# Patient Record
Sex: Female | Born: 1975
Health system: Southern US, Community
[De-identification: ages and names within clinical notes are randomized; demographics above are authoritative.]

## PROBLEM LIST (undated history)

## (undated) ENCOUNTER — Encounter

## (undated) ENCOUNTER — Encounter: Attending: Rheumatology | Primary: Rheumatology

## (undated) ENCOUNTER — Ambulatory Visit: Attending: Family Medicine | Primary: Family Medicine

## (undated) ENCOUNTER — Ambulatory Visit
Payer: MEDICARE | Attending: Student in an Organized Health Care Education/Training Program | Primary: Student in an Organized Health Care Education/Training Program

## (undated) ENCOUNTER — Telehealth

## (undated) ENCOUNTER — Institutional Professional Consult (permissible substitution): Attending: Family Medicine | Primary: Family Medicine

## (undated) ENCOUNTER — Encounter
Attending: Student in an Organized Health Care Education/Training Program | Primary: Student in an Organized Health Care Education/Training Program

## (undated) ENCOUNTER — Encounter: Attending: Family Medicine | Primary: Family Medicine

## (undated) ENCOUNTER — Telehealth
Attending: Student in an Organized Health Care Education/Training Program | Primary: Student in an Organized Health Care Education/Training Program

## (undated) ENCOUNTER — Ambulatory Visit

## (undated) ENCOUNTER — Telehealth: Attending: Ambulatory Care | Primary: Ambulatory Care

## (undated) ENCOUNTER — Encounter: Attending: Sports Medicine | Primary: Sports Medicine

## (undated) ENCOUNTER — Encounter: Attending: Family | Primary: Family

## (undated) ENCOUNTER — Encounter: Attending: Internal Medicine | Primary: Internal Medicine

## (undated) ENCOUNTER — Telehealth: Attending: Rheumatology | Primary: Rheumatology

## (undated) ENCOUNTER — Ambulatory Visit: Attending: Rheumatology | Primary: Rheumatology

## (undated) ENCOUNTER — Ambulatory Visit
Payer: Medicaid (Managed Care) | Attending: Student in an Organized Health Care Education/Training Program | Primary: Student in an Organized Health Care Education/Training Program

## (undated) ENCOUNTER — Ambulatory Visit: Attending: Pharmacist | Primary: Pharmacist

## (undated) ENCOUNTER — Ambulatory Visit: Attending: Orthopaedic Surgery | Primary: Orthopaedic Surgery

## (undated) ENCOUNTER — Ambulatory Visit: Attending: Sports Medicine | Primary: Sports Medicine

## (undated) ENCOUNTER — Ambulatory Visit
Attending: Student in an Organized Health Care Education/Training Program | Primary: Student in an Organized Health Care Education/Training Program

## (undated) DIAGNOSIS — M51369 Other intervertebral disc degeneration, lumbar region without mention of lumbar back pain or lower extremity pain: Secondary | ICD-10-CM

## (undated) DIAGNOSIS — F329 Major depressive disorder, single episode, unspecified: Secondary | ICD-10-CM

## (undated) DIAGNOSIS — Z1589 Genetic susceptibility to other disease: Secondary | ICD-10-CM

## (undated) DIAGNOSIS — M0520 Rheumatoid vasculitis with rheumatoid arthritis of unspecified site: Secondary | ICD-10-CM

## (undated) DIAGNOSIS — E119 Type 2 diabetes mellitus without complications: Secondary | ICD-10-CM

## (undated) DIAGNOSIS — M549 Dorsalgia, unspecified: Secondary | ICD-10-CM

## (undated) DIAGNOSIS — F419 Anxiety disorder, unspecified: Secondary | ICD-10-CM

## (undated) DIAGNOSIS — G8929 Other chronic pain: Secondary | ICD-10-CM

## (undated) DIAGNOSIS — G9332 Myalgic encephalomyelitis/chronic fatigue syndrome: Secondary | ICD-10-CM

## (undated) DIAGNOSIS — M5136 Other intervertebral disc degeneration, lumbar region: Secondary | ICD-10-CM

## (undated) DIAGNOSIS — M1990 Unspecified osteoarthritis, unspecified site: Secondary | ICD-10-CM

## (undated) DIAGNOSIS — M797 Fibromyalgia: Secondary | ICD-10-CM

## (undated) DIAGNOSIS — F32A Depression, unspecified: Secondary | ICD-10-CM

## (undated) DIAGNOSIS — E7212 Methylenetetrahydrofolate reductase deficiency: Secondary | ICD-10-CM

## (undated) DIAGNOSIS — F319 Bipolar disorder, unspecified: Secondary | ICD-10-CM

## (undated) DIAGNOSIS — IMO0001 Reserved for inherently not codable concepts without codable children: Secondary | ICD-10-CM

## (undated) DIAGNOSIS — R5382 Chronic fatigue, unspecified: Secondary | ICD-10-CM

## (undated) HISTORY — DX: Type 2 diabetes mellitus without complications: E11.9

## (undated) HISTORY — PX: TONSILLECTOMY: SUR1361

## (undated) HISTORY — DX: Fibromyalgia: M79.7

## (undated) HISTORY — DX: Chronic fatigue, unspecified: R53.82

## (undated) HISTORY — DX: Reserved for inherently not codable concepts without codable children: IMO0001

## (undated) HISTORY — DX: Myalgic encephalomyelitis/chronic fatigue syndrome: G93.32

## (undated) SURGERY — COLONOSCOPY WITH PROPOFOL
Anesthesia: Monitor Anesthesia Care

---
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
## 2005-03-22 HISTORY — PX: OTHER SURGICAL HISTORY: SHX169

## 2005-04-02 ENCOUNTER — Ambulatory Visit (HOSPITAL_BASED_OUTPATIENT_CLINIC_OR_DEPARTMENT_OTHER): Admission: RE | Admit: 2005-04-02 | Discharge: 2005-04-02 | Payer: Self-pay | Admitting: Orthopedic Surgery

## 2006-10-02 ENCOUNTER — Emergency Department (HOSPITAL_COMMUNITY): Admission: EM | Admit: 2006-10-02 | Discharge: 2006-10-02 | Payer: Self-pay | Admitting: Emergency Medicine

## 2006-10-14 ENCOUNTER — Encounter: Admission: RE | Admit: 2006-10-14 | Discharge: 2006-10-14 | Payer: Self-pay | Admitting: Sports Medicine

## 2007-05-04 ENCOUNTER — Encounter: Admission: RE | Admit: 2007-05-04 | Discharge: 2007-05-04 | Payer: Self-pay | Admitting: Orthopedic Surgery

## 2007-06-22 LAB — CONVERTED CEMR LAB

## 2007-12-20 ENCOUNTER — Encounter: Payer: Self-pay | Admitting: Family Medicine

## 2007-12-20 LAB — CONVERTED CEMR LAB
Cholesterol: 123 mg/dL
HDL: 37 mg/dL
LDL Cholesterol: 71 mg/dL
Triglycerides: 73 mg/dL

## 2008-02-08 ENCOUNTER — Encounter: Payer: Self-pay | Admitting: Family Medicine

## 2008-02-10 ENCOUNTER — Ambulatory Visit: Payer: Self-pay | Admitting: Family Medicine

## 2008-02-10 DIAGNOSIS — IMO0001 Reserved for inherently not codable concepts without codable children: Secondary | ICD-10-CM | POA: Insufficient documentation

## 2008-02-10 DIAGNOSIS — M47816 Spondylosis without myelopathy or radiculopathy, lumbar region: Secondary | ICD-10-CM | POA: Insufficient documentation

## 2008-02-10 DIAGNOSIS — M5137 Other intervertebral disc degeneration, lumbosacral region: Secondary | ICD-10-CM

## 2008-02-10 DIAGNOSIS — R7989 Other specified abnormal findings of blood chemistry: Secondary | ICD-10-CM | POA: Insufficient documentation

## 2008-02-10 DIAGNOSIS — M5136 Other intervertebral disc degeneration, lumbar region: Secondary | ICD-10-CM | POA: Insufficient documentation

## 2008-02-10 DIAGNOSIS — F519 Sleep disorder not due to a substance or known physiological condition, unspecified: Secondary | ICD-10-CM | POA: Insufficient documentation

## 2008-02-10 DIAGNOSIS — M48061 Spinal stenosis, lumbar region without neurogenic claudication: Secondary | ICD-10-CM | POA: Insufficient documentation

## 2008-02-10 DIAGNOSIS — E039 Hypothyroidism, unspecified: Secondary | ICD-10-CM | POA: Insufficient documentation

## 2008-02-11 ENCOUNTER — Telehealth: Payer: Self-pay | Admitting: Family Medicine

## 2008-02-17 ENCOUNTER — Encounter: Payer: Self-pay | Admitting: Family Medicine

## 2008-03-24 ENCOUNTER — Ambulatory Visit: Payer: Self-pay | Admitting: Family Medicine

## 2008-03-24 DIAGNOSIS — J019 Acute sinusitis, unspecified: Secondary | ICD-10-CM | POA: Insufficient documentation

## 2008-05-05 ENCOUNTER — Encounter: Payer: Self-pay | Admitting: Family Medicine

## 2008-05-05 ENCOUNTER — Ambulatory Visit: Payer: Self-pay | Admitting: Family Medicine

## 2008-05-05 DIAGNOSIS — R5381 Other malaise: Secondary | ICD-10-CM | POA: Insufficient documentation

## 2008-05-05 DIAGNOSIS — R5383 Other fatigue: Secondary | ICD-10-CM | POA: Insufficient documentation

## 2008-05-09 LAB — CONVERTED CEMR LAB
CMV IgM: <0.9 (ref <0.90)
Cytomegalovirus Ab-IgG: 4.12 — ABNORMAL HIGH (ref <0.91)
EBV NA IgG: 0.13
EBV VCA IgG: 0.1
EBV VCA IgM: 0.31
Iron: 136 ug/dL (ref 42–145)
Saturation Ratios: 39 % (ref 20–55)
TIBC: 352 ug/dL (ref 250–470)
UIBC: 216 ug/dL
Vit D, 1,25-Dihydroxy: 41 (ref 30–89)

## 2008-05-17 ENCOUNTER — Telehealth: Payer: Self-pay | Admitting: Family Medicine

## 2008-05-24 ENCOUNTER — Emergency Department (HOSPITAL_COMMUNITY): Admission: EM | Admit: 2008-05-24 | Discharge: 2008-05-24 | Payer: Self-pay | Admitting: Emergency Medicine

## 2008-06-05 ENCOUNTER — Ambulatory Visit: Payer: Self-pay | Admitting: Family Medicine

## 2008-06-05 ENCOUNTER — Telehealth: Payer: Self-pay | Admitting: Family Medicine

## 2008-06-05 DIAGNOSIS — L259 Unspecified contact dermatitis, unspecified cause: Secondary | ICD-10-CM | POA: Insufficient documentation

## 2008-07-28 ENCOUNTER — Telehealth: Payer: Self-pay | Admitting: Family Medicine

## 2008-07-28 ENCOUNTER — Ambulatory Visit: Payer: Self-pay | Admitting: Family Medicine

## 2008-07-28 ENCOUNTER — Encounter: Admission: RE | Admit: 2008-07-28 | Discharge: 2008-07-28 | Payer: Self-pay | Admitting: Family Medicine

## 2008-07-28 DIAGNOSIS — M25579 Pain in unspecified ankle and joints of unspecified foot: Secondary | ICD-10-CM | POA: Insufficient documentation

## 2008-07-31 LAB — CONVERTED CEMR LAB: Sed Rate: 3 mm/hr (ref 0–22)

## 2008-09-21 ENCOUNTER — Ambulatory Visit: Payer: Self-pay | Admitting: Family Medicine

## 2008-10-13 ENCOUNTER — Ambulatory Visit: Payer: Self-pay | Admitting: Family Medicine

## 2008-10-13 DIAGNOSIS — M549 Dorsalgia, unspecified: Secondary | ICD-10-CM | POA: Insufficient documentation

## 2008-10-19 ENCOUNTER — Telehealth: Payer: Self-pay | Admitting: Family Medicine

## 2008-12-05 ENCOUNTER — Telehealth: Payer: Self-pay | Admitting: Family Medicine

## 2008-12-19 ENCOUNTER — Telehealth: Payer: Self-pay | Admitting: Family Medicine

## 2009-01-17 ENCOUNTER — Ambulatory Visit: Payer: Self-pay | Admitting: Family Medicine

## 2009-01-17 DIAGNOSIS — J069 Acute upper respiratory infection, unspecified: Secondary | ICD-10-CM | POA: Insufficient documentation

## 2009-01-17 LAB — CONVERTED CEMR LAB
Bilirubin Urine: NEGATIVE
Blood in Urine, dipstick: NEGATIVE
Glucose, Urine, Semiquant: NEGATIVE
Ketones, urine, test strip: NEGATIVE
Nitrite: NEGATIVE
Protein, U semiquant: NEGATIVE
Rapid Strep: NEGATIVE
Specific Gravity, Urine: <1.005
Urobilinogen, UA: NEGATIVE
WBC Urine, dipstick: NEGATIVE
pH: 5

## 2009-01-19 ENCOUNTER — Telehealth: Payer: Self-pay | Admitting: Family Medicine

## 2009-01-24 ENCOUNTER — Telehealth: Payer: Self-pay | Admitting: Family Medicine

## 2009-02-08 ENCOUNTER — Encounter: Payer: Self-pay | Admitting: Family Medicine

## 2009-03-08 ENCOUNTER — Ambulatory Visit: Payer: Self-pay | Admitting: Family Medicine

## 2009-03-28 ENCOUNTER — Ambulatory Visit: Payer: Self-pay | Admitting: Family Medicine

## 2009-03-28 DIAGNOSIS — S91309A Unspecified open wound, unspecified foot, initial encounter: Secondary | ICD-10-CM | POA: Insufficient documentation

## 2009-03-28 LAB — CONVERTED CEMR LAB: Rapid Strep: NEGATIVE

## 2009-03-30 ENCOUNTER — Telehealth: Payer: Self-pay | Admitting: Family Medicine

## 2009-03-30 ENCOUNTER — Encounter: Payer: Self-pay | Admitting: Family Medicine

## 2009-04-02 DIAGNOSIS — A689 Relapsing fever, unspecified: Secondary | ICD-10-CM | POA: Insufficient documentation

## 2009-04-02 LAB — CONVERTED CEMR LAB
Basophils Absolute: 0.1 10*3/uL (ref 0.0–0.1)
Basophils Relative: 3 % — ABNORMAL HIGH (ref 0–1)
Eosinophils Absolute: 0.1 10*3/uL (ref 0.0–0.7)
Eosinophils Relative: 1 % (ref 0–5)
HCT: 40.1 % (ref 36.0–46.0)
Hemoglobin: 13.5 g/dL (ref 12.0–15.0)
Lymphocytes Relative: 53 % — ABNORMAL HIGH (ref 12–46)
Lymphs Abs: 2.1 10*3/uL (ref 0.7–4.0)
MCHC: 33.7 g/dL (ref 30.0–36.0)
MCV: 91.8 fL (ref 78.0–100.0)
Monocytes Absolute: 0.7 10*3/uL (ref 0.1–1.0)
Monocytes Relative: 17 % — ABNORMAL HIGH (ref 3–12)
Neutro Abs: 1 10*3/uL — ABNORMAL LOW (ref 1.7–7.7)
Neutrophils Relative %: 26 % — ABNORMAL LOW (ref 43–77)
Platelets: 122 10*3/uL — ABNORMAL LOW (ref 150–400)
RBC: 4.37 M/uL (ref 3.87–5.11)
RDW: 13.5 % (ref 11.5–15.5)
WBC: 3.9 10*3/uL — ABNORMAL LOW (ref 4.0–10.5)

## 2009-04-03 ENCOUNTER — Encounter: Payer: Self-pay | Admitting: Family Medicine

## 2009-04-03 LAB — CONVERTED CEMR LAB
Basophils Absolute: 0.1 10*3/uL (ref 0.0–0.1)
Basophils Relative: 3 % — ABNORMAL HIGH (ref 0–1)
Bilirubin Urine: NEGATIVE
CMV IgM: 37.8 — ABNORMAL HIGH (ref <30.0)
Eosinophils Absolute: 0 10*3/uL (ref 0.0–0.7)
Eosinophils Relative: 1 % (ref 0–5)
HCT: 38.2 % (ref 36.0–46.0)
Hemoglobin, Urine: NEGATIVE
Hemoglobin: 12.5 g/dL (ref 12.0–15.0)
Ketones, ur: NEGATIVE mg/dL
Leukocytes, UA: NEGATIVE
Lymphocytes Relative: 65 % — ABNORMAL HIGH (ref 12–46)
Lymphs Abs: 2.8 10*3/uL (ref 0.7–4.0)
MCHC: 32.7 g/dL (ref 30.0–36.0)
MCV: 92.7 fL (ref 78.0–100.0)
Monocytes Absolute: 0.5 10*3/uL (ref 0.1–1.0)
Monocytes Relative: 12 % (ref 3–12)
Neutro Abs: 0.8 10*3/uL — ABNORMAL LOW (ref 1.7–7.7)
Neutrophils Relative %: 20 % — ABNORMAL LOW (ref 43–77)
Nitrite: NEGATIVE
Platelets: 149 10*3/uL — ABNORMAL LOW (ref 150–400)
Protein, ur: NEGATIVE mg/dL
RBC: 4.12 M/uL (ref 3.87–5.11)
RDW: 14.1 % (ref 11.5–15.5)
Specific Gravity, Urine: 1.017 (ref 1.005–1.030)
Urine Glucose: NEGATIVE mg/dL
Urobilinogen, UA: 1 (ref 0.0–1.0)
WBC: 4.3 10*3/uL (ref 4.0–10.5)
pH: 6.5 (ref 5.0–8.0)

## 2009-04-20 ENCOUNTER — Encounter: Payer: Self-pay | Admitting: Family Medicine

## 2009-05-18 ENCOUNTER — Telehealth: Payer: Self-pay | Admitting: Family Medicine

## 2009-09-10 ENCOUNTER — Telehealth: Payer: Self-pay | Admitting: Family Medicine

## 2009-10-29 ENCOUNTER — Telehealth: Payer: Self-pay | Admitting: Family Medicine

## 2009-10-31 ENCOUNTER — Encounter: Payer: Self-pay | Admitting: Family Medicine

## 2010-01-02 ENCOUNTER — Telehealth: Payer: Self-pay | Admitting: Family Medicine

## 2010-03-18 ENCOUNTER — Telehealth: Payer: Self-pay | Admitting: Family Medicine

## 2010-06-05 ENCOUNTER — Telehealth: Payer: Self-pay | Admitting: Family Medicine

## 2010-09-10 ENCOUNTER — Telehealth: Payer: Self-pay | Admitting: Family Medicine

## 2010-10-10 ENCOUNTER — Telehealth: Payer: Self-pay | Admitting: Family Medicine

## 2010-11-04 ENCOUNTER — Telehealth: Payer: Self-pay | Admitting: Family Medicine

## 2010-11-13 ENCOUNTER — Encounter: Payer: Self-pay | Admitting: Family Medicine

## 2010-12-22 DIAGNOSIS — IMO0001 Reserved for inherently not codable concepts without codable children: Secondary | ICD-10-CM

## 2010-12-22 HISTORY — DX: Reserved for inherently not codable concepts without codable children: IMO0001

## 2011-01-07 ENCOUNTER — Telehealth: Payer: Self-pay | Admitting: Family Medicine

## 2011-01-21 NOTE — Progress Notes (Signed)
  Phone Note Refill Request Message from:  Patient on March 18, 2010 11:31 AM  Refills Requested: Medication #1:  ADDERALL XR 10 MG XR24H-CAP Take 1-2  tablets by mouth once a day qAM   Supply Requested: 1 month  Method Requested: Pick up at Office Initial call taken by: Kathlene November,  March 18, 2010 11:32 AM    Prescriptions: ADDERALL XR 10 MG XR24H-CAP (AMPHETAMINE-DEXTROAMPHETAMINE) Take 1-2  tablets by mouth once a day qAM  #60 x 0   Entered and Authorized by:   Nani Gasser MD   Signed by:   Nani Gasser MD on 03/18/2010   Method used:   Print then Give to Patient   RxID:   [id number]

## 2011-01-21 NOTE — Progress Notes (Signed)
Summary: Adderall refill request and prior auth needed  Phone Note Refill Request Message from:  Patient on November 04, 2010 4:44 PM  Refills Requested: Medication #1:  ADDERALL XR 10 MG XR24H-CAP Take 1-2  tablets by mouth once a day qAM   Dosage confirmed as above?Dosage Confirmed   Brand Name Necessary? No pt states that she needs a med refill for Adderall and will require a prior Authorization.Call pt at [phone] when this is ready for pick up...Marland KitchenMichaelle Copas  November 04, 2010 4:45 PM   Initial call taken by: Michaelle Copas,  November 04, 2010 4:45 PM  Follow-up for Phone Call        called [phone] and case num is [id number] Follow-up by: Avon Gully CMA, Duncan Dull),  November 12, 2010 11:55 AM  Additional Follow-up for Phone Call Additional follow up Details #1::        PA was denied. called and left message for pt Additional Follow-up by: Avon Gully CMA, Duncan Dull),  November 18, 2010 10:03 AM    Additional Follow-up for Phone Call Additional follow up Details #2::    WE can fill and she can pay out of pocket.  Follow-up by: Nani Gasser MD,  November 18, 2010 10:48 AM

## 2011-01-21 NOTE — Progress Notes (Signed)
Summary: Aderall refill  Phone Note Refill Request Message from:  Patient on October 29, 2009 8:26 AM  Refills Requested: Medication #1:  ADDERALL XR 10 MG XR24H-CAP Take 1-2  tablets by mouth once a day qAM   Supply Requested: 1 month pick up in office this afternoon   Method Requested: Pick up at Office Initial call taken by: Kathlene November,  October 29, 2009 8:26 AM    Prescriptions: ADDERALL XR 10 MG XR24H-CAP (AMPHETAMINE-DEXTROAMPHETAMINE) Take 1-2  tablets by mouth once a day qAM  #60 x 0   Entered and Authorized by:   Nani Gasser MD   Signed by:   Nani Gasser MD on 10/29/2009   Method used:   Print then Give to Patient   RxID:   [id number]

## 2011-01-21 NOTE — Progress Notes (Signed)
Summary: Thinks may have MS  Phone Note Call from Patient Call back at Home Phone [phone]   Caller: Patient Call For: Nani Gasser MD Summary of Call: Pt says she forgot to ask you today at visit about could it be possible that she has MS? Says she has tought about it for a long time just never really addressed. KJ LPN Initial call taken by: Kathlene November,  July 28, 2008 4:29 PM  Follow-up for Phone Call        typically MS causes true weakness that come in goes in certain extremities.  At this point unlikely to have MS.   Follow-up by: Nani Gasser MD,  August 15, 2008 12:56 PM  Additional Follow-up for Phone Call Additional follow up Details #1::        LM for pt to return call. KJ LPN 09/04/77 @ 2:95AO-ZH notified of MD instructions. Pt states needs refills on some meds that originally wrote by another MD instructed pt to have pharmacy send [place] the medication refill. KJ LPN Additional Follow-up by: Kathlene November,  August 15, 2008 3:50 PM

## 2011-01-21 NOTE — Progress Notes (Signed)
Summary: Stiil with fever  Phone Note Call from Patient Call back at Home Phone [phone]   Caller: Patient Call For: Nani Gasser MD Summary of Call: Pt states still running fever at night with bodyaches and said you would get lab work on her. Initial call taken by: Kathlene November,  March 30, 2009 8:48 AM  Follow-up for Phone Call        Can go today for labs.  Follow-up by: Nani Gasser MD,  March 30, 2009 8:53 AM

## 2011-01-21 NOTE — Progress Notes (Signed)
Summary: No better  Phone Note Call from Patient Call back at Home Phone [phone]   Caller: Patient Call For: Meiling Hendriks Summary of Call: Pt seen this week and states throat is not better actual feels worse. [facility] [place]. Was told to call if no better Initial call taken by: Kathlene November,  January 19, 2009 1:21 PM    New/Updated Medications: OMNICEF 300 MG CAPS (CEFDINIR) 1 tab by mouth q 12 hrs x 10 days   Prescriptions: OMNICEF 300 MG CAPS (CEFDINIR) 1 tab by mouth q 12 hrs x 10 days  #20 x 0   Entered and Authorized by:   Seymour Bars DO   Signed by:   Seymour Bars DO on 01/19/2009   Method used:   [facility]ically to        [facility]  [address] [phone]* (retail)       [address] [place], [place]  [address]       Ph: [phone] or [phone]       Fax: [phone]   RxID:   [id number]

## 2011-01-21 NOTE — Progress Notes (Signed)
  Phone Note Outgoing Call   Summary of Call: Call pt and let her know, I reviewed her records and If coordinating care means mostly checking labs or monitoring for medication reactions then we can be of assistance.  I still don't have a avery clear picture of exactly what her physician in [place] may  need from [place] to assist in he care but we will try to accomadating as much as we feel safe to do. Initial call taken by: Nani Gasser MD,  February 11, 2008 12:35 PM  Follow-up for Phone Call        LM on VM to call office back. KJ LPN Follow-up by: Kathlene November,  February 11, 2008 12:57 PM  Additional Follow-up for Phone Call Additional follow up Details #1::        Spoke with pt and gave her MD instructions. Pt wants to know what to do about her bloodwork mainly iron and low cholesterol Additional Follow-up by: Kathlene November,  February 11, 2008 1:36 PM    Additional Follow-up for Phone Call Additional follow up Details #2::    We can recheck iron level in 6 weeks. The cholesterol is good.  The only reason it is low is because her HDL is low and this can be improved with diet and exercise.  Follow-up by: Nani Gasser MD,  February 11, 2008 2:53 PM  Additional Follow-up for Phone Call Additional follow up Details #3:: Details for Additional Follow-up Action Taken: LM forpt to call office back. KJ LPN @ 1:61WR 05/25/53 @ 4:16pm--Pt notified of MD instructions. Will see endocrinologists this Thursday then let us know. KJ LPN Additional Follow-up by: Kathlene November,  February 14, 2008 1:22 PM

## 2011-01-21 NOTE — Consult Note (Signed)
Summary: Rheumatolgy  Rheumatolgy   Imported By: Blanch Media 03/15/2008 09:50:06  _____________________________________________________________________  External Attachment:    Type:   Image     Comment:   External Document

## 2011-01-21 NOTE — Assessment & Plan Note (Signed)
Summary: Sinusitis   Vital Signs:  Patient Profile:   35 Years Old Female Height:     69 inches Weight:      151 pounds O2 Sat:      98 % Temp:     98.2 degrees F oral Pulse rate:   82 / minute BP sitting:   118 / 79  (left arm) Cuff size:   regular  Vitals Entered By: Harlene Salts (March 24, 2008 3:29 PM)                 PCP:  Cipriano Bunker  Chief Complaint:  post nasal drip, headache, sorethroat, body ache and coughing, and URI symptoms.  History of Present Illness:  URI Symptoms      This is a 35 year old woman who presents with URI symptoms.  The symptoms began 3 weeks. ago.  Start withcough adn chest tightness 3 weeks ago. Went to see her allergist. Had normal Peak Flows.  The patient reports nasal congestion, clear nasal discharge, and sore throat, but denies earache.  The patient denies fever.  The patient also reports headache.  Has had some sweats.  No Meds except for those prescribed.  No relief with the Singulair.     Current Allergies: No known allergies       Physical Exam  General:     Well-developed,well-nourished,in no acute distress; alert,appropriate and cooperative throughout examination Head:     Normocephalic and atraumatic without obvious abnormalities. No apparent alopecia or balding. Eyes:     No corneal or conjunctival inflammation noted. EOMI. Perrla. Ears:     External ear exam shows no significant lesions or deformities.  Otoscopic examination reveals clear canals, tympanic membranes are intact bilaterally without bulging, retraction, inflammation or discharge. Hearing is grossly normal bilaterally. Nose:     External nasal examination shows no deformity or inflammation. Nasal mucosa are pink and moist without lesions or exudates. Turbinates are mildy swollen.  Mouth:     Oral mucosa and oropharynx without lesions or exudates.  Teeth in good repair. Neck:     No deformities, masses, or tenderness noted. Lungs:     Normal respiratory  effort, chest expands symmetrically. Lungs are clear to auscultation, no crackles or wheezes. Heart:     Normal rate and regular rhythm. S1 and S2 normal without gallop, murmur, click, rub or other extra sounds.    Impression & Recommendations:  Problem # 1:  SINUSITIS- ACUTE-NOS (ICD-461.9)  Her updated medication list for this problem includes:    Sulfamethoxazole-tmp Ds 800-160 Mg Tab (Trimethoprim-sulfamethoxazole) .[name]... Take 1 tablet by mouth morning and night Instructed on treatment. Call if symptoms persist or worsen.   Complete Medication List: 1)  Etodolac 500 Mg Tabs (Etodolac) .... Take one tablwt by mouth twice a day 2)  Skelaxin 800 Mg Tabs (Metaxalone) .... Take one tablet by mouth once a day 3)  Flexeril 10 Mg Tabs (Cyclobenzaprine hcl) .... Take one tablet by mouth once a day prn 4)  First-testosterone Mc 2 % Crea (Testosterone propionate) 5)  Thyrolar-1/2 30 (6.25-25) Mg (mcg) Tabs (Liotrix (t3-t4)) .... Take 1 tablet by mouth once a day 6)  Sulfamethoxazole-tmp Ds 800-160 Mg Tab (Trimethoprim-sulfamethoxazole) .... Take 1 tablet by mouth morning and night     Prescriptions: SULFAMETHOXAZOLE-TMP DS 800-160 MG TAB (TRIMETHOPRIM-SULFAMETHOXAZOLE) Take 1 tablet by mouth morning and night  #20 x 0   Entered and Authorized by:   Nani Gasser MD   Signed by:  Nani Gasser MD on 03/24/2008   Method used:   [facility] to ...       [facility]  [address] [id number]       [address]       [place], [place]  [address]       Ph: [phone] or [phone]       Fax: [phone]   RxID:   [id number]  ]

## 2011-01-21 NOTE — Progress Notes (Signed)
Summary: Refills  Phone Note Refill Request   Refills Requested: Medication #1:  ADDERALL XR 10 MG XR24H-CAP Take 1-2  tablets by mouth once a day qAM  Medication #2:  CYCLOBENZAPRINE HCL 10 MG TABS 1 at bedtime as needed for sleep and 1/2 tab in AM. Initial call taken by: Payton Spark CMA,  September 10, 2010 12:16 PM    Prescriptions: CYCLOBENZAPRINE HCL 10 MG TABS (CYCLOBENZAPRINE HCL) 1 at bedtime as needed for sleep and 1/2 tab in AM.  #45 Tablet x 0   Entered and Authorized by:   Nani Gasser MD   Signed by:   Nani Gasser MD on 09/10/2010   Method used:   Printed then faxed to ...         RxID:   [id number] ADDERALL XR 10 MG XR24H-CAP (AMPHETAMINE-DEXTROAMPHETAMINE) Take 1-2  tablets by mouth once a day qAM  #60 x 0   Entered and Authorized by:   Nani Gasser MD   Signed by:   Nani Gasser MD on 09/10/2010   Method used:   Printed then faxed to ...         RxID:   [id number]
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
## 2011-01-21 NOTE — Progress Notes (Signed)
Summary: refill  Phone Note Refill Request Message from:  Patient on September 10, 2009 3:17 PM  Refills Requested: Medication #1:  ADDERALL XR 10 MG XR24H-CAP Take 1-2  tablets by mouth once a day qAM   Supply Requested: 1 month  Method Requested: Pick up at Office Initial call taken by: Kathlene November,  September 10, 2009 3:17 PM    Prescriptions: ADDERALL XR 10 MG XR24H-CAP (AMPHETAMINE-DEXTROAMPHETAMINE) Take 1-2  tablets by mouth once a day qAM  #60 x 0   Entered and Authorized by:   Nani Gasser MD   Signed by:   Nani Gasser MD on 09/10/2009   Method used:   Print then Give to Patient   RxID:   [id number]
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
## 2011-01-21 NOTE — Assessment & Plan Note (Signed)
Summary: URI, Urinary frequency   Vital Signs:  Patient Profile:   35 Years Old Female Height:     69 inches Weight:      137 pounds O2 Sat:      98 % Temp:     97.7 degrees F oral Pulse rate:   100 / minute BP sitting:   124 / 74  (left arm) Cuff size:   regular  Vitals Entered By: Harlene Salts (January 17, 2009 1:57 PM)                 PCP:  Nani Gasser MD  Chief Complaint:  swollen gland on right side of neck, white spots on tonsils, headache, body ache, and chest congestion and tightnes times 2weeks .  History of Present Illness: swollen gland on right side of neck,white spots on tonsils, headache, body ache, chest congestion and tightnes times 2 weeks. More pronounced sxs last 2 days. No known sick contacts.  No fever.   + urinary frequency. No  back pain. No dysuria or hematuria.     Current Allergies: No known allergies       Physical Exam  General:     Well-developed,well-nourished,in no acute distress; alert,appropriate and cooperative throughout examination Head:     Normocephalic and atraumatic without obvious abnormalities. No apparent alopecia or balding. Eyes:     No corneal or conjunctival inflammation noted. EOMI. Perrla.  Ears:     External ear exam shows no significant lesions or deformities.  Otoscopic examination reveals clear canals, tympanic membranes are intact bilaterally without bulging, retraction, inflammation or discharge. Hearing is grossly normal bilaterally. Nose:     External nasal examination shows no deformity or inflammation. Mouth:     Oral mucosa and oropharynx without lesions or exudates.  Teeth in good repair. Neck:     No deformities, masses, or tenderness noted. Lungs:     Normal respiratory effort, chest expands symmetrically. Lungs are clear to auscultation, no crackles or wheezes. Heart:     Normal rate and regular rhythm. S1 and S2 normal without gallop, murmur, click, rub or other extra sounds. Skin:    no rashes.   Cervical Nodes:     No lymphadenopathy noted Psych:     Cognition and judgment appear intact. Alert and cooperative with normal attention span and concentration. No apparent delusions, illusions, hallucinations    Impression & Recommendations:  Problem # 1:  URI (ICD-465.9)  Her updated medication list for this problem includes:    Feldene 10 Mg Caps (Piroxicam) .[name]... Take 1 tablet by mouth two times a day as needed Instructed on symptomatic treatment. Call if symptoms persist or worsen.  Orders: Rapid Strep (16109)   Problem # 2:  FREQUENCY, URINARY (ICD-788.41) UA neg. Call me if not feeling better into the early part of the week.  Orders: UA Dipstick w/o Micro (automated)  (81003)   Complete Medication List: 1)  First-testosterone Mc 2 % Crea (Testosterone propionate) 2)  Thyrolar-1/2 30 (6.25-25) Mg (mcg) Tabs (Liotrix (t3-t4)) .... Take 1 tablet by mouth once a day 3)  Singulair 10 Mg Tabs (Montelukast sodium) .... Take 1 tablet by mouth once a day 4)  Veramyst 27.5 Mcg/spray Susp (Fluticasone furoate) .... Two sprays/nostrils daily 5)  Feldene 10 Mg Caps (Piroxicam) .... Take 1 tablet by mouth two times a day as needed 6)  Adderall Xr 10 Mg Xr24h-cap (Amphetamine-dextroamphetamine) .... Take 1-2  tablets by mouth once a day qam 7)  Cyclobenzaprine Hcl 10  Mg Tabs (Cyclobenzaprine hcl) .[name].. 1 at bedtime as needed for sleep and 1/2 tab in am. 8)  Darvocet-n 100 100-650 Mg Tabs (Propoxyphene n-apap) .... Take 1 tablet by mouth two times a day as needed 9)  Amitriptyline Hcl 25 Mg Tabs (Amitriptyline hcl) .... Take 1 tablet by mouth once a day at bedtime    Prescriptions: ADDERALL XR 10 MG XR24H-CAP (AMPHETAMINE-DEXTROAMPHETAMINE) Take 1-2  tablets by mouth once a day qAM  #60 x 0   Entered and Authorized by:   Nani Gasser MD   Signed by:   Nani Gasser MD on 01/17/2009   Method used:   Print then Give to Patient   RxID:   [id number]  ADDERALL XR 10 MG XR24H-CAP (AMPHETAMINE-DEXTROAMPHETAMINE) Take 1 tablet by mouth once a day qAM  #30 x 0   Entered and Authorized by:   Nani Gasser MD   Signed by:   Nani Gasser MD on 01/17/2009   Method used:   Print then Give to Patient   RxID:   [id number]   Laboratory Results   Urine Tests  Date/Time Recieved: January 17, 2009 2:18 PM  Date/Time Reported: January 17, 2009 2:19 PM   Routine Urinalysis   Color: yellow Appearance: Clear Glucose: negative   (Normal Range: Negative) Bilirubin: negative   (Normal Range: Negative) Ketone: negative   (Normal Range: Negative) Spec. Gravity: <1.005   (Normal Range: 1.003-1.035) Blood: negative   (Normal Range: Negative) pH: 5.0   (Normal Range: 5.0-8.0) Protein: negative   (Normal Range: Negative) Urobilinogen: negative   (Normal Range: 0-1) Nitrite: negative   (Normal Range: Negative) Leukocyte Esterace: negative   (Normal Range: Negative)    Date/Time Received: January 17, 2009 2:18 PM  Date/Time Reported: January 17, 2009 2:18 PM   Other Tests  Rapid Strep: negative  Kit Test Internal QC: Negative   (Normal Range: Negative)

## 2011-01-21 NOTE — Progress Notes (Signed)
Summary: refill  Phone Note Refill Request Message from:  Patient on January 02, 2010 12:50 PM  Refills Requested: Medication #1:  ADDERALL XR 10 MG XR24H-CAP Take 1-2  tablets by mouth once a day qAM   Supply Requested: 1 month  Method Requested: Pick up at Office Initial call taken by: Kathlene November,  January 02, 2010 12:51 PM    Prescriptions: ADDERALL XR 10 MG XR24H-CAP (AMPHETAMINE-DEXTROAMPHETAMINE) Take 1-2  tablets by mouth once a day qAM  #60 x 0   Entered and Authorized by:   Nani Gasser MD   Signed by:   Nani Gasser MD on 01/02/2010   Method used:   Print then Give to Patient   RxID:   [id number]

## 2011-01-21 NOTE — Progress Notes (Signed)
Summary: Prior auth for Aderrall  Phone Note Call from Patient   Caller: Patient Call For: Nani Gasser MD Summary of Call: Pt called and states needs prior auth on her Aderrall. Number to call was [phone]  Case ID [id number]. [employer] and said this has been approved from 10/10/2010 to 10/31/2010.   Notified pt this was still approved until Nov 10th nad could not do a new authorization until after then. Initial call taken by: Kathlene November LPN,  October 10, 2010 1:31 PM

## 2011-01-21 NOTE — Letter (Signed)
Summary: [facility]  [facility]   Imported By: Lanelle Bal 04/12/2009 13:52:13  _____________________________________________________________________  External Attachment:    Type:   Image     Comment:   External Document

## 2011-01-21 NOTE — Progress Notes (Signed)
Summary: WANT SUBSTITUTION FOR PIROXICAM  Phone Note Call from Patient Call back at Home Phone [phone]   Caller: Patient Call For: Nani Gasser MD Summary of Call: PATIENT CALLING TO INFORM MD THAT SHE IS VERY SENSITIVE TO MEDS AND SINCE PIROXICAM IS ON BACKORDER AND SAID THAT SHE WANTS SOMETHING THAT DOESN'T MAKE HER MOUTH DRY. HAS BEEN ON TORADOL IN THE PAST.  Initial call taken by: Harlene Salts,  January 24, 2009 3:58 PM  Follow-up for Phone Call        Oral Toradol has many serious risks associated with it.  I can change her to generic Mobic. Follow-up by: Seymour Bars DO,  January 25, 2009 11:59 AM  Additional Follow-up for Phone Call Additional follow up Details #1::        Pt states will hold off on the Mobicv for now Additional Follow-up by: Kathlene November,  January 25, 2009 2:17 PM    New/Updated Medications: MELOXICAM 7.5 MG TABS (MELOXICAM) 1-2 tab by mouth daily with food   Prescriptions: MELOXICAM 7.5 MG TABS (MELOXICAM) 1-2 tab by mouth daily with food  #30 x 0   Entered and Authorized by:   Seymour Bars DO   Signed by:   Kathlene November on 01/25/2009   Method used:   Print then Give to Patient   RxID:   [id number]

## 2011-01-21 NOTE — Progress Notes (Signed)
  Prescriptions: ADDERALL XR 10 MG XR24H-CAP (AMPHETAMINE-DEXTROAMPHETAMINE) Take 1-2  tablets by mouth once a day qAM  #60 x 0   Entered by:   Kathlene November   Authorized by:   Neena Rhymes MD   Signed by:   Kathlene November on 06/05/2010   Method used:   Print then Give to Patient   RxID:   [id number]
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
## 2011-01-21 NOTE — Letter (Signed)
Summary: Old Records   Old Records   Imported By: Blanch Media 03/15/2008 13:52:30  _____________________________________________________________________  External Attachment:    Type:   Image     Comment:   External Document

## 2011-01-21 NOTE — Progress Notes (Signed)
Summary: Adderall refill  Phone Note Call from Patient Call back at Home Phone [phone]   Caller: Patient Call For: Nani Gasser MD Summary of Call: Pt calls and needs refill on the Adderall. States last time she got you were out and Dr. Cathey Endow only gave her #30 and states you usually give her #60. Explained to pt that you were here and you would write Rx as you normally would Initial call taken by: Kathlene November,  December 19, 2008 10:36 AM  Follow-up for Phone Call        Last time I filled Rx was for 30 tabs.   Follow-up by: Nani Gasser MD,  December 19, 2008 10:56 AM       Prescriptions: ADDERALL XR 10 MG XR24H-CAP (AMPHETAMINE-DEXTROAMPHETAMINE) Take 1 tablet by mouth once a day qAM  #30 x 0   Entered by:   Nani Gasser MD   Authorized by:   Kathlene November   Signed by:   Nani Gasser MD on 12/19/2008   Method used:   Print then Give to Patient   RxID:   [id number]

## 2011-01-21 NOTE — Medication Information (Signed)
Summary: Approval for Adderall/[facility]  Approval for Adderall/[facility]   Imported By: Lanelle Bal 11/13/2009 09:33:08  _____________________________________________________________________  External Attachment:    Type:   Image     Comment:   External Document

## 2011-01-21 NOTE — Progress Notes (Signed)
Summary: Dry mouth  Phone Note Call from Patient Call back at Home Phone [phone]   Caller: Patient Call For: Nani Gasser MD Summary of Call: pt calls and wants to know if there is any meds that she is taking that could cause her to have a severe dry mouth and hoarseness. Initial call taken by: Kathlene November,  October 19, 2008 1:04 PM  Follow-up for Phone Call        Flexeril and amitryptiline can both do it, maybe th combo.  Follow-up by: Nani Gasser MD,  October 19, 2008 1:08 PM  Additional Follow-up for Phone Call Additional follow up Details #1::        Pt instrructed of MD instructions. KJ LPN Additional Follow-up by: Kathlene November,  October 19, 2008 1:21 PM

## 2011-01-21 NOTE — Progress Notes (Signed)
Summary: refill  Phone Note Refill Request Message from:  Patient on May 18, 2009 9:30 AM  Refills Requested: Medication #1:  ADDERALL XR 10 MG XR24H-CAP Take 1-2  tablets by mouth once a day qAM call when ready   Method Requested: Pick up at Office Initial call taken by: Kathlene November,  May 18, 2009 9:31 AM      Prescriptions: ADDERALL XR 10 MG XR24H-CAP (AMPHETAMINE-DEXTROAMPHETAMINE) Take 1-2  tablets by mouth once a day qAM  #60 x 0   Entered and Authorized by:   Nani Gasser MD   Signed by:   Nani Gasser MD on 05/18/2009   Method used:   Print then Give to Patient   RxID:   [id number] ADDERALL XR 10 MG XR24H-CAP (AMPHETAMINE-DEXTROAMPHETAMINE) Take 1-2  tablets by mouth once a day qAM  #0 x 0   Entered and Authorized by:   Nani Gasser MD   Signed by:   Nani Gasser MD on 05/18/2009   Method used:   Print then Give to Patient   RxID:   [id number]

## 2011-01-21 NOTE — Medication Information (Signed)
Summary: [facility] for Destroamphetamine-Amph  [facility] for Destroamphetamine-Amph   Imported By: Maryln Gottron 11/28/2010 14:09:32  _____________________________________________________________________  External Attachment:    Type:   Image     Comment:   External Document
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
## 2011-01-28 ENCOUNTER — Encounter: Payer: Self-pay | Admitting: Family Medicine

## 2011-01-29 ENCOUNTER — Encounter: Payer: Self-pay | Admitting: Family Medicine

## 2011-01-29 LAB — CONVERTED CEMR LAB: Pap Smear: NORMAL

## 2011-01-31 ENCOUNTER — Encounter: Payer: Self-pay | Admitting: Family Medicine

## 2011-02-06 NOTE — Miscellaneous (Signed)
Summary: pap  Clinical Lists Changes  Observations: Added new observation of PAP SMEAR: normal (01/29/2011 7:54)      Preventive Care Screening  Pap Smear:    Date:  01/29/2011    Results:  normal

## 2011-02-18 NOTE — Letter (Signed)
Summary: [facility]  [facility]   Imported By: Maryln Gottron 02/10/2011 13:52:03  _____________________________________________________________________  External Attachment:    Type:   Image     Comment:   External Document

## 2011-03-11 NOTE — Letter (Signed)
Summary: Evaluation for Disability Determination/[facility]DS  Evaluation for Disability Determination/DDS   Imported By: Maryln Gottron 03/05/2011 09:08:51  _____________________________________________________________________  External Attachment:    Type:   Image     Comment:   External Document

## 2011-05-09 NOTE — Op Note (Signed)
NAME:  Frances [name], CLOE NO.:  [id number]   MEDICAL RECORD NO.:  [id number]          PATIENT TYPE:  AMB   LOCATION:  [facility]SC                          FACILITY:  [facility]   PHYSICIAN:  Loreta Ave, M.D. 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   OPERATIVE PROCEDURE:  Left ankle exam under anesthesia, arthroscopy, with  debridement of meniscoid lesion, partial synovectomy, and debridement of  thickening anterior talofibular ligament.   SURGEON:  Loreta Ave, M.D.   ASSISTANT:  Genene Churn. Barry Dienes, P.A.-C.   ANESTHESIA:  General with popliteal block.   SPECIMENS:  None.   CULTURES:  None.   COMPLICATIONS:  None.   DRESSINGS:  Soft compressive.   TOURNIQUET TIME:  30 minutes.   PROCEDURE:  The patient was brought to the operating room and after adequate  anesthesia had been obtained, the left ankle was examined.  Full motion, no  bony impingement, no instability.  Stirrup for support and distraction  applied.  Tourniquet applied.  Prepped and draped in the usual sterile  fashion.  Exsanguinated with elevation of Esmarch and tourniquet inflated to  300 mmHg at a thigh tourniquet.  Anterolateral and anteromedial portals of  the ankle avoiding neurovascular structures.  The ankle was entered with the  arthroscope, distended, and inspected. Synovitis globally across the front  of the ankle debrided.  Much more lateral than medial.  Articular surfaces  intact throughout.  Thickened meniscoid lesion and synovitis  anterolaterally  entrapping in the interval between the tibia and talus all debrided.  Also,  some synovitis along the syndesmosis debrided.  Thickening of the anterior  talofibular ligament with synovitis and fibrinous material all cleared from  the lateral gutter to prevent pain and impingement there.  Evidence of  previous cortisone injection into that area with some remaining crystals  within the anterior talofibular ligament.  After thorough debridement of all  recesses, the entire ankle examined including all recesses. Syndesmosis  intact.  Stability, full motion intact.  Articular cartilage intact.  Instruments and fluid had been  removed.  Portals were closed with nylon and injected with Marcaine without  epinephrine.  Sterile compressive dressing applied.  Tourniquet deflated and  removed.  Anesthesia reversed.  Brought to the recovery room. Tolerated the  surgery well without complications.      DFM/MEDQ  D:  04/02/2005  T:  04/02/2005  Job:  [id number]

## 2011-08-08 ENCOUNTER — Ambulatory Visit
Admission: RE | Admit: 2011-08-08 | Discharge: 2011-08-08 | Disposition: A | Discharge status: Home / Self Care | Payer: BC Managed Care – PPO | Source: Ambulatory Visit | Attending: Family Medicine | Admitting: Family Medicine

## 2011-08-08 ENCOUNTER — Ambulatory Visit (INDEPENDENT_AMBULATORY_CARE_PROVIDER_SITE_OTHER): Payer: BC Managed Care – PPO | Admitting: Family Medicine

## 2011-08-08 ENCOUNTER — Telehealth: Payer: Self-pay | Admitting: Family Medicine

## 2011-08-08 VITALS — BP 122/82 | HR 103 | Wt 156.0 lb

## 2011-08-08 DIAGNOSIS — M79643 Pain in unspecified hand: Secondary | ICD-10-CM

## 2011-08-08 DIAGNOSIS — M79609 Pain in unspecified limb: Secondary | ICD-10-CM

## 2011-08-08 DIAGNOSIS — Z23 Encounter for immunization: Secondary | ICD-10-CM

## 2011-08-08 MED ORDER — CYCLOBENZAPRINE HCL 10 MG PO TABS: 10.0000 mg | ORAL_TABLET | Freq: Three times a day (TID) | ORAL | Status: AC | PRN

## 2011-08-08 NOTE — Telephone Encounter (Signed)
Call pt: all the joints appear normal in the hand. May be some mild arthritis on tendonsitis but not enough arthriti to show up on xray. Recommen treat with anti-inflammatory which she already takes on a daily basis. Will call when we get the lab work.

## 2011-08-08 NOTE — Patient Instructions (Signed)
We will call you with the lab and x-ray results. 

## 2011-08-08 NOTE — Progress Notes (Signed)
  Subjective:    Patient ID: Frances [name], female    DOB: 07-09-1976, 35 y.o.   MRN: [id number]  HPI RT hand hurting for bout 3 weeks.  PIP on the 2nd and 3rd.  No swelling or redness.  Hurts to straighten her fingers or make a fist.  No family hx of RA, cousin with SLE.  Occ shooting pain in the joints. Takes a daiy NSAID anyway for her back.  Says her other hand joints hurt at times too.  Did have an old fracture on the right hand third digit PIP as well. She takes a daily inflammatory for her back and says she feels this helps her joint pain some. Her hands are better at rest.  Wants refill on her flexeril.   Review of Systems     Objective:   Physical Exam  Musculoskeletal:       Her hands visually appear normal with no swelling or redness over any of the joints. She has some slight tenderness over the PIP joint on the right third finger. No deformity or apparent trauma. Normal flexion and extension of all the fingers. Normal strength of all digits on the right hand.      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 They're we will check a sedimentation rate and rheumatoid factor today they're likely to be normal. We will also get plain film x-rays to see if there any degenerative changes that are significant. For the meantime she can certainly use her anti-inflammatory which he is already using for her back. We will call her with the results.  She was given a flu vaccine today.

## 2011-08-09 LAB — RHEUMATOID FACTOR: Rhuematoid fact SerPl-aCnc: <10 IU/mL (ref <=14)

## 2011-08-09 LAB — SEDIMENTATION RATE: Sed Rate: 4 mm/hr (ref 0–22)

## 2011-08-10 ENCOUNTER — Telehealth: Payer: Self-pay | Admitting: Family Medicine

## 2011-08-10 NOTE — Telephone Encounter (Signed)
Call pt: Sed rate and rheumatoid factor is neg for the joints.  So likely OA.  Recommend gentle hand strengthening and tyelnol or aleve or IBU.

## 2011-08-12 ENCOUNTER — Encounter: Payer: Self-pay | Admitting: Family Medicine

## 2011-08-12 NOTE — Telephone Encounter (Signed)
Pt.notified

## 2011-08-12 NOTE — Telephone Encounter (Signed)
Left message on vm

## 2011-10-15 ENCOUNTER — Encounter: Payer: Self-pay | Admitting: Emergency Medicine

## 2011-10-15 ENCOUNTER — Inpatient Hospital Stay (INDEPENDENT_AMBULATORY_CARE_PROVIDER_SITE_OTHER)
Admission: RE | Admit: 2011-10-15 | Discharge: 2011-10-15 | Disposition: A | Discharge status: Home / Self Care | Payer: BC Managed Care – PPO | Source: Ambulatory Visit | Attending: Emergency Medicine | Admitting: Emergency Medicine

## 2011-10-15 DIAGNOSIS — R0602 Shortness of breath: Secondary | ICD-10-CM

## 2011-10-15 DIAGNOSIS — J069 Acute upper respiratory infection, unspecified: Secondary | ICD-10-CM

## 2011-11-17 ENCOUNTER — Emergency Department
Admission: EM | Admit: 2011-11-17 | Discharge: 2011-11-17 | Disposition: A | Discharge status: Home / Self Care | Payer: BC Managed Care – PPO | Source: Home / Self Care | Attending: Emergency Medicine | Admitting: Emergency Medicine

## 2011-11-17 ENCOUNTER — Encounter: Payer: Self-pay | Admitting: *Deleted

## 2011-11-17 DIAGNOSIS — M79676 Pain in unspecified toe(s): Secondary | ICD-10-CM

## 2011-11-17 DIAGNOSIS — IMO0002 Reserved for concepts with insufficient information to code with codable children: Secondary | ICD-10-CM

## 2011-11-17 DIAGNOSIS — M79609 Pain in unspecified limb: Secondary | ICD-10-CM

## 2011-11-17 HISTORY — DX: Unspecified osteoarthritis, unspecified site: M19.90

## 2011-11-17 HISTORY — DX: Other chronic pain: G89.29

## 2011-11-17 HISTORY — DX: Dorsalgia, unspecified: M54.9

## 2011-11-17 MED ORDER — SULFAMETHOXAZOLE-TMP DS 800-160 MG PO TABS: 1.0000 | ORAL_TABLET | Freq: Two times a day (BID) | ORAL | Status: AC

## 2011-11-17 NOTE — ED Provider Notes (Signed)
History     CSN: [id number] Arrival date & time: 11/17/2011 10:20 AM   First MD Initiated Contact with Patient 11/17/11 1030      Chief Complaint  Patient presents with  . Toe Pain    (Consider location/radiation/quality/duration/timing/severity/associated sxs/prior treatment) HPI This patient presents today with left great toe redness and pain of the last few days. She has had something like this in the past and ingrown toenail but has never been this bad. She does: The current trauma or other injury. It feels red swollen and is painful. There has not been any drainage. She has been keeping it covered with Band-Aid and Neosporin ointment.  Past Medical History  Diagnosis Date  . Fibromyalgia   . IUD   . Arthritis   . Back pain, chronic     Past Surgical History  Procedure Date  . Left knee lateral release 1993  . Scar tisue removal 03/2005    left ankle  . Tonsillectomy     Family History  Problem Relation Age of Onset  . Alcohol abuse Paternal Uncle   . Cancer Maternal Grandmother     colon  . Depression Maternal Aunt   . Depression Maternal Uncle   . Diabetes Mother   . Stroke Mother   . Lupus Mother     History  Substance Use Topics  . Smoking status: Never Smoker   . Smokeless tobacco: Not on file  . Alcohol Use: No    OB History    Grav Para Term Preterm Abortions TAB SAB Ect Mult Living                  Review of Systems  Allergies  Skelaxin  Home Medications   Current Outpatient Rx  Name Route Sig Dispense Refill  . HYDROCODONE-ACETAMINOPHEN 5-325 MG PO TABS Oral Take 1 tablet by mouth every 6 (six) hours as needed.      . CYCLOBENZAPRINE HCL 10 MG PO TABS Oral Take 1 tablet (10 mg total) by mouth every 8 (eight) hours as needed for muscle spasms. 30 tablet 1  . SULFAMETHOXAZOLE-TMP DS 800-160 MG PO TABS Oral Take 1 tablet by mouth 2 (two) times daily. 14 tablet 0  . SULINDAC 200 MG PO TABS Oral Take 200 mg by mouth 2 (two) times daily.         BP 114/75  Pulse 84  Temp(Src) 98.3 F (36.8 C) (Oral)  Resp 18  Ht 5\' 8"  (1.727 m)  Wt 164 lb 4 oz (74.503 kg)  BMI 24.97 kg/m2  SpO2 99%  Physical Exam  Nursing note and vitals reviewed. Constitutional: She is oriented to person, place, and time. She appears well-developed and well-nourished.  HENT:  Head: Normocephalic and atraumatic.  Neck: Neck supple.  Cardiovascular: Regular rhythm and normal heart sounds.   Pulmonary/Chest: Effort normal and breath sounds normal. No respiratory distress.  Neurological: She is alert and oriented to person, place, and time.  Skin: Skin is warm and dry.       L great toe with redness, swelling, tenderness next to the nail.  No active drainage or bleeding.  Psychiatric: She has a normal mood and affect. Her speech is normal.    ED Course  Procedures (including critical care time)  Labs Reviewed - No data to display No results found.   1. Paronychia   2. Toe pain       MDM   She has a paronychial infection of the left great toe.  I have given her prescription for Bactrim DS that she is going to take for the next week. I also advised that she needs to be soaking this in warm water bath multiple times a day to make sure that she is encouraging drainage. I do not feel that it needs an I&D today however the next few days he may require he needle drainage of the area. I told her to follow up with primary care doctor or podiatrist if she is getting worse.   Lily Kocher, MD 11/17/11 1034

## 2011-11-17 NOTE — ED Notes (Signed)
Pt c/o LT great toe infected toe nail x 1 wk. No fever. She has applied antibiotic oint and soaked in [facility].
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
## 2012-09-14 ENCOUNTER — Encounter: Payer: Self-pay | Admitting: Family Medicine

## 2012-09-14 ENCOUNTER — Ambulatory Visit (INDEPENDENT_AMBULATORY_CARE_PROVIDER_SITE_OTHER): Payer: BC Managed Care – PPO | Admitting: Family Medicine

## 2012-09-14 VITALS — BP 121/77 | HR 72 | Wt 166.0 lb

## 2012-09-14 DIAGNOSIS — IMO0001 Reserved for inherently not codable concepts without codable children: Secondary | ICD-10-CM

## 2012-09-14 DIAGNOSIS — J329 Chronic sinusitis, unspecified: Secondary | ICD-10-CM

## 2012-09-14 DIAGNOSIS — Z5181 Encounter for therapeutic drug level monitoring: Secondary | ICD-10-CM

## 2012-09-14 DIAGNOSIS — M797 Fibromyalgia: Secondary | ICD-10-CM

## 2012-09-14 DIAGNOSIS — D239 Other benign neoplasm of skin, unspecified: Secondary | ICD-10-CM

## 2012-09-14 MED ORDER — DULOXETINE HCL 30 MG PO CPEP: 30.0000 mg | ORAL_CAPSULE | Freq: Two times a day (BID) | ORAL | Status: DC

## 2012-09-14 MED ORDER — AMOXICILLIN-POT CLAVULANATE 875-125 MG PO TABS: 1.0000 | ORAL_TABLET | Freq: Two times a day (BID) | ORAL | Status: DC

## 2012-09-14 NOTE — Progress Notes (Signed)
  Subjective:    Patient ID: Frances [name], female    DOB: 1976/05/19, 36 y.o.   MRN: [id number]  HPI 1 week of nasal congestion and chest congestion. + post nasal drip. No fever.  Not on any meds right now. No ear itching or throat. Mild scratchy throat.  HA and bodyaches in the evening. No facial pain.   Bump on her right upper thigh. Has been there for years. She's is very difficult to see. Not otherwise bothersome.  Fibromyalgia - I gave her a sample of medication a year or so ago. She can remove the name of but says it was also for depression. She says she think she would like to try. She never actually tried the samples that I gave her before. She noticed that she's been apathetic and not wanting to get out and do things.   Review of Systems     Objective:   Physical Exam  Constitutional: She is oriented to person, place, and time. She appears well-developed and well-nourished.  HENT:  Head: Normocephalic and atraumatic.  Right Ear: External ear normal.  Left Ear: External ear normal.  Nose: Nose normal.  Mouth/Throat: Oropharynx is clear and moist.       TMs and canals are clear.   Eyes: Conjunctivae normal and EOM are normal. Pupils are equal, round, and reactive to light.  Neck: Neck supple. No thyromegaly present.  Cardiovascular: Normal rate, regular rhythm and normal heart sounds.   Pulmonary/Chest: Effort normal and breath sounds normal. She has no wheezes.  Lymphadenopathy:    She has no cervical adenopathy.  Neurological: She is alert and oriented to person, place, and time.  Skin: Skin is warm and dry.       On her right upper posterior thigh she has a pinkish firm superficial papule consistent with dermatofibroma.  Psychiatric: She has a normal mood and affect.          Assessment & Plan:  Sinusitis /bronchitis - likely viral but she has been sick for a week and feels like she's getting worse so go ahead and put her on Augmentin. Call if not getting  better or suddenly gets worse. Symptomatic care reviewed. Handout given.  Fibromyalgia - I. be happy to give her samples of Cymbalta 30 mg again. Recommend try for least 2 weeks. I did review potential symptoms that she makes parents initially.F/u in 2 months if tolerates it well. Can call if would like a rx sent to the pharmacy.    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 Keep an eye on it and if it changes then please let me know.

## 2012-09-14 NOTE — Patient Instructions (Signed)

## 2012-09-15 LAB — COMPLETE METABOLIC PANEL WITH GFR
ALT: 14 U/L (ref 0–35)
AST: 16 U/L (ref 0–37)
Albumin: 4.4 g/dL (ref 3.5–5.2)
Alkaline Phosphatase: 65 U/L (ref 39–117)
BUN: 16 mg/dL (ref 6–23)
CO2: 29 mEq/L (ref 19–32)
Calcium: 9.3 mg/dL (ref 8.4–10.5)
Chloride: 105 mEq/L (ref 96–112)
Creat: 0.71 mg/dL (ref 0.50–1.10)
GFR, Est African American: >89 mL/min
GFR, Est Non African American: >89 mL/min
Glucose, Bld: 90 mg/dL (ref 70–99)
Potassium: 4.2 mEq/L (ref 3.5–5.3)
Sodium: 140 mEq/L (ref 135–145)
Total Bilirubin: 1.4 mg/dL — ABNORMAL HIGH (ref 0.3–1.2)
Total Protein: 6.7 g/dL (ref 6.0–8.3)

## 2012-09-15 LAB — TSH: TSH: 0.967 u[IU]/mL (ref 0.350–4.500)

## 2012-09-23 ENCOUNTER — Telehealth: Payer: Self-pay | Admitting: *Deleted

## 2012-09-23 MED ORDER — AZITHROMYCIN 250 MG PO TABS: ORAL_TABLET | ORAL | Status: DC

## 2012-09-23 NOTE — Telephone Encounter (Signed)
Pt states she has been on abx for 9 days and it has caused diarrhea and upset stomach. States she still has drainage and chest tightness. Would like to know if she needs anymore abx or not.

## 2012-09-23 NOTE — Telephone Encounter (Signed)
Ok to stop ABX since only has one day left ( 10 days rx).  I can send over another abx to see if clears it up. New rx sent.

## 2012-09-23 NOTE — Telephone Encounter (Signed)
Pt informed

## 2012-10-12 ENCOUNTER — Telehealth: Payer: Self-pay | Admitting: *Deleted

## 2012-10-12 NOTE — Telephone Encounter (Signed)
Can start with once a day for awhile until body gets used to it or we could actually: It does actually come in a little bit lower dose than 30 mg. It comes in a 20 mg. She might tolerate that better. Let me know what she would like to do.

## 2012-10-12 NOTE — Telephone Encounter (Signed)
Pt calls and the Cymbalta you put her on she has tried for 4 weeks and is causing severe diarrhea. Has stopped the med and was [place] try it again but wanted to check with you first

## 2012-10-12 NOTE — Telephone Encounter (Signed)
Pt informed of instructions- pt declines the Cymbalta 20mg  and will let you know if decides otherwise

## 2012-11-15 ENCOUNTER — Ambulatory Visit (INDEPENDENT_AMBULATORY_CARE_PROVIDER_SITE_OTHER): Payer: BC Managed Care – PPO | Admitting: Family Medicine

## 2012-11-15 ENCOUNTER — Encounter: Payer: Self-pay | Admitting: Family Medicine

## 2012-11-15 VITALS — BP 123/78 | HR 84 | Temp 98.2°F | Wt 165.0 lb

## 2012-11-15 DIAGNOSIS — W540XXA Bitten by dog, initial encounter: Secondary | ICD-10-CM

## 2012-11-15 DIAGNOSIS — S81009A Unspecified open wound, unspecified knee, initial encounter: Secondary | ICD-10-CM

## 2012-11-15 DIAGNOSIS — S91009A Unspecified open wound, unspecified ankle, initial encounter: Secondary | ICD-10-CM

## 2012-11-15 MED ORDER — CLINDAMYCIN HCL 300 MG PO CAPS: 300.0000 mg | ORAL_CAPSULE | Freq: Four times a day (QID) | ORAL | Status: DC

## 2012-11-15 MED ORDER — CIPROFLOXACIN HCL 250 MG PO TABS: ORAL_TABLET | ORAL | Status: AC

## 2012-11-15 NOTE — Progress Notes (Signed)
CC: Frances [name] is a 36 y.o. female is here for Animal Bite   Subjective: HPI: Patient was bit by a 71-week-old puppy yesterday evening while the dog was roughhousing with an older dog. She presents due to concerns of how she should manage the wound. She flushed the wound with iodine and water 30-60 minutes after the bite. She's been covering it with sterile gauze and noting small amounts of drainage, described as somewhat dark. She denies pain or swelling nor redness in the affected extremity near or remote to the bite area. She denies fevers, chills, swollen lymph nodes, trouble walking, flushing, nausea, rapid heartbeat. She had a tetanus booster one year ago. The dog is a Soil scientist.   Review Of Systems Outlined In HPI  Past Medical History  Diagnosis Date  . Fibromyalgia   . IUD   . Arthritis   . Back pain, chronic      Family History  Problem Relation Age of Onset  . Alcohol abuse Paternal Uncle   . Cancer Maternal Grandmother     colon  . Depression Maternal Aunt   . Depression Maternal Uncle   . Diabetes Mother   . Stroke Mother   . Lupus Mother      History  Substance Use Topics  . Smoking status: Never Smoker   . Smokeless tobacco: Not on file  . Alcohol Use: No     Objective: Filed Vitals:   11/15/12 1135  BP: 123/78  Pulse: 84  Temp: 98.2 F (36.8 C)    General: Alert and Oriented, No Acute Distress Lungs: Comfortable work of breathing Cardiac: Regular rate and rhythm Extremities: No peripheral edema.  Strong peripheral pulses. On the right lateral shin there are 2 puncture wounds that are scabbed over and appear clean and dry without discharge, no tenderness trace erythema at the bite site but none otherwise   Assessment & Plan: Frances [name] was seen today for animal bite.  Diagnoses and associated orders for this visit:  Dog bite - ciprofloxacin (CIPRO) 250 MG tablet; Take one by mouth twice a day for 10 days. - clindamycin (CLEOCIN) 300  MG capsule; Take 1 capsule (300 mg total) by mouth 4 (four) times daily.  Other Orders - ETODOLAC PO; Take by mouth.    No sign of active infection, discussed with the patient that [facility] guide estimates 5% risk of becoming infected after a dog bite. We'll provide antibiotics it signs and symptoms suggestive of cellulitis or infection occur.  She reports diarrhea with Augmentin, she would like an alternative regimen.Signs and symptoms requring emergent/urgent reevaluation were discussed with the patient. Keep site clean and dry and covered.  Return if symptoms worsen or fail to improve.

## 2015-06-26 ENCOUNTER — Other Ambulatory Visit (HOSPITAL_COMMUNITY): Payer: Self-pay | Admitting: *Deleted

## 2015-06-26 DIAGNOSIS — N6320 Unspecified lump in the left breast, unspecified quadrant: Secondary | ICD-10-CM

## 2015-06-28 ENCOUNTER — Ambulatory Visit: Payer: Self-pay

## 2015-06-28 ENCOUNTER — Ambulatory Visit (HOSPITAL_COMMUNITY)
Admission: RE | Admit: 2015-06-28 | Discharge: 2015-06-28 | Disposition: A | Discharge status: Home / Self Care | Payer: Self-pay | Source: Ambulatory Visit | Attending: Obstetrics and Gynecology | Admitting: Obstetrics and Gynecology

## 2015-06-28 ENCOUNTER — Encounter (HOSPITAL_COMMUNITY): Payer: Self-pay

## 2015-06-28 VITALS — BP 102/68 | Temp 98.4°F | Ht 66.0 in | Wt 157.0 lb

## 2015-06-28 DIAGNOSIS — N6325 Unspecified lump in the left breast, overlapping quadrants: Secondary | ICD-10-CM

## 2015-06-28 DIAGNOSIS — N6320 Unspecified lump in the left breast, unspecified quadrant: Secondary | ICD-10-CM

## 2015-06-28 DIAGNOSIS — Z1239 Encounter for other screening for malignant neoplasm of breast: Secondary | ICD-10-CM

## 2015-06-28 HISTORY — DX: Major depressive disorder, single episode, unspecified: F32.9

## 2015-06-28 HISTORY — DX: Depression, unspecified: F32.A

## 2015-06-28 NOTE — Progress Notes (Signed)
CLINIC:  [facility]) Clinic  REASON FOR VISIT: Well-woman exam and diagnostic mammogram.    HISTORY OF PRESENT ILLNESS:  Frances [name] is a 39 y.o. female who presents to the [facility] today for clinical breast exam. No family history of breast cancer. Today, she reports a left breast lump that has been there for about 5 years.  There is no associated breast pain, nipple discharge, or nipple inversion.  The patient feels that the skin on the left breast "has a little different color than it used to."  She was referred from [facility] for evaluation of this left breast lump today. Her last pap smear was in 10/2013 and was negative.  She reports having cryotherapy in her 59s.   REVIEW OF SYSTEMS:  Left breast lump per HPI. Denies any breast pain, nipple inversion, or nipple discharge bilaterally.   ALLERGIES: Allergies  Allergen Reactions  . Metaxalone   . Skelaxin     CURRENT MEDICATIONS:  Current Outpatient Prescriptions on File Prior to Encounter  Medication Sig Dispense Refill  . ETODOLAC PO Take by mouth.    [name] HYDROcodone-acetaminophen (NORCO) 5-325 MG per tablet Take 1 tablet by mouth every 6 (six) hours as needed.      . clindamycin (CLEOCIN) 300 MG capsule Take 1 capsule (300 mg total) by mouth 4 (four) times daily. (Patient not taking: Reported on 06/28/2015) 40 capsule 0   No current facility-administered medications on file prior to encounter.     PHYSICAL EXAM:  Vitals:  Filed Vitals:   06/28/15 1156  BP: 102/68  Temp: 98.4 F (36.9 C)   General: Well-nourished, well-appearing female in no acute distress.  She is unaccompanied in clinic today.  Frances Infante, LPN was present during physical exam for this patient.  Breasts: Bilateral breasts exposed and observed with patient standing (arms at side, arms on hips, arms on hips flexed forward, and arms over head).  No gross abnormalities including breast skin  puckering or dimpling noted on observation.  Left breast significantly larger than right (normal per patient). Breasts without evidence of skin redness, thickening, or peau d'orange appearance. No nipple retraction or nipple discharge noted bilaterally.   -Left breast: Round nodularity at 3 o'clock position, about 2.5cm from nipple.  The nodularity measures about 0.5 x 0.5cm.  Normal fibrocystic breast changes noted in bilateral breasts. -Right breast: No palpable abnormalities. Axillary lymph nodes: No axillary lymphadenopathy bilaterally.   GU: Exam deferred. Pap smear is up-to-date.  ASSESSMENT & PLAN:   1. Breast cancer screening: Frances [name] has no palpable breast abnormalities on her clinical breast exam today.  She will receive her diagnostic mammogram as scheduled.  She will be contacted by the imaging center for results of the mammogram.  She was given instructions and educational materials regarding breast self-awareness. Frances [name] is aware of this plan and agrees with it.    Frances [name] was encouraged to ask questions and all questions were answered to her satisfaction.    Mike Craze, NP [place]  [phone]

## 2015-07-02 ENCOUNTER — Ambulatory Visit
Admission: RE | Admit: 2015-07-02 | Discharge: 2015-07-02 | Disposition: A | Discharge status: Home / Self Care | Payer: No Typology Code available for payment source | Source: Ambulatory Visit | Attending: Obstetrics and Gynecology | Admitting: Obstetrics and Gynecology

## 2015-07-02 DIAGNOSIS — N6320 Unspecified lump in the left breast, unspecified quadrant: Secondary | ICD-10-CM

## 2015-11-06 ENCOUNTER — Encounter: Payer: Self-pay | Admitting: Internal Medicine

## 2015-11-06 ENCOUNTER — Ambulatory Visit (INDEPENDENT_AMBULATORY_CARE_PROVIDER_SITE_OTHER): Payer: Self-pay | Admitting: Internal Medicine

## 2015-11-06 VITALS — BP 124/80 | HR 76 | Ht 65.5 in | Wt 161.0 lb

## 2015-11-06 DIAGNOSIS — B354 Tinea corporis: Secondary | ICD-10-CM

## 2015-11-06 MED ORDER — TRIAMCINOLONE ACETONIDE 0.1 % EX CREA: 1.0000 "application " | TOPICAL_CREAM | Freq: Two times a day (BID) | CUTANEOUS | Status: DC

## 2015-11-06 NOTE — Patient Instructions (Signed)
Leave you great toenails alone--no digging, no cutting.  You can cut the others. Use Lamisil generic is Terbinafine twice daily to the lesion on the back of your leg along with the Triamcinolone cream I prescribe until the lesion goes away.

## 2015-11-06 NOTE — Progress Notes (Signed)
   Subjective:    Patient ID: Frances [name], female    DOB: Jun 18, 1976, 39 y.o.   MRN: [id number]  HPI  1.  Ringworm:  Pt. States the ringworm lesion on back of left leg never completely went away with treatment.  Had a couple of pinpoints of red when finished treatment.  Cannot say when it started getting bigger.  THinks it was definitely more of a problem 2 months ago.    2.  Toenails  Of great toes are separating from skin.  No discoloration.  No definite thickening or crumbling.      Review of Systems     Objective:   Physical Exam  Posterior left thigh: Dime sized area with erythematous base with flaking overlying.  No elevation of edges.  A bit of central clearing.    Great toenails with central dip of nail edge toward base.  Right great toenail with thickening underneath, but patient has cut away the involved portion of nail. Left great toenail with whitened distal edge and perhaps slight separation.  Difficult to see abnormality as she has cut the nail away as well.  No surrounding soft tissue erythema.      Assessment & Plan:

## 2016-01-08 ENCOUNTER — Ambulatory Visit (INDEPENDENT_AMBULATORY_CARE_PROVIDER_SITE_OTHER): Payer: Self-pay | Admitting: Internal Medicine

## 2016-01-08 VITALS — BP 130/70 | HR 84 | Resp 12 | Ht 65.5 in | Wt 165.0 lb

## 2016-01-08 DIAGNOSIS — L858 Other specified epidermal thickening: Secondary | ICD-10-CM

## 2016-01-08 DIAGNOSIS — B351 Tinea unguium: Secondary | ICD-10-CM

## 2016-01-08 DIAGNOSIS — J019 Acute sinusitis, unspecified: Secondary | ICD-10-CM

## 2016-01-08 DIAGNOSIS — B354 Tinea corporis: Secondary | ICD-10-CM

## 2016-01-08 DIAGNOSIS — Q829 Congenital malformation of skin, unspecified: Secondary | ICD-10-CM

## 2016-01-08 DIAGNOSIS — Z79899 Other long term (current) drug therapy: Secondary | ICD-10-CM

## 2016-01-08 MED ORDER — TERBINAFINE HCL 250 MG PO TABS: 250.0000 mg | ORAL_TABLET | Freq: Every day | ORAL | Status: DC

## 2016-01-08 MED ORDER — AMOXICILLIN 500 MG PO CAPS: ORAL_CAPSULE | ORAL | Status: DC

## 2016-01-08 NOTE — Patient Instructions (Signed)
Eucerin Cream:  Eczema Relief--use twice daily, especially right after bathing.

## 2016-01-08 NOTE — Progress Notes (Signed)
   Subjective:    Patient ID: Frances [name], female    DOB: 07/29/1976, 40 y.o.   MRN: [id number]  HPI  1.  Tinea  [place]:  Lesion now down to just the two bumps that seem to be around hair follicles, but no hair grows out.  Utilized the Terbinafine topical and the triamcinolone cream twice daily for 2 weeks, and then continued once daily for 4 more weeks.   2.  Left great toenail:  Thickened and separating from skin with white edge and yellow discoloration deeper into nail   3.  Congestion and posterior pharyngeal drainage.  No fever.  Pain around eyes and face.  Has had for weeks now.    Review of Systems     Objective:   Physical Exam   HEENT:  Tender over frontal and maxillary areas, TMs pearly gray, nasal mucosa mildly inflammed, throat with some cobbling but no exudate Neck:  Supple, No adenopathy Chest:  CTA CV:  RRR without murmur or rub Abd: S, NT, No HSM or mass, +BS Skin colored and mildly red bumps scattered on upper outer arms bilaterally Dorsal aspect of great toes with some flaking on mildly red base  Left posterior thigh:  2 pinpoint  lesions with dry white tips surrounded by thin rim of erythema Right great toenail with distal aspect having thickening and white crumbling in deeper layers, yellow discoloration as the discoloration goes more proximally.  Left great toenail with some white at distal edge that looks a bit crumbly, but not thickening--may be just dry.       Assessment & Plan:  1.  Keratosis pilaris:  Recommend Triamcinolone cream to these areas followed by Eucerin cream with eczema relief, especially after bathing.  2.  Tinea corporis:  The area in question on back of left thigh does not look like tinea at this point--Pt. Seems very focused on this area.  Likely related to #1 instead  3.  Onychomycosis:  To start Terbinafine 250 mg for 90 day treatment after finishing antibiotic  Baseline CMP today.  Flaking on top of great toes may be due to  Tinea or eczema--may use Triamcinolone cream here as well once starts Terbinafine.  4.  Acute Sinusitis:  Cannot afford Z pack--Amoxicillin

## 2016-01-09 LAB — COMPREHENSIVE METABOLIC PANEL
ALT: 31 IU/L (ref 0–32)
AST: 22 IU/L (ref 0–40)
Albumin/Globulin Ratio: 1.9 (ref 1.1–2.5)
Albumin: 4.4 g/dL (ref 3.5–5.5)
Alkaline Phosphatase: 63 IU/L (ref 39–117)
BUN/Creatinine Ratio: 21 (ref 9–23)
BUN: 15 mg/dL (ref 6–24)
Bilirubin Total: 0.6 mg/dL (ref 0.0–1.2)
CO2: 24 mmol/L (ref 18–29)
Calcium: 9.2 mg/dL (ref 8.7–10.2)
Chloride: 103 mmol/L (ref 96–106)
Creatinine, Ser: 0.72 mg/dL (ref 0.57–1.00)
GFR calc Af Amer: 120 mL/min/{1.73_m2} (ref >59)
GFR calc non Af Amer: 104 mL/min/{1.73_m2} (ref >59)
Globulin, Total: 2.3 g/dL (ref 1.5–4.5)
Glucose: 129 mg/dL — ABNORMAL HIGH (ref 65–99)
Potassium: 4.4 mmol/L (ref 3.5–5.2)
Sodium: 140 mmol/L (ref 134–144)
Total Protein: 6.7 g/dL (ref 6.0–8.5)

## 2016-01-16 NOTE — Progress Notes (Signed)
Quick Note:  Patient states she had a peppermint in her mouth when she had the blood drawn and states she doesn't remember what she ate before she came in for her appointment since that was a week ago.  Patient will start Terbinafine in next two days and states she was suppose to follow up in 6 weeks after starting Terbinafine to check liver function. Appointment was scheduled to be on 02/19/16 @ 9:30 am and it will only be 4 weeks after. So appointment was moved to 03/04/16 @ 9:30 that will make it 6 weeks. Patient aware she has to come in fasting for blood work. All instructions given about Lysol spray. ______

## 2016-01-22 ENCOUNTER — Telehealth: Payer: Self-pay | Admitting: Internal Medicine

## 2016-01-22 DIAGNOSIS — J0100 Acute maxillary sinusitis, unspecified: Secondary | ICD-10-CM

## 2016-01-22 MED ORDER — AZITHROMYCIN 250 MG PO TABS: ORAL_TABLET | ORAL | Status: DC

## 2016-01-22 NOTE — Telephone Encounter (Signed)
Patient called has completed taking amoxicillin(AMOXIL) 500 mg.  Was feeling better, but not congestion has returned.  Patient wants to know if Dr. Amil Amen can call her in another Rx, even a different Rx for congestion.  Patient can be reached at [phone].    Patient would like Rx called into [facility] in [place] on [address] - [phone].

## 2016-01-30 ENCOUNTER — Telehealth: Payer: Self-pay | Admitting: Internal Medicine

## 2016-01-30 NOTE — Telephone Encounter (Signed)
Patient has seen Dr. Lynann Bologna, for ortho. Concerns.  Dr. Has ordered labs for patient.  Patient wants to know if she can get them done here.  Not sure of labs, but patient stated it looked like EFR, RF, and ANA.  Please advise patient.

## 2016-01-30 NOTE — Telephone Encounter (Signed)
Called patient to see if she could clarify what blood test she needs, patient very rude on the phone because she can't read the doctor's penmanship. I asked the patient if she could bring the prescription in for labs.She states she lives thirty minutes away and will not be able to do this. So I advised her to have the Ortho doctor to call our office or fax over a copy of the test needed.

## 2016-01-31 NOTE — Telephone Encounter (Signed)
Called and left message to see if patient was able to get clarification on her labs. Advise once again to have Ortho doctor contact our office by phone or fax

## 2016-01-31 NOTE — Telephone Encounter (Signed)
Spoke with Patient faxed over copy of test ESR, RF and ANA will check with Dr. Amil Amen and see if we can do labs in our office

## 2016-02-12 NOTE — Telephone Encounter (Signed)
Dr. Amil Amen spoke with patient she will go have labs done some place else.

## 2016-02-19 ENCOUNTER — Ambulatory Visit: Payer: Self-pay | Admitting: Internal Medicine

## 2016-03-04 ENCOUNTER — Ambulatory Visit (INDEPENDENT_AMBULATORY_CARE_PROVIDER_SITE_OTHER): Payer: Self-pay | Admitting: Internal Medicine

## 2016-03-04 ENCOUNTER — Encounter: Payer: Self-pay | Admitting: Internal Medicine

## 2016-03-04 VITALS — BP 124/72 | HR 80 | Ht 65.5 in | Wt 161.0 lb

## 2016-03-04 DIAGNOSIS — B351 Tinea unguium: Secondary | ICD-10-CM

## 2016-03-04 DIAGNOSIS — E785 Hyperlipidemia, unspecified: Secondary | ICD-10-CM

## 2016-03-04 DIAGNOSIS — Z79899 Other long term (current) drug therapy: Secondary | ICD-10-CM

## 2016-03-04 DIAGNOSIS — R739 Hyperglycemia, unspecified: Secondary | ICD-10-CM

## 2016-03-04 NOTE — Progress Notes (Signed)
   Subjective:    Patient ID: Para March, female    DOB: 03-22-1976, 40 y.o.   MRN: [id number]  HPI   1.  Onychomycosis:  No problems with Terbinafine.  Is about 6 weeks through her treatment.  Feels her nail is better, but uncertain. The red spots on back of her left thigh are unchanged.  Not pruritic. Have not spread.  She describes it feeling like a small abrasion at times.  Has not applied the Triamcinolone cream to it again.  2.  Elevated Glucose: Is fasting today.  3.  History of low HDL:  Is fasting today    Review of Systems     Objective:   Physical Exam  NAD Small pinpoint red lesion on back of left thigh.  Halo of hypopigmentation about the area.  Nonpalpable.  Right great toenail clear to distal aspect where still with some white discoloration and thickened crumbling.  No yellow discoloration any longer. Left great toenail with dryness at distal edge, but no thickening or crumbling.      Assessment & Plan:  1.  Toenail Onychomycosis:  Continue Terbinafine.  Check CMP  2.  Lesion on back of left thigh.  Likely due to Keratosis pilaris.  Treatment with Triamcinolone cream and hydrating lotion.  Not clear patient will do this.  3.  Hyperglycemia:  A1C and CMP today.  4.  Hx of low HDL:  Check FLP today.    3

## 2016-03-05 LAB — COMPREHENSIVE METABOLIC PANEL
ALT: 21 IU/L (ref 0–32)
AST: 20 IU/L (ref 0–40)
Albumin/Globulin Ratio: 2 (ref 1.2–2.2)
Albumin: 4.4 g/dL (ref 3.5–5.5)
Alkaline Phosphatase: 65 IU/L (ref 39–117)
BUN/Creatinine Ratio: 26 — ABNORMAL HIGH (ref 9–23)
BUN: 18 mg/dL (ref 6–24)
Bilirubin Total: 0.8 mg/dL (ref 0.0–1.2)
CO2: 24 mmol/L (ref 18–29)
Calcium: 9.3 mg/dL (ref 8.7–10.2)
Chloride: 103 mmol/L (ref 96–106)
Creatinine, Ser: 0.69 mg/dL (ref 0.57–1.00)
GFR calc Af Amer: 124 mL/min/{1.73_m2} (ref >59)
GFR calc non Af Amer: 108 mL/min/{1.73_m2} (ref >59)
Globulin, Total: 2.2 g/dL (ref 1.5–4.5)
Glucose: 99 mg/dL (ref 65–99)
Potassium: 4.5 mmol/L (ref 3.5–5.2)
Sodium: 143 mmol/L (ref 134–144)
Total Protein: 6.6 g/dL (ref 6.0–8.5)

## 2016-03-05 LAB — HGB A1C W/O EAG: Hgb A1c MFr Bld: 5.6 % (ref 4.8–5.6)

## 2016-03-05 LAB — LIPID PANEL W/O CHOL/HDL RATIO
Cholesterol, Total: 152 mg/dL (ref 100–199)
HDL: 37 mg/dL — ABNORMAL LOW (ref >39)
LDL Calculated: 99 mg/dL (ref 0–99)
Triglycerides: 81 mg/dL (ref 0–149)
VLDL Cholesterol Cal: 16 mg/dL (ref 5–40)

## 2016-04-17 ENCOUNTER — Ambulatory Visit: Payer: Self-pay | Admitting: Internal Medicine

## 2017-02-13 ENCOUNTER — Other Ambulatory Visit: Payer: Self-pay | Admitting: Gastroenterology

## 2017-02-20 ENCOUNTER — Other Ambulatory Visit: Payer: Self-pay | Admitting: Gastroenterology

## 2017-02-23 ENCOUNTER — Encounter (HOSPITAL_COMMUNITY): Payer: Self-pay | Admitting: *Deleted

## 2017-02-24 NOTE — Anesthesia Preprocedure Evaluation (Signed)
Anesthesia Evaluation  Patient identified by MRN, date of birth, ID band Patient awake    Reviewed: Allergy & Precautions, NPO status , Patient's Chart, lab work & pertinent test results  Airway Mallampati: II  TM Distance: >3 FB Neck ROM: Full    Dental no notable dental hx.    Pulmonary neg pulmonary ROS,    Pulmonary exam normal breath sounds clear to auscultation       Cardiovascular negative cardio ROS Normal cardiovascular exam Rhythm:Regular Rate:Normal     Neuro/Psych negative neurological ROS  negative psych ROS   GI/Hepatic negative GI ROS, Neg liver ROS,   Endo/Other  Hypothyroidism   Renal/GU negative Renal ROS     Musculoskeletal  (+) Arthritis , Fibromyalgia -  Abdominal   Peds  Hematology negative hematology ROS (+)   Anesthesia Other Findings   Reproductive/Obstetrics negative OB ROS                             Anesthesia Physical Anesthesia Plan  ASA: II  Anesthesia Plan: MAC   Post-op Pain Management:    Induction:   Airway Management Planned:   Additional Equipment:   Intra-op Plan:   Post-operative Plan:   Informed Consent: I have reviewed the patients History and Physical, chart, labs and discussed the procedure including the risks, benefits and alternatives for the proposed anesthesia with the patient or authorized representative who has indicated his/her understanding and acceptance.   Dental advisory given  Plan Discussed with: CRNA  Anesthesia Plan Comments:         Anesthesia Quick Evaluation

## 2017-02-25 ENCOUNTER — Ambulatory Visit (HOSPITAL_COMMUNITY): Payer: Self-pay | Admitting: Anesthesiology

## 2017-02-25 ENCOUNTER — Ambulatory Visit (HOSPITAL_COMMUNITY)
Admission: RE | Admit: 2017-02-25 | Discharge: 2017-02-25 | Disposition: A | Discharge status: Home / Self Care | Payer: Self-pay | Source: Ambulatory Visit | Attending: Gastroenterology | Admitting: Gastroenterology

## 2017-02-25 ENCOUNTER — Encounter (HOSPITAL_COMMUNITY)
Admission: RE | Disposition: A | Discharge status: Home / Self Care | Payer: Self-pay | Source: Ambulatory Visit | Attending: Gastroenterology

## 2017-02-25 ENCOUNTER — Encounter (HOSPITAL_COMMUNITY): Payer: Self-pay | Admitting: Certified Registered Nurse Anesthetist

## 2017-02-25 DIAGNOSIS — K921 Melena: Secondary | ICD-10-CM | POA: Insufficient documentation

## 2017-02-25 DIAGNOSIS — E039 Hypothyroidism, unspecified: Secondary | ICD-10-CM | POA: Insufficient documentation

## 2017-02-25 DIAGNOSIS — K648 Other hemorrhoids: Secondary | ICD-10-CM | POA: Insufficient documentation

## 2017-02-25 DIAGNOSIS — M797 Fibromyalgia: Secondary | ICD-10-CM | POA: Insufficient documentation

## 2017-02-25 DIAGNOSIS — M1990 Unspecified osteoarthritis, unspecified site: Secondary | ICD-10-CM | POA: Insufficient documentation

## 2017-02-25 DIAGNOSIS — K635 Polyp of colon: Secondary | ICD-10-CM | POA: Insufficient documentation

## 2017-02-25 HISTORY — PX: COLONOSCOPY WITH PROPOFOL: SHX5780

## 2017-02-25 SURGERY — COLONOSCOPY WITH PROPOFOL
Anesthesia: Monitor Anesthesia Care

## 2017-02-25 MED ORDER — PROPOFOL 10 MG/ML IV BOLUS
INTRAVENOUS | Status: AC
  Filled 2017-02-25: qty 40

## 2017-02-25 MED ORDER — LACTATED RINGERS IV SOLN
INTRAVENOUS | Status: DC | PRN
  Administered 2017-02-25: 08:00:00 via INTRAVENOUS

## 2017-02-25 MED ORDER — SODIUM CHLORIDE 0.9 % IV SOLN: INTRAVENOUS | Status: DC

## 2017-02-25 MED ORDER — PROPOFOL 10 MG/ML IV BOLUS
INTRAVENOUS | Status: DC | PRN
Start: 2017-02-25 — End: 2017-02-25
  Administered 2017-02-25 (×2): 20 mg via INTRAVENOUS
  Administered 2017-02-25: 40 mg via INTRAVENOUS
  Administered 2017-02-25 (×5): 20 mg via INTRAVENOUS
  Administered 2017-02-25: 40 mg via INTRAVENOUS
  Administered 2017-02-25: 20 mg via INTRAVENOUS

## 2017-02-25 SURGICAL SUPPLY — 21 items

## 2017-02-25 NOTE — H&P (Signed)
Patient interval history reviewed.  Patient examined again.  There has been no change from documented H/P dated 02/10/2017 (scanned into chart from our office) except as documented above.  Assessment:  1.  Hematochezia.  Plan:  1.  Colonoscopy. 2.  Risks (bleeding, infection, bowel perforation that could require surgery, sedation-related changes in cardiopulmonary systems), benefits (identification and possible treatment of source of symptoms, exclusion of certain causes of symptoms), and alternatives (watchful waiting, radiographic imaging studies, empiric medical treatment) of colonoscopy were explained to patient/family in detail and patient wishes to proceed.

## 2017-02-25 NOTE — Discharge Instructions (Signed)
YOU HAD AN ENDOSCOPIC PROCEDURE TODAY: Refer to the procedure report and other information in the discharge instructions given to you for any specific questions about what was found during the examination. If this information does not answer your questions, please call [facility] office at [phone] to clarify.  ° °YOU SHOULD EXPECT: Some feelings of bloating in the abdomen. Passage of more gas than usual. Walking can help get rid of the air that was put into your GI tract during the procedure and reduce the bloating. If you had a lower endoscopy (such as a colonoscopy or flexible sigmoidoscopy) you may notice spotting of blood in your stool or on the toilet paper. Some abdominal soreness may be present for a day or two, also. ° °DIET: Your first meal following the procedure should be a light meal and then it is ok to progress to your normal diet. A half-sandwich or bowl of soup is an example of a good first meal. Heavy or fried foods are harder to digest and may make you feel nauseous or bloated. Drink plenty of fluids but you should avoid alcoholic beverages for 24 hours. If you had a esophageal dilation, please see attached instructions for diet.  ° °ACTIVITY: Your care partner should take you home directly after the procedure. You should plan to take it easy, moving slowly for the rest of the day. You can resume normal activity the day after the procedure however YOU SHOULD NOT DRIVE, use power tools, machinery or perform tasks that involve climbing or major physical exertion for 24 hours (because of the sedation medicines used during the test).  ° °SYMPTOMS TO REPORT IMMEDIATELY: °A gastroenterologist can be reached at any hour. Please call [phone]  for any of the following symptoms:  °Following lower endoscopy (colonoscopy, flexible sigmoidoscopy) °Excessive amounts of blood in the stool  °Significant tenderness, worsening of abdominal pains  °Swelling of the abdomen that is new, acute  °Fever of 100° or  higher  °Following upper endoscopy (EGD, EUS, ERCP, esophageal dilation) °Vomiting of blood or coffee ground material  °New, significant abdominal pain  °New, significant chest pain or pain under the shoulder blades  °Painful or persistently difficult swallowing  °New shortness of breath  °Black, tarry-looking or red, bloody stools ° °FOLLOW UP:  °If any biopsies were taken you will be contacted by phone or by letter within the next 1-3 weeks. Call [phone]  if you have not heard about the biopsies in 3 weeks.  °Please also call with any specific questions about appointments or follow up tests. °YOU HAD AN ENDOSCOPIC PROCEDURE TODAY: Refer to the procedure report and other information in the discharge instructions given to you for any specific questions about what was found during the examination. If this information does not answer your questions, please call [facility] office at [phone] to clarify.  ° °YOU SHOULD EXPECT: Some feelings of bloating in the abdomen. Passage of more gas than usual. Walking can help get rid of the air that was put into your GI tract during the procedure and reduce the bloating. If you had a lower endoscopy (such as a colonoscopy or flexible sigmoidoscopy) you may notice spotting of blood in your stool or on the toilet paper. Some abdominal soreness may be present for a day or two, also. ° °DIET: Your first meal following the procedure should be a light meal and then it is ok to progress to your normal diet. A half-sandwich or bowl of soup is an example   of a good first meal. Heavy or fried foods are harder to digest and may make you feel nauseous or bloated. Drink plenty of fluids but you should avoid alcoholic beverages for 24 hours. If you had a esophageal dilation, please see attached instructions for diet.  ° °ACTIVITY: Your care partner should take you home directly after the procedure. You should plan to take it easy, moving slowly for the rest of the day. You can resume  normal activity the day after the procedure however YOU SHOULD NOT DRIVE, use power tools, machinery or perform tasks that involve climbing or major physical exertion for 24 hours (because of the sedation medicines used during the test).  ° °SYMPTOMS TO REPORT IMMEDIATELY: °A gastroenterologist can be reached at any hour. Please call [phone]  for any of the following symptoms:  °Following lower endoscopy (colonoscopy, flexible sigmoidoscopy) °Excessive amounts of blood in the stool  °Significant tenderness, worsening of abdominal pains  °Swelling of the abdomen that is new, acute  °Fever of 100° or higher  °Following upper endoscopy (EGD, EUS, ERCP, esophageal dilation) °Vomiting of blood or coffee ground material  °New, significant abdominal pain  °New, significant chest pain or pain under the shoulder blades  °Painful or persistently difficult swallowing  °New shortness of breath  °Black, tarry-looking or red, bloody stools ° °FOLLOW UP:  °If any biopsies were taken you will be contacted by phone or by letter within the next 1-3 weeks. Call [phone]  if you have not heard about the biopsies in 3 weeks.  °Please also call with any specific questions about appointments or follow up tests. ° °

## 2017-02-25 NOTE — Anesthesia Postprocedure Evaluation (Signed)
Anesthesia Post Note  Patient: Frances [name]  Procedure(s) Performed: Procedure(s) (LRB): COLONOSCOPY WITH PROPOFOL (N/A)  Patient location during evaluation: PACU Anesthesia Type: MAC Level of consciousness: awake and alert Pain management: pain level controlled Vital Signs Assessment: post-procedure vital signs reviewed and stable Respiratory status: spontaneous breathing Cardiovascular status: stable Anesthetic complications: no       Last Vitals:  Vitals:   02/25/17 0940 02/25/17 1000  BP: 115/84   Pulse:  70  Resp:    Temp:      Last Pain:  Vitals:   02/25/17 0934  TempSrc: Oral  PainSc:                  Nolon Nations

## 2017-02-25 NOTE — Op Note (Signed)
[facility] Patient Name: Frances [name] Procedure Date: 02/25/2017 MRN: [id number] Attending MD: Arta Silence , MD Date of Birth: Mar 17, 1976 CSN: [id number] Age: 41 Admit Type: Outpatient Procedure:                Colonoscopy Indications:              This is the patient's first colonoscopy,                            Hematochezia Providers:                Arta Silence, MD, Laverta Baltimore RN, RN, Cherylynn Ridges, Technician, Dion Saucier, CRNA Referring MD:             Marliss Coots, FNP Medicines:                Monitored Anesthesia Care Complications:            No immediate complications. Estimated Blood Loss:     Estimated blood loss: none. Procedure:                Pre-Anesthesia Assessment:                           - Prior to the procedure, a History and Physical                            was performed, and patient medications and                            allergies were reviewed. The patient's tolerance of                            previous anesthesia was also reviewed. The risks                            and benefits of the procedure and the sedation                            options and risks were discussed with the patient.                            All questions were answered, and informed consent                            was obtained. Prior Anticoagulants: The patient has                            taken previous NSAID medication. ASA Grade                            Assessment: II - A patient with mild systemic  disease. After reviewing the risks and benefits,                            the patient was deemed in satisfactory condition to                            undergo the procedure.                           After obtaining informed consent, the colonoscope                            was passed under direct vision. Throughout the                            procedure, the patient's  blood pressure, pulse, and                            oxygen saturations were monitored continuously. The                            EC-[id number] ([id number]) scope was introduced through                            the anus and advanced to the the terminal ileum,                            with identification of the appendiceal orifice and                            IC valve. The terminal ileum, ileocecal valve,                            appendiceal orifice, and rectum were photographed.                            The entire colon was examined. The colonoscopy was                            performed without difficulty. The patient tolerated                            the procedure well. The quality of the bowel                            preparation was good. Findings:      The perianal and digital rectal examinations were normal.      Internal hemorrhoids were found during retroflexion. The hemorrhoids       were mild.      No additional abnormalities were found on retroflexion.      A 6 mm polyp was found in the transverse colon. The polyp was sessile.       The polyp was removed with a hot snare. Resection and retrieval were       complete.  Colon otherwise normal; no other polyps, masses, vascular ectasias, or       inflammatory changes were seen.      The distal 5cm of the terminal ileum appeared normal. Impression:               - Internal hemorrhoids.                           - One 6 mm polyp in the transverse colon, removed                            with a hot snare. Resected and retrieved.                           - The examined portion of the ileum was normal.                           - The examination was otherwise normal. Suspect                            intermittent bleeding is from hemorrhoids. Moderate Sedation:      None Recommendation:           - Patient has a contact number available for                            emergencies. The signs and symptoms of  potential                            delayed complications were discussed with the                            patient. Return to normal activities tomorrow.                            Written discharge instructions were provided to the                            patient.                           - Discharge patient to home (ambulatory).                           - High fiber diet indefinitely.                           - Topical therapies (e.g., Preparation-H),                            avoidance of constipation/straining, liberal water                            intake, for management of hemorrhoids.                           - Continue present medications.                           -  Await pathology results.                           - Repeat colonoscopy in 5-10 years for surveillance                            based on pathology results.                           - Return to GI clinic PRN.                           - Return to referring physician as previously                            scheduled. Procedure Code(s):        --- Professional ---                           [phone], Colonoscopy, flexible; with removal of                            tumor(s), polyp(s), or other lesion(s) by snare                            technique Diagnosis Code(s):        --- Professional ---                           D12.3, Benign neoplasm of transverse colon (hepatic                            flexure or splenic flexure)                           K64.8, Other hemorrhoids                           K92.1, Melena (includes Hematochezia) CPT copyright 2016 American Medical Association. All rights reserved. The codes documented in this report are preliminary and upon coder review may  be revised to meet current compliance requirements. Arta Silence, MD 02/25/2017 9:33:12 AM This report has been signed electronically. Number of Addenda: 0

## 2017-02-25 NOTE — Transfer of Care (Signed)
Immediate Anesthesia Transfer of Care Note  Patient: Frances [name]  Procedure(s) Performed: Procedure(s): COLONOSCOPY WITH PROPOFOL (N/A)  Patient Location: PACU and Endoscopy Unit  Anesthesia Type:MAC  Level of Consciousness: awake, oriented, patient cooperative, lethargic and responds to stimulation  Airway & Oxygen Therapy: Patient Spontanous Breathing and Patient connected to face mask oxygen  Post-op Assessment: Report given to RN, Post -op Vital signs reviewed and stable and Patient moving all extremities  Post vital signs: Reviewed and stable  Last Vitals:  Vitals:   02/25/17 0845  BP: (!) 120/92  Pulse: 87  Resp: 11  Temp: 37.2 C    Last Pain:  Vitals:   02/25/17 0901  TempSrc:   PainSc: 3          Complications: No apparent anesthesia complications

## 2017-02-26 ENCOUNTER — Encounter (HOSPITAL_COMMUNITY): Payer: Self-pay | Admitting: Gastroenterology

## 2017-05-08 ENCOUNTER — Telehealth: Payer: Self-pay | Admitting: Rheumatology

## 2017-05-08 NOTE — Telephone Encounter (Signed)
Patient called to check the status of the referral that was sent to Dr. Estanislado Pandy from Dr. Lynann Bologna. The referral had been declined, per Dr. Estanislado Pandy and the referring office was notified last week. When I informed the patient that her referral had been declined, she became very angered and belligerent about the entire situation. She was very hateful and I tried to explain the referral process for Dr. Estanislado Pandy and the patient would not let me, due to her raising her voice and being so angry. She stated she had seen Dr. Estanislado Pandy in the past (several years ago) and could not understand why the referral was declined. She then said "I don't even like Dr. Estanislado Pandy but she is the only choice I have due to having free charity care through [facility]." I apologized to the patient about the referral being declined and she would barely let me speak, then she hung up the phone.   Ivin Booty Curator) is aware of the situation that took place, I notified her after the phone conversation with the patient.

## 2017-07-03 NOTE — Anesthesia Postprocedure Evaluation (Signed)
Anesthesia Post Note  Patient: Frances [name]  Procedure(s) Performed: Procedure(s) (LRB): COLONOSCOPY WITH PROPOFOL (N/A)     Anesthesia Post Evaluation  Last Vitals:  Vitals:   02/25/17 0940 02/25/17 1000  BP: 115/84   Pulse:  70  Resp:    Temp:      Last Pain:  Vitals:   02/25/17 0934  TempSrc: Oral  PainSc:                  Nolon Nations

## 2017-07-03 NOTE — Addendum Note (Signed)
Addendum  created 07/03/17 0931 by Nolon Nations, MD   Sign clinical note

## 2017-07-29 ENCOUNTER — Other Ambulatory Visit (HOSPITAL_BASED_OUTPATIENT_CLINIC_OR_DEPARTMENT_OTHER): Payer: Self-pay | Admitting: *Deleted

## 2017-07-29 DIAGNOSIS — M542 Cervicalgia: Secondary | ICD-10-CM

## 2017-07-29 DIAGNOSIS — M549 Dorsalgia, unspecified: Secondary | ICD-10-CM

## 2017-08-01 ENCOUNTER — Ambulatory Visit (HOSPITAL_BASED_OUTPATIENT_CLINIC_OR_DEPARTMENT_OTHER)
Admission: RE | Admit: 2017-08-01 | Discharge: 2017-08-01 | Disposition: A | Discharge status: Home / Self Care | Payer: No Typology Code available for payment source | Source: Ambulatory Visit | Attending: *Deleted | Admitting: *Deleted

## 2017-08-01 DIAGNOSIS — M50223 Other cervical disc displacement at C6-C7 level: Secondary | ICD-10-CM | POA: Insufficient documentation

## 2017-08-01 DIAGNOSIS — M549 Dorsalgia, unspecified: Secondary | ICD-10-CM

## 2017-08-01 DIAGNOSIS — M5134 Other intervertebral disc degeneration, thoracic region: Secondary | ICD-10-CM | POA: Insufficient documentation

## 2017-08-01 DIAGNOSIS — M542 Cervicalgia: Secondary | ICD-10-CM

## 2017-08-31 ENCOUNTER — Other Ambulatory Visit (HOSPITAL_BASED_OUTPATIENT_CLINIC_OR_DEPARTMENT_OTHER): Payer: Self-pay | Admitting: Orthopedic Surgery

## 2017-08-31 DIAGNOSIS — M25532 Pain in left wrist: Secondary | ICD-10-CM

## 2017-08-31 DIAGNOSIS — M25551 Pain in right hip: Secondary | ICD-10-CM

## 2017-09-05 ENCOUNTER — Ambulatory Visit (HOSPITAL_BASED_OUTPATIENT_CLINIC_OR_DEPARTMENT_OTHER)
Admission: RE | Admit: 2017-09-05 | Discharge: 2017-09-05 | Disposition: A | Discharge status: Home / Self Care | Payer: No Typology Code available for payment source | Source: Ambulatory Visit | Attending: Orthopedic Surgery | Admitting: Orthopedic Surgery

## 2017-09-05 ENCOUNTER — Ambulatory Visit (HOSPITAL_BASED_OUTPATIENT_CLINIC_OR_DEPARTMENT_OTHER)
Admission: RE | Admit: 2017-09-05 | Discharge: 2017-09-05 | Disposition: A | Discharge status: Home / Self Care | Payer: Self-pay | Source: Ambulatory Visit | Attending: Orthopedic Surgery | Admitting: Orthopedic Surgery

## 2017-09-05 DIAGNOSIS — M25532 Pain in left wrist: Secondary | ICD-10-CM

## 2017-09-05 DIAGNOSIS — M25551 Pain in right hip: Secondary | ICD-10-CM

## 2017-09-05 DIAGNOSIS — M48061 Spinal stenosis, lumbar region without neurogenic claudication: Secondary | ICD-10-CM | POA: Insufficient documentation

## 2017-09-05 DIAGNOSIS — Z975 Presence of (intrauterine) contraceptive device: Secondary | ICD-10-CM | POA: Insufficient documentation

## 2017-09-10 ENCOUNTER — Encounter: Payer: Self-pay | Admitting: *Deleted

## 2017-09-10 ENCOUNTER — Emergency Department (INDEPENDENT_AMBULATORY_CARE_PROVIDER_SITE_OTHER)
Admission: EM | Admit: 2017-09-10 | Discharge: 2017-09-10 | Disposition: A | Discharge status: Home / Self Care | Payer: No Typology Code available for payment source | Source: Home / Self Care | Attending: Family Medicine | Admitting: Family Medicine

## 2017-09-10 ENCOUNTER — Other Ambulatory Visit (HOSPITAL_BASED_OUTPATIENT_CLINIC_OR_DEPARTMENT_OTHER): Payer: Self-pay | Admitting: Orthopedic Surgery

## 2017-09-10 DIAGNOSIS — M79675 Pain in left toe(s): Secondary | ICD-10-CM

## 2017-09-10 DIAGNOSIS — M25552 Pain in left hip: Secondary | ICD-10-CM

## 2017-09-10 DIAGNOSIS — S0990XA Unspecified injury of head, initial encounter: Secondary | ICD-10-CM

## 2017-09-10 HISTORY — DX: Genetic susceptibility to other disease: Z15.89

## 2017-09-10 HISTORY — DX: Other intervertebral disc degeneration, lumbar region: M51.36

## 2017-09-10 HISTORY — DX: Bipolar disorder, unspecified: F31.9

## 2017-09-10 HISTORY — DX: Other intervertebral disc degeneration, lumbar region without mention of lumbar back pain or lower extremity pain: M51.369

## 2017-09-10 HISTORY — DX: Anxiety disorder, unspecified: F41.9

## 2017-09-10 HISTORY — DX: Methylenetetrahydrofolate reductase deficiency: E72.12
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
## 2017-09-10 NOTE — ED Notes (Signed)
Spent > 1 hour working on behavioral health and primary care establishments. Discussed providers at length. She decided on trying [facility] here in [place] in October. She agreed to see Iran Planas, PA for PCP appointment but there is a request in for Dr. Madilyn Fireman who previously saw her in 2013 to take her on a as a patient as this is her preference. Billing is an issue for her as she "has no money and can't pay anyone anything ever". She has the [facility] benefit that runs out mid October and she is leery that she will nto get reinstated.  Upon discharge she is communicable and denies a want to hurt or kill herself. She is agreeable to the follow up appointments.

## 2017-09-10 NOTE — Discharge Instructions (Signed)
Apply ice pack to forehead 2 to 3 times daily to decrease swelling.

## 2017-09-10 NOTE — ED Provider Notes (Signed)
[name]    CSN: [id number] Arrival date & time: 09/10/17  7062     History   Chief Complaint Chief Complaint  Patient presents with  . Head Injury  . Dizziness  . Toe Pain    HPI Frances [name] is a 41 y.o. female.   Patient complains of long history of psychiatric issues including anxiety, depression, and possible bipolar disease that do not appear to have been effectively treated in the past.  She also complains of chronic insomnia.  She reports that she self-mutilates when she becomes angry or anxious, often hitting her head or an extremity on a wall.  She is limited by lack of medical insurance and being unemployed.  She reports that she has suicidal thoughts at times but would never act upon them.  She states that she became angry two days ago and hit her forehead on her truck.  She denies loss of consciousness at that time, but complains of persistent soreness of her mid-forehead and difficulty concentrating.   She has been prescribed numerous anti-depresssant and anti-anxiety medications in the past, none of which have been effective.  She has had numerous medications for insomnia, some of which were effective only initially.  She states that she has a MTHFR gene mutation. She complains of a sore left great toe.  She recalls that earlier this year she noticed fluid underneath the toenail, which eventually became loose and fell off.  She reports that recently she has had mild tenderness at the medial edge of her toenail without swelling, redness, or drainage.   The history is provided by the patient.  Head Injury  Location:  Frontal Time since incident:  2 days Mechanism of injury: self-inflicted   Pain details:    Quality:  Aching   Severity:  Mild   Duration:  2 days   Timing:  Constant   Progression:  Improving Chronicity:  Recurrent Relieved by:  None tried Worsened by:  Pressure Ineffective treatments:  None tried Associated symptoms: headache    Associated symptoms: no blurred vision, no difficulty breathing, no disorientation, no double vision, no focal weakness, no hearing loss, no loss of consciousness, no memory loss, no nausea, no neck pain, no numbness, no seizures, no tinnitus and no vomiting   Dizziness  Associated symptoms: headaches   Associated symptoms: no hearing loss, no nausea, no tinnitus, no vomiting and no weakness   Toe Pain  Associated symptoms include headaches.    Past Medical History:  Diagnosis Date  . Anxiety   . Arthritis   . Back pain, chronic   . Bipolar 1 disorder ([place])   . Chronic fatigue syndrome   . DDD (degenerative disc disease), lumbar   . Depression   . Fibromyalgia   . IUD 2012   Mirena  . MTHFR gene mutation [facility])     Patient Active Problem List   Diagnosis Date Noted  . DYSPNEA 10/15/2011  . FEVER, RECURRENT 04/02/2009  . DERMATITIS, ALLERGIC 06/05/2008  . FATIGUE 05/05/2008  . HYPOTHYROIDISM 02/10/2008  . INSOMNIA-SLEEP DISORDER-UNSPEC 02/10/2008  . 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 FIBROMYALGIA 02/10/2008  . IRON, SERUM, ELEVATED 02/10/2008    Past Surgical History:  Procedure Laterality Date  . COLONOSCOPY WITH PROPOFOL N/A 02/25/2017   Procedure: COLONOSCOPY WITH PROPOFOL;  Surgeon: Arta Silence, MD;  Location: [facility];  Service: Endoscopy;  Laterality: N/A;  . left knee lateral release  1993  . scar tisue removal  03/2005  left ankle  . TONSILLECTOMY  age 51's    OB History    Gravida Para Term Preterm AB Living   0 0 0 0 0 0   SAB TAB Ectopic Multiple Live Births   0 0 0 0         Home Medications    Prior to Admission medications   Medication Sig Start Date End Date Taking? Authorizing Provider  Carboxymethylcellul-Glycerin (LUBRICATING EYE DROPS OP) Apply 1 drop to eye 2 (two) times daily as needed (dry eyes).   Yes [provider]  cetirizine (ZYRTEC) 10 MG tablet Take 10 mg by mouth daily.   Yes [provider]  Glucosamine HCl (GLUCOSAMINE PO) Take 1 tablet by mouth 2 (two) times daily.   Yes [provider]  HYDROcodone-acetaminophen (NORCO) 5-325 MG per tablet Take 1 tablet by mouth every 6 (six) hours as needed for moderate pain.    Yes [provider]  levonorgestrel (MIRENA, 52 MG,) 20 MCG/24HR IUD 1 each by Intrauterine route once.   Yes [provider]  methocarbamol (ROBAXIN) 500 MG tablet Take 750-1,000 mg by mouth 3 (three) times daily as needed for muscle spasms.   Yes [provider]  montelukast (SINGULAIR) 10 MG tablet Take 10 mg by mouth at bedtime.   Yes [provider]  naproxen (NAPROSYN) 500 MG tablet Take 500 mg by mouth 2 (two) times daily with a meal.   Yes [provider]  Olive Leaf Extract 500 MG CAPS Take 500 mg by mouth daily.   Yes [provider]  Oxycodone HCl 10 MG TABS Take 5 mg by mouth 2 (two) times daily as needed (pain).   Yes [provider]  Polyethylene Glycol 3350 (MIRALAX PO) Take by mouth.   Yes [provider]  tiZANidine (ZANAFLEX) 4 MG tablet Take 4 mg by mouth every 6 (six) hours as needed for muscle spasms.   Yes [provider]  traMADol (ULTRAM) 50 MG tablet Take 25-100 mg by mouth 2 (two) times daily as needed for moderate pain.   Yes [provider]  Magnesium Citrate 100 MG TABS Take 100 mg by mouth. 1 to 3 times a day    [provider]  meloxicam (MOBIC) 15 MG tablet Take 15 mg by mouth daily.    [provider]  Misc Natural Products (TURMERIC CURCUMIN) CAPS Take 2 capsules by mouth daily. With ginger    [provider]    Family History Family History  Problem Relation Age of Onset  . Diabetes Mother   . Stroke Mother   . Lupus Mother   . Hypertension Mother   . Congestive Heart Failure Mother   . Mental illness Brother        Not clear what his diagnosis is--may be related to previous drug use.  .  Cancer Maternal Grandmother        colon  . Depression Maternal Uncle   . Alcohol abuse Maternal Uncle   . Diabetes Maternal Grandfather     Social History Social History  Substance Use Topics  . Smoking status: Never Smoker  . Smokeless tobacco: Never Used  . Alcohol use No     Allergies   Metaxalone   Review of Systems Review of Systems  Constitutional: Positive for fatigue. Negative for activity change, appetite change, chills, diaphoresis and fever.  HENT: Negative for hearing loss and tinnitus.   Eyes: Positive for visual disturbance. Negative for blurred vision,  double vision and pain.  Respiratory: Negative.   Cardiovascular: Negative.   Gastrointestinal: Negative for nausea and vomiting.  Genitourinary: Negative.   Musculoskeletal: Negative for neck pain.  Skin: Negative.   Neurological: Positive for dizziness, light-headedness and headaches. Negative for tremors, focal weakness, seizures, loss of consciousness, syncope, facial asymmetry, speech difficulty, weakness and numbness.  Psychiatric/Behavioral: Positive for agitation, decreased concentration, dysphoric mood, self-injury, sleep disturbance and suicidal ideas. Negative for confusion, hallucinations and memory loss. The patient is nervous/anxious. The patient is not hyperactive.      Physical Exam Triage Vital Signs ED Triage Vitals  Enc Vitals Group     BP 09/10/17 0956 127/84     Pulse Rate 09/10/17 0956 86     Resp 09/10/17 0956 14     Temp 09/10/17 0956 97.9 F (36.6 C)     Temp Source 09/10/17 0956 Oral     SpO2 09/10/17 0956 99 %     Weight 09/10/17 0957 163 lb (73.9 kg)     Height --      Head Circumference --      Peak Flow --      Pain Score 09/10/17 0957 2     Pain Loc --      Pain Edu? --      Excl. in [place]? --    No data found.   Updated Vital Signs BP 127/84 (BP Location: Left Arm)   Pulse 86   Temp 97.9 F (36.6 C) (Oral)   Resp 14   Wt 163 lb (73.9 kg)   SpO2 99%   BMI  25.15 kg/m   Visual Acuity Right Eye Distance:   Left Eye Distance:   Bilateral Distance:    Right Eye Near:   Left Eye Near:    Bilateral Near:     Physical Exam Nursing notes and Vital Signs reviewed. Appearance:  Patient appears stated age, and in no acute distress.   Head:  Mild tenderness mid forehead without ecchymosis, hematoma, or bony step-off. Psychiatric:  Patient is alert and oriented with good eye contact.  Thoughts are organized.  No psychomotor retardation.  Memory intact.  Mildly depressed mood and flat affect.  Quite talkative.  Not suicidal  Eyes:  Pupils are equal, round, and reactive to light and accomodation.  Extraocular movement is intact.  Conjunctivae are not inflamed.  Fundi benign. Ears:  Canals normal.  Tympanic membranes normal.  Nose:  Normal turbinates.  No sinus tenderness.   Pharynx:  Normal Neck:  Supple.  No adenopathy or thyromegaly. Lungs:  Clear to auscultation.  Breath sounds are equal.  Moving air well. Heart:  Regular rate and rhythm without murmurs, rubs, or gallops.  Abdomen:  Nontender  Extremities:  No edema.  Skin:  No rash present.   Neurologic:  Cranial nerves 2 through 12 are normal.  Patellar, achilles, and elbow reflexes are normal.  Cerebellar function is intact (finger-to-nose and rapid alternating hand movement).  Gait and station are normal.     UC Treatments / Results  Labs (all labs ordered are listed, but only abnormal results are displayed) Labs Reviewed - No data to display  EKG  EKG Interpretation None       Radiology No results found.  Procedures Procedures (including critical care time)  Medications Ordered in UC Medications - No data to display   Initial Impression / Assessment and Plan / UC Course  I have reviewed the triage vital signs and the nursing notes.  Pertinent  labs & imaging results that were available during my care of the patient were reviewed by me and considered in my medical decision  making (see chart for details).    Recommend follow-up with behavior health provider, psychiatrist, or PCP to address multiple psychiatric concerns.  She states that she has been treated in the past for both anxiety and depression, and has a continuing problem with insomnia.  She states that she has a MTHFR gene mutation and has responded poorly in the past to multiple medications.  She often self-mutilates when she becomes anxious or angry.  She appears mildly depressed today but is not suicidal. Possible mild concussion; normal neurologic exam reassuring.  Discussed head injury precautions. Apply ice pack to forehead 2 to 3 times daily to decrease swelling. Continue until pain and swelling decrease.  No evidence ingrown left great toenail (patient has new toenail that is growing slowly into place after previous nail trauma earlier this year).  Discussed techniques for preventing ingrown nail.    Final Clinical Impressions(s) / UC Diagnoses   Final diagnoses:  Injury of head, initial encounter  Great toe pain, left    New Prescriptions New Prescriptions   No medications on file        Kandra Nicolas, MD 09/10/17 1206

## 2017-09-11 ENCOUNTER — Telehealth: Payer: Self-pay | Admitting: *Deleted

## 2017-09-11 NOTE — Telephone Encounter (Signed)
Patient reports she is feeling more upbeat today and was very down yesterday. She plans to keep her set up appointments and thanked [place] for our help.

## 2017-09-14 ENCOUNTER — Ambulatory Visit (HOSPITAL_BASED_OUTPATIENT_CLINIC_OR_DEPARTMENT_OTHER)
Admission: RE | Admit: 2017-09-14 | Discharge: 2017-09-14 | Disposition: A | Discharge status: Home / Self Care | Payer: No Typology Code available for payment source | Source: Ambulatory Visit | Attending: Orthopedic Surgery | Admitting: Orthopedic Surgery

## 2017-09-14 DIAGNOSIS — M25552 Pain in left hip: Secondary | ICD-10-CM | POA: Insufficient documentation

## 2017-09-16 ENCOUNTER — Ambulatory Visit (INDEPENDENT_AMBULATORY_CARE_PROVIDER_SITE_OTHER): Payer: No Typology Code available for payment source | Admitting: Physician Assistant

## 2017-09-16 ENCOUNTER — Encounter: Payer: Self-pay | Admitting: Physician Assistant

## 2017-09-16 VITALS — BP 136/86 | HR 96 | Ht 67.0 in | Wt 163.0 lb

## 2017-09-16 DIAGNOSIS — M797 Fibromyalgia: Secondary | ICD-10-CM | POA: Insufficient documentation

## 2017-09-16 DIAGNOSIS — R5382 Chronic fatigue, unspecified: Secondary | ICD-10-CM

## 2017-09-16 DIAGNOSIS — Z1589 Genetic susceptibility to other disease: Secondary | ICD-10-CM

## 2017-09-16 DIAGNOSIS — F319 Bipolar disorder, unspecified: Secondary | ICD-10-CM

## 2017-09-16 DIAGNOSIS — E7212 Methylenetetrahydrofolate reductase deficiency: Secondary | ICD-10-CM

## 2017-09-16 DIAGNOSIS — G9332 Myalgic encephalomyelitis/chronic fatigue syndrome: Secondary | ICD-10-CM

## 2017-09-16 NOTE — Progress Notes (Signed)
   Subjective:    Patient ID: Frances [name], female    DOB: 13-Sep-1976, 41 y.o.   MRN: [id number]  HPI  Pt is a 41 yo female with a long pyschiatric hx and medication failures that comes to establish care after her last PCP discharged her from office.   She sees Dr. Lynann Bologna at [place] for low back pain and possible RA. She gives her pain medication. She has tried numerous other medications such as elavil, gabaptentin that just seemed to not help and have side effects. She did see Dr. Kathi Ludwig once but did not like her. She has appt with Dr. Tamala Julian in October. She is interested in low dose naltrexone therapy for pain but cannot get anyone who knows anything about it.   Her bipolar has not been able to be controlled with medications either. Buspar, prozac, seroquel just seemed to make her "want to blow". She has appt with Dr. Primitivo Gauze in October as well.   .. Active Ambulatory Problems    Diagnosis Date Noted  . FEVER, RECURRENT 04/02/2009  . HYPOTHYROIDISM 02/10/2008  . INSOMNIA-SLEEP DISORDER-UNSPEC 02/10/2008  . DERMATITIS, ALLERGIC 06/05/2008  . 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 FIBROMYALGIA 02/10/2008  . FATIGUE 05/05/2008  . IRON, SERUM, ELEVATED 02/10/2008  . DYSPNEA 10/15/2011  . Bipolar I disorder ([place]) 09/16/2017  . Fibromyalgia 09/16/2017  . Chronic fatigue syndrome 09/16/2017  . MTHFR gene mutation ([place]) 09/20/2017   Resolved Ambulatory Problems    Diagnosis Date Noted  . SINUSITIS- ACUTE-NOS 03/24/2008  . URI 01/17/2009  . ANKLE PAIN, BILATERAL 07/28/2008  . BACK PAIN 10/13/2008  . OPEN WOUND FT NO TOE ALONE WITHOUT MENTION COMP 03/28/2009   Past Medical History:  Diagnosis Date  . Anxiety   . Arthritis   . Back pain, chronic   . Bipolar 1 disorder ([place])   . Chronic fatigue syndrome   . DDD (degenerative disc disease), lumbar   . Depression   . Fibromyalgia   . IUD 2012  . MTHFR gene mutation [facility])          Review of  Systems  All other systems reviewed and are negative.      Objective:   Physical Exam  Constitutional: She is oriented to person, place, and time. She appears well-developed and well-nourished.  HENT:  Head: Normocephalic and atraumatic.  Neck: Normal range of motion. Neck supple.  Cardiovascular: Normal rate, regular rhythm and normal heart sounds.   Pulmonary/Chest: Effort normal and breath sounds normal.  Abdominal: Soft. Bowel sounds are normal.  Neurological: She is alert and oriented to person, place, and time.  Psychiatric: She has a normal mood and affect. Her behavior is normal.          Assessment & Plan:  Marland KitchenMarland KitchenSalimah was seen today for establish care.  Diagnoses and all orders for this visit:  Bipolar I disorder ([place])  MTHFR gene mutation ([place])  Fibromyalgia  Chronic fatigue syndrome   I do believe that specialist are needed in this situation.  I will look into genesight testing so that we might find more suitable medications for patient.  I did explain that until bipolar is corrected I do not think she is going to be able to appropriately fight her physical conditions.  I will look into low dose naltrexone.  Follow up in 3 months.

## 2017-09-16 NOTE — Patient Instructions (Signed)
Will look into gene sight testing.

## 2017-09-18 ENCOUNTER — Ambulatory Visit: Payer: No Typology Code available for payment source | Admitting: Physician Assistant

## 2017-09-20 ENCOUNTER — Encounter: Payer: Self-pay | Admitting: Physician Assistant

## 2017-09-20 ENCOUNTER — Telehealth: Payer: Self-pay | Admitting: Physician Assistant

## 2017-09-20 DIAGNOSIS — E7212 Methylenetetrahydrofolate reductase deficiency: Secondary | ICD-10-CM | POA: Insufficient documentation

## 2017-09-20 DIAGNOSIS — Z1589 Genetic susceptibility to other disease: Secondary | ICD-10-CM | POA: Insufficient documentation

## 2017-09-20 NOTE — Telephone Encounter (Signed)
Does genesight testing have rates for uninsured.

## 2017-09-22 ENCOUNTER — Encounter: Payer: Self-pay | Admitting: Psychology

## 2017-09-22 NOTE — Telephone Encounter (Signed)
Awesome will you let patient know.

## 2017-09-22 NOTE — Telephone Encounter (Signed)
Please help me remember to reach out to patient once the rep comes with the kits. Thank you.    Patient advised.

## 2017-09-22 NOTE — Telephone Encounter (Signed)
I called Genesight and they do have a patient assistance program. I gave them [place] information and the rep with reach out to the office to bring the kits and information.   Rep Albertine Daesha - [phone]

## 2017-09-23 ENCOUNTER — Encounter: Payer: Self-pay | Admitting: Neurology

## 2017-09-23 ENCOUNTER — Ambulatory Visit (INDEPENDENT_AMBULATORY_CARE_PROVIDER_SITE_OTHER): Payer: No Typology Code available for payment source | Admitting: Physician Assistant

## 2017-09-23 ENCOUNTER — Encounter: Payer: Self-pay | Admitting: Physician Assistant

## 2017-09-23 VITALS — BP 138/98 | HR 79 | Ht 67.0 in | Wt 165.0 lb

## 2017-09-23 DIAGNOSIS — G4452 New daily persistent headache (NDPH): Secondary | ICD-10-CM

## 2017-09-23 DIAGNOSIS — M26609 Unspecified temporomandibular joint disorder, unspecified side: Secondary | ICD-10-CM

## 2017-09-23 MED ORDER — ORPHENADRINE CITRATE ER 100 MG PO TB12: 100.0000 mg | ORAL_TABLET | Freq: Two times a day (BID) | ORAL | 1 refills | Status: DC

## 2017-09-23 MED ORDER — DEXAMETHASONE SODIUM PHOSPHATE 4 MG/ML IJ SOLN
4.0000 mg | Freq: Once | INTRAMUSCULAR | Status: AC
  Administered 2017-09-23: 4 mg via INTRAMUSCULAR

## 2017-09-23 MED ORDER — KETOROLAC TROMETHAMINE 60 MG/2ML IM SOLN
60.0000 mg | Freq: Once | INTRAMUSCULAR | Status: AC
  Administered 2017-09-23: 60 mg via INTRAMUSCULAR

## 2017-09-23 NOTE — Progress Notes (Signed)
Subjective:    Patient ID: Frances [name], female    DOB: 05/10/1976, 41 y.o.   MRN: [id number]  HPI  Patient is a 41 yo female who presents to the clinic with headache for the last week. She has a hx of TMJ and recently had her teeth cleaned. She describes her whole head as "tight". Her jaw does "lock" at times. She is currently not on any muscle relaxers and she is used to be on many. She has some blurry vision. No auras. Some nausea. She is almost finished with prednisone burst. No fever, chills, neck stiffness.   .. Active Ambulatory Problems    Diagnosis Date Noted  . FEVER, RECURRENT 04/02/2009  . HYPOTHYROIDISM 02/10/2008  . INSOMNIA-SLEEP DISORDER-UNSPEC 02/10/2008  . DERMATITIS, ALLERGIC 06/05/2008  . 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 FIBROMYALGIA 02/10/2008  . FATIGUE 05/05/2008  . IRON, SERUM, ELEVATED 02/10/2008  . DYSPNEA 10/15/2011  . Bipolar I disorder ([place]) 09/16/2017  . Fibromyalgia 09/16/2017  . Chronic fatigue syndrome 09/16/2017  . MTHFR gene mutation ([place]) 09/20/2017  . TMJ (temporomandibular joint syndrome) 09/26/2017  . New daily persistent headache 09/26/2017   Resolved Ambulatory Problems    Diagnosis Date Noted  . SINUSITIS- ACUTE-NOS 03/24/2008  . URI 01/17/2009  . ANKLE PAIN, BILATERAL 07/28/2008  . BACK PAIN 10/13/2008  . OPEN WOUND FT NO TOE ALONE WITHOUT MENTION COMP 03/28/2009   Past Medical History:  Diagnosis Date  . Anxiety   . Arthritis   . Back pain, chronic   . Bipolar 1 disorder ([place])   . Chronic fatigue syndrome   . DDD (degenerative disc disease), lumbar   . Depression   . Fibromyalgia   . IUD 2012  . MTHFR gene mutation [facility])         Review of Systems  All other systems reviewed and are negative.      Objective:   Physical Exam  Constitutional: She is oriented to person, place, and time. She appears well-developed and well-nourished.  HENT:  Head: Normocephalic and atraumatic.  Right  Ear: External ear normal.  Left Ear: External ear normal.  Tenderness over TMJ, bilaterally.  Negative for maxillary/frontal sinus tenderness.   Eyes: Pupils are equal, round, and reactive to light. Conjunctivae and EOM are normal.  Neck: Normal range of motion. Neck supple. No thyromegaly present.  Cardiovascular: Normal rate, regular rhythm and normal heart sounds.   Pulmonary/Chest: Effort normal and breath sounds normal.  Lymphadenopathy:    She has no cervical adenopathy.  Neurological: She is alert and oriented to person, place, and time.  Psychiatric: She has a normal mood and affect. Her behavior is normal.          Assessment & Plan:  Marland KitchenMarland KitchenAlyra was seen today for headache.  Diagnoses and all orders for this visit:  TMJ (temporomandibular joint syndrome) -     orphenadrine (NORFLEX) 100 MG tablet; Take 1 tablet (100 mg total) by mouth 2 (two) times daily. -     ketorolac (TORADOL) injection 60 mg; Inject 2 mLs (60 mg total) into the muscle once. -     dexamethasone (DECADRON) injection 4 mg; Inject 1 mL (4 mg total) into the muscle once.  New daily persistent headache -     ketorolac (TORADOL) injection 60 mg; Inject 2 mLs (60 mg total) into the muscle once. -     dexamethasone (DECADRON) injection 4 mg; Inject 1 mL (4 mg total) into the muscle once.  Continue on naproxen.  Finish prednisone.  HO for TMJ exercises. Consider heat.  Norflex as new muscle relaxer to try.  HP PHysical therapy for TMJ and upper back strain.   Call with any new neurosymptoms for CT.

## 2017-09-23 NOTE — Patient Instructions (Addendum)
Continue on naproxen.  Finish prednisone.  HO for TMJ exercises. Consider heat.  Norflex as new muscle relaxer to try.  HP PHysical therapy for TMJ and upper back strain.   Call with any new neurosymptoms for CT.   Post-Concussion Syndrome Post-concussion syndrome is the symptoms that can occur after a head injury. These symptoms can last from weeks to months. Follow these instructions at home:  Take medicines only as told by your doctor.  Do not take aspirin.  Sleep with your head raised to help with headaches.  Avoid activities that can cause another head injury. ? Do not play contact sports like football, hockey, soccer, or basketball. ? Do not do other risky activities like downhill skiing, martial arts, or horseback riding until your doctor says it is okay.  Keep all follow-up visits as told by your doctor. This is important. Contact a doctor if:  You have a harder time: ? Paying attention. ? Focusing. ? Remembering. ? Learning new information. ? Dealing with stress.  You need more time to complete tasks.  You are easily bothered (irritable).  You have more symptoms. Get help if you have any of these symptoms for more than two weeks after your injury:  Long-lasting (chronic) headaches.  Dizziness.  Trouble balancing.  Feeling sick to your stomach (nauseous).  Trouble with your vision.  Noise or light bothers you more.  Depression.  Mood swings.  Feeling worried (anxious).  Easily bothered.  Memory problems.  Trouble concentrating or paying attention.  Sleep problems.  Feeling tired all of the time.  Get help right away if:  You feel confused.  You feel very sleepy.  You are hard to wake up.  You feel sick to your stomach.  You keep throwing up (vomiting).  You feel like you are moving when you are not (vertigo).  Your eyes move back and forth very quickly.  You start shaking (convulsing) or pass out (faint).  You have very bad  headaches that do not get better with medicine.  You cannot use your arms or legs like normal.  One of the black centers of your eyes (pupils) is bigger than the other.  You have clear or bloody fluid coming from your nose or ears.  Your problems get worse, not better. This information is not intended to replace advice given to you by your health care provider. Make sure you discuss any questions you have with your health care provider. Document Released: 01/15/2005 Document Revised: 05/15/2016 Document Reviewed: 03/15/2014 Elsevier Interactive Patient Education  2018 [employer].

## 2017-09-26 ENCOUNTER — Encounter: Payer: Self-pay | Admitting: Physician Assistant

## 2017-09-26 DIAGNOSIS — M26609 Unspecified temporomandibular joint disorder, unspecified side: Secondary | ICD-10-CM | POA: Insufficient documentation

## 2017-09-26 DIAGNOSIS — G4452 New daily persistent headache (NDPH): Secondary | ICD-10-CM | POA: Insufficient documentation

## 2017-10-05 ENCOUNTER — Ambulatory Visit: Admission: RE | Admit: 2017-10-05 | Discharge: 2017-10-05 | Disposition: A | Discharge status: Home / Self Care

## 2017-10-05 ENCOUNTER — Ambulatory Visit
Admission: RE | Admit: 2017-10-05 | Discharge: 2017-10-05 | Disposition: A | Discharge status: Home / Self Care | Attending: Rheumatology | Admitting: Rheumatology

## 2017-10-05 ENCOUNTER — Other Ambulatory Visit: Payer: Self-pay | Admitting: *Deleted

## 2017-10-05 ENCOUNTER — Ambulatory Visit (HOSPITAL_COMMUNITY): Payer: No Typology Code available for payment source | Admitting: Psychiatry

## 2017-10-05 ENCOUNTER — Other Ambulatory Visit: Payer: Self-pay

## 2017-10-05 ENCOUNTER — Telehealth: Payer: Self-pay | Admitting: Physician Assistant

## 2017-10-05 DIAGNOSIS — R0602 Shortness of breath: Secondary | ICD-10-CM

## 2017-10-05 DIAGNOSIS — M2560 Stiffness of unspecified joint, not elsewhere classified: Principal | ICD-10-CM

## 2017-10-05 DIAGNOSIS — R52 Pain, unspecified: Secondary | ICD-10-CM

## 2017-10-05 DIAGNOSIS — R5383 Other fatigue: Secondary | ICD-10-CM

## 2017-10-05 DIAGNOSIS — M6281 Muscle weakness (generalized): Secondary | ICD-10-CM

## 2017-10-05 DIAGNOSIS — G8929 Other chronic pain: Secondary | ICD-10-CM

## 2017-10-05 DIAGNOSIS — M2540 Effusion, unspecified joint: Principal | ICD-10-CM

## 2017-10-05 MED ORDER — CETIRIZINE HCL 10 MG PO TABS: 10.0000 mg | ORAL_TABLET | Freq: Every day | ORAL | 5 refills | Status: DC

## 2017-10-05 MED ORDER — MOMETASONE FUROATE 50 MCG/ACT NA SUSP
2.0000 | Freq: Every day | NASAL | 3 refills | Status: DC
Start: 2017-10-05 — End: 2018-01-12

## 2017-10-05 MED ORDER — MONTELUKAST SODIUM 10 MG PO TABS: 10.0000 mg | ORAL_TABLET | Freq: Every day | ORAL | 5 refills | Status: DC

## 2017-10-05 NOTE — Telephone Encounter (Signed)
Pt called highly upset. She states she has left message on CMA's voicemail and has not gotten a call back. At her last visit she gave all her meds to [place] and was told they would be taken care of  and they were all going to different pharmacies. She is requesting refill on singulair, generic zyrtex and nasonex  Through [facility] - telephone [phone].  Thank you

## 2017-10-05 NOTE — Telephone Encounter (Signed)
Meds printed & placed in providers basket to be faxed.

## 2017-10-05 NOTE — Telephone Encounter (Signed)
Routing to Provider in office for review. Pt request Rx be faxed to [facility]: [phone]

## 2017-10-07 ENCOUNTER — Ambulatory Visit (HOSPITAL_COMMUNITY): Payer: No Typology Code available for payment source | Admitting: Psychiatry

## 2017-10-08 ENCOUNTER — Encounter (HOSPITAL_COMMUNITY): Payer: Self-pay | Admitting: Psychiatry

## 2017-10-08 ENCOUNTER — Telehealth: Payer: Self-pay | Admitting: Physician Assistant

## 2017-10-08 ENCOUNTER — Ambulatory Visit (INDEPENDENT_AMBULATORY_CARE_PROVIDER_SITE_OTHER): Payer: Self-pay | Admitting: Psychiatry

## 2017-10-08 VITALS — Ht 67.0 in | Wt 163.0 lb

## 2017-10-08 DIAGNOSIS — M791 Myalgia, unspecified site: Secondary | ICD-10-CM

## 2017-10-08 DIAGNOSIS — R4587 Impulsiveness: Secondary | ICD-10-CM

## 2017-10-08 DIAGNOSIS — Z8659 Personal history of other mental and behavioral disorders: Secondary | ICD-10-CM

## 2017-10-08 DIAGNOSIS — F0630 Mood disorder due to known physiological condition, unspecified: Secondary | ICD-10-CM

## 2017-10-08 DIAGNOSIS — F3160 Bipolar disorder, current episode mixed, unspecified: Secondary | ICD-10-CM

## 2017-10-08 DIAGNOSIS — Z811 Family history of alcohol abuse and dependence: Secondary | ICD-10-CM

## 2017-10-08 DIAGNOSIS — R454 Irritability and anger: Secondary | ICD-10-CM

## 2017-10-08 DIAGNOSIS — Z818 Family history of other mental and behavioral disorders: Secondary | ICD-10-CM

## 2017-10-08 DIAGNOSIS — R4586 Emotional lability: Secondary | ICD-10-CM

## 2017-10-08 DIAGNOSIS — Z975 Presence of (intrauterine) contraceptive device: Secondary | ICD-10-CM

## 2017-10-08 MED ORDER — LAMOTRIGINE 25 MG PO TABS: 25.0000 mg | ORAL_TABLET | Freq: Every day | ORAL | 0 refills | Status: DC

## 2017-10-08 MED FILL — lamoTRIgine 25 MG TABS: 25 | 37 days supply | Qty: 60 | Fill #0

## 2017-10-08 NOTE — Progress Notes (Signed)
Psychiatric Initial Adult Assessment   Patient Identification: Frances [name] MRN:  [id number] Date of Evaluation:  10/08/2017 Referral Source: [facility] primary care office Chief Complaint:   Chief Complaint    Establish Care     Visit Diagnosis:    ICD-10-CM   1. Mood disorder in conditions classified elsewhere F06.30   2. Bipolar I disorder, most recent episode mixed ([place]) F31.60   3. History of borderline personality disorder Z86.59     History of Present Illness:  41 years old, single Caucasian female referred by primary care physician for management of mood symptoms  Patient states she has mood swings easily irritable hypersensitivity to medication self-harm behaviors like head nodding punching wall out of anger sensitivity to rejection and she mentioned she has read about borderline personality and it fits the picture She also has mood symptoms including feeling of euphoria or elevated mood distractibility increased energy for a few days in the past she has suffered from depression. Says that she's been on different medication but apparently medication has made her worse she is sensitive to medication she may also get a gene testing She was with family services was discharged she didn't like the psychiatist and was pissed after being discharged . 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 Prozac, effexor , neurontin all made her not better but some worse in one way or other  Aggravating factor: back condition. RA or psoriatic  arthritis. DJD Modifying factor: farm, animals   Associated Signs/Symptoms: Depression Symptoms:  depressed mood, difficulty concentrating, (Hypo) Manic Symptoms:  Distractibility, Impulsivity, Irritable Mood, Labiality of Mood, Anxiety Symptoms:  Excessive Worry, Psychotic Symptoms:  denies PTSD Symptoms: Had a traumatic exposure:  childhood sucked, did not elaborate Hypervigilance:  Yes  Past Psychiatric History: mood symptoms, depression.   Previous Psychotropic Medications: Yes   Substance Abuse History in the last 12 months:  No.  Consequences of Substance Abuse: NA  Past Medical History:  Past Medical History:  Diagnosis Date  . Anxiety   . Arthritis   . Back pain, chronic   . Bipolar 1 disorder ([place])   . Chronic fatigue syndrome   . DDD (degenerative disc disease), lumbar   . Depression   . Fibromyalgia   . IUD 2012   Mirena  . MTHFR gene mutation [facility])     Past Surgical History:  Procedure Laterality Date  . COLONOSCOPY WITH PROPOFOL N/A 02/25/2017   Procedure: COLONOSCOPY WITH PROPOFOL;  Surgeon: Arta Silence, MD;  Location: [facility] ENDOSCOPY;  Service: Endoscopy;  Laterality: N/A;  . left knee lateral release  1993  . scar tisue removal  03/2005   left ankle  . TONSILLECTOMY  age 46's    Family Psychiatric History: uncle. depression  Family History:  Family History  Problem Relation Age of Onset  . Diabetes Mother   . Stroke Mother   . Lupus Mother   . Hypertension Mother   . Congestive Heart Failure Mother   . Mental illness Brother  Not clear what his diagnosis is--may be related to previous drug use.  . Cancer Maternal Grandmother        colon  . Depression Maternal Uncle   . Alcohol abuse Maternal Uncle   . Diabetes Maternal Grandfather     Social History:   Social History   Social History  . Marital status: Single    Spouse name: N/A  . Number of children: 0  . Years of education: N/A   Occupational History  . Pet sitter  at times    Social History Main Topics  . Smoking status: Never Smoker  . Smokeless tobacco: Never Used  . Alcohol use No  . Drug use: No  . Sexual activity: Yes    Birth control/ protection: IUD   Other Topics Concern  . None   Social History Narrative   Originally from [place], outside of [place].    Moved here permanently 2012.   Intermittently worked on a farm.   Lives with many cats--3 plus fosters cats.        Additional Social History: Grew up with her mom and sister and brother said it sucked did not elaborate more than that but overall she did finish high school had a few friends at home said it was not well although there is no physical or sexual abuse that she elaborated. She is working with Scientist, physiological cannot function because of her mood instability or getting irritable easily trying to get disability   Allergies:   Allergies  Allergen Reactions  . Metaxalone Hives    Metabolic Disorder Labs: Lab Results  Component Value Date   HGBA1C 5.6 03/04/2016   No results found for: PROLACTIN Lab Results  Component Value Date   CHOL 152 03/04/2016   TRIG 81 03/04/2016   HDL 37 (L) 03/04/2016   LDLCALC 99 03/04/2016   LDLCALC 71 12/20/2007     Current Medications: Current Outpatient Prescriptions  Medication Sig Dispense Refill  . Carboxymethylcellul-Glycerin (LUBRICATING EYE DROPS OP) Apply 1 drop to eye 2 (two) times daily as needed (dry eyes).    . cetirizine (ZYRTEC) 10 MG tablet Take 1 tablet (10 mg total) by mouth daily. 30 tablet 5  . Glucosamine HCl (GLUCOSAMINE PO) Take 1 tablet by mouth 2 (two) times daily.    [name] HYDROcodone-acetaminophen (NORCO) 5-325 MG per tablet Take 1 tablet by mouth every 6 (six) hours as needed for moderate pain.     [name] lamoTRIgine (LAMICTAL) 25 MG tablet Take 1 tablet (25 mg total) by mouth daily. Take one tablet daily for a week and then start taking 2 tablets. 60 tablet 0  . levonorgestrel (MIRENA, 52 MG,) 20  MCG/24HR IUD 1 each by Intrauterine route once.    [name] MAGNESIUM GLYCINATE PLUS PO Take 240 mg by mouth.    . mometasone (NASONEX) 50 MCG/ACT nasal spray Place 2 sprays into the nose daily. 17 g 3  . montelukast (SINGULAIR) 10 MG tablet Take 1 tablet (10 mg total) by mouth at bedtime. 30 tablet 5  . naproxen (NAPROSYN) 500 MG tablet Take 500 mg by mouth 2 (two) times daily with a meal.    . Omega-3 Fatty Acids (FISH OIL PO) Take by mouth 2 (two) times daily.    . orphenadrine (NORFLEX) 100 MG tablet Take 1 tablet (100 mg total) by mouth 2 (two) times daily. 60 tablet 1  . Oxycodone HCl 10 MG TABS Take 5 mg by mouth 2 (two) times daily as needed (  pain).    . Polyethylene Glycol 3350 (MIRALAX PO) Take by mouth.    . traMADol (ULTRAM) 50 MG tablet Take 25-100 mg by mouth 2 (two) times daily as needed for moderate pain.     No current facility-administered medications for this visit.     Neurologic: Headache: No Seizure: No Paresthesias:No  Musculoskeletal: Strength & Muscle Tone: within normal limits Gait & Station: normal Patient leans: no lean  Psychiatric Specialty Exam: Review of Systems  Cardiovascular: Negative for chest pain.  Musculoskeletal: Positive for myalgias.  Skin: Negative for rash.  Psychiatric/Behavioral: Positive for depression.    Height 5\' 7"  (1.702 m), weight 163 lb (73.9 kg).Body mass index is 25.53 kg/m.  General Appearance: Casual  Eye Contact:  Minimal  Speech:  Normal Rate  Volume:  Normal  Mood:  Irritable  Affect:  Congruent  Thought Process:  Goal Directed and Descriptions of Associations: Intact  Orientation:  Full (Time, Place, and Person)  Thought Content:  Rumination  Suicidal Thoughts:  No  Homicidal Thoughts:  No  Memory:  Immediate;   Fair Recent;   Fair  Judgement:  Poor  Insight:  Shallow  Psychomotor Activity:  Normal  Concentration:  Concentration: Fair and Attention Span: Fair  Recall:  [employer] of Knowledge:Fair  Language:  Fair  Akathisia:  No  Handed:  Right  AIMS (if indicated):    Assets:  Desire for Improvement  ADL's:  Intact  Cognition: WNL  Sleep:  variable    Treatment Plan Summary: Medication management and Plan as follows  1. Mood disorder unspecified: maybe related to physical health, arthritis, back condition 2. Bipolar disorder, mixed: does not have insurance says cannot pay even out of pocket .  Will start lamictal 25mg  increase to 50 in one week. Work in therapy  Primary care to do Gene testing as she is hypersensitive to meds Also has [employer] for more then 5 years. She is considering all factors in last 10 years for any effect on mood Medicines have made her worse at times GAD: refer to therapy will work on stabilizing mood as of now Borderline personality; schedule therapy also is scheduled swith DBT group FU 3-4 for med review. Will keep low dose lamictal for now and rea assess. She understands meds take time to work and have safety plan or support numbers to call for any concern as well can call clinic for early appointment or report to local ED     Merian Capron, MD 10/18/201811:53 AM

## 2017-10-08 NOTE — Telephone Encounter (Signed)
Patient walked in as I was locking up for lunch and was acting mad, she states she just got done seeing behavorial Health downstairs and she does not like the Dr. Ledell Peoples there and it is upset with [place] because she has not heard anything about her Genesight and also would like a new referral to GYN. Patients said she should just kill herself and she seemed mad and walked out. I immediately called downstairs to [place] answered and  said she would let pt's Dr. Gwyndolyn Kaufman. All my physicians are gone to lunch at this time and our office is actually closed

## 2017-10-08 NOTE — Telephone Encounter (Signed)
She was seen earlier and stated she was mad on the doctors upstairs as they are not responding to my calls. She said she will go upstairs She has history of impulsivity and changing doctors as she may have borderline personality.  She said she may not like our therapist either Recommended therapy. Left not homicidal or suicidal. Have started her medication and would take time to build up Has safety plan to talk or connect with friends or call 911 or report to local ED for suicidal or urgent concerns

## 2017-10-08 NOTE — Telephone Encounter (Signed)
Pt upset because she has not got her Genesight testing Kit as talked about in previous note so can someone help pt with this and F/u. Pt is also requesting a GYN referral

## 2017-10-08 NOTE — Patient Instructions (Signed)
Refer to therapy 

## 2017-10-13 ENCOUNTER — Ambulatory Visit (INDEPENDENT_AMBULATORY_CARE_PROVIDER_SITE_OTHER): Payer: Self-pay | Admitting: Licensed Clinical Social Worker

## 2017-10-13 DIAGNOSIS — F0630 Mood disorder due to known physiological condition, unspecified: Secondary | ICD-10-CM

## 2017-10-13 DIAGNOSIS — F3160 Bipolar disorder, current episode mixed, unspecified: Secondary | ICD-10-CM

## 2017-10-13 DIAGNOSIS — Z8659 Personal history of other mental and behavioral disorders: Secondary | ICD-10-CM
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
## 2017-10-15 ENCOUNTER — Telehealth (HOSPITAL_COMMUNITY): Payer: Self-pay | Admitting: *Deleted

## 2017-10-15 NOTE — Telephone Encounter (Signed)
Pt would like to speak provider about medication. Pt states since starting Lamictal she is having " major meltdown" and " major blow up" toward people. Pt states she is becoming more aggressive. Pt is schedule for therapy on 10/30. Please advise.   [name] Sefcik cb # [phone].

## 2017-10-16 NOTE — Telephone Encounter (Signed)
Return telephone to pt. Per Dr. De Nurse, informed pt Lamictal rx was a small dose, pt may stop medication.  Per pt, pt has not tired olanzapine or Abilify. Pt states she will stop Lamictal for the next 3 days and follow up with office on Monday if olanzapine or Abilify will need to be called into pharmacy. Per pt, she is schedule to start DBT therapy on 10/22/17. Nothing further is need at this time.

## 2017-10-16 NOTE — Telephone Encounter (Signed)
History suggestive of sensitivity to medications. lamictal was a small dose. Can still stop Ask if she has been on olanzapine or abilify and we can consider to start a small dose Patient to attend therapy and DBT for her condition as well

## 2017-10-20 ENCOUNTER — Ambulatory Visit (INDEPENDENT_AMBULATORY_CARE_PROVIDER_SITE_OTHER): Payer: Self-pay | Admitting: Licensed Clinical Social Worker

## 2017-10-20 DIAGNOSIS — F3160 Bipolar disorder, current episode mixed, unspecified: Secondary | ICD-10-CM

## 2017-10-20 DIAGNOSIS — Z8659 Personal history of other mental and behavioral disorders: Secondary | ICD-10-CM

## 2017-10-20 DIAGNOSIS — F0630 Mood disorder due to known physiological condition, unspecified: Secondary | ICD-10-CM

## 2017-10-20 NOTE — Telephone Encounter (Signed)
Left VM for Pt advising we do have the genesight test in, so she can come in for a nurse visit to complete.
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
## 2017-10-21 ENCOUNTER — Ambulatory Visit (INDEPENDENT_AMBULATORY_CARE_PROVIDER_SITE_OTHER): Payer: Self-pay | Admitting: Physician Assistant

## 2017-10-21 ENCOUNTER — Encounter: Payer: Self-pay | Admitting: Physician Assistant

## 2017-10-21 VITALS — BP 126/82 | HR 79 | Ht 67.0 in | Wt 168.0 lb

## 2017-10-21 DIAGNOSIS — Z789 Other specified health status: Secondary | ICD-10-CM

## 2017-10-21 DIAGNOSIS — Z30432 Encounter for removal of intrauterine contraceptive device: Secondary | ICD-10-CM

## 2017-10-21 NOTE — Progress Notes (Signed)
   Subjective:    Patient ID: Frances [name], female    DOB: [birth year], 41 y.o.   MRN: [id number]  HPI Pt is a 41 yo female who presents to the clinic for IUD removal and genesight testing.   Patient was seen by Dr. Primitivo Gauze a psychiatrist and recommended that she might try getting the Mirena removed and see if it could be causing some of her mood symptoms.  She feels like this is worth a shot.  She has had 10 years worth of medication reactions or not working. She wants genesight to see if we can determine some medications that might work for her.   .. Active Ambulatory Problems    Diagnosis Date Noted  . FEVER, RECURRENT 04/02/2009  . HYPOTHYROIDISM 02/10/2008  . INSOMNIA-SLEEP DISORDER-UNSPEC 02/10/2008  . DERMATITIS, ALLERGIC 06/05/2008  . 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 FIBROMYALGIA 02/10/2008  . FATIGUE 05/05/2008  . IRON, SERUM, ELEVATED 02/10/2008  . DYSPNEA 10/15/2011  . Bipolar I disorder ([place]) 09/16/2017  . Fibromyalgia 09/16/2017  . Chronic fatigue syndrome 09/16/2017  . MTHFR gene mutation ([place]) 09/20/2017  . TMJ (temporomandibular joint syndrome) 09/26/2017  . New daily persistent headache 09/26/2017   Resolved Ambulatory Problems    Diagnosis Date Noted  . SINUSITIS- ACUTE-NOS 03/24/2008  . URI 01/17/2009  . ANKLE PAIN, BILATERAL 07/28/2008  . BACK PAIN 10/13/2008  . OPEN WOUND FT NO TOE ALONE WITHOUT MENTION COMP 03/28/2009   Past Medical History:  Diagnosis Date  . Anxiety   . Arthritis   . Back pain, chronic   . Bipolar 1 disorder ([place])   . Chronic fatigue syndrome   . DDD (degenerative disc disease), lumbar   . Depression   . Fibromyalgia   . IUD 2012  . MTHFR gene mutation [facility])       Review of Systems See HPI.     Objective:   Physical Exam  Constitutional: She is oriented to person, place, and time. She appears well-developed and well-nourished.  HENT:  Head: Normocephalic and atraumatic.  Cardiovascular:  Normal rate, regular rhythm and normal heart sounds.   Genitourinary:  Genitourinary Comments: Cervical os located and mirena strings found. Via long head forceps grabbed strings and pulled out with no complications other than some discomfort.   Neurological: She is alert and oriented to person, place, and time.  Psychiatric: Her behavior is normal.          Assessment & Plan:  Marland KitchenMarland KitchenDiagnoses and all orders for this visit:  Encounter for IUD removal  Medication intolerance   IUd removed today. Consider hormonal testing once out for a month.   Will get genesight testing done to see if we can find medications that patient does respond too.

## 2017-10-30 ENCOUNTER — Ambulatory Visit (INDEPENDENT_AMBULATORY_CARE_PROVIDER_SITE_OTHER): Payer: Self-pay | Admitting: Licensed Clinical Social Worker

## 2017-10-30 ENCOUNTER — Telehealth (HOSPITAL_COMMUNITY): Payer: Self-pay | Admitting: Licensed Clinical Social Worker

## 2017-10-30 DIAGNOSIS — F0630 Mood disorder due to known physiological condition, unspecified: Secondary | ICD-10-CM

## 2017-10-30 DIAGNOSIS — F3160 Bipolar disorder, current episode mixed, unspecified: Secondary | ICD-10-CM

## 2017-10-30 DIAGNOSIS — Z8659 Personal history of other mental and behavioral disorders: Secondary | ICD-10-CM

## 2017-10-30 NOTE — Progress Notes (Addendum)
THERAPIST PROGRESS NOTE  Session Time: 12:05 PM to 1 PM  Participation Level: Active  Behavioral Response: CasualAlertEuthymic, at end of session patient became irritable and upset related to not being able to accomplish some of the things she had plan for session  Type of Therapy: Individual Therapy  Treatment Goals addressed:  patient learn anger management skills, emotional regulation skills coping skills to help with management of emotions and decrease in symptoms  Interventions: CBT, DBT, Solution Focused, Strength-based and Supportive  Summary: Frances [name] is a 41 y.o. female who presents with IUD out and emotions all over the place, crying over video, crying for no significant reason, and other situations where crying easily. Shares that off of medication and a "different person". She is not explosive and better management of emotions. She is able to get over things quicker. Also shares there have been moments where she's been pushed. Shared some of things she is doing in classes with DBT, anger management and what learning from peer support. Discussed with patient how DBT strategies help deal with overwhelming emotions. Patient shared that she has learned about mindfulness but cannot do imagery, and goes to a safe place she doesn't have a safe place and find she can't complete imagery exercise. Has worked on deep breathing, has been practicing it at times when happy so she will be able to apply it when she needs it but also has applied it to deal with situations such as doing cavity filled and was painful and to manage his frustrations when driving. She relates practicing relaxation of being a hot bath, deep breathing, listening to heart beat, discussed listening to music and also practicing deep breathing to be helpful. Reviewed to videos to further gain insight to mindfulness to help patient better understand why deep breathing is helpful for her in getting distance and better  management for emotions. Patient looking for good application for meditation. Patient describes still having trouble with implementing skills in moment. Discussed she doesn't like the holidays. Also shared the issue that classes get discontinued. Will plan to start Wrap again. 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 Reviewed session and patient said did not do what she had planned that she wanted to share with therapist some things she had wrote, now would have to leave session dealing with it on her own. Therapist discussed relationship of getting to know patient better would be helpful and how to approach sessions and the next time patient would lead off so session would address things she needed to address. Therapist pointed out some of the things covered from session to identify ways it may be helpful. 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 Suicidal/Homicidal: No  Therapist Response: Assessed patient's current functioning per report. Identified that patient has not had explosive episodes and patient continues to tie this to not being on medication. Discussed learning DBT skills and therapist explained that it helps people learn skills who have trouble managing overwhelming emotions and tied this being relevant for patient and helping her to find effective ways to manage her emotions. Introduced to video as to give further insight  to mindfulness and meditation. Discussed how mindfulness is to know what is in your head at any given moment without getting carried away with it. Discussed how this can be useful for patient, discussed how meditation is  exercise for the brain and also provided positive feedback for patient's applying this as a regular practice and also as a way to  help her deal with stressful situations through deep breathing. Discussed how people can develop symptoms of borderline personality related to environment growing up patient identified being a latch key kid and bullying from family and relate that children learn organizing models and terms of relationships and management emotions and can get distorted and disrupted, and identified with patient not having a role model available and emotions not being reflected and acknowledge was disruptive. Challenged patient on negative self talk and continue to work on developing relationship with patient. Provided supportive and strength-based intervention.  Plan: Return again in 1 weeks.2.therapist will continue to work with patient on relationship building including person centered approaches to help therapeutic relationship.3.Therapist continued to work with patient on education to diagnosis in learning emotional regulation and anger management skills.4. Therapist work with patient on learning emotional regulation skills to help her and management of symptoms  Diagnosis: Axis I:  mood disorder in conditions classified elsewhere, bipolar disorder most recent episode mixed,history of borderline personality disorder    Axis II: No diagnosis    Cordella Register, LCSW 10/30/2017

## 2017-10-30 NOTE — Telephone Encounter (Signed)
Patient was here to see [facility].  At check out, patient states that her PCP will not call her back.  I called PCP to discuss when the Genesight results will be in. Per [facility] [facility] does have the results. I was then transferred to the front desk and spoke to [place] and we made an appointment for her to see Luvenia Starch on Tuesday at 10:20am.  Patient states she will keep appointment with Dr. De Nurse on Thursday the 15th.

## 2017-11-03 ENCOUNTER — Telehealth: Payer: Self-pay | Admitting: *Deleted

## 2017-11-03 ENCOUNTER — Ambulatory Visit (INDEPENDENT_AMBULATORY_CARE_PROVIDER_SITE_OTHER): Payer: Self-pay | Admitting: Licensed Clinical Social Worker

## 2017-11-03 ENCOUNTER — Ambulatory Visit (INDEPENDENT_AMBULATORY_CARE_PROVIDER_SITE_OTHER): Payer: Self-pay | Admitting: Physician Assistant

## 2017-11-03 VITALS — BP 127/90 | HR 88 | Ht 67.01 in | Wt 168.0 lb

## 2017-11-03 DIAGNOSIS — Z8659 Personal history of other mental and behavioral disorders: Secondary | ICD-10-CM

## 2017-11-03 DIAGNOSIS — F0630 Mood disorder due to known physiological condition, unspecified: Secondary | ICD-10-CM

## 2017-11-03 DIAGNOSIS — F319 Bipolar disorder, unspecified: Secondary | ICD-10-CM

## 2017-11-03 DIAGNOSIS — F3160 Bipolar disorder, current episode mixed, unspecified: Secondary | ICD-10-CM

## 2017-11-03 NOTE — Progress Notes (Addendum)
THERAPIST PROGRESS NOTE  Session Time: 11:10 AM to 12:01 PM  Participation Level: Active  Behavioral Response: CasualAlertDysphoric, Euphoric, Irritable and Patient describes mood lability during session as different topics were discussed  Type of Therapy: Individual Therapy  Treatment Goals addressed:  patient learn anger management skills, emotional regulation skills coping skills to help with management of emotions and decrease in symptoms, learn coping skills  Interventions: DBT, Solution Focused, Strength-based and Supportive  Summary: Frances [name] is a 41 y.o. female who presents with briefly reviewing results of Gene test and some of the meds that she should be able to take have caused her problems, once she metabolizes quicker she should be able to take at a higher dosage but relates some of the results such as Effexor that could be helpful actually mixes with Vicodin to cause problems. Relates woke up yesterday in a foul, mood, grumpy, irritable, could bite somebody's head off. Describes waking up depressed, therapist explored triggers and patient said that she could not identify any. Patient related IUD out, so hormones probably are part of the problem. Patient to see psychiatrist but thinks it is may be best to go without meds. Talked about a low dose of naltrexone that would help with mood symptoms, pain, but doctors not willing to prescribe. Expressed her feeling of "being tired of everything", and at this point although she only takes opiates needed she feels like just following strategy of taking all opiates prescribed because "doesn't care" at this point. If she "trips out without meds" might as well be on meds. Shares that she tolerates hydrocoodone the best, Oxycondone knocks her out, Tramadol she hates but takes if she has to. Describes her mood switches so fast that she "can't stand herself", "feels crappy all the time", feeling better in the evening. Relates that   orthopedic , Dr. Lynann Bologna, manages pain meds. Shared Friday felt good and left session with this therapist feeling terrible, took everything it had not to punch head in wall. Shared that she has no money and can't work, nothing for her, has rely on other people because no money and has to rely on other people. As explored triggers patient relates that [employer] went to [employer] got charged co-pay twice that upset her. Describes difficulty of managing mood that when like this nothing helps her. When like this all feels like crap, no suicidal thoughts in several weeks, now there are back. Patient also identifies not seeing peer support last week as upsetting.(gets services through [facility]). 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 Doesn't get a break from brain. Therapist provided positive feedback for patient finding a coloring app and does this or plays video games when watching the movie after movie and therapist tied this to a relaxation strategies that helpful for patient. Therapist also pointed out humor as an ego strength  Suicidal/Homicidal: Suicidal thoughts but no plan, at end of session, mood improved and plan to go to mental association class  Therapist Response: Assess patient current functioning per report. Patient able to share writings with therapist and therapist input to patient with how expressive and how help and how well patient communicated her experiences and feelings including how she experiences depression and at the same time expressing how certain relationships have really helped her through things. Therapist worked on Economist with patient that a mood state does not last forever to help her  in getting through emotion. And confronting obstacles therapist reinforced determination as an ego strength that will help patient but also help her realizing  her experiences are unique and use by people in general. Discussed emotional regulation that patient's frustration has been a strategy to help communicate to people but also interferes with relationships. Explored ways patient can develop strategies to help regulate emotion as regulated emotions are more effective ways of coping. Discussed different types of problems people have in managing emotions and identified patient not knowing source of trigger may be an issue to work on to help her in managing emotions. Validated patient on feelings and provided strength based and supportive interventions.  Plan: Return again in 1-2 weeks.2.Marland Kitchentherapist will continue to work with patient on relationship building including person centered approaches to help therapeutic relationship.3.Therapist continued to work with patient on education to diagnosis in learning emotional regulation and anger management skills.4. Therapist work with patient on learning emotional regulation skills to help her and management of symptoms  Diagnosis: Axis I:  mood disorder in conditions classified elsewhere, bipolar disorder most recent episode mixed,history of borderline personality disorder    Axis II: No diagnosis    Cordella Register, LCSW 11/03/2017

## 2017-11-03 NOTE — Telephone Encounter (Signed)
Patient came in today and requests that we request 2 bottles of miralax from the health dept. She gave a number for [place] to call which [facility] pharmacy [phone]. Called and essentially called a prescription for the miralax. I think she can get it free from there.

## 2017-11-04 ENCOUNTER — Encounter: Payer: Self-pay | Admitting: Physician Assistant

## 2017-11-04 NOTE — Progress Notes (Signed)
   Subjective:    Patient ID: Frances [name], female    DOB: Oct 08, 1976, 41 y.o.   MRN: [id number]  HPI Pt is a 41 yo female with bipolar 1 disorder, CFS, Anxiety that has had years of reactions to medication. A few weeks ago we removed mirena IUD and did genesight testing. She is here to go over results.   .. Active Ambulatory Problems    Diagnosis Date Noted  . FEVER, RECURRENT 04/02/2009  . HYPOTHYROIDISM 02/10/2008  . INSOMNIA-SLEEP DISORDER-UNSPEC 02/10/2008  . DERMATITIS, ALLERGIC 06/05/2008  . 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 FIBROMYALGIA 02/10/2008  . FATIGUE 05/05/2008  . IRON, SERUM, ELEVATED 02/10/2008  . DYSPNEA 10/15/2011  . Bipolar I disorder ([place]) 09/16/2017  . Fibromyalgia 09/16/2017  . Chronic fatigue syndrome 09/16/2017  . MTHFR gene mutation ([place]) 09/20/2017  . TMJ (temporomandibular joint syndrome) 09/26/2017  . New daily persistent headache 09/26/2017   Resolved Ambulatory Problems    Diagnosis Date Noted  . SINUSITIS- ACUTE-NOS 03/24/2008  . URI 01/17/2009  . ANKLE PAIN, BILATERAL 07/28/2008  . BACK PAIN 10/13/2008  . OPEN WOUND FT NO TOE ALONE WITHOUT MENTION COMP 03/28/2009   Past Medical History:  Diagnosis Date  . Anxiety   . Arthritis   . Back pain, chronic   . Bipolar 1 disorder ([place])   . Chronic fatigue syndrome   . DDD (degenerative disc disease), lumbar   . Depression   . Fibromyalgia   . IUD 2012  . MTHFR gene mutation [facility])       Review of Systems  All other systems reviewed and are negative.      Objective:   Physical Exam  Constitutional: She is oriented to person, place, and time. She appears well-developed and well-nourished.  HENT:  Head: Normocephalic and atraumatic.  Neurological: She is alert and oriented to person, place, and time.  Psychiatric: She has a normal mood and affect. Her behavior is normal.          Assessment & Plan:  Marland KitchenMarland KitchenAntoinetta was seen today for  follow-up.  Diagnoses and all orders for this visit:  Bipolar I disorder ([place])   genesight testing showed multiple medications that patient should not try due to gene interactions and some that she metabolized ultra fast. This information did not support "what patient had found when she tried some of these medications in the past". I discussed I would like for her to follow up with Dr. Pricilla Handler and see if they could try and work on some psych medcations that she can tolerate. Hopefully this information will help [place] in some way. Testing to be scanned into EMR.

## 2017-11-05 ENCOUNTER — Ambulatory Visit (INDEPENDENT_AMBULATORY_CARE_PROVIDER_SITE_OTHER): Payer: Self-pay | Admitting: Psychiatry

## 2017-11-05 ENCOUNTER — Encounter (HOSPITAL_COMMUNITY): Payer: Self-pay | Admitting: Psychiatry

## 2017-11-05 ENCOUNTER — Other Ambulatory Visit: Payer: Self-pay

## 2017-11-05 VITALS — BP 110/68 | HR 86 | Resp 16 | Ht 67.0 in | Wt 170.0 lb

## 2017-11-05 DIAGNOSIS — Z8659 Personal history of other mental and behavioral disorders: Secondary | ICD-10-CM

## 2017-11-05 DIAGNOSIS — F0630 Mood disorder due to known physiological condition, unspecified: Secondary | ICD-10-CM

## 2017-11-05 DIAGNOSIS — F3160 Bipolar disorder, current episode mixed, unspecified: Secondary | ICD-10-CM

## 2017-11-05 MED ORDER — OXCARBAZEPINE 150 MG PO TABS: 150.0000 mg | ORAL_TABLET | Freq: Two times a day (BID) | ORAL | 0 refills | Status: DC

## 2017-11-05 MED ORDER — THIORIDAZINE HCL 10 MG PO TABS: 10.0000 mg | ORAL_TABLET | Freq: Every day | ORAL | 0 refills | Status: DC

## 2017-11-05 NOTE — Progress Notes (Signed)
[facility] Outpatient Follow up visit   Patient Identification: SHANNA UN MRN:  [id number] Date of Evaluation:  11/05/2017 Referral Source: Luvenia Starch primary care office Chief Complaint:   Chief Complaint    Follow-up; Manic Behavior     Visit Diagnosis:  No diagnosis found.  History of Present Illness:  41 years old, single Caucasian female initially referred by primary care physician for management of mood symptoms  History of mood swings, hypersensitivity to meds. Diagnosed with bipolar, mood disorder and personality disorder Has been on multiple meds before but didn't do well. Now has joined DBT for impulsivity lamictal didn't help. 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 DJD Modifying factor: farm, animals Now doing therpay with [facility]     Past Psychiatric History: mood symptoms, depression.   Previous Psychotropic Medications: Yes   Substance Abuse History in the last 12 months:  No.  Consequences of Substance Abuse: NA  Past Medical History:  Past Medical History:  Diagnosis Date  . Anxiety   . Arthritis   . Back pain, chronic   . Bipolar 1 disorder ([place])   . Chronic fatigue syndrome   . DDD (degenerative disc disease), lumbar   . Depression   . Fibromyalgia   . IUD 2012   Mirena  . MTHFR gene mutation [facility])     Past Surgical History:  Procedure Laterality Date  .  COLONOSCOPY WITH PROPOFOL N/A 02/25/2017   Procedure: COLONOSCOPY WITH PROPOFOL;  Surgeon: Arta Silence, MD;  Location: [facility] ENDOSCOPY;  Service: Endoscopy;  Laterality: N/A;  . left knee lateral release  1993  . scar tisue removal  03/2005   left ankle  . TONSILLECTOMY  age 37's    Family Psychiatric History: uncle. depression  Family History:  Family History  Problem Relation Age of Onset  . Diabetes Mother   . Stroke Mother   . Lupus Mother   . Hypertension Mother   . Congestive Heart Failure Mother   . Mental illness Brother        Not clear what his diagnosis is--may be related to previous drug use.  . Cancer Maternal Grandmother        colon  . Depression Maternal Uncle   . Alcohol abuse Maternal Uncle   . Diabetes Maternal Grandfather     Social History:   Social History   Socioeconomic History  . Marital status: Single    Spouse name: None  . Number of children: 0  . Years of education: None  . Highest education level: None  Social Needs  . Financial resource strain: None  . Food insecurity - worry: None  . Food insecurity - inability: None  . Transportation needs - medical: None  . Transportation needs - non-medical: None  Occupational History  . Occupation: Designer, industrial/product at times  Tobacco Use  . Smoking status: Never Smoker  . Smokeless tobacco: Never Used  Substance and  Sexual Activity  . Alcohol use: No    Alcohol/week: 0.0 oz  . Drug use: No  . Sexual activity: Yes    Birth control/protection: IUD  Other Topics Concern  . None  Social History Narrative   Originally from [place], outside of [place].    Moved here permanently 2012.   Intermittently worked on a farm.   Lives with many cats--3 plus fosters cats.       Allergies:   Allergies  Allergen Reactions  . Metaxalone Hives    Metabolic Disorder Labs: Lab Results  Component Value Date   HGBA1C 5.6 03/04/2016   No results found for: PROLACTIN Lab Results  Component Value Date    CHOL 152 03/04/2016   TRIG 81 03/04/2016   HDL 37 (L) 03/04/2016   LDLCALC 99 03/04/2016   LDLCALC 71 12/20/2007     Current Medications: Current Outpatient Medications  Medication Sig Dispense Refill  . Carboxymethylcellul-Glycerin (LUBRICATING EYE DROPS OP) Apply 1 drop to eye 2 (two) times daily as needed (dry eyes).    . cetirizine (ZYRTEC) 10 MG tablet Take 1 tablet (10 mg total) by mouth daily. 30 tablet 5  . Glucosamine HCl (GLUCOSAMINE PO) Take 1 tablet by mouth 2 (two) times daily.    [name] HYDROcodone-acetaminophen (NORCO) 5-325 MG per tablet Take 1 tablet by mouth every 6 (six) hours as needed for moderate pain.     [name] MAGNESIUM GLYCINATE PLUS PO Take 240 mg by mouth.    . meloxicam (MOBIC) 15 MG tablet Take 15 mg by mouth daily.  0  . mometasone (NASONEX) 50 MCG/ACT nasal spray Place 2 sprays into the nose daily. 17 g 3  . montelukast (SINGULAIR) 10 MG tablet Take 1 tablet (10 mg total) by mouth at bedtime. 30 tablet 5  . Omega-3 Fatty Acids (FISH OIL PO) Take by mouth 2 (two) times daily.    . Oxycodone HCl 10 MG TABS Take 5 mg by mouth 2 (two) times daily as needed (pain).    . Polyethylene Glycol 3350 (MIRALAX PO) Take by mouth.    [name] PROVENTIL HFA 108 (90 Base) MCG/ACT inhaler INHALE 2 PUFFS BY MOUTH FOUR TIMES DAILY AS NEEDED FOR COUGHING, WHEEZING, OR SHORTNESS OF BREATH  0  . traMADol (ULTRAM) 50 MG tablet Take 25-100 mg by mouth 2 (two) times daily as needed for moderate pain.    [name] OXcarbazepine (TRILEPTAL) 150 MG tablet Take 1 tablet (150 mg total) 2 (two) times daily by mouth. 60 tablet 0  . thioridazine (MELLARIL) 10 MG tablet Take 1 tablet (10 mg total) at bedtime by mouth. 30 tablet 0   No current facility-administered medications for this visit.      Psychiatric Specialty Exam: Review of Systems  Cardiovascular: Negative for chest pain.  Musculoskeletal: Positive for myalgias.  Skin: Negative for rash.  Psychiatric/Behavioral: Positive for depression. The  patient has insomnia.     Blood pressure 110/68, pulse 86, resp. rate 16, height 5\' 7"  (1.702 m), weight 170 lb (77.1 kg), SpO2 96 %.Body mass index is 26.63 kg/m.  General Appearance: Casual  Eye Contact:  Minimal  Speech:  Normal Rate  Volume:  Normal  Mood:less irritable today  Affect:  Congruent  Thought Process:  Goal Directed and Descriptions of Associations: Intact  Orientation:  Full (Time, Place, and Person)  Thought Content:  Rumination  Suicidal Thoughts:  No  Homicidal Thoughts:  No  Memory:  Immediate;   Fair Recent;   Fair  Judgement:  Poor  Insight:  Shallow  Psychomotor Activity:  Normal  Concentration:  Concentration: Fair and Attention Span: Fair  Recall:  [employer] of Knowledge:Fair  Language: Fair  Akathisia:  No  Handed:  Right  AIMS (if indicated):    Assets:  Desire for Improvement  ADL's:  Intact  Cognition: WNL  Sleep:  variable    Treatment Plan Summary: Medication management and Plan as follows  1. Mood disorder unspecified: maybe related to physical health, arthritis, back condition. Will start mood stabilizer, reviewd gene testing 2. Bipolar disorder, mixed: ongoing some days are better. Denies suicidal thoughts. Will give mellaril 10mg  or trileptal. 150mg  She will search which is more affordable before dispensing Reviewed side effects and concern including gene testing Lenda Kelp is recently out and will observe if it was effecting medication or moods   Borderline personality;  Has awareness. Continue DBT FU 3-4 weeks or early. Continue therapy Call in for earlier concerns    Merian Capron, MD 11/15/201811:30 AM

## 2017-11-11 ENCOUNTER — Ambulatory Visit (INDEPENDENT_AMBULATORY_CARE_PROVIDER_SITE_OTHER): Payer: Self-pay | Admitting: Licensed Clinical Social Worker

## 2017-11-11 DIAGNOSIS — Z8659 Personal history of other mental and behavioral disorders: Secondary | ICD-10-CM

## 2017-11-11 DIAGNOSIS — F0630 Mood disorder due to known physiological condition, unspecified: Secondary | ICD-10-CM

## 2017-11-11 DIAGNOSIS — F3160 Bipolar disorder, current episode mixed, unspecified: Secondary | ICD-10-CM

## 2017-11-11 NOTE — Progress Notes (Signed)
THERAPIST PROGRESS NOTE  Session Time: 10:05 AM to 11 AM  Participation Level: Active  Behavioral Response: CasualAlertEuthymic  Type of Therapy: Individual Therapy  Treatment Goals addressed:  patient learn anger management skills, emotional regulation skills coping skills to help with management of emotions and decrease in symptoms, learn coping skills  Interventions: Solution Focused, Strength-based, Supportive, Reframing and Other: Emotional regulation skills  Summary: Frances [name] is a 41 y.o. female who presents with review of symptoms from past week including "stuck in cycle", that leads to her feeling suicidal, and that leads to her feeling down in the dumps. She then reflects, "plummets" then crawls inside herself and shuts down. Discussed difficulty of not responding to something when it triggers her even when she knows her triggers. Shared that last week escalating in [employer] class. Therapist explored further with her to find out more about the situation, patient related not following what was going on in class, she said something that was pointed out as rude, that it was not patient's intention and apologized afterward. Related had to take a walk at break. Patient also escalated yesterday in peer support group, had to leave, was crying. Shares that this is something she doesn't do in front of other people, but was able to open up and talk to one of her supports. Explains that when she lets people see her cry that means she is "letting them in". Therapist explored situation to uncover underlying reason for her reaction. Patient relates that in December she will not be as involved with the [facility], it'll be a hard time without them being there, there is also fear of losing peer support and acknowledges her reaction is feeling she might as well quit now. Therapist discussed with patient finding other supports as part of her support system such as [facility] night and says she will go back. Discussed symptoms of borderline personality disorder and how symptoms relate to how patient describes her experiences. Discuss learning strategies that will help with mood regulation that will require new habits. 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 Also difficult because family she usually spends Thanksgiving she won't be able to do this year. Patient also describes mood instability related to IUD, and problems with medications including how they mix with each other has negative impact.  Suicidal/Homicidal: No  Therapist Response: Assess patient current functioning per report. Explored with patient recent triggers and underlying causes for recent escalations in emotion to help build insight and to help with coping. Identified patient not being able to spend time at Thanksgiving with family that she normally does and also her feelings related to spending time with her own family. Discussed how dealing with difficult feelings that can lead to sadness may cause her to find other ways that are less painful to release emotions such as anger escalation. Discuss helpfulness of this therapeutic relationship to help patient work on building trust and developing relationship. Discuss some symptoms of borderline personality disorder that seemed to relate to what patient has reported experiencing including instability in relationships, moods, thinking, behavior and even identity. Identified extreme sensitivity, emotional volatility and inability to self-soothe leads to relationship turmoil and impulsive behavior. Discuss patient learning coping skills that can help her feel better and back in control of her  thoughts, feelings and actions. Discussed that it's a matter of developing new habits and the importance of  choosing to pause, reflect and then act in new ways to help better manage symptoms. Uncovered patient's humor as a ego strength for her and also mental health [facility] and support she has met there have been very helpful for her. Provided supportive and strength-based intervention. Therapist provided medication education and related that meds can help patient obtain some stability that will help her in working on coping skills  Plan: Return again in 1 week.2. Therapist work with patient on development of mood regulation strategies.3. Patient will review handout "borderline personality disorder" to help her begin to learn strategies to help with symptoms  Diagnosis: Axis I:  mood disorder in conditions classified elsewhere, bipolar disorder most recent episode mixed,history of borderline personality disorder    Axis II: No diagnosis    Cordella Register, LCSW 11/11/2017

## 2017-11-16 ENCOUNTER — Ambulatory Visit (INDEPENDENT_AMBULATORY_CARE_PROVIDER_SITE_OTHER): Payer: Self-pay | Admitting: Licensed Clinical Social Worker

## 2017-11-16 DIAGNOSIS — F0630 Mood disorder due to known physiological condition, unspecified: Secondary | ICD-10-CM

## 2017-11-16 DIAGNOSIS — Z8659 Personal history of other mental and behavioral disorders: Secondary | ICD-10-CM

## 2017-11-16 DIAGNOSIS — F3160 Bipolar disorder, current episode mixed, unspecified: Secondary | ICD-10-CM
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
## 2017-11-26 ENCOUNTER — Ambulatory Visit: Admission: RE | Admit: 2017-11-26 | Discharge: 2017-11-26 | Disposition: A | Discharge status: Home / Self Care

## 2017-11-26 ENCOUNTER — Ambulatory Visit
Admission: RE | Admit: 2017-11-26 | Discharge: 2017-11-26 | Disposition: A | Discharge status: Home / Self Care | Attending: Rheumatology | Admitting: Rheumatology

## 2017-11-26 DIAGNOSIS — M0609 Rheumatoid arthritis without rheumatoid factor, multiple sites: Principal | ICD-10-CM

## 2017-11-27 ENCOUNTER — Ambulatory Visit (INDEPENDENT_AMBULATORY_CARE_PROVIDER_SITE_OTHER): Payer: Self-pay | Admitting: Licensed Clinical Social Worker

## 2017-11-27 DIAGNOSIS — F0630 Mood disorder due to known physiological condition, unspecified: Secondary | ICD-10-CM

## 2017-11-27 DIAGNOSIS — Z8659 Personal history of other mental and behavioral disorders: Secondary | ICD-10-CM

## 2017-11-27 DIAGNOSIS — F3160 Bipolar disorder, current episode mixed, unspecified: Secondary | ICD-10-CM

## 2017-11-27 NOTE — Progress Notes (Signed)
THERAPIST PROGRESS NOTE  Session Time: 12:03 PM to 1:58 PM  Participation Level: Active  Behavioral Response: CasualAlertEuthymic periodically as well as some worry and anger related to stressors of relationships  Type of Therapy: Individual Therapy  Treatment Goals addressed:   patient learn anger management skills, emotional regulation skills coping skills to help with management of emotions and decrease in symptoms,   Interventions: CBT, Solution Focused, Strength-based and Other: Effective interpersonal skills, education and coping skills to manage and relationships with people in addiction  Summary: BLESSED GIRDNER is a 41 y.o. female who presents with manic for a period of time and reviewed with her journal to include when she left therapy appointment last Monday until Thursday. Identified pattern that she doesn't sleep and then gets manic he used to another episode of being hyper. Shares that she "plummeted Thursday". Patient able to connect mood shift with stress of dealing with friend who is in active addiction and finally having to set boundaries so that she does not enable her in her addiction. Patient relates that negative manic occurred till Saturday where she had feelings of depression, suicidal thoughts but was determined not to act on them. She reached out to her supports, colored but didn't feel she was affectively coping as mood did not change although she recognizes she was implementing coping skills. Therapist also pointed out the helpfulness in journal writing to help her gain insight to herself and underlying mood changes. Patient also describes another friend who verbalizes suicide ideation and concern that she will be okay although patient realizes all she can do is be a support for her. Therapist divided feedback as to difficulty resented by these relationships and having a significant emotional impact on her. Encouraged her with coping to take care of self as priority.  Stress from relationships also cause her to be exhausted. Described manic state that she still feels crappy, describes head being dragged along with body". Describes just being diagnosed with rheumatoid arthritis so she will be starting a new medication. Also describes taking Benadryl and at the same time on Mellaril and not sure at thinks medications contributed to "restless body syndrome" where her body and arms were jerking, experiencing shakes, shimmering and body wouldn't stop for an hour. Validated patient on her insight that side effects can come from drug-drug interaction or side effects of a medication .    Suicidal/Homicidal: No  Therapist Response: Assess patient current functioning per report. Reviewed patient's pattern of moods to better understand pattern and also to understand triggers for med changes. Reviewed recent trigger a friend who is in addiction, how that impacts patient's mood and reinforced patient's healthy way of coping by setting boundaries at the same time validating the difficulty of the situation for her. Provided positive feedback for patient journaling events so she is able to understand her mood swings and what is triggering her mood and this ultimately is a very helpful way to develop coping skills as well as being a coping skill and it self as patient has an outlet that will help her with her emotions by releasing emotions. Discuss other trigger of friend who has verbalized suicide ideation, help patient to process feelings about this stressor and provided supportive and strength-based interventions in terms of helping her with helpful coping to manage the stressful situations. Did some education about treatment for drug and alcohol, to help patient better understand dynamics and help her in this dysfunctional relationship. Identify possible manic type symptoms as she  describes manic dates that can last for a few days. Patient expressed concerned of not having contact with  support she wants over the holidays and therapist discussed mood of borderline personality disorder discussed of schema therapy of belief that what is happening now is all there is, was or ever will be and use cognitive interventions to challenge this belief.  Plan: Return again in 2 weeks.2.therapist continued to work with patient on emotional regulation skills and skills to manage stressors   Diagnosis: Axis I:  mood disorder in conditions classified elsewhere, bipolar disorder most recent episode mixed,history of borderline personality disorder    Axis II: No diagnosis    Cordella Register, LCSW 11/27/2017

## 2017-12-02 ENCOUNTER — Ambulatory Visit (INDEPENDENT_AMBULATORY_CARE_PROVIDER_SITE_OTHER): Payer: Self-pay | Admitting: Psychiatry

## 2017-12-02 ENCOUNTER — Other Ambulatory Visit: Payer: Self-pay

## 2017-12-02 ENCOUNTER — Encounter (HOSPITAL_COMMUNITY): Payer: Self-pay | Admitting: Psychiatry

## 2017-12-02 VITALS — BP 100/68 | HR 79 | Ht 67.0 in | Wt 173.0 lb

## 2017-12-02 DIAGNOSIS — Z8659 Personal history of other mental and behavioral disorders: Secondary | ICD-10-CM

## 2017-12-02 DIAGNOSIS — M549 Dorsalgia, unspecified: Secondary | ICD-10-CM

## 2017-12-02 DIAGNOSIS — M791 Myalgia, unspecified site: Secondary | ICD-10-CM

## 2017-12-02 DIAGNOSIS — R4587 Impulsiveness: Secondary | ICD-10-CM

## 2017-12-02 DIAGNOSIS — Z975 Presence of (intrauterine) contraceptive device: Secondary | ICD-10-CM

## 2017-12-02 DIAGNOSIS — F314 Bipolar disorder, current episode depressed, severe, without psychotic features: Secondary | ICD-10-CM

## 2017-12-02 DIAGNOSIS — Z811 Family history of alcohol abuse and dependence: Secondary | ICD-10-CM

## 2017-12-02 DIAGNOSIS — F603 Borderline personality disorder: Secondary | ICD-10-CM

## 2017-12-02 DIAGNOSIS — G4700 Insomnia, unspecified: Secondary | ICD-10-CM

## 2017-12-02 DIAGNOSIS — F0630 Mood disorder due to known physiological condition, unspecified: Secondary | ICD-10-CM

## 2017-12-02 DIAGNOSIS — Z818 Family history of other mental and behavioral disorders: Secondary | ICD-10-CM

## 2017-12-02 MED ORDER — THIORIDAZINE HCL 10 MG PO TABS: 10.0000 mg | ORAL_TABLET | Freq: Two times a day (BID) | ORAL | 1 refills | Status: DC

## 2017-12-02 NOTE — Progress Notes (Signed)
[facility] Outpatient Follow up visit   Patient Identification: Frances [name] MRN:  [id number] Date of Evaluation:  12/02/2017 Referral Source: [facility] primary care office Chief Complaint:   Chief Complaint    Follow-up; Other     Visit Diagnosis:    ICD-10-CM   1. Mood disorder in conditions classified elsewhere F06.30   2. History of borderline personality disorder Z86.59     History of Present Illness:  41 years old, single Caucasian female initially referred by primary care physician for management of mood symptoms  History of mood swings, hypersensitivity to meds. Diagnosed with bipolar, mood disorder and personality disorder Has been on multiple meds before but didn't do well. Now has joined DBT for impulsivity Last visit started mellaril or trileptal. She chose mellaril. Small dose 10mg . Didn't react bad . Has helped her sleep and mood somewhat. Attends support groups.  Didn't start trileptal Diagnosed with rA have to chose methotrexate or a newer med   She used CBT oil that made it worse Also take hydrocodone prn for pain and DJD Pain effects mood and sleep  Aggravating factor: bback condition, RA or psoriatic arthritis.DJD Modifying factor: farm, horse, animals Durations: many years Severity of depression 6/10     Past Psychiatric History: mood symptoms, depression.   Previous Psychotropic Medications: Yes   Substance Abuse History in the last 12 months:  No.  Consequences of Substance Abuse: NA  Past Medical History:  Past Medical History:  Diagnosis Date  . Anxiety   . Arthritis   . Back pain, chronic   . Bipolar 1 disorder ([facility])   . Chronic fatigue syndrome   . DDD (degenerative disc disease), lumbar   . Depression   . Fibromyalgia   . IUD 2012   Mirena  . MTHFR gene mutation [facility])     Past Surgical History:  Procedure Laterality Date  . COLONOSCOPY WITH PROPOFOL N/A 02/25/2017   Procedure: COLONOSCOPY WITH PROPOFOL;  Surgeon: Arta Silence, MD;   Location: [facility] ENDOSCOPY;  Service: Endoscopy;  Laterality: N/A;  . left knee lateral release  1993  . scar tisue removal  03/2005   left ankle  . TONSILLECTOMY  age 20's    Family Psychiatric History: uncle. depression  Family History:  Family History  Problem Relation Age of Onset  . Diabetes Mother   . Stroke Mother   . Lupus Mother   . Hypertension Mother   . Congestive Heart Failure Mother   . Mental illness Brother        Not clear what his diagnosis is--may be related to previous drug use.  . Cancer Maternal Grandmother        colon  . Depression Maternal Uncle   . Alcohol abuse Maternal Uncle   . Diabetes Maternal Grandfather     Social History:   Social History   Socioeconomic History  . Marital status: Single    Spouse name: None  . Number of children: 0  . Years of education: None  . Highest education level: None  Social Needs  . Financial resource strain: None  . Food insecurity - worry: None  . Food insecurity - inability: None  . Transportation needs - medical: None  . Transportation needs - non-medical: None  Occupational History  . Occupation: Designer, industrial/product at times  Tobacco Use  . Smoking status: Never Smoker  . Smokeless tobacco: Never Used  Substance and Sexual Activity  . Alcohol use: No    Alcohol/week: 0.0 oz  .  Drug use: No  . Sexual activity: Yes    Birth control/protection: IUD  Other Topics Concern  . None  Social History Narrative   Originally from [place], outside of [place].    Moved here permanently 2012.   Intermittently worked on a farm.   Lives with many cats--3 plus fosters cats.       Allergies:   Allergies  Allergen Reactions  . Metaxalone Hives    Metabolic Disorder Labs: Lab Results  Component Value Date   HGBA1C 5.6 03/04/2016   No results found for: PROLACTIN Lab Results  Component Value Date   CHOL 152 03/04/2016   TRIG 81 03/04/2016   HDL 37 (L) 03/04/2016   LDLCALC 99 03/04/2016   LDLCALC 71  12/20/2007     Current Medications: Current Outpatient Medications  Medication Sig Dispense Refill  . Carboxymethylcellul-Glycerin (LUBRICATING EYE DROPS OP) Apply 1 drop to eye 2 (two) times daily as needed (dry eyes).    . cetirizine (ZYRTEC) 10 MG tablet Take 1 tablet (10 mg total) by mouth daily. 30 tablet 5  . Glucosamine HCl (GLUCOSAMINE PO) Take 1 tablet by mouth 2 (two) times daily.    [name] HYDROcodone-acetaminophen (NORCO) 5-325 MG per tablet Take 1 tablet by mouth every 6 (six) hours as needed for moderate pain.     [name] MAGNESIUM GLYCINATE PLUS PO Take 240 mg by mouth.    . meloxicam (MOBIC) 15 MG tablet Take 15 mg by mouth daily.  0  . mometasone (NASONEX) 50 MCG/ACT nasal spray Place 2 sprays into the nose daily. 17 g 3  . montelukast (SINGULAIR) 10 MG tablet Take 1 tablet (10 mg total) by mouth at bedtime. 30 tablet 5  . Omega-3 Fatty Acids (FISH OIL PO) Take by mouth 2 (two) times daily.    . Oxycodone HCl 10 MG TABS Take 5 mg by mouth 2 (two) times daily as needed (pain).    . Polyethylene Glycol 3350 (MIRALAX PO) Take by mouth.    [name] PROVENTIL HFA 108 (90 Base) MCG/ACT inhaler INHALE 2 PUFFS BY MOUTH FOUR TIMES DAILY AS NEEDED FOR COUGHING, WHEEZING, OR SHORTNESS OF BREATH  0  . thioridazine (MELLARIL) 10 MG tablet Take 1 tablet (10 mg total) by mouth 2 (two) times daily. Can take both at night if sedated 60 tablet 1  . tizanidine (ZANAFLEX) 2 MG capsule Take 2 mg by mouth.    . traMADol (ULTRAM) 50 MG tablet Take 25-100 mg by mouth 2 (two) times daily as needed for moderate pain.    [name] OXcarbazepine (TRILEPTAL) 150 MG tablet Take 1 tablet (150 mg total) 2 (two) times daily by mouth. (Patient not taking: Reported on 12/02/2017) 60 tablet 0   No current facility-administered medications for this visit.      Psychiatric Specialty Exam: Review of Systems  Cardiovascular: Negative for chest pain.  Musculoskeletal: Positive for back pain and myalgias.  Skin: Negative for rash.   Psychiatric/Behavioral: The patient has insomnia.     Blood pressure 100/68, pulse 79, height 5\' 7"  (1.702 m), weight 173 lb (78.5 kg).Body mass index is 27.1 kg/m.  General Appearance: Casual  Eye Contact:  Minimal  Speech:  Normal Rate  Volume:  Normal  Mood: not agitated. Somewhat calmer  Affect:  Congruent cooperative  Thought Process:  Goal Directed and Descriptions of Associations: Intact  Orientation:  Full (Time, Place, and Person)  Thought Content:  Rumination  Suicidal Thoughts:  No  Homicidal Thoughts:  No  Memory:  Immediate;   Fair Recent;   Fair  Judgement:  Poor  Insight:  Shallow  Psychomotor Activity:  Normal  Concentration:  Concentration: Fair and Attention Span: Fair  Recall:  [employer] of Knowledge:Fair  Language: Fair  Akathisia:  No  Handed:  Right  AIMS (if indicated):    Assets:  Desire for Improvement  ADL's:  Intact  Cognition: WNL  Sleep:  variable    Treatment Plan Summary: Medication management and Plan as follows  1. Mood disorder unspecified: maybe related to physical health, arthritis, back condition.fluctuates. Mellaril helped some will increase to bid. Still small dose.  No side effects reported  2. Bipolar disorder, mixed: not worse. Had some hyper days but didn't do anything risky. She wants to continue mellaril. Still a small dose   Borderline personality;  Has awareness. At times get moody , continue CBT FU 4 weeks. She will be on RA med by that time    Merian Capron, MD 12/12/201810:46 AM

## 2017-12-07 MED ORDER — METHOTREXATE SODIUM 2.5 MG TABLET: tablet | 1 refills | 0 days | Status: AC

## 2017-12-07 MED ORDER — METHOTREXATE SODIUM 2.5 MG TABLET
ORAL_TABLET | 1 refills | 0.00000 days | Status: CP
Start: 2017-12-07 — End: 2018-02-08

## 2017-12-08 ENCOUNTER — Ambulatory Visit (INDEPENDENT_AMBULATORY_CARE_PROVIDER_SITE_OTHER): Payer: Self-pay | Admitting: Licensed Clinical Social Worker

## 2017-12-08 DIAGNOSIS — Z8659 Personal history of other mental and behavioral disorders: Secondary | ICD-10-CM

## 2017-12-08 DIAGNOSIS — F0630 Mood disorder due to known physiological condition, unspecified: Secondary | ICD-10-CM

## 2017-12-08 DIAGNOSIS — F3160 Bipolar disorder, current episode mixed, unspecified: Secondary | ICD-10-CM

## 2017-12-08 NOTE — Progress Notes (Signed)
THERAPIST PROGRESS NOTE  Session Time: 1:05 PM to 2 PM  Participation Level: Active  Behavioral Response: CasualAlertDysphoric and Irritable  Type of Therapy: Individual Therapy  Treatment Goals addressed:  develop and demonstrate coping skills to deal with mood swings, develop the ability to control impulsive behavior learn and practice interpersonal relationships skills, terminate self damaging behaviors  Interventions: CBT, Solution Focused, Strength-based, Supportive, Reframing and Other: As schema therapy approach, coping  Summary: Frances [name] is a 41 y.o. female who presents "feeling like crap today". Described yesterday anxiety high, has not been like that in a while and can't use skills when she is like that that she is learning to manage mood. This morning woke up depressed, was crying, now he feels normal and then expects to feel crappy again. Describes difficulty of managing rapid mood shifts. Therapist explored triggers underlying mood. Patient viewing how medications could be affecting mood, not sleeping at night as well because of [place], has been taking naps during the day. Describes feeling exhausted relates this to her physical issues, to winter and trying to get more sun. Reviewed triggers recently discussed in treatment include friend who is wrapped up in her addiction. Therapist reinforced patient effective strategy is setting boundaries and not believing anything she says. Patient shared her feeling doing bad because she is not being a good friend to her by helping her. Patient related feeling that "wants to quit everything, walls of life closing in on her. Therapist asked her to explain further and identified significant issue is not having accepted and deal with changes brought on by medical issues and needing to mourning previous self. She is going to class next year to help her with this through [facility]. Patient related wanting to cut off from  [facility] [employer] and not trusting therapist therapist pointed out pattern of rejecting prematurely and related this to maladaptive patterns developed over time related to a way to be self protective and therapist worked on challenging encouraging patient to change this pattern. Completed treatment plan. Therapist also encouraged patient to develop plan to help her cope over the holidays, patient able to identify group she will go to tonight, therapist encouraged her to stay connected to supports but this could not engage patient in full developmental of plan.    Suicidal/Homicidal: No  Therapist Response: Assess patient current functioning per report. Therapist continues to challenge patient's negative self talk and negative perception of current life experiences. Provided education about schema therapy by first explaining a schemas a deep seated, felt an internalized belief about the self in relation to others. At all people have schema test and focus in treatment is to help patient he'll maladaptive ones because they no longer serve the patient listened terms of healthy interpersonal relating. Identified patient walking away from relationships even ones that are positive for her. Discussed early maladaptive schemas as the memories, emotions, bodily sensations, and cognitions associated with the distractive aspect of the individual's childhood experience organize into patterns that repeat throughout life. Discussed detectors developed during developmental stages to growth in response to childhood stressors in patient's current interactive pattern is related to protecting self but maladaptive. Reviewed treatment goals that helped complaining out patient's dichotomous thinking and focus is for her to tolerate more ambiguity in complexity and situations. Reviewed current stressors and underlying sources and triggers for mood and discussed coping with stressors. Provided supportive and strength-based  interventions.  Plan: Return again in 3 weeks.2. Therapist continued to work with  patient on mood regulation strategies, CBT strategies to challenge distortions and thoughts, and implement schema-based therapy approach  Diagnosis: Axis I:   mood disorder in conditions classified elsewhere, bipolar disorder most recent episode mixed,history of borderline personality disorder    Axis II: No diagnosis    Cordella Register, LCSW 12/08/2017

## 2017-12-09 ENCOUNTER — Encounter: Payer: Self-pay | Admitting: Physician Assistant

## 2017-12-09 ENCOUNTER — Ambulatory Visit (INDEPENDENT_AMBULATORY_CARE_PROVIDER_SITE_OTHER): Payer: Self-pay | Admitting: Physician Assistant

## 2017-12-09 VITALS — BP 121/83 | HR 99 | Ht 67.0 in | Wt 174.0 lb

## 2017-12-09 DIAGNOSIS — M0600 Rheumatoid arthritis without rheumatoid factor, unspecified site: Secondary | ICD-10-CM | POA: Insufficient documentation

## 2017-12-09 DIAGNOSIS — M0609 Rheumatoid arthritis without rheumatoid factor, multiple sites: Secondary | ICD-10-CM

## 2017-12-09 DIAGNOSIS — F319 Bipolar disorder, unspecified: Secondary | ICD-10-CM

## 2017-12-09 MED ORDER — NAPROXEN 500 MG PO TABS: 500.0000 mg | ORAL_TABLET | Freq: Two times a day (BID) | ORAL | 0 refills | Status: DC

## 2017-12-09 NOTE — Progress Notes (Addendum)
Subjective:    Patient ID: Frances [name], female    DOB: [birth year], 41 y.o.   MRN: [id number]  HPI Patient is a 41 y/o female with PMH of new RA dx, chronic low back pain, bipolar 1 disorder and borderline personality disorder  Bipolar 1 disorder - Patient is being followed by Dr. De Nurse. She reports that her mood is plummeting she had a very good and "high" day and then her mood was very low yesterday. She visited friends yesterday which improved her mood significantly. She was recently switched to thioridazine twice daily and is scheduled to follow-up with Behavioral Health in 6 weeks.  RA - Patient reports recently being diagnosed with RA. She was prescribed methotrexate but has not started the therapy yet. Patient reports that she is doing PT which has helped some with her neck and back, however was told the methotrexate will only help her neck and not her back. She takes oxycodone occasionally for pain. Patient has been taking meloxicam and will switch between naproxen and meloxicam. She is asking for a refill in the naproxen. She would also like for me to order Vectra testing for her to better characterize her RA level.   .. Active Ambulatory Problems    Diagnosis Date Noted  . FEVER, RECURRENT 04/02/2009  . HYPOTHYROIDISM 02/10/2008  . INSOMNIA-SLEEP DISORDER-UNSPEC 02/10/2008  . DERMATITIS, ALLERGIC 06/05/2008  . 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 FIBROMYALGIA 02/10/2008  . FATIGUE 05/05/2008  . IRON, SERUM, ELEVATED 02/10/2008  . DYSPNEA 10/15/2011  . Bipolar I disorder ([place]) 09/16/2017  . Fibromyalgia 09/16/2017  . Chronic fatigue syndrome 09/16/2017  . MTHFR gene mutation ([place]) 09/20/2017  . TMJ (temporomandibular joint syndrome) 09/26/2017  . New daily persistent headache 09/26/2017  . Rheumatoid arthritis 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 ([place]) 12/09/2017   Resolved Ambulatory Problems    Diagnosis Date Noted  . SINUSITIS-  ACUTE-NOS 03/24/2008  . URI 01/17/2009  . ANKLE PAIN, BILATERAL 07/28/2008  . BACK PAIN 10/13/2008  . OPEN WOUND FT NO TOE ALONE WITHOUT MENTION COMP 03/28/2009   Past Medical History:  Diagnosis Date  . Anxiety   . Arthritis   . Back pain, chronic   . Bipolar 1 disorder ([place])   . Chronic fatigue syndrome   . DDD (degenerative disc disease), lumbar   . Depression   . Fibromyalgia   . IUD 2012  . MTHFR gene mutation [facility])      Review of Systems See HPI, all other systems reviewed are negative    Objective:   Physical Exam  Constitutional: She is oriented to person, place, and time. She appears well-developed and well-nourished.  HENT:  Head: Normocephalic and atraumatic.  Neurological: She is alert and oriented to person, place, and time.  Psychiatric: She has a normal mood and affect. Her behavior is normal. Thought content normal.  Vitals reviewed.     Assessment & Plan:  .Marland KitchenMarland KitchenDiagnoses and all orders for this visit:  Rheumatoid arthritis 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 (HCC) -     naproxen (NAPROSYN) 500 MG tablet; Take 1 tablet (500 mg total) by mouth 2 (two) times daily with a meal.  Bipolar I disorder (HCC)  RA - Patient is being followed by rheumatology for her RA, she will start methotrexate therapy. - Continue PT for back - Refilled naproxen per patient request. Advised patient to not take meloxicam and naproxen at the same time because they are both NSAIDs. - Will work  on getting Vectra testing set up and approved.  Bipolar 1 disorder - Patient is being followed by Dr. De Nurse in [facility] and was recently started on thioridazine. She will follow-up with him in 6 weeks. - Discussed [place] therapy with patient as alternative non-pharmacologic treatment option for her Bipolar 1. Encouraged patient to research this and discuss it further with Dr. De Nurse.

## 2017-12-18 ENCOUNTER — Other Ambulatory Visit: Payer: Self-pay | Admitting: Internal Medicine

## 2017-12-18 ENCOUNTER — Encounter: Payer: Self-pay | Admitting: Physician Assistant

## 2017-12-18 DIAGNOSIS — R9389 Abnormal findings on diagnostic imaging of other specified body structures: Secondary | ICD-10-CM

## 2017-12-18 DIAGNOSIS — M0600 Rheumatoid arthritis without rheumatoid factor, unspecified site: Secondary | ICD-10-CM

## 2017-12-23 ENCOUNTER — Ambulatory Visit (INDEPENDENT_AMBULATORY_CARE_PROVIDER_SITE_OTHER): Payer: Self-pay

## 2017-12-23 DIAGNOSIS — R918 Other nonspecific abnormal finding of lung field: Secondary | ICD-10-CM

## 2017-12-23 DIAGNOSIS — M0600 Rheumatoid arthritis without rheumatoid factor, unspecified site: Secondary | ICD-10-CM

## 2017-12-23 DIAGNOSIS — R9389 Abnormal findings on diagnostic imaging of other specified body structures: Secondary | ICD-10-CM

## 2017-12-25 NOTE — Telephone Encounter (Signed)
Frances [name], what is the status on test kit for Vectra testing?

## 2017-12-29 ENCOUNTER — Ambulatory Visit (INDEPENDENT_AMBULATORY_CARE_PROVIDER_SITE_OTHER): Payer: Self-pay | Admitting: Licensed Clinical Social Worker

## 2017-12-29 DIAGNOSIS — Z8659 Personal history of other mental and behavioral disorders: Secondary | ICD-10-CM

## 2017-12-29 DIAGNOSIS — F3160 Bipolar disorder, current episode mixed, unspecified: Secondary | ICD-10-CM

## 2017-12-29 DIAGNOSIS — F0630 Mood disorder due to known physiological condition, unspecified: Secondary | ICD-10-CM
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
## 2018-01-04 ENCOUNTER — Ambulatory Visit (INDEPENDENT_AMBULATORY_CARE_PROVIDER_SITE_OTHER): Payer: Self-pay | Admitting: Neurology

## 2018-01-04 ENCOUNTER — Encounter: Payer: Self-pay | Admitting: Neurology

## 2018-01-04 VITALS — BP 104/72 | HR 92 | Ht 67.0 in | Wt 169.8 lb

## 2018-01-04 DIAGNOSIS — G319 Degenerative disease of nervous system, unspecified: Secondary | ICD-10-CM

## 2018-01-04 NOTE — Progress Notes (Signed)
Called and spoke with Nicole Kindred in [place]. She is to call Pt and schedule at [facility]
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
## 2018-01-04 NOTE — Patient Instructions (Signed)
I think the findings on the MRI are incidental and probably not clinically relevant.  However, I would like to get MRI of the brain for further evaluation of the cerebellum to make sure there isn't anything else to address.

## 2018-01-05 NOTE — Telephone Encounter (Signed)
See MY chart msg on vectra kit that kim was working on.

## 2018-01-07 ENCOUNTER — Ambulatory Visit (INDEPENDENT_AMBULATORY_CARE_PROVIDER_SITE_OTHER): Payer: Self-pay | Admitting: Licensed Clinical Social Worker

## 2018-01-07 DIAGNOSIS — Z8659 Personal history of other mental and behavioral disorders: Secondary | ICD-10-CM

## 2018-01-07 DIAGNOSIS — F0630 Mood disorder due to known physiological condition, unspecified: Secondary | ICD-10-CM

## 2018-01-07 DIAGNOSIS — F3160 Bipolar disorder, current episode mixed, unspecified: Secondary | ICD-10-CM

## 2018-01-07 NOTE — Progress Notes (Addendum)
THERAPIST PROGRESS NOTE  Session Time: 11:02 AM to 11:57 AM  Participation Level: Active  Behavioral Response: CasualAlertIrritable and labile  Type of Therapy: Individual Therapy  Treatment Goals addressed:   develop and demonstrate coping skills to deal with mood swings, develop the ability to control impulsive behavior learn and practice interpersonal relationships skills, terminate self damaging behaviors  Interventions: CBT, Solution Focused, Strength-based, Supportive, Reframing and Other: Coping  Summary: Frances [name] is a 42 y.o. female who presents with doesn't think anything is on her side in life, doesn't trust life. Describes since last session symptoms worsening, also describes ongoing mood swings. Relates dreams to include murdering somebody and punching walls and therapist explored with patient and provided input that seemed to indicate a significant amount of anger. Explored underlying sources to impact mood patient describes [facility] calling her dramatic, ongoing trigger of friend who is an addict who brings patient in to the chaos when reaching out for help to patient. Discuss setting boundaries and stepping away is helpful for patient and patient does say she talks to her supports for help. Shares she is tired of feeling terrible, tired of not sleeping. Shares that she will get 4,5, 6 hours of sleep, muscle relaxants will put her to sleep sometimes and sometimes they won't. Patient shares that she doesn't live for herself, not here to be happy, but does care about other people. Shares that she doesn't know why she is here because there is nothing to feel better for. Therapist identified patient's cognitive distortion and also current mood as impacted perspective. Discussed focus on self as important part of patient's well-being. Other triggers for the patient include upcoming services security appointments, starting new medication for  rheumatoid arthritis. Relates tried of getting to the point of "hitting a brick wall with mood and body and then crash. Also has problems with dizziness. During session patient recognizes mood swings, lack of focus. Patient shared she is going to cry probably after session and hates it. Therapist encouraged challenging distorted thoughts and take steps to effective coping to stop mood spiraling downward.    Suicidal/Homicidal: No  Therapist Response: Assessed current functioning per report. Noted patient's ongoing mood swings that were present during session as well as lack of focus, racing thoughts as patient went from one topic to another. Explored underlying triggers related to patient plummeting last week to include worries about friend who has substance abuse, starting new medication for rheumatoid arthritis last week, being sensitive to combat made by somebody at [facility] that she was dramatic, her body getting out as a trigger an upcoming appointments for services security is stressful. Identified patient negative mood as fact into mood and affect for today's session. Patient continues to engage in cognitive distortions therapist worked on raising insight identifying them, challenging them and noting how significantly they impact mood, cognitive distortions to include all or nothing thinking, over generalizing, mental filtering, describing the positive, catastrophize saying, fortune telling. Discussed as well patient's current depression as distorting her perspective and using self talk to challenge this distortion. Discussed underlying anger from when she was here on that needs to continue to be focus of treatment to help her be at a better place so that she doesn't continue to repeat and current interpersonal relationships. Help patient to process feelings related to stressors, validated patient on her feelings, encourage helpful coping so that she does not continue to spiraled  downwards, worked on reframing provided supportive  and strength-based intervention  Plan: Return again in 2 weeks.2. Continue to explore patient's anger issues and help her to identify and challenge maladaptive schemas.2. Therapist work with patient on mood regulation skills, stress management and trauma focused interventions  Diagnosis: Axis I:   mood disorder in conditions classified elsewhere, bipolar disorder most recent episode mixed,history of borderline personality disorder    Axis II: No diagnosis    Cordella Register, LCSW 01/07/2018

## 2018-01-09 ENCOUNTER — Ambulatory Visit (HOSPITAL_BASED_OUTPATIENT_CLINIC_OR_DEPARTMENT_OTHER)
Admission: RE | Admit: 2018-01-09 | Discharge: 2018-01-09 | Disposition: A | Discharge status: Home / Self Care | Payer: Self-pay | Source: Ambulatory Visit | Attending: Neurology | Admitting: Neurology

## 2018-01-09 DIAGNOSIS — G319 Degenerative disease of nervous system, unspecified: Secondary | ICD-10-CM | POA: Insufficient documentation

## 2018-01-11 ENCOUNTER — Telehealth: Payer: Self-pay

## 2018-01-11 NOTE — Telephone Encounter (Signed)
LM advising Pt of MRI results and to call with any questions.

## 2018-01-11 NOTE — Telephone Encounter (Signed)
-----   Message from Pieter Partridge, DO sent at 01/11/2018  9:16 AM EST ----- MRI of brain, which looks at the cerebellum better than the MRI of the cervical spine, shows that the cerebellum looks normal.  There is no atrophy.

## 2018-01-12 ENCOUNTER — Other Ambulatory Visit: Payer: Self-pay | Admitting: Physician Assistant

## 2018-01-12 MED ORDER — MONTELUKAST SODIUM 10 MG PO TABS: 10.0000 mg | ORAL_TABLET | Freq: Every day | ORAL | 5 refills | Status: DC

## 2018-01-12 MED ORDER — MELOXICAM 15 MG PO TABS: 15.0000 mg | ORAL_TABLET | Freq: Every day | ORAL | 2 refills | Status: DC

## 2018-01-12 MED ORDER — MOMETASONE FUROATE 50 MCG/ACT NA SUSP: 2.0000 | Freq: Every day | NASAL | 3 refills | Status: DC

## 2018-01-12 MED ORDER — CETIRIZINE HCL 10 MG PO TABS: 10.0000 mg | ORAL_TABLET | Freq: Every day | ORAL | 5 refills | Status: DC

## 2018-01-12 NOTE — Telephone Encounter (Signed)
Can we please respond to her on vectra testing?

## 2018-01-13 ENCOUNTER — Encounter (HOSPITAL_COMMUNITY): Payer: Self-pay | Admitting: Psychiatry

## 2018-01-13 ENCOUNTER — Other Ambulatory Visit: Payer: Self-pay

## 2018-01-13 ENCOUNTER — Ambulatory Visit (INDEPENDENT_AMBULATORY_CARE_PROVIDER_SITE_OTHER): Payer: Self-pay | Admitting: Psychiatry

## 2018-01-13 ENCOUNTER — Telehealth: Payer: Self-pay

## 2018-01-13 VITALS — BP 126/78 | HR 78 | Ht 67.0 in | Wt 171.0 lb

## 2018-01-13 DIAGNOSIS — F0630 Mood disorder due to known physiological condition, unspecified: Secondary | ICD-10-CM

## 2018-01-13 DIAGNOSIS — Z811 Family history of alcohol abuse and dependence: Secondary | ICD-10-CM

## 2018-01-13 DIAGNOSIS — Z975 Presence of (intrauterine) contraceptive device: Secondary | ICD-10-CM

## 2018-01-13 DIAGNOSIS — Z818 Family history of other mental and behavioral disorders: Secondary | ICD-10-CM

## 2018-01-13 DIAGNOSIS — M0609 Rheumatoid arthritis without rheumatoid factor, multiple sites: Secondary | ICD-10-CM

## 2018-01-13 DIAGNOSIS — Z8659 Personal history of other mental and behavioral disorders: Secondary | ICD-10-CM

## 2018-01-13 DIAGNOSIS — M791 Myalgia, unspecified site: Secondary | ICD-10-CM

## 2018-01-13 DIAGNOSIS — F3160 Bipolar disorder, current episode mixed, unspecified: Secondary | ICD-10-CM

## 2018-01-13 NOTE — Telephone Encounter (Signed)
Frances [name], [place] [address] MON - FRI 8:00AM-5:00PM CLSD LUNCH 12:00PM-100P

## 2018-01-13 NOTE — Telephone Encounter (Signed)
Done

## 2018-01-13 NOTE — Progress Notes (Addendum)
[facility] Outpatient Follow up visit   Patient Identification: Frances [name] MRN:  [id number] Date of Evaluation:  01/13/2018 Referral Source: Luvenia Starch primary care office Chief Complaint:    Visit Diagnosis:    ICD-10-CM   1. Mood disorder in conditions classified elsewhere F06.30   2. History of borderline personality disorder Z86.59   3. Bipolar I disorder, most recent episode mixed ([place]) F31.60     History of Present Illness:  42 years old, single Caucasian female initially referred by primary care physician for management of mood symptoms  History of mood swings, hypersensitivity to meds. Diagnosed with bipolar, mood disorder and personality disorder Has been on multiple meds before.  Started with I am upset, mellaril was working but Armed forces training and education officer think its working. I need more therapy but you guys dont have therapy appointment more then a week  " you people are not helping me" altough we have gone thru mood stabilizers, list and most of them have not worked. We talked about possible borderline personality and have adjusted medication, she have done GEne testing prior as well and reviewed meds. chosed her choice of mood stabilizer mellaril last time .  When I ask should we increase mellaril since it has helped and was doing better last visit ... Her reply is I dont know  Got mad, irritable . She has cursed the front staff while checking out from therapy last visit.   Not satisfied with therapy appointment either. Have been Not satisfied with her providers. Now is showing being not satisfied here but she has been coming back and I asked about it.  She remains not to be open and continued to show agitation. Did not talk about stressor or factors to work on adjusting medications and remained agitated.   I recommend therapy and to adjust medications or to increase mellaril as I my part of treatment. Explained that pain and other medical conditions need to be dealt by other providers or speciality  if it is making her mood worse.... Didn't like that idea and rushed out " mood agitated but didn't feel like to listen or go thru meds changes either"  Didn't make a follow up appointment at front   Denies suicidal thoughts.   Diagnosed with rA and have been started with methotrexate. RA and pain has been effecting her mood as well   She used CBT oil that made it worse Also take hydrocodone prn for pain and DJD Did not explore any recent use of marijuana since her mood was apprehensive . I have talked about marijuana or related mood altering substances to contribute to mood symptoms, impaired judjement and make our meds non effective.  Pa Says took vicodin but feels meds dont work all together for mood and pain  Aggravating factor: back condition.  RA or psoriatic arthritis.DJD Modifying factor: farm,  animals Durations: many years Severity of depression variable.     Past Psychiatric History: mood symptoms, depression.   Previous Psychotropic Medications: Yes   Substance Abuse History in the last 12 months:  No.  Consequences of Substance Abuse: NA  Past Medical History:  Past Medical History:  Diagnosis Date  . Anxiety   . Arthritis   . Back pain, chronic   . Bipolar 1 disorder ([place])   . Chronic fatigue syndrome   . DDD (degenerative disc disease), lumbar   . Depression   . Fibromyalgia   . IUD 2012   Mirena  . MTHFR gene mutation ([place])  Past Surgical History:  Procedure Laterality Date  . COLONOSCOPY WITH PROPOFOL N/A 02/25/2017   Procedure: COLONOSCOPY WITH PROPOFOL;  Surgeon: Arta Silence, MD;  Location: [facility] ENDOSCOPY;  Service: Endoscopy;  Laterality: N/A;  . left knee lateral release  1993  . scar tisue removal  03/2005   left ankle  . TONSILLECTOMY  age 7's    Family Psychiatric History: uncle. depression  Family History:  Family History  Problem Relation Age of Onset  . Diabetes Mother   . Stroke Mother   . Lupus Mother   . Hypertension  Mother   . Congestive Heart Failure Mother   . Mental illness Brother        Not clear what his diagnosis is--may be related to previous drug use.  . Cancer Maternal Grandmother        colon  . Depression Maternal Uncle   . Alcohol abuse Maternal Uncle   . Diabetes Maternal Grandfather     Social History:   Social History   Socioeconomic History  . Marital status: Single    Spouse name: None  . Number of children: 0  . Years of education: None  . Highest education level: Bachelor's degree (e.g., BA, AB, BS)  Social Needs  . Financial resource strain: None  . Food insecurity - worry: None  . Food insecurity - inability: None  . Transportation needs - medical: None  . Transportation needs - non-medical: None  Occupational History  . Occupation: Designer, industrial/product at times  Tobacco Use  . Smoking status: Never Smoker  . Smokeless tobacco: Never Used  Substance and Sexual Activity  . Alcohol use: No    Alcohol/week: 0.0 oz  . Drug use: No  . Sexual activity: Yes    Birth control/protection: IUD  Other Topics Concern  . None  Social History Narrative   Originally from [place], outside of [place].    Moved here permanently 2012.   Intermittently worked on a farm.   Lives with many cats--3 plus fosters cats.    Single, lives alone in a one story home. Rarely drinks caffeine. 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      Allergies:   Allergies  Allergen Reactions  . Metaxalone Hives    Metabolic Disorder Labs: Lab Results  Component Value Date   HGBA1C 5.6 03/04/2016   No results found for: PROLACTIN Lab Results  Component Value Date   CHOL 152 03/04/2016   TRIG 81 03/04/2016   HDL 37 (L) 03/04/2016   LDLCALC 99 03/04/2016   LDLCALC 71 12/20/2007     Current Medications: Current Outpatient Medications  Medication Sig Dispense Refill  . Carboxymethylcellul-Glycerin (LUBRICATING EYE DROPS OP) Apply 1 drop to eye 2 (two) times daily  as needed (dry eyes).    . cetirizine (ZYRTEC) 10 MG tablet Take 1 tablet (10 mg total) by mouth daily. 30 tablet 5  . Glucosamine HCl (GLUCOSAMINE PO) Take 1 tablet by mouth 2 (two) times daily.    [name] HYDROcodone-acetaminophen (NORCO) 5-325 MG per tablet Take 1 tablet by mouth every 6 (six) hours as needed for moderate pain.     [name] MAGNESIUM GLYCINATE PLUS PO Take 240 mg by mouth.    . meloxicam (MOBIC) 15 MG tablet Take 1 tablet (15 mg total) by mouth daily. 90 tablet 2  . methotrexate (RHEUMATREX) 2.5 MG tablet Take 2.5 mg by mouth daily.  0  . mometasone (NASONEX) 50 MCG/ACT nasal spray Place 2 sprays into  the nose daily. 17 g 3  . montelukast (SINGULAIR) 10 MG tablet Take 1 tablet (10 mg total) by mouth at bedtime. 30 tablet 5  . naproxen (NAPROSYN) 500 MG tablet Take 1 tablet (500 mg total) by mouth 2 (two) times daily with a meal. 180 tablet 0  . Omega-3 Fatty Acids (FISH OIL PO) Take by mouth 2 (two) times daily.    . Oxycodone HCl 10 MG TABS Take 5 mg by mouth 2 (two) times daily as needed (pain).    . Polyethylene Glycol 3350 (MIRALAX PO) Take by mouth.    [name] PROVENTIL HFA 108 (90 Base) MCG/ACT inhaler INHALE 2 PUFFS BY MOUTH FOUR TIMES DAILY AS NEEDED FOR COUGHING, WHEEZING, OR SHORTNESS OF BREATH  0  . thioridazine (MELLARIL) 10 MG tablet Take 1 tablet (10 mg total) by mouth 2 (two) times daily. Can take both at night if sedated 60 tablet 1  . tizanidine (ZANAFLEX) 2 MG capsule Take 2 mg by mouth.    . traMADol (ULTRAM) 50 MG tablet Take 25-100 mg by mouth 2 (two) times daily as needed for moderate pain.     No current facility-administered medications for this visit.      Psychiatric Specialty Exam: Review of Systems  Cardiovascular: Negative for chest pain.  Musculoskeletal: Positive for myalgias.  Skin: Negative for rash.    Blood pressure 126/78, pulse 78, height 5\' 7"  (1.702 m), weight 171 lb (77.6 kg).Body mass index is 26.78 kg/m.  General Appearance: Casual  Eye  Contact:  Minimal  Speech:  Normal Rate  Volume:  Got loud  Mood: agiated  Affect:  congruent  Thought Process:  Goal Directed and Descriptions of Associations: Intact  Orientation:  Full (Time, Place, and Person)  Thought Content:  Rumination  Suicidal Thoughts:  No  Homicidal Thoughts:  No  Memory:  Immediate;   Fair Recent;   Fair  Judgement:  poor  Insight:  Shallow  Psychomotor Activity:  Normal  Concentration:  Concentration: Fair and Attention Span: Fair  Recall:  [employer] of Knowledge:Fair  Language: Fair  Akathisia:  No  Handed:  Right  AIMS (if indicated):    Assets:  Desire for Improvement  ADL's:  Intact  Cognition: WNL  Sleep:  variable    Treatment Plan Summary: Medication management and Plan as follows  1. Mood disorder unspecified: maybe related to physical health, arthritis, back condition.fluctuates. Mellaril helped but now she wasn't feeling it is helping as much. When I talked about increasing dose or talk about other meds. She didn't like the idea and rushed out. Says she is not getting enough therapy appointment either.  Didn't make appointment up front.   2. Bipolar disorder, mixed: didn't wait or cooperate enough to go thru meds and rushed out. Has not showed interest in working thru appointment or adjustment.     Borderline personality; more possibility causing mood swings.  Has some  awareness. Easily agitated. Can continue therapy. Has awareness of suicidal help lines and working in therapy to deal with impulsivity  Did not make follow up appointment or shown interest to comply and cooperate to adjust meds.  Can look for another provider Has been rude and cursed staff last check out from the office while scheduling.  Have talked about this patient in last staff meeting  for possible administrative discharge due to above as well .     Merian Capron, MD 1/23/201911:00 AM

## 2018-01-13 NOTE — Telephone Encounter (Signed)
Ok to communicate this to patient. Ok to order for patient.

## 2018-01-13 NOTE — Telephone Encounter (Signed)
Patient advised. Order in basket.

## 2018-01-13 NOTE — Telephone Encounter (Signed)
[name] can have Vectra DA test drawn at a [facility] draw site.    [place], [place] [address] MON - FRI 8:00AM-5:00PM CLSD LUNCH 12:00PM-100P

## 2018-01-14 ENCOUNTER — Ambulatory Visit (HOSPITAL_COMMUNITY): Payer: Self-pay | Admitting: Licensed Clinical Social Worker

## 2018-01-15 ENCOUNTER — Telehealth (HOSPITAL_COMMUNITY): Payer: Self-pay | Admitting: Licensed Clinical Social Worker

## 2018-01-15 NOTE — Telephone Encounter (Signed)
F[place] Pho Called. She is [name]'s DBT counselor. She wanted to report to [facility] that patient has hit her head on the door and put her fist through a few walls. This has happened multiple times the past few days.   No need to call Ally back, but if you need to speak with her you can call [phone]

## 2018-01-18 ENCOUNTER — Encounter: Payer: Self-pay | Admitting: Physician Assistant

## 2018-01-18 ENCOUNTER — Ambulatory Visit (INDEPENDENT_AMBULATORY_CARE_PROVIDER_SITE_OTHER): Payer: Self-pay | Admitting: Physician Assistant

## 2018-01-18 VITALS — BP 101/60 | HR 78 | Temp 98.3°F | Resp 16 | Wt 168.0 lb

## 2018-01-18 DIAGNOSIS — F603 Borderline personality disorder: Secondary | ICD-10-CM

## 2018-01-18 DIAGNOSIS — Z1589 Genetic susceptibility to other disease: Secondary | ICD-10-CM

## 2018-01-18 DIAGNOSIS — F419 Anxiety disorder, unspecified: Secondary | ICD-10-CM

## 2018-01-18 DIAGNOSIS — M0609 Rheumatoid arthritis without rheumatoid factor, multiple sites: Secondary | ICD-10-CM

## 2018-01-18 DIAGNOSIS — F319 Bipolar disorder, unspecified: Secondary | ICD-10-CM

## 2018-01-18 DIAGNOSIS — F519 Sleep disorder not due to a substance or known physiological condition, unspecified: Secondary | ICD-10-CM

## 2018-01-18 DIAGNOSIS — E7212 Methylenetetrahydrofolate reductase deficiency: Secondary | ICD-10-CM

## 2018-01-18 MED ORDER — TRAZODONE HCL 50 MG PO TABS: 50.0000 mg | ORAL_TABLET | Freq: Every day | ORAL | 1 refills | Status: DC

## 2018-01-18 NOTE — Patient Instructions (Addendum)
TMS vs ECT  Dr. Lurline Hare, Dr. Cheryln Manly, for counseling and psychiatry.   Outpatient intensive therapy.   Decrease mellaril to 10mg  at dinnertime. Trazodone 1/2 tablet at bedtime.

## 2018-01-18 NOTE — Progress Notes (Signed)
Subjective:    Patient ID: Frances [name], female    DOB: Apr 10, 1976, 42 y.o.   MRN: [id number]  HPI  Pt is a 42 yo female with bipolar, anxiety, RA, chronic pain and very sensitive to medications.   RA was recently diagnoses and they have started methotrexate and folic acid. She does not like potential side effects and does not know if she will stay on medication.   She also wants to be referred to another psychatrist/psychologist combination. She was unable with her [facility] experience downstairs. She feels like "he did not listen". She was not able to tolerate medications he wanted to try. Last medication was melliril. At first she felt like it helped some but now her mood is "worst than ever". She has "really bad days where she just wants to hurt herself". She reported multiple times beating her head into the wall. Denies any SI/HC today. She is really tired of not sleeping. She would like to try something.    .. Active Ambulatory Problems    Diagnosis Date Noted  . FEVER, RECURRENT 04/02/2009  . HYPOTHYROIDISM 02/10/2008  . 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 DERMATITIS, ALLERGIC 06/05/2008  . 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 FIBROMYALGIA 02/10/2008  . FATIGUE 05/05/2008  . IRON, SERUM, ELEVATED 02/10/2008  . DYSPNEA 10/15/2011  . Bipolar I disorder ([place]) 09/16/2017  . Fibromyalgia 09/16/2017  . Chronic fatigue syndrome 09/16/2017  . MTHFR gene mutation ([place]) 09/20/2017  . TMJ (temporomandibular joint syndrome) 09/26/2017  . New daily persistent headache 09/26/2017  . Rheumatoid arthritis 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 ([place]) 12/09/2017  . Anxiety 01/22/2018  . Borderline personality disorder ([place]) 01/22/2018   Resolved Ambulatory Problems    Diagnosis Date Noted  . SINUSITIS- ACUTE-NOS 03/24/2008  . URI 01/17/2009  . ANKLE PAIN, BILATERAL 07/28/2008  . BACK PAIN 10/13/2008  . OPEN WOUND FT NO TOE ALONE WITHOUT MENTION COMP  03/28/2009   Past Medical History:  Diagnosis Date  . Anxiety   . Arthritis   . Back pain, chronic   . Bipolar 1 disorder ([place])   . Chronic fatigue syndrome   . DDD (degenerative disc disease), lumbar   . Depression   . Fibromyalgia   . IUD 2012  . MTHFR gene mutation [facility])       Review of Systems    see HPI.  Objective:   Physical Exam  Constitutional: She is oriented to person, place, and time. She appears well-developed and well-nourished.  HENT:  Head: Normocephalic and atraumatic.  Cardiovascular: Normal rate, regular rhythm and normal heart sounds.  Pulmonary/Chest: Effort normal and breath sounds normal.  Neurological: She is alert and oriented to person, place, and time.  Psychiatric: She has a normal mood and affect. Her behavior is normal.          Assessment & Plan:  Marland KitchenMarland KitchenStefani was seen today for medication problem.  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 -     Ambulatory referral to Psychiatry -     Ambulatory referral to Psychology  Bipolar I disorder [facility]) -     Ambulatory referral to Psychiatry -     Ambulatory referral to Psychology  MTHFR gene mutation [facility]) -     Ambulatory referral to Psychiatry -  Ambulatory referral to Psychology  Anxiety -     Ambulatory referral to Psychiatry -     Ambulatory referral to Psychology  Borderline personality disorder [facility] -     Ambulatory referral to Psychiatry -     Ambulatory referral to Psychology   Made new referrals to counseling and [place]. I think she would be a great candidate for ECT vs [place]. I believe she would benefit from counseling once a week.  For now stay on mellaril cut back to 10mg  at bedtime.  Add trazodone 1/2 tablet at bedtime.  Mention outpatient intensive therapy but she states "I  can not get up that early"  Follow up with Rheumatology regarding metrotrexate and concerns. VECTRA test ordered via [facility].   .Marland KitchenSpent 30 minutes with patient and greater than 50 percent of visit spent counseling patient regarding treatment plan.

## 2018-01-20 ENCOUNTER — Telehealth: Payer: Self-pay | Admitting: Physician Assistant

## 2018-01-20 NOTE — Telephone Encounter (Signed)
Has this been faxed or do I need to make an non-amb rx?

## 2018-01-20 NOTE — Telephone Encounter (Signed)
Charlene [phone]) from [facility] called stating they drew the blood already but need the correct lab code entered so they can process the Vectra test. Lab code: [phone]. Lab name: [facility].  Fax order to: [phone].

## 2018-01-21 ENCOUNTER — Ambulatory Visit (INDEPENDENT_AMBULATORY_CARE_PROVIDER_SITE_OTHER): Payer: Self-pay | Admitting: Licensed Clinical Social Worker

## 2018-01-21 ENCOUNTER — Telehealth (HOSPITAL_COMMUNITY): Payer: Self-pay | Admitting: Licensed Clinical Social Worker

## 2018-01-21 DIAGNOSIS — Z8659 Personal history of other mental and behavioral disorders: Secondary | ICD-10-CM

## 2018-01-21 DIAGNOSIS — F0630 Mood disorder due to known physiological condition, unspecified: Secondary | ICD-10-CM

## 2018-01-21 DIAGNOSIS — F3160 Bipolar disorder, current episode mixed, unspecified: Secondary | ICD-10-CM

## 2018-01-21 NOTE — Progress Notes (Addendum)
   THERAPIST PROGRESS NOTE  Session Time: 11:03 AM to 11:45 AM  Participation Level: Active  Behavioral Response: CasualAlertDysphoric, Irritable and tearful  Type of Therapy: Individual Therapy  Treatment Goals addressed:   develop and demonstrate coping skills to deal with mood swings, develop the ability to control impulsive behavior learn and practice interpersonal relationships skills, terminate self damaging behaviors  Interventions: Solution Focused and Supportive,   Summary: Frances [name] is a 42 y.o. female who presents with moods up and down, all over the place, can't stand that her mood is all over the place, the more that she talks about it the worse it gets. Relates that the doctor doesn't help, thinks it is problems with meds but he is not willing to listen. Therapist discussed with patient reflecting on ways her attitude and behavior are interfering with getting help, reflect on way she is seeking help isn't helpful and stands in the way of benefiting fully from therapeutic process. Reviewed input from DBT groups that patient was hitting walls and punched a hole in wall, reviewed punching walls after her last psychiatric appointment and recommendation from PCP for intensive outpatient, as well asTMS/ECT. Therapist concurred that this was her recommendation and would refer her to [facility] or [facility] based on both programs are accessible based on limited funding. Patient did not want to be referred to these agencies based on previous history with them and also related that could not do an IOP in the morning because of negative mood as well as taking care of animals and problems with back. Discussed other recommendation by primary care doctor of going to [facility] for psychiatrist and psychologist. Discussed that patient could benefit from psychologist with doctorate could have an approach that would be helpful for patient. Patient expressed concerns with trust with this therapist as well  as questioning whether capable of managing her symptoms. Patient related that she was considering going to the hospital, not currently suicidal and also has concerns about watching her animals.  Suicidal/Homicidal: No  Therapist Response: Assessed patient current functioning per report. Reviewed with patient that she is having symptoms that indicate needing a higher level of care and that therapist was recommending higher level of care. Discussed that patient was not benefiting from outpatient and needed more intensive treatment Shared as well that primary care is also recommending higher level of care. Patient did not accept therapist's recommendations of referring to [facility] or [place]. Discussed PCP's recommendation of referring to [place] and benefits of seeing psychologist for therapy in addition to psychiatry as well as patient's concerns with this therapist. Therapist to make referral to [facility] for psychiatric follow up and therapy.    Plan: 1.Discharge patient and refer to [place] for psychiatric treatment and therapy with psychologist.   Diagnosis: Axis I:  mood disorder in conditions classified elsewhere, bipolar disorder most recent episode mixed, history of borderline personality disorder      Axis II: No diagnosis    Cordella Register, LCSW 01/21/2018

## 2018-01-21 NOTE — Telephone Encounter (Signed)
Therapist referred patient to IOP based on therapist's assessment that patient needed a higher level of care and reviewed presentation with case manager Dellia Nims and also Dr. Lovena Le. Patient was not accepted into program based on symptom presentation, including behaviors interfering with her being able to benefit from IOP program. Their recommendation was to refer to [facility] or [facility] based on patient's resources and also being able to provide help and resources for her at these agencies.

## 2018-01-21 NOTE — Telephone Encounter (Signed)
The last lab was entered as a Other/Misc lab test, they cannot accept this. Our system will not allow [place] to order any other way. Left VM for [facility] to fax [place] a blank order form so we can handwrite the order.

## 2018-01-21 NOTE — Telephone Encounter (Signed)
Ok thank you! I know this has been difficult.

## 2018-01-22 ENCOUNTER — Other Ambulatory Visit: Payer: Self-pay | Admitting: Physician Assistant

## 2018-01-22 ENCOUNTER — Telehealth (HOSPITAL_COMMUNITY): Payer: Self-pay | Admitting: Licensed Clinical Social Worker

## 2018-01-22 ENCOUNTER — Encounter: Payer: Self-pay | Admitting: Physician Assistant

## 2018-01-22 DIAGNOSIS — F603 Borderline personality disorder: Secondary | ICD-10-CM | POA: Insufficient documentation

## 2018-01-22 DIAGNOSIS — Z0000 Encounter for general adult medical examination without abnormal findings: Secondary | ICD-10-CM

## 2018-01-22 DIAGNOSIS — F419 Anxiety disorder, unspecified: Secondary | ICD-10-CM | POA: Insufficient documentation

## 2018-01-22 NOTE — Telephone Encounter (Signed)
Therapist reviewed recommendation with supervisor   who agreed with recommendation to discharge patient and refer to [facility] for treatment with psychiatrist and psychologist(also a recommended plan of PCP)

## 2018-01-22 NOTE — Telephone Encounter (Signed)
Received lab order form. Completed and faxed back.

## 2018-01-24 ENCOUNTER — Encounter (HOSPITAL_COMMUNITY): Payer: Self-pay | Admitting: *Deleted

## 2018-01-24 ENCOUNTER — Other Ambulatory Visit: Payer: Self-pay

## 2018-01-24 ENCOUNTER — Emergency Department (HOSPITAL_COMMUNITY)
Admission: EM | Admit: 2018-01-24 | Discharge: 2018-01-25 | Disposition: A | Discharge status: Other Acute Inpatient Hospital | Payer: Self-pay | Attending: Emergency Medicine | Admitting: Emergency Medicine

## 2018-01-24 DIAGNOSIS — F32A Depression, unspecified: Secondary | ICD-10-CM | POA: Diagnosis present

## 2018-01-24 DIAGNOSIS — Z79899 Other long term (current) drug therapy: Secondary | ICD-10-CM | POA: Insufficient documentation

## 2018-01-24 DIAGNOSIS — F329 Major depressive disorder, single episode, unspecified: Secondary | ICD-10-CM | POA: Insufficient documentation

## 2018-01-24 DIAGNOSIS — R0789 Other chest pain: Secondary | ICD-10-CM | POA: Insufficient documentation

## 2018-01-24 DIAGNOSIS — R45851 Suicidal ideations: Secondary | ICD-10-CM | POA: Insufficient documentation

## 2018-01-24 DIAGNOSIS — F3130 Bipolar disorder, current episode depressed, mild or moderate severity, unspecified: Secondary | ICD-10-CM | POA: Insufficient documentation

## 2018-01-24 LAB — COMPREHENSIVE METABOLIC PANEL
ALT: 20 U/L (ref 14–54)
AST: 25 U/L (ref 15–41)
Albumin: 4.2 g/dL (ref 3.5–5.0)
Alkaline Phosphatase: 53 U/L (ref 38–126)
Anion gap: 7 (ref 5–15)
BUN: 15 mg/dL (ref 6–20)
CO2: 25 mmol/L (ref 22–32)
Calcium: 8.9 mg/dL (ref 8.9–10.3)
Chloride: 109 mmol/L (ref 101–111)
Creatinine, Ser: 0.95 mg/dL (ref 0.44–1.00)
GFR calc Af Amer: >60 mL/min (ref >60)
GFR calc non Af Amer: >60 mL/min (ref >60)
Glucose, Bld: 169 mg/dL — ABNORMAL HIGH (ref 65–99)
Potassium: 3.4 mmol/L — ABNORMAL LOW (ref 3.5–5.1)
Sodium: 141 mmol/L (ref 135–145)
Total Bilirubin: 0.7 mg/dL (ref 0.3–1.2)
Total Protein: 6.6 g/dL (ref 6.5–8.1)

## 2018-01-24 LAB — CBC
HCT: 38.4 % (ref 36.0–46.0)
Hemoglobin: 13.5 g/dL (ref 12.0–15.0)
MCH: 32 pg (ref 26.0–34.0)
MCHC: 35.2 g/dL (ref 30.0–36.0)
MCV: 91 fL (ref 78.0–100.0)
Platelets: 196 10*3/uL (ref 150–400)
RBC: 4.22 MIL/uL (ref 3.87–5.11)
RDW: 13.8 % (ref 11.5–15.5)
WBC: 7.3 10*3/uL (ref 4.0–10.5)

## 2018-01-24 LAB — ETHANOL: Alcohol, Ethyl (B): <10 mg/dL (ref <10)

## 2018-01-24 LAB — RAPID URINE DRUG SCREEN, HOSP PERFORMED
Amphetamines: NOT DETECTED
Barbiturates: NOT DETECTED
Benzodiazepines: NOT DETECTED
Cocaine: NOT DETECTED
Opiates: NOT DETECTED
Tetrahydrocannabinol: NOT DETECTED

## 2018-01-24 LAB — SALICYLATE LEVEL: Salicylate Lvl: <7 mg/dL (ref 2.8–30.0)

## 2018-01-24 LAB — ACETAMINOPHEN LEVEL: Acetaminophen (Tylenol), Serum: <10 ug/mL — ABNORMAL LOW (ref 10–30)

## 2018-01-24 LAB — TROPONIN I: Troponin I: <0.03 ng/mL (ref <0.03)

## 2018-01-24 LAB — HCG, SERUM, QUALITATIVE: Preg, Serum: NEGATIVE

## 2018-01-24 MED ORDER — MONTELUKAST SODIUM 10 MG PO TABS
10.0000 mg | ORAL_TABLET | Freq: Every day | ORAL | Status: DC
  Administered 2018-01-25: 10 mg via ORAL
  Filled 2018-01-24: qty 1

## 2018-01-24 MED ORDER — ACETAMINOPHEN 325 MG PO TABS
650.0000 mg | ORAL_TABLET | ORAL | Status: DC | PRN
  Administered 2018-01-25 (×2): 650 mg via ORAL
  Filled 2018-01-24 (×2): qty 2

## 2018-01-24 MED ORDER — THIORIDAZINE HCL 10 MG PO TABS
10.0000 mg | ORAL_TABLET | Freq: Two times a day (BID) | ORAL | Status: DC
  Administered 2018-01-25 (×2): 10 mg via ORAL
  Filled 2018-01-24 (×2): qty 1

## 2018-01-24 MED ORDER — MELOXICAM 15 MG PO TABS
15.0000 mg | ORAL_TABLET | Freq: Every day | ORAL | Status: DC
  Administered 2018-01-25: 15 mg via ORAL
  Filled 2018-01-24: qty 1

## 2018-01-24 NOTE — BH Assessment (Signed)
[place] Assessment Progress Note  Case discussed with Lindon Romp, NP who recommends inpt treatment. EDP Desma Mcgregor and pt's nurse Sharyn Lull, RN have been advised of the recommendation.  Lind Covert, MSW, LCSW Therapeutic Triage Specialist  [phone]

## 2018-01-24 NOTE — ED Triage Notes (Signed)
Pt has had feeling of harming self and mood swing the last couple of weeks, states she has been hitting walls and banging head against walls. Also c/o chest pain x 3 days. Thinks it is a combination of medication and anxiety. "Sorta have a plan to harm self now." No H/I

## 2018-01-24 NOTE — ED Provider Notes (Signed)
[facility] DEPT Provider Note   CSN: [id number] Arrival date & time: 01/24/18  1702     History   Chief Complaint Chief Complaint  Patient presents with  . Suicidal  . Chest Pain    HPI Frances [name] is a 42 y.o. female history of anxiety, bipolar 1, depression who presents for evaluation of suicidal ideations.  Patient reports that over the last several weeks, she has noticed changes in her mood.  She reports that she will occasionally go through mood fluctuations but reports in the last several weeks, she has noticed that she is seems more down than normal.  Additionally, patient states that she started engaging in self-harm behavior.  Patient states that she started punching walls and hitting her head against walls.  No LOC noted.  Patient reports that there is no preceding incident that triggered these feelings.  Patient states that over the last few days, she started to have suicidal ideations.  Patient states that she started thinking "I do not want to be here anymore."  She states that she "knew she had a lot of medication and thought about taking all those pills to end it."  Patient reports that she had been seeing an outpatient therapist and her last appointment was 3 days ago.  She reports that at the most recent appointment, they informed her that they would be dropping her as a client.  Patient also reports 3-day history of chest pain.  She describes this as a chest tightness sensation.  She states it is not worse with deep inspiration or exertion.  She has no associated nausea/vomiting, diaphoresis or shortness of breath.  Patient states that she noticed the pain would get worse when she would be very stressed.  Patient is not a current smoker.  She denies any alcohol use.  Today fevers, numbness/weakness of her arms or legs, vision changes, abdominal pain, vomiting.  Patient denies any HI.  She denies any auditory/visual hallucinations.  The  history is provided by the patient.    Past Medical History:  Diagnosis Date  . Anxiety   . Arthritis   . Back pain, chronic   . Bipolar 1 disorder ([place])   . Chronic fatigue syndrome   . DDD (degenerative disc disease), lumbar   . Depression   . Fibromyalgia   . IUD 2012   Mirena  . MTHFR gene mutation [facility])     Patient Active Problem List   Diagnosis Date Noted  . Anxiety 01/22/2018  . Borderline personality disorder ([place]) 01/22/2018  . Rheumatoid arthritis 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 ([place]) 12/09/2017  . TMJ (temporomandibular joint syndrome) 09/26/2017  . New daily persistent headache 09/26/2017  . MTHFR gene mutation ([place]) 09/20/2017  . Bipolar I disorder ([place]) 09/16/2017  . Fibromyalgia 09/16/2017  . Chronic fatigue syndrome 09/16/2017  . DYSPNEA 10/15/2011  . FEVER, RECURRENT 04/02/2009  . DERMATITIS, ALLERGIC 06/05/2008  . FATIGUE 05/05/2008  . HYPOTHYROIDISM 02/10/2008  . 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 FIBROMYALGIA 02/10/2008  . IRON, SERUM, ELEVATED 02/10/2008    Past Surgical History:  Procedure Laterality Date  . COLONOSCOPY WITH PROPOFOL N/A 02/25/2017   Procedure: COLONOSCOPY WITH PROPOFOL;  Surgeon: Arta Silence, MD;  Location: [facility] ENDOSCOPY;  Service: Endoscopy;  Laterality: N/A;  . left knee lateral release  1993  . scar tisue removal  03/2005   left ankle  . TONSILLECTOMY  age 72's  OB History    Gravida Para Term Preterm AB Living   0 0 0 0 0 0   SAB TAB Ectopic Multiple Live Births   0 0 0 0         Home Medications    Prior to Admission medications   Medication Sig Start Date End Date Taking? Authorizing Provider  Carboxymethylcellul-Glycerin (LUBRICATING EYE DROPS OP) Apply 1 drop to eye 2 (two) times daily as needed (dry eyes).   Yes [provider]  cetirizine (ZYRTEC) 10 MG tablet Take 1 tablet (10 mg total) by mouth daily. 01/12/18  Yes  Breeback, Jade L, PA-C  Folinic Acid-Vit B6-Vit B12 4-50-2 MG TABS Take 1 tablet by mouth every other day.   Yes [provider]  Glucosamine HCl (GLUCOSAMINE PO) Take 1 tablet by mouth daily.    Yes [provider]  HYDROcodone-acetaminophen (NORCO) 5-325 MG per tablet Take 1 tablet by mouth every 6 (six) hours as needed for moderate pain.    Yes [provider]  l-methylfolate-B6-B12 (METANX) 3-35-2 MG TABS tablet Take 1 tablet by mouth every other day.    Yes [provider]  MAGNESIUM GLYCINATE PLUS PO Take 1 tablet by mouth 2 (two) times daily.    Yes [provider]  meloxicam (MOBIC) 15 MG tablet Take 1 tablet (15 mg total) by mouth daily. 01/12/18  Yes Breeback, Jade L, PA-C  methotrexate (RHEUMATREX) 2.5 MG tablet Take 10 mg by mouth every Thursday.  12/08/17  Yes [provider]  mometasone (NASONEX) 50 MCG/ACT nasal spray Place 2 sprays into the nose daily. Patient taking differently: Place 1 spray into the nose 2 (two) times daily.  01/12/18  Yes Breeback, Jade L, PA-C  montelukast (SINGULAIR) 10 MG tablet Take 1 tablet (10 mg total) by mouth at bedtime. 01/12/18  Yes Breeback, Jade L, PA-C  naproxen (NAPROSYN) 500 MG tablet Take 1 tablet (500 mg total) by mouth 2 (two) times daily with a meal. 12/09/17  Yes Breeback, Jade L, PA-C  Omega-3 Fatty Acids (FISH OIL PO) Take 1 capsule by mouth daily.    Yes [provider]  Oxycodone HCl 10 MG TABS Take 5 mg by mouth 2 (two) times daily as needed (pain).   Yes [provider]  PROVENTIL HFA 108 (90 Base) MCG/ACT inhaler INHALE 2 PUFFS BY MOUTH FOUR TIMES DAILY AS NEEDED FOR COUGHING, WHEEZING, OR SHORTNESS OF BREATH 08/20/17  Yes [provider]  thioridazine (MELLARIL) 10 MG tablet Take 1 tablet (10 mg total) by mouth 2 (two) times daily. Can take both at night if sedated Patient taking differently: Take 10 mg by mouth every evening.  12/02/17  Yes Merian Capron, MD   tizanidine (ZANAFLEX) 2 MG capsule Take 2 mg by mouth 3 (three) times daily as needed for muscle spasms (sleep).    Yes [provider]  traMADol (ULTRAM) 50 MG tablet Take 25-100 mg by mouth 2 (two) times daily as needed for moderate pain.   Yes [provider]  traZODone (DESYREL) 50 MG tablet Take 1 tablet (50 mg total) by mouth at bedtime. 01/18/18   Donella Stade, PA-C    Family History Family History  Problem Relation Age of Onset  . Diabetes Mother   . Stroke Mother   . Lupus Mother   . Hypertension Mother   . Congestive Heart Failure Mother   . Mental illness Brother        Not clear what his diagnosis  is--may be related to previous drug use.  . Cancer Maternal Grandmother        colon  . Depression Maternal Uncle   . Alcohol abuse Maternal Uncle   . Diabetes Maternal Grandfather     Social History Social History   Tobacco Use  . Smoking status: Never Smoker  . Smokeless tobacco: Never Used  Substance Use Topics  . Alcohol use: No    Alcohol/week: 0.0 oz  . Drug use: No     Allergies   Metaxalone   Review of Systems Review of Systems  Constitutional: Negative for chills and fever.  HENT: Negative for congestion.   Eyes: Negative for visual disturbance.  Respiratory: Negative for cough and shortness of breath.   Cardiovascular: Positive for chest pain.  Gastrointestinal: Negative for abdominal pain, diarrhea, nausea and vomiting.  Genitourinary: Negative for dysuria and hematuria.  Musculoskeletal: Negative for back pain and neck pain.  Skin: Negative for rash.  Neurological: Negative for dizziness, weakness, numbness and headaches.  Psychiatric/Behavioral: Positive for self-injury and suicidal ideas. Negative for confusion.     Physical Exam Updated Vital Signs BP 131/85 (BP Location: Right Arm)   Pulse 75   Temp 98.2 F (36.8 C) (Oral)   Resp 18   Ht 5\' 7"  (1.702 m)   Wt 76.2 kg (168 lb)   SpO2 100%   BMI 26.31 kg/m    Physical Exam  Constitutional: She is oriented to person, place, and time. She appears well-developed and well-nourished.  HENT:  Head: Normocephalic and atraumatic.  Mouth/Throat: Oropharynx is clear and moist and mucous membranes are normal.  No tenderness to palpation of skull. No deformities or crepitus noted. No open wounds, abrasions or lacerations.   Eyes: Conjunctivae, EOM and lids are normal. Pupils are equal, round, and reactive to light.  Neck: Full passive range of motion without pain.  Cardiovascular: Normal rate, regular rhythm, normal heart sounds and normal pulses. Exam reveals no gallop and no friction rub.  No murmur heard. Pulmonary/Chest: Effort normal and breath sounds normal.  Abdominal: Soft. Normal appearance. There is no tenderness. There is no rigidity and no guarding.  Musculoskeletal: Normal range of motion.  Neurological: She is alert and oriented to person, place, and time.  Cranial nerves III-XII intact Follows commands, Moves all extremities  5/5 strength to BUE and BLE  Sensation intact throughout all major nerve distributions No pronator drift. No gait abnormalities  No slurred speech. No facial droop.   Skin: Skin is warm and dry. Capillary refill takes less than 2 seconds.  Psychiatric: She has a normal mood and affect. Her speech is normal. She expresses suicidal ideation. She expresses no homicidal ideation. She expresses suicidal plans. She expresses no homicidal plans.  Nursing note and vitals reviewed.    ED Treatments / Results  Labs (all labs ordered are listed, but only abnormal results are displayed) Labs Reviewed  COMPREHENSIVE METABOLIC PANEL - Abnormal; Notable for the following components:      Result Value   Potassium 3.4 (*)    Glucose, Bld 169 (*)    All other components within normal limits  ACETAMINOPHEN LEVEL - Abnormal; Notable for the following components:   Acetaminophen (Tylenol), Serum <10 (*)    All other  components within normal limits  ETHANOL  SALICYLATE LEVEL  CBC  RAPID URINE DRUG SCREEN, HOSP PERFORMED  TROPONIN I  HCG, SERUM, QUALITATIVE    EKG  EKG Interpretation  Date/Time:  Sunday January 24 2018 17:29:25  EST Ventricular Rate:  78 PR Interval:    QRS Duration: 80 QT Interval:  369 QTC Calculation: 421 R Axis:   89 Text Interpretation:  Sinus rhythm No old tracing to compare Confirmed by Daleen Bo [phone]) on 01/24/2018 5:33:58 PM       Radiology No results found.  Procedures Procedures (including critical care time)  Medications Ordered in ED Medications  acetaminophen (TYLENOL) tablet 650 mg (not administered)  meloxicam (MOBIC) tablet 15 mg (not administered)  montelukast (SINGULAIR) tablet 10 mg (not administered)  thioridazine (MELLARIL) tablet 10 mg (not administered)     Initial Impression / Assessment and Plan / ED Course  I have reviewed the triage vital signs and the nursing notes.  Pertinent labs & imaging results that were available during my care of the patient were reviewed by me and considered in my medical decision making (see chart for details).     42 year old female past medical history of bipolar, depression who presents for evaluation of suicidal ideation.  Also reports that she has had 3 days of chest tightness.  Patient reports that she has been engaging in self-harm over the last several weeks that include punching walls and hitting her head against the wall.  Additionally, patient thought about taking all of her pills in order to kill herself.  Patient states that she has been compliant with her medications. Patient is afebrile, non-toxic appearing, sitting comfortably on examination table. Vital signs reviewed and stable.  During my examination, chest pain sounds more related to anxiety and distress.  Do not suspect cardiac etiology of chest pain, given complaints, will plan EKG and troponin.  Patient exhibits no neuro deficits, she  is on any blood thinners.  Do not feel that patient needs CT imaging for evaluation of hitting her head against the walls.  We will plan for medical clearance labs and TTS consultation.  Discussed with behavioral health.  They recommend inpatient admission.  Patient will board in the ED until there is a bed available.  Labs reviewed.  Acetaminophen level within normal.  CMP unremarkable.  Troponin negative.  I suspect that patient's chest pain may be due to panic and stress rather than cardiac etiology.Given that patient has chest pain for the last 3 days, 1 troponin is sufficient.  Urine drug screen is negative. Patient is medically clear.  Updated patient on plan.  She is agreeable to stay.   Final Clinical Impressions(s) / ED Diagnoses   Final diagnoses:  Suicidal ideation    ED Discharge Orders    None       Volanda Napoleon, PA-C 01/24/18 2346    Daleen Bo, MD 01/25/18 [phone]

## 2018-01-24 NOTE — BH Assessment (Addendum)
Assessment Note  Frances [name] is an 42 y.o. female who presents to the ED voluntarily. Pt reports she has been increasingly suicidal and thinking about taking all of her medications at once. Pt states she has constant thoughts of "I just don't want to be alive anymore." Pt states she was recently dropped from her OPT providers including her therapist and her psychiatrist because they told her they could no longer help her. Pt states she has been self-harming by banging her head on the wall and punching herself repeatedly. Pt states prior to last week, she had not engaged in any self-harm since September 2018.   Pt states she has been punching holes in walls, feeling hopeless, constant mood swings, feelings of helplessness, poor mood, lack of desire or motivation, insomnia, and fluctuating appetite. Pt states she has lost consciousness in the past due to slamming her head into walls. Pt is unable to contract for safety at this time and is willing to sign voluntary consent for treatment.   Pt reports a family hx of mental illness including Schizophrenia, Bipolar disorder, and depression.   Case discussed with Lindon Romp, NP who recommends inpt treatment. EDP Desma Mcgregor and pt's nurse Sharyn Lull, RN have been advised of the recommendation.  Diagnosis: Bipolar I Disorder  Past Medical History:  Past Medical History:  Diagnosis Date  . Anxiety   . Arthritis   . Back pain, chronic   . Bipolar 1 disorder ([place])   . Chronic fatigue syndrome   . DDD (degenerative disc disease), lumbar   . Depression   . Fibromyalgia   . IUD 2012   Mirena  . MTHFR gene mutation [facility])     Past Surgical History:  Procedure Laterality Date  . COLONOSCOPY WITH PROPOFOL N/A 02/25/2017   Procedure: COLONOSCOPY WITH PROPOFOL;  Surgeon: Arta Silence, MD;  Location: [facility] END[facility];  Service: Endoscopy;  Laterality: N/A;  . left knee lateral release  1993  . scar tisue removal  03/2005   left ankle  .  TONSILLECTOMY  age 54's    Family History:  Family History  Problem Relation Age of Onset  . Diabetes Mother   . Stroke Mother   . Lupus Mother   . Hypertension Mother   . Congestive Heart Failure Mother   . Mental illness Brother        Not clear what his diagnosis is--may be related to previous drug use.  . Cancer Maternal Grandmother        colon  . Depression Maternal Uncle   . Alcohol abuse Maternal Uncle   . Diabetes Maternal Grandfather     Social History:  reports that  has never smoked. she has never used smokeless tobacco. She reports that she does not drink alcohol or use drugs.  Additional Social History:  Alcohol / Drug Use Pain Medications: See MAR Prescriptions: See MAR Over the Counter: See MAR History of alcohol / drug use?: No history of alcohol / drug abuse  CIWA: CIWA-Ar BP: 131/85 Pulse Rate: 75 COWS:    Allergies:  Allergies  Allergen Reactions  . Metaxalone Hives    Home Medications:  (Not in a hospital admission)  OB/GYN Status:  No LMP recorded. Patient is not currently having periods (Reason: IUD).  General Assessment Data [facility] of [facility]: [facility] TTS Assessment: In system Is this a Tele or Face-to-Face Assessment?: Face-to-Face Is this an Initial Assessment or a Re-assessment for this encounter?: Initial Assessment Marital status: Single Is  patient pregnant?: No Pregnancy Status: No Living Arrangements: Alone Can pt return to current living arrangement?: Yes Admission Status: Voluntary Is patient capable of signing voluntary admission?: Yes Referral Source: Self/Family/Friend Insurance type: none     Crisis Care Plan Living Arrangements: Alone Name of Psychiatrist: none Name of Therapist: none  Education Status Is patient currently in school?: No Highest grade of school patient has completed: Bachelor's  Risk to self with the past 6 months Suicidal Ideation: Yes-Currently Present Has patient been a risk to self  within the past 6 months prior to admission? : Yes Suicidal Intent: Yes-Currently Present Has patient had any suicidal intent within the past 6 months prior to admission? : Yes Is patient at risk for suicide?: Yes Suicidal Plan?: Yes-Currently Present Has patient had any suicidal plan within the past 6 months prior to admission? : Yes Specify Current Suicidal Plan: pt states she thought of OD on her medication Access to Means: Yes Specify Access to Suicidal Means: pt has access to medication  What has been your use of drugs/alcohol within the last 12 months?: denies use Previous Attempts/Gestures: No Triggers for Past Attempts: None known Intentional Self Injurious Behavior: Damaging, Bruising Comment - Self Injurious Behavior: pt intentionally punches herself and bangs her head on the wall  Family Suicide History: No Recent stressful life event(s): Conflict (Comment), Loss (Comment)(pt's therapist and psychiatrist stopped treatment) Persecutory voices/beliefs?: No Depression: Yes Depression Symptoms: Despondent, Insomnia, Tearfulness, Isolating, Fatigue, Guilt, Loss of interest in usual pleasures, Feeling angry/irritable, Feeling worthless/self pity Substance abuse history and/or treatment for substance abuse?: No Suicide prevention information given to non-admitted patients: Not applicable  Risk to Others within the past 6 months Homicidal Ideation: No Does patient have any lifetime risk of violence toward others beyond the six months prior to admission? : No Thoughts of Harm to Others: No Current Homicidal Intent: No Current Homicidal Plan: No Access to Homicidal Means: No History of harm to others?: No Assessment of Violence: None Noted Does patient have access to weapons?: No Criminal Charges Pending?: No Does patient have a court date: No Is patient on probation?: No  Psychosis Hallucinations: None noted Delusions: None noted  Mental Status Report Appearance/Hygiene: In  scrubs, Unremarkable Eye Contact: Good Motor Activity: Freedom of movement Speech: Logical/coherent Level of Consciousness: Alert Mood: Depressed, Anxious, Helpless, Sad, Worthless, low self-esteem Affect: Anxious, Depressed, Sad Anxiety Level: Moderate Thought Processes: Relevant, Coherent Judgement: Impaired Orientation: Person, Place, Time, Situation, Appropriate for developmental age Obsessive Compulsive Thoughts/Behaviors: None  Cognitive Functioning Concentration: Normal Memory: Remote Intact, Recent Intact IQ: Average Insight: Poor Impulse Control: Poor Appetite: Fair Sleep: Decreased Total Hours of Sleep: 4 Vegetative Symptoms: Staying in bed  ADLScreening [facility] Assessment Services) Patient's cognitive ability adequate to safely complete daily activities?: Yes Patient able to express need for assistance with ADLs?: Yes Independently performs ADLs?: Yes (appropriate for developmental age)  Prior Inpatient Therapy Prior Inpatient Therapy: No  Prior Outpatient Therapy Prior Outpatient Therapy: Yes Prior Therapy Dates: 2019 Prior Therapy Facilty/Provider(s): [place] Reason for Treatment: Mood D/O, BIPOLAR Does patient have an ACCT team?: No Does patient have Intensive In-House Services?  : No Does patient have [facility] services? : No Does patient have P4CC services?: No  ADL Screening (condition at time of admission) Patient's cognitive ability adequate to safely complete daily activities?: Yes Is the patient deaf or have difficulty hearing?: No Does the patient have difficulty seeing, even when wearing glasses/contacts?: No Does the patient have difficulty  concentrating, remembering, or making decisions?: No Patient able to express need for assistance with ADLs?: Yes Does the patient have difficulty dressing or bathing?: No Independently performs ADLs?: Yes (appropriate for developmental age) Does the patient have  difficulty walking or climbing stairs?: Yes(pt states she has back pain ) Weakness of Legs: None Weakness of Arms/Hands: None  Home Assistive Devices/Equipment Home Assistive Devices/Equipment: None    Abuse/Neglect Assessment (Assessment to be complete while patient is alone) Abuse/Neglect Assessment Can Be Completed: Yes Physical Abuse: Denies Verbal Abuse: Denies Sexual Abuse: Denies Exploitation of patient/patient's resources: Denies Self-Neglect: Denies     Regulatory affairs officer (For Healthcare) Does Patient Have a Medical Advance Directive?: No Would patient like information on creating a medical advance directive?: No - Patient declined    Additional Information 1:1 In Past 12 Months?: No CIRT Risk: No Elopement Risk: No Does patient have medical clearance?: Yes     Disposition: Case discussed with Lindon Romp, NP who recommends inpt treatment. EDP Desma Mcgregor and pt's nurse Sharyn Lull, RN have been advised of the recommendation.   Disposition Initial Assessment Completed for this Encounter: Yes Disposition of Patient: Inpatient treatment program Type of inpatient treatment program: Adult(per Lindon Romp, NP)  On Site Evaluation by:   Reviewed with Physician:    Lyanne Co 01/24/2018 9:26 PM

## 2018-01-24 NOTE — ED Notes (Signed)
Patient arrived to unit and is cooperative with care. Pt's cell phone as well as belongings placed in locker #27. Unit rules explained to patient. Pt compliant. Pt verbally contracts for safety at this time. No signs of distress noted at this time. Visitor in room at this time and appears to be supportive of patient.

## 2018-01-25 ENCOUNTER — Inpatient Hospital Stay (HOSPITAL_COMMUNITY)
Admission: AD | Admit: 2018-01-25 | Discharge: 2018-02-01 | DRG: 885 | Disposition: A | Discharge status: Home / Self Care | Payer: Federal, State, Local not specified - Other | Source: Intra-hospital | Attending: Psychiatry | Admitting: Psychiatry

## 2018-01-25 ENCOUNTER — Encounter (HOSPITAL_COMMUNITY): Payer: Self-pay | Admitting: *Deleted

## 2018-01-25 ENCOUNTER — Other Ambulatory Visit: Payer: Self-pay

## 2018-01-25 DIAGNOSIS — R51 Headache: Secondary | ICD-10-CM

## 2018-01-25 DIAGNOSIS — M62838 Other muscle spasm: Secondary | ICD-10-CM | POA: Diagnosis present

## 2018-01-25 DIAGNOSIS — G4700 Insomnia, unspecified: Secondary | ICD-10-CM | POA: Diagnosis present

## 2018-01-25 DIAGNOSIS — F419 Anxiety disorder, unspecified: Secondary | ICD-10-CM | POA: Diagnosis present

## 2018-01-25 DIAGNOSIS — R110 Nausea: Secondary | ICD-10-CM | POA: Diagnosis not present

## 2018-01-25 DIAGNOSIS — F329 Major depressive disorder, single episode, unspecified: Secondary | ICD-10-CM

## 2018-01-25 DIAGNOSIS — Z8249 Family history of ischemic heart disease and other diseases of the circulatory system: Secondary | ICD-10-CM | POA: Diagnosis not present

## 2018-01-25 DIAGNOSIS — F603 Borderline personality disorder: Secondary | ICD-10-CM | POA: Diagnosis present

## 2018-01-25 DIAGNOSIS — Z560 Unemployment, unspecified: Secondary | ICD-10-CM | POA: Diagnosis not present

## 2018-01-25 DIAGNOSIS — M2550 Pain in unspecified joint: Secondary | ICD-10-CM

## 2018-01-25 DIAGNOSIS — M797 Fibromyalgia: Secondary | ICD-10-CM | POA: Diagnosis present

## 2018-01-25 DIAGNOSIS — Z811 Family history of alcohol abuse and dependence: Secondary | ICD-10-CM | POA: Diagnosis not present

## 2018-01-25 DIAGNOSIS — Z888 Allergy status to other drugs, medicaments and biological substances status: Secondary | ICD-10-CM | POA: Diagnosis not present

## 2018-01-25 DIAGNOSIS — M0689 Other specified rheumatoid arthritis, multiple sites: Secondary | ICD-10-CM | POA: Diagnosis present

## 2018-01-25 DIAGNOSIS — R45851 Suicidal ideations: Secondary | ICD-10-CM | POA: Diagnosis present

## 2018-01-25 DIAGNOSIS — Z79891 Long term (current) use of opiate analgesic: Secondary | ICD-10-CM

## 2018-01-25 DIAGNOSIS — F314 Bipolar disorder, current episode depressed, severe, without psychotic features: Secondary | ICD-10-CM | POA: Diagnosis present

## 2018-01-25 DIAGNOSIS — Z818 Family history of other mental and behavioral disorders: Secondary | ICD-10-CM

## 2018-01-25 DIAGNOSIS — E039 Hypothyroidism, unspecified: Secondary | ICD-10-CM | POA: Diagnosis present

## 2018-01-25 DIAGNOSIS — R450 Nervousness: Secondary | ICD-10-CM

## 2018-01-25 DIAGNOSIS — F32A Depression, unspecified: Secondary | ICD-10-CM | POA: Diagnosis present

## 2018-01-25 DIAGNOSIS — Z975 Presence of (intrauterine) contraceptive device: Secondary | ICD-10-CM

## 2018-01-25 DIAGNOSIS — R5382 Chronic fatigue, unspecified: Secondary | ICD-10-CM | POA: Diagnosis present

## 2018-01-25 DIAGNOSIS — Z79899 Other long term (current) drug therapy: Secondary | ICD-10-CM

## 2018-01-25 DIAGNOSIS — Z6379 Other stressful life events affecting family and household: Secondary | ICD-10-CM

## 2018-01-25 MED ORDER — MAGNESIUM HYDROXIDE 400 MG/5ML PO SUSP: 30.0000 mL | Freq: Every day | ORAL | Status: DC | PRN

## 2018-01-25 MED ORDER — ZOLPIDEM TARTRATE 5 MG PO TABS
5.0000 mg | ORAL_TABLET | Freq: Every evening | ORAL | Status: DC | PRN
  Administered 2018-01-25: 5 mg via ORAL
  Filled 2018-01-25: qty 1

## 2018-01-25 MED ORDER — ACETAMINOPHEN 325 MG PO TABS: 650.0000 mg | ORAL_TABLET | Freq: Four times a day (QID) | ORAL | Status: DC | PRN

## 2018-01-25 MED ORDER — ACETAMINOPHEN 325 MG PO TABS
650.0000 mg | ORAL_TABLET | ORAL | Status: DC | PRN
  Administered 2018-01-25 – 2018-02-01 (×8): 650 mg via ORAL
  Filled 2018-01-25 (×6): qty 2
  Filled 2018-01-25: qty 4
  Filled 2018-01-25 (×2): qty 2

## 2018-01-25 MED ORDER — ALUM & MAG HYDROXIDE-SIMETH 200-200-20 MG/5ML PO SUSP: 30.0000 mL | ORAL | Status: DC | PRN

## 2018-01-25 MED ORDER — THIORIDAZINE HCL 10 MG PO TABS
10.0000 mg | ORAL_TABLET | Freq: Two times a day (BID) | ORAL | Status: DC
  Administered 2018-01-25: 10 mg via ORAL
  Filled 2018-01-25 (×10): qty 1

## 2018-01-25 MED ORDER — HYDROXYZINE HCL 25 MG PO TABS
25.0000 mg | ORAL_TABLET | Freq: Three times a day (TID) | ORAL | Status: DC | PRN
  Administered 2018-01-26 – 2018-01-31 (×14): 25 mg via ORAL
  Filled 2018-01-25 (×12): qty 1
  Filled 2018-01-25: qty 6

## 2018-01-25 MED ORDER — MONTELUKAST SODIUM 10 MG PO TABS
10.0000 mg | ORAL_TABLET | Freq: Every day | ORAL | Status: DC
  Administered 2018-01-25 – 2018-01-31 (×7): 10 mg via ORAL
  Filled 2018-01-25 (×9): qty 1

## 2018-01-25 MED ORDER — MELOXICAM 7.5 MG PO TABS
15.0000 mg | ORAL_TABLET | Freq: Every day | ORAL | Status: DC
  Administered 2018-01-26 – 2018-02-01 (×7): 15 mg via ORAL
  Filled 2018-01-25: qty 2
  Filled 2018-01-25 (×3): qty 1
  Filled 2018-01-25: qty 2
  Filled 2018-01-25 (×2): qty 1
  Filled 2018-01-25 (×2): qty 2

## 2018-01-25 NOTE — ED Notes (Signed)
Pt transported to [facility] by [employer]. All belongings returned to pt who signed for same.Pt was calm and cooperative, but a little irritable because the lunch trays came late.

## 2018-01-25 NOTE — BH Assessment (Addendum)
[facility] Assessment Progress Note  Per Buford Dresser, DO, this pt requires psychiatric hospitalization at this time.  Leonia Reader, RN, [facility] reports that a bed will be available for this pt at [facility] later today; she will call when the bed is vacated.  She advises this Probation officer to have pt sign consent forms.  Pt has signed Voluntary Admission and Consent for Treatment, as well as Consent to Release Information to her PCP, and to her most recent outpatient behavioral health providers, all at the [facility], and signed forms have been faxed to [facility].  Pt's nurse, Diane, has been notified, and agrees to send original paperwork along with pt via Betsy Pries, and to call report to [phone] when the time comes.  Jalene Mullet, [place] [facility] Coordinator [phone]   Addendum:  Dimple Nanas calls back from [facility].  Pt is assigned to [id number]; [facility] will be ready to receive pt at 14:30.  Diane has been notified.  Jalene Mullet, [place] Coordinator [phone]

## 2018-01-25 NOTE — BHH Group Notes (Signed)
[place] Group Notes:  (Nursing/MHT/Case Management/Adjunct)  Date:  01/25/2018  Time:  1615  Type of Therapy:  Nurse Education - Positive Affirmations and Self Talk  Participation Level:  Minimal  Participation Quality:  Drowsy  Affect:  Blunted and Depressed  Cognitive:  Oriented  Insight:  Limited  Engagement in Group:  Limited  Modes of Intervention:  Education and Support  Summary of Progress/Problems: Patient attended group however stared off. Stated, "I haven't slept. I'm really tired. What time is it?"  Jamie Kato 01/25/2018, 5:36 PM

## 2018-01-25 NOTE — ED Notes (Signed)
Bed: [facility] Expected date:  Expected time:  Means of arrival:  Comments: Hold for room 27

## 2018-01-25 NOTE — Progress Notes (Signed)
Frances [name] is a 42 year old female pt admitted on voluntary basis. On admission, she reports that she has been having problems with her mood and reports she needs help with this. She reports that she recently lost her psychiatrist and therapist and reports that none of the medications she was taken were helping her. She does report that she has PCP who prescribes her medications and she reports that she takes them as prescribed. She denies any substance abuse issues, and reports that she lives alone and will go back to the same situation after discharge. Frances [name] was oriented to the unit and safety maintained.
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
## 2018-01-25 NOTE — Progress Notes (Signed)
Patient ID: Frances [name], female   DOB: 01/26/1976, 42 y.o.   MRN: [id number]   D: Patient came in today for passive SI and depression. Complains of headache that hasn't gone away. Takes mobic daily and has taken some tylenol earlier with no relief. Asked patient if she wanted me to as PA if she could have something else but she thinks that laying down to sleep may help. Concerned about not getting her vitamin supplements that she said she has to take in order to take rheumatoid arthritis medication on thursdays. She said they prevent her from having side effects. Patient conversations have been  Focused on her somatic complaints.  A: Staff will continue to monitor on q 15 minute checks, follow treatment plan, and give medications as ordered. R: Cooperative on the unit.

## 2018-01-25 NOTE — Tx Team (Signed)
Initial Treatment Plan 01/25/2018 4:02 PM Frances [name] [id number]    PATIENT STRESSORS: Loss of psychiatrist and therapist Medication change or noncompliance   PATIENT STRENGTHS: Ability for insight Average or above average intelligence Capable of independent living General fund of knowledge Motivation for treatment/growth   PATIENT IDENTIFIED PROBLEMS: Depression Mood swings Suicidal thoughts "I need help with my mood"                     DISCHARGE CRITERIA:  Ability to meet basic life and health needs Improved stabilization in mood, thinking, and/or behavior Reduction of life-threatening or endangering symptoms to within safe limits Verbal commitment to aftercare and medication compliance  PRELIMINARY DISCHARGE PLAN: Attend aftercare/continuing care group Return to previous living arrangement  PATIENT/FAMILY INVOLVEMENT: This treatment plan has been presented to and reviewed with the patient, Frances [name], and/or family member, .  The patient and family have been given the opportunity to ask questions and make suggestions.  Skylen Danielsen, [place], [place] 01/25/2018, 4:02 PM

## 2018-01-26 DIAGNOSIS — F419 Anxiety disorder, unspecified: Secondary | ICD-10-CM

## 2018-01-26 DIAGNOSIS — Z975 Presence of (intrauterine) contraceptive device: Secondary | ICD-10-CM

## 2018-01-26 DIAGNOSIS — Z818 Family history of other mental and behavioral disorders: Secondary | ICD-10-CM

## 2018-01-26 DIAGNOSIS — R110 Nausea: Secondary | ICD-10-CM

## 2018-01-26 DIAGNOSIS — J302 Other seasonal allergic rhinitis: Secondary | ICD-10-CM

## 2018-01-26 DIAGNOSIS — G4700 Insomnia, unspecified: Secondary | ICD-10-CM

## 2018-01-26 DIAGNOSIS — F314 Bipolar disorder, current episode depressed, severe, without psychotic features: Principal | ICD-10-CM

## 2018-01-26 DIAGNOSIS — M62838 Other muscle spasm: Secondary | ICD-10-CM

## 2018-01-26 DIAGNOSIS — Z811 Family history of alcohol abuse and dependence: Secondary | ICD-10-CM

## 2018-01-26 DIAGNOSIS — F39 Unspecified mood [affective] disorder: Secondary | ICD-10-CM

## 2018-01-26 MED ORDER — TRAMADOL HCL 50 MG PO TABS
50.0000 mg | ORAL_TABLET | Freq: Two times a day (BID) | ORAL | Status: DC | PRN
  Administered 2018-01-26 – 2018-01-29 (×4): 50 mg via ORAL
  Filled 2018-01-26 (×4): qty 1

## 2018-01-26 MED ORDER — TRAZODONE HCL 50 MG PO TABS
25.0000 mg | ORAL_TABLET | Freq: Every evening | ORAL | Status: DC | PRN
  Administered 2018-01-26 – 2018-01-27 (×2): 25 mg via ORAL
  Filled 2018-01-26: qty 5

## 2018-01-26 MED ORDER — ARIPIPRAZOLE 2 MG PO TABS
2.0000 mg | ORAL_TABLET | Freq: Every day | ORAL | Status: DC
  Administered 2018-01-26 – 2018-01-27 (×2): 2 mg via ORAL
  Filled 2018-01-26 (×6): qty 1

## 2018-01-26 NOTE — BHH Suicide Risk Assessment (Signed)
[facility] Admission Suicide Risk Assessment   Nursing information obtained from:   patient and chart  Demographic factors:   42 year old female  Current Mental Status:   see below Loss Factors:   unemployment  Historical Factors:   mood disorder, chronic pain Risk Reduction Factors:   resilience   Total Time spent with patient: 45 minutes Principal Problem:  Bipolar Disorder, Depressed  Diagnosis:   Patient Active Problem List   Diagnosis Date Noted  . Bipolar 1 disorder, depressed, severe ([place]) [F31.4] 01/25/2018  . Depression [F32.9]   . Anxiety [F41.9] 01/22/2018  . Borderline personality disorder ([place]) [F60.3] 01/22/2018  . Rheumatoid arthritis 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 ([place]) [M06.09] 12/09/2017  . 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 New daily persistent headache [G44.52] 09/26/2017  . MTHFR gene mutation ([place]) [E72.12] 09/20/2017  . Bipolar I disorder ([place]) [F31.9] 09/16/2017  . Fibromyalgia [M79.7] 09/16/2017  . Chronic fatigue syndrome [R53.82] 09/16/2017  . DYSPNEA [R06.02] 10/15/2011  . FEVER, RECURRENT [A68.9] 04/02/2009  . DERMATITIS, ALLERGIC [L25.9] 06/05/2008  . FATIGUE [R53.81, R53.83] 05/05/2008  . HYPOTHYROIDISM [E03.9] 02/10/2008  . 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 FIBROMYALGIA [IMO0001] 02/10/2008  . IRON, SERUM, ELEVATED [R79.89] 02/10/2008    Continued Clinical Symptoms:  Alcohol Use Disorder Identification Test Final Score (AUDIT): 0 The "Alcohol Use Disorders Identification Test", Guidelines for Use in Primary Care, Second Edition.  World Pharmacologist [facility]). Score between 0-7:  no or low risk or alcohol related problems. Score between 8-15:  moderate risk of alcohol related problems. Score between 16-19:  high risk of alcohol related problems. Score 20 or above:  warrants further diagnostic evaluation for alcohol dependence  and treatment.   CLINICAL FACTORS:   42 year old female, lives alone, currently unemployed , presented due to worsening mood , suicidal ideations, reports history of Bipolar Disorder .   Psychiatric Specialty Exam: Physical Exam  ROS  Blood pressure 103/70, pulse 86, temperature 97.9 F (36.6 C), temperature source Oral, resp. rate 20, height 5\' 7"  (1.702 m), weight 73.5 kg (162 lb).Body mass index is 25.37 kg/m.  See admit note MSE    COGNITIVE FEATURES THAT CONTRIBUTE TO RISK:  Closed-mindedness and Loss of executive function    SUICIDE RISK:   Moderate:  Frequent suicidal ideation with limited intensity, and duration, some specificity in terms of plans, no associated intent, good self-control, limited dysphoria/symptomatology, some risk factors present, and identifiable protective factors, including available and accessible social support.  PLAN OF CARE: Patient will be admitted to inpatient psychiatric unit for stabilization and safety. Will provide and encourage milieu participation. Provide medication management and maked adjustments as needed.  Will follow daily.    I certify that inpatient services furnished can reasonably be expected to improve the patient's condition.   Jenne Campus, MD 01/26/2018, 2:58 PM

## 2018-01-26 NOTE — [facility] Group Notes (Signed)
LCSW Group Therapy Note 01/26/2018 3:14 PM  Type of Therapy/Topic: Group Therapy: Feelings about Diagnosis  Participation Level: Active   Description of Group:  This group will allow patients to explore their thoughts and feelings about diagnoses they have received. Patients will be guided to explore their level of understanding and acceptance of these diagnoses. Facilitator will encourage patients to process their thoughts and feelings about the reactions of others to their diagnosis and will guide patients in identifying ways to discuss their diagnosis with significant others in their lives. This group will be process-oriented, with patients participating in exploration of their own experiences, giving and receiving support, and processing challenge from other group members.  Therapeutic Goals: 1. Patient will demonstrate understanding of diagnosis as evidenced by identifying two or more symptoms of the disorder 2. Patient will be able to express two feelings regarding the diagnosis 3. Patient will demonstrate their ability to communicate their needs through discussion and/or role play  Summary of Patient Progress:   Patient was engaged throughout the group session. She participated and contributed to the group's discussion regarding feelings about diagnosis.    Therapeutic Modalities:  Cognitive Behavioral Therapy Brief Therapy Feelings Identification    [place] Clinical Social Worker
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
## 2018-01-26 NOTE — [facility] Suicide Risk Assessment (Signed)
[place] INPATIENT:  Family/Significant Other Suicide Prevention Education  Suicide Prevention Education:  Patient Refusal for Family/Significant Other Suicide Prevention Education: The patient Frances [name] has refused to provide written consent for family/significant other to be provided Family/Significant Other Suicide Prevention Education during admission and/or prior to discharge.  Physician notified.  Joanne Chars, LCSW 01/26/2018, 11:45 AM

## 2018-01-26 NOTE — Progress Notes (Signed)
Observation note: Pt observed sitting in the dayroom with sitter present. Pt c/o an ongoing headache with no relief from previous meds. Pt requested tylenol and vistaril for discomfort. Pt provided with meds at her request. Pt remains on 1:1 observation for safety.

## 2018-01-26 NOTE — Progress Notes (Signed)
1:1 Observation note: Pt with sitter for safety. Pt remains irritable and easily agitated. Pt mood is labile. Pt is verbally aggressive and argumentative with staff. Pt verbalized concerns about  meds. Writer provided pt with a journal and encouraged pt to write down meds so that she can address medications with the MD.

## 2018-01-26 NOTE — Progress Notes (Signed)
Observation note: Pt observed lying in bed awake with sitter present at bedside. Pt c/o an ongoing headache. Ice pack offered to pt. Pt remains on 1:1 observation for safety.

## 2018-01-26 NOTE — Progress Notes (Signed)
Nursing Progress Note: 7p-7a D: Pt currently presents with a depressed/anxious affect and behavior. Pt states "I was feeling suicidal earlier. I am fine now. It's just this shit I just can't get away from. I don't want to talk about it. I am not suicidal any longer." Interacting minimally with the milieu. Pt reports off and on sleep during the previous night with current medication regimen. Pt did attend wrap-up group.  A: Pt provided with medications per providers orders. Pt's labs and vitals were monitored throughout the night. Pt supported emotionally and encouraged to express concerns and questions. Pt educated on medications.  R: Pt's safety ensured with 15 minute and environmental checks. Pt currently denies SI, HI, and AVH. Pt verbally contracts to seek staff if SI,HI, or AVH occurs and to consult with staff before acting on any harmful thoughts. Will continue to monitor.

## 2018-01-26 NOTE — H&P (Addendum)
Psychiatric Admission Assessment Adult  Patient Identification: Frances [name] MRN:  [id number] Date of Evaluation:  01/26/2018 Chief Complaint:  " feeling worse " Principal Diagnosis: Bipolar Disorder, Depressed  Diagnosis:   Patient Active Problem List   Diagnosis Date Noted  . Bipolar 1 disorder, depressed, severe ([place]) [F31.4] 01/25/2018  . Depression [F32.9]   . Anxiety [F41.9] 01/22/2018  . Borderline personality disorder ([place]) [F60.3] 01/22/2018  . Rheumatoid arthritis 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 ([place]) [M06.09] 12/09/2017  . 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 New daily persistent headache [G44.52] 09/26/2017  . MTHFR gene mutation ([place]) [E72.12] 09/20/2017  . Bipolar I disorder ([place]) [F31.9] 09/16/2017  . Fibromyalgia [M79.7] 09/16/2017  . Chronic fatigue syndrome [R53.82] 09/16/2017  . DYSPNEA [R06.02] 10/15/2011  . FEVER, RECURRENT [A68.9] 04/02/2009  . DERMATITIS, ALLERGIC [L25.9] 06/05/2008  . FATIGUE [R53.81, R53.83] 05/05/2008  . HYPOTHYROIDISM [E03.9] 02/10/2008  . 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 FIBROMYALGIA [IMO0001] 02/10/2008  . IRON, SERUM, ELEVATED [R79.89] 02/10/2008   History of Present Illness: 42 year old female . Patient presented to ED voluntarily on 2/3 due to worsening depression, short lived mood swings. Reports mood symptoms have been chronic, but seem to be worsening recently. States " I also starting harming myself again" over recent days to weeks- reports self injurious behaviors , such as punching walls and banging head .  Reports recent suicidal ideations with thoughts of overdosing  In addition to depressive symptoms as above, states that she has frequent short lived mood swings, irritability.[name] Describes neuro-vegetative symptoms of depression as below. Admission UDS is negative, admission BAL is <10.  Associated  Signs/Symptoms: Depression Symptoms:  depressed mood, anhedonia, insomnia, suicidal thoughts with specific plan, anxiety, loss of energy/fatigue, decreased appetite, decreased sense of self esteem (Hypo) Manic Symptoms: irritability  Anxiety Symptoms: reports history of occasional anxiety/panic attacks. States she has been more anxiety, " worry" recently. Psychotic Symptoms:  Denies  PTSD Symptoms: Does not endorse  Total Time spent with patient: 45 minutes  Past Psychiatric History: no prior psychiatric admissions, states she has never attempted suicide, denies history of self cutting, history of self injurious behaviors, mostly " when I get mad", mainly described as punching wall or banging head, or scratching self. Reports that in the past she has been diagnosed with Bipolar Disorder and with Borderline Personality Disorder in the past. Denies history of psychosis, does not endorse history of PTSD . Of note, does not currently endorse any clear history of mania. Does describe history of short lived mood swings, sometimes lasting only minutes or hours .  Patient reports history of chronic pain, she is prescribed Hydrocodone, Oxycodone, Ultram, but states she takes it only irregularly when pain is more severe. Of note, admission UDS negative .   Is the patient at risk to self? Yes.    Has the patient been a risk to self in the past 6 months? Yes.    Has the patient been a risk to self within the distant past? Yes.    Is the patient a risk to others? No.  Has the patient been a risk to others in the past 6 months? No.  Has the patient been a risk to others within the distant past? No.   Prior Inpatient Therapy:  denies  Prior Outpatient Therapy:  she had been following up with Dr.Aktar in the past , but not currently   Alcohol Screening:  1. How often do you have a drink containing alcohol?: Never 2. How many drinks containing alcohol do you have on a typical day when you are drinking?:  1 or 2 3. How often do you have six or more drinks on one occasion?: Never AUDIT-C Score: 0 4. How often during the last year have you found that you were not able to stop drinking once you had started?: Never 5. How often during the last year have you failed to do what was normally expected from you becasue of drinking?: Never 6. How often during the last year have you needed a first drink in the morning to get yourself going after a heavy drinking session?: Never 7. How often during the last year have you had a feeling of guilt of remorse after drinking?: Never 8. How often during the last year have you been unable to remember what happened the night before because you had been drinking?: Never 9. Have you or someone else been injured as a result of your drinking?: No 10. Has a relative or friend or a doctor or another health worker been concerned about your drinking or suggested you cut down?: No Alcohol Use Disorder Identification Test Final Score (AUDIT): 0 Intervention/Follow-up: AUDIT Score <7 follow-up not indicated Substance Abuse History in the last 12 months:  Denies alcohol or drug abuse .  Consequences of Substance Abuse: Denies  Previous Psychotropic Medications: Reports she on Mellaril for mood disorder. States she has been on it for several weeks.  She remembers having been on Prozac in the past, but states it made her feel worse, " more depressed, more anxious". Also remember having been on Effexor in the past.  Reports a prior trial with Neurontin caused her to feel worse , " like all over the place ".  Reports she tried Lamictal in the past, but states she feels it made her feel more anxious.  Psychological Evaluations:  No  Past Medical History: reports history of RA and DDD. Reports chronic back pain.  Past Medical History:  Diagnosis Date  . Anxiety   . Arthritis   . Back pain, chronic   . Bipolar 1 disorder ([place])   . Chronic fatigue syndrome   . DDD (degenerative  disc disease), lumbar   . Depression   . Fibromyalgia   . IUD 2012   Mirena  . MTHFR gene mutation [facility])     Past Surgical History:  Procedure Laterality Date  . COLONOSCOPY WITH PROPOFOL N/A 02/25/2017   Procedure: COLONOSCOPY WITH PROPOFOL;  Surgeon: Arta Silence, MD;  Location: [facility];  Service: Endoscopy;  Laterality: N/A;  . left knee lateral release  1993  . scar tisue removal  03/2005   left ankle  . TONSILLECTOMY  age 55's   Family History:  Parents alive, separated, states she has no relationship with father, has two siblings . Family History  Problem Relation Age of Onset  . Diabetes Mother   . Stroke Mother   . Lupus Mother   . Hypertension Mother   . Congestive Heart Failure Mother   . Mental illness Brother        Not clear what his diagnosis is--may be related to previous drug use.  . Cancer Maternal Grandmother        colon  . Depression Maternal Uncle   . Alcohol abuse Maternal Uncle   . Diabetes Maternal Grandfather    Family Psychiatric  History:  States a maternal aunt has bipolar disorder, maternal  uncle was alcoholic. No suicides in family  Tobacco Screening: Have you used any form of tobacco in the last 30 days? (Cigarettes, Smokeless Tobacco, Cigars, and/or Pipes): No Social History:  Single, no children, lives alone, has pet cats and horse, unemployed, states she does not have current source of income, denies legal issues . Social History   Substance and Sexual Activity  Alcohol Use No  . Alcohol/week: 0.0 oz     Social History   Substance and Sexual Activity  Drug Use No    Additional Social History: Marital status: Single Are you sexually active?: No What is your sexual orientation?: heterosexual Has your sexual activity been affected by drugs, alcohol, medication, or emotional stress?: n/a Does patient have children?: No  Allergies:   Allergies  Allergen Reactions  . Metaxalone Hives   Lab Results:  Results for orders placed  or performed during the hospital encounter of 01/24/18 (from the past 48 hour(s))  Rapid urine drug screen (hospital performed)     Status: None   Collection Time: 01/24/18  5:21 PM  Result Value Ref Range   Opiates NONE DETECTED NONE DETECTED   Cocaine NONE DETECTED NONE DETECTED   Benzodiazepines NONE DETECTED NONE DETECTED   Amphetamines NONE DETECTED NONE DETECTED   Tetrahydrocannabinol NONE DETECTED NONE DETECTED   Barbiturates NONE DETECTED NONE DETECTED    Comment: (NOTE) DRUG SCREEN FOR MEDICAL PURPOSES ONLY.  IF CONFIRMATION IS NEEDED FOR ANY PURPOSE, NOTIFY LAB WITHIN 5 DAYS. LOWEST DETECTABLE LIMITS FOR URINE DRUG SCREEN Drug Class                     Cutoff (ng/mL) Amphetamine and metabolites    1000 Barbiturate and metabolites    200 Benzodiazepine                 786 Tricyclics and metabolites     300 Opiates and metabolites        300 Cocaine and metabolites        300 THC                            50 Performed at [facility], [place] [address] [place],  [address]   Comprehensive metabolic panel     Status: Abnormal   Collection Time: 01/24/18  8:04 PM  Result Value Ref Range   Sodium 141 135 - 145 mmol/L   Potassium 3.4 (L) 3.5 - 5.1 mmol/L   Chloride 109 101 - 111 mmol/L   CO2 25 22 - 32 mmol/L   Glucose, Bld 169 (H) 65 - 99 mg/dL   BUN 15 6 - 20 mg/dL   Creatinine, Ser 0.95 0.44 - 1.00 mg/dL   Calcium 8.9 8.9 - 10.3 mg/dL   Total Protein 6.6 6.5 - 8.1 g/dL   Albumin 4.2 3.5 - 5.0 g/dL   AST 25 15 - 41 U/L   ALT 20 14 - 54 U/L   Alkaline Phosphatase 53 38 - 126 U/L   Total Bilirubin 0.7 0.3 - 1.2 mg/dL   GFR calc non Af Amer >60 >60 mL/min   GFR calc Af Amer >60 >60 mL/min    Comment: (NOTE) The eGFR has been calculated using the CKD EPI equation. This calculation has not been validated in all clinical situations. eGFR's persistently <60 mL/min signify possible Chronic Kidney Disease.    Anion gap 7 5 - 15    Comment:  Performed at [facility], [place] [address] [place], [place] [address]  Ethanol     Status: None   Collection Time: 01/24/18  8:04 PM  Result Value Ref Range   Alcohol, Ethyl (B) <10 <10 mg/dL    Comment:        LOWEST DETECTABLE LIMIT FOR SERUM ALCOHOL IS 10 mg/dL FOR MEDICAL PURPOSES ONLY Performed at [facility], [place] [address], [place], [place] [address]   Salicylate level     Status: None   Collection Time: 01/24/18  8:04 PM  Result Value Ref Range   Salicylate Lvl <3.9 2.8 - 30.0 mg/dL    Comment: Performed at [facility], [place] [address], [place], [place] [address]  Acetaminophen level     Status: Abnormal   Collection Time: 01/24/18  8:04 PM  Result Value Ref Range   Acetaminophen (Tylenol), Serum <10 (L) 10 - 30 ug/mL    Comment:        THERAPEUTIC CONCENTRATIONS VARY SIGNIFICANTLY. A RANGE OF 10-30 ug/mL MAY BE AN EFFECTIVE CONCENTRATION FOR MANY PATIENTS. HOWEVER, SOME ARE BEST TREATED AT CONCENTRATIONS OUTSIDE THIS RANGE. ACETAMINOPHEN CONCENTRATIONS >150 ug/mL AT 4 HOURS AFTER INGESTION AND >50 ug/mL AT 12 HOURS AFTER INGESTION ARE OFTEN ASSOCIATED WITH TOXIC REACTIONS. Performed at [facility], [place] [address] [place], [place] [address]   cbc     Status: None   Collection Time: 01/24/18  8:04 PM  Result Value Ref Range   WBC 7.3 4.0 - 10.5 K/uL   RBC 4.22 3.87 - 5.11 MIL/uL   Hemoglobin 13.5 12.0 - 15.0 g/dL   HCT 38.4 36.0 - 46.0 %   MCV 91.0 78.0 - 100.0 fL   MCH 32.0 26.0 - 34.0 pg   MCHC 35.2 30.0 - 36.0 g/dL   RDW 13.8 11.5 - 15.5 %   Platelets 196 150 - 400 K/uL    Comment: Performed at [facility], [place] [address], [place], [place] [address]  Troponin I     Status: None   Collection Time: 01/24/18  8:04 PM  Result Value Ref Range   Troponin I <0.03 <0.03 ng/mL    Comment: Performed at [facility], [place] [address],  [place], [place] [address]  hCG, serum, qualitative     Status: None   Collection Time: 01/24/18 10:06 PM  Result Value Ref Range   Preg, Serum NEGATIVE NEGATIVE    Comment: Performed at [facility], [place] [address] [place],  [address]    Blood Alcohol level:  Lab Results  Component Value Date   ETH <10 38/93/7342    Metabolic Disorder Labs:  Lab Results  Component Value Date   HGBA1C 5.6 03/04/2016   No results found for: PROLACTIN Lab Results  Component Value Date   CHOL 152 03/04/2016   TRIG 81 03/04/2016   HDL 37 (L) 03/04/2016   LDLCALC 99 03/04/2016   LDLCALC 71 12/20/2007    Current Medications: Current Facility-Administered Medications  Medication Dose Route Frequency Provider Last Rate Last Dose  . acetaminophen (TYLENOL) tablet 650 mg  650 mg Oral Q4H PRN Ethelene Hal, NP   650 mg at 01/26/18 8768  . alum & mag hydroxide-simeth (MAALOX/MYLANTA) 200-200-20 MG/5ML suspension 30 mL  30 mL Oral Q4H PRN Ethelene Hal, NP      . hydrOXYzine (ATARAX/VISTARIL) tablet 25 mg  25 mg Oral TID PRN Ethelene Hal, NP  25 mg at 01/26/18 0838  . magnesium hydroxide (MILK OF MAGNESIA) suspension 30 mL  30 mL Oral Daily PRN Ethelene Hal, NP      . meloxicam [facility]) tablet 15 mg  15 mg Oral Daily Ethelene Hal, NP   15 mg at 01/26/18 0835  . montelukast (SINGULAIR) tablet 10 mg  10 mg Oral QHS Ethelene Hal, NP   10 mg at 01/25/18 2143  . thioridazine (MELLARIL) tablet 10 mg  10 mg Oral BID Ethelene Hal, NP   10 mg at 01/25/18 1707  . zolpidem (AMBIEN) tablet 5 mg  5 mg Oral QHS PRN Ethelene Hal, NP   5 mg at 01/25/18 2143   PTA Medications: Medications Prior to Admission  Medication Sig Dispense Refill Last Dose  . Carboxymethylcellul-Glycerin (LUBRICATING EYE DROPS OP) Apply 1 drop to eye 2 (two) times daily as needed (dry eyes).   Past Month at Unknown time  . cetirizine (ZYRTEC) 10 MG  tablet Take 1 tablet (10 mg total) by mouth daily. 30 tablet 5 01/24/2018 at Unknown time  . Folinic Acid-Vit B6-Vit B12 4-50-2 MG TABS Take 1 tablet by mouth every other day.   01/24/2018 at Unknown time  . Glucosamine HCl (GLUCOSAMINE PO) Take 1 tablet by mouth daily.    01/24/2018 at Unknown time  . HYDROcodone-acetaminophen (NORCO) 5-325 MG per tablet Take 1 tablet by mouth every 6 (six) hours as needed for moderate pain.    Past Week at Unknown time  . l-methylfolate-B6-B12 (METANX) 3-35-2 MG TABS tablet Take 1 tablet by mouth every other day.    01/23/2018 at Unknown time  . MAGNESIUM GLYCINATE PLUS PO Take 1 tablet by mouth 2 (two) times daily.    01/24/2018 at Unknown time  . meloxicam (MOBIC) 15 MG tablet Take 1 tablet (15 mg total) by mouth daily. 90 tablet 2 2 weeks  . methotrexate (RHEUMATREX) 2.5 MG tablet Take 10 mg by mouth every Thursday.   0 01/21/2018  . mometasone (NASONEX) 50 MCG/ACT nasal spray Place 2 sprays into the nose daily. (Patient taking differently: Place 1 spray into the nose 2 (two) times daily. ) 17 g 3 01/24/2018 at Unknown time  . montelukast (SINGULAIR) 10 MG tablet Take 1 tablet (10 mg total) by mouth at bedtime. 30 tablet 5 01/23/2018 at Unknown time  . naproxen (NAPROSYN) 500 MG tablet Take 1 tablet (500 mg total) by mouth 2 (two) times daily with a meal. 180 tablet 0 01/24/2018 at 0830  . Omega-3 Fatty Acids (FISH OIL PO) Take 1 capsule by mouth daily.    01/24/2018 at Unknown time  . Oxycodone HCl 10 MG TABS Take 5 mg by mouth 2 (two) times daily as needed (pain).   01/20/2018  . PROVENTIL HFA 108 (90 Base) MCG/ACT inhaler INHALE 2 PUFFS BY MOUTH FOUR TIMES DAILY AS NEEDED FOR COUGHING, WHEEZING, OR SHORTNESS OF BREATH  0 unk  . thioridazine (MELLARIL) 10 MG tablet Take 1 tablet (10 mg total) by mouth 2 (two) times daily. Can take both at night if sedated (Patient taking differently: Take 10 mg by mouth every evening. ) 60 tablet 1 01/23/2018 at Unknown time  . tizanidine (ZANAFLEX)  2 MG capsule Take 2 mg by mouth 3 (three) times daily as needed for muscle spasms (sleep).    01/23/2018 at Unknown time  . traMADol (ULTRAM) 50 MG tablet Take 25-100 mg by mouth 2 (two) times daily as needed for moderate pain.  Past Week at Unknown time  . traZODone (DESYREL) 50 MG tablet Take 1 tablet (50 mg total) by mouth at bedtime. 30 tablet 1     Musculoskeletal: Strength & Muscle Tone: within normal limits Gait & Station: normal Patient leans: N/A  Psychiatric Specialty Exam: Physical Exam  Review of Systems  Constitutional: Negative.   HENT: Negative.   Eyes: Negative.   Respiratory: Negative.   Cardiovascular: Negative.   Gastrointestinal: Negative for diarrhea, nausea and vomiting.  Genitourinary: Negative.   Musculoskeletal:       Reports history of chronic pain, which she states affects her hands, feet, lower back , neck  Skin: Negative.   Neurological: Positive for headaches.  Endo/Heme/Allergies: Negative.   Psychiatric/Behavioral: Positive for depression and suicidal ideas. The patient is nervous/anxious.   All other systems reviewed and are negative.   Blood pressure 103/70, pulse 86, temperature 97.9 F (36.6 C), temperature source Oral, resp. rate 20, height 5' 7"  (1.702 m), weight 73.5 kg (162 lb).Body mass index is 25.37 kg/m.  General Appearance: Fairly Groomed  Eye Contact:  Fair  Speech:  Normal Rate- not pressured   Volume:  Normal  Mood:  reports feeling depressed and anxious  Affect:  constricted, vaguely irritable   Thought Process:  Linear and Descriptions of Associations: Intact  Orientation:  Other:  fully alert and attentive  Thought Content:  no hallucinations, no delusions expressed, not internally preoccupied   Suicidal Thoughts:  Yes.  without intent/plan- currently denies plan or intention. Denies homicidal ideations   Homicidal Thoughts:  No  Memory:  recent and remote grossly intact   Judgement:  Fair  Insight:  Fair  Psychomotor  Activity:  Negative  Concentration:  Concentration: Good and Attention Span: Good  Recall:  Good  Fund of Knowledge:  Good  Language:  Good  Akathisia:  Negative  Handed:  Right  AIMS (if indicated):     Assets:  Communication Skills Desire for Improvement Resilience  ADL's:  Intact  Cognition:  WNL  Sleep:  Number of Hours: 5    Treatment Plan Summary: Daily contact with patient to assess and evaluate symptoms and progress in treatment, Medication management, Plan inpatient treatment  and medications as below  Observation Level/Precautions:  1 to 1 patient is currently on one to one observation as she had been unable to contract for safety   Laboratory:  check TSH  Psychotherapy:  [place], group therapy   Medications:   Patient reports history of being very sensitive to medication side effects and expresses desire to start at lower doses of medications to minimize risk of side effects. Start Abilify 2 mgrs QDAY Start Trazodone 25 mgrs QHS PRN for insomnia  Continue Mobic 15 mgrs QDAY for arthritis, chronic pain  D/C Mellaril  D/C Ambien At this time will not restart opiates ( patient reports irregular use and admission UDS negative)  She does take Ultram at home without side effects or issues . Start Ultram 50 mgrs Q 12 hours PRN for pain as needed .    Consultations:  As needed   Discharge Concerns:  -  Estimated LOS: 5-6 days   Other:     Physician Treatment Plan for Primary Diagnosis: Depression   Long Term Goal(s): Improvement in symptoms so as ready for discharge  Short Term Goals: Ability to identify changes in lifestyle to reduce recurrence of condition will improve and Ability to identify and develop effective coping behaviors will improve  Physician Treatment Plan for Secondary  Diagnosis: Active Problems:   Bipolar 1 disorder, depressed, severe ([place])  Long Term Goal(s): Improvement in symptoms so as ready for discharge  Short Term Goals: Ability to verbalize  feelings will improve, Ability to disclose and discuss suicidal ideas, Ability to demonstrate self-control will improve, Ability to identify and develop effective coping behaviors will improve and Ability to maintain clinical measurements within normal limits will improve  I certify that inpatient services furnished can reasonably be expected to improve the patient's condition.    Jenne Campus, MD 2/5/20192:10 PM

## 2018-01-26 NOTE — Progress Notes (Signed)
Initial 1:1 observation note: Pt placed on 1:1 observation per Dr. Parke Poisson. Pt verbalizes SI with any means of hurting herself because she no longer wants to be alive. Pt unable to contract for safety. Pt also labile on approach. Pt cursed at Probation officer several times this morning on approach. Pt refused to take scheduled antipsychotic med this morning as ordered. Pt stated that she takes it daily in the evening and that it causes side effects. Pt then said she would take the med but then refused because this nurse touched the pill.

## 2018-01-26 NOTE — Progress Notes (Signed)
1:1 Progress Note D: Pt currently in hall on phone. Patient appropriate to situation. Pt in no current distress.  A: Sitter is currently sitting with patient. R: Pt remains safe on a 1:1 per MD orders.

## 2018-01-26 NOTE — Progress Notes (Signed)
Pt attended orientation/ goals group. During this group Pt discussed their treatment agreement, went over unit rules, and planned goals for the day.

## 2018-01-26 NOTE — Progress Notes (Signed)
Adult Psychoeducational Group Note  Date:  01/26/2018 Time:  10:01 PM  Group Topic/Focus:  Wrap-Up Group:   The focus of this group is to help patients review their daily goal of treatment and discuss progress on daily workbooks.  Participation Level:  Active  Participation Quality:  Appropriate  Affect:  Appropriate  Cognitive:  Alert  Insight: Appropriate  Engagement in Group:  Engaged  Modes of Intervention:  Discussion  Additional Comments:  Patient stated her day started off rough. Patient stated she had a lot to work on.   Charmayne Odell L Lelah Rennaker 01/26/2018, 10:01 PM

## 2018-01-27 LAB — TSH: TSH: 1.616 u[IU]/mL (ref 0.350–4.500)

## 2018-01-27 NOTE — Progress Notes (Signed)
1:1 Note Pt ate dinner in the room, the writer talked with pt earlier, pt stated she was not feeling, could not explain why, pt kept on saying "If I get up my head is going to hurt." Pt was able to get to the medication window and took her medication. No unwanted behavior observed or reported, staff remains with the pt, will continue to monitor.

## 2018-01-27 NOTE — Progress Notes (Signed)
1;1 Note Pt is in the dayroom attending group, staff with the pt, will continue to monitor.

## 2018-01-27 NOTE — [facility] Group Notes (Signed)
Pt did not attend Psychoeducational group. 

## 2018-01-27 NOTE — Progress Notes (Signed)
Nursing Progress Note: 7p-7a D: Pt currently presents with a anxious/depressed affect and behavior. Pt states "I'm okay. I just feel down... I'm not suicidal." Interacting minimally with the milieu. Pt reports fair sleep during the previous night with current medication regimen. Pt did attend wrap-up group.  A: Pt provided with medications per providers orders. Pt's labs and vitals were monitored throughout the night. Pt supported emotionally and encouraged to express concerns and questions. Pt educated on medications.  R: Pt's safety ensured with 15 minute and environmental checks. Pt currently denies SI, HI, and AVH. Pt verbally contracts to seek staff if SI,HI, or AVH occurs and to consult with staff before acting on any harmful thoughts. Will continue to monitor.

## 2018-01-27 NOTE — Progress Notes (Signed)
1:1 Note Pt is currently attending group. Pt took her medication this without problems. Pt stated she is feeling better today as compared to yesterday, pt she was having hard time yesterday but looking forward to having a good day today. Pt denies SI and verbally contracted for safety, staff remain with pt, will continue to monitor.

## 2018-01-27 NOTE — Progress Notes (Signed)
CSW spoke to Scientist, research (physical sciences) at [facility] outpt.  Pt is not able to be served at any [facility] outpt location due to being dismissed at [place].  CSW spoke to [place] who reports that pt would have to self pay there.  Pt reporting she has [facility] financial assistance, but Velora Heckler says that does not cover services there.  CSW spoke with pt about this and discussed options.  Pt finishing up DBT group at [facility] but there is only one more week.  Pt states she will not go to [facility] or [facility] and that she is "done with these free places." Winferd Humphrey, MSW, LCSW Clinical Social Worker 01/27/2018 2:28 PM

## 2018-01-27 NOTE — Progress Notes (Signed)
Adult Psychoeducational Group Note  Date:  01/27/2018 Time:  1345  Group Topic/Focus:  Relaxation Group  Relaxation activity to help cope with anxiety and depression.   Participation Level:  Active  Participation Quality:  Appropriate  Affect:  Appropriate  Cognitive:  Appropriate  Insight: Appropriate  Engagement in Group:  Engaged  Modes of Intervention:  Activity  Additional Comments:    Hydeia Mcatee L

## 2018-01-27 NOTE — Progress Notes (Signed)
1:1 Progress Note D: Pt currently standing at nurses's station getting vital signs taken. Patient appropriate to situation. Pt in no current distress.  A: Sitter is currently at nursing station with patient. R: Pt remains safe on a 1:1 per MD orders.

## 2018-01-27 NOTE — [facility] Group Notes (Signed)
[facility] Group Notes:  (Nursing/MHT/Case Management/Adjunct)  Date:  01/27/2018  Time:  9:11 AM  Type of Therapy:  Orientation/ goals group  Participation Level:  Active  Participation Quality:  Appropriate  Affect:  Appropriate  Cognitive:  Alert  Insight:  Good  Engagement in Group:  Engaged  Modes of Intervention:  Discussion and Education  Summary of Progress/Problems:  Pt participated group. During group pt completed her self-inventory. During group, pts wrote down ten positive self-affirmations. Discussion consisted of personal development. Group addressed why it is important to stop doubting ourselves, and how to go about it. Pt shared two of her affirmations during group. 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  Lita Mains 01/27/2018, 9:11 AM

## 2018-01-27 NOTE — Tx Team (Signed)
Interdisciplinary Treatment and Diagnostic Plan Update  01/27/2018 Time of Session: [place] MRN: [id number]  Principal Diagnosis: <principal problem not specified>  Secondary Diagnoses: Active Problems:   Bipolar 1 disorder, depressed, severe (HCC)   Current Medications:  Current Facility-Administered Medications  Medication Dose Route Frequency Provider Last Rate Last Dose  . acetaminophen (TYLENOL) tablet 650 mg  650 mg Oral Q4H PRN Ethelene Hal, NP   650 mg at 01/26/18 1713  . alum & mag hydroxide-simeth (MAALOX/MYLANTA) 200-200-20 MG/5ML suspension 30 mL  30 mL Oral Q4H PRN Ethelene Hal, NP      . ARIPiprazole (ABILIFY) tablet 2 mg  2 mg Oral Daily Cobos, Myer Peer, MD   2 mg at 01/27/18 0801  . hydrOXYzine (ATARAX/VISTARIL) tablet 25 mg  25 mg Oral TID PRN Ethelene Hal, NP   25 mg at 01/27/18 0804  . magnesium hydroxide (MILK OF MAGNESIA) suspension 30 mL  30 mL Oral Daily PRN Ethelene Hal, NP      . meloxicam [facility]) tablet 15 mg  15 mg Oral Daily Ethelene Hal, NP   15 mg at 01/27/18 0801  . montelukast (SINGULAIR) tablet 10 mg  10 mg Oral QHS Ethelene Hal, NP   10 mg at 01/26/18 2131  . traMADol (ULTRAM) tablet 50 mg  50 mg Oral Q12H PRN Cobos, Myer Peer, MD   50 mg at 01/27/18 0804  . traZODone (DESYREL) tablet 25 mg  25 mg Oral QHS PRN Cobos, Myer Peer, MD   25 mg at 01/26/18 2131   PTA Medications: Medications Prior to Admission  Medication Sig Dispense Refill Last Dose  . Carboxymethylcellul-Glycerin (LUBRICATING EYE DROPS OP) Apply 1 drop to eye 2 (two) times daily as needed (dry eyes).   Past Month at Unknown time  . cetirizine (ZYRTEC) 10 MG tablet Take 1 tablet (10 mg total) by mouth daily. 30 tablet 5 01/24/2018 at Unknown time  . Folinic Acid-Vit B6-Vit B12 4-50-2 MG TABS Take 1 tablet by mouth every other day.   01/24/2018 at Unknown time  . Glucosamine HCl (GLUCOSAMINE PO) Take 1 tablet by mouth  daily.    01/24/2018 at Unknown time  . HYDROcodone-acetaminophen (NORCO) 5-325 MG per tablet Take 1 tablet by mouth every 6 (six) hours as needed for moderate pain.    Past Week at Unknown time  . l-methylfolate-B6-B12 (METANX) 3-35-2 MG TABS tablet Take 1 tablet by mouth every other day.    01/23/2018 at Unknown time  . MAGNESIUM GLYCINATE PLUS PO Take 1 tablet by mouth 2 (two) times daily.    01/24/2018 at Unknown time  . meloxicam (MOBIC) 15 MG tablet Take 1 tablet (15 mg total) by mouth daily. 90 tablet 2 2 weeks  . methotrexate (RHEUMATREX) 2.5 MG tablet Take 10 mg by mouth every Thursday.   0 01/21/2018  . mometasone (NASONEX) 50 MCG/ACT nasal spray Place 2 sprays into the nose daily. (Patient taking differently: Place 1 spray into the nose 2 (two) times daily. ) 17 g 3 01/24/2018 at Unknown time  . montelukast (SINGULAIR) 10 MG tablet Take 1 tablet (10 mg total) by mouth at bedtime. 30 tablet 5 01/23/2018 at Unknown time  . naproxen (NAPROSYN) 500 MG tablet Take 1 tablet (500 mg total) by mouth 2 (two) times daily with a meal. 180 tablet 0 01/24/2018 at 0830  . Omega-3 Fatty Acids (FISH OIL PO) Take 1 capsule by mouth daily.    01/24/2018 at Unknown  time  . Oxycodone HCl 10 MG TABS Take 5 mg by mouth 2 (two) times daily as needed (pain).   01/20/2018  . PROVENTIL HFA 108 (90 Base) MCG/ACT inhaler INHALE 2 PUFFS BY MOUTH FOUR TIMES DAILY AS NEEDED FOR COUGHING, WHEEZING, OR SHORTNESS OF BREATH  0 unk  . thioridazine (MELLARIL) 10 MG tablet Take 1 tablet (10 mg total) by mouth 2 (two) times daily. Can take both at night if sedated (Patient taking differently: Take 10 mg by mouth every evening. ) 60 tablet 1 01/23/2018 at Unknown time  . tizanidine (ZANAFLEX) 2 MG capsule Take 2 mg by mouth 3 (three) times daily as needed for muscle spasms (sleep).    01/23/2018 at Unknown time  . traMADol (ULTRAM) 50 MG tablet Take 25-100 mg by mouth 2 (two) times daily as needed for moderate pain.   Past Week at Unknown time  .  traZODone (DESYREL) 50 MG tablet Take 1 tablet (50 mg total) by mouth at bedtime. 30 tablet 1     Patient Stressors: Loss of psychiatrist and therapist Medication change or noncompliance  Patient Strengths: Ability for insight Average or above average intelligence Capable of independent living General fund of knowledge Motivation for treatment/growth  Treatment Modalities: Medication Management, Group therapy, Case management,  1 to 1 session with clinician, Psychoeducation, Recreational therapy.   Physician Treatment Plan for Primary Diagnosis: <principal problem not specified> Long Term Goal(s): Improvement in symptoms so as ready for discharge Improvement in symptoms so as ready for discharge   Short Term Goals: Ability to identify changes in lifestyle to reduce recurrence of condition will improve Ability to identify and develop effective coping behaviors will improve Ability to verbalize feelings will improve Ability to disclose and discuss suicidal ideas Ability to demonstrate self-control will improve Ability to identify and develop effective coping behaviors will improve Ability to maintain clinical measurements within normal limits will improve  Medication Management: Evaluate patient's response, side effects, and tolerance of medication regimen.  Therapeutic Interventions: 1 to 1 sessions, Unit Group sessions and Medication administration.  Evaluation of Outcomes: Progressing  Physician Treatment Plan for Secondary Diagnosis: Active Problems:   Bipolar 1 disorder, depressed, severe ([place])  Long Term Goal(s): Improvement in symptoms so as ready for discharge Improvement in symptoms so as ready for discharge   Short Term Goals: Ability to identify changes in lifestyle to reduce recurrence of condition will improve Ability to identify and develop effective coping behaviors will improve Ability to verbalize feelings will improve Ability to disclose and discuss suicidal  ideas Ability to demonstrate self-control will improve Ability to identify and develop effective coping behaviors will improve Ability to maintain clinical measurements within normal limits will improve     Medication Management: Evaluate patient's response, side effects, and tolerance of medication regimen.  Therapeutic Interventions: 1 to 1 sessions, Unit Group sessions and Medication administration.  Evaluation of Outcomes: Progressing   RN Treatment Plan for Primary Diagnosis: <principal problem not specified> Long Term Goal(s): Knowledge of disease and therapeutic regimen to maintain health will improve  Short Term Goals: Ability to identify and develop effective coping behaviors will improve and Compliance with prescribed medications will improve  Medication Management: RN will administer medications as ordered by provider, will assess and evaluate patient's response and provide education to patient for prescribed medication. RN will report any adverse and/or side effects to prescribing provider.  Therapeutic Interventions: 1 on 1 counseling sessions, Psychoeducation, Medication administration, Evaluate responses to treatment, Monitor vital signs and  CBGs as ordered, Perform/monitor CIWA, COWS, AIMS and Fall Risk screenings as ordered, Perform wound care treatments as ordered.  Evaluation of Outcomes: Progressing   LCSW Treatment Plan for Primary Diagnosis: <principal problem not specified> Long Term Goal(s): Safe transition to appropriate next level of care at discharge, Engage patient in therapeutic group addressing interpersonal concerns.  Short Term Goals: Engage patient in aftercare planning with referrals and resources, Increase social support and Increase skills for wellness and recovery  Therapeutic Interventions: Assess for all discharge needs, 1 to 1 time with Social worker, Explore available resources and support systems, Assess for adequacy in community support network,  Educate family and significant other(s) on suicide prevention, Complete Psychosocial Assessment, Interpersonal group therapy.  Evaluation of Outcomes: Progressing   Progress in Treatment: Attending groups: Yes. Participating in groups: Yes. Taking medication as prescribed: Yes. Toleration medication: Yes. Family/Significant other contact made: No, will contact:  pt refused Patient understands diagnosis: Yes. Discussing patient identified problems/goals with staff: Yes. Medical problems stabilized or resolved: Yes. Denies suicidal/homicidal ideation: Yes. Issues/concerns per patient self-inventory: No. Other: none  New problem(s) identified: No, Describe:  none  New Short Term/Long Term Goal(s):Pt goal: "to get my moods under control, quit hurting myself."  Discharge Plan or Barriers:   Reason for Continuation of Hospitalization: Depression Medication stabilization Suicidal ideation  Estimated Length of Stay: 3-5 days.  Attendees: Patient:Frances [name] 01/27/2018   Physician: Dr Parke Poisson, MD 01/27/2018   Nursing: Darrol Angel, RN 01/27/2018   RN Care Manager: 01/27/2018   Social Worker: Lurline Idol, LCSW 01/27/2018   Recreational Therapist:  01/27/2018   Other:  01/27/2018   Other:  01/27/2018   Other: 01/27/2018        Scribe for Treatment Team: Joanne Chars, LCSW 01/27/2018 9:55 AM

## 2018-01-27 NOTE — [facility] Group Notes (Signed)
[facility] Mental Health Association Group Therapy 01/27/2018 1:15pm  Type of Therapy: Mental Health Association Presentation  Participation Level: Active  Participation Quality: Attentive  Affect: Appropriate  Cognitive: Oriented  Insight: Developing/Improving  Engagement in Therapy: Engaged  Modes of Intervention: Discussion, Education and Socialization  Summary of Progress/Problems: [place] Speaker came to talk about his personal journey with mental health. The pt processed ways by which to relate to the speaker. [place] speaker provided handouts and educational information pertaining to groups and services offered by the [facility]. Pt was engaged in speaker's presentation and was receptive to resources provided.    [employer], LCSW 01/27/2018 11:10 AM

## 2018-01-27 NOTE — Progress Notes (Signed)
[facility] MD Progress Note  01/27/2018 11:24 AM Frances [name]  MRN:  [id number] Subjective:  Patient is a 42 year old Caucasian female who is here for depression and agitated mood swings at home. Patient notes that she is feeling much better today than yesterday. Please see prior patient notes. Patient originally presented to ED voluntarily on 2/3 due to worsening depression, and mood swings. Reports mood symptoms per patient have been chronic, but seem to have been worsening due to medications not working in the way that the patient would like. Patient also notes previously that her mood swings have caused her to " starting harming myself again" over recent days to weeks- reports self injurious behaviors , such as punching walls and banging head. Today patient denies suicidal ideation, thoughts of hurting herself or others and feels safe at the facility BUT will not contract for safety without a sitter or being in the hospital. Reports going off when she is mad but cannot predict when that will be and will not agree to contact staff.  Patient today specifically feels that her trazodone has not helped her to be as rested as she would normally like to be and also feels that this medication made her feel sick initially after taking it. Patient does however note that the vistaril and ultram have helped to calm her thoughts and get her mood under control. Patient notes she was previously best managed on Melaril. Patient notes that her current goal for care is to continue to get her thoughts and moods under control.  Principal Problem:  Bipolar affective disorder, depressed severe without psychosis  Diagnosis:   Patient Active Problem List   Diagnosis Date Noted  . Bipolar 1 disorder, depressed, severe ([place]) [F31.4] 01/25/2018  . Depression [F32.9]   . Anxiety [F41.9] 01/22/2018  . Borderline personality disorder ([place]) [F60.3] 01/22/2018  . Rheumatoid arthritis of multiple sites with negative rheumatoid  factor ([place]) [M06.09] 12/09/2017  . 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 New daily persistent headache [G44.52] 09/26/2017  . MTHFR gene mutation ([place]) [E72.12] 09/20/2017  . Bipolar I disorder ([place]) [F31.9] 09/16/2017  . Fibromyalgia [M79.7] 09/16/2017  . Chronic fatigue syndrome [R53.82] 09/16/2017  . DYSPNEA [R06.02] 10/15/2011  . FEVER, RECURRENT [A68.9] 04/02/2009  . DERMATITIS, ALLERGIC [L25.9] 06/05/2008  . FATIGUE [R53.81, R53.83] 05/05/2008  . HYPOTHYROIDISM [E03.9] 02/10/2008  . 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 FIBROMYALGIA [IMO0001] 02/10/2008  . IRON, SERUM, ELEVATED [R79.89] 02/10/2008   Total Time spent with patient: 30 minutes  Past Psychiatric History:  Patient denies prior psychiatric admissions, states she has never attempted suicide, denies history of self cutting, history of self injurious behaviors, mostly " when I get mad", mainly described as punching wall or banging head, or scratching self. Reports that in the past she has been diagnosed with Bipolar Disorder and with Borderline Personality Disorder in the past. Denies history of psychosis, does not endorse history of PTSD . Of note, does not currently endorse any clear history of mania. Does describe history of short lived mood swings, sometimes lasting only minutes or hours .  Patient reports history of chronic pain, she is prescribed Hydrocodone, Oxycodone, Ultram, but states she takes it only irregularly when pain is more severe. Of note, admission UDS negative .  Past Medical History:  Past Medical History:  Diagnosis Date  . Anxiety   . Arthritis   . Back pain, chronic   .  Bipolar 1 disorder ([place])   . Chronic fatigue syndrome   . DDD (degenerative disc disease), lumbar   . Depression   . Fibromyalgia   . IUD 2012   Mirena  . MTHFR gene mutation [facility])     Past Surgical History:  Procedure  Laterality Date  . COLONOSCOPY WITH PROPOFOL N/A 02/25/2017   Procedure: COLONOSCOPY WITH PROPOFOL;  Surgeon: Arta Silence, MD;  Location: [facility] ENDOSCOPY;  Service: Endoscopy;  Laterality: N/A;  . left knee lateral release  1993  . scar tisue removal  03/2005   left ankle  . TONSILLECTOMY  age 62's   Family History:  Family History  Problem Relation Age of Onset  . Diabetes Mother   . Stroke Mother   . Lupus Mother   . Hypertension Mother   . Congestive Heart Failure Mother   . Mental illness Brother        Not clear what his diagnosis is--may be related to previous drug use.  . Cancer Maternal Grandmother        colon  . Depression Maternal Uncle   . Alcohol abuse Maternal Uncle   . Diabetes Maternal Grandfather    Social History:  Social History   Substance and Sexual Activity  Alcohol Use No  . Alcohol/week: 0.0 oz     Social History   Substance and Sexual Activity  Drug Use No    Social History   Socioeconomic History  . Marital status: Single    Spouse name: None  . Number of children: 0  . Years of education: None  . Highest education level: Bachelor's degree (e.g., BA, AB, BS)  Social Needs  . Financial resource strain: None  . Food insecurity - worry: None  . Food insecurity - inability: None  . Transportation needs - medical: None  . Transportation needs - non-medical: None  Occupational History  . Occupation: Designer, industrial/product at times  Tobacco Use  . Smoking status: Never Smoker  . Smokeless tobacco: Never Used  Substance and Sexual Activity  . Alcohol use: No    Alcohol/week: 0.0 oz  . Drug use: No  . Sexual activity: Yes    Birth control/protection: IUD  Other Topics Concern  . None  Social History Narrative   Originally from [place], outside of [place].    Moved here permanently 2012.   Intermittently worked on a farm.   Lives with many cats--3 plus fosters cats.    Single, lives alone in a one story home. Rarely drinks caffeine. 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    Sleep: Fair  Appetite:  Good  Current Medications: Current Facility-Administered Medications  Medication Dose Route Frequency Provider Last Rate Last Dose  . acetaminophen (TYLENOL) tablet 650 mg  650 mg Oral Q4H PRN Ethelene Hal, NP   650 mg at 01/26/18 1713  . alum & mag hydroxide-simeth (MAALOX/MYLANTA) 200-200-20 MG/5ML suspension 30 mL  30 mL Oral Q4H PRN Ethelene Hal, NP      . ARIPiprazole (ABILIFY) tablet 2 mg  2 mg Oral Daily Cobos, Myer Peer, MD   2 mg at 01/27/18 0801  . hydrOXYzine (ATARAX/VISTARIL) tablet 25 mg  25 mg Oral TID PRN Ethelene Hal, NP   25 mg at 01/27/18 0804  . magnesium hydroxide (MILK OF MAGNESIA) suspension 30 mL  30 mL Oral Daily PRN Ethelene Hal, NP      . meloxicam [facility]) tablet 15 mg  15 mg Oral  Daily Ethelene Hal, NP   15 mg at 01/27/18 0801  . montelukast (SINGULAIR) tablet 10 mg  10 mg Oral QHS Ethelene Hal, NP   10 mg at 01/26/18 2131  . traMADol (ULTRAM) tablet 50 mg  50 mg Oral Q12H PRN Cobos, Myer Peer, MD   50 mg at 01/27/18 0804  . traZODone (DESYREL) tablet 25 mg  25 mg Oral QHS PRN Cobos, Myer Peer, MD   25 mg at 01/26/18 2131    Lab Results:  Results for orders placed or performed during the hospital encounter of 01/25/18 (from the past 48 hour(s))  TSH     Status: None   Collection Time: 01/27/18  7:35 AM  Result Value Ref Range   TSH 1.616 0.350 - 4.500 uIU/mL    Comment: Performed by a 3rd Generation assay with a functional sensitivity of <=0.01 uIU/mL. Performed at [facility] [place] [address], [place], [place] [address]     Blood Alcohol level:  Lab Results  Component Value Date   ETH <10 50/53/9767    Metabolic Disorder Labs: Lab Results  Component Value Date   HGBA1C 5.6 03/04/2016   No results found for: PROLACTIN Lab Results  Component Value Date   CHOL 152 03/04/2016   TRIG 81  03/04/2016   HDL 37 (L) 03/04/2016   LDLCALC 99 03/04/2016   LDLCALC 71 12/20/2007    Physical Findings: AIMS: Facial and Oral Movements Muscles of Facial Expression: None, normal Lips and Perioral Area: None, normal Jaw: None, normal Tongue: None, normal,Extremity Movements Upper (arms, wrists, hands, fingers): None, normal Lower (legs, knees, ankles, toes): None, normal, Trunk Movements Neck, shoulders, hips: None, normal, Overall Severity Severity of abnormal movements (highest score from questions above): None, normal Incapacitation due to abnormal movements: None, normal Patient's awareness of abnormal movements (rate only patient's report): No Awareness, Dental Status Current problems with teeth and/or dentures?: No Does patient usually wear dentures?: No  CIWA:    COWS:     Musculoskeletal: Strength & Muscle Tone: within normal limits Gait & Station: normal Patient leans: N/A  Psychiatric Specialty Exam: Physical Exam  Constitutional: She is oriented to person, place, and time. She appears well-developed and well-nourished.  HENT:  Head: Normocephalic.  Neck: Normal range of motion.  Respiratory: Effort normal.  Musculoskeletal: Normal range of motion.  Neurological: She is alert and oriented to person, place, and time. She has normal strength. No cranial nerve deficit or sensory deficit. Coordination and gait normal.  Skin: Skin is warm and dry.  Psychiatric: Her speech is normal and behavior is normal. Her mood appears anxious. Her affect is labile. Cognition and memory are normal. She expresses impulsivity. She exhibits a depressed mood. She expresses suicidal ideation.    Review of Systems  Psychiatric/Behavioral: Positive for depression and suicidal ideas.  All other systems reviewed and are negative.   Blood pressure 117/68, pulse 98, temperature 98.3 F (36.8 C), temperature source Oral, resp. rate 16, height 5\' 7"  (1.702 m), weight 73.5 kg (162 lb).Body  mass index is 25.37 kg/m.  General Appearance: Fairly Groomed  Eye Contact:  Fair  Speech:  Normal Rate  Volume:  Normal  Mood:  Irritable, depressed, anxious  Affect:  Congruent  Thought Process:  Goal Directed  Orientation:  Full (Time, Place, and Person)  Thought Content:  Logical  Suicidal Thoughts:  No but will not contract for safety without a sitter  Homicidal Thoughts:  No  Memory:  Immediate;  Good Recent;   Good Remote;   Good  Judgement:  Poor  Insight:  Fair  Psychomotor Activity:  Normal  Concentration:  Concentration: Good and Attention Span: Good  Recall:  Good  Fund of Knowledge:  Good  Language:  Good  Akathisia:  No  Handed:  Right  AIMS (if indicated):     Assets:  Communication Skills Housing Physical Health Social Support  ADL's:  Intact  Cognition:  WNL  Sleep:  Number of Hours: 6.5   Problem Addressed: Bipolar affective disorder, depressed, severe without psychosis/  Treatment Plan Summary: 1. Continue individual and group therapy  2. Continue medical medications: Continue Tramadol 50 mg every 12 hours PRN pain Continue Mobic 15 mg daily for muscle spasms Continue Singulair 10 mg daily for allergies  3. Continue psychiatric medications: Continue Abilify 2 mg daily for depression and mood swings Continue Vistaril 25 mg TID PRN anxiety Stop Trazodone per patient request  Waylan Boga, NP 01/27/2018, 11:24 AM

## 2018-01-27 NOTE — Progress Notes (Addendum)
Recreation Therapy Notes  Date: 01/27/18 Time: 0930 Location: [address] Dayroom  Group Topic: Stress Management  Goal Area(s) Addresses:  Patient will verbalize importance of using healthy stress management.  Patient will identify positive emotions associated with healthy stress management.   Intervention: Stress Management  Activity :  Guided Imagery.  LRT introduced the stress management technique of guided imagery.  LRT read a script that guided patients to envision laying out in the sun on a warm day, enjoying the breeze and sunshine.  Patients were to listen and follow along as script was read.  Education:  Stress Management, Discharge Planning.   Education Outcome: Acknowledges edcuation/In group clarification offered/Needs additional education  Clinical Observations/Feedback: Pt did not attend group.    Victorino Sparrow, LRT/CTRS         Ria Comment, Marilyne Haseley A 01/27/2018 11:22 AM

## 2018-01-27 NOTE — Progress Notes (Signed)
1:1 Progress Note D: Pt currently in bed resting. Patient appropriate to situation. Pt in no current distress.  A: Sitter is currently at bedside. R: Pt remains safe on a 1:1 per MD orders.

## 2018-01-27 NOTE — Progress Notes (Signed)
Adult Psychoeducational Group Note  Date:  01/27/2018 Time:  10:43 PM  Group Topic/Focus:  Wrap-Up Group:   The focus of this group is to help patients review their daily goal of treatment and discuss progress on daily workbooks.  Participation Level:  Active  Participation Quality:  Appropriate  Affect:  Appropriate  Cognitive:  Alert  Insight: Appropriate  Engagement in Group:  Engaged  Modes of Intervention:  Discussion  Additional Comments:  Patient stated that she was grateful for the ones putting up with her. Patient's goal for today was to not hurt herself.   Dorean Daniello L Gerrianne Aydelott 01/27/2018, 10:43 PM

## 2018-01-27 NOTE — Progress Notes (Signed)
1:1 Progress Note D: Pt currently asleep in bed. Patient appropriate to situation. Pt in no current distress.  A: Sitter is currently at pts bedside. R: Pt remains safe on a 1:1 per MD orders.

## 2018-01-28 MED ORDER — ARIPIPRAZOLE 5 MG PO TABS
5.0000 mg | ORAL_TABLET | Freq: Every day | ORAL | Status: DC
  Administered 2018-01-28 – 2018-01-29 (×2): 5 mg via ORAL
  Filled 2018-01-28 (×5): qty 1

## 2018-01-28 MED ORDER — ASPIRIN-ACETAMINOPHEN-CAFFEINE 250-250-65 MG PO TABS
1.0000 | ORAL_TABLET | Freq: Once | ORAL | Status: AC
  Administered 2018-01-28: 1 via ORAL
  Filled 2018-01-28 (×2): qty 1

## 2018-01-28 MED ORDER — ZOLPIDEM TARTRATE 5 MG PO TABS
5.0000 mg | ORAL_TABLET | Freq: Every evening | ORAL | Status: DC | PRN
  Administered 2018-01-28 – 2018-01-29 (×2): 5 mg via ORAL
  Filled 2018-01-28 (×2): qty 1

## 2018-01-28 NOTE — Progress Notes (Signed)
[place] Post 1:1 Observation Documentation  For the first (8) hours following discontinuation of 1:1 precautions, a progress note entry by nursing staff should be documented at least every 2 hours, reflecting the patient's behavior, condition, mood, and conversation.  Use the progress notes for additional entries.  Time 1:1 discontinued:  9:15  Patient's Behavior:  Anxious. Cooperative. Attention seeking  Patient's Condition:  Pt verbally contracts for safety.   Patient's Conversation:  Loud. Attention seeking.  Cariana Karge L 01/28/2018, 11:21 AM

## 2018-01-28 NOTE — Progress Notes (Signed)
Adult Psychoeducational Group Note  Date:  01/28/2018 Time: 1600  Group Topic/Focus:  Crisis Planning:   The purpose of this group is to help patients create a crisis plan for use upon discharge or in the future, as needed.  Participation Level:  Active  Participation Quality:  Appropriate  Affect:  Appropriate  Cognitive:  Appropriate  Insight: Appropriate  Engagement in Group:  Engaged  Modes of Intervention:  Discussion and Education  Additional Comments:

## 2018-01-28 NOTE — Progress Notes (Signed)
[place] Post 1:1 Observation Documentation  For the first (8) hours following discontinuation of 1:1 precautions, a progress note entry by nursing staff should be documented at least every 2 hours, reflecting the patient's behavior, condition, mood, and conversation.  Use the progress notes for additional entries.  Time 1:1 discontinued:  0915  Patient's Behavior:  Pt attending group at this time. Pt appears to be calm and cooperative.  Patient's Condition:  Safety maintained.  Patient's Conversation:  Logical/coherent  Jewelia Bocchino L 01/28/2018, 1:29 PM

## 2018-01-28 NOTE — Progress Notes (Signed)
1:1 Progress Note D: Pt currently in the hallway. Patient appropriate to situation. Pt in no current distress.  A: Sitter is currently by patients side. R: Pt remains safe on a 1:1 per MD orders.

## 2018-01-28 NOTE — Progress Notes (Signed)
D/c 1:1 per Dr. Parke Poisson. Writer spoke with pt. Pt verbally contracts for safety and denies any self harm thoughts. Pt reoriented to the unit and made aware of staff members on the unit who she can speak with it she begins to have any self harm thoughts.

## 2018-01-28 NOTE — Progress Notes (Signed)
CSW spoke to pt regarding outpt provider plans.  Pt said she used to receive med mgmt at [facility] and has charity care there.  CSW called and was able to leave message with new pt coordinator, Luz Brazen, [phone]. Winferd Humphrey, MSW, LCSW Clinical Social Worker 01/28/2018 2:53 PM

## 2018-01-28 NOTE — Progress Notes (Signed)
[place] Post 1:1 Observation Documentation  For the first (8) hours following discontinuation of 1:1 precautions, a progress note entry by nursing staff should be documented at least every 2 hours, reflecting the patient's behavior, condition, mood, and conversation.  Use the progress notes for additional entries.  Time 1:1 discontinued:  9:15 am  Patient's Behavior:  Anxious. Sitting in the dayroom coloring. Cooperative. Antianxiety med offered. Pt declined.  Patient's Condition:  15 minute checks performed for safety.  Patient's Conversation:  Soft. Minimal.  Allissa Albright L 01/28/2018, 4:57 PM

## 2018-01-28 NOTE — BHH Group Notes (Signed)
[facility] LCSW Group Therapy Note  Date/Time: 01/28/18, 1315  Type of Therapy/Topic:  Group Therapy:  Balance in Life  Participation Level: moderate   Description of Group:    This group will address the concept of balance and how it feels and looks when one is unbalanced. Patients will be encouraged to process areas in their lives that are out of balance, and identify reasons for remaining unbalanced. Facilitators will guide patients utilizing problem- solving interventions to address and correct the stressor making their life unbalanced. Understanding and applying boundaries will be explored and addressed for obtaining  and maintaining a balanced life. Patients will be encouraged to explore ways to assertively make their unbalanced needs known to significant others in their lives, using other group members and facilitator for support and feedback.  Therapeutic Goals: 1. Patient will identify two or more emotions or situations they have that consume much of in their lives. 2. Patient will identify signs/triggers that life has become out of balance:  3. Patient will identify two ways to set boundaries in order to achieve balance in their lives:  4. Patient will demonstrate ability to communicate their needs through discussion and/or role plays  Summary of Patient Progress: Pt identified work and financial as areas of life that are out of balance.  Pt continues to be negative and responds to all ideas for possible ways to make improvements by saying it is not possible.            Therapeutic Modalities:   Cognitive Behavioral Therapy Solution-Focused Therapy Assertiveness Training  Lurline Idol, 

## 2018-01-28 NOTE — BHH Group Notes (Signed)
Adult Psychoeducational Group Note  Date:  01/28/2018 Time:  2:20 PM  Group Topic/Focus:  Goals Group:   The focus of this group is to help patients establish daily goals to achieve during treatment and discuss how the patient can incorporate goal setting into their daily lives to aide in recovery. Managing Feelings:   The focus of this group is to identify what feelings patients have difficulty handling and develop a plan to handle them in a healthier way upon discharge.  Participation Level:  Active  Participation Quality:  Appropriate and Attentive  Affect:  Appropriate  Cognitive:  Appropriate  Insight: Appropriate  Engagement in Group:  Engaged  Modes of Intervention:  Activity and Discussion  Additional Comments:  Pt attended the psychoeducational group this afternoon and was engaged throughout. Pt's goal today is to think of coping skills for regulating her mood. Pt also participated in the question ball activity.   Sandi Mariscal O 01/28/2018, 2:20 PM

## 2018-01-28 NOTE — Progress Notes (Signed)
1:1 Progress Note D: Pt currently asleep in bed. Patient appropriate to situation. Pt in no current distress.  A: Sitter is currently at bedside. R: Pt remains safe on a 1:1 per MD orders.   

## 2018-01-28 NOTE — Progress Notes (Signed)
[place] Post 1:1 Observation Documentation  For the first (8) hours following discontinuation of 1:1 precautions, a progress note entry by nursing staff should be documented at least every 2 hours, reflecting the patient's behavior, condition, mood, and conversation.  Use the progress notes for additional entries.  Time 1:1 discontinued:  9:15 am  Patient's Behavior:  Pt anxious. Pt cooperative at this time.  Patient's Condition:  15 minute checks performed for safety  Patient's Conversation:  Logical/coherent  Natale Barba L 01/28/2018, 4:56 PM

## 2018-01-28 NOTE — Progress Notes (Addendum)
[facility] MD Progress Note  01/28/2018 7:34 AM Frances [name]  MRN:  [id number] Subjective: patient acknowledges some improvement compared to how she felt prior to admission. She denies suicidal ideations, and presents future oriented. She is tolerating Abilify well thus far, but reports Trazodone has made her feel dizzy. States she received Ambien in ED and prior to admission and that it was better tolerated.  Objective: I have discussed case with treatment team and have met with patient  Patient is presenting with partially improved mood and range of affect today. She presents less anxious and less irritable. At this time denies any suicidal or self injurious ideations. She has been visible on unit, going to groups. Staff reports that she at times presents loud and attention seeking but no overtly disruptive or agitated behaviors . Patient has reported a history of prior psychiatric medication trials being poorly tolerated . Thus far is tolerating Abilify trial well . As above, reports poor tolerance to Trazodone, states Ambien better tolerated and effective in the past . Labs reviewed- TSH WNL   Principal Problem:  Bipolar affective disorder, depressed severe without psychosis  Diagnosis:   Patient Active Problem List   Diagnosis Date Noted  . Bipolar 1 disorder, depressed, severe ([place]) [F31.4] 01/25/2018  . Depression [F32.9]   . Anxiety [F41.9] 01/22/2018  . Borderline personality disorder ([place]) [F60.3] 01/22/2018  . Rheumatoid arthritis 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 ([place]) [M06.09] 12/09/2017  . 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 New daily persistent headache [G44.52] 09/26/2017  . MTHFR gene mutation ([place]) [E72.12] 09/20/2017  . Bipolar I disorder ([place]) [F31.9] 09/16/2017  . Fibromyalgia [M79.7] 09/16/2017  . Chronic fatigue syndrome [R53.82] 09/16/2017  . DYSPNEA [R06.02] 10/15/2011  . FEVER, RECURRENT [A68.9] 04/02/2009  .  DERMATITIS, ALLERGIC [L25.9] 06/05/2008  . FATIGUE [R53.81, R53.83] 05/05/2008  . HYPOTHYROIDISM [E03.9] 02/10/2008  . 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 FIBROMYALGIA [IMO0001] 02/10/2008  . IRON, SERUM, ELEVATED [R79.89] 02/10/2008   Total Time spent with patient: 20 minutes  Past Psychiatric History:    Past Medical History:  Past Medical History:  Diagnosis Date  . Anxiety   . Arthritis   . Back pain, chronic   . Bipolar 1 disorder ([place])   . Chronic fatigue syndrome   . DDD (degenerative disc disease), lumbar   . Depression   . Fibromyalgia   . IUD 2012   Mirena  . MTHFR gene mutation [facility])     Past Surgical History:  Procedure Laterality Date  . COLONOSCOPY WITH PROPOFOL N/A 02/25/2017   Procedure: COLONOSCOPY WITH PROPOFOL;  Surgeon: Arta Silence, MD;  Location: [facility] ENDOSCOPY;  Service: Endoscopy;  Laterality: N/A;  . left knee lateral release  1993  . scar tisue removal  03/2005   left ankle  . TONSILLECTOMY  age 67's   Family History:  Family History  Problem Relation Age of Onset  . Diabetes Mother   . Stroke Mother   . Lupus Mother   . Hypertension Mother   . Congestive Heart Failure Mother   . Mental illness Brother        Not clear what his diagnosis is--may be related to previous drug use.  . Cancer Maternal Grandmother        colon  . Depression Maternal Uncle   . Alcohol abuse Maternal Uncle   . Diabetes Maternal Grandfather    Social History:  Social History  Substance and Sexual Activity  Alcohol Use No  . Alcohol/week: 0.0 oz     Social History   Substance and Sexual Activity  Drug Use No    Social History   Socioeconomic History  . Marital status: Single    Spouse name: None  . Number of children: 0  . Years of education: None  . Highest education level: Bachelor's degree (e.g., BA, AB, BS)  Social Needs  . Financial resource strain: None  . Food  insecurity - worry: None  . Food insecurity - inability: None  . Transportation needs - medical: None  . Transportation needs - non-medical: None  Occupational History  . Occupation: Designer, industrial/product at times  Tobacco Use  . Smoking status: Never Smoker  . Smokeless tobacco: Never Used  Substance and Sexual Activity  . Alcohol use: No    Alcohol/week: 0.0 oz  . Drug use: No  . Sexual activity: Yes    Birth control/protection: IUD  Other Topics Concern  . None  Social History Narrative   Originally from [place], outside of [place].    Moved here permanently 2012.   Intermittently worked on a farm.   Lives with many cats--3 plus fosters cats.    Single, lives alone in a one story home. Rarely drinks caffeine. 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    Sleep: Fair  Appetite:  Good  Current Medications: Current Facility-Administered Medications  Medication Dose Route Frequency Provider Last Rate Last Dose  . acetaminophen (TYLENOL) tablet 650 mg  650 mg Oral Q4H PRN Ethelene Hal, NP   650 mg at 01/27/18 1645  . alum & mag hydroxide-simeth (MAALOX/MYLANTA) 200-200-20 MG/5ML suspension 30 mL  30 mL Oral Q4H PRN Ethelene Hal, NP      . ARIPiprazole (ABILIFY) tablet 5 mg  5 mg Oral Daily Shelden Raborn A, MD      . hydrOXYzine (ATARAX/VISTARIL) tablet 25 mg  25 mg Oral TID PRN Ethelene Hal, NP   25 mg at 01/27/18 2215  . magnesium hydroxide (MILK OF MAGNESIA) suspension 30 mL  30 mL Oral Daily PRN Ethelene Hal, NP      . meloxicam [facility]) tablet 15 mg  15 mg Oral Daily Ethelene Hal, NP   15 mg at 01/27/18 0801  . montelukast (SINGULAIR) tablet 10 mg  10 mg Oral QHS Ethelene Hal, NP   10 mg at 01/27/18 2213  . traMADol (ULTRAM) tablet 50 mg  50 mg Oral Q12H PRN Emmamae Mcnamara, Myer Peer, MD   50 mg at 01/27/18 0804  . zolpidem (AMBIEN) tablet 5 mg  5 mg Oral QHS PRN Olan Kurek, Myer Peer, MD        Lab Results:   Results for orders placed or performed during the hospital encounter of 01/25/18 (from the past 48 hour(s))  TSH     Status: None   Collection Time: 01/27/18  7:35 AM  Result Value Ref Range   TSH 1.616 0.350 - 4.500 uIU/mL    Comment: Performed by a 3rd Generation assay with a functional sensitivity of <=0.01 uIU/mL. Performed at [facility], [place] [address] [place], [place] [address]     Blood Alcohol level:  Lab Results  Component Value Date   ETH <10 19/14/7829    Metabolic Disorder Labs: Lab Results  Component Value Date   HGBA1C 5.6 03/04/2016   No results found for: PROLACTIN Lab Results  Component Value Date  CHOL 152 03/04/2016   TRIG 81 03/04/2016   HDL 37 (L) 03/04/2016   LDLCALC 99 03/04/2016   LDLCALC 71 12/20/2007    Physical Findings: AIMS: Facial and Oral Movements Muscles of Facial Expression: None, normal Lips and Perioral Area: None, normal Jaw: None, normal Tongue: None, normal,Extremity Movements Upper (arms, wrists, hands, fingers): None, normal Lower (legs, knees, ankles, toes): None, normal, Trunk Movements Neck, shoulders, hips: None, normal, Overall Severity Severity of abnormal movements (highest score from questions above): None, normal Incapacitation due to abnormal movements: None, normal Patient's awareness of abnormal movements (rate only patient's report): No Awareness, Dental Status Current problems with teeth and/or dentures?: No Does patient usually wear dentures?: No  CIWA:    COWS:     Musculoskeletal: Strength & Muscle Tone: within normal limits Gait & Station: normal Patient leans: N/A  Psychiatric Specialty Exam: Physical Exam  Constitutional: She is oriented to person, place, and time. She appears well-developed and well-nourished.  HENT:  Head: Normocephalic.  Neck: Normal range of motion.  Respiratory: Effort normal.  Musculoskeletal: Normal range of motion.  Neurological: She is alert and  oriented to person, place, and time. She has normal strength. No cranial nerve deficit or sensory deficit. Coordination and gait normal.  Skin: Skin is warm and dry.  Psychiatric: Her speech is normal and behavior is normal. Her mood appears anxious. Her affect is labile. Cognition and memory are normal. She expresses impulsivity. She exhibits a depressed mood. She expresses suicidal ideation.  reports headache, no chest pain , no shortness of breath, no vomiting   Review of Systems  Psychiatric/Behavioral: Positive for depression and suicidal ideas.  All other systems reviewed and are negative.   Blood pressure 117/68, pulse 98, temperature 98.3 F (36.8 C), temperature source Oral, resp. rate 16, height 5' 7"  (1.702 m), weight 73.5 kg (162 lb).Body mass index is 25.37 kg/m.  General Appearance: improving grooming   Eye Contact:  Good  Speech:  Normal Rate  Volume:  Normal  Mood:  acknowledges partial improvement , reports she feels better   Affect:  less irritable, more reactive   Thought Process:  Linear and Descriptions of Associations: Intact  Orientation:  Other:  fully alert and attentive  Thought Content:  no hallucinations, no delusions, not internally preoccupied   Suicidal Thoughts:  No today denies suicidal or self injurious ideations, contracts for safety and is future oriented   Homicidal Thoughts:  No denies homicidal or violent ideations  Memory:  recent and remote grossly intact   Judgement: fair- improving   Insight:  Fair  Psychomotor Activity:  Normal  Concentration:  Concentration: Good and Attention Span: Good  Recall:  Good  Fund of Knowledge:  Good  Language:  Good  Akathisia:  No  Handed:  Right  AIMS (if indicated):     Assets:  Communication Skills Housing Physical Health Social Support  ADL's:  Intact  Cognition:  WNL  Sleep:  Number of Hours: 5.75   Assessment : patient is presenting with improving mood and range of affect. Presents with a more  reactive, less irritable, less dysphoric affect. Denies any suicidal ideations or any self injurious ideations . She presents future oriented, and is expressing interest in disposition/outpatient treatment options following this admission. Thus far tolerating Abilify well, which has been started at low dose due to history of side effects on other psychiatric medications in the past. States Trazodone poorly tolerated, prefers Ambien.   Treatment Plan Summary: Treatment Plan reviewed  as below today 2/7  Continue individual and group therapy to work on coping skills and symptom reduction As discussed with Nursing Staff, will discontinue one to one observation based on clinical improvement and no current SI or self injurious ideations Increase Abilify to 5 mgrs QDAY for mood disorder  Discontinue Trazodone  Start Ambien 5 mgrs QHS PRN for insomnia as needed  Continue Tramadol 50 mg every 12 hours PRN pain Continue Mobic 15 mg daily for muscle spasms Continue Singulair 10 mg daily for allergies Treatment team working on disposition planning options    Jenne Campus, MD 01/28/2018, 7:34 AM   Patient ID: Frances [name], female   DOB: 22-Dec-1976, 42 y.o.   MRN: [id number]

## 2018-01-29 MED ORDER — VITAMIN B-12 3000 MCG SL SUBL
3000.0000 ug | SUBLINGUAL_TABLET | Freq: Every day | SUBLINGUAL | Status: DC
  Administered 2018-01-29 – 2018-02-01 (×4): 3000 ug via SUBLINGUAL

## 2018-01-29 MED ORDER — FOLIC ACID 800 MCG PO TABS
800.0000 ug | ORAL_TABLET | Freq: Every day | ORAL | Status: DC
  Administered 2018-01-29 – 2018-02-01 (×4): 800 ug via ORAL

## 2018-01-29 MED ORDER — ARIPIPRAZOLE 10 MG PO TABS
10.0000 mg | ORAL_TABLET | Freq: Every day | ORAL | Status: DC
  Administered 2018-01-30 – 2018-02-01 (×3): 10 mg via ORAL
  Filled 2018-01-29 (×4): qty 1

## 2018-01-29 NOTE — BHH Group Notes (Signed)
  [facility] LCSW Group Therapy Note  Date/Time: 01/29/18, 1315  Type of Therapy/Topic:  Group Therapy:  Emotion Regulation  Participation Level:  Active   Mood:pleasant  Description of Group:    The purpose of this group is to assist patients in learning to regulate negative emotions and experience positive emotions. Patients will be guided to discuss ways in which they have been vulnerable to their negative emotions. These vulnerabilities will be juxtaposed with experiences of positive emotions or situations, and patients challenged to use positive emotions to combat negative ones. Special emphasis will be placed on coping with negative emotions in conflict situations, and patients will process healthy conflict resolution skills.  Therapeutic Goals: 1. Patient will identify two positive emotions or experiences to reflect on in order to balance out negative emotions:  2. Patient will label two or more emotions that they find the most difficult to experience:  3. Patient will be able to demonstrate positive conflict resolution skills through discussion or role plays:   Summary of Patient Progress:Pt was very appropriately engaged in group today.  Positive participation where she took responsibility for her difficulties in controlling her anger.  Pt acknowledged that she has not yet found a way to manage her difficult emotions that works for her.       Therapeutic Modalities:   Cognitive Behavioral Therapy Feelings Identification Dialectical Behavioral Therapy  Lurline Idol, LCSW

## 2018-01-29 NOTE — Progress Notes (Signed)
D Patient is seen in treatment team today. She describes her self as having being " given up on " and somebody that has had " a hard time  most of my life". A She  denied SI today and she rated her depression, hopelessness and anxeity " 3/2/5", respectively. completed her daily assessment and on this s he wrote  She denied active SI today. Writer and pt sat and spoke about pt's feelings- that she is aware she needs to learn how to modulate how she acts. At times, she argues about the futility of her problems..." it'll never get better and nothing you do can do will fix me".Marland KitchenMarland KitchenWriter processed with pt about what she wants to work and and she says " my anger..I  need to learn how to handle it". R Pt will cont to process her anger in preperatin for group tomorrow.

## 2018-01-29 NOTE — Progress Notes (Signed)
Nursing Progress Note: 7p-7a D: Pt currently presents with a depressed/sad/anxious affect and behavior. Pt states "I am not happy. I just feel like crying all the time, and I don't even know why." Interacting minimally with the milieu. Pt reports off and on sleep during the previous night with current medication regimen. Pt did attend wrap-up group.  A: Pt provided with medications per providers orders. Pt's labs and vitals were monitored throughout the night. Pt supported emotionally and encouraged to express concerns and questions. Pt educated on medications.  R: Pt's safety ensured with 15 minute and environmental checks. Pt currently denies SI, HI, and AVH. Pt verbally contracts to seek staff if SI,HI, or AVH occurs and to consult with staff before acting on any harmful thoughts. Will continue to monitor.

## 2018-01-29 NOTE — Progress Notes (Signed)
Pt is angry and irritable at med pass.  Pt sts her abilify was to be increased.  "Its Friday, how is the doctor going to discharge me Sunday?  I know they don't come in.  He was supposed to increase my med but he didn't.  How am I supposed to sleep with all the noise you make and a low dose of meds to help me sleep?  I can hear when you close a binder."   Pt given meds and offered support and encouragement. Pt remains safe on unit with q15 min checks.

## 2018-01-29 NOTE — BHH Group Notes (Signed)
[place] Group Notes:  (Nursing/MHT/Case Management/Adjunct)  Date:  01/29/2018  Time:  1:10 PM  Type of Therapy:  Group Therapy  Participation Level:  Did Not Attend   Summary of Progress/Problems:  Nurse educated patients to the uses of essential oils in the supplemental treatment of multiple medical issues.   Cheri Kearns 01/29/2018, 1:10 PM

## 2018-01-29 NOTE — Progress Notes (Addendum)
[facility] MD Progress Note  01/29/2018 2:17 PM Frances [name]  MRN:  [id number] Subjective: patient reports ongoing depression, mood lability, vague irritability .Currently tends to focus on complaints about food , bed, ambient noise on unit. Denies suicidal ideations at this time . Denies medication side effects at this time and thus far has tolerated Abilify trial well . Reports disrupted sleep in spite of Ambien, which she attributes in part to environmental issues , noises at night time .  Objective: I have discussed case with treatment team and have met with patient  I also met with patient along with RN and CSW. Patient reports ongoing symptoms such as mood lability, irritability.  She denies suicidal plans or intentions and at this time she contracts for safety on unit.  She denies current  medication side effects- reports she has history of poor tolerance and adverse effects to different psychiatric medications in the past , but thus far has tolerated Abilify trial well, and is in agreement with further dose titration.  Patient presents with a more reactive affect, and during session affect tended to improve with staff support and encouragement. She expressed expressed concern that it has been difficult for her to establish a good /lasting therapeutic alliance with outpatient psychiatrists/mental health providers. She describes insight regarding irritability and short term mood swings and states " I have been like this all my life " As per CSW patient has been terminated/ fired from [employer] and from [employer]. At this time she expresses interest in following at [facility] outpatient psychiatric services and referral is in process .  No disruptive or agitated behaviors on unit, going to some groups    Principal Problem:  Bipolar affective disorder, depressed severe without psychosis  Diagnosis:   Patient Active Problem List   Diagnosis Date Noted  .  Bipolar 1 disorder, depressed, severe ([place]) [F31.4] 01/25/2018  . Depression [F32.9]   . Anxiety [F41.9] 01/22/2018  . Borderline personality disorder ([place]) [F60.3] 01/22/2018  . Rheumatoid arthritis 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 ([place]) [M06.09] 12/09/2017  . 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 New daily persistent headache [G44.52] 09/26/2017  . MTHFR gene mutation ([place]) [E72.12] 09/20/2017  . Bipolar I disorder ([place]) [F31.9] 09/16/2017  . Fibromyalgia [M79.7] 09/16/2017  . Chronic fatigue syndrome [R53.82] 09/16/2017  . DYSPNEA [R06.02] 10/15/2011  . FEVER, RECURRENT [A68.9] 04/02/2009  . DERMATITIS, ALLERGIC [L25.9] 06/05/2008  . FATIGUE [R53.81, R53.83] 05/05/2008  . HYPOTHYROIDISM [E03.9] 02/10/2008  . 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 FIBROMYALGIA [IMO0001] 02/10/2008  . IRON, SERUM, ELEVATED [R79.89] 02/10/2008   Total Time spent with patient: 20 minutes  Past Psychiatric History:    Past Medical History:  Past Medical History:  Diagnosis Date  . Anxiety   . Arthritis   . Back pain, chronic   . Bipolar 1 disorder ([place])   . Chronic fatigue syndrome   . DDD (degenerative disc disease), lumbar   . Depression   . Fibromyalgia   . IUD 2012   Mirena  . MTHFR gene mutation [facility])     Past Surgical History:  Procedure Laterality Date  . COLONOSCOPY WITH PROPOFOL N/A 02/25/2017   Procedure: COLONOSCOPY WITH PROPOFOL;  Surgeon: Arta Silence, MD;  Location: [facility] ENDOSCOPY;  Service: Endoscopy;  Laterality: N/A;  . left knee lateral release  1993  . scar tisue removal  03/2005   left ankle  .  TONSILLECTOMY  age 63's   Family History:  Family History  Problem Relation Age of Onset  . Diabetes Mother   . Stroke Mother   . Lupus Mother   . Hypertension Mother   . Congestive Heart Failure Mother   . Mental illness Brother        Not clear what his  diagnosis is--may be related to previous drug use.  . Cancer Maternal Grandmother        colon  . Depression Maternal Uncle   . Alcohol abuse Maternal Uncle   . Diabetes Maternal Grandfather    Social History:  Social History   Substance and Sexual Activity  Alcohol Use No  . Alcohol/week: 0.0 oz     Social History   Substance and Sexual Activity  Drug Use No    Social History   Socioeconomic History  . Marital status: Single    Spouse name: None  . Number of children: 0  . Years of education: None  . Highest education level: Bachelor's degree (e.g., BA, AB, BS)  Social Needs  . Financial resource strain: None  . Food insecurity - worry: None  . Food insecurity - inability: None  . Transportation needs - medical: None  . Transportation needs - non-medical: None  Occupational History  . Occupation: Designer, industrial/product at times  Tobacco Use  . Smoking status: Never Smoker  . Smokeless tobacco: Never Used  Substance and Sexual Activity  . Alcohol use: No    Alcohol/week: 0.0 oz  . Drug use: No  . Sexual activity: Yes    Birth control/protection: IUD  Other Topics Concern  . None  Social History Narrative   Originally from [place], outside of [place].    Moved here permanently 2012.   Intermittently worked on a farm.   Lives with many cats--3 plus fosters cats.    Single, lives alone in a one story home. Rarely drinks caffeine. 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    Sleep: Fair  Appetite:  Good  Current Medications: Current Facility-Administered Medications  Medication Dose Route Frequency Provider Last Rate Last Dose  . acetaminophen (TYLENOL) tablet 650 mg  650 mg Oral Q4H PRN Ethelene Hal, NP   650 mg at 01/27/18 1645  . alum & mag hydroxide-simeth (MAALOX/MYLANTA) 200-200-20 MG/5ML suspension 30 mL  30 mL Oral Q4H PRN Ethelene Hal, NP      . ARIPiprazole (ABILIFY) tablet 5 mg  5 mg Oral Daily Emojean Gertz, Myer Peer,  MD   5 mg at 01/29/18 0851  . folic acid (FOLVITE) tablet 800 mcg  800 mcg Oral Daily Leilanni Halvorson A, MD      . hydrOXYzine (ATARAX/VISTARIL) tablet 25 mg  25 mg Oral TID PRN Ethelene Hal, NP   25 mg at 01/29/18 4098  . magnesium hydroxide (MILK OF MAGNESIA) suspension 30 mL  30 mL Oral Daily PRN Ethelene Hal, NP      . meloxicam [facility]) tablet 15 mg  15 mg Oral Daily Ethelene Hal, NP   15 mg at 01/29/18 1191  . montelukast (SINGULAIR) tablet 10 mg  10 mg Oral QHS Ethelene Hal, NP   10 mg at 01/28/18 2202  . traMADol (ULTRAM) tablet 50 mg  50 mg Oral Q12H PRN Thoams Siefert, Myer Peer, MD   50 mg at 01/29/18 1351  . Vitamin B-12 SUBL 3,000 mcg  3,000 mcg Sublingual Daily Taylin Leder, Myer Peer, MD      .  zolpidem (AMBIEN) tablet 5 mg  5 mg Oral QHS PRN Anjali Manzella, Myer Peer, MD   5 mg at 01/28/18 2202    Lab Results:  No results found for this or any previous visit (from the past 48 hour(s)).  Blood Alcohol level:  Lab Results  Component Value Date   ETH <10 87/56/4332    Metabolic Disorder Labs: Lab Results  Component Value Date   HGBA1C 5.6 03/04/2016   No results found for: PROLACTIN Lab Results  Component Value Date   CHOL 152 03/04/2016   TRIG 81 03/04/2016   HDL 37 (L) 03/04/2016   LDLCALC 99 03/04/2016   LDLCALC 71 12/20/2007    Physical Findings: AIMS: Facial and Oral Movements Muscles of Facial Expression: None, normal Lips and Perioral Area: None, normal Jaw: None, normal Tongue: None, normal,Extremity Movements Upper (arms, wrists, hands, fingers): None, normal Lower (legs, knees, ankles, toes): None, normal, Trunk Movements Neck, shoulders, hips: None, normal, Overall Severity Severity of abnormal movements (highest score from questions above): None, normal Incapacitation due to abnormal movements: None, normal Patient's awareness of abnormal movements (rate only patient's report): No Awareness, Dental Status Current problems with teeth  and/or dentures?: No Does patient usually wear dentures?: No  CIWA:    COWS:     Musculoskeletal: Strength & Muscle Tone: within normal limits Gait & Station: normal Patient leans: N/A  Psychiatric Specialty Exam: Physical Exam  Constitutional: She is oriented to person, place, and time. She appears well-developed and well-nourished.  HENT:  Head: Normocephalic.  Neck: Normal range of motion.  Respiratory: Effort normal.  Musculoskeletal: Normal range of motion.  Neurological: She is alert and oriented to person, place, and time. She has normal strength. No cranial nerve deficit or sensory deficit. Coordination and gait normal.  Skin: Skin is warm and dry.  Psychiatric: Her speech is normal and behavior is normal. Her mood appears anxious. Her affect is labile. Cognition and memory are normal. She expresses impulsivity. She exhibits a depressed mood. She expresses suicidal ideation.  no chest pain , no shortness of breath, no vomiting , no fever   Review of Systems  Psychiatric/Behavioral: Positive for depression and suicidal ideas.  All other systems reviewed and are negative.   Blood pressure 121/80, pulse 99, temperature 98.7 F (37.1 C), resp. rate 16, height 5' 7"  (1.702 m), weight 73.5 kg (162 lb).Body mass index is 25.37 kg/m.  General Appearance: Fairly Groomed  Eye Contact:  Good  Speech:  Normal Rate  Volume:  Normal  Mood:  variable, reports she remains depressed, irritable   Affect:  remains labile, irritable , but more reactive and responsive to support   Thought Process:  Linear and Descriptions of Associations: Intact  Orientation:  Other:  fully alert and attentive  Thought Content:  no hallucinations, no delusions, not internally preoccupied   Suicidal Thoughts:  No currently denies suicidal ideations, denies self injurious ideations at this time  Homicidal Thoughts:  No denies homicidal or violent ideations  Memory:  recent and remote grossly intact    Judgement: fair- improving   Insight:  Fair- improving   Psychomotor Activity:  Normal  Concentration:  Concentration: Good and Attention Span: Good  Recall:  Good  Fund of Knowledge:  Good  Language:  Good  Akathisia:  No  Handed:  Right  AIMS (if indicated):     Assets:  Communication Skills Housing Physical Health Social Support  ADL's:  Intact  Cognition:  WNL  Sleep:  Number of  Hours: 6.5   Assessment : patient reports ongoing depression, mood lability, and a subjective sense of irritability. Although irritable and focused on negatives ( such as concerns about food, bed, ambient noise on unit) affect does seem more reactive with staff support and redirection. She denies SI at this time, and is future oriented, expressing interest in being referred to [facility] outpatient psychiatric services. Reports history of poor tolerance or response to prior psychiatric medications, but thus far tolerating Abilify well. Reports poor sleep  Treatment Plan Summary: Treatment Plan reviewed as below today 2/8 Continue individual and group therapy to work on coping skills and symptom reduction Increase Abilify to 10  mgrs QDAY for mood disorder   Continue  Ambien 5  mgrs QHS PRN for insomnia as needed  Continue Vistaril 25 mgrs Q 8 hours PRN for anxiety Continue Tramadol 50 mg every 12 hours PRN pain Continue Mobic 15 mg daily for muscle spasms Continue Singulair 10 mg daily for allergies Treatment team working on disposition planning options - see above    Jenne Campus, MD 01/29/2018, 2:17 PM   Patient ID: Frances [name], female   DOB: 01-15-1976, 42 y.o.   MRN: [id number]

## 2018-01-29 NOTE — Progress Notes (Signed)
Recreation Therapy Notes  Date: 01/29/18 Time: 0930 Location: [address] Dayroom  Group Topic: Stress Management  Goal Area(s) Addresses:  Patient will verbalize importance of using healthy stress management.  Patient will identify positive emotions associated with healthy stress management.   Intervention: Stress Management  Activity :  Progressive Muscle Relaxation.  LRT introduced the stress management technique of progressive muscle relaxation.  Patients were to follow along as LRT read script to tense each muscle group and then relax them separately.   Education:  Stress Management, Discharge Planning.   Education Outcome: Acknowledges edcuation/In group clarification offered/Needs additional education  Clinical Observations/Feedback: Pt did not attend group.     Victorino Sparrow, LRT/CTRS        Victorino Sparrow A 01/29/2018 12:10 PM

## 2018-01-30 MED ORDER — ZOLPIDEM TARTRATE 10 MG PO TABS
10.0000 mg | ORAL_TABLET | Freq: Every evening | ORAL | Status: DC | PRN
  Administered 2018-01-30: 10 mg via ORAL
  Filled 2018-01-30: qty 1

## 2018-01-30 MED ORDER — ALBUTEROL SULFATE HFA 108 (90 BASE) MCG/ACT IN AERS: 2.0000 | INHALATION_SPRAY | RESPIRATORY_TRACT | Status: DC | PRN

## 2018-01-30 NOTE — [facility] Group Notes (Signed)
LCSW Group Therapy Note  01/30/2018 9:30-10:30AM - [address], 10:30-11:30 - [address], 11:30-12:00 - 500 [address]  Type of Therapy and Topic:  Group Therapy: Anger Cues and Responses  Participation Level:  Active   Description of Group:   In this group, patients learned how to recognize the physical, cognitive, emotional, and behavioral responses they have to anger-provoking situations.  They identified a recent time they became angry and how they reacted.  They analyzed how their reaction was possibly beneficial and how it was possibly unhelpful.  The group discussed a variety of healthier coping skills that could help with such a situation in the future.  Deep breathing was practiced briefly.  Therapeutic Goals: 1. Patients will remember their last incident of anger and how they felt emotionally and physically, what their thoughts were at the time, and how they behaved. 2. Patients will identify how their behavior at that time worked for them, as well as how it worked against them. 3. Patients will explore possible new behaviors to use in future anger situations. 4. Patients will learn that anger itself is normal and cannot be eliminated, and that healthier reactions can assist with resolving conflict rather than worsening situations.  Summary of Patient Progress:  The patient shared that their most recent time of anger was yesterday morning when she woke up in a bad mood and was able to acknowledge that was probably more of a mood disorder moment than an actual event-related anger.  She talked extensively about things she typically does, how she feels, what she thinks, and more.  She was insightful about her own tendencies without actually making a commitment to change.  Therapeutic Modalities:   Cognitive Behavioral Therapy  Frances [name]  01/30/2018 8:43 AM

## 2018-01-30 NOTE — [facility] Group Notes (Signed)
[facility] Group Notes:  (Nursing/MHT/Case Management/Adjunct)  Date:  01/30/2018  Time:  4:20 PM  Type of Therapy:  Nurse Education  /  The group focuses on teaching patients how to identify their needs and then hwo to develop the skills needed to get them met.  Participation Level:  Active  Participation Quality:  Attentive  Affect:  Appropriate  Cognitive:  Appropriate  Insight:  Good  Engagement in Group:  Engaged  Modes of Intervention:  Education  Summary of Progress/Problems:  , Frances [name] 01/30/2018, 4:20 PM 

## 2018-01-30 NOTE — Progress Notes (Signed)
[facility] MD Progress Note  01/30/2018 7:57 AM Frances [name]  MRN:  [id number] Subjective: patient reports ongoing subjective sense of frustration and irritability. Reports poor sleep, and states " I only slept about an hour last night". Focuses on different aspects of her current environment . States bed is uncomfortable and that there is too much ambient noise at nighttime. Also states food is low in quality. She reports nausea, but no vomiting , today, which she states may be related to food or to Abilify trial, but states she is willing to continue Abilify trial at this time.   Objective: I have reviewed chart notes  and have met with patient  Patient remains irritable, but to a lesser degree, and today, although still focusing on negative issues as above, presents with a more reactive affect . Denies suicidal ideations at this time. Thus far tolerating Abilify thus far, states she has been feeling vaguely nauseous today, but denies vomiting . She is visible on unit and has been going to groups, presenting attentive and engaged . Reports insomnia as significant /ongoing issue  in spite of Ambien PRN.    Principal Problem:  Bipolar affective disorder, depressed severe without psychosis  Diagnosis:   Patient Active Problem List   Diagnosis Date Noted  . Bipolar 1 disorder, depressed, severe ([place]) [F31.4] 01/25/2018  . Depression [F32.9]   . Anxiety [F41.9] 01/22/2018  . Borderline personality disorder ([place]) [F60.3] 01/22/2018  . Rheumatoid arthritis 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 ([place]) [M06.09] 12/09/2017  . 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 New daily persistent headache [G44.52] 09/26/2017  . MTHFR gene mutation ([place]) [E72.12] 09/20/2017  . Bipolar I disorder ([place]) [F31.9] 09/16/2017  . Fibromyalgia [M79.7] 09/16/2017  . Chronic fatigue syndrome [R53.82] 09/16/2017  . DYSPNEA [R06.02] 10/15/2011  . FEVER, RECURRENT [A68.9] 04/02/2009   . DERMATITIS, ALLERGIC [L25.9] 06/05/2008  . FATIGUE [R53.81, R53.83] 05/05/2008  . HYPOTHYROIDISM [E03.9] 02/10/2008  . 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 FIBROMYALGIA [IMO0001] 02/10/2008  . IRON, SERUM, ELEVATED [R79.89] 02/10/2008   Total Time spent with patient: 20 minutes  Past Psychiatric History:    Past Medical History:  Past Medical History:  Diagnosis Date  . Anxiety   . Arthritis   . Back pain, chronic   . Bipolar 1 disorder ([place])   . Chronic fatigue syndrome   . DDD (degenerative disc disease), lumbar   . Depression   . Fibromyalgia   . IUD 2012   Mirena  . MTHFR gene mutation [facility])     Past Surgical History:  Procedure Laterality Date  . COLONOSCOPY WITH PROPOFOL N/A 02/25/2017   Procedure: COLONOSCOPY WITH PROPOFOL;  Surgeon: Arta Silence, MD;  Location: [facility];  Service: Endoscopy;  Laterality: N/A;  . left knee lateral release  1993  . scar tisue removal  03/2005   left ankle  . TONSILLECTOMY  age 65's   Family History:  Family History  Problem Relation Age of Onset  . Diabetes Mother   . Stroke Mother   . Lupus Mother   . Hypertension Mother   . Congestive Heart Failure Mother   . Mental illness Brother        Not clear what his diagnosis is--may be related to previous drug use.  . Cancer Maternal Grandmother        colon  . Depression Maternal Uncle   . Alcohol abuse Maternal Uncle   .  Diabetes Maternal Grandfather    Social History:  Social History   Substance and Sexual Activity  Alcohol Use No  . Alcohol/week: 0.0 oz     Social History   Substance and Sexual Activity  Drug Use No    Social History   Socioeconomic History  . Marital status: Single    Spouse name: None  . Number of children: 0  . Years of education: None  . Highest education level: Bachelor's degree (e.g., BA, AB, BS)  Social Needs  . Financial resource strain: None  . Food  insecurity - worry: None  . Food insecurity - inability: None  . Transportation needs - medical: None  . Transportation needs - non-medical: None  Occupational History  . Occupation: Designer, industrial/product at times  Tobacco Use  . Smoking status: Never Smoker  . Smokeless tobacco: Never Used  Substance and Sexual Activity  . Alcohol use: No    Alcohol/week: 0.0 oz  . Drug use: No  . Sexual activity: Yes    Birth control/protection: IUD  Other Topics Concern  . None  Social History Narrative   Originally from [place], outside of [place].    Moved here permanently 2012.   Intermittently worked on a farm.   Lives with many cats--3 plus fosters cats.    Single, lives alone in a one story home. Rarely drinks caffeine. 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    Sleep: Fair  Appetite:  Good  Current Medications: Current Facility-Administered Medications  Medication Dose Route Frequency Provider Last Rate Last Dose  . acetaminophen (TYLENOL) tablet 650 mg  650 mg Oral Q4H PRN Ethelene Hal, NP   650 mg at 01/27/18 1645  . alum & mag hydroxide-simeth (MAALOX/MYLANTA) 200-200-20 MG/5ML suspension 30 mL  30 mL Oral Q4H PRN Ethelene Hal, NP      . ARIPiprazole (ABILIFY) tablet 10 mg  10 mg Oral Daily Cobos, Myer Peer, MD      . folic acid (FOLVITE) tablet 800 mcg  800 mcg Oral Daily Cobos, Myer Peer, MD   800 mcg at 01/29/18 1452  . hydrOXYzine (ATARAX/VISTARIL) tablet 25 mg  25 mg Oral TID PRN Ethelene Hal, NP   25 mg at 01/30/18 0019  . magnesium hydroxide (MILK OF MAGNESIA) suspension 30 mL  30 mL Oral Daily PRN Ethelene Hal, NP      . meloxicam [facility]) tablet 15 mg  15 mg Oral Daily Ethelene Hal, NP   15 mg at 01/29/18 5809  . montelukast (SINGULAIR) tablet 10 mg  10 mg Oral QHS Ethelene Hal, NP   10 mg at 01/29/18 2142  . traMADol (ULTRAM) tablet 50 mg  50 mg Oral Q12H PRN Cobos, Myer Peer, MD   50 mg at  01/29/18 1351  . Vitamin B-12 SUBL 3,000 mcg  3,000 mcg Sublingual Daily Cobos, Fernando A, MD   3,000 mcg at 01/29/18 1453  . zolpidem (AMBIEN) tablet 5 mg  5 mg Oral QHS PRN Cobos, Myer Peer, MD   5 mg at 01/29/18 2145    Lab Results:  No results found for this or any previous visit (from the past 48 hour(s)).  Blood Alcohol level:  Lab Results  Component Value Date   ETH <10 98/33/8250    Metabolic Disorder Labs: Lab Results  Component Value Date   HGBA1C 5.6 03/04/2016   No results found for: PROLACTIN Lab Results  Component Value Date   CHOL  152 03/04/2016   TRIG 81 03/04/2016   HDL 37 (L) 03/04/2016   LDLCALC 99 03/04/2016   LDLCALC 71 12/20/2007    Physical Findings: AIMS: Facial and Oral Movements Muscles of Facial Expression: None, normal Lips and Perioral Area: None, normal Jaw: None, normal Tongue: None, normal,Extremity Movements Upper (arms, wrists, hands, fingers): None, normal Lower (legs, knees, ankles, toes): None, normal, Trunk Movements Neck, shoulders, hips: None, normal, Overall Severity Severity of abnormal movements (highest score from questions above): None, normal Incapacitation due to abnormal movements: None, normal Patient's awareness of abnormal movements (rate only patient's report): No Awareness, Dental Status Current problems with teeth and/or dentures?: No Does patient usually wear dentures?: No  CIWA:    COWS:     Musculoskeletal: Strength & Muscle Tone: within normal limits Gait & Station: normal Patient leans: N/A  Psychiatric Specialty Exam: Physical Exam     Review of Systems  Psychiatric/Behavioral: Positive for depression and suicidal ideas.  All other systems reviewed and are negative. denies chest pain, no shortness of breath, (+) nausea, no vomiting, no fever   Blood pressure 121/80, pulse 99, temperature 98.7 F (37.1 C), resp. rate 16, height 5' 7"  (1.702 m), weight 73.5 kg (162 lb).Body mass index is 25.37  kg/m.  General Appearance: improving grooming   Eye Contact:  Good  Speech:  Normal Rate  Volume:  Normal  Mood:  reports ongoing anger, feeling irritable.   Affect:  less significantly irritable today, and affect seems to be more reactive than on admission  Thought Process:  Goal Directed and Descriptions of Associations: Intact  Orientation:  Full (Time, Place, and Person)  Thought Content:  no hallucinations, no delusions, not internally preoccupied   Suicidal Thoughts:  No  Denies suicidal plans or intentions, contracts for safety on unit at present  Homicidal Thoughts:  No denies homicidal or violent ideations  Memory:  recent and remote grossly intact   Judgement: fair- improving   Insight:  Fair- improving   Psychomotor Activity:  Normal- no psychomotor agitation   Concentration:  Concentration: Good and Attention Span: Good  Recall:  Good  Fund of Knowledge:  Good  Language:  Good  Akathisia:  No  Handed:  Right  AIMS (if indicated):     Assets:  Communication Skills Housing Physical Health Social Support  ADL's:  Intact  Cognition:  WNL  Sleep:  Number of Hours: 5   Assessment : patient remains vaguely irritable, focusing on perceived negative aspects of unit/environment, but overall presents with a more reactive affect and less significant dysphoria and irritability. She presents more responsive to support, and seemed to become calmer as session progressed. Today more future oriented, and stated she hopes she could be discharged soon. Has tolerated Abilify titration to 10 mgrs QDAY well thus far, although today reports feeling nauseous. Denies other medication side effects. Her major complaint at this time is insomnia, which she states has not responded to Ambien at 5 mgrs QHS.  Treatment Plan Summary: Treatment Plan reviewed as below today 2/9 Continue individual and group therapy to work on coping skills and symptom reduction Continue  Abilify  10  mgrs QDAY for mood  disorder   Increase   Ambien to 10   mgrs QHS PRN for insomnia as needed  Continue Vistaril 25 mgrs Q 8 hours PRN for anxiety Continue Tramadol 50 mg every 12 hours PRN pain Continue Mobic 15 mg daily for muscle spasms Continue Singulair 10 mg daily for allergies Treatment  team working on disposition planning options - see above    Jenne Campus, MD 01/30/2018, 7:57 AM   Patient ID: Frances [name], female   DOB: 12/18/1976, 42 y.o.   MRN: [id number]

## 2018-01-30 NOTE — BHH Group Notes (Signed)
Orientation / Goals  Date:  01/30/2018  Time:  3:44 PM  Type of Therapy:  Nurse Education  /  The group focuses on teaching patients who the staff is, what the staff responsibilities are and explaining unit programming and schedule. SMART goal-setting is explained and practiced also.  Participation Level:  Active  Participation Quality:  Attentive  Affect:  Appropriate  Cognitive:  Alert  Insight:  Appropriate  Engagement in Group:  Engaged  Modes of Intervention:  Education  Summary of Progress/Problems:  [name], [name] 01/30/2018, 3:44 PM

## 2018-01-30 NOTE — Progress Notes (Signed)
D Patient is observed OOB UAL on the 400 hall tolerated well. She is calm. She makes good eye contact. SHe is relaxed and says " I slept so much better last night". A She completed her daily assessment and on this she wrote  She deneid SI and she rated her depression, hopelessness and anxiety  " 5/4/3", respectively. She says " I don't know what's different today....some days I just wake up agitatedmandirritable and today is not one of them...". R Safety is in place. Writer to cont to offer pos reinforcement an feedback if / when pt demonstrates healthy behavior. R Safety in palce

## 2018-01-31 DIAGNOSIS — Z79899 Other long term (current) drug therapy: Secondary | ICD-10-CM

## 2018-01-31 MED ORDER — DM-GUAIFENESIN ER 30-600 MG PO TB12
1.0000 | ORAL_TABLET | Freq: Two times a day (BID) | ORAL | Status: DC
  Administered 2018-01-31 – 2018-02-01 (×2): 1 via ORAL
  Filled 2018-01-31 (×6): qty 1

## 2018-01-31 MED ORDER — TIZANIDINE HCL 2 MG PO TABS
4.0000 mg | ORAL_TABLET | Freq: Four times a day (QID) | ORAL | Status: DC | PRN
  Administered 2018-01-31 – 2018-02-01 (×2): 4 mg via ORAL
  Filled 2018-01-31 (×3): qty 2

## 2018-01-31 NOTE — BHH Group Notes (Signed)
[place] LCSW Group Therapy Note  Date/Time:  01/31/2018 10:00-11:00AM  Type of Therapy and Topic:  Group Therapy:  Healthy and Unhealthy Supports  Participation Level:  Minimal   Description of Group:  Patients in this group were introduced to the idea of adding a variety of healthy supports to address the various needs in their lives.Patients discussed what additional healthy supports could be helpful in their recovery and wellness after discharge in order to prevent future hospitalizations.   An emphasis was placed on using counselor, doctor, therapy groups, 12-step groups, and problem-specific support groups to expand supports.  They also worked as a group on developing a specific plan for several patients to deal with unhealthy supports through [place], psychoeducation with loved ones, and even termination of relationships.   Therapeutic Goals:   1)  discuss importance of adding supports to stay well once out of the hospital  2)  compare healthy versus unhealthy supports and identify some examples of each  3)  generate ideas and descriptions of healthy supports that can be added  4)  offer mutual support about how to address unhealthy supports  5)  encourage active participation in and adherence to discharge plan    Summary of Patient Progress:  The patient expressed a willingness to add a therapist and psychiatrist who won't give up on her like others have done to help in her recovery journey.  She appeared disgruntled during much of group and left abruptly at one point,did not return.   Therapeutic Modalities:   Motivational Interviewing Brief Solution-Focused Therapy  Selmer Dominion, LCSW

## 2018-01-31 NOTE — Progress Notes (Signed)
[facility] MD Progress Note  01/31/2018 1:21 PM Frances [name]  MRN:  [id number]   Subjective:  Patient reports that she is not doing well today. She reports chest pain, but is reproducible by pushing on her sternum with a fingertip. She also reports that the change in medication did not help either, as she only slept 3 hours. She reports that Tizanidine helps her at home. "My orthopedic doctor prescribes it for my pain and it helps me with sleep."   Objective: Patient's chart and findings reviewed and discussed with treatment team. Patient is seen in the milieu walking the hallways, in the day room, or in her room. She has multiple somatic complaints and it appears most were dealt with by previous providers. Will continue all medications except for stopping the Ambien and will start Tizanidine 4 mg PO Q6H PRN.   Principal Problem: Bipolar 1 disorder, depressed, severe ([place]) Diagnosis:   Patient Active Problem List   Diagnosis Date Noted  . Bipolar 1 disorder, depressed, severe ([place]) [F31.4] 01/25/2018  . Depression [F32.9]   . Anxiety [F41.9] 01/22/2018  . Borderline personality disorder ([place]) [F60.3] 01/22/2018  . Rheumatoid arthritis 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 ([place]) [M06.09] 12/09/2017  . 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 New daily persistent headache [G44.52] 09/26/2017  . MTHFR gene mutation ([place]) [E72.12] 09/20/2017  . Bipolar I disorder ([place]) [F31.9] 09/16/2017  . Fibromyalgia [M79.7] 09/16/2017  . Chronic fatigue syndrome [R53.82] 09/16/2017  . DYSPNEA [R06.02] 10/15/2011  . FEVER, RECURRENT [A68.9] 04/02/2009  . DERMATITIS, ALLERGIC [L25.9] 06/05/2008  . FATIGUE [R53.81, R53.83] 05/05/2008  . HYPOTHYROIDISM [E03.9] 02/10/2008  . 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 FIBROMYALGIA [IMO0001] 02/10/2008  . IRON, SERUM, ELEVATED [R79.89] 02/10/2008   Total  Time spent with patient: 25 minutes  Past Psychiatric History: See H&P  Past Medical History:  Past Medical History:  Diagnosis Date  . Anxiety   . Arthritis   . Back pain, chronic   . Bipolar 1 disorder ([place])   . Chronic fatigue syndrome   . DDD (degenerative disc disease), lumbar   . Depression   . Fibromyalgia   . IUD 2012   Mirena  . MTHFR gene mutation [facility])     Past Surgical History:  Procedure Laterality Date  . COLONOSCOPY WITH PROPOFOL N/A 02/25/2017   Procedure: COLONOSCOPY WITH PROPOFOL;  Surgeon: Arta Silence, MD;  Location: [facility];  Service: Endoscopy;  Laterality: N/A;  . left knee lateral release  1993  . scar tisue removal  03/2005   left ankle  . TONSILLECTOMY  age 20's   Family History:  Family History  Problem Relation Age of Onset  . Diabetes Mother   . Stroke Mother   . Lupus Mother   . Hypertension Mother   . Congestive Heart Failure Mother   . Mental illness Brother        Not clear what his diagnosis is--may be related to previous drug use.  . Cancer Maternal Grandmother        colon  . Depression Maternal Uncle   . Alcohol abuse Maternal Uncle   . Diabetes Maternal Grandfather    Family Psychiatric  History: See H&P Social History:  Social History   Substance and Sexual Activity  Alcohol Use No  . Alcohol/week: 0.0 oz     Social History   Substance and Sexual Activity  Drug Use No  Social History   Socioeconomic History  . Marital status: Single    Spouse name: None  . Number of children: 0  . Years of education: None  . Highest education level: Bachelor's degree (e.g., BA, AB, BS)  Social Needs  . Financial resource strain: None  . Food insecurity - worry: None  . Food insecurity - inability: None  . Transportation needs - medical: None  . Transportation needs - non-medical: None  Occupational History  . Occupation: Designer, industrial/product at times  Tobacco Use  . Smoking status: Never Smoker  . Smokeless tobacco: Never  Used  Substance and Sexual Activity  . Alcohol use: No    Alcohol/week: 0.0 oz  . Drug use: No  . Sexual activity: Yes    Birth control/protection: IUD  Other Topics Concern  . None  Social History Narrative   Originally from [place], outside of [place].    Moved here permanently 2012.   Intermittently worked on a farm.   Lives with many cats--3 plus fosters cats.    Single, lives alone in a one story home. Rarely drinks caffeine. 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   Additional Social History:                         Sleep: Poor  Appetite:  Good  Current Medications: Current Facility-Administered Medications  Medication Dose Route Frequency Provider Last Rate Last Dose  . acetaminophen (TYLENOL) tablet 650 mg  650 mg Oral Q4H PRN Ethelene Hal, NP   650 mg at 01/27/18 1645  . albuterol (PROVENTIL HFA;VENTOLIN HFA) 108 (90 Base) MCG/ACT inhaler 2 puff  2 puff Inhalation Q4H PRN Lindon Romp A, NP      . alum & mag hydroxide-simeth (MAALOX/MYLANTA) 200-200-20 MG/5ML suspension 30 mL  30 mL Oral Q4H PRN Ethelene Hal, NP      . ARIPiprazole (ABILIFY) tablet 10 mg  10 mg Oral Daily Xaine Sansom, Myer Peer, MD   10 mg at 01/31/18 0824  . folic acid (FOLVITE) tablet 800 mcg  800 mcg Oral Daily Leonell Lobdell A, MD   800 mcg at 01/31/18 2355  . hydrOXYzine (ATARAX/VISTARIL) tablet 25 mg  25 mg Oral TID PRN Rolonda Pontarelli, Myer Peer, MD   25 mg at 01/31/18 0859  . magnesium hydroxide (MILK OF MAGNESIA) suspension 30 mL  30 mL Oral Daily PRN Ethelene Hal, NP      . meloxicam [facility]) tablet 15 mg  15 mg Oral Daily Ethelene Hal, NP   15 mg at 01/31/18 7322  . montelukast (SINGULAIR) tablet 10 mg  10 mg Oral QHS Ethelene Hal, NP   10 mg at 01/30/18 2055  . tiZANidine (ZANAFLEX) tablet 4 mg  4 mg Oral Q6H PRN Money, Lowry Ram, FNP      . traMADol (ULTRAM) tablet 50 mg  50 mg Oral Q12H PRN Bruno Leach, Myer Peer, MD   50 mg at  01/29/18 1351  . Vitamin B-12 SUBL 3,000 mcg  3,000 mcg Sublingual Daily Jullie Arps, Myer Peer, MD   3,000 mcg at 01/31/18 0254    Lab Results: No results found for this or any previous visit (from the past 48 hour(s)).  Blood Alcohol level:  Lab Results  Component Value Date   ETH <10 27/05/2375    Metabolic Disorder Labs: Lab Results  Component Value Date   HGBA1C 5.6 03/04/2016   No results found for: PROLACTIN Lab Results  Component Value Date   CHOL 152 03/04/2016   TRIG 81 03/04/2016   HDL 37 (L) 03/04/2016   LDLCALC 99 03/04/2016   LDLCALC 71 12/20/2007    Physical Findings: AIMS: Facial and Oral Movements Muscles of Facial Expression: None, normal Lips and Perioral Area: None, normal Jaw: None, normal Tongue: None, normal,Extremity Movements Upper (arms, wrists, hands, fingers): None, normal Lower (legs, knees, ankles, toes): None, normal, Trunk Movements Neck, shoulders, hips: None, normal, Overall Severity Severity of abnormal movements (highest score from questions above): None, normal Incapacitation due to abnormal movements: None, normal Patient's awareness of abnormal movements (rate only patient's report): No Awareness, Dental Status Current problems with teeth and/or dentures?: No Does patient usually wear dentures?: No  CIWA:    COWS:     Musculoskeletal: Strength & Muscle Tone: within normal limits Gait & Station: normal Patient leans: N/A  Psychiatric Specialty Exam: Physical Exam  Nursing note and vitals reviewed. Constitutional: She is oriented to person, place, and time. She appears well-developed and well-nourished.  Cardiovascular: Normal rate.  Respiratory: Effort normal.  Musculoskeletal: Normal range of motion.  Neurological: She is alert and oriented to person, place, and time.  Skin: Skin is warm.    Review of Systems  Constitutional: Negative.   HENT: Negative.   Eyes: Negative.   Respiratory: Negative.   Cardiovascular:  Negative.   Gastrointestinal: Negative.   Genitourinary: Negative.   Musculoskeletal: Positive for back pain and myalgias.  Skin: Negative.   Neurological: Negative.   Endo/Heme/Allergies: Negative.   Psychiatric/Behavioral: Positive for depression. The patient is nervous/anxious and has insomnia.     Blood pressure 117/73, pulse 93, temperature 97.8 F (36.6 C), temperature source Oral, resp. rate 12, height 5\' 7"  (1.702 m), weight 73.5 kg (162 lb).Body mass index is 25.37 kg/m.  General Appearance: Casual  Eye Contact:  Good  Speech:  Clear and Coherent and Normal Rate  Volume:  Normal  Mood:  Depressed  Affect:  Flat  Thought Process:  Goal Directed and Descriptions of Associations: Intact  Orientation:  Full (Time, Place, and Person)  Thought Content:  WDL  Suicidal Thoughts:  No  Homicidal Thoughts:  No  Memory:  Immediate;   Good Recent;   Good Remote;   Good  Judgement:  Good  Insight:  Good  Psychomotor Activity:  Normal  Concentration:  Concentration: Good and Attention Span: Good  Recall:  Good  Fund of Knowledge:  Good  Language:  Good  Akathisia:  No  Handed:  Right  AIMS (if indicated):     Assets:  Communication Skills Desire for Improvement Financial Resources/Insurance Housing Social Support Transportation  ADL's:  Intact  Cognition:  WNL  Sleep:  Number of Hours: 2.25   Problems Addressed: Bipolar I  Treatment Plan Summary: Daily contact with patient to assess and evaluate symptoms and progress in treatment, Medication management and Plan is to:  -Continue Abilify 10 mg PO Daily for mood stability -Continue Vistaril 25 mg PO TID PRN for anxiety -Continue tramadol 50 mg PO BID for pain -Start Tizanidine 4 mg PO Q6H ORN for muscle spasm or pain -Stop Ambien -Encourage group therapy.  [place], FNP 01/31/2018, 1:21 PM   Agree with NP Progress Note

## 2018-01-31 NOTE — B[facility] Group Notes (Signed)
Adult Psychoeducational Group Note  Date:  01/31/2018 Time:  4:51 AM  Group Topic/Focus:  Wrap-Up Group:   The focus of this group is to help patients review their daily goal of treatment and discuss progress on daily workbooks.  Participation Level:  Active  Participation Quality:  Appropriate and Attentive  Affect:  Appropriate  Cognitive:  Alert and Appropriate  Insight: Appropriate and Good  Engagement in Group:  Engaged  Modes of Intervention:  Discussion and Education  Additional Comments:  Pt attended and participated in wrap up group this evening. Pt had an "alright" day, evening though they were not feeling good. Pt goal was to work on their moods and a positive noted by the pt was that they found meeting in group.   Cristi Loron 01/31/2018, 4:51 AM

## 2018-01-31 NOTE — Progress Notes (Signed)
Adult Psychoeducational Group Note  Date:  01/31/2018 Time:  11:52 AM  Group Topic/Focus:  Orientation:   The focus of this group is to educate the patient on the purpose and policies of crisis stabilization and provide a format to answer questions about their admission.  The group details unit policies and expectations of patients while admitted.  Participation Level:  Active  Participation Quality:  Appropriate and Attentive  Affect:  Appropriate  Cognitive:  Appropriate  Insight: Appropriate  Engagement in Group:  Engaged  Modes of Intervention:  Orientation  Additional Comments:  Pt did attend group and actively participated.  Tonia Brooms D 01/31/2018, 11:52 AM

## 2018-01-31 NOTE — BHH Group Notes (Signed)
Identifying Needs   Date:  01/31/2018  Time:  4:37 PM  Type of Therapy:  Nurse Education   /  The group focuses on teaching patients how to identify their needs and then hwo to develop the skills needed to get their needs met.  Participation Level:  Active  Participation Quality:  Attentive  Affect:  Appropriate  Cognitive:  Appropriate  Insight:  Good  Engagement in Group:  Engaged  Modes of Intervention:  Education  Summary of Progress/Problems:  Frances [name], Frances [name] 01/31/2018, 4:37 PM

## 2018-01-31 NOTE — BHH Group Notes (Addendum)
Relaxation  Date:  01/31/2018  Time:  6:10 PM  Type of Therapy:  Nurse Education  /  The group focuses on teaching patients how to utilize deep breathing to facilitate and maintain a state of progressive relaxation.  Participation Level:  Active  Participation Quality:  Attentive  Affect:  Appropriate  Cognitive:  Appropriate  Insight:  Good  Engagement in Group:  Engaged  Modes of Intervention:  Education  Summary of Progress/Problems:  Frances, [name] 01/31/2018, 6:10 PM

## 2018-01-31 NOTE — Progress Notes (Signed)
Writer spoke with patient 1:1 and requested her medications so that she could hopefully get some rest. She was somewhat agitated and c/o how loud our unit is during the daytime as well as night time. She reported that she feel that her medication is causing her to feel dizzy which she reports mentioning to the doctor today. Writer has observed her in the dayroom tonight but little to no interaction with peers. Support given and safety maintained on unit with 15 min checks.

## 2018-02-01 ENCOUNTER — Ambulatory Visit (HOSPITAL_COMMUNITY): Payer: Self-pay | Admitting: Licensed Clinical Social Worker

## 2018-02-01 DIAGNOSIS — R456 Violent behavior: Secondary | ICD-10-CM

## 2018-02-01 MED ORDER — HYDROXYZINE HCL 25 MG PO TABS: 25.0000 mg | ORAL_TABLET | Freq: Three times a day (TID) | ORAL | 0 refills | Status: DC | PRN

## 2018-02-01 MED ORDER — ARIPIPRAZOLE 10 MG PO TABS: 10.0000 mg | ORAL_TABLET | Freq: Every day | ORAL | 0 refills | Status: DC

## 2018-02-01 MED ORDER — FOLIC ACID 800 MCG PO TABS: 800.0000 ug | ORAL_TABLET | Freq: Every day | ORAL | 0 refills | Status: DC

## 2018-02-01 NOTE — Progress Notes (Signed)
Discharge note:  Patient discharged home per MD order.  Reviewed AVS/transition record with patient and she indicated understanding.  Patient will follow up with [facility] and [facility].  She denies any thoughts of self harm.  She left with prescriptions and samples of her medications.  Patient received all personal belongings from unit and locker.  Patient left ambulatory with a friend.

## 2018-02-01 NOTE — BHH Suicide Risk Assessment (Signed)
[facility] Discharge Suicide Risk Assessment   Principal Problem: Bipolar 1 disorder, depressed, severe ([place]) Discharge Diagnoses:  Patient Active Problem List   Diagnosis Date Noted  . Bipolar 1 disorder, depressed, severe ([place]) [F31.4] 01/25/2018  . Depression [F32.9]   . Anxiety [F41.9] 01/22/2018  . Borderline personality disorder ([place]) [F60.3] 01/22/2018  . Rheumatoid arthritis 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 ([place]) [M06.09] 12/09/2017  . 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 New daily persistent headache [G44.52] 09/26/2017  . MTHFR gene mutation ([place]) [E72.12] 09/20/2017  . Bipolar I disorder ([place]) [F31.9] 09/16/2017  . Fibromyalgia [M79.7] 09/16/2017  . Chronic fatigue syndrome [R53.82] 09/16/2017  . DYSPNEA [R06.02] 10/15/2011  . FEVER, RECURRENT [A68.9] 04/02/2009  . DERMATITIS, ALLERGIC [L25.9] 06/05/2008  . FATIGUE [R53.81, R53.83] 05/05/2008  . HYPOTHYROIDISM [E03.9] 02/10/2008  . 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 FIBROMYALGIA [IMO0001] 02/10/2008  . IRON, SERUM, ELEVATED [R79.89] 02/10/2008    Total Time spent with patient: 30 minutes  Musculoskeletal: Strength & Muscle Tone: within normal limits Gait & Station: normal Patient leans: N/A  Psychiatric Specialty Exam: ROS describes mild headache, nasal congestion, no chest pain, reports chronic right  hip pain , lower back pain  Blood pressure 99/72, pulse (!) 101, temperature 98 F (36.7 C), temperature source Oral, resp. rate 18, height 5\' 7"  (1.702 m), weight 73.5 kg (162 lb).Body mass index is 25.37 kg/m.  General Appearance: improved grooming   Eye Contact::  Good  Speech:  Normal Rate409  Volume:  Normal  Mood:  improving mood, reports she is feeling better than before admission, describes mood as a 6/10  Affect:  Appropriate  Thought Process:  Linear and Descriptions of Associations: Intact   Orientation:  Full (Time, Place, and Person)  Thought Content:  No hallucinations,no delusions , not internally preoccupied   Suicidal Thoughts:  No no suicidal or self injurious ideations, denies homicidal ideations  Homicidal Thoughts:  No  Memory:  recent and remote grossly intact   Judgement:  Other:  improving   Insight:  improving   Psychomotor Activity:  Normal  Concentration:  Good  Recall:  Good  Fund of Knowledge:Good  Language: Good  Akathisia:  Negative  Handed:  Right  AIMS (if indicated):     Assets:  Communication Skills Desire for Improvement Resilience  Sleep:  Number of Hours: 4  Cognition: WNL  ADL's:  Intact   Mental Status Per Nursing Assessment::   On Admission:     Demographic Factors:  42 year old female , single, lives alone, unemployed   Loss Factors: Financial difficulties , states she felt her outpatient providers were not a good match for her  Historical Factors: Reports she has been diagnosed with Bipolar Disorder and with Borderline Personality Disorder .  No history of suicide attempts, has had episodes of head banging in the past   Risk Reduction Factors:   Positive coping skills or problem solving skills and sense of responsibility to her pets/horse   Continued Clinical Symptoms:  At this time patient presents with partial improvement compared to admission. Presents alert, attentive, less irritable, with improved range of affect. Reports her mood as improved , and affect does present more reactive, smiles at times appropriately, no thought disorder, no suicidal or self injurious ideations, no homicidal or violent ideations, future oriented. Reports mild nausea with Abilify , but no other side effects . On unit has been vaguely irritable at  times, but behavior in good control, visible in day room.   Cognitive Features That Contribute To Risk:  No gross cognitive deficits noted upon discharge. Is alert , attentive, and oriented x 3    Suicide Risk:  Mild:  Suicidal ideation of limited frequency, intensity, duration, and specificity.  There are no identifiable plans, no associated intent, mild dysphoria and related symptoms, good self-control (both objective and subjective assessment), few other risk factors, and identifiable protective factors, including available and accessible social support.  Follow-up Information    [facility]. Go on 02/22/2018.   Specialty:  Behavioral Health Why:  Please attend your therapy appointment with Rodney Cruise on Monday, 02/22/18, at 3:00pm. Contact information: [place] [phone]       Psychiatry, [place] Department Of Follow up.   Why:  Left VM to determine what documents are required for a referral. Contact information: [place]  [address] [phone]        [facility] Follow up on 02/02/2018.   Specialty:  Behavioral Health Why:  Hospital follow-up for medication management on Tuesday, 02/02/18 at 8:45AM. Please bring: photo ID, social security card, proof of income if you have it, and hospital discharge paperwork. Thank you.  Contact information: [place] [address] [phone]           Plan Of Care/Follow-up recommendations:  Activity:  as tolerated  Diet:  regular  Tests:  NA Other:  see below  Patient is expressing readiness for discharge and there are no current grounds for involuntary commitment  Plans to return home Follow up as above .  Jenne Campus, MD 02/01/2018, 8:21 AM

## 2018-02-01 NOTE — Plan of Care (Signed)
Completed/Met Activity: Interest or engagement in activities will improve 02/01/2018 0947 - Completed/Met by Joice Lofts, RN Sleeping patterns will improve 02/01/2018 0947 - Completed/Met by Joice Lofts, RN Education: Knowledge of [place] Education information/materials will improve 02/01/2018 [phone] - Completed/Met by Joice Lofts, RN Emotional status will improve 02/01/2018 0947 - Completed/Met by Joice Lofts, RN Mental status will improve 02/01/2018 0947 - Completed/Met by Joice Lofts, RN Verbalization of understanding the information provided will improve 02/01/2018 0947 - Completed/Met by Joice Lofts, RN Coping: Ability to verbalize frustrations and anger appropriately will improve 02/01/2018 0947 - Completed/Met by Joice Lofts, RN Ability to demonstrate self-control will improve 02/01/2018 0947 - Completed/Met by Joice Lofts, [place] Behavior/Discharge Planning: Identification of resources available to assist in meeting health care needs will improve 02/01/2018 0947 - Completed/Met by Joice Lofts, RN Compliance with treatment plan for underlying cause of condition will improve 02/01/2018 0947 - Completed/Met by Joice Lofts, RN Physical Regulation: Ability to maintain clinical measurements within normal limits will improve 02/01/2018 0947 - Completed/Met by Joice Lofts, RN Safety: Periods of time without injury will increase 02/01/2018 0947 - Completed/Met by Joice Lofts, RN Activity: Interest or engagement in leisure activities will improve 02/01/2018 0947 - Completed/Met by Joice Lofts, RN Imbalance in normal sleep/wake cycle will improve 02/01/2018 0947 - Completed/Met by Joice Lofts, RN Education: Utilization of techniques to improve thought processes will improve 02/01/2018 0947 - Completed/Met by Joice Lofts, RN Knowledge of the prescribed therapeutic  regimen will improve 02/01/2018 0947 - Completed/Met by Joice Lofts, RN Coping: Ability to cope will improve 02/01/2018 0947 - Completed/Met by Joice Lofts, RN Ability to verbalize feelings will improve 02/01/2018 0947 - Completed/Met by Joice Lofts, [place] Behavior/Discharge Planning: Ability to make decisions will improve 02/01/2018 0947 - Completed/Met by Joice Lofts, RN Compliance with therapeutic regimen will improve 02/01/2018 0947 - Completed/Met by Joice Lofts, RN Role Relationship: Ability to demonstrate positive changes in social behaviors and relationships will improve 02/01/2018 0947 - Completed/Met by Joice Lofts, RN Safety: Ability to disclose and discuss suicidal ideas will improve 02/01/2018 0947 - Completed/Met by Joice Lofts, RN Ability to identify and utilize support systems that promote safety will improve 02/01/2018 0947 - Completed/Met by Joice Lofts, RN Self-Concept: Ability to verbalize positive feelings about self will improve 02/01/2018 0947 - Completed/Met by Joice Lofts, RN Level of anxiety will decrease 02/01/2018 0947 - Completed/Met by Joice Lofts, RN Education: Ability to make informed decisions regarding treatment will improve 02/01/2018 0947 - Completed/Met by Joice Lofts, RN Coping: Ability to cope will improve 02/01/2018 0947 - Completed/Met by Joice Lofts, [place] Behavior/Discharge Planning: Identification of resources available to assist in meeting health care needs will improve 02/01/2018 0947 - Completed/Met by Joice Lofts, RN Medication: Compliance with prescribed medication regimen will improve 02/01/2018 0947 - Completed/Met by Joice Lofts, RN Self-Concept: Ability to disclose and discuss suicidal ideas will improve 02/01/2018 0947 - Completed/Met by Joice Lofts, RN Ability to verbalize positive feelings about self will  improve 02/01/2018 0947 - Completed/Met by Joice Lofts, RN Activity: Will identify at least one activity in which they can participate 02/01/2018 0947 - Completed/Met by Joice Lofts, RN Coping: Ability to identify and develop effective coping behavior will improve 02/01/2018 0947 - Completed/Met by Joice Lofts, RN Ability to interact with others  will improve 02/01/2018 0947 - Completed/Met by Joice Lofts, RN Participation in decision-making will improve 02/01/2018 [phone] - Completed/Met by Joice Lofts, RN Ability to use eye contact when communicating with others will improve 02/01/2018 0947 - Completed/Met by Joice Lofts, [place] Behavior/Discharge Planning: Identification of resources available to assist in meeting health care needs will improve 02/01/2018 0947 - Completed/Met by Joice Lofts, RN Self-Concept: Ability to verbalize positive feelings about self will improve 02/01/2018 0947 - Completed/Met by Joice Lofts, RN Spiritual Needs Ability to function at adequate level 02/01/2018 0947 - Completed/Met by Joice Lofts, RN

## 2018-02-01 NOTE — Progress Notes (Signed)
Writer spoke with patient 1:1 who inquired about what changes were made to her medications to help aid with sleep tonight. Writer informed her of the medication ordered and she reported that she typically takes 8 mg. Writer informed her of the times available and to see if the 4 mg will work. She reported that she will probably not discharge on tomorrow since her medication has been changed. She was encouraged to shower and prepare for bed before taking her medication to help her relax. Support given and safety maintained on unit with 15 min checks.

## 2018-02-01 NOTE — Discharge Summary (Addendum)
Physician Discharge Summary Note  Patient:  Frances [name] is an 42 y.o., female MRN:  [id number] DOB:  1976-04-20 Patient phone:  [phone] (home)  Patient address:   [place] [address],  Total Time spent with patient: 30 minutes  Date of Admission:  01/25/2018 Date of Discharge: 02/01/2018  Reason for Admission: Per assessment note-  42 year old female . Patient presented to ED voluntarily on 2/3 due to worsening depression, short lived mood swings. Reports mood symptoms have been chronic, but seem to be worsening recently. States " I also starting harming myself again" over recent days to weeks- reports self injurious behaviors , such as punching walls and banging head .  Reports recent suicidal ideations with thoughts of overdosing  In addition to depressive symptoms as above, states that she has frequent short lived mood swings, irritability.[name] Describes neuro-vegetative symptoms of depression as below. Admission UDS is negative, admission BAL is <10.   Principal Problem: Bipolar 1 disorder, depressed, severe ([place]) Discharge Diagnoses: Patient Active Problem List   Diagnosis Date Noted  . Bipolar 1 disorder, depressed, severe ([place]) [F31.4] 01/25/2018  . Depression [F32.9]   . Anxiety [F41.9] 01/22/2018  . Borderline personality disorder ([place]) [F60.3] 01/22/2018  . Rheumatoid arthritis 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 ([place]) [M06.09] 12/09/2017  . 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 New daily persistent headache [G44.52] 09/26/2017  . MTHFR gene mutation ([place]) [E72.12] 09/20/2017  . Bipolar I disorder ([place]) [F31.9] 09/16/2017  . Fibromyalgia [M79.7] 09/16/2017  . Chronic fatigue syndrome [R53.82] 09/16/2017  . DYSPNEA [R06.02] 10/15/2011  . FEVER, RECURRENT [A68.9] 04/02/2009  . DERMATITIS, ALLERGIC [L25.9] 06/05/2008  . FATIGUE [R53.81, R53.83] 05/05/2008  . HYPOTHYROIDISM [E03.9] 02/10/2008  . 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 FIBROMYALGIA [IMO0001] 02/10/2008  . IRON, SERUM, ELEVATED [R79.89] 02/10/2008    Past Psychiatric History:   Past Medical History:  Past Medical History:  Diagnosis Date  . Anxiety   . Arthritis   . Back pain, chronic   . Bipolar 1 disorder ([place])   . Chronic fatigue syndrome   . DDD (degenerative disc disease), lumbar   . Depression   . Fibromyalgia   . IUD 2012   Mirena  . MTHFR gene mutation [facility])     Past Surgical History:  Procedure Laterality Date  . COLONOSCOPY WITH PROPOFOL N/A 02/25/2017   Procedure: COLONOSCOPY WITH PROPOFOL;  Surgeon: Arta Silence, MD;  Location: [facility] ENDOSCOPY;  Service: Endoscopy;  Laterality: N/A;  . left knee lateral release  1993  . scar tisue removal  03/2005   left ankle  . TONSILLECTOMY  age 61's   Family History:  Family History  Problem Relation Age of Onset  . Diabetes Mother   . Stroke Mother   . Lupus Mother   . Hypertension Mother   . Congestive Heart Failure Mother   . Mental illness Brother        Not clear what his diagnosis is--may be related to previous drug use.  . Cancer Maternal Grandmother        colon  . Depression Maternal Uncle   . Alcohol abuse Maternal Uncle   . Diabetes Maternal Grandfather    Family Psychiatric  History:  Social History:  Social History   Substance and Sexual Activity  Alcohol Use No  . Alcohol/week: 0.0 oz     Social History   Substance and Sexual Activity  Drug Use No    Social History   Socioeconomic History  . Marital status: Single    Spouse name: None  . Number of children: 0  . Years of education: None  . Highest education level: Bachelor's degree (e.g., BA, AB, BS)  Social Needs  . Financial resource strain: None  . Food insecurity - worry: None  . Food insecurity - inability: None  . Transportation needs - medical: None  . Transportation needs - non-medical: None   Occupational History  . Occupation: Designer, industrial/product at times  Tobacco Use  . Smoking status: Never Smoker  . Smokeless tobacco: Never Used  Substance and Sexual Activity  . Alcohol use: No    Alcohol/week: 0.0 oz  . Drug use: No  . Sexual activity: Yes    Birth control/protection: IUD  Other Topics Concern  . None  Social History Narrative   Originally from [place], outside of [place].    Moved here permanently 2012.   Intermittently worked on a farm.   Lives with many cats--3 plus fosters cats.    Single, lives alone in a one story home. Rarely drinks caffeine. 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    Hospital Course:  Frances [name] was admitted for Bipolar 1 disorder, depressed, severe ([place])  and crisis management.  Pt was treated discharged with the medications listed below under Medication List.  Medical problems were identified and treated as needed.  Home medications were restarted as appropriate.  Improvement was monitored by observation and Frances [name] 's daily report of symptom reduction.  Emotional and mental status was monitored by daily self-inventory reports completed by Frances [name] and clinical staff.         Frances [name] was evaluated by the treatment team for stability and plans for continued recovery upon discharge. Frances [name] 's motivation was an integral factor for scheduling further treatment. Employment, transportation, bed availability, health status, family support, and any pending legal issues were also considered during hospital stay. Pt was offered further treatment options upon discharge including but not limited to Residential, Intensive Outpatient, and Outpatient treatment.  Frances [name] will follow up with the services as listed below under Follow Up Information.     Upon completion of this admission the patient was both mentally and medically stable for discharge denying  suicidal/homicidal ideation, auditory/visual/tactile hallucinations, delusional thoughts and paranoia.    Frances [name] responded well to treatment with  Abilify 10 g and vistaril 25mg  without adverse effects.  Pt demonstrated improvement without reported or observed adverse effects to the point of stability appropriate for outpatient management. Pertinent labs include: CMP for which outpatient follow-up is necessary for lab recheck as mentioned below. Reviewed CBC, CMP, BAL, and UDS; all unremarkable aside from noted exceptions.   Physical Findings: AIMS: Facial and Oral Movements Muscles of Facial Expression: None, normal Lips and Perioral Area: None, normal Jaw: None, normal Tongue: None, normal,Extremity Movements Upper (arms, wrists, hands, fingers): None, normal Lower (legs, knees, ankles, toes): None, normal, Trunk Movements Neck, shoulders, hips: None, normal, Overall Severity Severity of abnormal movements (highest score from questions above): None, normal Incapacitation due to abnormal movements: None, normal Patient's awareness of abnormal movements (rate only patient's report): No Awareness, Dental Status Current problems with teeth and/or dentures?: No Does patient usually wear dentures?: No  CIWA:    COWS:     Musculoskeletal: Strength & Muscle Tone: within normal limits Gait & Station:  normal Patient leans: N/A  Psychiatric Specialty Exam:See SRA by MD Physical Exam  Vitals reviewed. Constitutional: She appears well-developed.  Cardiovascular: Normal rate.  Neurological: She is alert.  Psychiatric: She has a normal mood and affect. Her behavior is normal.    Review of Systems  Psychiatric/Behavioral: Negative for depression and suicidal ideas. The patient is not nervous/anxious.   All other systems reviewed and are negative.   Blood pressure 99/72, pulse (!) 101, temperature 98 F (36.7 C), temperature source Oral, resp. rate 18, height 5\' 7"  (1.702 m),  weight 73.5 kg (162 lb).Body mass index is 25.37 kg/m.    Have you used any form of tobacco in the last 30 days? (Cigarettes, Smokeless Tobacco, Cigars, and/or Pipes): No  Has this patient used any form of tobacco in the last 30 days? (Cigarettes, Smokeless Tobacco, Cigars, and/or Pipes)  No  Blood Alcohol level:  Lab Results  Component Value Date   ETH <10 39/76/7341    Metabolic Disorder Labs:  Lab Results  Component Value Date   HGBA1C 5.6 03/04/2016   No results found for: PROLACTIN Lab Results  Component Value Date   CHOL 152 03/04/2016   TRIG 81 03/04/2016   HDL 37 (L) 03/04/2016   LDLCALC 99 03/04/2016   [place] 71 12/20/2007    See Psychiatric Specialty Exam and Suicide Risk Assessment completed by Attending Physician prior to discharge.  Discharge destination:  Home  Is patient on multiple antipsychotic therapies at discharge:  No   Has Patient had three or more failed trials of antipsychotic monotherapy by history:  No  Recommended Plan for Multiple Antipsychotic Therapies: NA  Discharge Instructions    Diet - low sodium heart healthy   Complete by:  As directed    Discharge instructions   Complete by:  As directed    Take all medications as prescribed. Keep all follow-up appointments as scheduled.  Do not consume alcohol or use illegal drugs while on prescription medications. Report any adverse effects from your medications to your primary care provider promptly.  In the event of recurrent symptoms or worsening symptoms, call 911, a crisis hotline, or go to the nearest emergency department for evaluation.   Increase activity slowly   Complete by:  As directed      Allergies as of 02/01/2018      Reactions   Metaxalone Hives      Medication List    STOP taking these medications   cetirizine 10 MG tablet Commonly known as:  ZYRTEC   FISH OIL PO   Folinic Acid-Vit B6-Vit B12 4-50-2 MG Tabs   GLUCOSAMINE PO   HYDROcodone-acetaminophen 5-325  MG tablet Commonly known as:  NORCO/VICODIN   l-methylfolate-B6-B12 3-35-2 MG Tabs tablet Commonly known as:  METANX   LUBRICATING EYE DROPS OP   MAGNESIUM GLYCINATE PLUS PO   methotrexate 2.5 MG tablet Commonly known as:  RHEUMATREX   mometasone 50 MCG/ACT nasal spray Commonly known as:  NASONEX   naproxen 500 MG tablet Commonly known as:  NAPROSYN   Oxycodone HCl 10 MG Tabs   thioridazine 10 MG tablet Commonly known as:  MELLARIL   tizanidine 2 MG capsule Commonly known as:  ZANAFLEX   traMADol 50 MG tablet Commonly known as:  ULTRAM   traZODone 50 MG tablet Commonly known as:  DESYREL     TAKE these medications     Indication  ARIPiprazole 10 MG tablet Commonly known as:  ABILIFY Take 1 tablet (10 mg total) by  mouth daily. Start taking on:  02/02/2018  Indication:  Major Depressive Disorder   folic acid 283 MCG tablet Commonly known as:  FOLVITE Take 1 tablet (800 mcg total) by mouth daily. Start taking on:  02/02/2018  Indication:  Anemia From Inadequate Folic Acid   hydrOXYzine 25 MG tablet Commonly known as:  ATARAX/VISTARIL Take 1 tablet (25 mg total) by mouth 3 (three) times daily as needed for anxiety.  Indication:  Feeling Anxious   meloxicam 15 MG tablet Commonly known as:  MOBIC Take 1 tablet (15 mg total) by mouth daily.  Indication:  pain   montelukast 10 MG tablet Commonly known as:  SINGULAIR Take 1 tablet (10 mg total) by mouth at bedtime.  Indication:  Hayfever   PROVENTIL HFA 108 (90 Base) MCG/ACT inhaler Generic drug:  albuterol INHALE 2 PUFFS BY MOUTH FOUR TIMES DAILY AS NEEDED FOR COUGHING, WHEEZING, OR SHORTNESS OF BREATH  Indication:  Asthma      Follow-up Information    [facility]. Go on 02/22/2018.   Specialty:  Behavioral Health Why:  Please attend your therapy appointment with Rodney Cruise on Monday, 02/22/18, at 3:00pm. Contact information: [place] [phone]       [facility] Follow up on 02/02/2018.   Specialty:  Behavioral Health Why:  Hospital follow-up for medication management on Tuesday, 02/02/18 at 8:45AM. Please bring: photo ID, social security card, proof of income if you have it, and hospital discharge paperwork. Thank you.  Contact information: [place] [address] [phone]           Follow-up recommendations:  Activity:  as tolerated Diet:  heart healthy  Comments:  Take all medications as prescribed. Keep all follow-up appointments as scheduled.  Do not consume alcohol or use illegal drugs while on prescription medications. Report any adverse effects from your medications to your primary care provider promptly.  In the event of recurrent symptoms or worsening symptoms, call 911, a crisis hotline, or go to the nearest emergency department for evaluation.   Signed: Derrill Center, NP 02/01/2018, 2:31 PM   Patient seen, Suicide Assessment Completed.  Disposition Plan Reviewed

## 2018-02-01 NOTE — [facility] Suicide Risk Assessment (Signed)
[facility] INPATIENT:  Family/Significant Other Suicide Prevention Education  Suicide Prevention Education:  Patient Refusal for Family/Significant Other Suicide Prevention Education: The patient Frances [name] has refused to provide written consent for family/significant other to be provided Family/Significant Other Suicide Prevention Education during admission and/or prior to discharge.  Physician notified.  SPE completed with patient, as patient refused to consent to family contact. SPI pamphlet provided to pt and pt was encouraged to share information with support network, ask questions, and talk about any concerns relating to SPE. Patient denies access to guns/firearms and verbalized understanding of information provided. Mobile Crisis information also provided to patient.   [name] 02/01/2018, 9:39 AM

## 2018-02-01 NOTE — Progress Notes (Addendum)
  [facility] Adult Case Management Discharge Plan :  Will you be returning to the same living situation after discharge:  Yes,  alone At discharge, do you have transportation home?: Yes,  friend will pick her up Do you have the ability to pay for your medications: No.  Release of information consent forms completed and in the chart;  Patient's signature needed at discharge.  Patient to Follow up at: Follow-up Information    [facility]. Go on 02/22/2018.   Specialty:  Behavioral Health Why:  Please attend your therapy appointment with Rodney Cruise on Monday, 02/22/18, at 3:00pm. Contact information: [place] [phone]       [facility] Follow up on 02/02/2018.   Specialty:  Behavioral Health Why:  Hospital follow-up for medication management on Tuesday, 02/02/18 at 8:45AM. Please bring: photo ID, social security card, proof of income if you have it, and hospital discharge paperwork. Thank you.  Contact information: [place] [address] [phone]           Next level of care provider has access to [place] and Suicide Prevention discussed: Yes,  with the patient   Have you used any form of tobacco in the last 30 days? (Cigarettes, Smokeless Tobacco, Cigars, and/or Pipes): No  Has patient been referred to the Quitline?: N/A patient is not a smoker  Patient has been referred for addiction treatment: [place] [place] 02/01/2018, 10:48 AM

## 2018-02-01 NOTE — Progress Notes (Signed)
Recreation Therapy Notes  Date: 02/01/18 Time: 0930 Location: 300 Hall Dayroom  Group Topic: Stress Management  Goal Area(s) Addresses:  Patient will verbalize importance of using healthy stress management.  Patient will identify positive emotions associated with healthy stress management.   Intervention: Stress Management  Activity :  Meditation.  LRT introduced the stress management technique of meditation.  LRT played a meditation that allowed patients to focus on finding forgiveness for people who may have wronged them.  Education:  Stress Management, Discharge Planning.   Education Outcome: Acknowledges edcuation/In group clarification offered/Needs additional education  Clinical Observations/Feedback: Pt did not attend group.    Victorino Sparrow, LRT/CTRS         Ria Comment, Francis Doenges A 02/01/2018 10:55 AM

## 2018-02-02 LAB — VECTRA(R) DA DISEASE ACTIVITY
CRP Result: 0.77 mg/L
EGF Result: 220 pg/mL
IL-6 Result: 1.8 pg/mL
Leptin Result: 7.4 ng/mL
MMP-1 Result: 7.9 ng/mL
MMP-3 Result: 15 ng/mL
Resistin Result: 9.3 ng/mL
SAA Result: 0.14 ug/mL
TNF-RI Result: 0.74 ng/mL
Unadjusted Score: 13
VCAM-1 Result: 0.45 ug/mL
VEGF-A Result: 82 pg/mL
Vectra(R) DA Level: LOW
Vectra(R) DA Score: 23
YKL-40 Result: 43 ng/mL

## 2018-02-02 NOTE — Progress Notes (Signed)
Call pt: [name] we finally got results back from your VECTRA.   Amber, please fax these to rheumatologist.   Test shows low disease activity. On a scale from 1 to 100 you are a 23. Moderate activity level starts at 30.

## 2018-02-04 ENCOUNTER — Encounter: Payer: Self-pay | Admitting: Family Medicine

## 2018-02-04 ENCOUNTER — Ambulatory Visit (INDEPENDENT_AMBULATORY_CARE_PROVIDER_SITE_OTHER): Payer: Self-pay | Admitting: Family Medicine

## 2018-02-04 VITALS — BP 114/76 | HR 103 | Temp 98.1°F | Ht 67.0 in | Wt 167.0 lb

## 2018-02-04 DIAGNOSIS — Z23 Encounter for immunization: Secondary | ICD-10-CM

## 2018-02-04 DIAGNOSIS — J0100 Acute maxillary sinusitis, unspecified: Secondary | ICD-10-CM

## 2018-02-04 MED ORDER — AMOXICILLIN-POT CLAVULANATE 875-125 MG PO TABS: 1.0000 | ORAL_TABLET | Freq: Two times a day (BID) | ORAL | 0 refills | Status: DC

## 2018-02-04 NOTE — Progress Notes (Signed)
   Subjective:    Patient ID: Frances [name], female    DOB: 26-Sep-1976, 42 y.o.   MRN: [id number]  HPI 42 year old female comes in today complaining of sinus congestion and drainage and cough for about 5 days.  She reports some low-grade fevers and she is been using Tylenol.  Drainage has been yellow and green in color.  She is had a lot of drainage in her throat.  She was [facility].  She says even before the sinus congestion and cough started she was feeling a tightness in her chest for several days.  No shortness of breath or wheezing.  Using some Mucinex D.  No worsening or alleviating factors.   Review of Systems     Objective:   Physical Exam  Constitutional: She is oriented to person, place, and time. She appears well-developed and well-nourished.  HENT:  Head: Normocephalic and atraumatic.  Right Ear: External ear normal.  Left Ear: External ear normal.  Nose: Nose normal.  Mouth/Throat: Oropharynx is clear and moist.  TMs and canals are clear. OP with mild erythema  Eyes: Conjunctivae and EOM are normal. Pupils are equal, round, and reactive to light.  Neck: Neck supple. No thyromegaly present.  Cardiovascular: Normal rate, regular rhythm and normal heart sounds.  Pulmonary/Chest: Effort normal and breath sounds normal. She has no wheezes.  Lymphadenopathy:    She has no cervical adenopathy.  Neurological: She is alert and oriented to person, place, and time.  Skin: Skin is warm and dry.  Psychiatric: She has a normal mood and affect.        Assessment & Plan:  Acute sinusitis -we will go ahead and treat with Augmentin.  If not significantly better by early next week then please give [place] a call back.  She can hold her methotrexate for today.  She has a follow-up with her rheumatologist on Monday anyway.  Recommend a trial of nasal saline spray and okay to continue Tylenol and Mucinex as needed.

## 2018-02-04 NOTE — Addendum Note (Signed)
Addended by: Teddy Spike on: 02/04/2018 10:55 AM   Modules accepted: Orders

## 2018-02-04 NOTE — Patient Instructions (Addendum)
Sinus Rinse What is a sinus rinse? A sinus rinse is a home treatment. It rinses your sinuses with a mixture of salt and water (saline solution). Sinuses are air-filled spaces in your skull behind the bones of your face and forehead. They open into your nasal cavity. To do a sinus rinse, you will need:  Saline solution.  Neti pot or spray bottle. This releases the saline solution into your nose and through your sinuses. You can buy neti pots and spray bottles at: ? Your local pharmacy. ? A health food store. ? Online.  When should I do a sinus rinse? A sinus rinse can help to clear your nasal cavity. It can clear:  Mucus.  Dirt.  Dust.  Pollen.  You may do a sinus rinse when you have:  A cold.  A virus.  Allergies.  A sinus infection.  A stuffy nose.  If you are considering a sinus rinse:  Ask your child's doctor before doing a sinus rinse on your child.  Do not do a sinus rinse if you have had: ? Ear or nasal surgery. ? An ear infection. ? Blocked ears.  How do I do a sinus rinse?  Wash your hands.  Disinfect your device using the directions that came with the device.  Dry your device.  Use the solution that comes with your device or one that is sold separately in stores. Follow the mixing directions on the package.  Fill your device with the amount of saline solution as stated in the device instructions.  Stand over a sink and tilt your head sideways over the sink.  Place the spout of the device in your upper nostril (the one closer to the ceiling).  Gently pour or squeeze the saline solution into the nasal cavity. The liquid should drain to the lower nostril if you are not too congested.  Gently blow your nose. Blowing too hard may cause ear pain.  Repeat in the other nostril.  Clean and rinse your device with clean water.  Air-dry your device. Are there risks of a sinus rinse? Sinus rinse is normally very safe and helpful. However, there are a  few risks, which include:  A burning feeling in the sinuses. This may happen if you do not make the saline solution as instructed. Make sure to follow all directions when making the saline solution.  Infection from unclean water. This is rare, but possible.  Nasal irritation.  This information is not intended to replace advice given to you by your health care provider. Make sure you discuss any questions you have with your health care provider. Document Released: 07/05/2014 Document Revised: 11/04/2016 Document Reviewed: 04/25/2014 Elsevier Interactive Patient Education  2017 Elsevier Inc.  

## 2018-02-08 ENCOUNTER — Ambulatory Visit: Admit: 2018-02-08 | Discharge: 2018-02-09 | Attending: Rheumatology | Primary: Rheumatology

## 2018-02-08 DIAGNOSIS — M1990 Unspecified osteoarthritis, unspecified site: Principal | ICD-10-CM

## 2018-02-08 MED ORDER — LEFLUNOMIDE 10 MG TABLET: 10 mg | tablet | Freq: Every day | 2 refills | 0 days | Status: AC

## 2018-02-08 MED ORDER — LEFLUNOMIDE 10 MG TABLET
ORAL_TABLET | Freq: Every day | ORAL | 2 refills | 0.00000 days | Status: CP
Start: 2018-02-08 — End: 2018-04-12

## 2018-02-09 ENCOUNTER — Ambulatory Visit (HOSPITAL_COMMUNITY): Payer: Self-pay | Admitting: Licensed Clinical Social Worker

## 2018-02-11 ENCOUNTER — Encounter: Payer: Self-pay | Admitting: Physician Assistant

## 2018-02-12 NOTE — Telephone Encounter (Signed)
They seem to? It would be something you had to do.

## 2018-02-12 NOTE — Telephone Encounter (Signed)
Do her directions make sense?

## 2018-02-22 ENCOUNTER — Ambulatory Visit: Payer: No Typology Code available for payment source | Admitting: Psychology

## 2018-02-22 NOTE — Telephone Encounter (Signed)
Amber what is the status on this. I gave you the information on it once pt had decided to do it or not. I would assume you just call the company that I gave you and have the kit sent here. KG LPN

## 2018-02-25 MED FILL — LEFLUNOMIDE/10MG/TABS: LEFLUNOMIDE/10MG/TABS | 90 days supply | Qty: 90 | Fill #0

## 2018-03-02 NOTE — Telephone Encounter (Signed)
Error

## 2018-04-07 ENCOUNTER — Ambulatory Visit: Admit: 2018-04-07 | Discharge: 2018-04-08 | Attending: Rheumatology | Primary: Rheumatology

## 2018-04-07 DIAGNOSIS — M0600 Rheumatoid arthritis without rheumatoid factor, unspecified site: Principal | ICD-10-CM

## 2018-04-08 ENCOUNTER — Other Ambulatory Visit (HOSPITAL_COMMUNITY)
Admission: RE | Admit: 2018-04-08 | Discharge: 2018-04-08 | Disposition: A | Discharge status: Home / Self Care | Payer: Self-pay | Source: Ambulatory Visit | Attending: Physician Assistant | Admitting: Physician Assistant

## 2018-04-08 ENCOUNTER — Encounter: Payer: Self-pay | Admitting: Physician Assistant

## 2018-04-08 ENCOUNTER — Ambulatory Visit (INDEPENDENT_AMBULATORY_CARE_PROVIDER_SITE_OTHER): Payer: Self-pay | Admitting: Physician Assistant

## 2018-04-08 VITALS — BP 113/75 | HR 83 | Ht 67.0 in | Wt 168.0 lb

## 2018-04-08 DIAGNOSIS — F314 Bipolar disorder, current episode depressed, severe, without psychotic features: Secondary | ICD-10-CM

## 2018-04-08 DIAGNOSIS — Z13220 Encounter for screening for lipoid disorders: Secondary | ICD-10-CM

## 2018-04-08 DIAGNOSIS — R7301 Impaired fasting glucose: Secondary | ICD-10-CM

## 2018-04-08 DIAGNOSIS — Z124 Encounter for screening for malignant neoplasm of cervix: Secondary | ICD-10-CM | POA: Insufficient documentation

## 2018-04-08 DIAGNOSIS — Z1231 Encounter for screening mammogram for malignant neoplasm of breast: Secondary | ICD-10-CM

## 2018-04-08 DIAGNOSIS — F603 Borderline personality disorder: Secondary | ICD-10-CM

## 2018-04-08 DIAGNOSIS — Z131 Encounter for screening for diabetes mellitus: Secondary | ICD-10-CM

## 2018-04-08 NOTE — Progress Notes (Signed)
Subjective:    Patient ID: Frances [name], female    DOB: 1976-12-10, 42 y.o.   MRN: [id number]  HPI  Pt is a 42 yo female who presents to the clinic for her annual gynecological exam and pap. She has had one abnormal pap in her 20's and had cryotherapy. No other abnormal pap. She is not currently sexually active. She denies any abnormal discharge or vaginal bleeding. She had her mirena IUD taking out a few months ago and periods have come back.   She is on some new medications for her mood. She feels like they are working fairly well. She is being managed by [employer]   .. Active Ambulatory Problems    Diagnosis Date Noted  . FEVER, RECURRENT 04/02/2009  . HYPOTHYROIDISM 02/10/2008  . 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 DERMATITIS, ALLERGIC 06/05/2008  . 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 FIBROMYALGIA 02/10/2008  . FATIGUE 05/05/2008  . IRON, SERUM, ELEVATED 02/10/2008  . DYSPNEA 10/15/2011  . Bipolar I disorder ([place]) 09/16/2017  . Fibromyalgia 09/16/2017  . Chronic fatigue syndrome 09/16/2017  . MTHFR gene mutation ([place]) 09/20/2017  . TMJ (temporomandibular joint syndrome) 09/26/2017  . New daily persistent headache 09/26/2017  . Rheumatoid arthritis 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 ([place]) 12/09/2017  . Anxiety 01/22/2018  . Borderline personality disorder ([place]) 01/22/2018  . Depression   . Bipolar 1 disorder, depressed, severe ([place]) 01/25/2018  . Elevated fasting glucose 04/08/2018   Resolved Ambulatory Problems    Diagnosis Date Noted  . SINUSITIS- ACUTE-NOS 03/24/2008  . URI 01/17/2009  . ANKLE PAIN, BILATERAL 07/28/2008  . BACK PAIN 10/13/2008  . OPEN WOUND FT NO TOE ALONE WITHOUT MENTION COMP 03/28/2009   Past Medical History:  Diagnosis Date  . Anxiety   . Arthritis   . Back pain, chronic   . Bipolar 1 disorder ([place])   . Chronic fatigue syndrome   . DDD (degenerative disc disease), lumbar   .  Depression   . Fibromyalgia   . IUD 2012  . MTHFR gene mutation [facility])        Review of Systems  All other systems reviewed and are negative.      Objective:   Physical Exam  Constitutional: She is oriented to person, place, and time. She appears well-developed and well-nourished.  HENT:  Head: Normocephalic and atraumatic.  Eyes: Conjunctivae are normal.  Neck: Normal range of motion. Neck supple.  Cardiovascular: Normal rate, regular rhythm and normal heart sounds.  Pulmonary/Chest: Effort normal and breath sounds normal.  Abdominal: Soft. Bowel sounds are normal. She exhibits no distension and no mass. There is no tenderness. There is no rebound and no guarding.  Genitourinary: Vagina normal. No vaginal discharge found.  Genitourinary Comments: No adnexal tenderness.  No polyps or masses on cervical os.   Neurological: She is alert and oriented to person, place, and time.  Skin: Skin is dry.  Psychiatric: She has a normal mood and affect. Her behavior is normal.          Assessment & Plan:  Marland KitchenMarland KitchenDiagnoses and all orders for this visit:  Routine Papanicolaou smear -     Cytology - PAP  Screening for lipid disorders -     Lipid Panel w/reflex Direct LDL  Screening for diabetes mellitus -     COMPLETE METABOLIC PANEL WITH GFR  Elevated fasting glucose -     Hemoglobin A1c  Visit for screening mammogram -  MM 3D SCREEN BREAST BILATERAL  Bipolar 1 disorder, depressed, severe (HCC)  Borderline personality disorder ([place])   .[name] Depression screen [facility] 2/9 04/08/2018 11/03/2017 09/16/2017  Decreased Interest 3 2 3   Down, Depressed, Hopeless 2 1 3   PHQ - 2 Score 5 3 6   Altered sleeping 2 2 3   Tired, decreased energy 3 3 2   Change in appetite 0 1 2  Feeling bad or failure about yourself  1 2 3   Trouble concentrating 1 1 2   Moving slowly or fidgety/restless 0 0 1  Suicidal thoughts 1 1 3   PHQ-9 Score 13 13 22   Difficult doing work/chores Very difficult  Somewhat difficult -   .. GAD 7 : Generalized Anxiety Score 04/08/2018 11/03/2017  Nervous, Anxious, on Edge 1 1  Control/stop worrying 1 1  Worry too much - different things 2 1  Trouble relaxing 1 2  Restless 1 1  Easily annoyed or irritable 1 2  Afraid - awful might happen 0 0  Total GAD 7 Score 7 8  Anxiety Difficulty Somewhat difficult -    .[name] Discussed 150 minutes of exercise a week.  Encouraged vitamin D 1000 units and Calcium 1300mg  or 4 servings of dairy a day.  Pap done today. Declined STD testings. Ordered mammogram.  Fasting labs ordered.   MOod managed by [facility]. Seems to be improving and current treatment helping.

## 2018-04-08 NOTE — Patient Instructions (Signed)

## 2018-04-14 LAB — CYTOLOGY - PAP
Diagnosis: NEGATIVE
HPV: NOT DETECTED

## 2018-04-14 MED ORDER — HYDROXYCHLOROQUINE 200 MG TABLET
ORAL_TABLET | Freq: Two times a day (BID) | ORAL | 0 refills | 0.00000 days | Status: CP
Start: 2018-04-14 — End: 2018-04-14

## 2018-04-14 MED ORDER — HYDROXYCHLOROQUINE 200 MG TABLET: 200 mg | tablet | Freq: Two times a day (BID) | 2 refills | 0 days | Status: AC

## 2018-04-14 NOTE — Progress Notes (Signed)
Pap smear was normal and HPV testing was negative. Very low risk for cervical cancer, but I would recommend repeat screening yearly due to Methotrexate use.

## 2018-04-15 LAB — COMPLETE METABOLIC PANEL WITH GFR
AG Ratio: 1.8 (calc) (ref 1.0–2.5)
ALT: 19 U/L (ref 6–29)
AST: 15 U/L (ref 10–30)
Albumin: 4.2 g/dL (ref 3.6–5.1)
Alkaline phosphatase (APISO): 60 U/L (ref 33–115)
BUN: 13 mg/dL (ref 7–25)
CO2: 28 mmol/L (ref 20–32)
Calcium: 9.3 mg/dL (ref 8.6–10.2)
Chloride: 106 mmol/L (ref 98–110)
Creat: 0.76 mg/dL (ref 0.50–1.10)
GFR, Est African American: 111 mL/min/{1.73_m2} (ref >=60)
GFR, Est Non African American: 95 mL/min/{1.73_m2} (ref >=60)
Globulin: 2.3 g/dL (calc) (ref 1.9–3.7)
Glucose, Bld: 105 mg/dL — ABNORMAL HIGH (ref 65–99)
Potassium: 4.2 mmol/L (ref 3.5–5.3)
Sodium: 141 mmol/L (ref 135–146)
Total Bilirubin: 0.8 mg/dL (ref 0.2–1.2)
Total Protein: 6.5 g/dL (ref 6.1–8.1)

## 2018-04-15 LAB — LIPID PANEL W/REFLEX DIRECT LDL
Cholesterol: 130 mg/dL (ref <200)
HDL: 28 mg/dL — ABNORMAL LOW (ref >50)
LDL Cholesterol (Calc): 73 mg/dL (calc)
Non-HDL Cholesterol (Calc): 102 mg/dL (calc) (ref <130)
Total CHOL/HDL Ratio: 4.6 (calc) (ref <5.0)
Triglycerides: 192 mg/dL — ABNORMAL HIGH (ref <150)

## 2018-04-15 LAB — HEMOGLOBIN A1C
Hgb A1c MFr Bld: 5.2 % of total Hgb (ref <5.7)
Mean Plasma Glucose: 103 (calc)
eAG (mmol/L): 5.7 (calc)

## 2018-04-21 MED FILL — HYDROXYCHLOROQUINE SULFAT/200MG/TABS: HYDROXYCHLOROQUINE SULFAT/200MG/TABS | 90 days supply | Qty: 180 | Fill #0

## 2018-04-26 ENCOUNTER — Encounter: Payer: Self-pay | Admitting: Physician Assistant

## 2018-04-28 ENCOUNTER — Ambulatory Visit: Payer: Self-pay

## 2018-05-05 ENCOUNTER — Ambulatory Visit (INDEPENDENT_AMBULATORY_CARE_PROVIDER_SITE_OTHER): Payer: Self-pay

## 2018-05-05 DIAGNOSIS — Z1231 Encounter for screening mammogram for malignant neoplasm of breast: Secondary | ICD-10-CM

## 2018-05-05 NOTE — Progress Notes (Signed)
Normal mammogram

## 2018-06-07 ENCOUNTER — Other Ambulatory Visit (HOSPITAL_COMMUNITY): Payer: Self-pay | Admitting: Specialist

## 2018-06-07 ENCOUNTER — Other Ambulatory Visit (HOSPITAL_COMMUNITY): Payer: Self-pay | Admitting: Orthopedic Surgery

## 2018-06-07 DIAGNOSIS — M25551 Pain in right hip: Secondary | ICD-10-CM

## 2018-06-09 NOTE — Telephone Encounter (Signed)
This was completed in Feb 2019. Results are in chart.

## 2018-06-15 ENCOUNTER — Ambulatory Visit (HOSPITAL_COMMUNITY)
Admission: RE | Admit: 2018-06-15 | Discharge: 2018-06-15 | Disposition: A | Discharge status: Home / Self Care | Payer: Self-pay | Source: Ambulatory Visit | Attending: Orthopedic Surgery | Admitting: Orthopedic Surgery

## 2018-06-15 DIAGNOSIS — M25551 Pain in right hip: Secondary | ICD-10-CM | POA: Insufficient documentation

## 2018-06-15 DIAGNOSIS — S73191A Other sprain of right hip, initial encounter: Secondary | ICD-10-CM | POA: Insufficient documentation

## 2018-06-15 MED ORDER — LIDOCAINE HCL 1 % IJ SOLN
INTRAMUSCULAR | Status: AC
  Administered 2018-06-15: 3 mL
  Filled 2018-06-15: qty 20

## 2018-06-15 MED ORDER — IOPAMIDOL (ISOVUE-300) INJECTION 61%
INTRAVENOUS | Status: AC
  Administered 2018-06-15: 13:00:00
  Filled 2018-06-15: qty 50

## 2018-06-15 MED ORDER — GADOBENATE DIMEGLUMINE 529 MG/ML IV SOLN
5.0000 mL | Freq: Once | INTRAVENOUS | Status: AC | PRN
  Administered 2018-06-15: 5 mL via INTRAVENOUS

## 2018-07-02 ENCOUNTER — Ambulatory Visit: Admit: 2018-07-02 | Discharge: 2018-07-03 | Attending: Family Medicine | Primary: Family Medicine

## 2018-07-02 DIAGNOSIS — M1611 Unilateral primary osteoarthritis, right hip: Principal | ICD-10-CM

## 2018-07-19 ENCOUNTER — Other Ambulatory Visit: Payer: Self-pay | Admitting: Physician Assistant

## 2018-07-19 MED ORDER — HYDROXYCHLOROQUINE 200 MG TABLET
ORAL_TABLET | Freq: Two times a day (BID) | ORAL | 2 refills | 0.00000 days | Status: CP
Start: 2018-07-19 — End: 2018-07-19
  Filled 2018-08-24: qty 120, 60d supply, fill #0

## 2018-07-19 MED ORDER — HYDROXYCHLOROQUINE 200 MG TABLET: 200 mg | tablet | 2 refills | 0 days

## 2018-07-20 MED FILL — HYDROXYCHLOROQUINE SULFAT/200MG/TABS: HYDROXYCHLOROQUINE SULFAT/200MG/TABS | 30 days supply | Qty: 60 | Fill #0

## 2018-07-23 ENCOUNTER — Ambulatory Visit: Admit: 2018-07-23 | Discharge: 2018-07-24 | Attending: Family Medicine | Primary: Family Medicine

## 2018-07-23 DIAGNOSIS — M1611 Unilateral primary osteoarthritis, right hip: Principal | ICD-10-CM

## 2018-08-09 ENCOUNTER — Ambulatory Visit: Admit: 2018-08-09 | Discharge: 2018-08-09

## 2018-08-09 DIAGNOSIS — M1990 Unspecified osteoarthritis, unspecified site: Secondary | ICD-10-CM

## 2018-08-10 ENCOUNTER — Ambulatory Visit: Admit: 2018-08-10 | Discharge: 2018-08-11

## 2018-08-10 DIAGNOSIS — M7611 Psoas tendinitis, right hip: Principal | ICD-10-CM

## 2018-08-10 DIAGNOSIS — M25551 Pain in right hip: Secondary | ICD-10-CM

## 2018-08-10 DIAGNOSIS — G8929 Other chronic pain: Secondary | ICD-10-CM

## 2018-08-11 ENCOUNTER — Ambulatory Visit: Admit: 2018-08-11 | Discharge: 2018-08-12 | Attending: Family Medicine | Primary: Family Medicine

## 2018-08-11 DIAGNOSIS — M7611 Psoas tendinitis, right hip: Principal | ICD-10-CM

## 2018-08-11 MED ORDER — PREDNISONE 10 MG TABLET
ORAL_TABLET | Freq: Every day | ORAL | 0 refills | 0.00000 days | Status: CP
Start: 2018-08-11 — End: 2018-08-26
  Filled 2018-08-12: qty 14, 14d supply, fill #0

## 2018-08-12 ENCOUNTER — Ambulatory Visit: Admit: 2018-08-12 | Discharge: 2018-08-13

## 2018-08-12 MED FILL — PREDNISONE 10 MG TABLET: 14 days supply | Qty: 14 | Fill #0 | Status: AC

## 2018-08-16 ENCOUNTER — Other Ambulatory Visit: Payer: Self-pay | Admitting: Family Medicine

## 2018-08-16 ENCOUNTER — Encounter: Payer: Self-pay | Admitting: Rehabilitative and Restorative Service Providers"

## 2018-08-16 ENCOUNTER — Ambulatory Visit (INDEPENDENT_AMBULATORY_CARE_PROVIDER_SITE_OTHER): Payer: Self-pay | Admitting: Rehabilitative and Restorative Service Providers"

## 2018-08-16 DIAGNOSIS — M25551 Pain in right hip: Secondary | ICD-10-CM

## 2018-08-16 DIAGNOSIS — M6281 Muscle weakness (generalized): Secondary | ICD-10-CM

## 2018-08-16 DIAGNOSIS — R29898 Other symptoms and signs involving the musculoskeletal system: Secondary | ICD-10-CM

## 2018-08-16 NOTE — Therapy (Signed)
[place] [address] [place], [place], [address] Phone: [phone]   Fax:  [phone]  Physical Therapy Evaluation  Patient Details  Name: Frances [name] MRN: [id number] Date of Birth: 10/11/1976 Referring Provider: Dr Henry Russel    Encounter Date: 08/16/2018  PT End of Session - 08/16/18 1250    Visit Number  1    Number of Visits  12    Date for PT Re-Evaluation  09/27/18    PT Start Time  1152    PT Stop Time  1253    PT Time Calculation (min)  61 min       Past Medical History:  Diagnosis Date  . Anxiety   . Arthritis   . Back pain, chronic   . Bipolar 1 disorder ([place])   . Chronic fatigue syndrome   . DDD (degenerative disc disease), lumbar   . Depression   . Fibromyalgia   . IUD 2012   Mirena  . MTHFR gene mutation [facility])     Past Surgical History:  Procedure Laterality Date  . COLONOSCOPY WITH PROPOFOL N/A 02/25/2017   Procedure: COLONOSCOPY WITH PROPOFOL;  Surgeon: Arta Silence, MD;  Location: [facility];  Service: Endoscopy;  Laterality: N/A;  . left knee lateral release  1993  . scar tisue removal  03/2005   left ankle  . TONSILLECTOMY  age 15's    There were no vitals filed for this visit.   Subjective Assessment - 08/16/18 1155    Subjective  [name] reports that she has had Rt hip pain on and off for years but symptoms have increased in the past year especially in the past 3-4 months. She does not know of any recent injury.     Pertinent History  riding injury ~ 20 yrs ago when jumping her pony - jarred Rt hip with some intermittent symptoms since that time; DDD and HNP lumbar and cervical spine; fibromypagia; RA; anxiety; depression      Diagnostic tests  xrays (-) labral tear and spurs Rt hip    Patient Stated Goals  try to get the hip feeling better     Currently in Pain?  Yes    Pain Score  2     Pain Location  Hip    Pain Orientation  Right    Pain Descriptors /  Indicators  Aching    Pain Type  Chronic pain    Pain Radiating Towards  into the posterior/lateral thigh on an intermittent basis     Pain Onset  More than a month ago    Pain Frequency  Constant    Aggravating Factors   flexing hip; standing; walking; stairs     Pain Relieving Factors  injection; meds         [facility] PT Assessment - 08/16/18 0001      Assessment   Medical Diagnosis  Rt hip pain     Referring Provider  Dr Henry Russel     Onset Date/Surgical Date  03/22/18   worse in the past 3-4 months present for > 1 year    Hand Dominance  Right    Next MD Visit  to schedule     Prior Therapy  none       Precautions   Precautions  None      Balance Screen   Has the patient fallen in the past 6 months  No    Has the patient had a decrease in activity  level because of a fear of falling?   No    Is the patient reluctant to leave their home because of a fear of falling?   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     Leisure  cares for her horse; some household chores; walking 5 min ~ 4 times/wk       Observation/Other Assessments   Focus on Therapeutic Outcomes (FOTO)   43% limitation       Sensation   Additional Comments  numbness - dorsum of Rt foot on an intermittent basis resolves on its own 1-2 episodes/wk       Posture/Postural Control   Posture Comments  head forward; shoulders rounded; flexed forward at hips; wt shifted to the Lt in standing; sits with LE's crossed       AROM   Lumbar Flexion  100%    Lumbar Extension  40% tightness    Lumbar - Right Side Bend  75% tight    Lumbar - Left Side Bend  70% tight    Lumbar - Right Rotation  30% tight     Lumbar - Left Rotation  30% tight       Strength   Right Hip Flexion  4+/5    Right Hip Extension  4/5    Right Hip  ABduction  4+/5    Right Hip ADduction  4/5    Left Hip Flexion  --   5-/5   Left Hip Extension  5/5    Left Hip ABduction  5/5    Left Hip ADduction  5/5      Flexibility   Hamstrings  WFL's bilat     Quadriceps  tight Rt 100 deg; Lt 110 deg testing in prone     Piriformis  tigth Rt       Palpation   Spinal mobility  hypomobile lumbar spine with CPA mobs     Palpation comment  ignificant muscular tightness Rt psoas and hip flexor musculature; piriformis; glut med/min - minimal tightness Lt compared to Rt       Special Tests   Other special tests  (-) SLR; [place] tight on Rt no radicular symptoms                 Objective measurements completed on examination: See above findings.      [facility] Adult PT Treatment/Exercise - 08/16/18 0001      Self-Care   Self-Care  --   initiated back care education      Lumbar Exercises: Stretches   Hip Flexor Stretch  Right;Left;2 reps;30 seconds   seated    Quad Stretch  Right;Left;2 reps;30 seconds   prone with strap    Piriformis Stretch  Right;2 reps;30 seconds   supine Rt foot resting onLt thigh-counterpressure Rt flexors     Lumbar Exercises: Supine   Other Supine Lumbar Exercises  3 part core 10 sec x 10 reps       Moist Heat Therapy   Number Minutes Moist Heat  15 Minutes    Moist Heat Location  Lumbar Spine;Hip   Rt anterior hip      Electrical Stimulation   Electrical Stimulation Location  Rt piriformis/hip abductors; Rt anterior hip  Electrical Stimulation Action  IFC    Electrical Stimulation Parameters  to tolerance    Electrical Stimulation Goals  Pain;Tone             PT Education - 08/16/18 1235    Education Details  HEP back care handout     Person(s) Educated  Patient    Methods  Explanation;Demonstration;Tactile cues;Verbal cues;Handout    Comprehension  Verbalized understanding;Returned demonstration;Verbal cues required;Tactile cues required          PT Long Term Goals - 08/16/18  1339      PT LONG TERM GOAL #1   Title  Decrease pain in Rt hip 50-75% allowing patient to perfrom normal functional activities with greater ease 09/27/18    Time  6    Period  Weeks    Status  New      PT LONG TERM GOAL #2   Title  Increase functional strength Rt LE to 5-/5 to 5/5 throughout 09/27/18    Time  6    Period  Weeks      PT LONG TERM GOAL #3   Title  Patient reports tolerating 10-15 min of walking on level surfaces 2-3 times/week 09/27/18    Time  6    Period  Weeks    Status  New      PT LONG TERM GOAL #4   Title  Independent in HEP 09/27/18    Time  6    Period  Weeks      PT LONG TERM GOAL #5   Title  Iprove FOTO to </= 35% limitation 09/27/18    Time  6    Period  Weeks    Status  New             Plan - 08/16/18 1251    Clinical Impression Statement  Patient presents with chronic Rt hip pain with increase in symptoms in the past 3-4 weeks. She has pain with Rt hip flexion; muscular tightness through anterior hip/hip flexors/posterior hip/piriformis;hip abductors; weakness Rt LE; pain with functional activities. Patient will benefit form PT to address problems identified.    History and Personal Factors relevant to plan of care:  intermittent pain over the past 20 years; increased in the past year    Clinical Presentation  Evolving    Clinical Presentation due to:  chronic nature of pain     Clinical Decision Making  Moderate    Rehab Potential  Good    PT Frequency  2x / week    PT Duration  6 weeks    PT Treatment/Interventions  Patient/family education;ADLs/Self Care Home Management;Cryotherapy;Electrical Stimulation;Iontophoresis 4mg /ml Dexamethasone;Moist Heat;Ultrasound;Dry needling;Manual techniques;Neuromuscular re-education;Therapeutic activities;Therapeutic exercise       Patient will benefit from skilled therapeutic intervention in order to improve the following deficits and impairments:  Postural dysfunction, Improper body mechanics, Pain,  Increased fascial restricitons, Increased muscle spasms, Hypomobility, Decreased strength, Decreased mobility, Decreased range of motion, Decreased activity tolerance  Visit Diagnosis: Pain in right hip - Plan: PT plan of care cert/re-cert  Other symptoms and signs involving the musculoskeletal system - Plan: PT plan of care cert/re-cert  Muscle weakness (generalized) - Plan: PT plan of care cert/re-cert     Problem List Patient Active Problem List   Diagnosis Date Noted  . Elevated fasting glucose 04/08/2018  . Bipolar 1 disorder, depressed, severe ([place]) 01/25/2018  . Depression   . Anxiety 01/22/2018  . Borderline personality disorder ([place]) 01/22/2018  . Rheumatoid arthritis of  multiple sites 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 ([place]) 12/09/2017  . TMJ (temporomandibular joint syndrome) 09/26/2017  . New daily persistent headache 09/26/2017  . MTHFR gene mutation ([place]) 09/20/2017  . Bipolar I disorder ([place]) 09/16/2017  . Fibromyalgia 09/16/2017  . Chronic fatigue syndrome 09/16/2017  . DYSPNEA 10/15/2011  . FEVER, RECURRENT 04/02/2009  . DERMATITIS, ALLERGIC 06/05/2008  . FATIGUE 05/05/2008  . HYPOTHYROIDISM 02/10/2008  . 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 FIBROMYALGIA 02/10/2008  . IRON, SERUM, ELEVATED 02/10/2008    Plato [place]spaugh Nilda Simmer PT, MPH  08/16/2018, 1:45 PM  [facility] [place] [address] [place], [place], [address] Phone: [phone]   Fax:  [phone]  Name: NAVREET BOLDA MRN: [id number] Date of Birth: [birth year]

## 2018-08-16 NOTE — Patient Instructions (Signed)
Abdominal Bracing With Pelvic Floor (Hook-Lying)    With neutral spine, tighten pelvic floor and abdominals sucking belly button to back bone; tighten muscles in the low back at waist. Hold 10 sec  Repeat _10__ times. Do _several __ times a day. Progress to do this exercise in sitting; standing; walking and with functional activities .   Piriformis Stretch   Lying on back, pull right knee toward opposite shoulder. Hold 30 seconds. Repeat 3 times. Do 2-3 sessions per day.   Quads / HF, Prone KNEE: Quadriceps - Prone    Place strap around ankle. Bring ankle toward buttocks. Press hip into surface. Hold 30 seconds. Repeat 3 times per session. Do 2-3 sessions per day.     Sleeping on Back  Place pillow under knees. A pillow with cervical support and a roll around waist are also helpful. Copyright  VHI. All rights reserved.  Sleeping on Side Place pillow between knees. Use cervical support under neck and a roll around waist as needed. Copyright  VHI. All rights reserved.   Sleeping on Stomach   If this is the only desirable sleeping position, place pillow under lower legs, and under stomach or chest as needed.  Posture - Sitting   Sit upright, head facing forward. Try using a roll to support lower back. Keep shoulders relaxed, and avoid rounded back. Keep hips level with knees. Avoid crossing legs for long periods. Stand to Sit / Sit to Stand   To sit: Bend knees to lower self onto front edge of chair, then scoot back on seat. To stand: Reverse sequence by placing one foot forward, and scoot to front of seat. Use rocking motion to stand up.   Work Height and Reach  Ideal work height is no more than 2 to 4 inches below elbow level when standing, and at elbow level when sitting. Reaching should be limited to arm's length, with elbows slightly bent.  Bending  Bend at hips and knees, not back. Keep feet shoulder-width apart.    Posture - Standing   Good posture is  important. Avoid slouching and forward head thrust. Maintain curve in low back and align ears over shoul- ders, hips over ankles.  Alternating Positions   Alternate tasks and change positions frequently to reduce fatigue and muscle tension. Take rest breaks. Computer Work   Position work to Programmer, multimedia. Use proper work and seat height. Keep shoulders back and down, wrists straight, and elbows at right angles. Use chair that provides full back support. Add footrest and lumbar roll as needed.  Getting Into / Out of Car  Lower self onto seat, scoot back, then bring in one leg at a time. Reverse sequence to get out.  Dressing  Lie on back to pull socks or slacks over feet, or sit and bend leg while keeping back straight.    Housework - Sink  Place one foot on ledge of cabinet under sink when standing at sink for prolonged periods.   Pushing / Pulling  Pushing is preferable to pulling. Keep back in proper alignment, and use leg muscles to do the work.  Deep Squat   Squat and lift with both arms held against upper trunk. Tighten stomach muscles without holding breath. Use smooth movements to avoid jerking.  Avoid Twisting   Avoid twisting or bending back. Pivot around using foot movements, and bend at knees if needed when reaching for articles.  Carrying Luggage   Distribute weight evenly on both sides. Use a cart  whenever possible. Do not twist trunk. Move body as a unit.   Lifting Principles .Maintain proper posture and head alignment. .Slide object as close as possible before lifting. .Move obstacles out of the way. .Test before lifting; ask for help if too heavy. .Tighten stomach muscles without holding breath. .Use smooth movements; do not jerk. .Use legs to do the work, and pivot with feet. .Distribute the work load symmetrically and close to the center of trunk. .Push instead of pull whenever possible.   Ask For Help   Ask for help and delegate to others  when possible. Coordinate your movements when lifting together, and maintain the low back curve.  Log Roll   Lying on back, bend left knee and place left arm across chest. Roll all in one movement to the right. Reverse to roll to the left. Always move as one unit. Housework - Sweeping  Use long-handled equipment to avoid stooping.   Housework - Wiping  Position yourself as close as possible to reach work surface. Avoid straining your back.  Laundry - Unloading Wash   To unload small items at bottom of washer, lift leg opposite to arm being used to reach.  [place] close to area to be raked. Use arm movements to do the work. Keep back straight and avoid twisting.     Cart  When reaching into cart with one arm, lift opposite leg to keep back straight.   Getting Into / Out of Bed  Lower self to lie down on one side by raising legs and lowering head at the same time. Use arms to assist moving without twisting. Bend both knees to roll onto back if desired. To sit up, start from lying on side, and use same move-ments in reverse. Housework - Vacuuming  Hold the vacuum with arm held at side. Step back and forth to move it, keeping head up. Avoid twisting.   Laundry - IT consultant so that bending and twisting can be avoided.   Laundry - Unloading Dryer  Squat down to reach into clothes dryer or use a reacher.  Gardening - Weeding / Probation officer or Kneel. Knee pads may be helpful.

## 2018-08-19 ENCOUNTER — Ambulatory Visit (INDEPENDENT_AMBULATORY_CARE_PROVIDER_SITE_OTHER): Payer: Self-pay | Admitting: Physical Therapy

## 2018-08-19 DIAGNOSIS — M6281 Muscle weakness (generalized): Secondary | ICD-10-CM

## 2018-08-19 DIAGNOSIS — R29898 Other symptoms and signs involving the musculoskeletal system: Secondary | ICD-10-CM

## 2018-08-19 DIAGNOSIS — M25551 Pain in right hip: Secondary | ICD-10-CM

## 2018-08-19 NOTE — Therapy (Addendum)
[place], [place], [address] Phone: [phone]   Fax:  [phone]  Physical Therapy Treatment  Patient Details  Name: Frances [name] MRN: [id number] Date of Birth: 1976/01/02 Referring Provider: Dr. Henry Russel   Encounter Date: 08/19/2018  PT End of Session - 08/19/18 1723    Visit Number  2    Number of Visits  12    Date for PT Re-Evaluation  09/27/18    PT Start Time  1615    PT Stop Time  9741    PT Time Calculation (min)  60 min    Activity Tolerance  Patient tolerated treatment well    Behavior During Therapy  [facility] for tasks assessed/performed       Past Medical History:  Diagnosis Date  . Anxiety   . Arthritis   . Back pain, chronic   . Bipolar 1 disorder ([place])   . Chronic fatigue syndrome   . DDD (degenerative disc disease), lumbar   . Depression   . Fibromyalgia   . IUD 2012   Mirena  . MTHFR gene mutation [facility])     Past Surgical History:  Procedure Laterality Date  . COLONOSCOPY WITH PROPOFOL N/A 02/25/2017   Procedure: COLONOSCOPY WITH PROPOFOL;  Surgeon: Arta Silence, MD;  [facility];  Service: Endoscopy;  Laterality: N/A;  . left knee lateral release  1993  . scar tisue removal  03/2005   left ankle  . TONSILLECTOMY  age 18's    There were no vitals filed for this visit.  Subjective Assessment - 08/19/18 1622    Subjective  Pt reports that her back was very sore from doing the exercises.  Otherwise no new changes.     Pain Score  3     Pain Orientation  Right;Anterior    Pain Descriptors / Indicators  Aching    Aggravating Factors   stairs, flexing hip    Pain Relieving Factors  injection, meds         [facility] PT Assessment - 08/19/18 0001      Assessment   Medical Diagnosis  Rt hip pain     Referring Provider  Dr. Henry Russel    Onset Date/Surgical Date  03/22/18   worse in the past 3-4 months present for > 1 year    Hand Dominance   Right    Next MD Visit  to schedule       Flexibility   Quadriceps  Rt 122 deg; Lt 122 deg       Palpation   SI assessment   Lt sacral torsion in prone; Rt ASIS lower than Lt.        [place] Adult PT Treatment/Exercise - 08/19/18 0001      Self-Care   Self-Care  Other Self-Care Comments    Other Self-Care Comments   Pt educated on self massage with ball to hip flexor and piriformis. Pt returned demo with VC.  Educated on anatomy of pelvis for better understanding of rationale of self massage and exercise.      Lumbar Exercises: Stretches   Passive Hamstring Stretch  Right;Left;1 rep;30 seconds    Hip Flexor Stretch  Right;Left;2 reps;30 seconds   seated    Quad Stretch  Right;Left;2 reps;30 seconds   prone with strap    Piriformis Stretch  Right;2 reps;30 seconds   supine Rt foot resting onLt thigh-counterpressure Rt flexors     Lumbar Exercises: Aerobic   Tread  Mill  1.8 mph x 5 min       Lumbar Exercises: Supine   Bridge  5 reps    Other Supine Lumbar Exercises  3 part core 10 sec x 10 reps ; butterfly stretch x 20 sec x 3 reps, with support under Rt leg.   Hip IR/ER stretch in hooklying x 10 reps      Moist Heat Therapy   Number Minutes Moist Heat  15 Minutes    Moist Heat Location  Lumbar Spine;Hip   Rt anterior hip      Manual Therapy   Manual Therapy  Muscle Energy Technique    Muscle Energy Technique  MET to correct ant Rt rotated ilium x 2 sets, MET to correct Lt sacral torsion              Electrical Stimulation   Electrical Stimulation Location  Rt glute/  Rt ant hip     Electrical Stimulation Action  premod to each area    Electrical Stimulation Parameters  to tolerance    Electrical Stimulation Goals  Pain             PT Long Term Goals - 08/16/18 1339      PT LONG TERM GOAL #1   Title  Decrease pain in Rt hip 50-75% allowing patient to perfrom normal functional activities with greater ease 09/27/18    Time  6    Period  Weeks    Status   New      PT LONG TERM GOAL #2   Title  Increase functional strength Rt LE to 5-/5 to 5/5 throughout 09/27/18    Time  6    Period  Weeks      PT LONG TERM GOAL #3   Title  Patient reports tolerating 10-15 min of walking on level surfaces 2-3 times/week 09/27/18    Time  6    Period  Weeks    Status  New      PT LONG TERM GOAL #4   Title  Independent in HEP 09/27/18    Time  6    Period  Weeks      PT LONG TERM GOAL #5   Title  Iprove FOTO to </= 35% limitation 09/27/18    Time  6    Period  Weeks    Status  New            Plan - 08/19/18 1707    Clinical Impression Statement  Pt has some pelvis asymmetries.  Minimal change noted with attempts of MET corrections.  Pt continues with tight hip flexors/ adductors/ Rt piriformis.  Pt tolerated treatment without increase in pain.      Rehab Potential  Good    PT Frequency  2x / week    PT Treatment/Interventions  Patient/family education;ADLs/Self Care Home Management;Cryotherapy;Electrical Stimulation;Iontophoresis 32m/ml Dexamethasone;Moist Heat;Ultrasound;Dry needling;Manual techniques;Neuromuscular re-education;Therapeutic activities;Therapeutic exercise    PT Next Visit Plan  assess pelvis alignment; manual therapy to adductors/ Rt piriformis and psoas.      Consulted and Agree with Plan of Care  Patient       Patient will benefit from skilled therapeutic intervention in order to improve the following deficits and impairments:  Postural dysfunction, Improper body mechanics, Pain, Increased fascial restricitons, Increased muscle spasms, Hypomobility, Decreased strength, Decreased mobility, Decreased range of motion, Decreased activity tolerance  Visit Diagnosis: Pain in right hip  Other symptoms and signs involving the musculoskeletal system  Muscle weakness (generalized)  Problem List Patient Active Problem List   Diagnosis Date Noted  . Elevated fasting glucose 04/08/2018  . Bipolar 1 disorder, depressed,  severe ([place]) 01/25/2018  . Depression   . Anxiety 01/22/2018  . Borderline personality disorder ([place]) 01/22/2018  . 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 TMJ (temporomandibular joint syndrome) 09/26/2017  . New daily persistent headache 09/26/2017  . MTHFR gene mutation ([place]) 09/20/2017  . Bipolar I disorder ([place]) 09/16/2017  . Fibromyalgia 09/16/2017  . Chronic fatigue syndrome 09/16/2017  . DYSPNEA 10/15/2011  . FEVER, RECURRENT 04/02/2009  . DERMATITIS, ALLERGIC 06/05/2008  . FATIGUE 05/05/2008  . HYPOTHYROIDISM 02/10/2008  . 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 FIBROMYALGIA 02/10/2008  . IRON, SERUM, ELEVATED 02/10/2008   Kerin Perna, PTA 08/19/18 6:09 PM [place], [place], [address] Phone: [phone]   Fax:  [phone]  Name: KOLEEN CELIA MRN: [id number] Date of Birth: [birth year]

## 2018-08-23 ENCOUNTER — Other Ambulatory Visit: Payer: Self-pay

## 2018-08-23 ENCOUNTER — Emergency Department (INDEPENDENT_AMBULATORY_CARE_PROVIDER_SITE_OTHER)
Admission: EM | Admit: 2018-08-23 | Discharge: 2018-08-23 | Disposition: A | Discharge status: Home / Self Care | Payer: Self-pay | Source: Home / Self Care | Attending: Family Medicine | Admitting: Family Medicine

## 2018-08-23 ENCOUNTER — Encounter: Payer: Self-pay | Admitting: Emergency Medicine

## 2018-08-23 DIAGNOSIS — H18821 Corneal disorder due to contact lens, right eye: Secondary | ICD-10-CM

## 2018-08-23 MED ORDER — GENTAMICIN SULFATE 0.3 % OP SOLN: 1.0000 [drp] | OPHTHALMIC | 0 refills | Status: DC

## 2018-08-23 NOTE — ED Provider Notes (Signed)
[name]    CSN: [id number] Arrival date & time: 08/23/18  1001     History   Chief Complaint Chief Complaint  Patient presents with  . Eye Problem    HPI Frances [name] is a 42 y.o. female.   HPI  Frances [name] is a 42 y.o. female presenting to UC with c/o Right eye pain that woke her from her sleep last night after she forgot to remove her contact lenses.  Pain is aching and sharp. Her eye has been watering some today and is red.  Denies recent URI symptoms. No known injury to her eye. She put some saline drops in her eye but no relief.   Past Medical History:  Diagnosis Date  . Anxiety   . Arthritis   . Back pain, chronic   . Bipolar 1 disorder (Frances [name])   . Chronic fatigue syndrome   . DDD (degenerative disc disease), lumbar   . Depression   . Fibromyalgia   . IUD 2012   Mirena  . MTHFR gene mutation Frances [name])     Patient Active Problem List   Diagnosis Date Noted  . Elevated fasting glucose 04/08/2018  . Bipolar 1 disorder, depressed, severe (Frances [name]) 01/25/2018  . Depression   . Anxiety 01/22/2018  . Borderline personality disorder (Frances [name]) 01/22/2018  . Rheumatoid arthritis 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 (Frances [name]) 12/09/2017  . TMJ (temporomandibular joint syndrome) 09/26/2017  . Frances daily persistent headache 09/26/2017  . MTHFR gene mutation (Frances [name]) 09/20/2017  . Bipolar I disorder (Frances [name]) 09/16/2017  . Fibromyalgia 09/16/2017  . Chronic fatigue syndrome 09/16/2017  . DYSPNEA 10/15/2011  . FEVER, RECURRENT 04/02/2009  . DERMATITIS, ALLERGIC 06/05/2008  . FATIGUE 05/05/2008  . HYPOTHYROIDISM 02/10/2008  . 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 FIBROMYALGIA 02/10/2008  . IRON, SERUM, ELEVATED 02/10/2008    Past Surgical History:  Procedure Laterality Date  . COLONOSCOPY WITH PROPOFOL N/A 02/25/2017   Procedure: COLONOSCOPY WITH PROPOFOL;  Surgeon: Arta Silence, MD;   [facility];  Service: Endoscopy;  Laterality: N/A;  . left knee lateral release  1993  . scar tisue removal  03/2005   left ankle  . TONSILLECTOMY  age 51's    OB History    Gravida  0   Para  0   Term  0   Preterm  0   AB  0   Living  0     SAB  0   TAB  0   Ectopic  0   Multiple  0   Live Births               Home Medications    Prior to Admission medications   Medication Sig Start Date End Date Taking? Authorizing Provider  AMBULATORY NON FORMULARY MEDICATION Take 1 tablet by mouth daily. Medication Name: Joint Supplement    [provider]  ARIPiprazole (ABILIFY) 10 MG tablet Take 1 tablet (10 mg total) by mouth daily. 02/02/18   Derrill Center, NP  benztropine (COGENTIN) 0.5 MG tablet Take 0.5 mg by mouth 2 (two) times daily.    [provider]  Cetirizine HCl (ZYRTEC PO) Take by mouth.    [provider]  Chelated Magnesium 100 MG TABS Take 2 tablets by mouth daily.    [provider]  Cyanocobalamin (B-12 PO) Take 800 mcg by mouth daily.    [provider]  folic acid (FOLVITE)  800 MCG tablet Take 1 tablet (800 mcg total) by mouth daily. Patient not taking: Reported on 08/16/2018 02/02/18   Derrill Center, NP  gentamicin (GARAMYCIN) 0.3 % ophthalmic solution Place 1 drop into the right eye every 4 (four) hours for 7 days. 08/23/18 08/30/18  Noe Gens, PA-C  hydrOXYzine (ATARAX/VISTARIL) 25 MG tablet Take 1 tablet (25 mg total) by mouth 3 (three) times daily as needed for anxiety. 02/01/18   Derrill Center, NP  leflunomide (ARAVA) 10 MG tablet Take by mouth. 02/08/18 02/08/19  [provider]  meloxicam (MOBIC) 15 MG tablet Take 1 tablet (15 mg total) by mouth daily. 01/12/18   Breeback, Royetta Car, PA-C  methotrexate (RHEUMATREX) 2.5 MG tablet Take 10 mg by mouth once a week. Caution:Chemotherapy. Protect from light.    [provider]  Mometasone Furoate (NASONEX NA) Place into the nose.     [provider]  montelukast (SINGULAIR) 10 MG tablet Take 1 tablet (10 mg total) by mouth at bedtime. 01/12/18   Breeback, Jade L, PA-C  NASONEX 50 MCG/ACT nasal spray USE 2 SPRAYS IN EACH NOSTRIL EVERY DAY 08/16/18   Breeback, Jade L, PA-C  Omega-3 Fatty Acids (FISH OIL PO) Take by mouth.    [provider]  predniSONE (DELTASONE) 10 MG tablet Take 10 mg by mouth daily with breakfast.    [provider]  PROVENTIL HFA 108 (90 Base) MCG/ACT inhaler INHALE 2 PUFFS BY MOUTH FOUR TIMES DAILY AS NEEDED FOR COUGHING, WHEEZING, OR SHORTNESS OF BREATH 08/20/17   [provider]  sertraline (ZOLOFT) 25 MG tablet Take 25 mg by mouth daily.    [provider]  traZODone (DESYREL) 50 MG tablet Take by mouth.    [provider]  vitamin C (ASCORBIC ACID) 500 MG tablet Take 500 mg by mouth daily.    [provider]    Family History Family History  Problem Relation Age of Onset  . Diabetes Mother   . Stroke Mother   . Lupus Mother   . Hypertension Mother   . Congestive Heart Failure Mother   . Mental illness Brother        Not clear what his diagnosis is--may be related to previous drug use.  . Cancer Maternal Grandmother        colon  . Depression Maternal Uncle   . Alcohol abuse Maternal Uncle   . Diabetes Maternal Grandfather     Social History Social History   Tobacco Use  . Smoking status: Never Smoker  . Smokeless tobacco: Never Used  Substance Use Topics  . Alcohol use: No    Alcohol/week: 0.0 standard drinks  . Drug use: No     Allergies   Metaxalone   Review of Systems Review of Systems  HENT: Negative for congestion and rhinorrhea.   Eyes: Positive for pain and redness. Negative for photophobia and visual disturbance.     Physical Exam Triage Vital Signs ED Triage Vitals  Enc Vitals Group     BP 08/23/18 1122 106/69     Pulse Rate 08/23/18 1122 79     Resp --      Temp 08/23/18 1122 98.4 F (36.9 C)      Temp Source 08/23/18 1122 Oral     SpO2 08/23/18 1122 98 %     Weight 08/23/18 1124 177 lb (80.3 kg)     Height 08/23/18 1124 5\' 7"  (1.702 m)     Head Circumference --  Peak Flow --      Pain Score 08/23/18 1123 4     Pain Loc --      Pain Edu? --      Excl. in [place]? --    No data found.  Updated Vital Signs BP 106/69 (BP Location: Right Arm)   Pulse 79   Temp 98.4 F (36.9 C) (Oral)   Ht 5\' 7"  (1.702 m)   Wt 177 lb (80.3 kg)   SpO2 98%   BMI 27.72 kg/m   Visual Acuity Right Eye Distance: 20/70 Left Eye Distance: 20/30 Bilateral Distance: 20/30(with correction)  Right Eye Near:   Left Eye Near:    Bilateral Near:     Physical Exam  Constitutional: She is oriented to person, place, and time. She appears well-developed and well-nourished. No distress.  HENT:  Head: Normocephalic and atraumatic.  Eyes: Pupils are equal, round, and reactive to light. EOM and lids are normal. Lids are everted and swept, no foreign bodies found. Right conjunctiva is injected.    Right eye: multiple small corneal abrasions. No foreign bodies noted. No discharge.   Neck: Normal range of motion.  Cardiovascular: Normal rate.  Pulmonary/Chest: Effort normal.  Musculoskeletal: Normal range of motion.  Neurological: She is alert and oriented to person, place, and time.  Skin: Skin is warm and dry. She is not diaphoretic.  Psychiatric: She has a normal mood and affect. Her behavior is normal.  Nursing note and vitals reviewed.    UC Treatments / Results  Labs (all labs ordered are listed, but only abnormal results are displayed) Labs Reviewed - No data to display  EKG None  Radiology No results found.  Procedures Procedures (including critical care time)  Medications Ordered in UC Medications - No data to display  Initial Impression / Assessment and Plan / UC Course  I have reviewed the triage vital signs and the nursing notes.  Pertinent labs & imaging results that  were available during my care of the patient were reviewed by me and considered in my medical decision making (see chart for details).    Hx and exam c/w corneal abrasion Home instructions provided.  Final Clinical Impressions(s) / UC Diagnoses   Final diagnoses:  Corneal abrasion due to contact lens, right     Discharge Instructions      Please avoid use of contacts until symptoms resolve. If not improving in 2-3 days or if symptoms worsening, please follow up with eye specialist from resource guide provided.    ED Prescriptions    Medication Sig Dispense Auth. Provider   gentamicin (GARAMYCIN) 0.3 % ophthalmic solution Place 1 drop into the right eye every 4 (four) hours for 7 days. 5 mL Noe Gens, PA-C     Controlled Substance Prescriptions [place] Controlled Substance Registry consulted? Not Applicable   Tyrell Antonio 08/23/18 1711

## 2018-08-23 NOTE — Discharge Instructions (Signed)
°  Please avoid use of contacts until symptoms resolve. If not improving in 2-3 days or if symptoms worsening, please follow up with eye specialist from resource guide provided.

## 2018-08-23 NOTE — ED Triage Notes (Signed)
Right eye redness and pain

## 2018-08-24 ENCOUNTER — Ambulatory Visit (INDEPENDENT_AMBULATORY_CARE_PROVIDER_SITE_OTHER): Payer: Self-pay | Admitting: Physical Therapy

## 2018-08-24 DIAGNOSIS — M6281 Muscle weakness (generalized): Secondary | ICD-10-CM

## 2018-08-24 DIAGNOSIS — R29898 Other symptoms and signs involving the musculoskeletal system: Secondary | ICD-10-CM

## 2018-08-24 DIAGNOSIS — M25551 Pain in right hip: Secondary | ICD-10-CM

## 2018-08-24 MED FILL — HYDROXYCHLOROQUINE 200 MG TABLET: 60 days supply | Qty: 120 | Fill #0 | Status: AC

## 2018-08-24 NOTE — Therapy (Signed)
[place], [place], [address] Phone: [phone]   Fax:  [phone]  Physical Therapy Treatment  Patient Details  Name: Frances [name] MRN: [id number] Date of Birth: 1976-10-01 Referring Provider: Dr. Henry Russel   Encounter Date: 08/24/2018  PT End of Session - 08/24/18 1109    Visit Number  3    Number of Visits  12    Date for PT Re-Evaluation  09/27/18    PT Start Time  1103    PT Stop Time  1200    PT Time Calculation (min)  57 min    Activity Tolerance  Patient tolerated treatment well;No increased pain    Behavior During Therapy  WFL for tasks assessed/performed       Past Medical History:  Diagnosis Date  . Anxiety   . Arthritis   . Back pain, chronic   . Bipolar 1 disorder ([place])   . Chronic fatigue syndrome   . DDD (degenerative disc disease), lumbar   . Depression   . Fibromyalgia   . IUD 2012   Mirena  . MTHFR gene mutation [facility])     Past Surgical History:  Procedure Laterality Date  . COLONOSCOPY WITH PROPOFOL N/A 02/25/2017   Procedure: COLONOSCOPY WITH PROPOFOL;  Surgeon: Arta Silence, MD;  [facility];  Service: Endoscopy;  Laterality: N/A;  . left knee lateral release  1993  . scar tisue removal  03/2005   left ankle  . TONSILLECTOMY  age 14's    There were no vitals filed for this visit.  Subjective Assessment - 08/24/18 1106    Subjective  "When I put my socks on I can actually get my leg up with less pain".   Pt reports she gets another injection in hip next week.  She has a corneal abrasion in her Rt eye, not sure how it happened.  She has 3 more days of Prednisone left of her 2 wk pack.     Currently in Pain?  Yes    Pain Score  1     Pain Location  Hip    Pain Orientation  Right    Pain Descriptors / Indicators  Dull    Aggravating Factors   flexing hip     Pain Relieving Factors  medication          [facility] PT Assessment - 08/24/18  0001      Assessment   Medical Diagnosis  Rt hip pain     Referring Provider  Dr. Henry Russel    Onset Date/Surgical Date  03/22/18   worse in the past 3-4 months present for > 1 year    Hand Dominance  Right    Next MD Visit  to schedule       Palpation   SI assessment   slight Lt sacral torsion in prone; Rt ASIS slightly lower than Lt.         [place] Adult PT Treatment/Exercise - 08/24/18 0001      Lumbar Exercises: Stretches   Passive Hamstring Stretch  Right;Left;30 seconds;2 reps   seated   Hip Flexor Stretch  Right;Left;2 reps;30 seconds   seated, arm overhead    Piriformis Stretch  Right;2 reps;30 seconds   seated   Other Lumbar Stretch Exercise  Rt kneeling hip flexor stretch with arm over head x 2; butterfly stretch bilat x 20 sec x 3 reps      Lumbar Exercises: Aerobic   Tread  Mill  1.5-1.6 mph, 5 min    PTA present to discuss progress     Lumbar Exercises: Supine   Bridge  10 reps;5 seconds   with ball squeeze   Other Supine Lumbar Exercises  3 part core 5 sec x 5 reps       Lumbar Exercises: Prone   Opposite Arm/Leg Raise  Right arm/Left leg;Left arm/Right leg;5 reps      Moist Heat Therapy   Number Minutes Moist Heat  15 Minutes    Moist Heat Location  Lumbar Spine;Hip   Rt anterior hip      Electrical Stimulation   Electrical Stimulation Location  Rt glute/  Rt ant hip     Electrical Stimulation Action  premod to each area    Electrical Stimulation Parameters  to tolerance    Electrical Stimulation Goals  Pain      Manual Therapy   Manual Therapy  Soft tissue mobilization    Soft tissue mobilization  STM to Rt TFL     Muscle Energy Technique  MET to correct ant Rt rotated ilium x 1 sets, MET to correct Lt sacral torsion              PT Education - 08/24/18 1210    Education Details  HEP     Person(s) Educated  Patient    Methods  Explanation;Handout;Demonstration;Verbal cues    Comprehension  Verbalized understanding;Returned  demonstration          PT Long Term Goals - 08/24/18 1216      PT LONG TERM GOAL #1   Title  Decrease pain in Rt hip 50-75% allowing patient to perfrom normal functional activities with greater ease 09/27/18    Time  6    Status  On-going      PT LONG TERM GOAL #2   Title  Increase functional strength Rt LE to 5-/5 to 5/5 throughout 09/27/18    Time  6    Period  Weeks    Status  On-going      PT LONG TERM GOAL #3   Title  Patient reports tolerating 10-15 min of walking on level surfaces 2-3 times/week 09/27/18    Time  6    Period  Weeks    Status  On-going      PT LONG TERM GOAL #4   Title  Independent in HEP 09/27/18    Time  6    Period  Weeks    Status  On-going      PT LONG TERM GOAL #5   Title  Iprove FOTO to </= 35% limitation 09/27/18    Time  6    Period  Weeks    Status  On-going            Plan - 08/24/18 1300    Clinical Impression Statement  Pt's pelvis alignment more symmetrical than last visit.  She responded well to MET correction today.  She was point tender in Rt TFL and glute insertion.  Pt tolerated all exercises well, with less reported pain.  Pt progressing towards goals.     Rehab Potential  Good    PT Frequency  2x / week    PT Duration  6 weeks    PT Treatment/Interventions  Patient/family education;ADLs/Self Care Home Management;Cryotherapy;Electrical Stimulation;Iontophoresis 48m/ml Dexamethasone;Moist Heat;Ultrasound;Dry needling;Manual techniques;Neuromuscular re-education;Therapeutic activities;Therapeutic exercise    PT Next Visit Plan  assess pelvis alignment; manual therapy to adductors/ Rt piriformis and psoas.  Consulted and Agree with Plan of Care  Patient       Patient will benefit from skilled therapeutic intervention in order to improve the following deficits and impairments:  Postural dysfunction, Improper body mechanics, Pain, Increased fascial restricitons, Increased muscle spasms, Hypomobility, Decreased strength,  Decreased mobility, Decreased range of motion, Decreased activity tolerance  Visit Diagnosis: Pain in right hip  Other symptoms and signs involving the musculoskeletal system  Muscle weakness (generalized)     Problem List Patient Active Problem List   Diagnosis Date Noted  . Elevated fasting glucose 04/08/2018  . Bipolar 1 disorder, depressed, severe ([employer]) 01/25/2018  . Depression   . Anxiety 01/22/2018  . Borderline personality disorder ([place]) 01/22/2018  . Rheumatoid arthritis 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 ([place]) 12/09/2017  . TMJ (temporomandibular joint syndrome) 09/26/2017  . New daily persistent headache 09/26/2017  . MTHFR gene mutation ([place]) 09/20/2017  . Bipolar I disorder ([place]) 09/16/2017  . Fibromyalgia 09/16/2017  . Chronic fatigue syndrome 09/16/2017  . DYSPNEA 10/15/2011  . FEVER, RECURRENT 04/02/2009  . DERMATITIS, ALLERGIC 06/05/2008  . FATIGUE 05/05/2008  . HYPOTHYROIDISM 02/10/2008  . 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 FIBROMYALGIA 02/10/2008  . IRON, SERUM, ELEVATED 02/10/2008   Kerin Perna, PTA 08/24/18 1:09 PM  [place], [place], [address] Phone: [phone]   Fax:  [phone]  Name: Frances [name] MRN: [id number] Date of Birth: 07-02-1976

## 2018-08-24 NOTE — Patient Instructions (Signed)
Access Code: [email or web address]  URL: [email or web address]  Date: 08/24/2018  Prepared by: Kerin Perna   Exercises  Seated Hamstring Stretch - 3 reps - 30 hold - 2x daily - 7x weekly  Seated Piriformis Stretch - 3 reps - 30 hold - 2x daily - 7x weekly  Supine Butterfly Groin Stretch - 10 reps - 3 sets - 1x daily - 7x weekly  Seated Hip Flexor Stretch - 3 reps - 30 hold - 2x daily - 7x weekly  Supine Bridge - 10 reps - 1-2 sets - 5 hold - 1x daily - 7x weekly

## 2018-08-25 MED ORDER — ADALIMUMAB 40 MG/0.8 ML SUBCUTANEOUS SYRINGE KIT
SUBCUTANEOUS | 4 refills | 0 days | Status: CP
Start: 2018-08-25 — End: 2018-10-06

## 2018-08-26 ENCOUNTER — Ambulatory Visit (INDEPENDENT_AMBULATORY_CARE_PROVIDER_SITE_OTHER): Payer: Self-pay | Admitting: Rehabilitative and Restorative Service Providers"

## 2018-08-26 ENCOUNTER — Telehealth: Payer: Self-pay

## 2018-08-26 ENCOUNTER — Encounter: Payer: Self-pay | Admitting: Rehabilitative and Restorative Service Providers"

## 2018-08-26 DIAGNOSIS — M6281 Muscle weakness (generalized): Secondary | ICD-10-CM

## 2018-08-26 DIAGNOSIS — M25551 Pain in right hip: Secondary | ICD-10-CM

## 2018-08-26 DIAGNOSIS — R29898 Other symptoms and signs involving the musculoskeletal system: Secondary | ICD-10-CM

## 2018-08-26 NOTE — Therapy (Signed)
[place], [place], [address] Phone: [phone]   Fax:  [phone]  Physical Therapy Treatment  Patient Details  Name: Frances [name] MRN: [id number] Date of Birth: [birth year] Referring Provider: Dr. Henry Russel   Encounter Date: 08/26/2018  PT End of Session - 08/26/18 1621    Visit Number  4    Number of Visits  12    Date for PT Re-Evaluation  09/27/18    PT Start Time  6270    PT Stop Time  3500    PT Time Calculation (min)  59 min    Activity Tolerance  Patient tolerated treatment well       Past Medical History:  Diagnosis Date  . Anxiety   . Arthritis   . Back pain, chronic   . Bipolar 1 disorder ([place])   . Chronic fatigue syndrome   . DDD (degenerative disc disease), lumbar   . Depression   . Fibromyalgia   . IUD 2012   Mirena  . MTHFR gene mutation [facility])     Past Surgical History:  Procedure Laterality Date  . COLONOSCOPY WITH PROPOFOL N/A 02/25/2017   Procedure: COLONOSCOPY WITH PROPOFOL;  Surgeon: Arta Silence, MD;  Location: [facility];  Service: Endoscopy;  Laterality: N/A;  . left knee lateral release  1993  . scar tisue removal  03/2005   left ankle  . TONSILLECTOMY  age 55's    There were no vitals filed for this visit.  Subjective Assessment - 08/26/18 1622    Subjective  patty reports that she is having some pain(just feels bruised) on the outside of the Rt hip and thigh. She has had this before - on and off     Currently in Pain?  Yes    Pain Score  2     Pain Location  Hip    Pain Orientation  Right    Pain Descriptors / Indicators  Dull    Pain Type  Chronic pain                       [facility] Adult PT Treatment/Exercise - 08/26/18 0001      Exercises   Exercises  Lumbar      Lumbar Exercises: Stretches   Passive Hamstring Stretch  Right;Left;30 seconds;2 reps   seated   Quad Stretch  Right;Left;2 reps;30 seconds   prone with  strap    Piriformis Stretch  Right;2 reps;30 seconds   supine figure 4      Lumbar Exercises: Aerobic   UBE (Upper Arm Bike)  L2 x 2 min alt forward/back       Lumbar Exercises: Standing   Wall Slides  10 reps;3 seconds   core engaged      Lumbar Exercises: Supine   Clam  10 reps;2 seconds   holding one LE still moving opposite - alternating LE's    Dead Bug  10 reps   core engaged - alternating LE's    Bridge  10 reps;5 seconds   with ball squeeze   Other Supine Lumbar Exercises  3 part core 10 sec x 5 reps       Lumbar Exercises: Prone   Opposite Arm/Leg Raise  Right arm/Left leg;Left arm/Right leg;10 reps    Other Prone Lumbar Exercises  glut set w/ core engaged 10 sec x 10       Moist Heat Therapy   Number Minutes Moist Heat  15 Minutes    Moist Heat Location  Lumbar Spine;Hip   Rt anterior hip      Electrical Stimulation   Electrical Stimulation Location  Rt posterior hip/TFL     Electrical Stimulation Action  IFC    Electrical Stimulation Parameters  to tolerance    Electrical Stimulation Goals  Pain;Tone      Manual Therapy   Manual therapy comments  pt prone     Joint Mobilization  PA glides Rt greater trochanter    Soft tissue mobilization  deep tissue work through the Rt posterior lateral hip/piriformis/gluts into proximal TFL - tender posterior greater trochanter     Passive ROM  IR/ER Rt LE pt prone with knee in flexion              PT Education - 08/26/18 1649    Education Details  HEP     Person(s) Educated  Patient    Methods  Explanation;Demonstration;Tactile cues;Verbal cues;Handout    Comprehension  Verbalized understanding;Returned demonstration;Verbal cues required;Tactile cues required          PT Long Term Goals - 08/24/18 1216      PT LONG TERM GOAL #1   Title  Decrease pain in Rt hip 50-75% allowing patient to perfrom normal functional activities with greater ease 09/27/18    Time  6    Status  On-going      PT LONG TERM GOAL  #2   Title  Increase functional strength Rt LE to 5-/5 to 5/5 throughout 09/27/18    Time  6    Period  Weeks    Status  On-going      PT LONG TERM GOAL #3   Title  Patient reports tolerating 10-15 min of walking on level surfaces 2-3 times/week 09/27/18    Time  6    Period  Weeks    Status  On-going      PT LONG TERM GOAL #4   Title  Independent in HEP 09/27/18    Time  6    Period  Weeks    Status  On-going      PT LONG TERM GOAL #5   Title  Iprove FOTO to </= 35% limitation 09/27/18    Time  6    Period  Weeks    Status  On-going            Plan - 08/26/18 1626    Clinical Impression Statement  Patient returns with continued pain down into the lateral Rt thigh - which she has experienced before. Patient reports that she is working on the exercises 'some" at home. She continues to have palpable Rt posterior/lateral hip through the TFL and gluts/piriformis. - especially tight through the posterior hip musculature at greater trochanter.     Rehab Potential  Good    PT Frequency  2x / week    PT Duration  6 weeks    PT Treatment/Interventions  Patient/family education;ADLs/Self Care Home Management;Cryotherapy;Electrical Stimulation;Iontophoresis 4mg /ml Dexamethasone;Moist Heat;Ultrasound;Dry needling;Manual techniques;Neuromuscular re-education;Therapeutic activities;Therapeutic exercise    PT Next Visit Plan  assess pelvis alignment; manual therapy to adductors/ Rt piriformis and psoas.      Consulted and Agree with Plan of Care  Patient       Patient will benefit from skilled therapeutic intervention in order to improve the following deficits and impairments:  Postural dysfunction, Improper body mechanics, Pain, Increased fascial restricitons, Increased muscle spasms, Hypomobility, Decreased strength, Decreased mobility, Decreased range of motion, Decreased activity  tolerance  Visit Diagnosis: Pain in right hip  Other symptoms and signs involving the musculoskeletal  system  Muscle weakness (generalized)     Problem List Patient Active Problem List   Diagnosis Date Noted  . Elevated fasting glucose 04/08/2018  . Bipolar 1 disorder, depressed, severe ([place]) 01/25/2018  . Depression   . Anxiety 01/22/2018  . Borderline personality disorder ([place]) 01/22/2018  . Rheumatoid arthritis 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 ([place]) 12/09/2017  . TMJ (temporomandibular joint syndrome) 09/26/2017  . New daily persistent headache 09/26/2017  . MTHFR gene mutation ([place]) 09/20/2017  . Bipolar I disorder ([place]) 09/16/2017  . Fibromyalgia 09/16/2017  . Chronic fatigue syndrome 09/16/2017  . DYSPNEA 10/15/2011  . FEVER, RECURRENT 04/02/2009  . DERMATITIS, ALLERGIC 06/05/2008  . FATIGUE 05/05/2008  . HYPOTHYROIDISM 02/10/2008  . 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 FIBROMYALGIA 02/10/2008  . IRON, SERUM, ELEVATED 02/10/2008    Persephone Schriever Nilda Simmer PT, MPH  08/26/2018, 5:25 PM  [facility] [place], [place], [address] Phone: [phone]   Fax:  [phone]  Name: MEADOW ABRAMO MRN: [id number] Date of Birth: 1976/08/19

## 2018-08-26 NOTE — Patient Instructions (Addendum)
Back Wall Slide    With feet __10-12 inches __ inches from wall, lean as much of back against the wall as possible. Gently squat down __several _ inches, keeping back against wall. Hold __10__ seconds while counting out loud. Repeat _10___ times. Do __1__ sessions per day.   Strengthening: Hip Abductor - Resisted    With band looped around both legs above knees, hold one leg still and slowly move opposite leg out to the side Pause. Return to upright positions. Repeat with opposite leg.  Repeat _10___ times per set. Do __1-2__ sets per session. Do __1__ sessions per day.   Kinesiology tape What is kinesiology tape?  There are many brands of kinesiology tape.  KTape, [employer], [employer], Dynamic tape, to name a few. It is an elasticized tape designed to support the body's natural healing process. This tape provides stability and support to muscles and joints without restricting motion. It can also help decrease swelling in the area of application. How does it work? The tape microscopically lifts and decompresses the skin to allow for drainage of lymph (swelling) to flow away from area, reducing inflammation.  The tape has the ability to help re-educate the neuromuscular system by targeting specific receptors in the skin.  The presence of the tape increases the body's awareness of posture and body mechanics.  Do not use with: . Open wounds . Skin lesions . Adhesive allergies Safe removal of the tape: In some rare cases, mild/moderate skin irritation can occur.  This can include redness, itchiness, or hives. If this occurs, immediately remove tape and consult your primary care physician if symptoms are severe or do not resolve within 2 days.  To remove tape safely, hold nearby skin with one hand and gentle roll tape down with other hand.  You can apply oil or conditioner to tape while in shower prior to removal to loosen adhesive.  DO NOT swiftly rip tape off like a band-aid, as this could  cause skin tears and additional skin irritation.

## 2018-08-26 NOTE — Telephone Encounter (Signed)
Frances [name] called and said she was seen in Urgent Care on 08/23/2018 due to her scratching her eye. She said that the PA was going to give her a recommendation for an eye specialist, but she never did. Patient wants to know if you could possibly recommend an eye specialist? Please advise.

## 2018-08-27 ENCOUNTER — Encounter: Payer: Self-pay | Admitting: Rehabilitative and Restorative Service Providers"

## 2018-08-27 NOTE — Telephone Encounter (Signed)
Ok [facility] is associated [facility]. Most corneal abrasion heal very well on there own. Are you still having problems?

## 2018-08-30 ENCOUNTER — Ambulatory Visit: Admit: 2018-08-30 | Discharge: 2018-08-31 | Attending: Family Medicine | Primary: Family Medicine

## 2018-08-30 ENCOUNTER — Ambulatory Visit (INDEPENDENT_AMBULATORY_CARE_PROVIDER_SITE_OTHER): Payer: Self-pay | Admitting: Physician Assistant

## 2018-08-30 ENCOUNTER — Encounter: Payer: Self-pay | Admitting: Physician Assistant

## 2018-08-30 VITALS — BP 106/74 | HR 77 | Temp 98.1°F | Wt 176.0 lb

## 2018-08-30 DIAGNOSIS — S0501XD Injury of conjunctiva and corneal abrasion without foreign body, right eye, subsequent encounter: Secondary | ICD-10-CM

## 2018-08-30 DIAGNOSIS — S0501XA Injury of conjunctiva and corneal abrasion without foreign body, right eye, initial encounter: Secondary | ICD-10-CM | POA: Insufficient documentation

## 2018-08-30 DIAGNOSIS — H547 Unspecified visual loss: Secondary | ICD-10-CM | POA: Insufficient documentation

## 2018-08-30 DIAGNOSIS — G5701 Lesion of sciatic nerve, right lower limb: Principal | ICD-10-CM

## 2018-08-30 MED ORDER — ERYTHROMYCIN 5 MG/GM OP OINT: 1.0000 "application " | TOPICAL_OINTMENT | Freq: Every day | OPHTHALMIC | 0 refills | Status: DC

## 2018-08-30 MED ORDER — CYCLOSPORINE 0.05 % OP EMUL: 1.0000 [drp] | Freq: Two times a day (BID) | OPHTHALMIC | 0 refills | Status: DC

## 2018-08-30 NOTE — Patient Instructions (Addendum)
Continue to wear your eyeglasses Use an eye lubricant during the day (preservative free options are available over-the-counter) Use Erythromycin ointment at bedtime

## 2018-08-30 NOTE — Progress Notes (Signed)
HPI:                                                                Frances [name] is a 42 y.o. female who presents to [facility] today for blurred vision  Diagnosed with corneal abrasion of right eye on 08/23/18 in urgent care. Continues to endorse noticeable blurred vision bilaterally, worse in her right eye. Denies eye redness, drainage, pain, foreign body sensation, photophobia, floaters/visual disturbance. Wears contact lenses, but has been wearing glasses for the last week. Does not have an eye doctor; sees Optometrist at [facility].   Past Medical History:  Diagnosis Date  . Anxiety   . Arthritis   . Back pain, chronic   . Bipolar 1 disorder ([place])   . Chronic fatigue syndrome   . DDD (degenerative disc disease), lumbar   . Depression   . Fibromyalgia   . IUD 2012   Mirena  . MTHFR gene mutation [facility])    Past Surgical History:  Procedure Laterality Date  . COLONOSCOPY WITH PROPOFOL N/A 02/25/2017   Procedure: COLONOSCOPY WITH PROPOFOL;  Surgeon: Arta Silence, MD;  [facility];  Service: Endoscopy;  Laterality: N/A;  . left knee lateral release  1993  . scar tisue removal  03/2005   left ankle  . TONSILLECTOMY  age 62's   Social History   Tobacco Use  . Smoking status: Never Smoker  . Smokeless tobacco: Never Used  Substance Use Topics  . Alcohol use: No    Alcohol/week: 0.0 standard drinks   family history includes Alcohol abuse in her maternal uncle; Cancer in her maternal grandmother; Congestive Heart Failure in her mother; Depression in her maternal uncle; Diabetes in her maternal grandfather and mother; Hypertension in her mother; Lupus in her mother; Mental illness in her brother; Stroke in her mother.    ROS: negative except as noted in the HPI  Medications: Current Outpatient Medications  Medication Sig Dispense Refill  . AMBULATORY NON FORMULARY MEDICATION Take 1 tablet by mouth daily.  Medication Name: Joint Supplement    . ARIPiprazole (ABILIFY) 10 MG tablet Take 1 tablet (10 mg total) by mouth daily. 30 tablet 0  . benztropine (COGENTIN) 0.5 MG tablet Take 0.5 mg by mouth 2 (two) times daily.    . Cetirizine HCl (ZYRTEC PO) Take by mouth.    . Chelated Magnesium 100 MG TABS Take 2 tablets by mouth daily.    . Cyanocobalamin (B-12 PO) Take 800 mcg by mouth daily.    . folic acid (FOLVITE) 409 MCG tablet Take 1 tablet (800 mcg total) by mouth daily. (Patient not taking: Reported on 08/16/2018) 30 tablet 0  . gentamicin (GARAMYCIN) 0.3 % ophthalmic solution Place 1 drop into the right eye every 4 (four) hours for 7 days. 5 mL 0  . hydrOXYzine (ATARAX/VISTARIL) 25 MG tablet Take 1 tablet (25 mg total) by mouth 3 (three) times daily as needed for anxiety. 30 tablet 0  . leflunomide (ARAVA) 10 MG tablet Take by mouth.    . meloxicam (MOBIC) 15 MG tablet Take 1 tablet (15 mg total) by mouth daily. 90 tablet 2  . methotrexate (RHEUMATREX) 2.5 MG tablet Take 10 mg by mouth once a week. Caution:Chemotherapy.  Protect from light.    . Mometasone Furoate (NASONEX NA) Place into the nose.    . montelukast (SINGULAIR) 10 MG tablet Take 1 tablet (10 mg total) by mouth at bedtime. 30 tablet 5  . NASONEX 50 MCG/ACT nasal spray USE 2 SPRAYS IN EACH NOSTRIL EVERY DAY 17 g 0  . Omega-3 Fatty Acids (FISH OIL PO) Take by mouth.    . predniSONE (DELTASONE) 10 MG tablet Take 10 mg by mouth daily with breakfast.    . PROVENTIL HFA 108 (90 Base) MCG/ACT inhaler INHALE 2 PUFFS BY MOUTH FOUR TIMES DAILY AS NEEDED FOR COUGHING, WHEEZING, OR SHORTNESS OF BREATH  0  . sertraline (ZOLOFT) 25 MG tablet Take 25 mg by mouth daily.    . traZODone (DESYREL) 50 MG tablet Take by mouth.    . vitamin C (ASCORBIC ACID) 500 MG tablet Take 500 mg by mouth daily.     No current facility-administered medications for this visit.    Allergies  Allergen Reactions  . Metaxalone Hives       Objective:  BP 106/74    Pulse 77   Temp 98.1 F (36.7 C) (Oral)   Wt 176 lb (79.8 kg)   BMI 27.57 kg/m  Gen:  alert, not ill-appearing, no distress, appropriate for age HEENT: head normocephalic without obvious abnormality, eyelids normal bilaterally, conjunctiva and cornea clear, visual acuity, OD 20/70, OS 20/40, OU 20/30 with correction, PERRL, EOMs intact   No results found for this or any previous visit (from the past 72 hour(s)). No results found.    Assessment and Plan: 42 y.o. female with   .Tasha was seen today for blurred vision.  Diagnoses and all orders for this visit:  Decreased visual acuity -     Ambulatory referral to Ophthalmology  Abrasion of right cornea, subsequent encounter -     Ambulatory referral to Ophthalmology -     erythromycin ophthalmic ointment; Place 1 application into the right eye at bedtime. -     cycloSPORINE (RESTASIS) 0.05 % ophthalmic emulsion; Place 1 drop into the right eye 2 (two) times daily.   Decreased visual acuity of the right eye, 20/70 with correction, no scleral injection or abnormality on limited eye exam, no red flag symptoms Urgent referral placed to Ophthalmology  Erythromycin QHS Lubricant eye drops bid Continue to avoid contact lenses and wear glasses   Patient education and anticipatory guidance given Patient agrees with treatment plan Follow-up as needed if symptoms worsen or fail to improve  Darlyne Russian PA-C

## 2018-08-31 ENCOUNTER — Ambulatory Visit (INDEPENDENT_AMBULATORY_CARE_PROVIDER_SITE_OTHER): Payer: Self-pay | Admitting: Physical Therapy

## 2018-08-31 DIAGNOSIS — R29898 Other symptoms and signs involving the musculoskeletal system: Secondary | ICD-10-CM

## 2018-08-31 DIAGNOSIS — M6281 Muscle weakness (generalized): Secondary | ICD-10-CM

## 2018-08-31 DIAGNOSIS — M25551 Pain in right hip: Secondary | ICD-10-CM

## 2018-08-31 NOTE — Therapy (Signed)
[place], [place], [address] Phone: [phone]   Fax:  [phone]  Physical Therapy Treatment  Patient Details  Name: Frances [name] MRN: [id number] Date of Birth: Sep 12, 1976 Referring Provider: Dr. Henry Russel   Encounter Date: 08/31/2018  PT End of Session - 08/31/18 1104    Visit Number  5    Number of Visits  12    Date for PT Re-Evaluation  09/27/18    PT Start Time  1020    PT Stop Time  1118    PT Time Calculation (min)  58 min    Activity Tolerance  Patient tolerated treatment well;No increased pain    Behavior During Therapy  WFL for tasks assessed/performed       Past Medical History:  Diagnosis Date  . Anxiety   . Arthritis   . Back pain, chronic   . Bipolar 1 disorder ([place])   . Chronic fatigue syndrome   . DDD (degenerative disc disease), lumbar   . Depression   . Fibromyalgia   . IUD 2012   Mirena  . MTHFR gene mutation [facility])     Past Surgical History:  Procedure Laterality Date  . COLONOSCOPY WITH PROPOFOL N/A 02/25/2017   Procedure: COLONOSCOPY WITH PROPOFOL;  Surgeon: Arta Silence, MD;  [facility];  Service: Endoscopy;  Laterality: N/A;  . left knee lateral release  1993  . scar tisue removal  03/2005   left ankle  . TONSILLECTOMY  age 42's    There were no vitals filed for this visit.  Subjective Assessment - 08/31/18 1021    Subjective  Pt reports she had an injection in her (posterior) hip yesterday.  "It has helped, for now".    her hip, "did pretty good this weekend, while I was walking with the horses".  She reports her legs were really sore from the new exercises (specifically wall squats) performed in last session.     Currently in Pain?  Yes    Pain Score  1     Pain Location  Hip    Pain Orientation  Right    Pain Descriptors / Indicators  Dull    Aggravating Factors   prolonged walking.  flexing hip    Pain Relieving Factors   medication, injection, rest.          [facility] PT Assessment - 08/31/18 0001      Assessment   Medical Diagnosis  Rt hip pain     Referring Provider  Dr. Henry Russel    Onset Date/Surgical Date  03/22/18   worse in the past 3-4 months present for > 1 year    Hand Dominance  Right       [facility] Adult PT Treatment/Exercise - 08/31/18 0001      Self-Care   Self-Care  Other Self-Care Comments    Other Self-Care Comments   Pt re-educated on self massage with hands and with ball to anterior hip musculature,  and adductors. Pt returned demo with VC.       Lumbar Exercises: Aerobic   Tread Mill  1.5-1.6 mph, 5 min    PTA present to discuss progress     Moist Heat Therapy   Number Minutes Moist Heat  20 Minutes    Moist Heat Location  Lumbar Spine;Hip   Rt anterior hip      Electrical Stimulation   Electrical Stimulation Location  Rt posterior hip/TFL     Electrical  Stimulation Action  IFC    Electrical Stimulation Parameters  to tolerance    Electrical Stimulation Goals  Tone;Pain      Manual Therapy   Manual Therapy  Myofascial release;Joint mobilization;Soft tissue mobilization    Joint Mobilization  PA glides Rt greater trochanter    Soft tissue mobilization  deep tissue work through the Rt posterior lateral hip/piriformis/gluts into proximal TFL; cross fiber friction to Rt adductors (very point tender mid belly of group)    Myofascial Release  MFR to Rt iliopsoas and Rt adductors    Passive ROM  IR/ER Rt LE pt prone with knee in flexion                   PT Long Term Goals - 08/31/18 1023      PT LONG TERM GOAL #1   Title  Decrease pain in Rt hip 50-75% allowing patient to perfrom normal functional activities with greater ease 09/27/18    Time  6    Period  Weeks    Status  On-going   50% reduction of pain.      PT LONG TERM GOAL #2   Title  Increase functional strength Rt LE to 5-/5 to 5/5 throughout 09/27/18    Time  6    Period  Weeks    Status  On-going       PT LONG TERM GOAL #3   Title  Patient reports tolerating 10-15 min of walking on level surfaces 2-3 times/week 09/27/18    Time  6    Period  Weeks      PT LONG TERM GOAL #4   Title  Independent in HEP 09/27/18    Time  6    Period  Weeks    Status  On-going      PT LONG TERM GOAL #5   Title  Iprove FOTO to </= 35% limitation 09/27/18    Time  6    Period  Weeks    Status  On-going            Plan - 08/31/18 1025    Clinical Impression Statement  Pt reporting 50% reduction of hip pain since initiating therapy.  She is able to walk 30-45 min with pain up to 3/10. She continue to have palpable tightness/ tenderness in Rt adductor group, Rt iliopsoas and TFL; somewhat improved with manual therapy.  Pt may benefit from DN in future session.  Pt making gradual progress towards goals.     Rehab Potential  Good    PT Frequency  2x / week    PT Duration  6 weeks    PT Treatment/Interventions  Patient/family education;ADLs/Self Care Home Management;Cryotherapy;Electrical Stimulation;Iontophoresis 4mg /ml Dexamethasone;Moist Heat;Ultrasound;Dry needling;Manual techniques;Neuromuscular re-education;Therapeutic activities;Therapeutic exercise    PT Next Visit Plan  manual therapy to adductors/ Rt piriformis and psoas.  progress stretching/ strengthening program as tolerated.     Consulted and Agree with Plan of Care  Patient       Patient will benefit from skilled therapeutic intervention in order to improve the following deficits and impairments:  Postural dysfunction, Improper body mechanics, Pain, Increased fascial restricitons, Increased muscle spasms, Hypomobility, Decreased strength, Decreased mobility, Decreased range of motion, Decreased activity tolerance  Visit Diagnosis: Pain in right hip  Other symptoms and signs involving the musculoskeletal system  Muscle weakness (generalized)     Problem List Patient Active Problem List   Diagnosis Date Noted  . Decreased  visual acuity 08/30/2018  .  Right corneal abrasion 08/30/2018  . Elevated fasting glucose 04/08/2018  . Bipolar 1 disorder, depressed, severe ([place]) 01/25/2018  . Depression   . Anxiety 01/22/2018  . Borderline personality disorder ([place]) 01/22/2018  . Rheumatoid arthritis 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 ([place]) 12/09/2017  . TMJ (temporomandibular joint syndrome) 09/26/2017  . New daily persistent headache 09/26/2017  . MTHFR gene mutation ([place]) 09/20/2017  . Bipolar I disorder ([place]) 09/16/2017  . Fibromyalgia 09/16/2017  . Chronic fatigue syndrome 09/16/2017  . DYSPNEA 10/15/2011  . FEVER, RECURRENT 04/02/2009  . DERMATITIS, ALLERGIC 06/05/2008  . FATIGUE 05/05/2008  . HYPOTHYROIDISM 02/10/2008  . 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 FIBROMYALGIA 02/10/2008  . IRON, SERUM, ELEVATED 02/10/2008   Kerin Perna, PTA 08/31/18 12:18 PM  [place], [place], [address] Phone: [phone]   Fax:  [phone]  Name: ALOHILANI LEVENHAGEN MRN: [id number] Date of Birth: 07-Nov-1976

## 2018-09-02 ENCOUNTER — Ambulatory Visit (INDEPENDENT_AMBULATORY_CARE_PROVIDER_SITE_OTHER): Payer: Self-pay | Admitting: Physical Therapy

## 2018-09-02 ENCOUNTER — Encounter: Payer: Self-pay | Admitting: Physical Therapy

## 2018-09-02 DIAGNOSIS — R29898 Other symptoms and signs involving the musculoskeletal system: Secondary | ICD-10-CM

## 2018-09-02 DIAGNOSIS — M25551 Pain in right hip: Secondary | ICD-10-CM

## 2018-09-02 DIAGNOSIS — M6281 Muscle weakness (generalized): Secondary | ICD-10-CM

## 2018-09-02 NOTE — Therapy (Signed)
[place], [place], [address] Phone: [phone]   Fax:  [phone]  Physical Therapy Treatment  Patient Details  Name: Frances [name] MRN: [id number] Date of Birth: 1976/04/15 Referring Provider: Dr. Henry Russel   Encounter Date: 09/02/2018  PT End of Session - 09/02/18 1629    Visit Number  6    Number of Visits  12    Date for PT Re-Evaluation  09/27/18    PT Start Time  1620    PT Stop Time  1718    PT Time Calculation (min)  58 min       Past Medical History:  Diagnosis Date  . Anxiety   . Arthritis   . Back pain, chronic   . Bipolar 1 disorder ([place])   . Chronic fatigue syndrome   . DDD (degenerative disc disease), lumbar   . Depression   . Fibromyalgia   . IUD 2012   Mirena  . MTHFR gene mutation [facility])     Past Surgical History:  Procedure Laterality Date  . COLONOSCOPY WITH PROPOFOL N/A 02/25/2017   Procedure: COLONOSCOPY WITH PROPOFOL;  Surgeon: Arta Silence, MD;  Location: [facility];  Service: Endoscopy;  Laterality: N/A;  . left knee lateral release  1993  . scar tisue removal  03/2005   left ankle  . TONSILLECTOMY  age 23's    There were no vitals filed for this visit.  Subjective Assessment - 09/02/18 1623    Subjective  Pt reports no new changes since last visit.     Patient Stated Goals  try to get the hip feeling better     Currently in Pain?  Yes    Pain Score  2     Pain Location  Hip    Pain Orientation  Right;Anterior;Lateral    Pain Descriptors / Indicators  Simonne Martinet PT Assessment - 09/02/18 0001      Assessment   Medical Diagnosis  Rt hip pain     Referring Provider  Dr. Henry Russel    Onset Date/Surgical Date  03/22/18   worse in the past 3-4 months present for > 1 year    Hand Dominance  Right        [facility] Adult PT Treatment/Exercise - 09/02/18 0001      Exercises   Exercises  Knee/Hip      Lumbar Exercises:  Stretches   Hip Flexor Stretch  Right;Left;2 reps;30 seconds   seated   Quad Stretch  Right;Left;2 reps;30 seconds   prone with strap    Piriformis Stretch  Right;2 reps;30 seconds   seated x 1, supine x 1     Lumbar Exercises: Aerobic   Tread Mill  1.6-1.9 mph, 5 min    PTA present to discuss progress     Lumbar Exercises: Standing   Wall Slides  5 reps;5 seconds      Lumbar Exercises: Prone   Opposite Arm/Leg Raise  Right arm/Left leg;Left arm/Right leg;10 reps      Knee/Hip Exercises: Standing   Functional Squat  10 reps   while straddling nautilus seat (simulate horse riding)   Other Standing Knee Exercises  split squat with limited depth x 5 reps each side, BUE support (on static treadmill)      Moist Heat Therapy   Number Minutes Moist Heat  15 Minutes    Moist Heat Location  Lumbar Spine;Hip   Rt  anterior hip      Electrical Stimulation   Electrical Stimulation Location  Rt anterior hip/adductor    Electrical Stimulation Action  IFC    Electrical Stimulation Parameters  to tolerance      Manual Therapy   Joint Mobilization  --    Myofascial Release  MFR to Rt iliopsoas and Rt adductors                  PT Long Term Goals - 08/31/18 1023      PT LONG TERM GOAL #1   Title  Decrease pain in Rt hip 50-75% allowing patient to perfrom normal functional activities with greater ease 09/27/18    Time  6    Period  Weeks    Status  On-going   50% reduction of pain.      PT LONG TERM GOAL #2   Title  Increase functional strength Rt LE to 5-/5 to 5/5 throughout 09/27/18    Time  6    Period  Weeks    Status  On-going      PT LONG TERM GOAL #3   Title  Patient reports tolerating 10-15 min of walking on level surfaces 2-3 times/week 09/27/18    Time  6    Period  Weeks      PT LONG TERM GOAL #4   Title  Independent in HEP 09/27/18    Time  6    Period  Weeks    Status  On-going      PT LONG TERM GOAL #5   Title  Iprove FOTO to </= 35% limitation  09/27/18    Time  6    Period  Weeks    Status  On-going            Plan - 09/02/18 1820    Clinical Impression Statement  Pt making gradual progress towards goals, reporting improved functional mobility and less hip tightness.  She had palpable banding/tightness in her Rt adductors (midbelly) and tenderness/tightness in Rt iliopsoas.  Pt tolerated exercises better today than in past sessions, reporting less fatigue and pain afterwards.      Rehab Potential  Good    PT Frequency  2x / week    PT Duration  6 weeks    PT Next Visit Plan  manual therapy/DN to adductors/ Rt piriformis and psoas.  progress stretching/ strengthening program as tolerated.     Consulted and Agree with Plan of Care  Patient       Patient will benefit from skilled therapeutic intervention in order to improve the following deficits and impairments:  Postural dysfunction, Improper body mechanics, Pain, Increased fascial restricitons, Increased muscle spasms, Hypomobility, Decreased strength, Decreased mobility, Decreased range of motion, Decreased activity tolerance  Visit Diagnosis: Pain in right hip  Other symptoms and signs involving the musculoskeletal system  Muscle weakness (generalized)     Problem List Patient Active Problem List   Diagnosis Date Noted  . Decreased visual acuity 08/30/2018  . Right corneal abrasion 08/30/2018  . Elevated fasting glucose 04/08/2018  . Bipolar 1 disorder, depressed, severe ([place]) 01/25/2018  . Depression   . Anxiety 01/22/2018  . Borderline personality disorder ([place]) 01/22/2018  . Rheumatoid arthritis 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 ([place]) 12/09/2017  . TMJ (temporomandibular joint syndrome) 09/26/2017  . New daily persistent headache 09/26/2017  . MTHFR gene mutation ([place]) 09/20/2017  . Bipolar I disorder ([place]) 09/16/2017  . Fibromyalgia 09/16/2017  . Chronic fatigue  syndrome 09/16/2017  . DYSPNEA 10/15/2011  . FEVER, RECURRENT 04/02/2009   . DERMATITIS, ALLERGIC 06/05/2008  . FATIGUE 05/05/2008  . HYPOTHYROIDISM 02/10/2008  . 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 FIBROMYALGIA 02/10/2008  . IRON, SERUM, ELEVATED 02/10/2008   Kerin Perna, PTA 09/02/18 6:23 PM  [place] [facility] [place], [place], [address] Phone: [phone]   Fax:  [phone]  Name: Frances [name] MRN: [id number] Date of Birth: 11-Sep-1976

## 2018-09-06 ENCOUNTER — Ambulatory Visit (INDEPENDENT_AMBULATORY_CARE_PROVIDER_SITE_OTHER): Payer: Self-pay | Admitting: Rehabilitative and Restorative Service Providers"

## 2018-09-06 ENCOUNTER — Encounter: Payer: Self-pay | Admitting: Rehabilitative and Restorative Service Providers"

## 2018-09-06 DIAGNOSIS — M25551 Pain in right hip: Secondary | ICD-10-CM

## 2018-09-06 DIAGNOSIS — R29898 Other symptoms and signs involving the musculoskeletal system: Secondary | ICD-10-CM

## 2018-09-06 DIAGNOSIS — M6281 Muscle weakness (generalized): Secondary | ICD-10-CM

## 2018-09-06 NOTE — Therapy (Signed)
[place], [place], [address] Phone: [phone]   Fax:  [phone]  Physical Therapy Treatment  Patient Details  Name: Frances [name] MRN: [id number] Date of Birth: Jul 24, 1976 Referring Provider: Dr. Henry Russel   Encounter Date: 09/06/2018  PT End of Session - 09/06/18 1629    Visit Number  7    Number of Visits  12    Date for PT Re-Evaluation  09/27/18    PT Start Time  0258    PT Stop Time  1705    PT Time Calculation (min)  56 min    Activity Tolerance  Patient tolerated treatment well       Past Medical History:  Diagnosis Date  . Anxiety   . Arthritis   . Back pain, chronic   . Bipolar 1 disorder ([place])   . Chronic fatigue syndrome   . DDD (degenerative disc disease), lumbar   . Depression   . Fibromyalgia   . IUD 2012   Mirena  . MTHFR gene mutation [facility])     Past Surgical History:  Procedure Laterality Date  . COLONOSCOPY WITH PROPOFOL N/A 02/25/2017   Procedure: COLONOSCOPY WITH PROPOFOL;  Surgeon: Arta Silence, MD;  [facility];  Service: Endoscopy;  Laterality: N/A;  . left knee lateral release  1993  . scar tisue removal  03/2005   left ankle  . TONSILLECTOMY  age 37's    There were no vitals filed for this visit.  Subjective Assessment - 09/06/18 1630    Subjective  May be feeling some better. Sometimes has a "catch" in the Rt hip which MD says is not related to labral tear.     Currently in Pain?  Yes    Pain Score  3     Pain Location  Hip    Pain Orientation  Right;Posterior;Lateral    Pain Descriptors / Indicators  Dull;Aching    Pain Type  Chronic pain    Pain Radiating Towards  varies in area of pain from posterior hip to lateral thigh(no longer constant); sometimes in the anterior hip     Pain Onset  More than a month ago    Pain Frequency  Intermittent                       [facility] Adult PT Treatment/Exercise - 09/06/18  0001      Lumbar Exercises: Stretches   Hip Flexor Stretch  Right;Left;2 reps;30 seconds   seated   Quad Stretch  Right;Left;2 reps;30 seconds   prone with strap    Piriformis Stretch  Right;2 reps;30 seconds   seated x 1, supine x 1     Moist Heat Therapy   Number Minutes Moist Heat  15 Minutes    Moist Heat Location  Lumbar Spine;Hip   Rt anterior hip      Electrical Stimulation   Electrical Stimulation Location  posterior hip     Electrical Stimulation Action  IFC    Electrical Stimulation Parameters  to tolerancew    Electrical Stimulation Goals  Tone;Pain      Manual Therapy   Manual therapy comments  pt prone     Joint Mobilization  PA glides Rt greater trochanter    Soft tissue mobilization  deep tissue work through the Rt posterior lateral hip/piriformis/gluts into proximal TFL    Passive ROM  IR/ER Rt LE pt prone with knee in flexion  Trigger Point Dry Needling - 09/06/18 1652    Consent Given?  Yes    Education Handout Provided  Yes    Muscles Treated Lower Body  --   Rt with estim   Gluteus Maximus Response  Palpable increased muscle length    Gluteus Minimus Response  Palpable increased muscle length;Twitch response elicited                PT Long Term Goals - 08/31/18 1023      PT LONG TERM GOAL #1   Title  Decrease pain in Rt hip 50-75% allowing patient to perfrom normal functional activities with greater ease 09/27/18    Time  6    Period  Weeks    Status  On-going   50% reduction of pain.      PT LONG TERM GOAL #2   Title  Increase functional strength Rt LE to 5-/5 to 5/5 throughout 09/27/18    Time  6    Period  Weeks    Status  On-going      PT LONG TERM GOAL #3   Title  Patient reports tolerating 10-15 min of walking on level surfaces 2-3 times/week 09/27/18    Time  6    Period  Weeks      PT LONG TERM GOAL #4   Title  Independent in HEP 09/27/18    Time  6    Period  Weeks    Status  On-going      PT LONG TERM GOAL #5    Title  Iprove FOTO to </= 35% limitation 09/27/18    Time  6    Period  Weeks    Status  On-going            Plan - 09/06/18 1653    Clinical Impression Statement  Patient reports that she no longer has that constant aching. She tolerated DN well with good improvement in tissue extensibility and decrease in palpable tightness through the posterior Rt hip. Patient was encouraged to continue with consistent HEP and avoid sitting with LE's crossed. [name] continues to progress toward stated goals of therapy.     Rehab Potential  Good    PT Frequency  2x / week    PT Duration  6 weeks    PT Next Visit Plan  manual therapyassess response to/DN posterior Rt hip(glut min/max) possible trial to hip adductors/continue with deep tissue work Rt piriformis and psoas.  progress stretching/ strengthening program as tolerated.     Consulted and Agree with Plan of Care  Patient       Patient will benefit from skilled therapeutic intervention in order to improve the following deficits and impairments:  Postural dysfunction, Improper body mechanics, Pain, Increased fascial restricitons, Increased muscle spasms, Hypomobility, Decreased strength, Decreased mobility, Decreased range of motion, Decreased activity tolerance  Visit Diagnosis: Pain in right hip  Other symptoms and signs involving the musculoskeletal system  Muscle weakness (generalized)     Problem List Patient Active Problem List   Diagnosis Date Noted  . Decreased visual acuity 08/30/2018  . Right corneal abrasion 08/30/2018  . Elevated fasting glucose 04/08/2018  . Bipolar 1 disorder, depressed, severe ([place]) 01/25/2018  . Depression   . Anxiety 01/22/2018  . Borderline personality disorder ([place]) 01/22/2018  . Rheumatoid arthritis 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 ([place]) 12/09/2017  . TMJ (temporomandibular joint syndrome) 09/26/2017  . New daily persistent headache 09/26/2017  . MTHFR gene mutation ([place])  09/20/2017  . Bipolar I disorder ([place]) 09/16/2017  . Fibromyalgia 09/16/2017  . Chronic fatigue syndrome 09/16/2017  . DYSPNEA 10/15/2011  . FEVER, RECURRENT 04/02/2009  . DERMATITIS, ALLERGIC 06/05/2008  . FATIGUE 05/05/2008  . HYPOTHYROIDISM 02/10/2008  . 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 FIBROMYALGIA 02/10/2008  . IRON, SERUM, ELEVATED 02/10/2008    Matvey Llanas Nilda Simmer PT, MPH  09/06/2018, 4:57 PM  [facility] [place], [place], [address] Phone: [phone]   Fax:  [phone]  Name: Frances [name] MRN: [id number] Date of Birth: 24-Oct-1976

## 2018-09-06 NOTE — Patient Instructions (Signed)

## 2018-09-07 ENCOUNTER — Ambulatory Visit: Admit: 2018-09-07 | Discharge: 2018-09-08 | Attending: Ophthalmology | Primary: Ophthalmology

## 2018-09-07 DIAGNOSIS — Z79899 Other long term (current) drug therapy: Secondary | ICD-10-CM

## 2018-09-07 DIAGNOSIS — H538 Other visual disturbances: Principal | ICD-10-CM

## 2018-09-10 ENCOUNTER — Ambulatory Visit (INDEPENDENT_AMBULATORY_CARE_PROVIDER_SITE_OTHER): Payer: Self-pay | Admitting: Physical Therapy

## 2018-09-10 DIAGNOSIS — R29898 Other symptoms and signs involving the musculoskeletal system: Secondary | ICD-10-CM

## 2018-09-10 DIAGNOSIS — M25551 Pain in right hip: Secondary | ICD-10-CM

## 2018-09-10 DIAGNOSIS — M6281 Muscle weakness (generalized): Secondary | ICD-10-CM

## 2018-09-10 NOTE — Therapy (Signed)
[facility] [address] [place], [place], [address] Phone: [phone]   Fax:  [phone]  Physical Therapy Treatment  Patient Details  Name: Frances [name] MRN: [id number] Date of Birth: 02/10/1976 Referring Provider: Dr. Ganesh Kamath   Encounter Date: 09/10/2018  PT End of Session - 09/10/18 1535    Visit Number  8    Number of Visits  12    Date for PT Re-Evaluation  09/27/18    PT Start Time  1532    PT Stop Time  1625    PT Time Calculation (min)  53 min    Activity Tolerance  Patient tolerated treatment well;No increased pain    Behavior During Therapy  WFL for tasks assessed/performed       Past Medical History:  Diagnosis Date  . Anxiety   . Arthritis   . Back pain, chronic   . Bipolar 1 disorder ([facility])   . Chronic fatigue syndrome   . DDD (degenerative disc disease), lumbar   . Depression   . Fibromyalgia   . IUD 2012   Mirena  . MTHFR gene mutation ([facility])     Past Surgical History:  Procedure Laterality Date  . COLONOSCOPY WITH PROPOFOL N/A 02/25/2017   Procedure: COLONOSCOPY WITH PROPOFOL;  Surgeon: William Outlaw, MD;  [facility];  Service: Endoscopy;  Laterality: N/A;  . left knee lateral release  1993  . scar tisue removal  03/2005   left ankle  . TONSILLECTOMY  age 28's    There were no vitals filed for this visit.  Subjective Assessment - 09/10/18 1536    Subjective  Pt reports she continues to perform HEP 1x/day.  She rode horse the other day, without difficulty.   Pt reports her Rt hip is a little less tight since DN. She wasn't sore afterwards.     Patient Stated Goals  try to get the hip feeling better     Currently in Pain?  No/denies    Pain Score  0-No pain         [facility] PT Assessment - 09/10/18 0001      Assessment   Medical Diagnosis  Rt hip pain     Referring Provider  Dr. Ganesh Kamath    Onset Date/Surgical Date  03/22/18   worse in the past 3-4  months present for > 1 year    Hand Dominance  Right    Next MD Visit  to schedule       Strength   Right Hip Flexion  --   5-/5   Right Hip Extension  4+/5    Right Hip ABduction  --   5-/5, with pain in adductors   Right Hip ADduction  4+/5        [facility] Adult PT Treatment/Exercise - 09/10/18 0001      Lumbar Exercises: Stretches   Hip Flexor Stretch  Right;Left;2 reps;30 seconds   seated   Quad Stretch  Right;Left;2 reps;30 seconds   prone with strap    Piriformis Stretch  Right;30 seconds;Left;3 reps   supine   Other Lumbar Stretch Exercise  supine butterfly stretch 20 sec x 3 reps       Lumbar Exercises: Aerobic   Tread Mill  1.8 mph, 5 min    PTA present to discuss progress     Moist Heat Therapy   Number Minutes Moist Heat  12 Minutes    Moist Heat Location  Lumbar Spine;Hip     Rt anterior hip      Electrical Stimulation   Electrical Stimulation Location  Rt piriformis/ Rt adductor    Electrical Stimulation Action  premod to each area    Electrical Stimulation Parameters  to tolerance    Electrical Stimulation Goals  Tone;Pain      Manual Therapy   Manual therapy comments  pt supine     Soft tissue mobilization  STM to Rt iliopsoas, cross fiber friction to Rt adductors.     Myofascial Release  manual work through the Rt adductors and medial hamstrings        Trigger Point Dry Needling - 09/10/18 1637    Consent Given?  Yes    Muscles Treated Lower Body  --   Rt with estim to adductors    Adductor Response  Palpable increased muscle length;Twitch response elicited    Hamstring Response  Palpable increased muscle length              Trigger point dry needling performed by supervising PT, Celyn Holt.   PT Long Term Goals - 09/10/18 1538      PT LONG TERM GOAL #1   Title  Decrease pain in Rt hip 50-75% allowing patient to perfrom normal functional activities with greater ease 09/27/18    Time  6    Period  Weeks    Status  On-going   50%  decrease, unless she moves the wrong way.      PT LONG TERM GOAL #2   Title  Increase functional strength Rt LE to 5-/5 to 5/5 throughout 09/27/18    Time  6    Period  Weeks    Status  Partially Met      PT LONG TERM GOAL #3   Title  Patient reports tolerating 10-15 min of walking on level surfaces 2-3 times/week 09/27/18    Time  6    Period  Weeks    Status  Partially Met      PT LONG TERM GOAL #4   Title  Independent in HEP 09/27/18    Time  6    Period  Weeks    Status  On-going      PT LONG TERM GOAL #5   Title  Iprove FOTO to </= 35% limitation 09/27/18    Time  6    Period  Weeks    Status  On-going            Plan - 09/10/18 1539    Clinical Impression Statement  Pt arrives painfree upon arrival.  She is unable to flex hip >90 deg (to get socks on) without pain.  Her Rt hip strength has improved.  She is able to complete supine butterfly stretch with greater ease, and increased ROM.   Less palpable tightness in Rt adductors. Good response to DN at last visit and improved tissue extensibility through Rt adductors and medial hamstrings.  Pt making good gains towards remaining goals.     Rehab Potential  Good    PT Frequency  2x / week    PT Duration  6 weeks    PT Treatment/Interventions  Patient/family education;ADLs/Self Care Home Management;Cryotherapy;Electrical Stimulation;Iontophoresis 57m/ml Dexamethasone;Moist Heat;Ultrasound;Dry needling;Manual techniques;Neuromuscular re-education;Therapeutic activities;Therapeutic exercise    PT Next Visit Plan  assess response to DN.  continue progressive stretching/strengthening program as tolerated.     PT Home Exercise Plan  add medial hamstring stretch to HEP    Consulted and Agree with Plan of Care  Patient       Patient will benefit from skilled therapeutic intervention in order to improve the following deficits and impairments:  Postural dysfunction, Improper body mechanics, Pain, Increased fascial restricitons,  Increased muscle spasms, Hypomobility, Decreased strength, Decreased mobility, Decreased range of motion, Decreased activity tolerance  Visit Diagnosis: Pain in right hip  Other symptoms and signs involving the musculoskeletal system  Muscle weakness (generalized)     Problem List Patient Active Problem List   Diagnosis Date Noted  . Decreased visual acuity 08/30/2018  . Right corneal abrasion 08/30/2018  . Elevated fasting glucose 04/08/2018  . Bipolar 1 disorder, depressed, severe ([place]) 01/25/2018  . Depression   . Anxiety 01/22/2018  . Borderline personality disorder ([place]) 01/22/2018  . Rheumatoid arthritis 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 ([place]) 12/09/2017  . TMJ (temporomandibular joint syndrome) 09/26/2017  . New daily persistent headache 09/26/2017  . MTHFR gene mutation ([place]) 09/20/2017  . Bipolar I disorder ([place]) 09/16/2017  . Fibromyalgia 09/16/2017  . Chronic fatigue syndrome 09/16/2017  . DYSPNEA 10/15/2011  . FEVER, RECURRENT 04/02/2009  . DERMATITIS, ALLERGIC 06/05/2008  . FATIGUE 05/05/2008  . HYPOTHYROIDISM 02/10/2008  . 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 FIBROMYALGIA 02/10/2008  . IRON, SERUM, ELEVATED 02/10/2008   Kerin Perna, PTA 09/10/18 4:41 PM'  Celyn P. Helene Kelp PT, MPH 09/10/18 4:42 PM   [facility] [place], [place], [address] Phone: [phone]   Fax:  [phone]  Name: JACQULINE TERRILL MRN: [id number] Date of Birth: [birth year]

## 2018-09-15 ENCOUNTER — Encounter: Payer: Self-pay | Admitting: Physical Therapy

## 2018-09-15 ENCOUNTER — Ambulatory Visit (INDEPENDENT_AMBULATORY_CARE_PROVIDER_SITE_OTHER): Payer: Self-pay | Admitting: Physical Therapy

## 2018-09-15 DIAGNOSIS — R29898 Other symptoms and signs involving the musculoskeletal system: Secondary | ICD-10-CM

## 2018-09-15 DIAGNOSIS — M25551 Pain in right hip: Secondary | ICD-10-CM

## 2018-09-15 DIAGNOSIS — M6281 Muscle weakness (generalized): Secondary | ICD-10-CM

## 2018-09-15 NOTE — Therapy (Addendum)
[place], [place], [address] Phone: [phone]   Fax:  [phone]  Physical Therapy Treatment  Patient Details  Name: Frances [name] MRN: [id number] Date of Birth: 1976-09-11 Referring Provider: Dr. Henry Russel   Encounter Date: 09/15/2018  PT End of Session - 09/15/18 0934    Visit Number  9    Number of Visits  12    Date for PT Re-Evaluation  09/27/18    PT Start Time  0846    PT Stop Time  0939    PT Time Calculation (min)  53 min    Behavior During Therapy  [facility] for tasks assessed/performed       Past Medical History:  Diagnosis Date  . Anxiety   . Arthritis   . Back pain, chronic   . Bipolar 1 disorder ([place])   . Chronic fatigue syndrome   . DDD (degenerative disc disease), lumbar   . Depression   . Fibromyalgia   . IUD 2012   Mirena  . MTHFR gene mutation [facility])     Past Surgical History:  Procedure Laterality Date  . COLONOSCOPY WITH PROPOFOL N/A 02/25/2017   Procedure: COLONOSCOPY WITH PROPOFOL;  Surgeon: Arta Silence, MD;  [facility]: [facility];  Service: Endoscopy;  Laterality: N/A;  . left knee lateral release  1993  . scar tisue removal  03/2005   left ankle  . TONSILLECTOMY  age 69's    There were no vitals filed for this visit.  Subjective Assessment - 09/15/18 0851    Subjective  "My fibro is bothering me today".  Pt reports she rode her horse on Saturday, which doesn't cause her pain during rides.  Yesterday, her Rt hip began to bother her again and it is still sore today. She is unsure what has aggrivated it.     Currently in Pain?  Yes    Pain Score  4     Pain Location  Generalized    Aggravating Factors   ?    Pain Relieving Factors  rest.          [facility] PT Assessment - 09/15/18 0001      Assessment   Medical Diagnosis  Rt hip pain     Referring Provider  Dr. Henry Russel    Onset Date/Surgical Date  03/22/18   worse in the past 3-4 months  present for > 1 year    Hand Dominance  Right    Next MD Visit  to schedule        [facility] Adult PT Treatment/Exercise - 09/15/18 0001      Exercises   Exercises  Lumbar      Lumbar Exercises: Stretches   Passive Hamstring Stretch  Right;4 reps;Left;2 reps;30 seconds; 3 rep on Rt for lateral hamstring    Piriformis Stretch  Right;30 seconds;Left;3 reps   supine   Other Lumbar Stretch Exercise  supine butterfly stretch 20 sec x 3 reps.  Calf stretch x 30 sec x 2 reps each side       Lumbar Exercises: Aerobic   Tread Mill  1.8 mph, 5 min    PTA present to discuss progress     Lumbar Exercises: Supine   Bridge  10 reps;5 seconds      Moist Heat Therapy   Number Minutes Moist Heat  15 Minutes    Moist Heat Location  Lumbar Spine;Hip   Rt post/anterior hip      Electrical Stimulation  Electrical Stimulation Location  Rt adductor/ Rt hamstring, piriformis    Electrical Stimulation Action  premod to each area    Electrical Stimulation Parameters  to tolerance    Electrical Stimulation Goals  Tone;Pain      Manual Therapy   Soft tissue mobilization  TPR to Rt piriformis/ obturator externus, Rt lateral prox hamstring with active/ passive ROM.  Cross fiber friction to Rt hamstring.     Myofascial Release  MFR to Rt ant / post prox thigh                  PT Long Term Goals - 09/10/18 1538      PT LONG TERM GOAL #1   Title  Decrease pain in Rt hip 50-75% allowing patient to perfrom normal functional activities with greater ease 09/27/18    Time  6    Period  Weeks    Status  On-going   50% decrease, unless she moves the wrong way.      PT LONG TERM GOAL #2   Title  Increase functional strength Rt LE to 5-/5 to 5/5 throughout 09/27/18    Time  6    Period  Weeks    Status  Partially Met      PT LONG TERM GOAL #3   Title  Patient reports tolerating 10-15 min of walking on level surfaces 2-3 times/week 09/27/18    Time  6    Period  Weeks    Status  Partially Met       PT LONG TERM GOAL #4   Title  Independent in HEP 09/27/18    Time  6    Period  Weeks    Status  On-going      PT LONG TERM GOAL #5   Title  Iprove FOTO to </= 35% limitation 09/27/18    Time  6    Period  Weeks    Status  On-going            Plan - 09/15/18 1125    Clinical Impression Statement  Pt continues to have fascial tightness in Rt lateral quad/hamstring as well as hip rotators.  Good release of tissue with manual therapy and stretches.  Pt increasing activity level outside of therapy; likely cause of increased hip tightness / pain today.      Rehab Potential  Good    PT Frequency  2x / week    PT Duration  6 weeks    PT Treatment/Interventions  Patient/family education;ADLs/Self Care Home Management;Cryotherapy;Electrical Stimulation;Iontophoresis 64m/ml Dexamethasone;Moist Heat;Ultrasound;Dry needling;Manual techniques;Neuromuscular re-education;Therapeutic activities;Therapeutic exercise    PT Next Visit Plan  continue progressive stretching/strengthening program as tolerated.     Consulted and Agree with Plan of Care  Patient       Patient will benefit from skilled therapeutic intervention in order to improve the following deficits and impairments:  Postural dysfunction, Improper body mechanics, Pain, Increased fascial restricitons, Increased muscle spasms, Hypomobility, Decreased strength, Decreased mobility, Decreased range of motion, Decreased activity tolerance  Visit Diagnosis: Pain in right hip  Other symptoms and signs involving the musculoskeletal system  Muscle weakness (generalized)     Problem List Patient Active Problem List   Diagnosis Date Noted  . Decreased visual acuity 08/30/2018  . Right corneal abrasion 08/30/2018  . Elevated fasting glucose 04/08/2018  . Bipolar 1 disorder, depressed, severe ([place] 01/25/2018  . Depression   . Anxiety 01/22/2018  . Borderline personality disorder ([place] 01/22/2018  . Rheumatoid arthritis  of  multiple sites 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 ([place]) 12/09/2017  . TMJ (temporomandibular joint syndrome) 09/26/2017  . New daily persistent headache 09/26/2017  . MTHFR gene mutation ([place]) 09/20/2017  . Bipolar I disorder ([place]) 09/16/2017  . Fibromyalgia 09/16/2017  . Chronic fatigue syndrome 09/16/2017  . DYSPNEA 10/15/2011  . FEVER, RECURRENT 04/02/2009  . DERMATITIS, ALLERGIC 06/05/2008  . FATIGUE 05/05/2008  . HYPOTHYROIDISM 02/10/2008  . 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 FIBROMYALGIA 02/10/2008  . IRON, SERUM, ELEVATED 02/10/2008   Kerin Perna, PTA 09/15/18 11:31 AM  [place], [place], [address] Phone: [phone]   Fax:  [phone]  Name: Frances [name] MRN: [id number] Date of Birth: [birth year]

## 2018-09-17 ENCOUNTER — Encounter: Payer: Self-pay | Admitting: Physical Therapy

## 2018-09-17 ENCOUNTER — Ambulatory Visit (INDEPENDENT_AMBULATORY_CARE_PROVIDER_SITE_OTHER): Payer: Self-pay | Admitting: Physical Therapy

## 2018-09-17 DIAGNOSIS — R29898 Other symptoms and signs involving the musculoskeletal system: Secondary | ICD-10-CM

## 2018-09-17 DIAGNOSIS — M25551 Pain in right hip: Secondary | ICD-10-CM

## 2018-09-17 DIAGNOSIS — M6281 Muscle weakness (generalized): Secondary | ICD-10-CM

## 2018-09-17 NOTE — Therapy (Signed)
[place], [place], [address] Phone: [phone]   Fax:  [phone]  Physical Therapy Treatment  Patient Details  Name: Frances [name] MRN: [id number] Date of Birth: 1976-10-15 Referring Provider (PT): Dr. Henry Russel   Encounter Date: 09/17/2018  PT End of Session - 09/17/18 1304    Visit Number  10    Number of Visits  12    Date for PT Re-Evaluation  09/27/18    PT Start Time  0933    PT Stop Time  1030    PT Time Calculation (min)  57 min    Activity Tolerance  Patient tolerated treatment well    Behavior During Therapy  [name] for tasks assessed/performed       Past Medical History:  Diagnosis Date  . Anxiety   . Arthritis   . Back pain, chronic   . Bipolar 1 disorder ([place])   . Chronic fatigue syndrome   . DDD (degenerative disc disease), lumbar   . Depression   . Fibromyalgia   . IUD 2012   Mirena  . MTHFR gene mutation [facility])     Past Surgical History:  Procedure Laterality Date  . COLONOSCOPY WITH PROPOFOL N/A 02/25/2017   Procedure: COLONOSCOPY WITH PROPOFOL;  Surgeon: Arta Silence, MD;  [facility];  Service: Endoscopy;  Laterality: N/A;  . left knee lateral release  1993  . scar tisue removal  03/2005   left ankle  . TONSILLECTOMY  age 48's    There were no vitals filed for this visit.  Subjective Assessment - 09/17/18 1302    Subjective  Pt reports she rode her horse again yesterday.  She had no pain during, but today she has more soreness.  She returns to her MD on Monday.      Currently in Pain?  Yes    Pain Score  3     Pain Location  Hip    Pain Orientation  Right;Anterior;Posterior    Aggravating Factors   pulling on socks    Pain Relieving Factors  rest, TENS, heat         [facility] PT Assessment - 09/17/18 0001      Strength   Right Hip ABduction  --   5-/5, with pain in adductors   Right Hip ADduction  4+/5      Palpation   SI  assessment    Rt ASIS slightly lower than Lt.         [place] Adult PT Treatment/Exercise - 09/17/18 0001      Lumbar Exercises: Stretches   Hip Flexor Stretch  Right;Left;30 seconds;3 reps   seated   Piriformis Stretch  Right;30 seconds;Left;3 reps   supine   Other Lumbar Stretch Exercise  supine butterfly stretch 20 sec x 3 reps       Lumbar Exercises: Aerobic   Tread Mill  1.8 mph, 5 min    PTA present to discuss progress     Lumbar Exercises: Supine   Bridge  10 reps;5 seconds      Moist Heat Therapy   Number Minutes Moist Heat  15 Minutes    Moist Heat Location  Lumbar Spine;Hip   Rt post/anterior hip      Electrical Stimulation   Electrical Stimulation Location  Rt adductor/ Rt hamstring, piriformis    Electrical Stimulation Action  premod to each area    Electrical Stimulation Parameters  to tolerance     Electrical Stimulation  Goals  Pain;Tone      Manual Therapy   Soft tissue mobilization  TPR to Rt adductors with active/ passive ROM, Rt lateral prox hamstring.  Cross fiber friction to Rt hamstring.     Muscle Energy Technique  MET to correct ant Rt rotated ilium x 1 sets.    Kinesiotex  --   I strip of reg rock tape to Rt lateral HS/glute insertion                 PT Long Term Goals - 09/10/18 1538      PT LONG TERM GOAL #1   Title  Decrease pain in Rt hip 50-75% allowing patient to perfrom normal functional activities with greater ease 09/27/18    Time  6    Period  Weeks    Status  On-going   50% decrease, unless she moves the wrong way.      PT LONG TERM GOAL #2   Title  Increase functional strength Rt LE to 5-/5 to 5/5 throughout 09/27/18    Time  6    Period  Weeks    Status  Partially Met      PT LONG TERM GOAL #3   Title  Patient reports tolerating 10-15 min of walking on level surfaces 2-3 times/week 09/27/18    Time  6    Period  Weeks    Status  Partially Met      PT LONG TERM GOAL #4   Title  Independent in HEP 09/27/18    Time   6    Period  Weeks    Status  On-going      PT LONG TERM GOAL #5   Title  Iprove FOTO to </= 35% limitation 09/27/18    Time  6    Period  Weeks    Status  On-going            Plan - 09/17/18 1305    Clinical Impression Statement  Pt's pelvis alignment slightly off today; improved with MET correction.  Tightness and tenderness in Rt adductors at pubic bone and Rt glute insertion/lateral hamstring.  Improvement in pain and tightness at end of session.  No new goals met since latest flare up of pain.  Encouraged pt to avoid aggrivating activities (ie: horse riding) to calm hip down.     Rehab Potential  Good    PT Frequency  2x / week    PT Duration  6 weeks    PT Treatment/Interventions  Patient/family education;ADLs/Self Care Home Management;Cryotherapy;Electrical Stimulation;Iontophoresis '4mg'$ /ml Dexamethasone;Moist Heat;Ultrasound;Dry needling;Manual techniques;Neuromuscular re-education;Therapeutic activities;Therapeutic exercise    PT Next Visit Plan  continue progressive stretching/strengthening program as tolerated. await further advisement from MD.     Consulted and Agree with Plan of Care  Patient       Patient will benefit from skilled therapeutic intervention in order to improve the following deficits and impairments:  Postural dysfunction, Improper body mechanics, Pain, Increased fascial restricitons, Increased muscle spasms, Hypomobility, Decreased strength, Decreased mobility, Decreased range of motion, Decreased activity tolerance  Visit Diagnosis: Pain in right hip  Other symptoms and signs involving the musculoskeletal system  Muscle weakness (generalized)     Problem List Patient Active Problem List   Diagnosis Date Noted  . Decreased visual acuity 08/30/2018  . Right corneal abrasion 08/30/2018  . Elevated fasting glucose 04/08/2018  . Bipolar 1 disorder, depressed, severe ([place]) 01/25/2018  . Depression   . Anxiety 01/22/2018  . Borderline personality  disorder ([place]) 01/22/2018  . Rheumatoid arthritis 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 ([place]) 12/09/2017  . TMJ (temporomandibular joint syndrome) 09/26/2017  . New daily persistent headache 09/26/2017  . MTHFR gene mutation ([place]) 09/20/2017  . Bipolar I disorder ([place]) 09/16/2017  . Fibromyalgia 09/16/2017  . Chronic fatigue syndrome 09/16/2017  . DYSPNEA 10/15/2011  . FEVER, RECURRENT 04/02/2009  . DERMATITIS, ALLERGIC 06/05/2008  . FATIGUE 05/05/2008  . HYPOTHYROIDISM 02/10/2008  . 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 FIBROMYALGIA 02/10/2008  . IRON, SERUM, ELEVATED 02/10/2008   Kerin Perna, PTA 09/17/18 1:11 PM  [place], [place], [address] Phone: [phone]   Fax:  [phone]  Name: Frances [name] MRN: [id number] Date of Birth: 17-Jun-1976

## 2018-09-20 ENCOUNTER — Ambulatory Visit: Admit: 2018-09-20 | Discharge: 2018-09-21 | Attending: Family Medicine | Primary: Family Medicine

## 2018-09-20 DIAGNOSIS — M1611 Unilateral primary osteoarthritis, right hip: Principal | ICD-10-CM

## 2018-09-22 ENCOUNTER — Ambulatory Visit (INDEPENDENT_AMBULATORY_CARE_PROVIDER_SITE_OTHER): Payer: Self-pay | Admitting: Physical Therapy

## 2018-09-22 DIAGNOSIS — M6281 Muscle weakness (generalized): Secondary | ICD-10-CM

## 2018-09-22 DIAGNOSIS — M25551 Pain in right hip: Secondary | ICD-10-CM

## 2018-09-22 DIAGNOSIS — R29898 Other symptoms and signs involving the musculoskeletal system: Secondary | ICD-10-CM

## 2018-09-22 NOTE — Therapy (Signed)
[place], [place], [address] Phone: [phone]   Fax:  [phone]  Physical Therapy Treatment  Patient Details  Name: Frances [name] MRN: [id number] Date of Birth: 28-Sep-1976 Referring Provider (PT): Dr. Henry Russel   Encounter Date: 09/22/2018  PT End of Session - 09/22/18 0943    Visit Number  11    Number of Visits  12    Date for PT Re-Evaluation  09/27/18    PT Start Time  0931    PT Stop Time  1032    PT Time Calculation (min)  61 min       Past Medical History:  Diagnosis Date  . Anxiety   . Arthritis   . Back pain, chronic   . Bipolar 1 disorder ([place])   . Chronic fatigue syndrome   . DDD (degenerative disc disease), lumbar   . Depression   . Fibromyalgia   . IUD 2012   Mirena  . MTHFR gene mutation [facility])     Past Surgical History:  Procedure Laterality Date  . COLONOSCOPY WITH PROPOFOL N/A 02/25/2017   Procedure: COLONOSCOPY WITH PROPOFOL;  Surgeon: Arta Silence, MD;  [facility];  Service: Endoscopy;  Laterality: N/A;  . left knee lateral release  1993  . scar tisue removal  03/2005   left ankle  . TONSILLECTOMY  age 75's    There were no vitals filed for this visit.  Subjective Assessment - 09/22/18 0936    Subjective  Pt reports she visited with MD; she states that he said she is stronger than last visit, to keep rehabing hip, and ice if needed. She did a lot of driving over last 5 days; front of hip has been more aggrivated.  She has been icing 2x/day for pain relief.     Currently in Pain?  Yes    Pain Score  2     Pain Location  Hip    Pain Orientation  Right;Anterior;Posterior    Pain Descriptors / Indicators  Dull;Aching    Aggravating Factors   driving; pulling on socks    Pain Relieving Factors  rest, TENS, heat.          [facility] PT Assessment - 09/22/18 0001      Assessment   Medical Diagnosis  Rt hip pain     Referring Provider (PT)   Dr. Henry Russel    Onset Date/Surgical Date  03/22/18   worse in the past 3-4 months present for > 1 year    Hand Dominance  Right    Next MD Visit  to schedule       Palpation   SI assessment    Rt ASIS lower than Lt. Rt ilium higher than Lt in standing.        [place] Adult PT Treatment/Exercise - 09/22/18 0001      Self-Care   Other Self-Care Comments   pt educated on how to palpate ASIS and top of ilium to self-check alignment; pt verbalized understanding and returned dmo. Pt educate on self MET for ant rotated ilium on Rt; pt returned demo with cues.       Exercises   Exercises  Knee/Hip;Lumbar      Lumbar Exercises: Stretches   Passive Hamstring Stretch  Right;3 reps;30 seconds    Hip Flexor Stretch  Right;3 reps;30 seconds    Piriformis Stretch  Right;30 seconds;Left;2 reps   supine   Other Lumbar Stretch Exercise  supine  butterfly stretch 20 sec x 3 reps     Other Lumbar Stretch Exercise  Rt adductor stretch x 20  sec x 2 reps; ITB stretch x 30 sec x 2 reps       Lumbar Exercises: Aerobic   Tread Mill  1.8 mph, 6 min    PTA present to discuss progress     Lumbar Exercises: Supine   Bridge  10 reps;5 seconds      Lumbar Exercises: Prone   Opposite Arm/Leg Raise  Right arm/Left leg;Left arm/Right leg;10 reps   with axial ext     Moist Heat Therapy   Number Minutes Moist Heat  15 Minutes    Moist Heat Location  Lumbar Spine;Hip   Rt post/anterior hip      Electrical Stimulation   Electrical Stimulation Location  Rt adductor/ Rt hamstring, piriformis    Electrical Stimulation Action  premod to each area    Electrical Stimulation Parameters  to tolerance     Electrical Stimulation Goals  Pain;Tone      Manual Therapy   Soft tissue mobilization  TPR to Rt piriformis/ obturator externus, Rt lateral prox hamstring with active/ passive ROM.  Cross fiber friction to Rt hamstring.     Muscle Energy Technique  MET to correct ant Rt rotated ilium x 2 sets, MET to correct  elevated Rt ilium x 2 sets    Kinesiotex  --   I strip of reg rock tape to Rt lateral HS/glute insertion                 PT Long Term Goals - 09/10/18 1538      PT LONG TERM GOAL #1   Title  Decrease pain in Rt hip 50-75% allowing patient to perfrom normal functional activities with greater ease 09/27/18    Time  6    Period  Weeks    Status  On-going   50% decrease, unless she moves the wrong way.      PT LONG TERM GOAL #2   Title  Increase functional strength Rt LE to 5-/5 to 5/5 throughout 09/27/18    Time  6    Period  Weeks    Status  Partially Met      PT LONG TERM GOAL #3   Title  Patient reports tolerating 10-15 min of walking on level surfaces 2-3 times/week 09/27/18    Time  6    Period  Weeks    Status  Partially Met      PT LONG TERM GOAL #4   Title  Independent in HEP 09/27/18    Time  6    Period  Weeks    Status  On-going      PT LONG TERM GOAL #5   Title  Iprove FOTO to </= 35% limitation 09/27/18    Time  6    Period  Weeks    Status  On-going            Plan - 09/22/18 1007    Clinical Impression Statement  Pelvic asymmetries noted today; some improvement with MET corrections.  Pt tolerated all exercises well and reports she is now compliant with HEP on daily basis.  Encouraged pt to limit horseback riding and driving, which has agrivated her symptoms.  Pt has partially met her goals.     Rehab Potential  Good    PT Frequency  2x / week    PT Duration  6 weeks  PT Treatment/Interventions  Patient/family education;ADLs/Self Care Home Management;Cryotherapy;Electrical Stimulation;Iontophoresis 70m/ml Dexamethasone;Moist Heat;Ultrasound;Dry needling;Manual techniques;Neuromuscular re-education;Therapeutic activities;Therapeutic exercise    PT Next Visit Plan  assess goals and readiness to d/c.  FOTO.     Consulted and Agree with Plan of Care  Patient       Patient will benefit from skilled therapeutic intervention in order to improve the  following deficits and impairments:  Postural dysfunction, Improper body mechanics, Pain, Increased fascial restricitons, Increased muscle spasms, Hypomobility, Decreased strength, Decreased mobility, Decreased range of motion, Decreased activity tolerance  Visit Diagnosis: Pain in right hip  Other symptoms and signs involving the musculoskeletal system  Muscle weakness (generalized)     Problem List Patient Active Problem List   Diagnosis Date Noted  . Decreased visual acuity 08/30/2018  . Right corneal abrasion 08/30/2018  . Elevated fasting glucose 04/08/2018  . Bipolar 1 disorder, depressed, severe ([place] 01/25/2018  . Depression   . Anxiety 01/22/2018  . Borderline personality disorder ([place] 01/22/2018  . Rheumatoid arthritis 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 ([place] 12/09/2017  . TMJ (temporomandibular joint syndrome) 09/26/2017  . New daily persistent headache 09/26/2017  . MTHFR gene mutation ([place] 09/20/2017  . Bipolar I disorder ([place] 09/16/2017  . Fibromyalgia 09/16/2017  . Chronic fatigue syndrome 09/16/2017  . DYSPNEA 10/15/2011  . FEVER, RECURRENT 04/02/2009  . DERMATITIS, ALLERGIC 06/05/2008  . FATIGUE 05/05/2008  . HYPOTHYROIDISM 02/10/2008  . 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 FIBROMYALGIA 02/10/2008  . IRON, SERUM, ELEVATED 02/10/2008    CShelbie Hutching10/01/2018, 12:46 PM  [facility] [address]Phone: [phone]  Fax:  [phone] Name: Frances MARTERMRN: [id number] of Birth: [birth year]

## 2018-09-24 ENCOUNTER — Encounter: Payer: Self-pay | Admitting: Physical Therapy

## 2018-09-30 ENCOUNTER — Ambulatory Visit (INDEPENDENT_AMBULATORY_CARE_PROVIDER_SITE_OTHER): Payer: Self-pay | Admitting: Rehabilitative and Restorative Service Providers"

## 2018-09-30 ENCOUNTER — Encounter: Payer: Self-pay | Admitting: Rehabilitative and Restorative Service Providers"

## 2018-09-30 DIAGNOSIS — M6281 Muscle weakness (generalized): Secondary | ICD-10-CM

## 2018-09-30 DIAGNOSIS — R29898 Other symptoms and signs involving the musculoskeletal system: Secondary | ICD-10-CM

## 2018-09-30 DIAGNOSIS — M25551 Pain in right hip: Secondary | ICD-10-CM

## 2018-09-30 NOTE — Therapy (Addendum)
[place], [place], [address] Phone: [phone]   Fax:  [phone]  Physical Therapy Treatment  Patient Details  Name: Frances [name] MRN: [id number] Date of Birth: 11/13/1976 Referring Provider (PT): Dr. Henry Russel   Encounter Date: 09/30/2018  PT End of Session - 09/30/18 1406    Visit Number  12    Number of Visits  12    Date for PT Re-Evaluation  09/27/18    PT Start Time  1402    PT Stop Time  1500    PT Time Calculation (min)  58 min    Activity Tolerance  Patient tolerated treatment well       Past Medical History:  Diagnosis Date  . Anxiety   . Arthritis   . Back pain, chronic   . Bipolar 1 disorder ([place])   . Chronic fatigue syndrome   . DDD (degenerative disc disease), lumbar   . Depression   . Fibromyalgia   . IUD 2012   Mirena  . MTHFR gene mutation [facility])     Past Surgical History:  Procedure Laterality Date  . COLONOSCOPY WITH PROPOFOL N/A 02/25/2017   Procedure: COLONOSCOPY WITH PROPOFOL;  Surgeon: Arta Silence, MD;  Location: [facility];  Service: Endoscopy;  Laterality: N/A;  . left knee lateral release  1993  . scar tisue removal  03/2005   left ankle  . TONSILLECTOMY  age 8's    There were no vitals filed for this visit.  Subjective Assessment - 09/30/18 1407    Subjective  Still having some pain which is frustrating. Can ride horses per MD and does not ride that often. She is consistent with her HEP. She does a good bit of driving which is off and on - seems to irritate symptoms when she does drive but has not coalmed down since she has not been driving.     Currently in Pain?  Yes    Pain Score  3     Pain Location  Hip    Pain Orientation  Right;Anterior;Posterior    Pain Descriptors / Indicators  Dull;Aching    Aggravating Factors   driving; bending hip; sometimes walking; pulling on socks    Pain Relieving Factors  rest; TENS; heat           [facility] PT Assessment - 09/30/18 0001      Assessment   Medical Diagnosis  Rt hip pain     Referring Provider (PT)  Dr. Henry Russel    Onset Date/Surgical Date  03/22/18   worse in the past 3-4 months present for > 1 year    Hand Dominance  Right    Next MD Visit  PRN       Observation/Other Assessments   Focus on Therapeutic Outcomes (FOTO)   34% limitation       Strength   Right Hip ABduction  --   5-/5   Right Hip ADduction  --   5-/5     Palpation   Palpation comment  muscular tightness Rt psoas and hip flexor musculature; piriformis; glut med/min - minimal tightness Lt compared to Rt                    [facility] Adult PT Treatment/Exercise - 09/30/18 0001      Lumbar Exercises: Stretches   Passive Hamstring Stretch  Right;3 reps;30 seconds    Hip Flexor Stretch  Right;3 reps;30 seconds  Piriformis Stretch  Right;30 seconds;Left;2 reps   supine   Other Lumbar Stretch Exercise  supine butterfly stretch 20 sec x 3 reps     Other Lumbar Stretch Exercise  Rt adductor stretch x 20  sec x 2 reps; ITB stretch x 30 sec x 2 reps       Lumbar Exercises: Aerobic   Tread Mill  1.8 mph, 5 min    PTA present to discuss progress     Moist Heat Therapy   Number Minutes Moist Heat  15 Minutes    Moist Heat Location  Lumbar Spine;Hip   Rt post/anterior hip      Electrical Stimulation   Electrical Stimulation Location  Rt adductor/ Rt hamstring, piriformis    Electrical Stimulation Action  premod to each area    Electrical Stimulation Parameters  to tolerance     Electrical Stimulation Goals  Pain;Tone      Manual Therapy   Soft tissue mobilization  TPR to Rt piriformis/ obturator externus, Rt lateral prox hamstring with active/ passive ROM.  Cross fiber friction to Rt hamstring.     Myofascial Release  posterior Rt hip     Kinesiotex  --   I strip of reg rock tape to Rt lateral HS/glute insertion      Trigger Point Dry Needling - 09/30/18 1437    Consent  Given?  Yes    Muscles Treated Lower Body  --   Rt posterior hip with estim    Gluteus Maximus Response  Palpable increased muscle length    Gluteus Minimus Response  Palpable increased muscle length    Piriformis Response  Palpable increased muscle length           PT Education - 09/30/18 1442    Education Details  DN    Person(s) Educated  Patient    Methods  Explanation    Comprehension  Verbalized understanding          PT Long Term Goals - 09/30/18 1434      PT LONG TERM GOAL #1   Title  Decrease pain in Rt hip 50-75% allowing patient to perfrom normal functional activities with greater ease 09/27/18    Time  6    Period  Weeks    Status  Partially Met      PT LONG TERM GOAL #2   Title  Increase functional strength Rt LE to 5-/5 to 5/5 throughout 09/27/18    Time  6    Period  Weeks    Status  Partially Met      PT LONG TERM GOAL #3   Title  Patient reports tolerating 10-15 min of walking on level surfaces 2-3 times/week 09/27/18    Time  6    Period  Weeks    Status  Partially Met      PT LONG TERM GOAL #4   Title  Independent in HEP 09/27/18    Time  6    Period  Weeks    Status  Achieved      PT LONG TERM GOAL #5   Title  Iprove FOTO to </= 35% limitation 09/27/18    Time  6    Period  Weeks    Status  Achieved            Plan - 09/30/18 1413    Clinical Impression Statement  Overall improvement. Patient continues to have some pain of variable intensity. She has increased mobility/ROm and strength Rt hip/bilat  LE's. Paitent has some continued palpable tightness but this has improved. She hsa incresaed her activity level and is now riding her horse again wth no pain. Goals of therapy have been partially accomplished.     Rehab Potential  Good    PT Frequency  2x / week    PT Duration  6 weeks    PT Treatment/Interventions  Patient/family education;ADLs/Self Care Home Management;Cryotherapy;Electrical Stimulation;Iontophoresis 8m/ml  Dexamethasone;Moist Heat;Ultrasound;Dry needling;Manual techniques;Neuromuscular re-education;Therapeutic activities;Therapeutic exercise    PT Next Visit Plan  place on hold x 2 weeks - patient will continue with independent HEP     PT Home Exercise Plan  -    Consulted and Agree with Plan of Care  Patient       Patient will benefit from skilled therapeutic intervention in order to improve the following deficits and impairments:  Postural dysfunction, Improper body mechanics, Pain, Increased fascial restricitons, Increased muscle spasms, Hypomobility, Decreased strength, Decreased mobility, Decreased range of motion, Decreased activity tolerance  Visit Diagnosis: Pain in right hip  Other symptoms and signs involving the musculoskeletal system  Muscle weakness (generalized)     Problem List Patient Active Problem List   Diagnosis Date Noted  . Decreased visual acuity 08/30/2018  . Right corneal abrasion 08/30/2018  . Elevated fasting glucose 04/08/2018  . Bipolar 1 disorder, depressed, severe ([place] 01/25/2018  . Depression   . Anxiety 01/22/2018  . Borderline personality disorder ([place] 01/22/2018  . Rheumatoid arthritis 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 ([place] 12/09/2017  . TMJ (temporomandibular joint syndrome) 09/26/2017  . New daily persistent headache 09/26/2017  . MTHFR gene mutation ([place] 09/20/2017  . Bipolar I disorder ([place] 09/16/2017  . Fibromyalgia 09/16/2017  . Chronic fatigue syndrome 09/16/2017  . DYSPNEA 10/15/2011  . FEVER, RECURRENT 04/02/2009  . DERMATITIS, ALLERGIC 06/05/2008  . FATIGUE 05/05/2008  . HYPOTHYROIDISM 02/10/2008  . 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 FIBROMYALGIA 02/10/2008  . IRON, SERUM, ELEVATED 02/10/2008    Numa Schroeter PNilda SimmerPT, MPH  09/30/2018, 2:49 PM  [facility] [address]  [address]Phone: [phone]  Fax:  [phone] Name: PANAGHA LOSEKEMRN: [id number] of Birth: [birth year] PHYSICAL THERAPY DISCHARGE SUMMARY  Visits from Start of Care: 12  Current functional level related to goals / functional outcomes: See progress note 09/30/18 for discharge status    Remaining deficits: Continued intermittent pain    Education / Equipment: HEP  Plan: Patient agrees to discharge.  Patient goals were partially met. Patient is being discharged due to being pleased with the current functional level.  ?????    France Lusty P. HHelene KelpPT, MPH 10/21/18 4:41 PM

## 2018-09-30 NOTE — Patient Instructions (Signed)

## 2018-10-06 MED ORDER — ADALIMUMAB PEN CITRATE FREE 40 MG/0.4 ML
SUBCUTANEOUS | 3 refills | 0 days | Status: CP
Start: 2018-10-06 — End: 2019-01-24

## 2018-10-13 ENCOUNTER — Telehealth: Payer: Self-pay | Admitting: Physician Assistant

## 2018-10-13 ENCOUNTER — Ambulatory Visit (INDEPENDENT_AMBULATORY_CARE_PROVIDER_SITE_OTHER): Payer: Self-pay | Admitting: Physician Assistant

## 2018-10-13 DIAGNOSIS — Z23 Encounter for immunization: Secondary | ICD-10-CM

## 2018-10-13 NOTE — Telephone Encounter (Signed)
Advised pt ok per provider to receive the pneumococcal 23 vaccine. Pt stated she would call and schedule nurse visit when she is ready to get vaccination.

## 2018-10-13 NOTE — Telephone Encounter (Signed)
Ok to get pneumococcal 23.

## 2018-10-13 NOTE — Telephone Encounter (Signed)
Pt came in today for flu vaccine. She stated the endocrinologist recommended she receive pneumococcal vaccine 23. She will be starting humara this week. Advised we would forward message to her provider to put in order and if approved we can call to schedule nurse visit. Pt did not wish to recieve today. She received the prevnar 13 04/07/2018

## 2018-10-20 MED ORDER — HYDROXYCHLOROQUINE 200 MG TABLET
ORAL_TABLET | ORAL | 2 refills | 0 days | Status: CP
Start: 2018-10-20 — End: 2019-01-24
  Filled 2018-10-21: qty 60, 30d supply, fill #0

## 2018-10-21 MED FILL — HYDROXYCHLOROQUINE 200 MG TABLET: 30 days supply | Qty: 60 | Fill #0 | Status: AC

## 2018-10-25 ENCOUNTER — Ambulatory Visit: Admit: 2018-10-25 | Discharge: 2018-10-26 | Attending: Family Medicine | Primary: Family Medicine

## 2018-10-25 DIAGNOSIS — M1611 Unilateral primary osteoarthritis, right hip: Principal | ICD-10-CM

## 2018-10-29 ENCOUNTER — Other Ambulatory Visit: Payer: Self-pay | Admitting: Physician Assistant

## 2018-11-01 ENCOUNTER — Other Ambulatory Visit: Payer: Self-pay | Admitting: Physician Assistant

## 2018-11-10 ENCOUNTER — Other Ambulatory Visit: Payer: Self-pay | Admitting: Physician Assistant

## 2018-11-23 MED FILL — HYDROXYCHLOROQUINE 200 MG TABLET: 30 days supply | Qty: 60 | Fill #1 | Status: AC

## 2018-11-23 MED FILL — HYDROXYCHLOROQUINE 200 MG TABLET: ORAL | 30 days supply | Qty: 60 | Fill #1

## 2018-12-09 ENCOUNTER — Ambulatory Visit (INDEPENDENT_AMBULATORY_CARE_PROVIDER_SITE_OTHER): Payer: Self-pay | Admitting: Family Medicine

## 2018-12-09 VITALS — Temp 98.0°F | Ht 67.0 in | Wt 176.0 lb

## 2018-12-09 DIAGNOSIS — Z23 Encounter for immunization: Secondary | ICD-10-CM

## 2018-12-09 NOTE — Progress Notes (Signed)
Patient is here today for a Pneumovax 23 vaccination. Patient temperature is 98.0 (oral). Patient tolerated injection well without complications in the left deltoid. No further questions or concerns at this time. Frances [name], CMA.

## 2018-12-27 MED FILL — HYDROXYCHLOROQUINE 200 MG TABLET: ORAL | 30 days supply | Qty: 60 | Fill #2

## 2018-12-27 MED FILL — HYDROXYCHLOROQUINE 200 MG TABLET: 30 days supply | Qty: 60 | Fill #2 | Status: AC

## 2019-01-04 ENCOUNTER — Other Ambulatory Visit: Payer: Self-pay | Admitting: Physician Assistant

## 2019-01-12 ENCOUNTER — Ambulatory Visit: Admit: 2019-01-12 | Discharge: 2019-01-13 | Attending: Family Medicine | Primary: Family Medicine

## 2019-01-12 DIAGNOSIS — M7611 Psoas tendinitis, right hip: Principal | ICD-10-CM

## 2019-01-12 DIAGNOSIS — M1611 Unilateral primary osteoarthritis, right hip: Secondary | ICD-10-CM

## 2019-01-24 ENCOUNTER — Ambulatory Visit: Admit: 2019-01-24 | Discharge: 2019-01-25

## 2019-01-24 DIAGNOSIS — M1990 Unspecified osteoarthritis, unspecified site: Principal | ICD-10-CM

## 2019-01-24 MED ORDER — HYDROXYCHLOROQUINE 200 MG TABLET
ORAL_TABLET | ORAL | 4 refills | 0 days | Status: CP
Start: 2019-01-24 — End: 2019-06-27
  Filled 2019-01-25: qty 60, 30d supply, fill #0

## 2019-01-24 MED ORDER — HUMIRA PEN CITRATE FREE 40 MG/0.4 ML
SUBCUTANEOUS | 4 refills | 0 days | Status: CP
Start: 2019-01-24 — End: ?

## 2019-01-25 MED FILL — HYDROXYCHLOROQUINE 200 MG TABLET: 30 days supply | Qty: 60 | Fill #0 | Status: AC

## 2019-02-11 ENCOUNTER — Ambulatory Visit: Admit: 2019-02-11 | Discharge: 2019-02-12 | Attending: Family Medicine | Primary: Family Medicine

## 2019-02-11 DIAGNOSIS — M7611 Psoas tendinitis, right hip: Principal | ICD-10-CM

## 2019-02-17 ENCOUNTER — Ambulatory Visit: Admit: 2019-02-17 | Discharge: 2019-02-18

## 2019-02-17 DIAGNOSIS — Z79899 Other long term (current) drug therapy: Principal | ICD-10-CM

## 2019-02-24 MED FILL — HYDROXYCHLOROQUINE 200 MG TABLET: ORAL | 90 days supply | Qty: 180 | Fill #1

## 2019-02-24 MED FILL — HYDROXYCHLOROQUINE 200 MG TABLET: 90 days supply | Qty: 180 | Fill #1 | Status: AC

## 2019-04-11 ENCOUNTER — Other Ambulatory Visit: Payer: Self-pay

## 2019-04-11 MED ORDER — CETIRIZINE HCL 10 MG PO TABS: 10.0000 mg | ORAL_TABLET | Freq: Every day | ORAL | 2 refills | Status: DC

## 2019-04-11 NOTE — Telephone Encounter (Signed)
Requesting RF on Cetirizine.   Last OV with PCP 04/08/18  #[phone] sent with note that pt needs appt

## 2019-05-09 ENCOUNTER — Other Ambulatory Visit: Payer: Self-pay | Admitting: Physician Assistant

## 2019-05-10 NOTE — Telephone Encounter (Signed)
Appt made for tomorrow, will address refills at that time.

## 2019-05-11 ENCOUNTER — Ambulatory Visit (INDEPENDENT_AMBULATORY_CARE_PROVIDER_SITE_OTHER): Payer: Self-pay | Admitting: Physician Assistant

## 2019-05-11 ENCOUNTER — Encounter: Payer: Self-pay | Admitting: Physician Assistant

## 2019-05-11 VITALS — BP 110/79 | HR 100 | Temp 98.4°F | Ht 67.0 in | Wt 174.0 lb

## 2019-05-11 DIAGNOSIS — Z0000 Encounter for general adult medical examination without abnormal findings: Secondary | ICD-10-CM

## 2019-05-11 DIAGNOSIS — Z1231 Encounter for screening mammogram for malignant neoplasm of breast: Secondary | ICD-10-CM

## 2019-05-11 DIAGNOSIS — M0609 Rheumatoid arthritis without rheumatoid factor, multiple sites: Secondary | ICD-10-CM

## 2019-05-11 DIAGNOSIS — F603 Borderline personality disorder: Secondary | ICD-10-CM

## 2019-05-11 DIAGNOSIS — E559 Vitamin D deficiency, unspecified: Secondary | ICD-10-CM

## 2019-05-11 DIAGNOSIS — R5382 Chronic fatigue, unspecified: Secondary | ICD-10-CM

## 2019-05-11 DIAGNOSIS — G9332 Myalgic encephalomyelitis/chronic fatigue syndrome: Secondary | ICD-10-CM

## 2019-05-11 DIAGNOSIS — Z13220 Encounter for screening for lipoid disorders: Secondary | ICD-10-CM

## 2019-05-11 DIAGNOSIS — E039 Hypothyroidism, unspecified: Secondary | ICD-10-CM

## 2019-05-11 DIAGNOSIS — F314 Bipolar disorder, current episode depressed, severe, without psychotic features: Secondary | ICD-10-CM

## 2019-05-11 DIAGNOSIS — Z131 Encounter for screening for diabetes mellitus: Secondary | ICD-10-CM

## 2019-05-11 DIAGNOSIS — R5383 Other fatigue: Secondary | ICD-10-CM

## 2019-05-11 MED ORDER — MELOXICAM 15 MG PO TABS: ORAL_TABLET | ORAL | 2 refills | Status: DC

## 2019-05-11 MED ORDER — MONTELUKAST SODIUM 10 MG PO TABS: 10.0000 mg | ORAL_TABLET | Freq: Every day | ORAL | 4 refills | Status: DC

## 2019-05-11 NOTE — Progress Notes (Signed)
Subjective:     Frances [name] is a 43 y.o. female and is here for a comprehensive physical exam. The patient reports no problems.  Pt is doing really good. She likes her psychiatrist and counselor. Other than being really tired all the time and aches and pains from RA. She feels good "in her head".   Social History   Socioeconomic History  . Marital status: Single    Spouse name: Not on file  . Number of children: 0  . Years of education: Not on file  . Highest education level: Bachelor's degree (e.g., BA, AB, BS)  Occupational History  . Occupation: Designer, industrial/product at times  Social Needs  . Financial resource strain: Not on file  . Food insecurity:    Worry: Not on file    Inability: Not on file  . Transportation needs:    Medical: Not on file    Non-medical: Not on file  Tobacco Use  . Smoking status: Never Smoker  . Smokeless tobacco: Never Used  Substance and Sexual Activity  . Alcohol use: No    Alcohol/week: 0.0 standard drinks  . Drug use: No  . Sexual activity: Yes    Birth control/protection: I.U.D.  Lifestyle  . Physical activity:    Days per week: Not on file    Minutes per session: Not on file  . Stress: Not on file  Relationships  . Social connections:    Talks on phone: Not on file    Gets together: Not on file    Attends religious service: Not on file    Active member of club or organization: Not on file    Attends meetings of clubs or organizations: Not on file    Relationship status: Not on file  . Intimate partner violence:    Fear of current or ex partner: Not on file    Emotionally abused: Not on file    Physically abused: Not on file    Forced sexual activity: Not on file  Other Topics Concern  . Not on file  Social History Narrative   Originally from [place], outside of [place].    Moved here permanently 2012.   Intermittently worked on a farm.   Lives with many cats--3 plus fosters cats.    Single, lives alone in a one story home.  Rarely drinks caffeine. 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   Health Maintenance  Topic Date Due  . HIV Screening  05/10/2020 (Originally [birth year]1989)  . INFLUENZA VACCINE  07/23/2019  . MAMMOGRAM  05/05/2020  . PAP SMEAR-Modifier  04/08/2021  . TETANUS/TDAP  02/05/2028    The following portions of the patient's history were reviewed and updated as appropriate: allergies, current medications, past family history, past medical history, past social history, past surgical history and problem list.  Review of Systems Pertinent items noted in HPI and remainder of comprehensive ROS otherwise negative.   Objective:    BP 110/79   Pulse 100   Temp 98.4 F (36.9 C) (Oral)   Ht 5\' 7"  (1.702 m)   Wt 174 lb (78.9 kg)   SpO2 96%   BMI 27.25 kg/m  General appearance: alert, cooperative and appears stated age Head: Normocephalic, without obvious abnormality, atraumatic Eyes: conjunctivae/corneas clear. PERRL, EOM's intact. Fundi benign. Ears: normal TM's and external ear canals both ears Nose: Nares normal. Septum midline. Mucosa normal. No drainage or sinus tenderness. Throat: lips, mucosa, and tongue normal; teeth and gums normal Neck:  no adenopathy, no carotid bruit, no JVD, supple, symmetrical, trachea midline and thyroid not enlarged, symmetric, no tenderness/mass/nodules Back: symmetric, no curvature. ROM normal. No CVA tenderness. Lungs: clear to auscultation bilaterally Heart: regular rate and rhythm, S1, S2 normal, no murmur, click, rub or gallop Abdomen: soft, non-tender; bowel sounds normal; no masses,  no organomegaly Extremities: extremities normal, atraumatic, no cyanosis or edema Pulses: 2+ and symmetric Skin: Skin color, texture, turgor normal. No rashes or lesions Lymph nodes: Cervical, supraclavicular, and axillary nodes normal. Neurologic: Alert and oriented X 3, normal strength and tone. Normal symmetric reflexes. Normal coordination and  gait    Assessment:    Healthy female exam.      Plan:    Marland KitchenMarland KitchenZayda was seen today for annual exam.  Diagnoses and all orders for this visit:  Routine physical examination -     Lipid Panel w/reflex Direct LDL -     COMPLETE METABOLIC PANEL WITH GFR -     Hemoglobin A1c -     VITAMIN D 25 Hydroxy (Vit-D Deficiency, Fractures) -     B12 -     TSH -     CBC with Differential/Platelet -     Ferritin  Screening for lipid disorders -     Lipid Panel w/reflex Direct LDL  Screening for diabetes mellitus -     COMPLETE METABOLIC PANEL WITH GFR -     Hemoglobin A1c  Visit for screening mammogram -     MM 3D SCREEN BREAST BILATERAL  No energy -     VITAMIN D 25 Hydroxy (Vit-D Deficiency, Fractures) -     B12 -     TSH -     CBC with Differential/Platelet -     Ferritin  Vitamin D insufficiency -     VITAMIN D 25 Hydroxy (Vit-D Deficiency, Fractures)  Chronic fatigue syndrome  Acquired hypothyroidism  Rheumatoid arthritis 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 (HCC) -     meloxicam (MOBIC) 15 MG tablet; TAKE 1 Tablet BY MOUTH ONCE DAILY **REPLACES NAPROXEN**  Bipolar 1 disorder, depressed, severe (HCC)  Borderline personality disorder ([place])  Other orders -     Discontinue: montelukast (SINGULAIR) 10 MG tablet; Take 1 tablet (10 mg total) by mouth at bedtime. -     montelukast (SINGULAIR) 10 MG tablet; Take 1 tablet (10 mg total) by mouth at bedtime.   .. Depression screen [facility] 2/9 05/11/2019 04/08/2018 11/03/2017 09/16/2017  Decreased Interest 3 3 2 3   Down, Depressed, Hopeless 2 2 1 3   PHQ - 2 Score 5 5 3 6   Altered sleeping 1 2 2 3   Tired, decreased energy 3 3 3 2   Change in appetite 0 0 1 2  Feeling bad or failure about yourself  1 1 2 3   Trouble concentrating 1 1 1 2   Moving slowly or fidgety/restless 0 0 0 1  Suicidal thoughts 0 1 1 3   PHQ-9 Score 11 13 13 22   Difficult doing work/chores Very difficult Very difficult Somewhat difficult -    .[name] Discussed 150 minutes of exercise a week.  Encouraged vitamin D 1000 units and Calcium 1300mg  or 4 servings of dairy a day.  Pt declines STD testings.  Pap up to date.  Mammogram ordered.  Fasting labs ordered.   Ordered some labs to recheck metabolic reasons for fatigue. Pt has been dx with chronic fatigue syndrome.      See After Visit Summary for Counseling Recommendations

## 2019-05-11 NOTE — Patient Instructions (Signed)

## 2019-05-26 ENCOUNTER — Other Ambulatory Visit: Payer: Self-pay

## 2019-05-26 ENCOUNTER — Ambulatory Visit (INDEPENDENT_AMBULATORY_CARE_PROVIDER_SITE_OTHER): Payer: Self-pay

## 2019-05-26 DIAGNOSIS — Z1231 Encounter for screening mammogram for malignant neoplasm of breast: Secondary | ICD-10-CM

## 2019-05-26 MED FILL — HYDROXYCHLOROQUINE 200 MG TABLET: ORAL | 30 days supply | Qty: 60 | Fill #2

## 2019-05-26 MED FILL — HYDROXYCHLOROQUINE 200 MG TABLET: 30 days supply | Qty: 60 | Fill #2 | Status: AC

## 2019-05-27 NOTE — Progress Notes (Signed)
Call pt: and let you know normal mammogram. Follow up in 1year.

## 2019-06-01 IMAGING — MR MR HEAD W/O CM
10 of 11 series · 42 of 48 positions shown · non-contrast
Comparison: Cervical spine MRI 08/01/2017.

CLINICAL DATA: 41-year-old female with worsening balance disorder
for 6 months. Multiple prior head trauma most recently August 2017. possible mild vermian atrophy seen on cervical spine MRI
08/01/2017.

EXAM:
MRI HEAD WITHOUT CONTRAST
TECHNIQUE: Multiplanar, multiecho pulse sequences of the brain and surrounding
structures were obtained without intravenous contrast.

[Series 2: T1 · sagittal · 5.0mm · 0.45mm/px · 2 of 23 slices shown]
[im 1/23]
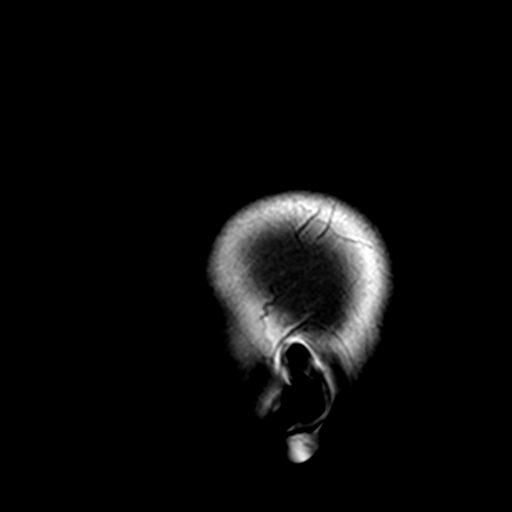
[im 23/23]
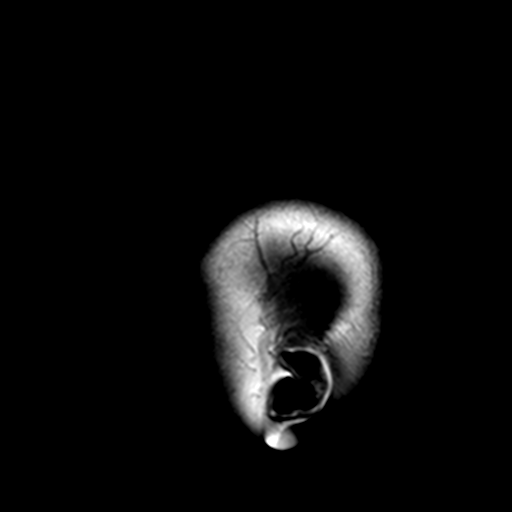

[Series 3: DWI · axial · 3.0mm · 2.19mm/px · z∈[-57,+99]mm · 9 of 96 slices shown (1 of 4)]
[im 1/96]
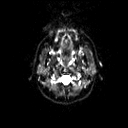
[im 12/96]
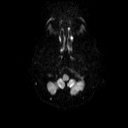
[im 24/96]
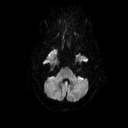
[im 36/96]
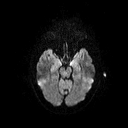
[im 48/96]
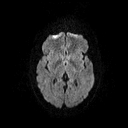
[im 60/96]
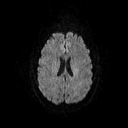
[im 72/96]
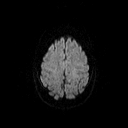
[im 84/96]
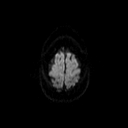
[im 96/96]
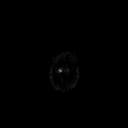

[Series 4: DWI · axial · 3.0mm · 2.19mm/px · z∈[-57,+99]mm · 5 of 48 slices shown (2 of 4)]
[im 1/48]
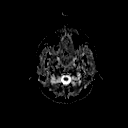
[im 12/48]
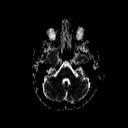
[im 24/48]
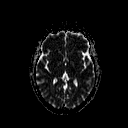
[im 36/48]
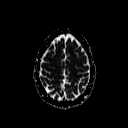
[im 48/48]
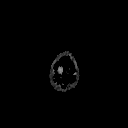

[Series 5: DWI · coronal · 3.0mm · 1.46mm/px · 10 of 98 slices shown (3 of 4)]
[im 1/98]
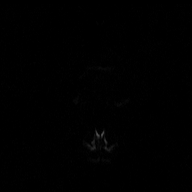
[im 11/98]
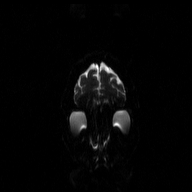
[im 22/98]
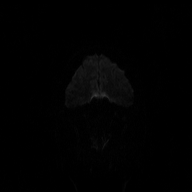
[im 33/98]
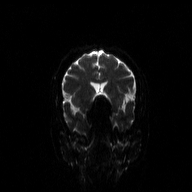
[im 44/98]
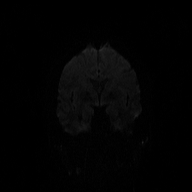
[im 54/98]
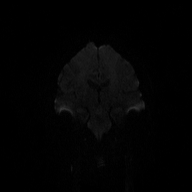
[im 65/98]
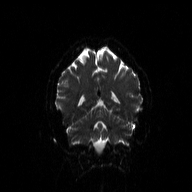
[im 76/98]
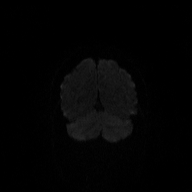
[im 87/98]
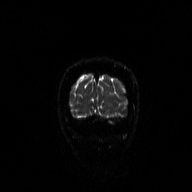
[im 98/98]
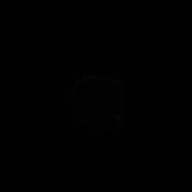

[Series 6: DWI · coronal · 3.0mm · 1.46mm/px · 5 of 49 slices shown (4 of 4)]
[im 1/49]
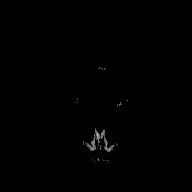
[im 13/49]
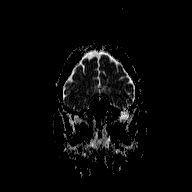
[im 25/49]
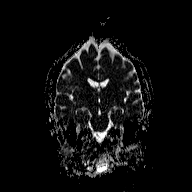
[im 37/49]
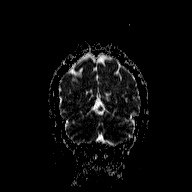
[im 49/49]
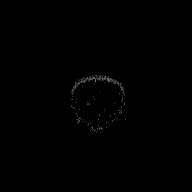

[Series 7: FLAIR · axial · 3.0mm · 0.45mm/px · z∈[-56,+100]mm · 3 of 27 slices shown]
[im 1/27]
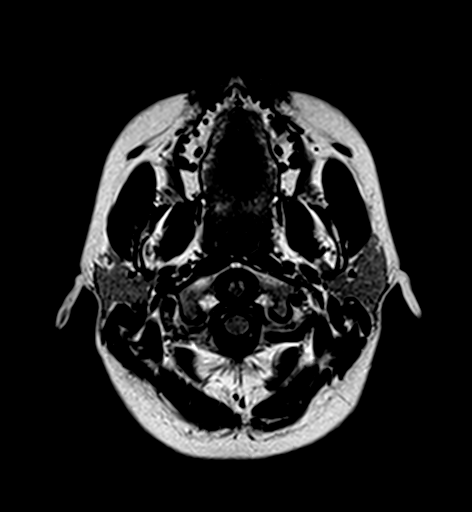
[im 14/27]
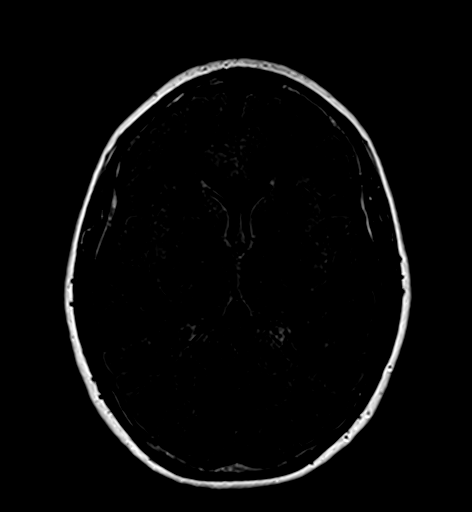
[im 27/27]
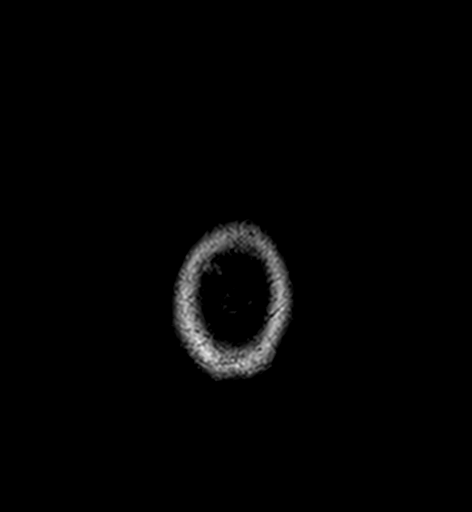

[Series 8: T2 · axial · 5.0mm · 0.45mm/px · z∈[-58,+103]mm · 2 of 24 slices shown (1 of 2)]
[im 1/24]
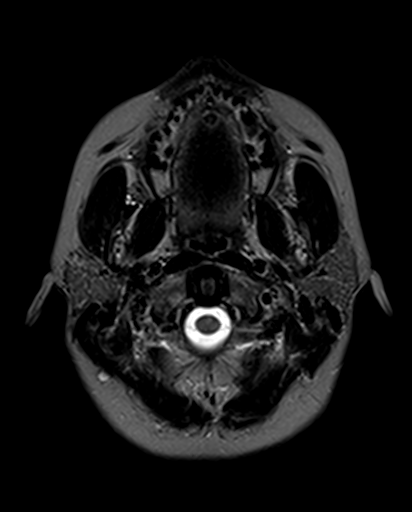
[im 24/24]
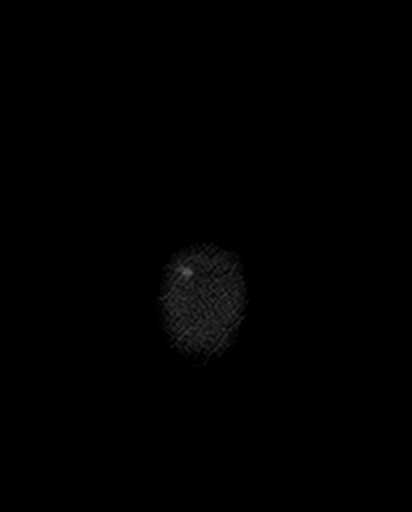

[Series 9: T2 · axial · 5.0mm · 0.45mm/px · z∈[-58,+103]mm · 2 of 24 slices shown (2 of 2)]
[im 1/24]
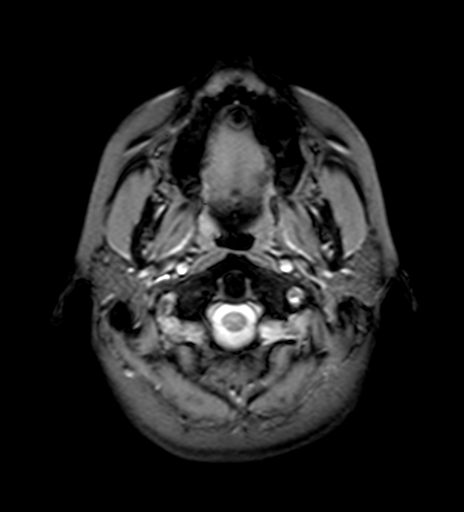
[im 24/24]
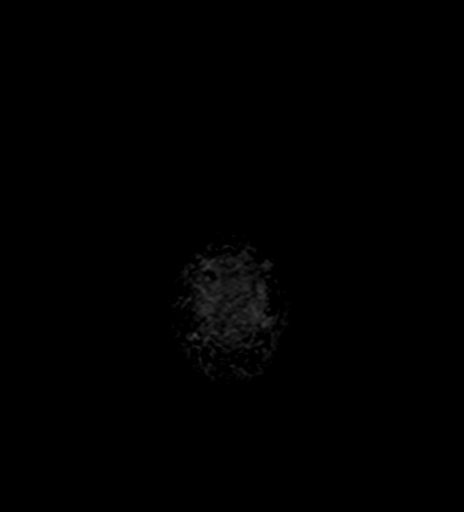

[Series 11: T2 post-contrast · coronal · 5.0mm · 0.45mm/px · 3 of 28 slices shown]
[im 1/28]
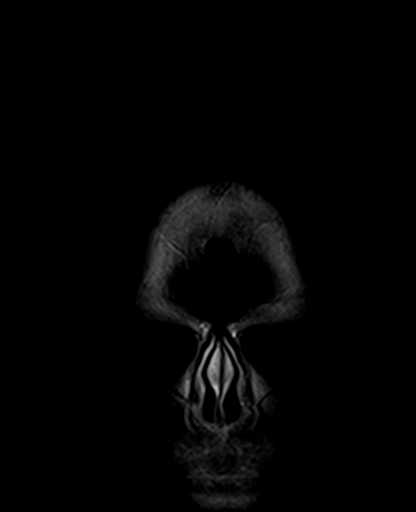
[im 14/28]
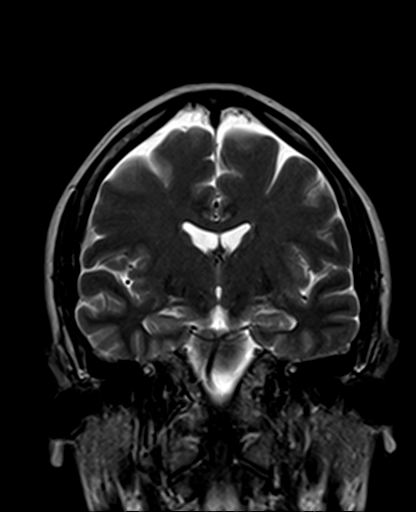
[im 28/28]
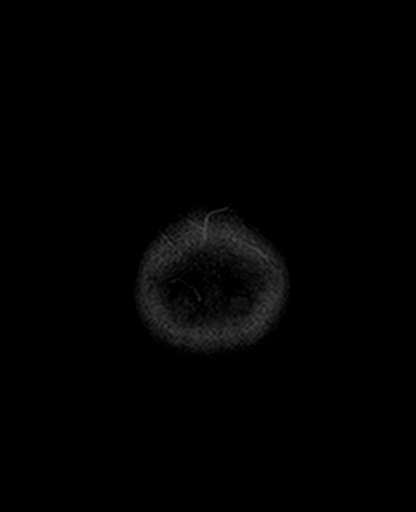

[Series 100: hx · axial · 10.0mm · 0.55mm/px · 1 of 9 slices shown]
[im 1/9]
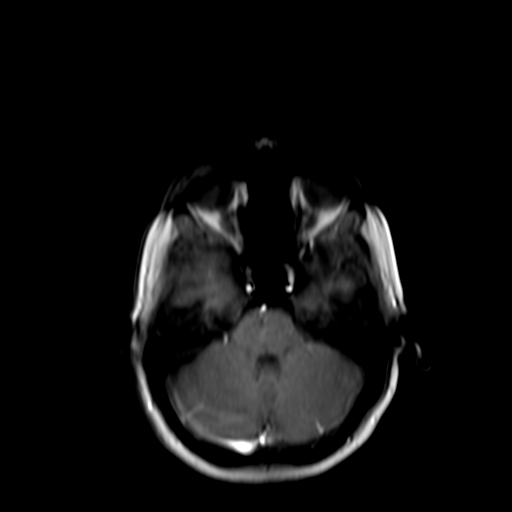

[42 of 48 positions shown; findings below may reference images not displayed]

FINDINGS: Brain: Overall brain volume is normal. The cerebellar vermis volume
appears (maintained series 8, image 6), and bilateral cerebellar
hemisphere volume appears normal. Normal signal in the brainstem and
cerebellum.

Normal for age gray and white matter signal elsewhere throughout the
brain.

No encephalomalacia or chronic cerebral blood products.

No restricted diffusion to suggest acute infarction. No midline
shift, mass effect, evidence of mass lesion, ventriculomegaly,
extra-axial collection or acute intracranial hemorrhage.
Cervicomedullary junction and pituitary are within normal limits.

Vascular: Major intracranial vascular flow voids are preserved. The
left vertebral artery is dominant as seen on the comparison cervical
spine.

Skull and upper cervical spine: Negative visualized cervical spine.
Normal bone marrow signal.

Sinuses/Orbits: Normal orbits soft tissues. Paranasal sinuses are
clear.

Other: Visible internal auditory structures appear normal. Mastoid
air cells are clear. Scalp and face soft tissues appear negative.
IMPRESSION: Cerebellar volume appears normal. Normal noncontrast MRI appearance
of the brain.

## 2019-06-08 ENCOUNTER — Institutional Professional Consult (permissible substitution): Admit: 2019-06-08 | Discharge: 2019-06-09

## 2019-06-08 MED ORDER — DULOXETINE 30 MG CAPSULE,DELAYED RELEASE
ORAL_CAPSULE | Freq: Every day | ORAL | 2 refills | 30.00000 days | Status: CP
Start: 2019-06-08 — End: 2019-07-15
  Filled 2019-06-10: qty 30, 30d supply, fill #0

## 2019-06-10 MED FILL — DULOXETINE 30 MG CAPSULE,DELAYED RELEASE: 30 days supply | Qty: 30 | Fill #0 | Status: AC

## 2019-06-27 MED ORDER — HYDROXYCHLOROQUINE 200 MG TABLET
ORAL_TABLET | ORAL | 4 refills | 0 days | Status: CP
Start: 2019-06-27 — End: 2020-06-26
  Filled 2019-06-29: qty 60, 30d supply, fill #0

## 2019-06-29 MED FILL — HYDROXYCHLOROQUINE 200 MG TABLET: 30 days supply | Qty: 60 | Fill #0 | Status: AC

## 2019-07-06 MED FILL — DULOXETINE 30 MG CAPSULE,DELAYED RELEASE: 30 days supply | Qty: 30 | Fill #1 | Status: AC

## 2019-07-06 MED FILL — DULOXETINE 30 MG CAPSULE,DELAYED RELEASE: ORAL | 30 days supply | Qty: 30 | Fill #1

## 2019-07-12 ENCOUNTER — Other Ambulatory Visit: Payer: Self-pay | Admitting: Physician Assistant

## 2019-07-15 ENCOUNTER — Ambulatory Visit: Admit: 2019-07-15 | Discharge: 2019-07-16

## 2019-07-15 DIAGNOSIS — Z13220 Encounter for screening for lipoid disorders: Secondary | ICD-10-CM

## 2019-07-15 DIAGNOSIS — M797 Fibromyalgia: Secondary | ICD-10-CM

## 2019-07-15 DIAGNOSIS — M1990 Unspecified osteoarthritis, unspecified site: Principal | ICD-10-CM

## 2019-07-15 DIAGNOSIS — R5383 Other fatigue: Secondary | ICD-10-CM

## 2019-07-15 MED ORDER — DULOXETINE 30 MG CAPSULE,DELAYED RELEASE
ORAL_CAPSULE | Freq: Every day | ORAL | 5 refills | 30.00000 days | Status: CP
Start: 2019-07-15 — End: 2020-07-14
  Filled 2019-07-25: qty 60, 30d supply, fill #0

## 2019-07-20 ENCOUNTER — Ambulatory Visit: Admit: 2019-07-20 | Discharge: 2019-07-20

## 2019-07-20 ENCOUNTER — Ambulatory Visit: Admit: 2019-07-20 | Discharge: 2019-07-20 | Attending: Family Medicine | Primary: Family Medicine

## 2019-07-20 DIAGNOSIS — R5383 Other fatigue: Secondary | ICD-10-CM

## 2019-07-20 DIAGNOSIS — M1611 Unilateral primary osteoarthritis, right hip: Principal | ICD-10-CM

## 2019-07-20 DIAGNOSIS — M1990 Unspecified osteoarthritis, unspecified site: Secondary | ICD-10-CM

## 2019-07-20 DIAGNOSIS — Z13220 Encounter for screening for lipoid disorders: Principal | ICD-10-CM

## 2019-07-22 ENCOUNTER — Encounter: Payer: Self-pay | Admitting: Physician Assistant

## 2019-07-22 NOTE — Telephone Encounter (Signed)
I do not see labs in our system or care everywhere can we can rheum office and let patient know we are waiting on them to send [place] labs.

## 2019-07-22 NOTE — Telephone Encounter (Signed)
Labs printed from Care Everywhere.

## 2019-07-25 MED FILL — HYDROXYCHLOROQUINE 200 MG TABLET: 30 days supply | Qty: 60 | Fill #1 | Status: AC

## 2019-07-25 MED FILL — HYDROXYCHLOROQUINE 200 MG TABLET: ORAL | 30 days supply | Qty: 60 | Fill #1

## 2019-07-25 MED FILL — DULOXETINE 30 MG CAPSULE,DELAYED RELEASE: 30 days supply | Qty: 60 | Fill #0 | Status: AC

## 2019-08-12 ENCOUNTER — Ambulatory Visit: Admit: 2019-08-12 | Discharge: 2019-08-13

## 2019-08-12 ENCOUNTER — Ambulatory Visit: Admit: 2019-08-12 | Discharge: 2019-08-13 | Attending: Family Medicine | Primary: Family Medicine

## 2019-08-12 DIAGNOSIS — Z131 Encounter for screening for diabetes mellitus: Principal | ICD-10-CM

## 2019-08-12 DIAGNOSIS — M1611 Unilateral primary osteoarthritis, right hip: Principal | ICD-10-CM

## 2019-08-25 MED FILL — HYDROXYCHLOROQUINE 200 MG TABLET: ORAL | 30 days supply | Qty: 60 | Fill #2

## 2019-08-25 MED FILL — DULOXETINE 30 MG CAPSULE,DELAYED RELEASE: 30 days supply | Qty: 60 | Fill #1 | Status: AC

## 2019-08-25 MED FILL — DULOXETINE 30 MG CAPSULE,DELAYED RELEASE: ORAL | 30 days supply | Qty: 60 | Fill #1

## 2019-08-26 DIAGNOSIS — M0600 Rheumatoid arthritis without rheumatoid factor, unspecified site: Secondary | ICD-10-CM

## 2019-09-06 ENCOUNTER — Encounter: Payer: Self-pay | Admitting: Physician Assistant

## 2019-09-07 ENCOUNTER — Encounter: Payer: Self-pay | Admitting: Physician Assistant

## 2019-09-09 ENCOUNTER — Encounter: Payer: Self-pay | Admitting: Physician Assistant

## 2019-09-09 ENCOUNTER — Other Ambulatory Visit: Payer: Self-pay

## 2019-09-09 ENCOUNTER — Ambulatory Visit (INDEPENDENT_AMBULATORY_CARE_PROVIDER_SITE_OTHER): Payer: Self-pay | Admitting: Physician Assistant

## 2019-09-09 VITALS — BP 116/77 | HR 97 | Ht 67.0 in | Wt 173.0 lb

## 2019-09-09 DIAGNOSIS — Z79899 Other long term (current) drug therapy: Secondary | ICD-10-CM

## 2019-09-09 DIAGNOSIS — Z23 Encounter for immunization: Secondary | ICD-10-CM

## 2019-09-09 DIAGNOSIS — B351 Tinea unguium: Secondary | ICD-10-CM

## 2019-09-09 DIAGNOSIS — R55 Syncope and collapse: Secondary | ICD-10-CM | POA: Insufficient documentation

## 2019-09-09 MED ORDER — TERBINAFINE HCL 250 MG PO TABS: 250.0000 mg | ORAL_TABLET | Freq: Every day | ORAL | 0 refills | Status: DC

## 2019-09-09 NOTE — Patient Instructions (Signed)
Fingernail or Toenail Removal, Adult, Care After This sheet gives you information about how to care for yourself after your procedure. Your health care provider may also give you more specific instructions. If you have problems or questions, contact your health care provider. What can I expect after the procedure? After the procedure, it is common to have:  Pain.  Redness.  Swelling.  Soreness. Follow these instructions at home:  If you have a splint: ? Do not put pressure on any part of the splint until it is fully hardened. This may take several hours. ? Wear the splint as told by your health care provider. Remove it only as told by your health care provider. ? Loosen the splint if your fingers or toes tingle, become numb, or turn cold and blue. ? Keep the splint clean. ? If the splint is not waterproof:  Do not let it get wet.  Cover it with a watertight covering when you take a bath or a shower. Wound care   Follow instructions from your health care provider about how to take care of your wound. Make sure you: ? Wash your hands with soap and water before you change your bandage (dressing). If soap and water are not available, use hand sanitizer. ? Change your dressing as told by your health care provider. ? Keep your dressing dry until your health care provider says it can be removed. ? Leave stitches (sutures), skin glue, or adhesive strips in place. These skin closures may need to stay in place for 2 weeks or longer. If adhesive strip edges start to loosen and curl up, you may trim the loose edges. Do not remove adhesive strips completely unless your health care provider tells you to do that.  Check your wound every day for signs of infection. Check for: ? More redness, swelling, or pain. ? More fluid or blood. ? Warmth. ? Pus or a bad smell. Managing pain, stiffness, and swelling  Move your fingers or toes often to avoid stiffness and to lessen swelling.  Raise  (elevate) the injured area above the level of your heart while you are sitting or lying down. You may need to keep your finger or toe raised or supported on a pillow for 24 hours or as told by your health care provider.  Soak your hand or foot in warm, soapy water for 10-20 minutes, 3 times a day or as told by your health care provider. Medicine  Take over-the-counter and prescription medicines only as told by your health care provider.  If you were prescribed an antibiotic medicine, use it as told by your health care provider. Do not stop using the antibiotic even if your condition improves. General instructions  If you were given a shoe to wear, wear it as told by your health care provider.  Keep all follow-up visits as told by your health care provider. This is important. Contact a health care provider if:  You have more redness, swelling, or pain around your wound.  You have more fluid or blood coming from your wound.  Your wound feels warm to the touch.  You have pus or a bad smell coming from your wound.  You have a fever.  Your finger or toe looks blue or black. This information is not intended to replace advice given to you by your health care provider. Make sure you discuss any questions you have with your health care provider. Document Released: 12/29/2014 Document Revised: 11/20/2017 Document Reviewed: 06/16/2016 Elsevier Patient   Education  2020 [employer]  

## 2019-09-09 NOTE — Progress Notes (Addendum)
Subjective:    Patient ID: Frances [name], female    DOB: [birth year], 43 y.o.   MRN: [id number]  HPI Pt is a 42 yo female who presents to the clinic with left toenail that gets brittle and falls off every year. No pain or itching. Does not remember any trauma. She has other toenails that are yellow and becoming thicker.   .. Active Ambulatory Problems    Diagnosis Date Noted  . Hypothyroidism 02/10/2008  . 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 DERMATITIS, ALLERGIC 06/05/2008  . 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 FIBROMYALGIA 02/10/2008  . FATIGUE 05/05/2008  . IRON, SERUM, ELEVATED 02/10/2008  . DYSPNEA 10/15/2011  . Bipolar I disorder ([place]) 09/16/2017  . Fibromyalgia 09/16/2017  . Chronic fatigue syndrome 09/16/2017  . MTHFR gene mutation ([place]) 09/20/2017  . TMJ (temporomandibular joint syndrome) 09/26/2017  . New daily persistent headache 09/26/2017  . Rheumatoid arthritis 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 ([place]) 12/09/2017  . Anxiety 01/22/2018  . Borderline personality disorder ([place]) 01/22/2018  . Depression   . Bipolar 1 disorder, depressed, severe ([place]) 01/25/2018  . Elevated fasting glucose 04/08/2018  . Decreased visual acuity 08/30/2018  . Right corneal abrasion 08/30/2018   Resolved Ambulatory Problems    Diagnosis Date Noted  . FEVER, RECURRENT 04/02/2009  . SINUSITIS- ACUTE-NOS 03/24/2008  . URI 01/17/2009  . ANKLE PAIN, BILATERAL 07/28/2008  . BACK PAIN 10/13/2008  . OPEN WOUND FT NO TOE ALONE WITHOUT MENTION COMP 03/28/2009   Past Medical History:  Diagnosis Date  . Arthritis   . Back pain, chronic   . Bipolar 1 disorder ([place])   . DDD (degenerative disc disease), lumbar   . IUD 2012      Review of Systems See HPI.     Objective:   Physical Exam Vitals signs reviewed.  Constitutional:      Appearance: Normal appearance.  HENT:     Head: Normocephalic.  Cardiovascular:     Rate and Rhythm: Normal  rate and regular rhythm.     Pulses: Normal pulses.  Pulmonary:     Effort: Pulmonary effort is normal.  Skin:    Comments: Left great toenail broken off and brittle with scaly skin over nail bed. No erythema or tenderness to palpation.   Neurological:     General: No focal deficit present.     Mental Status: She is alert and oriented to person, place, and time.  Psychiatric:        Mood and Affect: Mood normal.           Assessment & Plan:  Marland KitchenMarland KitchenAalyiah was seen today for nail problem.  Diagnoses and all orders for this visit:  Toenail fungus -     terbinafine (LAMISIL) 250 MG tablet; Take 1 tablet (250 mg total) by mouth daily.  Flu vaccine need -     Flu Vaccine QUAD 36+ mos IM  Medication management -     COMPLETE METABOLIC PANEL WITH GFR   Toenail Avulsion Procedure Note  Pre-operative Diagnosis: Left broken off toenail with fungus  Post-operative Diagnosis: Left broken off toenail with fungus  Indications: irritation/broken nail bed  Anesthesia: Lidocaine 1% without epinephrine without added sodium bicarbonate  Procedure Details  History of allergy to iodine: no  The risks (including bleeding and infection) and benefits of the  procedure and Verbal informed consent obtained.  After digital block anesthesia was obtained, a tourniquet was applied for hemostasis during the procedure.  After  prepping with Betadine, the offending edge of the nail was freed from the nailbed and perionychium, and then split with scissors and removed with  forceps.  All visible granulation tissue is debrided. Antibiotic and bulky dressing was applied.   Findings: fungual broken off toenail  Complications: vasovagal episode.  Plan: 1. Soak the foot twice daily. Change dressing twice daily until healed over. 2. Warning signs of infection were reviewed.   3. Recommended that the patient use OTC acetaminophen as needed for pain.  4. Return in 2 weeks.  Started lamisil for  fungus for next 3 months. After 1 month recheck liver enzymes. cmp printed.   Pt given sprite/crackers/zofran and laid down for 20 minutes. She recovered nicely and felt fine at discharge.

## 2019-09-23 MED FILL — DULOXETINE 30 MG CAPSULE,DELAYED RELEASE: ORAL | 30 days supply | Qty: 60 | Fill #2

## 2019-09-23 MED FILL — HYDROXYCHLOROQUINE 200 MG TABLET: 30 days supply | Qty: 60 | Fill #3 | Status: AC

## 2019-09-23 MED FILL — HYDROXYCHLOROQUINE 200 MG TABLET: ORAL | 30 days supply | Qty: 60 | Fill #3

## 2019-09-23 MED FILL — DULOXETINE 30 MG CAPSULE,DELAYED RELEASE: 30 days supply | Qty: 60 | Fill #2 | Status: AC

## 2019-10-13 ENCOUNTER — Ambulatory Visit: Admit: 2019-10-13 | Discharge: 2019-10-13

## 2019-10-13 DIAGNOSIS — G8929 Other chronic pain: Principal | ICD-10-CM

## 2019-10-13 DIAGNOSIS — M25551 Pain in right hip: Principal | ICD-10-CM

## 2019-10-14 MED ORDER — DICLOFENAC SODIUM 50 MG TABLET,DELAYED RELEASE: 50 mg | tablet | Freq: Two times a day (BID) | 11 refills | 30 days | Status: AC

## 2019-10-14 MED FILL — DICLOFENAC SODIUM 50 MG TABLET,DELAYED RELEASE: ORAL | 30 days supply | Qty: 60 | Fill #0

## 2019-10-14 MED FILL — DICLOFENAC SODIUM 50 MG TABLET,DELAYED RELEASE: 30 days supply | Qty: 60 | Fill #0 | Status: AC

## 2019-10-20 ENCOUNTER — Telehealth: Admit: 2019-10-20 | Discharge: 2019-10-21 | Attending: Sports Medicine | Primary: Sports Medicine

## 2019-10-20 DIAGNOSIS — M1611 Unilateral primary osteoarthritis, right hip: Principal | ICD-10-CM

## 2019-10-25 MED FILL — DULOXETINE 30 MG CAPSULE,DELAYED RELEASE: 30 days supply | Qty: 60 | Fill #3 | Status: AC

## 2019-10-25 MED FILL — DULOXETINE 30 MG CAPSULE,DELAYED RELEASE: ORAL | 30 days supply | Qty: 60 | Fill #3

## 2019-10-25 MED FILL — HYDROXYCHLOROQUINE 200 MG TABLET: ORAL | 30 days supply | Qty: 60 | Fill #4

## 2019-10-25 MED FILL — HYDROXYCHLOROQUINE 200 MG TABLET: 30 days supply | Qty: 60 | Fill #4 | Status: AC

## 2019-11-02 ENCOUNTER — Encounter: Payer: Self-pay | Admitting: Physician Assistant

## 2019-11-10 MED FILL — DICLOFENAC SODIUM 50 MG TABLET,DELAYED RELEASE: 30 days supply | Qty: 60 | Fill #1 | Status: AC

## 2019-11-10 MED FILL — DICLOFENAC SODIUM 50 MG TABLET,DELAYED RELEASE: ORAL | 30 days supply | Qty: 60 | Fill #1

## 2019-11-22 DIAGNOSIS — M1990 Unspecified osteoarthritis, unspecified site: Principal | ICD-10-CM

## 2019-11-22 MED ORDER — HYDROXYCHLOROQUINE 200 MG TABLET
ORAL_TABLET | ORAL | 4 refills | 0 days | Status: CP
Start: 2019-11-22 — End: 2020-11-21
  Filled 2019-11-24: qty 60, 30d supply, fill #0

## 2019-11-24 ENCOUNTER — Encounter: Payer: Self-pay | Admitting: Physician Assistant

## 2019-11-24 MED FILL — DULOXETINE 30 MG CAPSULE,DELAYED RELEASE: 30 days supply | Qty: 60 | Fill #4 | Status: AC

## 2019-11-24 MED FILL — HYDROXYCHLOROQUINE 200 MG TABLET: 30 days supply | Qty: 60 | Fill #0 | Status: AC

## 2019-11-24 MED FILL — DULOXETINE 30 MG CAPSULE,DELAYED RELEASE: ORAL | 30 days supply | Qty: 60 | Fill #4

## 2019-11-28 ENCOUNTER — Encounter: Payer: Self-pay | Admitting: Physician Assistant

## 2019-11-28 ENCOUNTER — Ambulatory Visit (INDEPENDENT_AMBULATORY_CARE_PROVIDER_SITE_OTHER): Payer: Self-pay | Admitting: Physician Assistant

## 2019-11-28 ENCOUNTER — Other Ambulatory Visit: Payer: Self-pay

## 2019-11-28 VITALS — BP 111/87 | HR 98 | Ht 67.0 in | Wt 180.0 lb

## 2019-11-28 DIAGNOSIS — Z79899 Other long term (current) drug therapy: Secondary | ICD-10-CM

## 2019-11-28 DIAGNOSIS — F4320 Adjustment disorder, unspecified: Secondary | ICD-10-CM | POA: Insufficient documentation

## 2019-11-28 DIAGNOSIS — F4321 Adjustment disorder with depressed mood: Secondary | ICD-10-CM

## 2019-11-28 DIAGNOSIS — B351 Tinea unguium: Secondary | ICD-10-CM

## 2019-11-28 MED ORDER — TERBINAFINE HCL 250 MG PO TABS: 250.0000 mg | ORAL_TABLET | Freq: Every day | ORAL | 0 refills | Status: AC

## 2019-11-28 NOTE — Patient Instructions (Addendum)
Preventing Toenail Fungus from Recurring   Sanitize your shoes with Mycomist spray or a similar shoe sanitizer spray.  Follow the instructions on the bottle and dry them outside in the sun or with a hairdryer.  We also recommend repeating the sanitization once weekly in shoes you wear most often.   Throw away any shoes you have worn a significant amount without socks-fungus thrives in a warm moist environment and you want to avoid re-infection after your laser procedure   Bleach your socks with regular or color safe bleach   Change your socks regularly to keep your feet clean and dry (especially if you have sweaty feet)-if sweaty feet are a problem, let your doctor know-there is a great lotion that helps with this problem.   Clean your toenail clippers with alcohol before you use them if you do your own toenails and make sure to replace [employer] boards and orange sticks regularly   If you get regular pedicures, bring your own instruments or go to a spa that sterilizes their instruments in an autoclave. 

## 2019-11-28 NOTE — Progress Notes (Signed)
Subjective:    Patient ID: Frances [name], female    DOB: [birth year], 43 y.o.   MRN: [id number]  HPI  Pt is a 43 year old female with bipolar 1, fibromyalgia, toenail fungus who presents to the clinic to follow-up 3 months after starting Lamisil.  She does feel like her bilateral great toes are doing somewhat better.  This is been a slow process.  She is concerned about her right great toe.  She has tolerated Lamisil fine.  She does mention her mother passing away yesterday from Covid and complications.  She denies any suicidal or homicidal thoughts.  Overall she feels down but feels like she is tolerating it well.  She agrees to reach out to her counselor this week.  She has symptoms already scheduled for next week.  .. Active Ambulatory Problems    Diagnosis Date Noted  . Hypothyroidism 02/10/2008  . 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 DERMATITIS, ALLERGIC 06/05/2008  . 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 FIBROMYALGIA 02/10/2008  . FATIGUE 05/05/2008  . IRON, SERUM, ELEVATED 02/10/2008  . DYSPNEA 10/15/2011  . Bipolar I disorder ([place]) 09/16/2017  . Fibromyalgia 09/16/2017  . Chronic fatigue syndrome 09/16/2017  . MTHFR gene mutation ([place]) 09/20/2017  . TMJ (temporomandibular joint syndrome) 09/26/2017  . New daily persistent headache 09/26/2017  . Rheumatoid arthritis 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 ([place]) 12/09/2017  . Anxiety 01/22/2018  . Borderline personality disorder ([place]) 01/22/2018  . Depression   . Bipolar 1 disorder, depressed, severe ([place]) 01/25/2018  . Elevated fasting glucose 04/08/2018  . Decreased visual acuity 08/30/2018  . Right corneal abrasion 08/30/2018  . Toenail fungus 09/09/2019  . Vasovagal response 09/09/2019  . Grief reaction 11/28/2019   Resolved Ambulatory Problems    Diagnosis Date Noted  . FEVER, RECURRENT 04/02/2009  . SINUSITIS- ACUTE-NOS 03/24/2008  . URI 01/17/2009  . ANKLE PAIN, BILATERAL  07/28/2008  . BACK PAIN 10/13/2008  . OPEN WOUND FT NO TOE ALONE WITHOUT MENTION COMP 03/28/2009   Past Medical History:  Diagnosis Date  . Arthritis   . Back pain, chronic   . Bipolar 1 disorder ([place])   . DDD (degenerative disc disease), lumbar   . IUD 2012      Review of Systems  All other systems reviewed and are negative.      Objective:   Physical Exam Vitals signs reviewed.  Constitutional:      Appearance: Normal appearance.  Cardiovascular:     Rate and Rhythm: Normal rate.  Pulmonary:     Effort: Pulmonary effort is normal.  Skin:    Comments: Bilateral great toenail appears normal at the cuticle base. End of toe is thicker,frail, yellow appearance of right. Left end appears dark from cauterization.   Neurological:     General: No focal deficit present.     Mental Status: She is alert and oriented to person, place, and time.  Psychiatric:        Mood and Affect: Mood normal.           Assessment & Plan:  Marland KitchenMarland KitchenBrentlee was seen today for nail problem.  Diagnoses and all orders for this visit:  Toenail fungus -     terbinafine (LAMISIL) 250 MG tablet; Take 1 tablet (250 mg total) by mouth daily.  Medication management -     COMPLETE METABOLIC PANEL WITH GFR  Grief reaction   Overall think her toes are making good progress.  It does seem to be growing  out well at the base.  The ends still have fungal elements.  Discussed this is normal variant.  As they grow out should be able to clip that off.  Continue Lamisil for another 3 months.  She had her kidney and liver checked while she was on Lamisil.  She had it done at outside lab.  Function was normal.  Discussed death of her mother.  She seems to be handling it pretty well.  Encouraged her to call her counselor and see if they can move up appointment to this week.  Call office if mood worsening.

## 2019-11-30 ENCOUNTER — Ambulatory Visit: Admit: 2019-11-30 | Discharge: 2019-12-01 | Attending: Sports Medicine | Primary: Sports Medicine

## 2019-12-12 MED FILL — DICLOFENAC SODIUM 50 MG TABLET,DELAYED RELEASE: 30 days supply | Qty: 60 | Fill #2 | Status: AC

## 2019-12-12 MED FILL — DICLOFENAC SODIUM 50 MG TABLET,DELAYED RELEASE: ORAL | 30 days supply | Qty: 60 | Fill #2

## 2019-12-19 ENCOUNTER — Ambulatory Visit: Admit: 2019-12-19 | Discharge: 2019-12-19

## 2019-12-19 DIAGNOSIS — M0600 Rheumatoid arthritis without rheumatoid factor, unspecified site: Principal | ICD-10-CM

## 2019-12-19 MED ORDER — XELJANZ 5 MG TABLET
ORAL_TABLET | Freq: Two times a day (BID) | ORAL | 4 refills | 30 days | Status: CP
Start: 2019-12-19 — End: ?

## 2019-12-20 DIAGNOSIS — M0600 Rheumatoid arthritis without rheumatoid factor, unspecified site: Principal | ICD-10-CM

## 2019-12-20 DIAGNOSIS — M1990 Unspecified osteoarthritis, unspecified site: Principal | ICD-10-CM

## 2019-12-20 MED ORDER — PREDNISONE 5 MG TABLET
ORAL_TABLET | 0 refills | 0 days | Status: CP
Start: 2019-12-20 — End: ?
  Filled 2019-12-26: qty 53, 28d supply, fill #0

## 2019-12-21 DIAGNOSIS — R945 Abnormal results of liver function studies: Principal | ICD-10-CM

## 2019-12-26 MED FILL — DULOXETINE 30 MG CAPSULE,DELAYED RELEASE: ORAL | 30 days supply | Qty: 60 | Fill #5

## 2019-12-26 MED FILL — HYDROXYCHLOROQUINE 200 MG TABLET: 30 days supply | Qty: 60 | Fill #1 | Status: AC

## 2019-12-26 MED FILL — PREDNISONE 5 MG TABLET: 28 days supply | Qty: 53 | Fill #0 | Status: AC

## 2019-12-26 MED FILL — HYDROXYCHLOROQUINE 200 MG TABLET: ORAL | 30 days supply | Qty: 60 | Fill #1

## 2019-12-26 MED FILL — DULOXETINE 30 MG CAPSULE,DELAYED RELEASE: 30 days supply | Qty: 60 | Fill #5 | Status: AC

## 2019-12-29 ENCOUNTER — Telehealth: Admit: 2019-12-29 | Discharge: 2019-12-30 | Attending: Sports Medicine | Primary: Sports Medicine

## 2020-01-04 ENCOUNTER — Ambulatory Visit: Admit: 2020-01-04 | Discharge: 2020-01-05 | Attending: Sports Medicine | Primary: Sports Medicine

## 2020-01-10 ENCOUNTER — Other Ambulatory Visit: Payer: Self-pay | Admitting: Physician Assistant

## 2020-01-11 ENCOUNTER — Ambulatory Visit: Admit: 2020-01-11 | Discharge: 2020-01-12 | Attending: Sports Medicine | Primary: Sports Medicine

## 2020-01-11 ENCOUNTER — Ambulatory Visit (INDEPENDENT_AMBULATORY_CARE_PROVIDER_SITE_OTHER): Payer: Self-pay

## 2020-01-11 ENCOUNTER — Ambulatory Visit (INDEPENDENT_AMBULATORY_CARE_PROVIDER_SITE_OTHER): Payer: Self-pay | Admitting: Physician Assistant

## 2020-01-11 ENCOUNTER — Encounter: Payer: Self-pay | Admitting: Physician Assistant

## 2020-01-11 ENCOUNTER — Other Ambulatory Visit: Payer: Self-pay

## 2020-01-11 VITALS — BP 120/86 | HR 118 | Ht 67.0 in | Wt 181.0 lb

## 2020-01-11 DIAGNOSIS — S73191A Other sprain of right hip, initial encounter: Secondary | ICD-10-CM | POA: Insufficient documentation

## 2020-01-11 DIAGNOSIS — M5137 Other intervertebral disc degeneration, lumbosacral region: Secondary | ICD-10-CM

## 2020-01-11 DIAGNOSIS — M25551 Pain in right hip: Secondary | ICD-10-CM

## 2020-01-11 DIAGNOSIS — G8929 Other chronic pain: Secondary | ICD-10-CM

## 2020-01-11 DIAGNOSIS — M24159 Other articular cartilage disorders, unspecified hip: Secondary | ICD-10-CM | POA: Insufficient documentation

## 2020-01-11 DIAGNOSIS — M545 Low back pain, unspecified: Secondary | ICD-10-CM | POA: Insufficient documentation

## 2020-01-11 DIAGNOSIS — M0609 Rheumatoid arthritis without rheumatoid factor, multiple sites: Secondary | ICD-10-CM

## 2020-01-11 MED ORDER — CYCLOBENZAPRINE HCL 5 MG PO TABS: 5.0000 mg | ORAL_TABLET | Freq: Three times a day (TID) | ORAL | 1 refills | Status: DC | PRN

## 2020-01-11 MED ORDER — DICLOFENAC SODIUM 1 % EX GEL: 4.0000 g | Freq: Four times a day (QID) | CUTANEOUS | 0 refills | Status: DC

## 2020-01-11 MED ORDER — KETOROLAC TROMETHAMINE 60 MG/2ML IM SOLN
60.0000 mg | Freq: Once | INTRAMUSCULAR | Status: AC
  Administered 2020-01-11: 60 mg via INTRAMUSCULAR

## 2020-01-11 NOTE — Progress Notes (Signed)
Multilevel degenerative disc disease but no acute findings.

## 2020-01-11 NOTE — Patient Instructions (Signed)

## 2020-01-11 NOTE — Progress Notes (Signed)
Subjective:    Patient ID: Frances [name], female    DOB: 01-18-1976, 44 y.o.   MRN: [id number]  HPI  Patient is a 44 year old female with RA, chronic right hip pain with osteoarthritis of right hip, lumbar degenerative disc disease, fibromyalgia who presents to the clinic with worsening right low back pain.  Patient denies any new injury or trauma.  Patient denies any radiation of pain or numbness and tingling into legs.  Patient is not having any leg weakness, saddle anesthesia, bowel or bladder dysfunction.  She previously saw an orthopedist who recently gave her prednisone.  She is almost through with the taper.  Her orthopedist is retiring and she will need somebody else to manage this.  She admits to using hydrocodone as needed.  She did receive 40 back in October and has 18 left.  She does not use this daily.  She was recently taken off her diclofenac due to some lab abnormalities.  She will be following up with her sports medicine provider for this.  She has had her right low back pain for years she seems to be getting a little worse.  Seems to be worse with walking, sitting, standing.  Seems to be a little better when laying down most of the time.  She rates 5 out of 10 today.  In the past she has tried physical therapy and dry needling.  She admits she is not doing her exercises.  She is supposed to be getting some viscous injections in her right hip for her OA.  .. Active Ambulatory Problems    Diagnosis Date Noted  . Hypothyroidism 02/10/2008  . 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 DERMATITIS, ALLERGIC 06/05/2008  . 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 FIBROMYALGIA 02/10/2008  . FATIGUE 05/05/2008  . IRON, SERUM, ELEVATED 02/10/2008  . DYSPNEA 10/15/2011  . Bipolar I disorder ([place]) 09/16/2017  . Fibromyalgia 09/16/2017  . Chronic fatigue syndrome 09/16/2017  . MTHFR gene mutation ([place]) 09/20/2017  . TMJ (temporomandibular joint syndrome) 09/26/2017  . New  daily persistent headache 09/26/2017  . Rheumatoid arthritis 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 ([place]) 12/09/2017  . Anxiety 01/22/2018  . Borderline personality disorder ([place]) 01/22/2018  . Depression   . Bipolar 1 disorder, depressed, severe ([place]) 01/25/2018  . Elevated fasting glucose 04/08/2018  . Decreased visual acuity 08/30/2018  . Right corneal abrasion 08/30/2018  . Toenail fungus 09/09/2019  . Vasovagal response 09/09/2019  . Grief reaction 11/28/2019  . Chronic right hip pain 01/11/2020  . Chronic right-sided low back pain without sciatica 01/11/2020   Resolved Ambulatory Problems    Diagnosis Date Noted  . FEVER, RECURRENT 04/02/2009  . SINUSITIS- ACUTE-NOS 03/24/2008  . URI 01/17/2009  . ANKLE PAIN, BILATERAL 07/28/2008  . BACK PAIN 10/13/2008  . OPEN WOUND FT NO TOE ALONE WITHOUT MENTION COMP 03/28/2009   Past Medical History:  Diagnosis Date  . Arthritis   . Back pain, chronic   . Bipolar 1 disorder ([place])   . DDD (degenerative disc disease), lumbar   . IUD 2012       Review of Systems See HPI.     Objective:   Physical Exam        Assessment & Plan:  Frances [name] KitchenMarland KitchenLacoria was seen today for back pain.  Diagnoses and all orders for this visit:  Chronic right-sided low back pain without sciatica -     cyclobenzaprine (FLEXERIL) 5 MG tablet; Take 1 tablet (5 mg total) by  mouth 3 (three) times daily as needed for muscle spasms. -     diclofenac Sodium (VOLTAREN) 1 % GEL; Apply 4 g topically 4 (four) times daily. To affected joint. -     DG Lumbar Spine Complete -     ketorolac (TORADOL) injection 60 mg  Rheumatoid arthritis 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 ([facility]) -     diclofenac Sodium (VOLTAREN) 1 % GEL; Apply 4 g topically 4 (four) times daily. To affected joint.  DEGENERATIVE DISC DISEASE, LUMBAR SPINE -     diclofenac Sodium (VOLTAREN) 1 % GEL; Apply 4 g topically 4 (four) times daily. To affected joint. -     DG Lumbar  Spine Complete -     ketorolac (TORADOL) injection 60 mg  Chronic right hip pain -     diclofenac Sodium (VOLTAREN) 1 % GEL; Apply 4 g topically 4 (four) times daily. To affected joint. -     ketorolac (TORADOL) injection 60 mg   Symptoms did not seem consistent with any disc pathology.  I think this is more muscular and perhaps caused by some of her right hip pain.  Discussed certain exercises and stretches that could help this area.  Strongly encouraged massage therapy.  She is not using her TENS unit and I think this could be very beneficial.  Certainly use heat and icy hot patches as needed.  Toradol 60 mg given IM today.  She still has 18 tablets left of Norco.  I strongly encouraged her to use this sparingly.  Will call if needs refill.  5 mg of Flexeril given to use as muscle relaxer as needed.  We could consider formal physical therapy in the future.  I did go ahead and order another lumbar spine x-ray.  This has not been done in quite a while.  I did give her the name of Dr. Dianah Field here in our office that could help to manage some of her sports medicine needs.  Since she cannot take diclofenac at this time I did give her diclofenac gel to use up to 4 times a day.

## 2020-01-12 ENCOUNTER — Encounter: Payer: Self-pay | Admitting: Physician Assistant

## 2020-01-18 ENCOUNTER — Ambulatory Visit: Admit: 2020-01-18 | Discharge: 2020-01-19

## 2020-01-18 ENCOUNTER — Ambulatory Visit: Admit: 2020-01-18 | Discharge: 2020-01-19 | Attending: Sports Medicine | Primary: Sports Medicine

## 2020-01-19 DIAGNOSIS — M0600 Rheumatoid arthritis without rheumatoid factor, unspecified site: Principal | ICD-10-CM

## 2020-01-24 DIAGNOSIS — M797 Fibromyalgia: Principal | ICD-10-CM

## 2020-01-24 MED ORDER — DULOXETINE 30 MG CAPSULE,DELAYED RELEASE
ORAL_CAPSULE | Freq: Every day | ORAL | 5 refills | 30 days | Status: CP
Start: 2020-01-24 — End: 2021-01-23
  Filled 2020-01-26: qty 60, 30d supply, fill #0

## 2020-01-26 MED FILL — HYDROXYCHLOROQUINE 200 MG TABLET: ORAL | 30 days supply | Qty: 60 | Fill #2

## 2020-01-26 MED FILL — DULOXETINE 30 MG CAPSULE,DELAYED RELEASE: 30 days supply | Qty: 60 | Fill #0 | Status: AC

## 2020-01-26 MED FILL — HYDROXYCHLOROQUINE 200 MG TABLET: 30 days supply | Qty: 60 | Fill #2 | Status: AC

## 2020-01-31 ENCOUNTER — Encounter: Payer: Self-pay | Admitting: Sports Medicine

## 2020-01-31 ENCOUNTER — Other Ambulatory Visit: Payer: Self-pay

## 2020-01-31 ENCOUNTER — Ambulatory Visit (INDEPENDENT_AMBULATORY_CARE_PROVIDER_SITE_OTHER): Payer: Self-pay | Admitting: Sports Medicine

## 2020-01-31 DIAGNOSIS — M51369 Other intervertebral disc degeneration, lumbar region without mention of lumbar back pain or lower extremity pain: Secondary | ICD-10-CM

## 2020-01-31 DIAGNOSIS — M5136 Other intervertebral disc degeneration, lumbar region: Secondary | ICD-10-CM

## 2020-01-31 MED ORDER — DICLOFENAC SODIUM 75 MG PO TBEC: 75.0000 mg | DELAYED_RELEASE_TABLET | Freq: Two times a day (BID) | ORAL | 3 refills | Status: DC

## 2020-01-31 NOTE — Assessment & Plan Note (Signed)
This pleasant 44 year old female has longstanding low back pain, chronic axial, DDD on x-rays. She just came off of a taper of prednisone, switching meloxicam to diclofenac. Adding formal physical therapy, return to see me in 4 to 6 weeks, MRI for interventional planning if no better. We did discuss anatomy and developmental anthropology of the lumbar spine. She is trying to get Social Security disability.

## 2020-01-31 NOTE — Progress Notes (Signed)
    Procedures performed today:    None.  Independent interpretation of tests performed by another provider:   I personally reviewed Frances [name]'s lumbar spine x-rays which show mild multilevel lumbar DDD  Impression and Recommendations:    DDD (degenerative disc disease), lumbar This pleasant 44 year old female has longstanding low back pain, chronic axial, DDD on x-rays. She just came off of a taper of prednisone, switching meloxicam to diclofenac. Adding formal physical therapy, return to see me in 4 to 6 weeks, MRI for interventional planning if no better. We did discuss anatomy and developmental anthropology of the lumbar spine. She is trying to get Social Security disability.    ___________________________________________ Gwen Her. Dianah Field, M.D., ABFM., CAQSM. Primary Care and [place] Instructor of [place] [facility]

## 2020-02-10 ENCOUNTER — Other Ambulatory Visit: Payer: Self-pay

## 2020-02-10 ENCOUNTER — Ambulatory Visit (INDEPENDENT_AMBULATORY_CARE_PROVIDER_SITE_OTHER): Payer: Self-pay | Admitting: Rehabilitative and Restorative Service Providers"

## 2020-02-10 ENCOUNTER — Encounter: Payer: Self-pay | Admitting: Rehabilitative and Restorative Service Providers"

## 2020-02-10 DIAGNOSIS — M6281 Muscle weakness (generalized): Secondary | ICD-10-CM

## 2020-02-10 DIAGNOSIS — G8929 Other chronic pain: Secondary | ICD-10-CM

## 2020-02-10 DIAGNOSIS — M545 Low back pain, unspecified: Secondary | ICD-10-CM

## 2020-02-10 DIAGNOSIS — R29898 Other symptoms and signs involving the musculoskeletal system: Secondary | ICD-10-CM

## 2020-02-10 NOTE — Patient Instructions (Addendum)
[email or web address] Access Code: [email or web address]  URL: [email or web address]  Date: 02/10/2020  Prepared by: Gillermo Murdoch   Exercises  Prone Press Up - 10 reps - 1 sets - 2 sec hold - 2x daily - 7x weekly  Supine Piriformis Stretch Pulling Heel to Hip - 3 reps - 1 sets - 30 sec hold - 2x daily - 7x weekly  Supine Transversus Abdominis Bracing - Hands on Stomach - 10 reps - 1 sets - 10 sec hold - 2x daily - 7x weekly  Sit to Stand - 10 reps - 1 sets - 5 sec hold - 2x daily - 7x weekly with core engaged
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
## 2020-02-13 ENCOUNTER — Ambulatory Visit (INDEPENDENT_AMBULATORY_CARE_PROVIDER_SITE_OTHER): Payer: Self-pay | Admitting: Physical Therapy

## 2020-02-13 ENCOUNTER — Other Ambulatory Visit: Payer: Self-pay

## 2020-02-13 DIAGNOSIS — R29898 Other symptoms and signs involving the musculoskeletal system: Secondary | ICD-10-CM

## 2020-02-13 DIAGNOSIS — M6281 Muscle weakness (generalized): Secondary | ICD-10-CM

## 2020-02-13 DIAGNOSIS — G8929 Other chronic pain: Secondary | ICD-10-CM

## 2020-02-13 DIAGNOSIS — M545 Low back pain, unspecified: Secondary | ICD-10-CM

## 2020-02-13 NOTE — Therapy (Signed)
[place], [place], [address] Phone: [phone]   Fax:  [phone]  Physical Therapy Treatment  Patient Details  Name: Frances [name] MRN: [id number] Date of Birth: [birth year] Referring Provider (PT): Dr Dianah Field   Encounter Date: 02/13/2020  PT End of Session - 02/13/20 1149    Visit Number  2    Number of Visits  12    Date for PT Re-Evaluation  03/23/20    PT Start Time  [id number]    PT Stop Time  1229    PT Time Calculation (min)  40 min    Activity Tolerance  Patient tolerated treatment well;No increased pain    Behavior During Therapy  WFL for tasks assessed/performed       Past Medical History:  Diagnosis Date  . Anxiety   . Arthritis   . Back pain, chronic   . Bipolar 1 disorder ([place])   . Chronic fatigue syndrome   . DDD (degenerative disc disease), lumbar   . Depression   . Fibromyalgia   . IUD 2012   Mirena  . MTHFR gene mutation [facility])     Past Surgical History:  Procedure Laterality Date  . COLONOSCOPY WITH PROPOFOL N/A 02/25/2017   Procedure: COLONOSCOPY WITH PROPOFOL;  Surgeon: Arta Silence, MD;  [facility];  Service: Endoscopy;  Laterality: N/A;  . left knee lateral release  1993  . scar tisue removal  03/2005   left ankle  . TONSILLECTOMY  age 14's    There were no vitals filed for this visit.  Subjective Assessment - 02/13/20 1149    Subjective  Pt reports her Rt shoulder blade area became very painful after eval was 8/10.  Her low back isn't "too bad" today.  She states her legs and abdominal were also sore from the exercises.    Patient Stated Goals  would like to get rid of back    Currently in Pain?  Yes    Pain Score  2     Pain Location  Back    Pain Orientation  Left;Right;Lower;Upper    Pain Descriptors / Indicators  Aching;Dull    Aggravating Factors   prolonged sitting and standing ; feeding the animals    Pain Relieving Factors   vicodan, TENS         [facility] PT Assessment - 02/13/20 0001      Assessment   Medical Diagnosis  Lumbar DDD    Referring Provider (PT)  Dr Dianah Field    Onset Date/Surgical Date  01/06/20    Hand Dominance  Right    Next MD Visit  03/13/2020     Prior Therapy  yes here for LBP        [facility] Adult PT Treatment/Exercise - 02/13/20 0001      Self-Care   Self-Care  Other Self-Care Comments    Other Self-Care Comments   pt re-educated on self massage with ball to periscapular musculature and low back musculature. pt returned demo with cues.       Lumbar Exercises: Stretches   Passive Hamstring Stretch  Right;Left;1 rep;30 seconds    Prone on Elbows Stretch  10 seconds;4 reps    Quad Stretch  Right;Left;1 rep;30 seconds    Piriformis Stretch  Right;Left;2 reps;30 seconds      Lumbar Exercises: Aerobic   Nustep  L4: arms/legs x 6 min      Lumbar Exercises: Seated   Sit to Stand  5  reps   core engaged      Lumbar Exercises: Supine   Ab Set  5 reps;5 seconds    Bridge  10 reps;2 seconds   with ab set     Lumbar Exercises: Sidelying   Hip Abduction  Right;Left;5 reps   2 sets     Lumbar Exercises: Prone   Opposite Arm/Leg Raise  Right arm/Left leg;Left arm/Right leg;5 reps   2 sets   Other Prone Lumbar Exercises  POE: arm reaches x 5 reps each side (alternating)             PT Education - 02/13/20 1237    Education Details  HEP updated.    Person(s) Educated  Patient    Methods  Explanation;Handout;Verbal cues;Demonstration    Comprehension  Verbalized understanding;Returned demonstration          PT Long Term Goals - 02/10/20 1738      PT LONG TERM GOAL #1   Title  Decrease pain in back and hip 25-50% allowing patient to perfrom normal functional activities with greater ease    Time  6    Period  Weeks    Status  New    Target Date  03/23/20      PT LONG TERM GOAL #2   Title  Increase functional strength Rt LE to 4+/5 to 5-/5 throughout    Time  6     Period  Weeks    Status  New    Target Date  03/23/20      PT LONG TERM GOAL #3   Title  Patient reports tolerating 10-15 min of walking on level surfaces 2-3 times/week to improve fitness level and ADL tolerance    Time  6    Period  Weeks    Status  New    Target Date  03/23/20      PT LONG TERM GOAL #4   Title  Independent in HEP    Time  6    Period  Weeks    Status  New    Target Date  03/23/20      PT LONG TERM GOAL #5   Title  Iprove FOTO to </= 35% limitation    Time  6    Period  Weeks    Status  New            Plan - 02/13/20 1203    Clinical Impression Statement  Pt unable to tolerate modified pigeon pose with RLE; kept original supine piriformis stretch with good tolerance. Pt able to complete TA contraction with minimal cues.  She tolerated all other exercises well, performing 5 reps, 2 sets of exercises to avoid increased soreness. Encouraged pt to perform self care of TENS, MHP and walking outside of therapy sessions to assist with pain control and continued progress.  Goals are ongoing.    Personal Factors and Comorbidities  Comorbidity 1;Fitness    Comorbidities  pain; sedentary lifestyle    Examination-Activity Limitations  Locomotion Level;Carry;Sleep;Sit;Stand;Lift    Stability/Clinical Decision Making  Stable/Uncomplicated    Rehab Potential  Good    PT Frequency  2x / week    PT Duration  6 weeks    PT Treatment/Interventions  ADLs/Self Care Home Management;Cryotherapy;Electrical Stimulation;Iontophoresis 4mg /ml Dexamethasone;Moist Heat;Traction;Ultrasound;Functional mobility training;Therapeutic activities;Therapeutic exercise;Neuromuscular re-education;Patient/family education;Manual techniques;Dry needling;Taping    PT Next Visit Plan  progress with core stabilization and overall conditioning; continue education re-management of chronic pain and realistic expectatioins of therapy; manual work and  modalities as indicated    PT Home Exercise Plan   [id number]    Consulted and Agree with Plan of Care  Patient       Patient will benefit from skilled therapeutic intervention in order to improve the following deficits and impairments:  Abnormal gait, Decreased range of motion, Decreased activity tolerance, Pain, Decreased mobility, Decreased strength, Postural dysfunction  Visit Diagnosis: Chronic bilateral low back pain without sciatica  Other symptoms and signs involving the musculoskeletal system  Muscle weakness (generalized)     Problem List Patient Active Problem List   Diagnosis Date Noted  . Chronic right hip pain 01/11/2020  . Chronic right-sided low back pain without sciatica 01/11/2020  . Grief reaction 11/28/2019  . Toenail fungus 09/09/2019  . Vasovagal response 09/09/2019  . Decreased visual acuity 08/30/2018  . Right corneal abrasion 08/30/2018  . Elevated fasting glucose 04/08/2018  . Bipolar 1 disorder, depressed, severe ([place]) 01/25/2018  . Depression   . Anxiety 01/22/2018  . Borderline personality disorder ([place]) 01/22/2018  . Rheumatoid arthritis 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 ([place]) 12/09/2017  . TMJ (temporomandibular joint syndrome) 09/26/2017  . New daily persistent headache 09/26/2017  . MTHFR gene mutation ([place]) 09/20/2017  . Bipolar I disorder ([place]) 09/16/2017  . Fibromyalgia 09/16/2017  . Chronic fatigue syndrome 09/16/2017  . DYSPNEA 10/15/2011  . DERMATITIS, ALLERGIC 06/05/2008  . FATIGUE 05/05/2008  . Hypothyroidism 02/10/2008  . 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 DDD (degenerative disc disease), lumbar 02/10/2008  . FIBROMYALGIA 02/10/2008  . IRON, SERUM, ELEVATED 02/10/2008   Kerin Perna, PTA 02/13/20 12:50 PM  [place], [place], [address] Phone: [phone]   Fax:  [phone]  Name: Frances [name] MRN: [id number] Date of Birth: 1976/10/12

## 2020-02-13 NOTE — Patient Instructions (Signed)
Access Code: [email or web address]  URL: [email or web address]  Date: 02/13/2020  Prepared by: Kerin Perna   Exercises  Sit to Stand - 5-10 reps - 2 sets - 3-5 hold - 1x daily - 7x weekly  Supine Transversus Abdominis Bracing - Hands on Stomach - 10 reps - 1 sets - 10 sec hold - 2x daily - 7x weekly  Supine Bridge - 10 reps - 1 sets - 3-5 hold - 1x daily - 7x weekly  Sidelying Hip Abduction - 5 reps - 2 sets - 1x daily - 7x weekly  Prone Press Up on Elbows - 5 reps - 1 sets - 10 hold - 2x daily - 7x weekly  Supine Piriformis Stretch - 2 reps - 1 sets - 20-30 hold - 2x daily - 7x weekly

## 2020-02-15 ENCOUNTER — Institutional Professional Consult (permissible substitution): Admit: 2020-02-15 | Discharge: 2020-02-16

## 2020-02-15 ENCOUNTER — Other Ambulatory Visit: Payer: Self-pay

## 2020-02-15 ENCOUNTER — Ambulatory Visit (INDEPENDENT_AMBULATORY_CARE_PROVIDER_SITE_OTHER): Payer: Self-pay | Admitting: Physical Therapy

## 2020-02-15 DIAGNOSIS — M545 Low back pain, unspecified: Secondary | ICD-10-CM

## 2020-02-15 DIAGNOSIS — M6281 Muscle weakness (generalized): Secondary | ICD-10-CM

## 2020-02-15 DIAGNOSIS — G8929 Other chronic pain: Secondary | ICD-10-CM

## 2020-02-15 DIAGNOSIS — R29898 Other symptoms and signs involving the musculoskeletal system: Secondary | ICD-10-CM

## 2020-02-15 NOTE — Therapy (Signed)
[place], [place], [address] Phone: [phone]   Fax:  [phone]  Physical Therapy Treatment  Patient Details  Name: Frances [name] MRN: [id number] Date of Birth: 1976-04-23 Referring Provider (PT): Dr Dianah Field   Encounter Date: 02/15/2020  PT End of Session - 02/15/20 1246    Visit Number  3    Number of Visits  12    Date for PT Re-Evaluation  03/23/20    PT Start Time  [id number]    PT Stop Time  1226    PT Time Calculation (min)  38 min    Activity Tolerance  Patient tolerated treatment well;No increased pain    Behavior During Therapy  WFL for tasks assessed/performed       Past Medical History:  Diagnosis Date  . Anxiety   . Arthritis   . Back pain, chronic   . Bipolar 1 disorder ([place])   . Chronic fatigue syndrome   . DDD (degenerative disc disease), lumbar   . Depression   . Fibromyalgia   . IUD 2012   Mirena  . MTHFR gene mutation [facility])     Past Surgical History:  Procedure Laterality Date  . COLONOSCOPY WITH PROPOFOL N/A 02/25/2017   Procedure: COLONOSCOPY WITH PROPOFOL;  Surgeon: Arta Silence, MD;  Location: [facility];  Service: Endoscopy;  Laterality: N/A;  . left knee lateral release  1993  . scar tisue removal  03/2005   left ankle  . TONSILLECTOMY  age 44's    There were no vitals filed for this visit.  Subjective Assessment - 02/15/20 1154    Subjective  Pt reports she did well after last session; not nearly as sore as last visit.  Her shoulder blade area is not bothering her today.    Pertinent History  arthritis; depression; allergies; unrelated surgeries; hip pain Rt > Lt; fibromyalgia    Patient Stated Goals  would like to get rid of back    Currently in Pain?  Yes    Pain Score  2     Pain Location  Back    Pain Orientation  Right;Left;Lower    Pain Descriptors / Indicators  Aching    Aggravating Factors   picking up after horses    Pain  Relieving Factors  medicine, TENS         [facility] PT Assessment - 02/15/20 0001      Assessment   Medical Diagnosis  Lumbar DDD    Referring Provider (PT)  Dr Dianah Field    Onset Date/Surgical Date  01/06/20    Hand Dominance  Right    Next MD Visit  03/13/2020     Prior Therapy  yes here for LBP        [facility] Adult PT Treatment/Exercise - 02/15/20 0001      Therapeutic Activites    Therapeutic Activities  Other Therapeutic Activities    Other Therapeutic Activities  pt educated on body mechanics for utilizing pitch fork (bend at knees and hips, utilize arms to swing load); pt returned demo x 4 reps with cues.       Lumbar Exercises: Stretches   Passive Hamstring Stretch  Right;Left;1 rep;30 seconds    Lower Trunk Rotation  5 reps;10 seconds   arms in Y.   Piriformis Stretch  Right;Left;2 reps;30 seconds    Other Lumbar Stretch Exercise  childs pose x 10 sec, x 3 reps; repeated with lateral flexion.  Lumbar Exercises: Aerobic   Tread Mill  1.8-2.2 mph x 5 min       Lumbar Exercises: Supine   Bridge  10 reps;2 seconds   with ab set   Bridge with March  5 reps;5 seconds    Other Supine Lumbar Exercises  hooklying, core engaged, bilat arm flexion overhead to/from waist holding ball x 5, 2# ball x 3, 4# ball x 3      Lumbar Exercises: Sidelying   Hip Abduction  Right;Left;5 reps   2 sets   Other Sidelying Lumbar Exercises  thoracic rotation -open book x 5 each side.       Lumbar Exercises: Prone   Opposite Arm/Leg Raise  Right arm/Left leg;Left arm/Right leg;5 reps   pain with lifting RLE   Other Prone Lumbar Exercises  POE: arm reaches x 5 reps each side (alternating)    Other Prone Lumbar Exercises  hip ext with knee flex x 5 reps each side.       Lumbar Exercises: Quadruped   Madcat/Old Horse  5 reps   5 sec hold   Other Quadruped Lumbar Exercises  primal push up x 5 sec hold x 5 reps                  PT Long Term Goals - 02/10/20 1738      PT  LONG TERM GOAL #1   Title  Decrease pain in back and hip 25-50% allowing patient to perfrom normal functional activities with greater ease    Time  6    Period  Weeks    Status  New    Target Date  03/23/20      PT LONG TERM GOAL #2   Title  Increase functional strength Rt LE to 4+/5 to 5-/5 throughout    Time  6    Period  Weeks    Status  New    Target Date  03/23/20      PT LONG TERM GOAL #3   Title  Patient reports tolerating 10-15 min of walking on level surfaces 2-3 times/week to improve fitness level and ADL tolerance    Time  6    Period  Weeks    Status  New    Target Date  03/23/20      PT LONG TERM GOAL #4   Title  Independent in HEP    Time  6    Period  Weeks    Status  New    Target Date  03/23/20      PT LONG TERM GOAL #5   Title  Iprove FOTO to </= 35% limitation    Time  6    Period  Weeks    Status  New            Plan - 02/15/20 1247    Clinical Impression Statement  Pt reported some increase in Rt LBP with prone opp arm/leg exercises with Rt hip ext. Pt able to perform Rt hip ext with knee bent without pain.  Increased difficulty of exercises without increase in pain.  Pt required multiple cues for improved form when simulating using a pitch fork to shovel horse poop.  Progressing towards goals.    Personal Factors and Comorbidities  Comorbidity 1;Fitness    Comorbidities  pain; sedentary lifestyle    Examination-Activity Limitations  Locomotion Level;Carry;Sleep;Sit;Stand;Lift    Stability/Clinical Decision Making  Stable/Uncomplicated    Rehab Potential  Good    PT Frequency  2x / week    PT Duration  6 weeks    PT Treatment/Interventions  ADLs/Self Care Home Management;Cryotherapy;Electrical Stimulation;Iontophoresis 4mg /ml Dexamethasone;Moist Heat;Traction;Ultrasound;Functional mobility training;Therapeutic activities;Therapeutic exercise;Neuromuscular re-education;Patient/family education;Manual techniques;Dry needling;Taping    PT Next  Visit Plan  continue overall conditioning and spinal stabilization; review body mechanics.    PT Home Exercise Plan  [id number]    Consulted and Agree with Plan of Care  Patient       Patient will benefit from skilled therapeutic intervention in order to improve the following deficits and impairments:  Abnormal gait, Decreased range of motion, Decreased activity tolerance, Pain, Decreased mobility, Decreased strength, Postural dysfunction  Visit Diagnosis: Chronic bilateral low back pain without sciatica  Other symptoms and signs involving the musculoskeletal system  Muscle weakness (generalized)     Problem List Patient Active Problem List   Diagnosis Date Noted  . Chronic right hip pain 01/11/2020  . Chronic right-sided low back pain without sciatica 01/11/2020  . Grief reaction 11/28/2019  . Toenail fungus 09/09/2019  . Vasovagal response 09/09/2019  . Decreased visual acuity 08/30/2018  . Right corneal abrasion 08/30/2018  . Elevated fasting glucose 04/08/2018  . Bipolar 1 disorder, depressed, severe ([place]) 01/25/2018  . Depression   . Anxiety 01/22/2018  . Borderline personality disorder ([place]) 01/22/2018  . Rheumatoid arthritis 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 ([place]) 12/09/2017  . TMJ (temporomandibular joint syndrome) 09/26/2017  . New daily persistent headache 09/26/2017  . MTHFR gene mutation ([place]) 09/20/2017  . Bipolar I disorder ([place]) 09/16/2017  . Fibromyalgia 09/16/2017  . Chronic fatigue syndrome 09/16/2017  . DYSPNEA 10/15/2011  . DERMATITIS, ALLERGIC 06/05/2008  . FATIGUE 05/05/2008  . Hypothyroidism 02/10/2008  . 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 DDD (degenerative disc disease), lumbar 02/10/2008  . FIBROMYALGIA 02/10/2008  . IRON, SERUM, ELEVATED 02/10/2008   Kerin Perna, PTA 02/15/20 1:12 PM  [facility] [place], [place], [address] Phone:  [phone]   Fax:  [phone]  Name: Frances [name] MRN: [id number] Date of Birth: 19-Sep-1976

## 2020-02-20 ENCOUNTER — Other Ambulatory Visit: Payer: Self-pay

## 2020-02-20 ENCOUNTER — Encounter: Payer: Self-pay | Admitting: Physical Therapy

## 2020-02-20 ENCOUNTER — Ambulatory Visit (INDEPENDENT_AMBULATORY_CARE_PROVIDER_SITE_OTHER): Payer: Self-pay | Admitting: Physical Therapy

## 2020-02-20 DIAGNOSIS — M545 Low back pain, unspecified: Secondary | ICD-10-CM

## 2020-02-20 DIAGNOSIS — G8929 Other chronic pain: Secondary | ICD-10-CM

## 2020-02-20 DIAGNOSIS — M6281 Muscle weakness (generalized): Secondary | ICD-10-CM

## 2020-02-20 DIAGNOSIS — R29898 Other symptoms and signs involving the musculoskeletal system: Secondary | ICD-10-CM

## 2020-02-20 NOTE — Therapy (Signed)
[place], [place], [address] Phone: [phone]   Fax:  [phone]  Physical Therapy Treatment  Patient Details  Name: Frances [name] MRN: [id number] Date of Birth: [birth year] Referring Provider (PT): Dr Dianah Field   Encounter Date: 02/20/2020  PT End of Session - 02/20/20 1143    Visit Number  4    Number of Visits  12    Date for PT Re-Evaluation  03/23/20    PT Start Time  1143    PT Stop Time  1226    PT Time Calculation (min)  43 min       Past Medical History:  Diagnosis Date  . Anxiety   . Arthritis   . Back pain, chronic   . Bipolar 1 disorder ([place])   . Chronic fatigue syndrome   . DDD (degenerative disc disease), lumbar   . Depression   . Fibromyalgia   . IUD 2012   Mirena  . MTHFR gene mutation [facility])     Past Surgical History:  Procedure Laterality Date  . COLONOSCOPY WITH PROPOFOL N/A 02/25/2017   Procedure: COLONOSCOPY WITH PROPOFOL;  Surgeon: Arta Silence, MD;  [facility];  Service: Endoscopy;  Laterality: N/A;  . left knee lateral release  1993  . scar tisue removal  03/2005   left ankle  . TONSILLECTOMY  age 73's    There were no vitals filed for this visit.  Subjective Assessment - 02/20/20 1144    Subjective  Pt reports she has been very sore since last session.  She went to bed early and took pain medicine to deal with pain.  She continued to do exercises of HEP, despite soreness.    Pertinent History  arthritis; depression; allergies; unrelated surgeries; hip pain Rt > Lt; fibromyalgia    Patient Stated Goals  would like to get rid of back    Currently in Pain?  Yes    Pain Score  4     Pain Location  Generalized   shoulders, thighs, low back   Pain Descriptors / Indicators  Sore    Aggravating Factors   some exercises,  picking up after horses    Pain Relieving Factors  medicine, hot shower, TENS         [facility] PT Assessment -  02/20/20 0001      Assessment   Medical Diagnosis  Lumbar DDD    Referring Provider (PT)  Dr Dianah Field    Onset Date/Surgical Date  01/06/20    Hand Dominance  Right    Next MD Visit  03/13/2020     Prior Therapy  yes here for LBP       [facility] Adult PT Treatment/Exercise - 02/20/20 0001      Lumbar Exercises: Stretches   Passive Hamstring Stretch  Right;Left;2 reps;20 seconds   supine with strap   Quad Stretch  Right;Left;30 seconds;2 reps    Piriformis Stretch  Right;Left;2 reps;30 seconds    Other Lumbar Stretch Exercise  childs pose x 10 sec, x 3 reps; repeated with lateral flexion.       Lumbar Exercises: Aerobic   Tread Mill  1.5-2.0 mph x 5 min       Lumbar Exercises: Machines for Strengthening   Other Lumbar Machine Exercise  Lat pull down with 2 plates x 5 reps      Lumbar Exercises: Standing   Other Standing Lumbar Exercises  3 step ups onto 10" step with BUE  on back of truck, to similate stepping into Lt stirrup on saddle.       Lumbar Exercises: Seated   Sit to Stand  5 reps   core engaged    Other Seated Lumbar Exercises  5 reps of sit to stand with side step in between reps       Lumbar Exercises: Supine   Bridge  5 reps;5 seconds    Bridge with March  5 reps;4 seconds    Other Supine Lumbar Exercises  hooklying, core engaged, bilat arm flexion overhead to/from waist holding 3#       Lumbar Exercises: Sidelying   Hip Abduction  Right;Left;5 reps   rainbow arcs with LE.    Other Sidelying Lumbar Exercises  thoracic rotation -open book x 5 each side. repeated with red band x 2                  PT Long Term Goals - 02/10/20 1738      PT LONG TERM GOAL #1   Title  Decrease pain in back and hip 25-50% allowing patient to perfrom normal functional activities with greater ease    Time  6    Period  Weeks    Status  New    Target Date  03/23/20      PT LONG TERM GOAL #2   Title  Increase functional strength Rt LE to 4+/5 to 5-/5 throughout     Time  6    Period  Weeks    Status  New    Target Date  03/23/20      PT LONG TERM GOAL #3   Title  Patient reports tolerating 10-15 min of walking on level surfaces 2-3 times/week to improve fitness level and ADL tolerance    Time  6    Period  Weeks    Status  New    Target Date  03/23/20      PT LONG TERM GOAL #4   Title  Independent in HEP    Time  6    Period  Weeks    Status  New    Target Date  03/23/20      PT LONG TERM GOAL #5   Title  Iprove FOTO to </= 35% limitation    Time  6    Period  Weeks    Status  New            Plan - 02/20/20 1242    Clinical Impression Statement  Pt reporting soreness since strengthening exercises were progressed last session.  Pt able to complete sets of 5 exercises during session, without complaint of increased pain, however pt reported slight increase in LBP and thigh soreness at end of session. Modalities deferred since pt has MHP/ TENS at home.  Encouraged pt to begin walking program of 15 min per day to assist with increased exercise tolerance.  Plan to progress towards LTGs.    Personal Factors and Comorbidities  Comorbidity 1;Fitness    Comorbidities  pain; sedentary lifestyle    Examination-Activity Limitations  Locomotion Level;Carry;Sleep;Sit;Stand;Lift    Stability/Clinical Decision Making  Stable/Uncomplicated    Rehab Potential  Good    PT Frequency  2x / week    PT Duration  6 weeks    PT Treatment/Interventions  ADLs/Self Care Home Management;Cryotherapy;Electrical Stimulation;Iontophoresis 4mg /ml Dexamethasone;Moist Heat;Traction;Ultrasound;Functional mobility training;Therapeutic activities;Therapeutic exercise;Neuromuscular re-education;Patient/family education;Manual techniques;Dry needling;Taping    PT Next Visit Plan  continue overall conditioning and spinal stabilization; review  body mechanics.    PT Home Exercise Plan  [id number]    Consulted and Agree with Plan of Care  Patient       Patient will benefit  from skilled therapeutic intervention in order to improve the following deficits and impairments:  Abnormal gait, Decreased range of motion, Decreased activity tolerance, Pain, Decreased mobility, Decreased strength, Postural dysfunction  Visit Diagnosis: Chronic bilateral low back pain without sciatica  Other symptoms and signs involving the musculoskeletal system  Muscle weakness (generalized)     Problem List Patient Active Problem List   Diagnosis Date Noted  . Chronic right hip pain 01/11/2020  . Chronic right-sided low back pain without sciatica 01/11/2020  . Grief reaction 11/28/2019  . Toenail fungus 09/09/2019  . Vasovagal response 09/09/2019  . Decreased visual acuity 08/30/2018  . Right corneal abrasion 08/30/2018  . Elevated fasting glucose 04/08/2018  . Bipolar 1 disorder, depressed, severe ([place]) 01/25/2018  . Depression   . Anxiety 01/22/2018  . Borderline personality disorder ([place]) 01/22/2018  . Rheumatoid arthritis 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 ([place]) 12/09/2017  . TMJ (temporomandibular joint syndrome) 09/26/2017  . New daily persistent headache 09/26/2017  . MTHFR gene mutation ([place]) 09/20/2017  . Bipolar I disorder ([place]) 09/16/2017  . Fibromyalgia 09/16/2017  . Chronic fatigue syndrome 09/16/2017  . DYSPNEA 10/15/2011  . DERMATITIS, ALLERGIC 06/05/2008  . FATIGUE 05/05/2008  . Hypothyroidism 02/10/2008  . 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 DDD (degenerative disc disease), lumbar 02/10/2008  . FIBROMYALGIA 02/10/2008  . IRON, SERUM, ELEVATED 02/10/2008   Kerin Perna, PTA 02/20/20 3:56 PM  [place], [place], [address] Phone: [phone]   Fax:  [phone]  Name: Frances [name] MRN: [id number] Date of Birth: [birth year]

## 2020-02-21 ENCOUNTER — Ambulatory Visit: Admit: 2020-02-21 | Discharge: 2020-02-22

## 2020-02-22 MED FILL — DULOXETINE 30 MG CAPSULE,DELAYED RELEASE: 30 days supply | Qty: 60 | Fill #1 | Status: AC

## 2020-02-22 MED FILL — HYDROXYCHLOROQUINE 200 MG TABLET: 30 days supply | Qty: 60 | Fill #3 | Status: AC

## 2020-02-22 MED FILL — HYDROXYCHLOROQUINE 200 MG TABLET: ORAL | 30 days supply | Qty: 60 | Fill #3

## 2020-02-22 MED FILL — DULOXETINE 30 MG CAPSULE,DELAYED RELEASE: ORAL | 30 days supply | Qty: 60 | Fill #1

## 2020-02-23 ENCOUNTER — Encounter: Payer: Self-pay | Admitting: Physical Therapy

## 2020-02-23 ENCOUNTER — Ambulatory Visit (INDEPENDENT_AMBULATORY_CARE_PROVIDER_SITE_OTHER): Payer: Self-pay | Admitting: Physical Therapy

## 2020-02-23 ENCOUNTER — Other Ambulatory Visit: Payer: Self-pay

## 2020-02-23 DIAGNOSIS — M25551 Pain in right hip: Secondary | ICD-10-CM

## 2020-02-23 DIAGNOSIS — M545 Low back pain, unspecified: Secondary | ICD-10-CM

## 2020-02-23 DIAGNOSIS — G8929 Other chronic pain: Secondary | ICD-10-CM

## 2020-02-23 DIAGNOSIS — R29898 Other symptoms and signs involving the musculoskeletal system: Secondary | ICD-10-CM

## 2020-02-23 DIAGNOSIS — M6281 Muscle weakness (generalized): Secondary | ICD-10-CM

## 2020-02-23 NOTE — Therapy (Signed)
[place], [place], [address] Phone: [phone]   Fax:  [phone]  Physical Therapy Treatment  Patient Details  Name: Frances [name] MRN: [id number] Date of Birth: Feb 18, 1981 Referring Provider (PT): Dr Dianah Field   Encounter Date: 02/23/2020  PT End of Session - 02/23/20 1621    Visit Number  5    Number of Visits  12    Date for PT Re-Evaluation  03/23/20    PT Start Time  1534    PT Stop Time  1610    PT Time Calculation (min)  36 min    Activity Tolerance  Patient tolerated treatment well       Past Medical History:  Diagnosis Date  . Anxiety   . Arthritis   . Back pain, chronic   . Bipolar 1 disorder ([place])   . Chronic fatigue syndrome   . DDD (degenerative disc disease), lumbar   . Depression   . Fibromyalgia   . IUD 2012   Mirena  . MTHFR gene mutation [facility])     Past Surgical History:  Procedure Laterality Date  . COLONOSCOPY WITH PROPOFOL N/A 02/25/2017   Procedure: COLONOSCOPY WITH PROPOFOL;  Surgeon: Arta Silence, MD;  [facility];  Service: Endoscopy;  Laterality: N/A;  . left knee lateral release  1993  . scar tisue removal  03/2005   left ankle  . TONSILLECTOMY  age 19's    There were no vitals filed for this visit.  Subjective Assessment - 02/23/20 1538    Subjective  Pt reports she walked for 10 min yesterday.  Otherwise no new changes.    Pertinent History  arthritis; depression; allergies; unrelated surgeries; hip pain Rt > Lt; fibromyalgia    Patient Stated Goals  would like to get rid of back    Currently in Pain?  No/denies    Pain Score  0-No pain         [facility] PT Assessment - 02/23/20 0001      Assessment   Medical Diagnosis  Lumbar DDD    Referring Provider (PT)  Dr Dianah Field    Onset Date/Surgical Date  01/06/20    Hand Dominance  Right    Next MD Visit  03/13/2020     Prior Therapy  yes here for LBP       Strength   Right Hip Flexion  5/5    Right Hip Extension  4/5    Right Hip ABduction  4+/5    Left Hip Flexion  5/5    Left Hip Extension  3+/5    Left Hip ABduction  4+/5       [facility] Adult PT Treatment/Exercise - 02/23/20 0001      Lumbar Exercises: Stretches   Passive Hamstring Stretch  Right;Left;2 reps;20 seconds   supine with strap   Lower Trunk Rotation  5 reps;10 seconds   arms in Y.   Quad Stretch  Right;Left;30 seconds;2 reps   prone with strap   Quad Stretch Limitations  some discomfort in low back     Piriformis Stretch  Right;Left;2 reps;30 seconds    Other Lumbar Stretch Exercise  childs pose x 10 sec, x 3 reps; repeated with lateral flexion.       Lumbar Exercises: Aerobic   Tread Mill  1.8- 2.0 mph x 5 min       Lumbar Exercises: Standing   Other Standing Lumbar Exercises  opp arm/leg lifts ( lifting hands  off of cabinet- overhead, and performing hip ext) x 7 reps each side.      Lumbar Exercises: Supine   Bridge  5 reps;5 seconds        Straight Leg Raise  5 reps   each leg with core engaged.      Lumbar Exercises: Sidelying   Hip Abduction  Right;Left;5 reps      Lumbar Exercises: Prone   Straight Leg Raise  5 reps   core engaged.   Other Prone Lumbar Exercises  POE: arm reaches x 5 reps each side (alternating)      Lumbar Exercises: Quadruped   Madcat/Old Horse  5 reps   and wag the tail (side to side)     Manual Therapy   Manual Therapy  Soft tissue mobilization    Manual therapy comments  i strips of sensitive skin rock tape applied to bilat lumbar paraspinals to increase proprioception and postural awareness.     Soft tissue mobilization  IASTM to bilat lumbar paraspinals to decrease fascial restrictions                   PT Long Term Goals - 02/23/20 1626      PT LONG TERM GOAL #1   Title  Decrease pain in back and hip 25-50% allowing patient to perfrom normal functional activities with greater ease    Time  6    Period  Weeks    Status   On-going      PT LONG TERM GOAL #2   Title  Increase functional strength Rt LE to 4+/5 to 5-/5 throughout    Time  6    Period  Weeks    Status  Partially Met      PT LONG TERM GOAL #3   Title  Patient reports tolerating 10-15 min of walking on level surfaces 2-3 times/week to improve fitness level and ADL tolerance    Time  6    Period  Weeks    Status  On-going      PT LONG TERM GOAL #4   Title  Independent in HEP    Time  6    Period  Weeks    Status  On-going      PT LONG TERM GOAL #5   Title  Iprove FOTO to </= 35% limitation    Time  6    Period  Weeks    Status  On-going            Plan - 02/23/20 1541    Clinical Impression Statement  Pt reported legs feeling slightly fatigued with walking on treadmill.  Despite this, she demonstrated improved hip strength; has partially met LTG #2.  She tolerated all exercises well, without report of pain, only fatigue.    Personal Factors and Comorbidities  Comorbidity 1;Fitness    Comorbidities  pain; sedentary lifestyle    Examination-Activity Limitations  Locomotion Level;Carry;Sleep;Sit;Stand;Lift    Stability/Clinical Decision Making  Stable/Uncomplicated    Rehab Potential  Good    PT Frequency  2x / week    PT Duration  6 weeks    PT Treatment/Interventions  ADLs/Self Care Home Management;Cryotherapy;Electrical Stimulation;Iontophoresis 38m/ml Dexamethasone;Moist Heat;Traction;Ultrasound;Functional mobility training;Therapeutic activities;Therapeutic exercise;Neuromuscular re-education;Patient/family education;Manual techniques;Dry needling;Taping    PT Next Visit Plan  continue overall conditioning and spinal stabilization; progress HEP, including step ups.    PT Home Exercise Plan  [id number]   Consulted and Agree with Plan of Care  Patient  Patient will benefit from skilled therapeutic intervention in order to improve the following deficits and impairments:  Abnormal gait, Decreased range of motion, Decreased  activity tolerance, Pain, Decreased mobility, Decreased strength, Postural dysfunction  Visit Diagnosis: Chronic bilateral low back pain without sciatica  Other symptoms and signs involving the musculoskeletal system  Muscle weakness (generalized)  Pain in right hip     Problem List Patient Active Problem List   Diagnosis Date Noted  . Chronic right hip pain 01/11/2020  . Chronic right-sided low back pain without sciatica 01/11/2020  . Grief reaction 11/28/2019  . Toenail fungus 09/09/2019  . Vasovagal response 09/09/2019  . Decreased visual acuity 08/30/2018  . Right corneal abrasion 08/30/2018  . Elevated fasting glucose 04/08/2018  . Bipolar 1 disorder, depressed, severe ([place]) 01/25/2018  . Depression   . Anxiety 01/22/2018  . Borderline personality disorder ([place]) 01/22/2018  . Rheumatoid arthritis 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 ([place]) 12/09/2017  . TMJ (temporomandibular joint syndrome) 09/26/2017  . New daily persistent headache 09/26/2017  . MTHFR gene mutation ([place]) 09/20/2017  . Bipolar I disorder ([place]) 09/16/2017  . Fibromyalgia 09/16/2017  . Chronic fatigue syndrome 09/16/2017  . DYSPNEA 10/15/2011  . DERMATITIS, ALLERGIC 06/05/2008  . FATIGUE 05/05/2008  . Hypothyroidism 02/10/2008  . 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 DDD (degenerative disc disease), lumbar 02/10/2008  . FIBROMYALGIA 02/10/2008  . IRON, SERUM, ELEVATED 02/10/2008   Kerin Perna, PTA 02/23/20 4:26 PM  [place], [place], [address] Phone: [phone]   Fax:  [phone]  Name: Frances [name] MRN: [id number] Date of Birth: 1976/01/09

## 2020-02-27 ENCOUNTER — Encounter: Payer: Self-pay | Admitting: Rehabilitative and Restorative Service Providers"

## 2020-02-27 ENCOUNTER — Other Ambulatory Visit: Payer: Self-pay

## 2020-02-27 ENCOUNTER — Ambulatory Visit (INDEPENDENT_AMBULATORY_CARE_PROVIDER_SITE_OTHER): Payer: Self-pay | Admitting: Rehabilitative and Restorative Service Providers"

## 2020-02-27 DIAGNOSIS — M545 Low back pain, unspecified: Secondary | ICD-10-CM

## 2020-02-27 DIAGNOSIS — R29898 Other symptoms and signs involving the musculoskeletal system: Secondary | ICD-10-CM

## 2020-02-27 DIAGNOSIS — G8929 Other chronic pain: Secondary | ICD-10-CM

## 2020-02-27 DIAGNOSIS — M6281 Muscle weakness (generalized): Secondary | ICD-10-CM

## 2020-02-27 NOTE — Therapy (Addendum)
[place], [place], [address] Phone: [phone]   Fax:  [phone]  Physical Therapy Treatment  Patient Details  Name: Frances [name] MRN: [id number] Date of Birth: March 15, 1976 Referring Provider (PT): Dr Dianah Field   Encounter Date: 02/27/2020  PT End of Session - 02/27/20 1304    Visit Number  6    Number of Visits  12    Date for PT Re-Evaluation  03/23/20    PT Start Time  0933    PT Stop Time  1015    PT Time Calculation (min)  42 min    Activity Tolerance  Patient tolerated treatment well       Past Medical History:  Diagnosis Date  . Anxiety   . Arthritis   . Back pain, chronic   . Bipolar 1 disorder ([place])   . Chronic fatigue syndrome   . DDD (degenerative disc disease), lumbar   . Depression   . Fibromyalgia   . IUD 2012   Mirena  . MTHFR gene mutation [facility])     Past Surgical History:  Procedure Laterality Date  . COLONOSCOPY WITH PROPOFOL N/A 02/25/2017   Procedure: COLONOSCOPY WITH PROPOFOL;  Surgeon: Arta Silence, MD;  Location: [facility];  Service: Endoscopy;  Laterality: N/A;  . left knee lateral release  1993  . scar tisue removal  03/2005   left ankle  . TONSILLECTOMY  age 44's    There were no vitals filed for this visit.  Subjective Assessment - 02/27/20 0933    Subjective  The patient reports pain worse today rated a 4/10 (noted she is lenient) in low back since vacuuming on Friday.  The patient is doing HEP provided to this point.  She reports getting frustrated with how her back feels.    Pertinent History  arthritis; depression; allergies; unrelated surgeries; hip pain Rt > Lt; fibromyalgia    Diagnostic tests  MRI; Xray - DDD; HNP lumbar spine    Patient Stated Goals  would like to get rid of back    Currently in Pain?  Yes    Pain Score  4     Pain Location  Back    Pain Orientation  Lower    Pain Descriptors / Indicators  Sore    Pain Type   Chronic pain    Pain Onset  More than a month ago    Pain Frequency  Intermittent    Aggravating Factors   after vacuuming    Pain Relieving Factors  medicine                       [facility] Adult PT Treatment/Exercise - 02/27/20 0938      Exercises   Exercises  Lumbar      Lumbar Exercises: Stretches   Active Hamstring Stretch  Right;Left;1 rep;30 seconds    Active Hamstring Stretch Limitations  seated with leg on mat table with some discomfort mid thoracic spine    Lower Trunk Rotation  3 reps    Press Ups  5 reps    Press Ups Limitations  mobilization with movement    ITB Stretch  Right;Left;1 rep;30 seconds    ITB Stretch Limitations  supine using weight of one leg-- feels in low back    Piriformis Stretch  Right;Left;1 rep;30 seconds    Other Lumbar Stretch Exercise  pool noodle perpendicular to spine at mid to low thoracic region x 60 seconds supine  for extensor stretching      Lumbar Exercises: Aerobic   Tread Mill  2.2 mph x 5 minutes      Lumbar Exercises: Standing   Wall Slides  10 reps    Wall Slides Limitations  no back pain    Other Standing Lumbar Exercises  step ups 10 reps R and L leading to 12" step ; side stepping with UEs abducted x 20 feet x 2 reps    Other Standing Lumbar Exercises  Standing holding lunge with core engaged performing rotation towards anterior LE x 10 reps with core engagement.      Lumbar Exercises: Seated   Other Seated Lumbar Exercises  Physioball sitting with UE alternating movements for core activation, then marching x 10 reps R and L alteranting      Lumbar Exercises: Supine   Other Supine Lumbar Exercises  --      Lumbar Exercises: Prone   Straight Leg Raise  5 reps    Straight Leg Raises Limitations  cues to engage core    Other Prone Lumbar Exercises  Prone on elbows with mobiization into extension with movement      Lumbar Exercises: Quadruped   Madcat/Old Horse  5 reps    Madcat/Old Horse Limitations  tactile  cues for extension and for full motion                  PT Long Term Goals - 02/23/20 1626      PT LONG TERM GOAL #1   Title  Decrease pain in back and hip 25-50% allowing patient to perfrom normal functional activities with greater ease    Time  6    Period  Weeks    Status  On-going      PT LONG TERM GOAL #2   Title  Increase functional strength Rt LE to 4+/5 to 5-/5 throughout    Time  6    Period  Weeks    Status  Partially Met      PT LONG TERM GOAL #3   Title  Patient reports tolerating 10-15 min of walking on level surfaces 2-3 times/week to improve fitness level and ADL tolerance    Time  6    Period  Weeks    Status  On-going      PT LONG TERM GOAL #4   Title  Independent in HEP    Time  6    Period  Weeks    Status  On-going      PT LONG TERM GOAL #5   Title  Iprove FOTO to </= 35% limitation    Time  6    Period  Weeks    Status  On-going            Plan - 02/27/20 1305    Clinical Impression Statement  The patient had increased pain today noting she thinks vacuuming and "living life" flared up low back.  She is doing HEP regularly.    Personal Factors and Comorbidities  Comorbidity 1;Fitness    Comorbidities  pain; sedentary lifestyle    Examination-Activity Limitations  Locomotion Level;Carry;Sleep;Sit;Stand;Lift    Stability/Clinical Decision Making  Stable/Uncomplicated    Rehab Potential  Good    PT Frequency  2x / week    PT Duration  6 weeks    PT Treatment/Interventions  ADLs/Self Care Home Management;Cryotherapy;Electrical Stimulation;Iontophoresis 35m/ml Dexamethasone;Moist Heat;Traction;Ultrasound;Functional mobility training;Therapeutic activities;Therapeutic exercise;Neuromuscular re-education;Patient/family education;Manual techniques;Dry needling;Taping    PT Next Visit Plan  continue overall conditioning and spinal stabilization; progress HEP, including step ups.    PT Home Exercise Plan  [email or web address]    Consulted and Agree with  Plan of Care  Patient       Patient will benefit from skilled therapeutic intervention in order to improve the following deficits and impairments:  Abnormal gait, Decreased range of motion, Decreased activity tolerance, Pain, Decreased mobility, Decreased strength, Postural dysfunction  Visit Diagnosis: Chronic bilateral low back pain without sciatica  Other symptoms and signs involving the musculoskeletal system  Muscle weakness (generalized)     Problem List Patient Active Problem List   Diagnosis Date Noted  . Chronic right hip pain 01/11/2020  . Chronic right-sided low back pain without sciatica 01/11/2020  . Grief reaction 11/28/2019  . Toenail fungus 09/09/2019  . Vasovagal response 09/09/2019  . Decreased visual acuity 08/30/2018  . Right corneal abrasion 08/30/2018  . Elevated fasting glucose 04/08/2018  . Bipolar 1 disorder, depressed, severe ([place]) 01/25/2018  . Depression   . Anxiety 01/22/2018  . Borderline personality disorder ([place]) 01/22/2018  . Rheumatoid arthritis 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 ([place]) 12/09/2017  . TMJ (temporomandibular joint syndrome) 09/26/2017  . New daily persistent headache 09/26/2017  . MTHFR gene mutation ([place]) 09/20/2017  . Bipolar I disorder ([place]) 09/16/2017  . Fibromyalgia 09/16/2017  . Chronic fatigue syndrome 09/16/2017  . DYSPNEA 10/15/2011  . DERMATITIS, ALLERGIC 06/05/2008  . FATIGUE 05/05/2008  . Hypothyroidism 02/10/2008  . 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 DDD (degenerative disc disease), lumbar 02/10/2008  . FIBROMYALGIA 02/10/2008  . IRON, SERUM, ELEVATED 02/10/2008    Raileigh Sabater, PT 02/27/2020, 1:21 PM  [facility] [place], [place], [address] Phone: [phone]   Fax:  [phone]  Name: RAFAELITA FOISTER MRN: [id number] Date of Birth: May 27, 1976

## 2020-02-29 ENCOUNTER — Telehealth: Admit: 2020-02-29 | Discharge: 2020-03-01 | Attending: Sports Medicine | Primary: Sports Medicine

## 2020-02-29 DIAGNOSIS — M1611 Unilateral primary osteoarthritis, right hip: Principal | ICD-10-CM

## 2020-03-01 ENCOUNTER — Other Ambulatory Visit: Payer: Self-pay

## 2020-03-01 ENCOUNTER — Ambulatory Visit (INDEPENDENT_AMBULATORY_CARE_PROVIDER_SITE_OTHER): Payer: Self-pay | Admitting: Physical Therapy

## 2020-03-01 DIAGNOSIS — M6281 Muscle weakness (generalized): Secondary | ICD-10-CM

## 2020-03-01 DIAGNOSIS — G8929 Other chronic pain: Secondary | ICD-10-CM

## 2020-03-01 DIAGNOSIS — M545 Low back pain, unspecified: Secondary | ICD-10-CM

## 2020-03-01 DIAGNOSIS — R29898 Other symptoms and signs involving the musculoskeletal system: Secondary | ICD-10-CM

## 2020-03-01 NOTE — Therapy (Signed)
[place], [place], [address] Phone: [phone]   Fax:  [phone]  Physical Therapy Treatment  Patient Details  Name: Frances [name] MRN: [id number] Date of Birth: [birth year] Referring Provider (PT): Dr Dianah Field   Encounter Date: 03/01/2020  PT End of Session - 03/01/20 1232    Visit Number  7    Number of Visits  12    Date for PT Re-Evaluation  03/23/20    PT Start Time  7062    PT Stop Time  1228    PT Time Calculation (min)  45 min    Activity Tolerance  Patient tolerated treatment well;No increased pain    Behavior During Therapy  WFL for tasks assessed/performed       Past Medical History:  Diagnosis Date  . Anxiety   . Arthritis   . Back pain, chronic   . Bipolar 1 disorder ([place])   . Chronic fatigue syndrome   . DDD (degenerative disc disease), lumbar   . Depression   . Fibromyalgia   . IUD 2012   Mirena  . MTHFR gene mutation [facility])     Past Surgical History:  Procedure Laterality Date  . COLONOSCOPY WITH PROPOFOL N/A 02/25/2017   Procedure: COLONOSCOPY WITH PROPOFOL;  Surgeon: Arta Silence, MD;  [facility];  Service: Endoscopy;  Laterality: N/A;  . left knee lateral release  1993  . scar tisue removal  03/2005   left ankle  . TONSILLECTOMY  age 40's    There were no vitals filed for this visit.  Subjective Assessment - 03/01/20 1150    Subjective  Pt reports she can now do primal push up for 3-5 secs.  She has been walking her horse about 12 min through the pasture (grass).   She reports 25% improvement since starting therapy.    Pertinent History  arthritis; depression; allergies; unrelated surgeries; hip pain Rt > Lt; fibromyalgia    Diagnostic tests  MRI; Xray - DDD; HNP lumbar spine    Patient Stated Goals  would like to get rid of back    Currently in Pain?  No/denies    Pain Score  0-No pain    Pain Onset  More than a month ago          [facility] PT Assessment - 03/01/20 0001      Assessment   Medical Diagnosis  Lumbar DDD    Referring Provider (PT)  Dr Dianah Field    Onset Date/Surgical Date  01/06/20    Hand Dominance  Right    Next MD Visit  03/13/2020     Prior Therapy  yes here for LBP        [facility] Adult PT Treatment/Exercise - 03/01/20 0001      Lumbar Exercises: Stretches   Lower Trunk Rotation  3 reps    Prone on Elbows Stretch  3 reps   5 sec hold    Quad Stretch  Right;Left;2 reps;20 seconds   standing   Other Lumbar Stretch Exercise  childs pose x 4 reps (in between prone on elbow stretches) for increased mobility x 10 sec holds.  Open book x 5 reps each side.       Lumbar Exercises: Aerobic   Recumbent Bike  L1: 5 min       Lumbar Exercises: Standing   Other Standing Lumbar Exercises  forward step ups to 10.5 " step without UE support x 10 reps;  Lateral step  ups Rt/Lt on 10.5" step x 10 each leg    Other Standing Lumbar Exercises  opp arm/leg lifts (hip ext and raising overhead arm off of cabinet) x 10 reps each side.       Lumbar Exercises: Quadruped   Madcat/Old Horse  5 reps    Other Quadruped Lumbar Exercises  primal push up x 5 sec x 3 reps       Manual Therapy   Manual therapy comments  i strips of Reg skin rock tape applied to bilat lumbar paraspinals to increase proprioception and postural awareness.     Soft tissue mobilization  IASTM to bilat lumbar paraspinals to decrease fascial restrictions              PT Education - 03/01/20 1243    Education Details  progressed HEP    Person(s) Educated  Patient    Methods  Explanation;Handout    Comprehension  Returned demonstration;Verbalized understanding          PT Long Term Goals - 02/23/20 1626      PT LONG TERM GOAL #1   Title  Decrease pain in back and hip 25-50% allowing patient to perfrom normal functional activities with greater ease    Time  6    Period  Weeks    Status  On-going      PT LONG TERM GOAL #2    Title  Increase functional strength Rt LE to 4+/5 to 5-/5 throughout    Time  6    Period  Weeks    Status  Partially Met      PT LONG TERM GOAL #3   Title  Patient reports tolerating 10-15 min of walking on level surfaces 2-3 times/week to improve fitness level and ADL tolerance    Time  6    Period  Weeks    Status  On-going      PT LONG TERM GOAL #4   Title  Independent in HEP    Time  6    Period  Weeks    Status  On-going      PT LONG TERM GOAL #5   Title  Iprove FOTO to </= 35% limitation    Time  6    Period  Weeks    Status  On-going            Plan - 03/01/20 1200    Clinical Impression Statement  Pt progressing well towards LTG#1 with 25% improvement in back pain.  She reported improved tolerance for step ups after completing quad stretches.  Pt reported no increase in back pain with any of the exercises.    Rehab Potential  Good    PT Frequency  2x / week    PT Duration  6 weeks    PT Treatment/Interventions  ADLs/Self Care Home Management;Cryotherapy;Electrical Stimulation;Iontophoresis 7m/ml Dexamethasone;Moist Heat;Traction;Ultrasound;Functional mobility training;Therapeutic activities;Therapeutic exercise;Neuromuscular re-education;Patient/family education;Manual techniques;Dry needling;Taping    PT Next Visit Plan  continue overall conditioning and spinal stabilization; progress HEP as tolerated.    PT Home Exercise Plan  [email or web address]       Patient will benefit from skilled therapeutic intervention in order to improve the following deficits and impairments:  Abnormal gait, Decreased range of motion, Decreased activity tolerance, Pain, Decreased mobility, Decreased strength, Postural dysfunction  Visit Diagnosis: Chronic bilateral low back pain without sciatica  Other symptoms and signs involving the musculoskeletal system  Muscle weakness (generalized)     Problem List Patient Active Problem List  Diagnosis Date Noted  . Chronic right hip pain  01/11/2020  . Chronic right-sided low back pain without sciatica 01/11/2020  . Grief reaction 11/28/2019  . Toenail fungus 09/09/2019  . Vasovagal response 09/09/2019  . Decreased visual acuity 08/30/2018  . Right corneal abrasion 08/30/2018  . Elevated fasting glucose 04/08/2018  . Bipolar 1 disorder, depressed, severe ([place]) 01/25/2018  . Depression   . Anxiety 01/22/2018  . Borderline personality disorder ([place]) 01/22/2018  . Rheumatoid arthritis 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 ([place]) 12/09/2017  . TMJ (temporomandibular joint syndrome) 09/26/2017  . New daily persistent headache 09/26/2017  . MTHFR gene mutation ([place]) 09/20/2017  . Bipolar I disorder ([employer]) 09/16/2017  . Fibromyalgia 09/16/2017  . Chronic fatigue syndrome 09/16/2017  . DYSPNEA 10/15/2011  . DERMATITIS, ALLERGIC 06/05/2008  . FATIGUE 05/05/2008  . Hypothyroidism 02/10/2008  . 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 DDD (degenerative disc disease), lumbar 02/10/2008  . FIBROMYALGIA 02/10/2008  . IRON, SERUM, ELEVATED 02/10/2008   Kerin Perna, PTA 03/01/20 12:44 PM  [place], [place], [address] Phone: [phone]   Fax:  [phone]  Name: Frances [name] MRN: [id number] Date of Birth: 14-Jul-1976

## 2020-03-01 NOTE — Patient Instructions (Signed)
Access Code: [email or web address]  URL: [email or web address]  Date: 03/01/2020  Prepared by: Kerin Perna   Exercises  Quadriceps Stretch with Chair - 2 reps - 1 sets - 20 hold - 1x daily - 7x weekly  Step Up - 10 reps - 1 sets - 1x daily - 7x weekly  Lateral Step Ups - 10 reps - 1 sets - 1x daily - 7x weekly  Sit to Stand - 5-10 reps - 2 sets - 3-5 hold - 1x daily - 7x weekly  Standing Hip Extension at Wall - 10 reps - 1 sets - 1x daily - 7x weekly  Supine Bridge - 10 reps - 1 sets - 3-5 hold - 1x daily - 7x weekly  Primal Push Up - 5 reps - 1 sets - 5 hold - 1x daily - 3x weekly  Sidelying Hip Abduction - 5-10 reps - 2 sets - 1x daily - 7x weekly  Prone Press Up on Elbows - 5 reps - 1 sets - 5-10 hold - 1x daily - 7x weekly  Supine Piriformis Stretch - 2 reps - 1 sets - 20-30 hold - 2x daily - 7x weekly  Hooklying Hamstring Stretch with Strap - 3 reps - 1 sets - 30 seconds hold - 2x daily - 7x weekly

## 2020-03-05 ENCOUNTER — Other Ambulatory Visit: Payer: Self-pay

## 2020-03-05 ENCOUNTER — Ambulatory Visit (INDEPENDENT_AMBULATORY_CARE_PROVIDER_SITE_OTHER): Payer: Self-pay | Admitting: Physical Therapy

## 2020-03-05 ENCOUNTER — Encounter: Payer: Self-pay | Admitting: Physical Therapy

## 2020-03-05 DIAGNOSIS — M6281 Muscle weakness (generalized): Secondary | ICD-10-CM

## 2020-03-05 DIAGNOSIS — M545 Low back pain, unspecified: Secondary | ICD-10-CM

## 2020-03-05 DIAGNOSIS — G8929 Other chronic pain: Secondary | ICD-10-CM

## 2020-03-05 DIAGNOSIS — R29898 Other symptoms and signs involving the musculoskeletal system: Secondary | ICD-10-CM

## 2020-03-05 NOTE — Therapy (Signed)
[place], [place], [address] Phone: [phone]   Fax:  [phone]  Physical Therapy Treatment  Patient Details  Name: Frances [name] MRN: [id number] Date of Birth: [birth year] Referring Provider (PT): Dr Dianah Field   Encounter Date: 03/05/2020  PT End of Session - 03/05/20 1117    Visit Number  8    Number of Visits  12    Date for PT Re-Evaluation  03/23/20    PT Start Time  1018    PT Stop Time  1100    PT Time Calculation (min)  42 min    Activity Tolerance  Patient tolerated treatment well    Behavior During Therapy  [facility] for tasks assessed/performed       Past Medical History:  Diagnosis Date  . Anxiety   . Arthritis   . Back pain, chronic   . Bipolar 1 disorder ([place])   . Chronic fatigue syndrome   . DDD (degenerative disc disease), lumbar   . Depression   . Fibromyalgia   . IUD 2012   Mirena  . MTHFR gene mutation [facility])     Past Surgical History:  Procedure Laterality Date  . COLONOSCOPY WITH PROPOFOL N/A 02/25/2017   Procedure: COLONOSCOPY WITH PROPOFOL;  Surgeon: Arta Silence, MD;  [facility];  Service: Endoscopy;  Laterality: N/A;  . left knee lateral release  1993  . scar tisue removal  03/2005   left ankle  . TONSILLECTOMY  age 44's    There were no vitals filed for this visit.  Subjective Assessment - 03/05/20 1022    Subjective  Pt reports she was able to get onto her horse from the ground this weekend, "it didn't look pretty, but I was able to do it".  She thinks the tape may have helped her low back, but unsure.    Pertinent History  arthritis; depression; allergies; unrelated surgeries; hip pain Rt > Lt; fibromyalgia    Patient Stated Goals  would like to get rid of back    Currently in Pain?  No/denies    Pain Score  0-No pain    Aggravating Factors   driving in car, vacuuming    Pain Relieving Factors  ?         [facility] PT Assessment -  03/05/20 0001      Assessment   Medical Diagnosis  Lumbar DDD    Referring Provider (PT)  Dr Dianah Field    Onset Date/Surgical Date  01/06/20    Hand Dominance  Right    Next MD Visit  03/13/2020     Prior Therapy  yes here for LBP       Strength   Right Hip Extension  4+/5    Left Hip Extension  4-/5       [facility] Adult PT Treatment/Exercise - 03/05/20 0001      Lumbar Exercises: Stretches   Passive Hamstring Stretch  Right;Left;2 reps;20 seconds    Lower Trunk Rotation  3 reps    Hip Flexor Stretch  Right;Left;2 reps;20 seconds   seated     Lumbar Exercises: Aerobic   Tread Mill  2.0-2.2 mph x 6 min     Recumbent Bike  L1: 2 min (began c/o increased LBP-switched to treadmill)      Lumbar Exercises: Machines for Strengthening   Other Lumbar Machine Exercise  Lat pull down with 2 plates x 10 reps      Lumbar Exercises: Standing  Wall Slides  5 reps;4 seconds    Other Standing Lumbar Exercises  forward step ups onto 12" step without UE support     Other Standing Lumbar Exercises  Standing holding lunge with core engaged performing rotation towards anterior LE x 10 reps with 5# with core engagement.      Lumbar Exercises: Supine   Bridge  5 reps;2 seconds   2 sets, legs on green ball, arms at side.    Other Supine Lumbar Exercises  ball pass from hands to/from feet (with green pball) - with overhead reach and ball touching down at feet x 10       Lumbar Exercises: Sidelying   Other Sidelying Lumbar Exercises  thoracic rotation -open book x 5 each side.       Lumbar Exercises: Prone   Straight Leg Raise  10 reps      Lumbar Exercises: Quadruped   Straight Leg Raise  5 reps   over green ball   Opposite Arm/Leg Raise  Right arm/Left leg;Left arm/Right leg;5 reps   over green ball    Other Quadruped Lumbar Exercises  primal push up x 10 sec x 2 reps     Other Quadruped Lumbar Exercises  childs pose x 2 reps of 15 sec.  Thread the needle x 3 reps each side (limited range  each direction)      Manual Therapy   Manual therapy comments  i strips of Reg skin rock tape applied to bilat lumbar paraspinals to increase proprioception and postural awareness.     Soft tissue mobilization  IASTM to bilat lumbar paraspinals to decrease fascial restrictions        PT Long Term Goals - 03/05/20 1024      PT LONG TERM GOAL #1   Title  Decrease pain in back and hip 25-50% allowing patient to perfrom normal functional activities with greater ease    Baseline  25% improvment in LBP, no improvement in hip pain.    Time  6    Period  Weeks    Status  Partially Met      PT LONG TERM GOAL #2   Title  Increase functional strength Rt LE to 4+/5 to 5-/5 throughout    Time  6    Period  Weeks    Status  Partially Met      PT LONG TERM GOAL #3   Title  Patient reports tolerating 10-15 min of walking on level surfaces 2-3 times/week to improve fitness level and ADL tolerance    Time  6    Period  Weeks    Status  On-going      PT LONG TERM GOAL #4   Title  Independent in HEP    Time  6    Period  Weeks    Status  On-going      PT LONG TERM GOAL #5   Title  Iprove FOTO to </= 35% limitation    Time  6    Period  Weeks    Status  On-going            Plan - 03/05/20 1112    Clinical Impression Statement  Pt demonstrates some stiffness and limited ROM in thoracic spine with rotational stretches. She reported some pain after completing small sustained lunge with trunk rotation (holding 5# at core);  reduced with rest and stretches. Pt able to tolerate increased reps of exercises without difficulty. Overall, pt demonstrating improved hip strength each  visit.  Also pt reporting enough strength to mount horse without report of hip/LBP.  Progressing well towards goals.  FOTO.    Comorbidities  pain; sedentary lifestyle    Rehab Potential  Good    PT Frequency  2x / week    PT Duration  6 weeks    PT Treatment/Interventions  ADLs/Self Care Home  Management;Cryotherapy;Electrical Stimulation;Iontophoresis 49m/ml Dexamethasone;Moist Heat;Traction;Ultrasound;Functional mobility training;Therapeutic activities;Therapeutic exercise;Neuromuscular re-education;Patient/family education;Manual techniques;Dry needling;Taping    PT Next Visit Plan  assess goals and readiness to d/c, hold, or continue therapy. Update HEP    PT Home Exercise Plan  [id number]    Consulted and Agree with Plan of Care  Patient       Patient will benefit from skilled therapeutic intervention in order to improve the following deficits and impairments:  Abnormal gait, Decreased range of motion, Decreased activity tolerance, Pain, Decreased mobility, Decreased strength, Postural dysfunction  Visit Diagnosis: Chronic bilateral low back pain without sciatica  Other symptoms and signs involving the musculoskeletal system  Muscle weakness (generalized)     Problem List Patient Active Problem List   Diagnosis Date Noted  . Chronic right hip pain 01/11/2020  . Chronic right-sided low back pain without sciatica 01/11/2020  . Grief reaction 11/28/2019  . Toenail fungus 09/09/2019  . Vasovagal response 09/09/2019  . Decreased visual acuity 08/30/2018  . Right corneal abrasion 08/30/2018  . Elevated fasting glucose 04/08/2018  . Bipolar 1 disorder, depressed, severe ([place] 01/25/2018  . Depression   . Anxiety 01/22/2018  . Borderline personality disorder ([place] 01/22/2018  . Rheumatoid arthritis 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 ([facility] 12/09/2017  . TMJ (temporomandibular joint syndrome) 09/26/2017  . New daily persistent headache 09/26/2017  . MTHFR gene mutation ([facility] 09/20/2017  . Bipolar I disorder ([facility] 09/16/2017  . Fibromyalgia 09/16/2017  . Chronic fatigue syndrome 09/16/2017  . DYSPNEA 10/15/2011  . DERMATITIS, ALLERGIC 06/05/2008  . FATIGUE 05/05/2008  . Hypothyroidism 02/10/2008  . 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 DDD  (degenerative disc disease), lumbar 02/10/2008  . FIBROMYALGIA 02/10/2008  . IRON, SERUM, ELEVATED 02/10/2008   JKerin Perna PTA 03/05/20 11:21 AM  [email or web address] [address]Phone: [phone]  Fax:  [phone] Name: PLINDZY RUPERTMRN: [id number] of Birth: [birth year]

## 2020-03-08 ENCOUNTER — Other Ambulatory Visit: Payer: Self-pay

## 2020-03-08 ENCOUNTER — Ambulatory Visit (INDEPENDENT_AMBULATORY_CARE_PROVIDER_SITE_OTHER): Payer: Self-pay | Admitting: Physical Therapy

## 2020-03-08 ENCOUNTER — Encounter: Payer: Self-pay | Admitting: Physical Therapy

## 2020-03-08 DIAGNOSIS — M545 Low back pain, unspecified: Secondary | ICD-10-CM

## 2020-03-08 DIAGNOSIS — M6281 Muscle weakness (generalized): Secondary | ICD-10-CM

## 2020-03-08 DIAGNOSIS — R29898 Other symptoms and signs involving the musculoskeletal system: Secondary | ICD-10-CM

## 2020-03-08 DIAGNOSIS — G8929 Other chronic pain: Secondary | ICD-10-CM

## 2020-03-08 NOTE — Therapy (Addendum)
[address] [place], [place], [address] Phone: [phone]   Fax:  [phone]  Physical Therapy Treatment  Patient Details  Name: Frances [name] MRN: [id number] Date of Birth: 10/15/1976 Referring Provider (PT): Dr Dianah Field   Encounter Date: 03/08/2020  PT End of Session - 03/08/20 0949    Visit Number  9    Number of Visits  12    Date for PT Re-Evaluation  03/23/20    PT Start Time  0928    PT Stop Time  1008    PT Time Calculation (min)  40 min    Activity Tolerance  Patient tolerated treatment well    Behavior During Therapy  [facility] for tasks assessed/performed       Past Medical History:  Diagnosis Date  . Anxiety   . Arthritis   . Back pain, chronic   . Bipolar 1 disorder ([place])   . Chronic fatigue syndrome   . DDD (degenerative disc disease), lumbar   . Depression   . Fibromyalgia   . IUD 2012   Mirena  . MTHFR gene mutation [facility])     Past Surgical History:  Procedure Laterality Date  . COLONOSCOPY WITH PROPOFOL N/A 02/25/2017   Procedure: COLONOSCOPY WITH PROPOFOL;  Surgeon: Arta Silence, MD;  Location: [facility] END[facility]COPY;  Service: Endoscopy;  Laterality: N/A;  . left knee lateral release  1993  . scar tisue removal  03/2005   left ankle  . TONSILLECTOMY  age 61's    There were no vitals filed for this visit.  Subjective Assessment - 03/08/20 0929    Subjective  Pt reports she is stiff and sore today. She has a spot in her Lt low back that is irritated, if she moves a certain way she has pain 5/10.  She walked yesterday for 15 minutes without difficulty.    Diagnostic tests  MRI; Xray - DDD; HNP lumbar spine    Patient Stated Goals  would like to get rid of back    Currently in Pain?  No/denies   no pain at rest.        [facility] PT Assessment - 03/08/20 0001      Assessment   Medical Diagnosis  Lumbar DDD    Referring Provider (PT)  Dr Dianah Field    Onset Date/Surgical  Date  01/06/20    Hand Dominance  Right    Next MD Visit  03/13/2020     Prior Therapy  yes here for LBP       Observation/Other Assessments   Focus on Therapeutic Outcomes (FOTO)   48% limitation        [facility] Adult PT Treatment/Exercise - 03/08/20 0001      Lumbar Exercises: Stretches   Quad Stretch  Right;Left;20 seconds;3 reps   standing   Piriformis Stretch  Right;Left;2 reps;30 seconds      Lumbar Exercises: Aerobic   Tread Mill  2.0-2.2 mph x 6 min       Lumbar Exercises: Machines for Strengthening   Other Lumbar Machine Exercise  Lat pull down with 2 plates x 10 reps x 2 sets      Lumbar Exercises: Standing   Other Standing Lumbar Exercises  forward step ups onto 12" step without UE support x 6 reps each leg; lateral step ups on 12" stair x 5 reps each side.       Lumbar Exercises: Seated   Sit to Stand  10 reps  Lumbar Exercises: Sidelying   Hip Abduction  Right;Left;10 reps   2nd set, with 3 pulses x 5 reps    Other Sidelying Lumbar Exercises  thoracic rotation -open book x 8 each side with red band.       Lumbar Exercises: Prone   Straight Leg Raise  10 reps      Lumbar Exercises: Quadruped   Other Quadruped Lumbar Exercises  primal push up x 10 sec x  3 reps     Other Quadruped Lumbar Exercises  childs pose x 2 reps of 15 sec, repeated with lateral trunk flexion each side.       Manual Therapy   Manual therapy comments  i strips of Reg skin rock tape applied to Lt lumbar paraspinals to increase proprioception and postural awareness.                   PT Long Term Goals - 03/08/20 0946      PT LONG TERM GOAL #1   Title  Decrease pain in back and hip 25-50% allowing patient to perfrom normal functional activities with greater ease    Baseline  25% improvment in LBP, no improvement in hip pain.    Time  6    Period  Weeks    Status  Partially Met      PT LONG TERM GOAL #2   Title  Increase functional strength Rt LE to 4+/5 to 5-/5  throughout    Time  6    Period  Weeks    Status  Partially Met      PT LONG TERM GOAL #3   Title  Patient reports tolerating 10-15 min of walking on level surfaces 2-3 times/week to improve fitness level and ADL tolerance    Time  6    Period  Weeks    Status  Partially Met      PT LONG TERM GOAL #4   Title  Independent in HEP    Time  6    Period  Weeks    Status  Achieved      PT LONG TERM GOAL #5   Title  Improve FOTO to </= 35% limitation    Time  6    Period  Weeks    Status  On-going            Plan - 03/08/20 0935    Clinical Impression Statement  Pt's overall back pain has reduced some since initiation of therapy; she is pleased with her increase in strength.  Her exercise tolerance has improved; able to tolerate 10-15 reps of exercises prior to fatigue and no complaint of increased pain. (Completed 5 reps prior to fatigue at initiation of therapy). Pt has partially met her goals and is agreeable to hold therapy until MD appt.    Comorbidities  pain; sedentary lifestyle    Rehab Potential  Good    PT Frequency  2x / week    PT Duration  6 weeks    PT Treatment/Interventions  ADLs/Self Care Home Management;Cryotherapy;Electrical Stimulation;Iontophoresis 48m/ml Dexamethasone;Moist Heat;Traction;Ultrasound;Functional mobility training;Therapeutic activities;Therapeutic exercise;Neuromuscular re-education;Patient/family education;Manual techniques;Dry needling;Taping    PT Next Visit Plan  will hold until upcoming MD appt.    PT Home Exercise Plan  [id number]    Consulted and Agree with Plan of Care  Patient       Patient will benefit from skilled therapeutic intervention in order to improve the following deficits and impairments:  Abnormal gait, Decreased range of  motion, Decreased activity tolerance, Pain, Decreased mobility, Decreased strength, Postural dysfunction  Visit Diagnosis: Chronic bilateral low back pain without sciatica  Other symptoms and signs  involving the musculoskeletal system  Muscle weakness (generalized)     Problem List Patient Active Problem List   Diagnosis Date Noted  . Chronic right hip pain 01/11/2020  . Chronic right-sided low back pain without sciatica 01/11/2020  . Grief reaction 11/28/2019  . Toenail fungus 09/09/2019  . Vasovagal response 09/09/2019  . Decreased visual acuity 08/30/2018  . Right corneal abrasion 08/30/2018  . Elevated fasting glucose 04/08/2018  . Bipolar 1 disorder, depressed, severe ([place]) 01/25/2018  . Depression   . Anxiety 01/22/2018  . Borderline personality disorder ([place]) 01/22/2018  . Rheumatoid arthritis 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 ([place]) 12/09/2017  . TMJ (temporomandibular joint syndrome) 09/26/2017  . New daily persistent headache 09/26/2017  . MTHFR gene mutation ([place]) 09/20/2017  . Bipolar I disorder ([place]) 09/16/2017  . Fibromyalgia 09/16/2017  . Chronic fatigue syndrome 09/16/2017  . DYSPNEA 10/15/2011  . DERMATITIS, ALLERGIC 06/05/2008  . FATIGUE 05/05/2008  . Hypothyroidism 02/10/2008  . 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 DDD (degenerative disc disease), lumbar 02/10/2008  . FIBROMYALGIA 02/10/2008  . IRON, SERUM, ELEVATED 02/10/2008   Kerin Perna, PTA 03/08/20 10:17 AM  [facility] [place] [address] [place], [place], [address] Phone: [phone]   Fax:  [phone]  Name: MARYKATHERINE SHERWOOD MRN: [id number] Date of Birth: [birth year]  PHYSICAL THERAPY DISCHARGE SUMMARY  Visits from Start of Care: 9  Current functional level related to goals / functional outcomes: See progress note for discharge status    Remaining deficits: Needs to continue with consistent exercise    Education / Equipment: HEP  Plan: Patient agrees to discharge.  Patient goals were met. Patient is being discharged due to meeting the stated rehab goals.  ?????    Celyn P. Helene Kelp PT,  MPH 04/20/20 8:18 AM

## 2020-03-13 ENCOUNTER — Ambulatory Visit (INDEPENDENT_AMBULATORY_CARE_PROVIDER_SITE_OTHER): Payer: Self-pay | Admitting: Sports Medicine

## 2020-03-13 ENCOUNTER — Ambulatory Visit (INDEPENDENT_AMBULATORY_CARE_PROVIDER_SITE_OTHER): Payer: Self-pay | Admitting: Physician Assistant

## 2020-03-13 ENCOUNTER — Other Ambulatory Visit: Payer: Self-pay

## 2020-03-13 ENCOUNTER — Ambulatory Visit (INDEPENDENT_AMBULATORY_CARE_PROVIDER_SITE_OTHER): Payer: Self-pay

## 2020-03-13 ENCOUNTER — Encounter: Payer: Self-pay | Admitting: Sports Medicine

## 2020-03-13 VITALS — BP 129/88 | HR 104 | Ht 67.0 in | Wt 181.0 lb

## 2020-03-13 DIAGNOSIS — M51369 Other intervertebral disc degeneration, lumbar region without mention of lumbar back pain or lower extremity pain: Secondary | ICD-10-CM

## 2020-03-13 DIAGNOSIS — M5030 Other cervical disc degeneration, unspecified cervical region: Secondary | ICD-10-CM

## 2020-03-13 DIAGNOSIS — M5136 Other intervertebral disc degeneration, lumbar region: Secondary | ICD-10-CM

## 2020-03-13 DIAGNOSIS — L600 Ingrowing nail: Secondary | ICD-10-CM | POA: Insufficient documentation

## 2020-03-13 NOTE — Assessment & Plan Note (Signed)
Frances [name] has known C6-C7 DDD, she is having worsening of pain, this time on the right side. She has already been through therapy, oral medications in the past, proceeding with repeat x-ray and updated cervical spine MRI for interventional planning.

## 2020-03-13 NOTE — Patient Instructions (Signed)
Ingrown Toenail An ingrown toenail occurs when the corner or sides of a toenail grow into the surrounding skin. This causes discomfort and pain. The big toe is most commonly affected, but any of the toes can be affected. If an ingrown toenail is not treated, it can become infected. What are the causes? This condition may be caused by:  Wearing shoes that are too small or tight.  An injury, such as stubbing your toe or having your toe stepped on.  Improper cutting or care of your toenails.  Having nail or foot abnormalities that were present from birth (congenital abnormalities), such as having a nail that is too big for your toe. What increases the risk? The following factors may make you more likely to develop ingrown toenails:  Age. Nails tend to get thicker with age, so ingrown nails are more common among older people.  Cutting your toenails incorrectly, such as cutting them very short or cutting them unevenly. An ingrown toenail is more likely to get infected if you have:  Diabetes.  Blood flow (circulation) problems. What are the signs or symptoms? Symptoms of an ingrown toenail may include:  Pain, soreness, or tenderness.  Redness.  Swelling.  Hardening of the skin that surrounds the toenail. Signs that an ingrown toenail may be infected include:  Fluid or pus.  Symptoms that get worse instead of better. How is this diagnosed? An ingrown toenail may be diagnosed based on your medical history, your symptoms, and a physical exam. If you have fluid or blood coming from your toenail, a sample may be collected to test for the specific type of bacteria that is causing the infection. How is this treated? Treatment depends on how severe your ingrown toenail is. You may be able to care for your toenail at home.  If you have an infection, you may be prescribed antibiotic medicines.  If you have fluid or pus draining from your toenail, your health care provider may drain  it.  If you have trouble walking, you may be given crutches to use.  If you have a severe or infected ingrown toenail, you may need a procedure to remove part or all of the nail. Follow these instructions at home: Foot care   Do not pick at your toenail or try to remove it yourself.  Soak your foot in warm, soapy water. Do this for 20 minutes, 3 times a day, or as often as told by your health care provider. This helps to keep your toe clean and keep your skin soft.  Wear shoes that fit well and are not too tight. Your health care provider may recommend that you wear open-toed shoes while you heal.  Trim your toenails regularly and carefully. Cut your toenails straight across to prevent injury to the skin at the corners of the toenail. Do not cut your nails in a curved shape.  Keep your feet clean and dry to help prevent infection. Medicines  Take over-the-counter and prescription medicines only as told by your health care provider.  If you were prescribed an antibiotic, take it as told by your health care provider. Do not stop taking the antibiotic even if you start to feel better. Activity  Return to your normal activities as told by your health care provider. Ask your health care provider what activities are safe for you.  Avoid activities that cause pain. General instructions  If your health care provider told you to use crutches to help you move around, use them   as instructed.  Keep all follow-up visits as told by your health care provider. This is important. Contact a health care provider if:  You have more redness, swelling, pain, or other symptoms that do not improve with treatment.  You have fluid, blood, or pus coming from your toenail. Get help right away if:  You have a red streak on your skin that starts at your foot and spreads up your leg.  You have a fever. Summary  An ingrown toenail occurs when the corner or sides of a toenail grow into the surrounding  skin. This causes discomfort and pain. The big toe is most commonly affected, but any of the toes can be affected.  If an ingrown toenail is not treated, it can become infected.  Fluid or pus draining from your toenail is a sign of infection. Your health care provider may need to drain it. You may be given antibiotics to treat the infection.  Trimming your toenails regularly and properly can help you prevent an ingrown toenail. This information is not intended to replace advice given to you by your health care provider. Make sure you discuss any questions you have with your health care provider. Document Revised: 04/01/2019 Document Reviewed: 08/26/2017 Elsevier Patient Education  2020 Elsevier Inc.  

## 2020-03-13 NOTE — Progress Notes (Signed)
   Subjective:    Patient ID: Frances [name], female    DOB: October 06, 1976, 44 y.o.   MRN: [id number]  HPI Pt is a 44 yo female who presents to the clinic to recheck left great toenail. She has been struggling with toenail fungus for the last 8 months. She had to have full toenail removed due to being so brittle/diseased. She just finished lamisil and seemed to help a lot with new growth. She now has some pain in her left great toe medial side where she is concerned about ingrown toenail forming. Pain is minimal and intermittent at this time. No redness or swelling. Not done anything to make better. No trauma.   .. Active Ambulatory Problems    Diagnosis Date Noted  . Hypothyroidism 02/10/2008  . 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 DERMATITIS, ALLERGIC 06/05/2008  . DDD (degenerative disc disease), lumbar 02/10/2008  . FIBROMYALGIA 02/10/2008  . FATIGUE 05/05/2008  . IRON, SERUM, ELEVATED 02/10/2008  . DYSPNEA 10/15/2011  . Bipolar I disorder ([place]) 09/16/2017  . Fibromyalgia 09/16/2017  . Chronic fatigue syndrome 09/16/2017  . MTHFR gene mutation ([place]) 09/20/2017  . TMJ (temporomandibular joint syndrome) 09/26/2017  . New daily persistent headache 09/26/2017  . Rheumatoid arthritis 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 ([place]) 12/09/2017  . Anxiety 01/22/2018  . Borderline personality disorder ([place]) 01/22/2018  . Depression   . Bipolar 1 disorder, depressed, severe ([place]) 01/25/2018  . Elevated fasting glucose 04/08/2018  . Decreased visual acuity 08/30/2018  . Right corneal abrasion 08/30/2018  . Toenail fungus 09/09/2019  . Vasovagal response 09/09/2019  . Grief reaction 11/28/2019  . Chronic right hip pain 01/11/2020  . DDD (degenerative disc disease), cervical 03/13/2020  . Ingrowing left great toenail 03/13/2020   Resolved Ambulatory Problems    Diagnosis Date Noted  . FEVER, RECURRENT 04/02/2009  . SINUSITIS- ACUTE-NOS 03/24/2008  . URI 01/17/2009  .  ANKLE PAIN, BILATERAL 07/28/2008  . BACK PAIN 10/13/2008  . OPEN WOUND FT NO TOE ALONE WITHOUT MENTION COMP 03/28/2009  . Chronic right-sided low back pain without sciatica 01/11/2020   Past Medical History:  Diagnosis Date  . Arthritis   . Back pain, chronic   . Bipolar 1 disorder ([place])   . IUD 2012      Review of Systems See HPI.     Objective:   Physical Exam Musculoskeletal:     Comments: Left great medial nail edge with no redness, warmth, swelling. It is tender to palpate. It looks like edge of nail could be ingrowing.  There is dark burned appearance to nail where was burned after full toenail removal.            Assessment & Plan:  Marland KitchenMarland KitchenNajiyah was seen today for nail problem.  Diagnoses and all orders for this visit:  Ingrowing left great toenail   Pt has had full toenail removal about 6 months ago. Good healthy nail growing from base. You can see the burned nail edge from phenol used. No signs of infection or overt ingrown toenail of the medial edge where patient is having pain. I suggested epson water soaks and showed her how to lift the nail after every soak. I think just pushing it to grow might help a lot. If not improving call and will send to podiatry.

## 2020-03-13 NOTE — Progress Notes (Signed)
    Procedures performed today:    None.  Independent interpretation of notes and tests performed by another provider:   I personally reviewed her cervical spine MRI from 2018 which shows extremely mild cervical DDD at C6-C7, no central or neuroforaminal stenosis.  Impression and Recommendations:    DDD (degenerative disc disease), lumbar [name] returns, she has had over 6 weeks of formal physical therapy, oral medications including NSAIDs. She has continued to have axial low back pain, we are going to proceed with lumbar spine MRI for interventional planning. She will return to see me to go over MRI results.  DDD (degenerative disc disease), cervical [name] has known C6-C7 DDD, she is having worsening of pain, this time on the right side. She has already been through therapy, oral medications in the past, proceeding with repeat x-ray and updated cervical spine MRI for interventional planning.    ___________________________________________ Gwen Her. Dianah Field, M.D., ABFM., CAQSM. Primary Care and [place] Instructor of [employer] [facility] [employer]

## 2020-03-13 NOTE — Assessment & Plan Note (Signed)
[name] returns, she has had over 6 weeks of formal physical therapy, oral medications including NSAIDs. She has continued to have axial low back pain, we are going to proceed with lumbar spine MRI for interventional planning. She will return to see me to go over MRI results.

## 2020-03-14 ENCOUNTER — Encounter: Payer: Self-pay | Admitting: Physician Assistant

## 2020-03-14 ENCOUNTER — Encounter: Payer: Self-pay | Admitting: Physical Medicine and Rehabilitation

## 2020-03-14 DIAGNOSIS — L600 Ingrowing nail: Secondary | ICD-10-CM

## 2020-03-14 MED ORDER — DOXYCYCLINE HYCLATE 100 MG PO TABS: 100.0000 mg | ORAL_TABLET | Freq: Two times a day (BID) | ORAL | 0 refills | Status: DC

## 2020-03-24 ENCOUNTER — Ambulatory Visit (HOSPITAL_BASED_OUTPATIENT_CLINIC_OR_DEPARTMENT_OTHER)
Admission: RE | Admit: 2020-03-24 | Discharge: 2020-03-24 | Disposition: A | Discharge status: Home / Self Care | Payer: Self-pay | Source: Ambulatory Visit | Attending: Sports Medicine | Admitting: Sports Medicine

## 2020-03-24 ENCOUNTER — Other Ambulatory Visit: Payer: Self-pay

## 2020-03-24 DIAGNOSIS — M5136 Other intervertebral disc degeneration, lumbar region: Secondary | ICD-10-CM | POA: Insufficient documentation

## 2020-03-24 DIAGNOSIS — M5030 Other cervical disc degeneration, unspecified cervical region: Secondary | ICD-10-CM | POA: Insufficient documentation

## 2020-03-26 MED FILL — HYDROXYCHLOROQUINE 200 MG TABLET: ORAL | 30 days supply | Qty: 60 | Fill #4

## 2020-03-26 MED FILL — DULOXETINE 30 MG CAPSULE,DELAYED RELEASE: ORAL | 30 days supply | Qty: 60 | Fill #2

## 2020-03-26 MED FILL — HYDROXYCHLOROQUINE 200 MG TABLET: 30 days supply | Qty: 60 | Fill #4 | Status: AC

## 2020-03-26 MED FILL — DULOXETINE 30 MG CAPSULE,DELAYED RELEASE: 30 days supply | Qty: 60 | Fill #2 | Status: AC

## 2020-03-27 ENCOUNTER — Encounter: Payer: Self-pay | Admitting: Physical Medicine and Rehabilitation

## 2020-03-27 ENCOUNTER — Other Ambulatory Visit: Payer: Self-pay

## 2020-03-27 ENCOUNTER — Encounter: Payer: Self-pay | Attending: Physical Medicine and Rehabilitation | Admitting: Physical Medicine and Rehabilitation

## 2020-03-27 VITALS — BP 125/86 | HR 98 | Temp 97.7°F | Ht 67.0 in | Wt 183.0 lb

## 2020-03-27 DIAGNOSIS — G8929 Other chronic pain: Secondary | ICD-10-CM | POA: Insufficient documentation

## 2020-03-27 DIAGNOSIS — S73191A Other sprain of right hip, initial encounter: Secondary | ICD-10-CM | POA: Insufficient documentation

## 2020-03-27 DIAGNOSIS — M25551 Pain in right hip: Secondary | ICD-10-CM | POA: Insufficient documentation

## 2020-03-27 DIAGNOSIS — M1611 Unilateral primary osteoarthritis, right hip: Secondary | ICD-10-CM | POA: Insufficient documentation

## 2020-03-27 NOTE — Progress Notes (Signed)
Subjective:    Patient ID: Frances [name], female    DOB: Jun 23, 1976, 44 y.o.   MRN: [id number]  HPI  Frances [name] is a 44 year old woman who presents to establish care for right sided hip osteoarthritis and labral tear.   She has 4/10 pain (currently 2/10) radiating into her right groin. It is worse with hip flexion, constant, though not significantly bothersome to her. She has seen multiple orthopedic specialists and has tried Tylenol, Mobic, Gabapentin, Lyrica, steroid injection, and hyaluronic acid injection. Of these she only had relief from the Mobic, which she took for a year. She switched off of it as she was growing tolerant to it. She never developed gastritis on it. She was not able to tolerate Lyrica or Gabapentin as they made her "crazy." She does feel she may be able to tolerate them better now as her mood has stabilized on Abilify. She does still have depression, anxiety, and suicidal thoughts.   She has been given the diagnoses of right hip OA, right hip labral tear, and right hip iliopsoas tendonitis. She has done PT, stretching and exercise based, without relief. She was told that she may need surgery in 5 years.    She is able to walk 10-15 minutes and usually does not feel pain while walking. Her walking is more limited by fatigue secondary to her fibromyalgia.   She asks whether there are any other injections that may be helpful for her hip.   Pain Inventory Average Pain 4 Pain Right Now 2 My pain is intermittent, sharp and dull  In the last 24 hours, has pain interfered with the following? General activity 2 Relation with others 0 Enjoyment of life 4 What TIME of day is your pain at its worst? daytime Sleep (in general) Good  Pain is worse with: walking, standing and some activites Pain improves with: rest Relief from Meds: 2  Mobility walk without assistance ability to climb steps?  yes do you drive?  yes  Function not employed: date last employed  . Do you have any goals in this area?  no  Neuro/Psych weakness dizziness depression anxiety suicidal thoughts - no active plans   Prior Studies New  Physicians involved in your care New   Family History  Problem Relation Age of Onset  . Diabetes Mother   . Stroke Mother   . Lupus Mother   . Hypertension Mother   . Congestive Heart Failure Mother   . Mental illness Brother        Not clear what his diagnosis is--may be related to previous drug use.  . Cancer Maternal Grandmother        colon  . Depression Maternal Uncle   . Alcohol abuse Maternal Uncle   . Diabetes Maternal Grandfather    Social History   Socioeconomic History  . Marital status: Single    Spouse name: Not on file  . Number of children: 0  . Years of education: Not on file  . Highest education level: Bachelor's degree (e.g., BA, AB, BS)  Occupational History  . Occupation: Designer, industrial/product at times  Tobacco Use  . Smoking status: Never Smoker  . Smokeless tobacco: Never Used  Substance and Sexual Activity  . Alcohol use: No    Alcohol/week: 0.0 standard drinks  . Drug use: No  . Sexual activity: Yes    Birth control/protection: I.U.D.  Other Topics Concern  . Not on file  Social History Narrative   Originally from [place],  outside of [place].    Moved here permanently 2012.   Intermittently worked on a farm.   Lives with many cats--3 plus fosters cats.    Single, lives alone in a one story home. Rarely drinks caffeine. 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   Social Determinants of Health   Financial Resource Strain:   . Difficulty of Paying Living Expenses:   Food Insecurity:   . Worried About Charity fundraiser in the Last Year:   . Arboriculturist in the Last Year:   Transportation Needs:   . Film/video editor (Medical):   [name] Lack of Transportation (Non-Medical):   Physical Activity:   . Days of Exercise per Week:   . Minutes of Exercise per  Session:   Stress:   . Feeling of Stress :   Social Connections:   . Frequency of Communication with Friends and Family:   . Frequency of Social Gatherings with Friends and Family:   . Attends Religious Services:   . Active Member of Clubs or Organizations:   . Attends Archivist Meetings:   [name] Marital Status:    Past Surgical History:  Procedure Laterality Date  . COLONOSCOPY WITH PROPOFOL N/A 02/25/2017   Procedure: COLONOSCOPY WITH PROPOFOL;  Surgeon: Arta Silence, MD;  Location: [facility];  Service: Endoscopy;  Laterality: N/A;  . left knee lateral release  1993  . scar tisue removal  03/2005   left ankle  . TONSILLECTOMY  age 88's   Past Medical History:  Diagnosis Date  . Anxiety   . Arthritis   . Back pain, chronic   . Bipolar 1 disorder ([place])   . Chronic fatigue syndrome   . DDD (degenerative disc disease), lumbar   . Depression   . Fibromyalgia   . IUD 2012   Mirena  . MTHFR gene mutation ([facility])    BP 125/86   Pulse 98   Temp 97.7 F (36.5 C)   Ht 5\' 7"  (1.702 m)   Wt 183 lb (83 kg)   SpO2 96%   BMI 28.66 kg/m   Opioid Risk Score:   Fall Risk Score:  `1  Depression screen PHQ 2/9  Depression screen [facility] 2/9 03/27/2020 11/28/2019 09/09/2019 05/11/2019 04/08/2018 11/03/2017 09/16/2017  Decreased Interest 2 2 2 3 3 2 3   Down, Depressed, Hopeless 1 2 1 2 2 1 3   PHQ - 2 Score 3 4 3 5 5 3 6   Altered sleeping 1 2 2 1 2 2 3   Tired, decreased energy 3 2 2 3 3 3 2   Change in appetite 2 1 1  0 0 1 2  Feeling bad or failure about yourself  1 1 1 1 1 2 3   Trouble concentrating 2 2 1 1 1 1 2   Moving slowly or fidgety/restless 0 0 0 0 0 0 1  Suicidal thoughts 1 0 0 0 1 1 3   PHQ-9 Score 13 12 10 11 13 13 22   Difficult doing work/chores Very difficult Somewhat difficult Very difficult Very difficult Very difficult Somewhat difficult -    Review of Systems  Constitutional: Positive for diaphoresis and unexpected weight change.  HENT: Negative.   Eyes:  Negative.   Respiratory: Negative.   Gastrointestinal: Positive for constipation.  Endocrine: Negative.   Genitourinary: Negative.   Musculoskeletal: Positive for arthralgias and back pain.  Skin: Negative.   Allergic/Immunologic: Negative.   Neurological: Positive for dizziness and weakness.  Psychiatric/Behavioral: Positive  for dysphoric mood and suicidal ideas. The patient is nervous/anxious.        Objective:   Physical Exam Gen: no distress, normal appearing HEENT: oral mucosa pink and moist, NCAT Cardio: Reg rate Chest: normal effort, normal rate of breathing Abd: soft, non-distended Ext: no edema Skin: intact Neuro: AOx3.  Musculoskeletal: Right groin pain reproduced with hip flexion.  Normal gait.  Psych: pleasant, flat affect, depressed mood, +suicidal thoughts, no active plan.      Assessment & Plan:  Frances [name] is a 44 year old female presenting to establish care for right sided hip pain.  Imaging reviewed and discussed with patient: shows evidence of subchondral sclerosis and degenerative labral tear, both of which may be contributing to her pain. The pain does not significantly impact her quality of life at this time. She is able to continue with the activities she would like to do. Her fibromyalgia appears to be more of the limiting factor, limiting her walking to 15 minutes. Advised that I would not recommend further injections for her hip given that she had no relief with the steroid injection and that other options are not FDA approved and would be expensive for her with little chance of benefit. She is interested in trying Lyrica again for her fibromyalgia and will discuss this with her [facility] orthopedist as she may be able to have the medication covered through her program there. Discussed the importance of exercise for her fibromyalgia and hip pain. Recommended walking and yoga based on her activity preferences.   All questions answered. Return to clinic PRN.

## 2020-03-28 ENCOUNTER — Ambulatory Visit (INDEPENDENT_AMBULATORY_CARE_PROVIDER_SITE_OTHER): Payer: Self-pay | Admitting: Podiatrist

## 2020-03-28 VITALS — Temp 97.2°F

## 2020-03-28 DIAGNOSIS — L600 Ingrowing nail: Secondary | ICD-10-CM

## 2020-03-28 NOTE — Progress Notes (Signed)
  No chief complaint on file.    HPI: Patient is 44 y.o. female who presents today for a painful left hallux nail- medial side.  She had the toenail removed about 6 months ago by her primary care physician for nail fungus- she was on lamisil and completed the course without incident.  As her nail is growing back in the medial side has become painful and grown into the skin.   Review of Systems  DATA OBTAINED: from patient  GENERAL: Feels well no fevers, no fatigue, no changes in appetite SKIN: No itching, no rashes, no open wounds EYES: No eye pain,no redness, no discharge EARS: No earache,no ringing of ears, NOSE: No congestion, no drainage, no bleeding  MOUTH/THROAT: No mouth pain, No sore throat, No difficulty chewing or swallowing  RESPIRATORY: No cough, no wheezing, no SOB CARDIAC: No chest pain,no heart palpitations, GI: No abdominal pain, No Nausea, no vomiting, no diarrhea, no heartburn or no reflux  GU: No dysuria, no increased frequency or urgency MUSCULOSKELETAL: No unrelieved bone/joint pain,  NEUROLOGIC: Awake, alert, appropriate to situation, No change in mental status. PSYCHIATRIC: No overt anxiety or sadness.No behavior issue.      Physical Exam  GENERAL APPEARANCE: Alert, conversant. Appropriately groomed. No acute distress.   VASCULAR: Pedal pulses palpable DP and PT bilateral.  Capillary refill time is immediate to all digits,  Proximal to distal cooling it warm to warm.  Digital hair growth is present bilateral   NEUROLOGIC: sensation is intact epicritically and protectively to 5.07 monofilament at 5/5 sites bilateral.  Light touch is intact bilateral, vibratory sensation intact bilateral, achilles tendon reflex is intact bilateral.   MUSCULOSKELETAL: acceptable muscle strength, tone and stability bilateral.  Intrinsic muscluature intact bilateral.  Range of motion at ankle and first MPJ is normal bilateral.   DERMATOLOGIC: skin is warm, supple, and dry.  No  open lesions noted.  No interdigital maceration noted bilateral.   Medial hallux nail pain. Redness and swelling present.  No pus or drainage noted.     Assessment   Ingrown hallux nail medial side  Plan  Treatment options and alternatives were discussed. Recommended a permanent removal of the medial nail border of the right hallux nail. Patient agreed. Skin was prepped with alcohol and a local injection of lidocaine and Marcaine plain was infiltrated to anesthetize the toe. The toe was then prepped with Betadine exsanguinated. The offending nail border is removed and phenol applied.  It was cleansed well with alcohol. Antibiotic ointment and a dressing was then applied and the patient was given instructions for aftercare. Patient will be seen for a nail check in 1 week. And will call if any questions or concerns arise.

## 2020-03-28 NOTE — Patient Instructions (Signed)
Soak Instructions    THE DAY AFTER THE PROCEDURE  Place 1/4 cup of epsom salts in a quart of warm tap water.  Submerge your foot or feet with outer bandage intact for the initial soak; this will allow the bandage to become moist and wet for easy lift off.  Once you remove your bandage, continue to soak in the solution for 20 minutes.  This soak should be done twice a day.  Next, remove your foot or feet from solution, blot dry the affected area and cover.  You may use a band aid large enough to cover the area or use gauze and tape.  Apply other medications to the area as directed by the doctor such as polysporin neosporin.  IF YOUR SKIN BECOMES IRRITATED WHILE USING THESE INSTRUCTIONS, IT IS OKAY TO SWITCH TO  WHITE VINEGAR AND WATER. Or you may use antibacterial soap and water to keep the toe clean    

## 2020-04-01 ENCOUNTER — Encounter: Payer: Self-pay | Admitting: Podiatrist

## 2020-04-02 ENCOUNTER — Ambulatory Visit (INDEPENDENT_AMBULATORY_CARE_PROVIDER_SITE_OTHER): Payer: Self-pay | Admitting: Sports Medicine

## 2020-04-02 ENCOUNTER — Other Ambulatory Visit: Payer: Self-pay

## 2020-04-02 DIAGNOSIS — M5136 Other intervertebral disc degeneration, lumbar region: Secondary | ICD-10-CM

## 2020-04-02 DIAGNOSIS — M51369 Other intervertebral disc degeneration, lumbar region without mention of lumbar back pain or lower extremity pain: Secondary | ICD-10-CM

## 2020-04-02 DIAGNOSIS — M5030 Other cervical disc degeneration, unspecified cervical region: Secondary | ICD-10-CM

## 2020-04-02 DIAGNOSIS — S73191S Other sprain of right hip, sequela: Secondary | ICD-10-CM

## 2020-04-02 NOTE — Assessment & Plan Note (Signed)
This pleasant 44 year old female has chronic neck pain with a couple of very small disc protrusions in her cervical spine. Because these are so small recommendation would be to try to maximize her medical management. She did get some suicidality with gabapentin, I would recommend that we try Lyrica instead, my dosing recommendation would be 50 mg twice daily to 3 times daily for 1 week followed by 75 mg twice daily to 3 times daily.

## 2020-04-02 NOTE — Assessment & Plan Note (Signed)
Frances [name] also has lumbar DDD, mild disc protrusions at multiple levels. As above, I would recommend maximization of her medical therapy before considering epidurals. I would recommend that we try Lyrica instead, my dosing recommendation would be 50 mg twice daily to 3 times daily for 1 week followed by 75 mg twice daily to 3 times daily.

## 2020-04-02 NOTE — Assessment & Plan Note (Signed)
Frances [name] has a right labial degeneration with tear. She has had steroid injections, viscosupplementation without improvement. She is not yet ready to consider hip arthroplasty. The neck step would be PRP injection times three 1 week apart into the hip joint and around the anterior labral tear itself, I would attempt to pepper the tear during the procedure. She will think about this, she would need to be off of NSAIDs for 1 week prior to PRP if we decide to do it, I would also recommend using leukocyte rich rather than leukocyte poor PRP.

## 2020-04-02 NOTE — Progress Notes (Signed)
    Procedures performed today:    None.  Independent interpretation of notes and tests performed by another provider:   Personally reviewed her lumbar and cervical spine MRIs with her today, she has multilevel mild disc protrusions in the lumbar spine, no overt central canal stenosis, mild foraminal stenosis.  Cervical spine MRI shows C5-C7 disc protrusions with no central or foraminal stenosis.  Brief History, Exam, Impression, and Recommendations:    Labral tear of right hip joint Frances [name] has a right labial degeneration with tear. She has had steroid injections, viscosupplementation without improvement. She is not yet ready to consider hip arthroplasty. The neck step would be PRP injection times three 1 week apart into the hip joint and around the anterior labral tear itself, I would attempt to pepper the tear during the procedure. She will think about this, she would need to be off of NSAIDs for 1 week prior to PRP if we decide to do it, I would also recommend using leukocyte rich rather than leukocyte poor PRP.  DDD (degenerative disc disease), cervical This pleasant 44 year old female has chronic neck pain with a couple of very small disc protrusions in her cervical spine. Because these are so small recommendation would be to try to maximize her medical management. She did get some suicidality with gabapentin, I would recommend that we try Lyrica instead, my dosing recommendation would be 50 mg twice daily to 3 times daily for 1 week followed by 75 mg twice daily to 3 times daily.  DDD (degenerative disc disease), lumbar Frances [name] also has lumbar DDD, mild disc protrusions at multiple levels. As above, I would recommend maximization of her medical therapy before considering epidurals. I would recommend that we try Lyrica instead, my dosing recommendation would be 50 mg twice daily to 3 times daily for 1 week followed by 75 mg twice daily to 3 times  daily.    ___________________________________________ Gwen Her. Dianah Field, M.D., ABFM., CAQSM. Primary Care and [place] Instructor of [place] [employer] [facility] of Medicine

## 2020-04-02 NOTE — Patient Instructions (Signed)
 Platelet-rich plasma is used in musculoskeletal medicine to focus your own body's ability to heal. It has several well-done published randomized control trials (RCT) which demonstrate both its effectiveness and safety in many musculoskeletal conditions, including osteoarthritis, tendinopathies, and damaged vertebral discs. PRP has been in clinical use since the 1990's. Many people know that platelets form a clot if there is a cut in the skin. It turns out that platelets do not only form a clot, they also start the body's own repair process. When platelets activate to form a clot, they also release alpha granules which have hundreds of chemical messengers in them that initiate and organize repair to the damaged tissue. Precisely placing PRP into the site of injury will initiate the healing process by activating on the damaged cartilage or tendon. This is an inflammatory process, and inflammation is the vital first phase of Healing.  What to expect and how to prepare for PRP  . 2 weeks prior to the procedure: depending on the procedure, you may need to arrange for a driver to bring you home. IF you are having a lower extremity procedure, we can provide crutches as needed.  . 7 days prior to the procedure: Stop taking anti-inflammatory drugs like ibuprofen, Naprosyn, Celebrex, or Meloxicam. Let your doctor know if you have been taking prednisone or other corticosteroids in the last month.  . The day before the procedure: thoroughly shower and clean your skin.   . The day of the procedure: Wear loose-fitting clothing like sweatpants or shorts. If you are having an upper body procedure wear a top that can button or zip up.  PRP will initiate healing and a productive inflammation, and PRP therapy will make the body part treated sore for 4 days to two weeks. Anti-inflammatory drugs (i.e. ibuprofen, Naprosyn, Celebrex) and corticosteroids such as prednisone can blunt or stop this process, so  it is important to not take any anti-inflammatory drugs for 7 days before getting PRP therapy, or for at least three weeks after PRP therapy. Corticosteroid injections can blunt inflammation for 30 days, so let us know if you have had one recently. Depending on the body part injected, you may be in a sling or on crutches for several days. Just like wringing out a wet dishcloth, if you load or tense a tendon or ligament that has just been injected with PRP, some of the PRP injected will squish out. By keeping the body part treated relaxed by using a sling (for the shoulder or arm) or crutches (for hips and legs) for a few days, the PRP can bind in place and do its job.   You may need a driver to bring you home.  Tobacco?nicotine is a potent toxin and its use constricts small blood vessels which are needed for tissue repair.  Tobacco/nicotine use will limit the effectiveness of any treatment and stopping tobacco use is one of the single  greatest actions you can take to improve your health. Avoid toxins like alcohol, which inhibits and depresses the cells needed for tissue repair.  What happens during the PRP procedure?  Platelet rich plasma is made by taking some of your blood and performing a two-stage centrifuge process on it to concentrate the PRP. First, your blood is drawn into a syringe with a small amount of anti-coagulant in it (this is to keep the blood from clotting during this process). The amount of blood drawn is usually about 30-60 milliliters, depending on how much PRP is needed for   the treatment.  (There are 355 milliliters in a 12-ounce soda can for comparison).  Then the blood is transferred in a sterile fashion into a centrifuge tube. It is then centrifuged for the first cycle where the red blood cells are isolated and discarded. In the second centrifuge cycle, the platelet-rich fraction of the remaining plasma is concentrated and placed in a syringe. The skin at the  injection site is numbed with a small amount of topical cooling spray. Dr Celena Lanius will then precisely inject the PRP into the injury site using ultrasound guidance.  What to do after your procedure  I will give you specific medicine to control any discomfort you may have after the procedure. Avoid NSAIDs like ibuprofen. Acetaminophen can be used for mild pain.  Depending on the part of the body treated, usually you will be placed in a sling or on crutches for 1 to 3 days. Do your best not to tense or load the treated area during this time. After 3 days, unless otherwise instructed, the treated body part should be used and slowly moved through its full range of motion. It will be sore, but you will not be doing damage by moving it, in fact it needs to move to heal. If you were on crutches for a period of time, walking is ok once you are off the crutches. For now, avoid activities that specifically hurt you before being treated. Exercise is vital to good health and finding a way to cross train around your injury is important not only for your physical health, but for your mental health as well. Ask me about cross training options for your injury. Some brief (10 minutes or less) period of heat or ice therapy will not hurt the therapy, but it is not required. Usually, depending on the initial injury, physical therapy is started from two weeks to four weeks after injection. Improvements in pain and function should be expected from 8 weeks to 12 weeks after injection and some injuries may require more than one treatment.    ___________________________________________ Makaelah Cranfield J. Norm Wray, M.D., ABFM., CAQSM. Primary Care and Sports Medicine [facility]  Adjunct Professor of Family Medicine  [employer]   

## 2020-04-06 ENCOUNTER — Telehealth: Payer: Self-pay | Admitting: *Deleted

## 2020-04-06 NOTE — Telephone Encounter (Signed)
Pt called and asked how long she was to perform the soaking after the ingrown toenail procedure. I told her it was generally 2-3 weeks and close to the end of the 3rd week perform the last soaking of the day an allow it to air dry, if the area got a dry hard scab, without redness, swelling or drainage could stop the soaks. Pt states that is what she has been doing and the toe looks fine.

## 2020-04-13 ENCOUNTER — Ambulatory Visit: Admit: 2020-04-13 | Discharge: 2020-04-14

## 2020-04-13 DIAGNOSIS — Z8739 Personal history of other diseases of the musculoskeletal system and connective tissue: Principal | ICD-10-CM

## 2020-04-13 MED ORDER — COLCHICINE 0.6 MG TABLET
ORAL_TABLET | Freq: Two times a day (BID) | ORAL | 1 refills | 30 days | Status: CP
Start: 2020-04-13 — End: 2021-04-13
  Filled 2020-04-17: qty 60, 30d supply, fill #0

## 2020-04-13 MED ORDER — PREGABALIN 25 MG CAPSULE
ORAL_CAPSULE | ORAL | 0 refills | 97 days | Status: CP
Start: 2020-04-13 — End: 2020-07-19
  Filled 2020-04-17: qty 166, 30d supply, fill #0

## 2020-04-17 MED FILL — PREGABALIN 25 MG CAPSULE: 30 days supply | Qty: 166 | Fill #0 | Status: AC

## 2020-04-17 MED FILL — COLCHICINE 0.6 MG TABLET: 30 days supply | Qty: 60 | Fill #0 | Status: AC

## 2020-04-23 ENCOUNTER — Telehealth: Payer: Self-pay | Admitting: *Deleted

## 2020-04-23 NOTE — Telephone Encounter (Signed)
I spoke with pt and asked if she was still doing the soaks and she states she was and using the neosporin. I told pt to continue the soaks once daily and leave off the neosporin and allow to air dry and only wear a dry bandaid if in a shoe, and call Thursday with status or sooner if problems. Pt states understanding.

## 2020-04-23 NOTE — Telephone Encounter (Signed)
Pt states she had an partial ingrown toenail removal about 4 weeks ago and it doesn't hurt but it is red around the entire nail

## 2020-04-24 MED FILL — DULOXETINE 30 MG CAPSULE,DELAYED RELEASE: ORAL | 30 days supply | Qty: 60 | Fill #3

## 2020-04-24 MED FILL — DULOXETINE 30 MG CAPSULE,DELAYED RELEASE: 30 days supply | Qty: 60 | Fill #3 | Status: AC

## 2020-04-26 ENCOUNTER — Other Ambulatory Visit: Payer: Self-pay | Admitting: Podiatry

## 2020-04-26 ENCOUNTER — Telehealth: Payer: Self-pay | Admitting: *Deleted

## 2020-04-26 MED ORDER — CEPHALEXIN 500 MG PO CAPS: 500.0000 mg | ORAL_CAPSULE | Freq: Three times a day (TID) | ORAL | 0 refills | Status: DC

## 2020-04-26 NOTE — Telephone Encounter (Signed)
I told pt that I would get another doctor to send in an antibiotic, continue the epsom salt soaks.

## 2020-04-26 NOTE — Telephone Encounter (Signed)
Pt states the toe is still around the toenail, and tender to movement, but is being tested for COVID19.

## 2020-04-26 NOTE — Telephone Encounter (Signed)
I sent keflex to the pharmacy. Once her test for COVID comes back and is cleared to come to the office then she needs to be seen.

## 2020-04-27 NOTE — Telephone Encounter (Signed)
I informed pt of Dr. Leigh Aurora orders.

## 2020-04-30 ENCOUNTER — Telehealth: Payer: Self-pay | Admitting: *Deleted

## 2020-04-30 NOTE — Telephone Encounter (Signed)
I spoke with pt and she states it is still very red and it transferred to scheduler.

## 2020-04-30 NOTE — Telephone Encounter (Signed)
Pt called and states she is negative for COVID, if we need to see her.

## 2020-05-01 ENCOUNTER — Ambulatory Visit (INDEPENDENT_AMBULATORY_CARE_PROVIDER_SITE_OTHER): Payer: Self-pay | Admitting: Sports Medicine

## 2020-05-01 ENCOUNTER — Other Ambulatory Visit: Payer: Self-pay

## 2020-05-01 ENCOUNTER — Encounter: Payer: Self-pay | Admitting: Sports Medicine

## 2020-05-01 VITALS — Temp 97.7°F

## 2020-05-01 DIAGNOSIS — M79675 Pain in left toe(s): Secondary | ICD-10-CM

## 2020-05-01 DIAGNOSIS — Z9889 Other specified postprocedural states: Secondary | ICD-10-CM

## 2020-05-01 NOTE — Progress Notes (Signed)
Subjective: Frances [name] is a 44 y.o. female patient returns to office today for follow up evaluation after having Left Hallux medial permanent nail avulsion performed on (03/28/20) by Dr. Eda Paschal. Patient has been soaking using epsom salt and applying topical antibiotic covered with bandaid daily, reports that it is red, painful off and on and noticed oozing this morning. Patient deniesfever/chills/nausea/vomitting/any other related constitutional symptoms at this time.  Patient Active Problem List   Diagnosis Date Noted  . DDD (degenerative disc disease), cervical 03/13/2020  . Ingrowing left great toenail 03/13/2020  . Labral tear of right hip joint 01/11/2020  . Grief reaction 11/28/2019  . Toenail fungus 09/09/2019  . Vasovagal response 09/09/2019  . Decreased visual acuity 08/30/2018  . Right corneal abrasion 08/30/2018  . Elevated fasting glucose 04/08/2018  . Bipolar 1 disorder, depressed, severe ([place]) 01/25/2018  . Depression   . Anxiety 01/22/2018  . Borderline personality disorder ([place]) 01/22/2018  . Rheumatoid arthritis 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 ([place]) 12/09/2017  . TMJ (temporomandibular joint syndrome) 09/26/2017  . New daily persistent headache 09/26/2017  . MTHFR gene mutation ([place]) 09/20/2017  . Bipolar I disorder ([place]) 09/16/2017  . Fibromyalgia 09/16/2017  . Chronic fatigue syndrome 09/16/2017  . DYSPNEA 10/15/2011  . DERMATITIS, ALLERGIC 06/05/2008  . FATIGUE 05/05/2008  . Hypothyroidism 02/10/2008  . 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 DDD (degenerative disc disease), lumbar 02/10/2008  . FIBROMYALGIA 02/10/2008  . IRON, SERUM, ELEVATED 02/10/2008    Current Outpatient Medications on File Prior to Visit  Medication Sig Dispense Refill  . colchicine 0.6 MG tablet Take by mouth.    . pregabalin (LYRICA) 25 MG capsule Take by mouth.    . ARIPiprazole (ABILIFY) 10 MG tablet Take 1 tablet (10 mg total) by mouth daily. 30 tablet 0   . benztropine (COGENTIN) 0.5 MG tablet Take 0.5 mg by mouth 2 (two) times daily.    [name] buPROPion (WELLBUTRIN XL) 300 MG 24 hr tablet Take 300 mg by mouth daily.    . cephALEXin (KEFLEX) 500 MG capsule Take 1 capsule (500 mg total) by mouth 3 (three) times daily. 21 capsule 0  . cetirizine (ZYRTEC) 10 MG tablet TAKE 1 Tablet BY MOUTH ONCE DAILY 90 tablet 1  . cholecalciferol (VITAMIN D3) 25 MCG (1000 UT) tablet Take 1,000 Units by mouth daily.    . diclofenac (VOLTAREN) 75 MG EC tablet Take 1 tablet (75 mg total) by mouth 2 (two) times daily. 60 tablet 3  . DULoxetine (CYMBALTA) 30 MG capsule Take 60 mg by mouth daily.    [name] HYDROcodone-acetaminophen (NORCO/VICODIN) 5-325 MG tablet Take 1 tablet by mouth as needed for moderate pain (back pain).    . hydroxychloroquine (PLAQUENIL) 200 MG tablet Take by mouth daily.    . montelukast (SINGULAIR) 10 MG tablet Take 1 tablet (10 mg total) by mouth at bedtime. 90 tablet 4  . polyethylene glycol (MIRALAX / GLYCOLAX) 17 g packet Take 17 g by mouth daily.    . traZODone (DESYREL) 50 MG tablet Take by mouth.     No current facility-administered medications on file prior to visit.    Allergies  Allergen Reactions  . Metaxalone Hives    Objective:  General: Well developed, nourished, in no acute distress, alert and oriented x3   Dermatology: Skin is warm, dry and supple bilateral. Left hallux medial nail bed appears to be clean, dry, with mild granular tissue and surrounding eschar/scab. Faint focal erythema. (-) Edema. () serosanguous  drainage present. The remaining nails appear unremarkable at this time. There are no other lesions or other signs of infection present.  Neurovascular status: Intact. No lower extremity swelling; No pain with calf compression bilateral.  Musculoskeletal: Decreased tenderness to palpation of the Left medial hallux nail fold. Muscular strength within normal limits bilateral.   Assesement and Plan: Problem List Items  Addressed This Visit    None    Visit Diagnoses    S/P nail surgery    -  Primary   Toe pain, left          -Examined patient  -Cleansed left hallux medial nail fold and gently scrubbed with peroxide and q-tip/curetted away eschar at site and applied betadine covered with bandaid.  -Discussed plan of care with patient. -Patient to now begin soaking in a weak solution of Betadine and warm water or apply directly to nail fold. May leave open to air at night. -Educated patient on long term care after nail surgery. -Patient was instructed to monitor the toe for reoccurrence and signs of infection; Patient advised to return to office or go to ER if toe becomes more red, hot or swollen. -Continue with Keflex until completed  -Patient is to return as needed or sooner if problems arise.  Landis Martins, DPM

## 2020-05-02 MED ORDER — PREGABALIN 75 MG CAPSULE
ORAL_CAPSULE | Freq: Two times a day (BID) | ORAL | 1 refills | 30.00000 days | Status: CP
Start: 2020-05-02 — End: ?
  Filled 2020-05-14: qty 60, 30d supply, fill #0

## 2020-05-14 MED FILL — COLCHICINE 0.6 MG TABLET: ORAL | 30 days supply | Qty: 60 | Fill #1

## 2020-05-14 MED FILL — COLCHICINE 0.6 MG TABLET: 30 days supply | Qty: 60 | Fill #1 | Status: AC

## 2020-05-14 MED FILL — PREGABALIN 75 MG CAPSULE: 30 days supply | Qty: 60 | Fill #0 | Status: AC

## 2020-05-17 ENCOUNTER — Encounter: Payer: Self-pay | Admitting: Physician Assistant

## 2020-05-17 DIAGNOSIS — M0609 Rheumatoid arthritis without rheumatoid factor, multiple sites: Secondary | ICD-10-CM

## 2020-05-17 DIAGNOSIS — M5137 Other intervertebral disc degeneration, lumbosacral region: Secondary | ICD-10-CM

## 2020-05-17 DIAGNOSIS — G8929 Other chronic pain: Secondary | ICD-10-CM

## 2020-05-17 DIAGNOSIS — M25551 Pain in right hip: Secondary | ICD-10-CM

## 2020-05-17 DIAGNOSIS — M545 Low back pain, unspecified: Secondary | ICD-10-CM

## 2020-05-18 MED ORDER — HYDROCODONE-ACETAMINOPHEN 5-325 MG PO TABS: 1.0000 | ORAL_TABLET | Freq: Four times a day (QID) | ORAL | 0 refills | Status: DC | PRN

## 2020-05-18 NOTE — Addendum Note (Signed)
Addended by: Donella Stade on: 05/18/2020 03:34 PM   Modules accepted: Orders

## 2020-05-18 NOTE — Telephone Encounter (Signed)
October 11/2019 last fill.  Marland KitchenMarland KitchenPDMP reviewed during this encounter. Sent 20 needs appt to refill.

## 2020-05-22 ENCOUNTER — Ambulatory Visit (HOSPITAL_COMMUNITY): Payer: Self-pay | Admitting: Licensed Clinical Social Worker

## 2020-05-23 ENCOUNTER — Institutional Professional Consult (permissible substitution): Admit: 2020-05-23 | Discharge: 2020-05-24

## 2020-05-24 ENCOUNTER — Ambulatory Visit (INDEPENDENT_AMBULATORY_CARE_PROVIDER_SITE_OTHER): Payer: No Payment, Other | Admitting: Licensed Clinical Social Worker

## 2020-05-24 ENCOUNTER — Other Ambulatory Visit: Payer: Self-pay

## 2020-05-24 ENCOUNTER — Ambulatory Visit (HOSPITAL_COMMUNITY): Payer: No Payment, Other | Admitting: Licensed Clinical Social Worker

## 2020-05-24 ENCOUNTER — Telehealth (HOSPITAL_COMMUNITY): Payer: Self-pay | Admitting: Psychiatry

## 2020-05-24 ENCOUNTER — Telehealth (INDEPENDENT_AMBULATORY_CARE_PROVIDER_SITE_OTHER): Payer: No Payment, Other | Admitting: Psychiatry

## 2020-05-24 ENCOUNTER — Telehealth (HOSPITAL_COMMUNITY): Payer: No Payment, Other | Admitting: Psychiatry

## 2020-05-24 DIAGNOSIS — F411 Generalized anxiety disorder: Secondary | ICD-10-CM | POA: Diagnosis not present

## 2020-05-24 DIAGNOSIS — F331 Major depressive disorder, recurrent, moderate: Secondary | ICD-10-CM | POA: Diagnosis not present

## 2020-05-24 DIAGNOSIS — F319 Bipolar disorder, unspecified: Secondary | ICD-10-CM | POA: Diagnosis not present

## 2020-05-24 DIAGNOSIS — F3341 Major depressive disorder, recurrent, in partial remission: Secondary | ICD-10-CM | POA: Diagnosis not present

## 2020-05-24 DIAGNOSIS — F603 Borderline personality disorder: Secondary | ICD-10-CM

## 2020-05-24 MED ORDER — ARIPIPRAZOLE 10 MG PO TABS: 10.0000 mg | ORAL_TABLET | Freq: Every day | ORAL | 0 refills | Status: DC

## 2020-05-24 MED ORDER — TRAZODONE HCL 50 MG PO TABS: 50.0000 mg | ORAL_TABLET | Freq: Every day | ORAL | 1 refills | Status: DC

## 2020-05-24 MED ORDER — BUPROPION HCL ER (XL) 300 MG PO TB24: 300.0000 mg | ORAL_TABLET | Freq: Every day | ORAL | 1 refills | Status: DC

## 2020-05-24 MED ORDER — BENZTROPINE MESYLATE 0.5 MG PO TABS: 0.5000 mg | ORAL_TABLET | Freq: Two times a day (BID) | ORAL | 1 refills | Status: DC

## 2020-05-24 MED FILL — CYMBALTA 30 MG CAPSULE,DELAYED RELEASE: 30 days supply | Qty: 60 | Fill #4 | Status: AC

## 2020-05-24 MED FILL — CYMBALTA 30 MG CAPSULE,DELAYED RELEASE: ORAL | 30 days supply | Qty: 60 | Fill #4

## 2020-05-24 NOTE — Progress Notes (Signed)
Comprehensive Clinical Assessment (CCA) Note  05/24/2020 Frances [name] [id number]   Virtual Visit via Video Note  I connected with Para March on 05/24/20 at  2:00 PM EDT by a video enabled telemedicine application and verified that I am speaking with the correct person using two identifiers.  Location: Patient: [facility]  Provider: [facility]    I discussed the limitations of evaluation and management by telemedicine and the availability of in person appointments. The patient expressed understanding and agreed to proceed.      I discussed the assessment and treatment plan with the patient. The patient was provided an opportunity to ask questions and all were answered. The patient agreed with the plan and demonstrated an understanding of the instructions.   The patient was advised to call back or seek an in-person evaluation if the symptoms worsen or if the condition fails to improve as anticipated.  I provided 60 minutes of non-face-to-face time during this encounter.   [name], LCSW    Visit Diagnosis:      ICD-10-CM   1. Major depressive disorder, recurrent episode, moderate (HCC)  F33.1   2. Bipolar I disorder ([place])  F31.9   3. Borderline personality disorder ([place])  F60.3   4. Generalized anxiety disorder  F41.1       CCA Screening, Triage and Referral (STR)  Patient Reported Information How did you hear about [place]? Other (Comment)  Referral name: Transfer from [facility]  Referral phone number: No data recorded  Whom do you see for routine medical problems? Primary Care  Practice/Facility Name: [facility]  What Do You Feel Would Help You the Most Today? Medication;Therapy   Have You Recently Been in Any Inpatient Treatment (Hospital/Detox/Crisis Center/28-Day Program)? No (Last IP was 2019)   Have You Ever Received Services From [facility] Before? Yes (PCP)  Who Do You See at [facility]? PCP   Have You  Recently Had Any Thoughts About Hurting Yourself? Yes (no plan at this time last thoughts were 1 week ago.)  Are You Planning to Commit Suicide/Harm Yourself At This time? No   Have you Recently Had Thoughts About [place]? No   Have You Used Any Alcohol or Drugs in the Past 24 Hours? No   Do You Currently Have a Therapist/Psychiatrist? Yes  Name of Therapist/Psychiatrist: No data recorded  Have You Been Recently Discharged From Any Office Practice or Programs? No     CCA Screening Triage Referral Assessment Type of Contact: Tele-Assessment  Is this Initial or Reassessment? Reassessment  Patient Reported Information Reviewed? Yes   Is CPS involved or ever been involved? Never  Is APS involved or ever been involved? Never   Patient Determined To Be At Risk for Harm To Self or Others Based on Review of Patient Reported Information or Presenting Complaint? Yes, for Self-Harm (Last thoughts one week ago no plan at the time)   Location of Assessment: No data recorded  Does Patient Present under Involuntary Commitment? No   [place] of Residence: [place]   Patient Currently Receiving the Following Services: Medication Management;Individual Therapy    CCA Biopsychosocial  Intake/Chief Complaint:  CCA Intake With Chief Complaint Chief Complaint/Presenting Problem: Bipolar 1, depression, anxiety, and boarderline personality disorder Patient's Currently Reported Symptoms/Problems: tired, thoughts of SI, feeling of depresive episode 2-3 weekly Individual's Strengths: honesty, reliable,  and caring/helping other people Individual's Abilities: foster care for animals Type of Services Patient Feels Are Needed: therapy,  medication mangment  Mental Health Symptoms Depression:  Depression: Change in energy/activity, Fatigue, Hopelessness, Difficulty Concentrating, Weight gain/loss, Duration of symptoms greater than two weeks  Mania:  Mania: None(Medicaiton working  for pt at time of the assessment no changes or manic/hypomanic episodes)  Anxiety:   Anxiety: None  Psychosis:  Psychosis: None  Trauma:  Trauma: Detachment from others(father left her when she was young)  Obsessions:     Compulsions:     Inattention:     Hyperactivity/Impulsivity:     Oppositional/Defiant Behaviors:     Emotional Irregularity:     Other Mood/Personality Symptoms:      Mental Status Exam Appearance and self-care  Stature:  Stature: Average  Weight:  Weight: Overweight  Clothing:  Clothing: Casual  Grooming:  Grooming: Normal  Cosmetic use:  Cosmetic Use: Age appropriate  Posture/gait:  Posture/Gait: Normal  Motor activity:     Sensorium  Attention:     Concentration:     Orientation:     Recall/memory:     Affect and Mood  Affect:  Affect: Anxious  Mood:     Relating  Eye contact:     Facial expression:     Attitude toward examiner:     Thought and Language  Speech flow:    Thought content:     Preoccupation:     Hallucinations:     Organization:     Transport planner of Knowledge:     Intelligence:     Abstraction:     Judgement:     Reality Testing:     Insight:     Decision Making:     Social Functioning  Social Maturity:     Social Judgement:     Stress  Stressors:     Coping Ability:     Skill Deficits:     Supports:          DSM5 Diagnoses: Patient Active Problem List   Diagnosis Date Noted  . Recurrent major depressive disorder, in partial remission ([place]) 05/24/2020  . Generalized anxiety disorder 05/24/2020  . DDD (degenerative disc disease), cervical 03/13/2020  . Ingrowing left great toenail 03/13/2020  . Labral tear of right hip joint 01/11/2020  . Grief reaction 11/28/2019  . Toenail fungus 09/09/2019  . Vasovagal response 09/09/2019  . Decreased visual acuity 08/30/2018  . Right corneal abrasion 08/30/2018  . Elevated fasting glucose 04/08/2018  . Bipolar 1 disorder, depressed, severe ([place]) 01/25/2018  .  Depression   . Anxiety 01/22/2018  . Borderline personality disorder ([place]) 01/22/2018  . Rheumatoid arthritis 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 ([place]) 12/09/2017  . TMJ (temporomandibular joint syndrome) 09/26/2017  . New daily persistent headache 09/26/2017  . MTHFR gene mutation ([place]) 09/20/2017  . Bipolar I disorder ([place]) 09/16/2017  . Fibromyalgia 09/16/2017  . Chronic fatigue syndrome 09/16/2017  . DYSPNEA 10/15/2011  . DERMATITIS, ALLERGIC 06/05/2008  . FATIGUE 05/05/2008  . Hypothyroidism 02/10/2008  . 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 DDD (degenerative disc disease), lumbar 02/10/2008  . FIBROMYALGIA 02/10/2008  . IRON, SERUM, ELEVATED 02/10/2008    Client is a 44 year old female. Client is referred by [facility]  for a Major depression, bipolar 1, borderline personality disorder, and anxiety .   Client states mental health symptoms as evidenced by: decreased mood, thoughts of SI w/o plan, loss of interest or pleasure in most or all normal activities,  Client denies suicidal and homicidal ideations at this time  Client denies hallucinations  and delusions at this time.  Client was screened for the following SDOH: Transporting/travel, financials, family support, food, physical activity, stress,  Housing and drinking   Assessment Information that integrates subjective and objective details with a therapist's professional interpretation: SW met with pt for 60 min virtual encounter. Pt was present, alert/oriented x 5. Dressed appropriately and answered all questions in a fair manor. Pt present with major depressive disorder as evidence by above symptoms. Goals of therapy will be to increase Psychoeducation of depression as well as increase coping skills. This will be to improve daily life and elevate mood for over 50% of the week.     Clinician assisted client with scheduling the following appointments: 2 week f/u. Clinician details of appointment.    Client was  in agreement with treatment recommendations.  Patient Centered Plan: Patient is on the following Treatment Plan(s):  Depression   Referrals to Alternative Service(s): Referred to Alternative Service(s):   Place:   Date:   Time:    Referred to Alternative Service(s):   Place:   Date:   Time:    Referred to Alternative Service(s):   Place:   Date:   Time:    Referred to Alternative Service(s):   Place:   Date:   Time:     [name]

## 2020-05-24 NOTE — Progress Notes (Signed)
Virtual Visit via Video Note  I connected with Frances [name] on 05/24/20 at  9:00 AM EDT by a video enabled telemedicine application and verified that I am speaking with the correct person using two identifiers.  Location: Patient:[facility] Provider: [facility]    I discussed the limitations of evaluation and management by telemedicine and the availability of in person appointments. The patient expressed understanding and agreed to proceed.   Follow Up Instructions: Pt did meet with SW to discuss therapy options with pt. Pt still wants to speak with [facility] at this time to confirm that her case will be closed. Pt was provided options to come back if she would like.     I discussed the assessment and treatment plan with the patient. The patient was provided an opportunity to ask questions and all were answered. The patient agreed with the plan and demonstrated an understanding of the instructions.   The patient was advised to call back or seek an in-person evaluation if the symptoms worsen or if the condition fails to improve as anticipated.  I provided 5 minutes of non-face-to-face time during this encounter.   [name], LCSW

## 2020-05-24 NOTE — Progress Notes (Addendum)
Psychiatric Initial Adult Assessment  Virtual Visit via Video Note  I connected with Frances [name] on 05/29/20 at 10:00 AM EDT by a video enabled telemedicine application and verified that I am speaking with the correct person using two identifiers.  Location: Patient: Home Provider: Clinic   I discussed the limitations of evaluation and management by telemedicine and the availability of in person appointments. The patient expressed understanding and agreed to proceed.  I provided 45 minutes of non-face-to-face time during this encounter.     Patient Identification: Frances [name] MRN:  [id number] Date of Evaluation:  05/29/2020 Referral Source: Beverly Sessions  Chief Complaint:  "I think Im stable on my medication"  Visit Diagnosis:    ICD-10-CM   1. Bipolar I disorder (HCC)  F31.9 benztropine (COGENTIN) 0.5 MG tablet    ARIPiprazole (ABILIFY) 15 MG tablet  2. Recurrent major depressive disorder, in partial remission (HCC)  F33.41 traZODone (DESYREL) 50 MG tablet    ARIPiprazole (ABILIFY) 15 MG tablet  3. Generalized anxiety disorder  F41.1 buPROPion (WELLBUTRIN XL) 300 MG 24 hr tablet    History of Present Illness: 44 year old female seen today for initial psychiatric evaluation.  Patient was referred to outpatient psychiatry by [facility] for medication management.  Patient is currently prescribed Abilify 15 mg, Cogentin 0.5 mg, Wellbutrin 300 mg, trazodone 50, and Cymbalta 30 mg (Cymbalta from her rheumatologist).  The patient reports that she is doing well on current medication regiment.  She denies symptoms of anxiety and endorses depression 3/10 today noting that her depression comes and goes.  She denies distractibility, irritability, grandiosity, flight of ideas, impulsiveness, sleep disturbances, or talkativeness.  She reports that she is always tired but she relates her tiredness to having fibromyalgia as well as rheumatoid arthritis.  Patient is followed by a rheumatologist.   Today she denies SI/HI/AVH.  She is agreeable to taking medications as prescribed.  Patient requests prescription refills.  Provider sent prescriptions to requested pharmacies.  No other concerns noted at this time.  Associated Signs/Symptoms: Depression Symptoms:  depressed mood, fatigue, (Hypo) Manic Symptoms:  Irritable Mood, Anxiety Symptoms:  Denies Psychotic Symptoms:  Denies PTSD Symptoms: NA  Past Psychiatric History: Bipolar, anxiety, and depression  Previous Psychotropic Medications: Yes   Substance Abuse History in the last 12 months:  Yes.    Consequences of Substance Abuse: NA  Past Medical History:  Past Medical History:  Diagnosis Date  . Anxiety   . Arthritis   . Back pain, chronic   . Bipolar 1 disorder ([place])   . Chronic fatigue syndrome   . DDD (degenerative disc disease), lumbar   . Depression   . Fibromyalgia   . IUD 2012   Mirena  . MTHFR gene mutation [facility])     Past Surgical History:  Procedure Laterality Date  . COLONOSCOPY WITH PROPOFOL N/A 02/25/2017   Procedure: COLONOSCOPY WITH PROPOFOL;  Surgeon: Arta Silence, MD;  Location: [facility];  Service: Endoscopy;  Laterality: N/A;  . left knee lateral release  1993  . scar tisue removal  03/2005   left ankle  . TONSILLECTOMY  age 22's    Family Psychiatric History: Maternal aunts Bipolar disorder and uncle alcoholic Family History:  Family History  Problem Relation Age of Onset  . Diabetes Mother   . Stroke Mother   . Lupus Mother   . Hypertension Mother   . Congestive Heart Failure Mother   . Mental illness Brother        Not clear  what his diagnosis is--may be related to previous drug use.  . Cancer Maternal Grandmother        colon  . Depression Maternal Uncle   . Alcohol abuse Maternal Uncle   . Diabetes Maternal Grandfather     Social History:   Social History   Socioeconomic History  . Marital status: Single    Spouse name: Not on file  . Number of children: 0  .  Years of education: Not on file  . Highest education level: Bachelor's degree (e.g., BA, AB, BS)  Occupational History  . Occupation: Designer, industrial/product at times  Tobacco Use  . Smoking status: Never Smoker  . Smokeless tobacco: Never Used  Substance and Sexual Activity  . Alcohol use: No    Alcohol/week: 0.0 standard drinks  . Drug use: No  . Sexual activity: Yes    Birth control/protection: I.U.D.  Other Topics Concern  . Not on file  Social History Narrative   Originally from [place], outside of [place].    Moved here permanently 2012.   Intermittently worked on a farm.   Lives with many cats--3 plus fosters cats.    Single, lives alone in a one story home. Rarely drinks caffeine. 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   Social Determinants of Health   Financial Resource Strain:   . Difficulty of Paying Living Expenses:   Food Insecurity:   . Worried About Charity fundraiser in the Last Year:   . Arboriculturist in the Last Year:   Transportation Needs:   . Film/video editor (Medical):   [name] Lack of Transportation (Non-Medical):   Physical Activity:   . Days of Exercise per Week:   . Minutes of Exercise per Session:   Stress:   . Feeling of Stress :   Social Connections:   . Frequency of Communication with Friends and Family:   . Frequency of Social Gatherings with Friends and Family:   . Attends Religious Services:   . Active Member of Clubs or Organizations:   . Attends Archivist Meetings:   [name] Marital Status:     Additional Social History:   Allergies:   Allergies  Allergen Reactions  . Metaxalone Hives    Metabolic Disorder Labs: Lab Results  Component Value Date   HGBA1C 5.2 04/14/2018   MPG 103 04/14/2018   No results found for: PROLACTIN Lab Results  Component Value Date   CHOL 130 04/14/2018   TRIG 192 (H) 04/14/2018   HDL 28 (L) 04/14/2018   CHOLHDL 4.6 04/14/2018   LDLCALC 73 04/14/2018   LDLCALC 99  03/04/2016   Lab Results  Component Value Date   TSH 1.616 01/27/2018    Therapeutic Level Labs: No results found for: LITHIUM No results found for: CBMZ No results found for: VALPROATE  Current Medications: Current Outpatient Medications  Medication Sig Dispense Refill  . ARIPiprazole (ABILIFY) 15 MG tablet Take 1 tablet (15 mg total) by mouth daily. 30 tablet 1  . benztropine (COGENTIN) 0.5 MG tablet Take 1 tablet (0.5 mg total) by mouth 2 (two) times daily. 30 tablet 1  . buPROPion (WELLBUTRIN XL) 300 MG 24 hr tablet Take 1 tablet (300 mg total) by mouth daily. 30 tablet 1  . cephALEXin (KEFLEX) 500 MG capsule Take 1 capsule (500 mg total) by mouth 3 (three) times daily. 21 capsule 0  . cetirizine (ZYRTEC) 10 MG tablet TAKE 1 Tablet BY MOUTH ONCE DAILY  90 tablet 1  . cholecalciferol (VITAMIN D3) 25 MCG (1000 UT) tablet Take 1,000 Units by mouth daily.    . colchicine 0.6 MG tablet Take by mouth.    . diclofenac (VOLTAREN) 75 MG EC tablet TAKE 1 TABLET BY MOUTH TWICE A DAY 60 tablet 3  . DULoxetine (CYMBALTA) 30 MG capsule Take 60 mg by mouth daily.    [name] HYDROcodone-acetaminophen (NORCO/VICODIN) 5-325 MG tablet Take 1 tablet by mouth every 6 (six) hours as needed for moderate pain (back pain). 20 tablet 0  . hydroxychloroquine (PLAQUENIL) 200 MG tablet Take by mouth daily.    . montelukast (SINGULAIR) 10 MG tablet Take 1 tablet (10 mg total) by mouth at bedtime. 90 tablet 4  . polyethylene glycol (MIRALAX / GLYCOLAX) 17 g packet Take 17 g by mouth daily.    . pregabalin (LYRICA) 25 MG capsule Take by mouth.    . traZODone (DESYREL) 50 MG tablet Take by mouth.    . traZODone (DESYREL) 50 MG tablet Take 1 tablet (50 mg total) by mouth at bedtime. 30 tablet 1   No current facility-administered medications for this visit.    Musculoskeletal: Strength & Muscle Tone: Unable to assess because of telehealth visit.  Gait & Station: Unable to assess because of telehealth visit.  Patient  leans: Right  Psychiatric Specialty Exam: Review of Systems  There were no vitals taken for this visit.There is no height or weight on file to calculate BMI.  General Appearance: Well Groomed  Eye Contact:  Good  Speech:  Clear and Coherent and Normal Rate  Volume:  Normal  Mood:  Euthymic  Affect:  Congruent  Thought Process:  Coherent and Linear  Orientation:  Full (Time, Place, and Person)  Thought Content:  WDL and Logical  Suicidal Thoughts:  No  Homicidal Thoughts:  No  Memory:  Immediate;   Good Recent;   Good Remote;   Good  Judgement:  Good  Insight:  Good  Psychomotor Activity:  Normal  Concentration:  Concentration: Good and Attention Span: Good  Recall:  Good  Fund of Knowledge:Good  Language: Good  Akathisia:  No  Handed:  Right  AIMS (if indicated):  Not done  Assets:  Communication Skills Desire for Improvement Financial Resources/Insurance Housing  ADL's:  Intact  Cognition: WNL  Sleep:  Good   Screenings: AIMS     Admission (Discharged) from 01/25/2018 in [facility] [facility]  AIMS Total Score  0    AUDIT     Admission (Discharged) from 01/25/2018 in [place] [facility]  Alcohol Use Disorder Identification Test Final Score (AUDIT)  0    GAD-7     Office Visit from 11/28/2019 in [facility] Office Visit from 09/09/2019 in [facility] [place] Visit from 05/11/2019 in [place] Office Visit from 04/08/2018 in [place] Visit from 11/03/2017 in [place]  Total GAD-7 Score  4  2  2  7  8     PHQ2-9     Counselor from 05/24/2020 in [facility] Office Visit from 03/27/2020 in [facility] Office Visit from 11/28/2019 in [place]  Office Visit from 09/09/2019 in [place] Visit from 05/11/2019 in [place]  [facility]  PHQ-2 Total Score  2  3  4  3  5   PHQ-9 Total Score  11  13  12  10  11       Assessment and Plan: Patient reports that she is doing well on current medication regimen.  At this time she does not request medication adjustment.  Patient is agreeable to continue medications as prescribed and requests refills.  Provider sent refills to requested pharmacies.  1. Bipolar I disorder (HCC)  - benztropine (COGENTIN) 0.5 MG tablet; Take 1 tablet (0.5 mg total) by mouth 2 (two) times daily.  Dispense: 30 tablet; Refill: 1 - ARIPiprazole (ABILIFY) 15 MG tablet; Take 1 tablet (15 mg total) by mouth daily.  Dispense: 30 tablet; Refill: 1  2. Recurrent major depressive disorder, in partial remission (HCC)  - traZODone (DESYREL) 50 MG tablet; Take 1 tablet (50 mg total) by mouth at bedtime.  Dispense: 30 tablet; Refill: 1 - ARIPiprazole (ABILIFY) 15 MG tablet; Take 1 tablet (15 mg total) by mouth daily.  Dispense: 30 tablet; Refill: 1  3. Generalized anxiety disorder  - buPROPion (WELLBUTRIN XL) 300 MG 24 hr tablet; Take 1 tablet (300 mg total) by mouth daily.  Dispense: 30 tablet; Refill: 1   Salley Slaughter, NP 6/8/20219:56 AM

## 2020-05-29 ENCOUNTER — Other Ambulatory Visit: Payer: Self-pay | Admitting: Sports Medicine

## 2020-05-29 DIAGNOSIS — M5136 Other intervertebral disc degeneration, lumbar region: Secondary | ICD-10-CM

## 2020-05-29 MED ORDER — ARIPIPRAZOLE 15 MG PO TABS: 15.0000 mg | ORAL_TABLET | Freq: Every day | ORAL | 1 refills | Status: DC

## 2020-05-29 NOTE — Addendum Note (Signed)
Addended by: Salley Slaughter on: 05/29/2020 09:56 AM   Modules accepted: Orders

## 2020-06-01 ENCOUNTER — Ambulatory Visit (INDEPENDENT_AMBULATORY_CARE_PROVIDER_SITE_OTHER): Payer: Self-pay | Admitting: Sports Medicine

## 2020-06-01 ENCOUNTER — Other Ambulatory Visit: Payer: Self-pay

## 2020-06-01 DIAGNOSIS — M51369 Other intervertebral disc degeneration, lumbar region without mention of lumbar back pain or lower extremity pain: Secondary | ICD-10-CM

## 2020-06-01 DIAGNOSIS — M5136 Other intervertebral disc degeneration, lumbar region: Secondary | ICD-10-CM

## 2020-06-01 DIAGNOSIS — M5030 Other cervical disc degeneration, unspecified cervical region: Secondary | ICD-10-CM

## 2020-06-01 DIAGNOSIS — M797 Fibromyalgia: Principal | ICD-10-CM

## 2020-06-01 MED ORDER — PREGABALIN 150 MG CAPSULE
ORAL_CAPSULE | Freq: Two times a day (BID) | ORAL | 3 refills | 30.00000 days | Status: CP
Start: 2020-06-01 — End: ?
  Filled 2020-06-06: qty 60, 30d supply, fill #0

## 2020-06-01 NOTE — Assessment & Plan Note (Signed)
Chronic neck pain, very small disc protrusions in her cervical spine, nothing that appears surgical. She did get some suicidality with gabapentin so we tried Lyrica instead, at this point she is on 75 mg twice daily and has not noted any improvement yet, she does not notice any sedation. Increasing to 150 mg twice daily. I will forward my note to her rheumatologist who is able to get her Lyrica for free.

## 2020-06-01 NOTE — Progress Notes (Signed)
    Procedures performed today:    None.  Independent interpretation of notes and tests performed by another provider:   None.  Brief History, Exam, Impression, and Recommendations:    DDD (degenerative disc disease), cervical Chronic neck pain, very small disc protrusions in her cervical spine, nothing that appears surgical. She did get some suicidality with gabapentin so we tried Lyrica instead, at this point she is on 75 mg twice daily and has not noted any improvement yet, she does not notice any sedation. Increasing to 150 mg twice daily. I will forward my note to her rheumatologist who is able to get her Lyrica for free.  DDD (degenerative disc disease), lumbar Frances [name] does have chronic low back pain with multiple disc protrusions. She did get some suicidality with gabapentin so we tried Lyrica instead, at this point she is on 75 mg twice daily and has not noted any improvement yet, she does not notice any sedation. Increasing to 150 mg twice daily. I will forward my note to her rheumatologist who is able to get her Lyrica for free. Ultimately we should maximize Lyrica before considering epidurals.    ___________________________________________ Gwen Her. Dianah Field, M.D., ABFM., CAQSM. Primary Care and [place] Instructor of [place] of [facility] of Medicine

## 2020-06-01 NOTE — Assessment & Plan Note (Signed)
Frances [name] does have chronic low back pain with multiple disc protrusions. She did get some suicidality with gabapentin so we tried Lyrica instead, at this point she is on 75 mg twice daily and has not noted any improvement yet, she does not notice any sedation. Increasing to 150 mg twice daily. I will forward my note to her rheumatologist who is able to get her Lyrica for free. Ultimately we should maximize Lyrica before considering epidurals.

## 2020-06-06 MED FILL — PREGABALIN 150 MG CAPSULE: 30 days supply | Qty: 60 | Fill #0 | Status: AC

## 2020-06-07 ENCOUNTER — Other Ambulatory Visit: Payer: Self-pay

## 2020-06-07 ENCOUNTER — Ambulatory Visit (INDEPENDENT_AMBULATORY_CARE_PROVIDER_SITE_OTHER): Payer: No Payment, Other | Admitting: Licensed Clinical Social Worker

## 2020-06-07 DIAGNOSIS — F319 Bipolar disorder, unspecified: Secondary | ICD-10-CM | POA: Diagnosis not present

## 2020-06-07 DIAGNOSIS — F320 Major depressive disorder, single episode, mild: Secondary | ICD-10-CM

## 2020-06-07 NOTE — Progress Notes (Signed)
   THERAPIST PROGRESS NOTE  Session Time: 1  Participation Level: Active  Behavioral Response: GuardedAlertAnxious  Type of Therapy: Individual Therapy  Treatment Goals addressed: Diagnosis: depression   Interventions: CBT  Summary: Frances [name] is a 44 y.o. female who presents with bipolar .   Suicidal/Homicidal: Nowithout intent/plan    Plan: Return again in 2 weeks.  Diagnosis: Axis I: Bipolar, Depressed    Axis II: No diagnosis    Subjective: Pt and started out session explaining depression symptoms. Pt explained daily routine to LCSW. Pt has good support outside of therapy remain going to support groups via zoom T, TH, and Fri. Pt explained that she is close with one member currently. Use to be close to 2 other members but states that "The one girl must have paid for my address". Pt states that she went into [facility] and after she got out her relationship was not the same, although they remain passing friends. Pt reports she feels stable on current medications. There are a couple of medications she is not even sure if they are doing anything.  Wellbutrin, and Cymbalta is the one she questions the most. Pt was advised to f/u with provider to discuss questions. LCSW provided pt with homework to pick out favorite mandala coloring to show LCSW. Pt showed them and explained the reasoning she choose them.   Objective:  Pt alert and oriented x 5. Flat anxious affect. Engaged with assessment but was guarded.     Assessment: Pt exhibits increased signs of depression fatigue, indecisiveness, isolation, lack of energy, 2-3 days weekly too much, and poor recall memory. Currently pt is being treated for bipolar disorder and meet criteria for bipolar depression.   Plan: Plan to talk at park 2 x weekly and document her walk, then walk horse 1 x weekly, f/u in two weeks for therapy and has f/u for med mgnt in Aug. Color 1 mandala for next session and explain colors  chosen.   [name], LCSW 06/07/2020

## 2020-06-12 ENCOUNTER — Other Ambulatory Visit: Payer: Self-pay | Admitting: Podiatrist

## 2020-06-12 ENCOUNTER — Telehealth: Payer: Self-pay | Admitting: Podiatrist

## 2020-06-12 ENCOUNTER — Other Ambulatory Visit: Payer: Self-pay | Admitting: Physician Assistant

## 2020-06-12 MED ORDER — SULFAMETHOXAZOLE-TRIMETHOPRIM 800-160 MG PO TABS: 1.0000 | ORAL_TABLET | Freq: Two times a day (BID) | ORAL | 0 refills | Status: DC

## 2020-06-12 MED ORDER — MUPIROCIN 2 % EX OINT
1.0000 | TOPICAL_OINTMENT | Freq: Two times a day (BID) | CUTANEOUS | 0 refills | Status: DC
Start: 2020-06-12 — End: 2020-08-06

## 2020-06-12 NOTE — Telephone Encounter (Signed)
I spoke with pt and her toe from the 03/2020 procedure is swollen, red, purple and painful. I told pt to do epsom salt soaks until seen in office, leave of the ointment and I would inform the doctor to see if she wanted to order an antibitotic prior to her being seen. Pt states she can't afford $200.00 for a follow up.

## 2020-06-12 NOTE — Telephone Encounter (Signed)
Pt called and state dthat her toe does not seem to be healing its red and perple on the sides from having nail removed on 03/25/20 wants to know what to do message was left @ 1:03 pm please advise

## 2020-06-14 ENCOUNTER — Telehealth: Payer: Self-pay | Admitting: *Deleted

## 2020-06-14 NOTE — Telephone Encounter (Signed)
-----   Message from Bronson Ing, [place] sent at 06/12/2020 10:01 PM EDT ----- Regarding: antibiotics Hi Raahi Korber-  I haven't figured out how to reply directly to your messages so I'm re- replying here in case I did it wrong...  Please let the patient know there is no charge for a follow up to check her toe-  I would like to see her if the toe does not look like it is healing.  Please ask for her to make an appointment and OK to let schedulers know there won't be a charge since its a follow up .  Also, I called in 2 rx- 1 for oral antibiotic to take twice daily (its not the same one she was on before)  And another is an ointment to put on her toe.  She should continue to soak in epsom salts.  Thanks!

## 2020-06-14 NOTE — Telephone Encounter (Signed)
I informed pt of Dr. Shaune Pollack orders and she stated she has an appt.

## 2020-06-18 ENCOUNTER — Ambulatory Visit (INDEPENDENT_AMBULATORY_CARE_PROVIDER_SITE_OTHER): Payer: No Typology Code available for payment source | Admitting: Podiatrist

## 2020-06-18 ENCOUNTER — Other Ambulatory Visit: Payer: Self-pay

## 2020-06-18 VITALS — Temp 96.3°F

## 2020-06-18 DIAGNOSIS — Z9889 Other specified postprocedural states: Secondary | ICD-10-CM

## 2020-06-18 NOTE — Progress Notes (Signed)
Chief Complaint  Patient presents with  . Follow-up    Nail check - L hallux. Pt stated, "I don't think it's better. Sometimes I have pain, sometimes I don't. I see clear drainage sometimes".    Patient presents today for follow up of phenol matrixectomy- medial side of the left hallux.. she relates it continues to be red and painful and some clear drainage has been coming out of the nail at times.  She has been taking the antibiotic as instructed.  Objective:  Neurovascular status intact and unchanged from previous visit.  Left hallux nail has a small spicule along the medial border that appears to be catching on the nail fold and causing irritation.  No active drainage expressed.    Assessment/Plan:  Nail spicule left hallux medial border  Plan:  Anesthetized the medial side of the toe and prepped the toe with betadine. The spicule was excised with a nipper.  Antibiotic ointment and a dressing applied.  Epsom salt soaks recommended for the next 1-2 days.  Should this fail to resolve the symptoms she is to call.  This should however remedy the problem.

## 2020-06-19 ENCOUNTER — Encounter: Payer: Self-pay | Admitting: Podiatrist

## 2020-06-19 ENCOUNTER — Encounter (INDEPENDENT_AMBULATORY_CARE_PROVIDER_SITE_OTHER): Payer: Self-pay

## 2020-06-19 DIAGNOSIS — M51369 Other intervertebral disc degeneration, lumbar region without mention of lumbar back pain or lower extremity pain: Secondary | ICD-10-CM

## 2020-06-19 DIAGNOSIS — M5030 Other cervical disc degeneration, unspecified cervical region: Secondary | ICD-10-CM

## 2020-06-19 DIAGNOSIS — M5136 Other intervertebral disc degeneration, lumbar region: Secondary | ICD-10-CM

## 2020-06-19 NOTE — Telephone Encounter (Signed)
I spent 5 total minutes of online digital evaluation and management services. 

## 2020-06-21 ENCOUNTER — Ambulatory Visit (INDEPENDENT_AMBULATORY_CARE_PROVIDER_SITE_OTHER): Payer: No Payment, Other | Admitting: Licensed Clinical Social Worker

## 2020-06-21 ENCOUNTER — Other Ambulatory Visit: Payer: Self-pay

## 2020-06-21 DIAGNOSIS — F3341 Major depressive disorder, recurrent, in partial remission: Secondary | ICD-10-CM

## 2020-06-21 DIAGNOSIS — F319 Bipolar disorder, unspecified: Secondary | ICD-10-CM

## 2020-06-21 DIAGNOSIS — F411 Generalized anxiety disorder: Secondary | ICD-10-CM

## 2020-06-21 MED FILL — CYMBALTA 30 MG CAPSULE,DELAYED RELEASE: 30 days supply | Qty: 60 | Fill #5 | Status: AC

## 2020-06-21 MED FILL — CYMBALTA 30 MG CAPSULE,DELAYED RELEASE: ORAL | 30 days supply | Qty: 60 | Fill #5

## 2020-06-21 NOTE — Progress Notes (Signed)
   THERAPIST PROGRESS NOTE  Virtual Visit via Video Note  I connected with Frances [name] on 06/21/20 at  3:00 PM EDT by a video enabled telemedicine application and verified that I am speaking with the correct person using two identifiers.  Location: Patient: Frances [name] Provider: [facility]    I discussed the limitations of evaluation and management by telemedicine and the availability of in person appointments. The patient expressed understanding and agreed to proceed.  Pt reports that she has had decreased energy the past two weeks. Pt states that in previous session she was able to walk to the park 1 to 2 times weekly but recently has not had the desire to such a thing. Frances [name] states " I usually just wake up feed the indoor animals, then the outdoor animal, come inside and spend the rest of my day on the phone". Recently pt has had a stressor with her horse getting sick and only being able to walk on three legs. Although the horse started to feel better 24 hours after pt had notice the original decline this has still caused significant stress in pt life. Pt states "I really need to get out more but just really do not have the motivation".    Frances [name] states that her most regular social interaction is through Ed who is pt previous boyfriend and currently lives on pt property. Pt reports "It has been hard to get my feet under me without much of an income" she does report that a recent SSI application has been completed and is currently out for review.    Assessment: Frances [name] endorses symptoms of anxiety, depression, lack of social interest and decreased energy. Pt continues to have stressors that include social and financials. Pt continues to meet criteria for bipolar disorder, increase GAD, and depression.     Plan: Incorporate journaling so pt has an outlet outside of daily exercise, decrease walking goal to 1 times weekly, continue to take medication as prescribed until provider can  review new onset of symptoms.   I discussed the assessment and treatment plan with the patient. The patient was provided an opportunity to ask questions and all were answered. The patient agreed with the plan and demonstrated an understanding of the instructions.   The patient was advised to call back or seek an in-person evaluation if the symptoms worsen or if the condition fails to improve as anticipated.   I provided 60 minutes of non-face-to-face time during this encounter.   [name], LCSW   Participation Level: Active  Behavioral Response: CasualAlertDysphoric  Type of Therapy: Individual Therapy  Treatment Goals addressed: Diagnosis: bipolar 1  Interventions: CBT and Solution Focused  Summary: Frances [name] is a 44 y.o. female who presents with bipolar disorder.   Suicidal/Homicidal: NAwithout intent/plan   Plan: Return again in 3 weeks.  Diagnosis: Axis I: Bipolar, Depressed       [name], LCSW 06/21/2020

## 2020-06-29 ENCOUNTER — Encounter: Payer: Self-pay | Admitting: Sports Medicine

## 2020-06-29 ENCOUNTER — Ambulatory Visit (INDEPENDENT_AMBULATORY_CARE_PROVIDER_SITE_OTHER): Payer: Self-pay | Admitting: Sports Medicine

## 2020-06-29 ENCOUNTER — Other Ambulatory Visit: Payer: Self-pay

## 2020-06-29 DIAGNOSIS — M5136 Other intervertebral disc degeneration, lumbar region: Secondary | ICD-10-CM

## 2020-06-29 DIAGNOSIS — N62 Hypertrophy of breast: Secondary | ICD-10-CM

## 2020-06-29 DIAGNOSIS — M5030 Other cervical disc degeneration, unspecified cervical region: Secondary | ICD-10-CM

## 2020-06-29 DIAGNOSIS — M51369 Other intervertebral disc degeneration, lumbar region without mention of lumbar back pain or lower extremity pain: Secondary | ICD-10-CM

## 2020-06-29 NOTE — Assessment & Plan Note (Signed)
Large pendulous breasts with intertrigo, shoulder grooving, chronic neck and back pain. I would like her to consider breast reduction surgery as well, she is very interested.[name]

## 2020-06-29 NOTE — Assessment & Plan Note (Signed)
Frances [name] returns, she has chronic discogenic low back pain with multiple small disc protrusions but nothing obviously surgical. She has been Lyrica now, 150 mg twice a day, increasing to 300 mg twice daily. She gets her Lyrica for free from the rheumatologist so I will forward my note. She also has large pendulous breasts, shoulder grooving from the bra strap, intertrigo, and I think a breast reduction would be profoundly beneficial, I am going to get a second opinion from one of our plastic surgeons.

## 2020-06-29 NOTE — Assessment & Plan Note (Signed)
Frances [name] returns, she has chronic neck pain with very small disc protrusions in her cervical spine, nothing that appears surgical, she has been Lyrica now, 150 mg twice a day, increasing to 300 mg twice daily. She gets her Lyrica for free from the rheumatologist so I will forward my note. She also has large pendulous breasts, shoulder grooving from the bra strap, intertrigo, and I think a breast reduction would be profoundly beneficial, I am going to get a second opinion from one of our plastic surgeons.

## 2020-06-29 NOTE — Progress Notes (Signed)
    Procedures performed today:    None.  Independent interpretation of notes and tests performed by another provider:   None.  Brief History, Exam, Impression, and Recommendations:    DDD (degenerative disc disease), cervical Frances [name] returns, she has chronic neck pain with very small disc protrusions in her cervical spine, nothing that appears surgical, she has been Lyrica now, 150 mg twice a day, increasing to 300 mg twice daily. She gets her Lyrica for free from the rheumatologist so I will forward my note. She also has large pendulous breasts, shoulder grooving from the bra strap, intertrigo, and I think a breast reduction would be profoundly beneficial, I am going to get a second opinion from one of our plastic surgeons.  DDD (degenerative disc disease), lumbar Frances [name] returns, she has chronic discogenic low back pain with multiple small disc protrusions but nothing obviously surgical. She has been Lyrica now, 150 mg twice a day, increasing to 300 mg twice daily. She gets her Lyrica for free from the rheumatologist so I will forward my note. She also has large pendulous breasts, shoulder grooving from the bra strap, intertrigo, and I think a breast reduction would be profoundly beneficial, I am going to get a second opinion from one of our plastic surgeons.  Macromastia Large pendulous breasts with intertrigo, shoulder grooving, chronic neck and back pain. I would like her to consider breast reduction surgery as well, she is very interested..    ___________________________________________ Gwen Her. Dianah Field, M.D., ABFM., CAQSM. Primary Care and [place] Instructor of [place] of [facility] of Medicine

## 2020-07-02 ENCOUNTER — Telehealth: Payer: Self-pay | Admitting: *Deleted

## 2020-07-02 NOTE — Telephone Encounter (Signed)
Pt states she continues to have oozing and discomfort from the toe and it does not appear to be healing.

## 2020-07-03 DIAGNOSIS — M797 Fibromyalgia: Principal | ICD-10-CM

## 2020-07-03 MED ORDER — DOXYCYCLINE HYCLATE 100 MG PO CAPS: 100.0000 mg | ORAL_CAPSULE | Freq: Two times a day (BID) | ORAL | 0 refills | Status: DC

## 2020-07-03 MED FILL — PREGABALIN 150 MG CAPSULE: ORAL | 30 days supply | Qty: 60 | Fill #1

## 2020-07-03 MED FILL — PREGABALIN 150 MG CAPSULE: 30 days supply | Qty: 60 | Fill #1 | Status: AC

## 2020-07-03 NOTE — Telephone Encounter (Signed)
I spoke with pt and informed of Dr. Shaune Pollack instructions and request to see her. Pt states the drainage is bloody. I told pt that I would send the doxycycline to the [facility], and I transferred pt to scheduler.

## 2020-07-03 NOTE — Telephone Encounter (Signed)
Yes- I would like to see her  If the fluid is clear- have her soak in epsom salts again if she stopped.  If it is yellow or pus like-  I would like to send her an Rx for Doxycycline 100 mg- 1 tab po bid x 10 days.  (20tabs)  And also see her this Monday or next.    Thanks!!

## 2020-07-09 ENCOUNTER — Ambulatory Visit (INDEPENDENT_AMBULATORY_CARE_PROVIDER_SITE_OTHER): Payer: No Typology Code available for payment source | Admitting: Podiatrist

## 2020-07-09 ENCOUNTER — Encounter: Payer: Self-pay | Admitting: Podiatrist

## 2020-07-09 ENCOUNTER — Other Ambulatory Visit: Payer: Self-pay

## 2020-07-09 DIAGNOSIS — Z9889 Other specified postprocedural states: Secondary | ICD-10-CM

## 2020-07-09 NOTE — Progress Notes (Signed)
   HPI: Patient is 44 y.o. female who presents today for follow up of right ingrown toenail originally removed 03/28/2020.  She relates the pain is better and there is no longer any drainage.  She relates some redness in the medial corner still.    Allergies  Allergen Reactions  . Metaxalone Hives    Review of systems is reviewed and negative.   Physical Exam  Patient is awake, alert, and oriented x 3.  In no acute distress.    Vascular status is intact with palpable pedal pulses DP and PT bilateral and capillary refill time less than 3 seconds bilateral.  No edema or erythema noted.  Neurological exam reveals epicritic and protective sensation grossly intact bilateral.  Dermatological exam reveals skin is supple and dry to bilateral feet.  No open lesions present.   Musculoskeletal exam: Musculature intact with dorsiflexion, plantarflexion, inversion, eversion. Ankle and First MPJ joint range of motion normal.   Right hallux has a minimal area of redness on the proximal medial nail border.  No drainage is expressed. No malodor, no pus or pirulence.  Mild pain with palpation along the medial proximal nail border is elicited.    Assessment:   ICD-10-CM   1. S/P nail surgery  Z98.890      Plan:  recommended soaking in epsom salts and continued use of the cream during the day- leave open to air at night.  It looks to be resolving and I wonder if she may have had a phenol sensitivity that has caused this nail to heal slower.  She does appear to be going in the right direction of healing.  We will follow up in 3 weeks and see how she is doing at that time.

## 2020-07-12 ENCOUNTER — Other Ambulatory Visit: Payer: Self-pay

## 2020-07-12 ENCOUNTER — Ambulatory Visit (INDEPENDENT_AMBULATORY_CARE_PROVIDER_SITE_OTHER): Payer: No Payment, Other | Admitting: Licensed Clinical Social Worker

## 2020-07-12 DIAGNOSIS — F319 Bipolar disorder, unspecified: Secondary | ICD-10-CM | POA: Diagnosis not present

## 2020-07-12 NOTE — Progress Notes (Signed)
° °  THERAPIST PROGRESS NOTE  Virtual Visit via Video Note  I connected with Para March on 07/12/20 at 10:00 AM EDT by a video enabled telemedicine application and verified that I am speaking with the correct person using two identifiers.  Location: Patient: [facility] [name] Provider: [facility]    I discussed the limitations of evaluation and management by telemedicine and the availability of in person appointments. The patient expressed understanding and agreed to proceed.  Subjective: Pt reports recurrent stress with social interaction and personal. [name] has been isolating herself. However, in recent weeks a friend has come back into her life from her past she has reconnected with over Facebook. She states that she is going to see her friend "Ander Purpura" today as pt is going to take her to a procedure.   Pt also endorses stress with her horse she indicated that the horse is limping, and "[name]" is unable to ride her because of it. She will be taking the horse to the veterinary clinic next Tuesday to get checked out. [name] continues to stay busy with activities such as coloring and maintaining her animals.     Objective: Chiquetta Langner was alert and oriented x 5, with flat affect and dressed appropriately/casually and was fairly groomed. She engaged well throughout assessment.      Assessment: Pt endorses symptoms for depression as isolation, sadness, lack of energy. She reports that she has been taking all her medication as directed except for benztropine which she should be taking 2 times daily and currently she is only taking it one time daily. She still meets criteria for bipolar disorder with current episode of depression.     I discussed the assessment and treatment plan with the patient. The patient was provided an opportunity to ask questions and all were answered. The patient agreed with the plan and demonstrated an understanding of the instructions.   The patient was  advised to call back or seek an in-person evaluation if the symptoms worsen or if the condition fails to improve as anticipated.  I provided 50 minutes of non-face-to-face time during this encounter.   [name], LCSW   Participation Level: Active  Behavioral Response: Casual and Fairly GroomedAlertDysphoric  Type of Therapy: Individual Therapy  Treatment Goals addressed: Diagnosis: bipolar 1 disorder   Interventions: CBT and Supportive  Summary: [name] is a 44 y.o. female who presents with bipolar disorder.   Suicidal/Homicidal: NAwithout intent/plan  Plan: Return again in 3 weeks.  Diagnosis: Axis I: Bipolar, Depressed     [name], LCSW 07/12/2020

## 2020-07-16 DIAGNOSIS — M797 Fibromyalgia: Principal | ICD-10-CM

## 2020-07-16 MED ORDER — DULOXETINE 30 MG CAPSULE,DELAYED RELEASE
ORAL_CAPSULE | Freq: Every day | ORAL | 2 refills | 30.00000 days | Status: CP
Start: 2020-07-16 — End: 2020-10-14
  Filled 2020-07-18: qty 60, 30d supply, fill #0

## 2020-07-18 ENCOUNTER — Other Ambulatory Visit: Payer: Self-pay | Admitting: *Deleted

## 2020-07-18 MED ORDER — PREGABALIN 300 MG PO CAPS: 300.0000 mg | ORAL_CAPSULE | Freq: Two times a day (BID) | ORAL | 3 refills | Status: DC

## 2020-07-18 MED FILL — CYMBALTA 30 MG CAPSULE,DELAYED RELEASE: 30 days supply | Qty: 60 | Fill #0 | Status: AC

## 2020-07-24 ENCOUNTER — Telehealth (INDEPENDENT_AMBULATORY_CARE_PROVIDER_SITE_OTHER): Payer: No Payment, Other | Admitting: Psychiatry

## 2020-07-24 ENCOUNTER — Encounter (HOSPITAL_COMMUNITY): Payer: Self-pay | Admitting: Psychiatry

## 2020-07-24 ENCOUNTER — Other Ambulatory Visit: Payer: Self-pay

## 2020-07-24 DIAGNOSIS — F319 Bipolar disorder, unspecified: Secondary | ICD-10-CM

## 2020-07-24 DIAGNOSIS — F3341 Major depressive disorder, recurrent, in partial remission: Secondary | ICD-10-CM

## 2020-07-24 DIAGNOSIS — F411 Generalized anxiety disorder: Secondary | ICD-10-CM | POA: Diagnosis not present

## 2020-07-24 MED ORDER — ARIPIPRAZOLE 15 MG PO TABS: 15.0000 mg | ORAL_TABLET | Freq: Every day | ORAL | 2 refills | Status: DC

## 2020-07-24 MED ORDER — BUPROPION HCL ER (XL) 300 MG PO TB24: 300.0000 mg | ORAL_TABLET | Freq: Every day | ORAL | 2 refills | Status: DC

## 2020-07-24 MED ORDER — BENZTROPINE MESYLATE 0.5 MG PO TABS: 0.5000 mg | ORAL_TABLET | Freq: Every day | ORAL | 2 refills | Status: DC

## 2020-07-24 MED ORDER — TRAZODONE HCL 50 MG PO TABS: 50.0000 mg | ORAL_TABLET | Freq: Every day | ORAL | 2 refills | Status: DC

## 2020-07-24 NOTE — Progress Notes (Signed)
BH MD/PA/NP OP Progress Note Virtual Visit via Video Note  I connected with Para March on 07/24/20 at 11:00 AM EDT by a video enabled telemedicine application and verified that I am speaking with the correct person using two identifiers.  Location: Patient: Home Provider: Clinic   I discussed the limitations of evaluation and management by telemedicine and the availability of in person appointments. The patient expressed understanding and agreed to proceed.  I provided 30 minutes of non-face-to-face time during this encounter.     07/24/2020 11:55 AM Para March  MRN:  [id number]  Chief Complaint: "I'm a little anxious about some personal things"  HPI: 44 year old female seen today for follow up psychiatric evaluation and medication management.  P Patient is currently prescribed Abilify 15 mg HS, Cogentin 0.5 mg daily, Wellbutrin 300 mg daily, trazodone 50HS, and Cymbalta 30 mg (Cymbalta from her rheumatologist).    Today she reports that she is doing well on current medication regiment however notes that she is anxious about a Air traffic controller. Patient informed Probation officer that she was helping a friend our financially. She notes that her friend was supposed to pay her back today however she is unable to because she is in the hospital. She notes this event has caused her some anxiety. At times she notes that she is depressed however request that her medications regimen not be adjusted.   Today she denies distractibility, irritability, grandiosity, flight of ideas, impulsiveness, sleep disturbances, or talkativeness.  She however notes that she is always tired but she relates her tiredness to having fibromyalgia as well as rheumatoid arthritis.  Patient is followed by a rheumatologist. Provider informed patient that she could take half a tablet of trazodone to assist with increased tiredness and sedation. She endorsed understanding however noted that she takes trazodone infrequently.  Today she denies SI/HI/AVH.  She is agreeable to taking medications as prescribed.   No other concerns noted at this time.  Visit Diagnosis:    ICD-10-CM   1. Bipolar I disorder (HCC)  F31.9 ARIPiprazole (ABILIFY) 15 MG tablet    benztropine (COGENTIN) 0.5 MG tablet  2. Recurrent major depressive disorder, in partial remission (HCC)  F33.41 ARIPiprazole (ABILIFY) 15 MG tablet    traZODone (DESYREL) 50 MG tablet  3. Generalized anxiety disorder  F41.1 buPROPion (WELLBUTRIN XL) 300 MG 24 hr tablet    Past Psychiatric History: Bipolar, anxiety, and depression Past Medical History:  Past Medical History:  Diagnosis Date  . Anxiety   . Arthritis   . Back pain, chronic   . Bipolar 1 disorder ([place])   . Chronic fatigue syndrome   . DDD (degenerative disc disease), lumbar   . Depression   . Fibromyalgia   . IUD 2012   Mirena  . MTHFR gene mutation [facility])     Past Surgical History:  Procedure Laterality Date  . COLONOSCOPY WITH PROPOFOL N/A 02/25/2017   Procedure: COLONOSCOPY WITH PROPOFOL;  Surgeon: Arta Silence, MD;  Location: [facility];  Service: Endoscopy;  Laterality: N/A;  . left knee lateral release  1993  . scar tisue removal  03/2005   left ankle  . TONSILLECTOMY  age 33's    Family Psychiatric History: Maternal aunts Bipolar disorder and uncle alcoholic  Family History:  Family History  Problem Relation Age of Onset  . Diabetes Mother   . Stroke Mother   . Lupus Mother   . Hypertension Mother   . Congestive Heart Failure Mother   . Mental illness  Brother        Not clear what his diagnosis is--may be related to previous drug use.  . Cancer Maternal Grandmother        colon  . Depression Maternal Uncle   . Alcohol abuse Maternal Uncle   . Diabetes Maternal Grandfather     Social History:  Social History   Socioeconomic History  . Marital status: Single    Spouse name: Not on file  . Number of children: 0  . Years of education: Not on file  . Highest  education level: Bachelor's degree (e.g., BA, AB, BS)  Occupational History  . Occupation: Designer, industrial/product at times  Tobacco Use  . Smoking status: Never Smoker  . Smokeless tobacco: Never Used  Substance and Sexual Activity  . Alcohol use: No    Alcohol/week: 0.0 standard drinks  . Drug use: No  . Sexual activity: Yes    Birth control/protection: I.U.D.  Other Topics Concern  . Not on file  Social History Narrative   Originally from [place], outside of [place].    Moved here permanently 2012.   Intermittently worked on a farm.   Lives with many cats--3 plus fosters cats.    Single, lives alone in a one story home. Rarely drinks caffeine. 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   Social Determinants of Health   Financial Resource Strain:   . Difficulty of Paying Living Expenses:   Food Insecurity:   . Worried About Charity fundraiser in the Last Year:   . Arboriculturist in the Last Year:   Transportation Needs:   . Film/video editor (Medical):   [name] Lack of Transportation (Non-Medical):   Physical Activity:   . Days of Exercise per Week:   . Minutes of Exercise per Session:   Stress:   . Feeling of Stress :   Social Connections:   . Frequency of Communication with Friends and Family:   . Frequency of Social Gatherings with Friends and Family:   . Attends Religious Services:   . Active Member of Clubs or Organizations:   . Attends Archivist Meetings:   [name] Marital Status:     Allergies:  Allergies  Allergen Reactions  . Metaxalone Hives    Metabolic Disorder Labs: Lab Results  Component Value Date   HGBA1C 5.2 04/14/2018   MPG 103 04/14/2018   No results found for: PROLACTIN Lab Results  Component Value Date   CHOL 130 04/14/2018   TRIG 192 (H) 04/14/2018   HDL 28 (L) 04/14/2018   CHOLHDL 4.6 04/14/2018   LDLCALC 73 04/14/2018   LDLCALC 99 03/04/2016   Lab Results  Component Value Date   TSH 1.616 01/27/2018    TSH 0.967 09/14/2012    Therapeutic Level Labs: No results found for: LITHIUM No results found for: VALPROATE No components found for:  CBMZ  Current Medications: Current Outpatient Medications  Medication Sig Dispense Refill  . ARIPiprazole (ABILIFY) 15 MG tablet Take 1 tablet (15 mg total) by mouth daily. 30 tablet 2  . benztropine (COGENTIN) 0.5 MG tablet Take 1 tablet (0.5 mg total) by mouth daily. 30 tablet 2  . buPROPion (WELLBUTRIN XL) 300 MG 24 hr tablet Take 1 tablet (300 mg total) by mouth daily. 30 tablet 2  . cetirizine (ZYRTEC) 10 MG tablet TAKE 1 Tablet BY MOUTH ONCE DAILY 90 tablet 1  . cholecalciferol (VITAMIN D3) 25 MCG (1000 UT) tablet Take 1,000 Units  by mouth daily.    . diclofenac (VOLTAREN) 75 MG EC tablet TAKE 1 TABLET BY MOUTH TWICE A DAY 60 tablet 3  . doxycycline (VIBRAMYCIN) 100 MG capsule Take 1 capsule (100 mg total) by mouth 2 (two) times daily. 20 capsule 0  . HYDROcodone-acetaminophen (NORCO/VICODIN) 5-325 MG tablet Take 1 tablet by mouth every 6 (six) hours as needed for moderate pain (back pain). 20 tablet 0  . montelukast (SINGULAIR) 10 MG tablet TAKE 1 Tablet BY MOUTH EVERY NIGHT AT BEDTIME 90 tablet 1  . mupirocin ointment (BACTROBAN) 2 % Apply 1 application topically 2 (two) times daily. 30 g 0  . polyethylene glycol (MIRALAX / GLYCOLAX) 17 g packet Take 17 g by mouth daily.    . pregabalin (LYRICA) 300 MG capsule Take 1 capsule (300 mg total) by mouth 2 (two) times daily. 60 capsule 3  . traZODone (DESYREL) 50 MG tablet Take 1 tablet (50 mg total) by mouth at bedtime. 30 tablet 2   No current facility-administered medications for this visit.     Musculoskeletal: Strength & Muscle Tone: Unable to assess due to telehealth visit [place]: Unable to assess due to telehealth visit Patient leans: N/A  Psychiatric Specialty Exam: Review of Systems  There were no vitals taken for this visit.There is no height or weight on file to calculate BMI.   General Appearance: Well Groomed  Eye Contact:  Good  Speech:  Clear and Coherent and Normal Rate  Volume:  Normal  Mood:  Depressed  Affect:  Congruent  Thought Process:  Coherent, Goal Directed and Linear  Orientation:  Full (Time, Place, and Person)  Thought Content: WDL and Logical   Suicidal Thoughts:  No  Homicidal Thoughts:  No  Memory:  Immediate;   Good Recent;   Good Remote;   Good  Judgement:  Good  Insight:  Good  Psychomotor Activity:  Unable to assess due to telehealth visit  Concentration:  Concentration: Good and Attention Span: Good  Recall:  Good  Fund of Knowledge: Good  Language: Good  Akathisia:  No  Handed:  Right  AIMS (if indicated): Not done  Assets:  Communication Skills Desire for Improvement Financial Resources/Insurance Housing Social Support  ADL's:  Intact  Cognition: WNL  Sleep:  Good   Screenings: AIMS     Admission (Discharged) from 01/25/2018 in [facility]  AIMS Total Score 0    AUDIT     Admission (Discharged) from 01/25/2018 in [facility]  Alcohol Use Disorder Identification Test Final Score (AUDIT) 0    GAD-7     Office Visit from 11/28/2019 in [facility] Office Visit from 09/09/2019 in [facility] Visit from 05/11/2019 in [facility] Office Visit from 04/08/2018 in [facility] Visit from 11/03/2017 in [facility]  Total GAD-7 Score 4 2 2 7 8     PHQ2-9     Counselor from 05/24/2020 in [facility] Office Visit from 03/27/2020 in [facility] Office Visit from 11/28/2019 in [facility] Office Visit from 09/09/2019 in [facility] Office Visit from  05/11/2019 in [facility]  PHQ-2 Total Score 2 3 4 3 5   PHQ-9 Total Score  11 13 12 10 11        Assessment and Plan: Patient reports that she is doing well on current medication regimen. She enforces increased anxiety surronding a personal issue and occasional depression.  At this time she does not request medication adjustment.  Patient is agreeable to continue medications as prescribed and requests refills.  Provider sent refills to requested pharmacies.  1. Bipolar I disorder (HCC)  Continue- ARIPiprazole (ABILIFY) 15 MG tablet; Take 1 tablet (15 mg total) by mouth daily.  Dispense: 30 tablet; Refill: 2 Continue- benztropine (COGENTIN) 0.5 MG tablet; Take 1 tablet (0.5 mg total) by mouth daily.  Dispense: 30 tablet; Refill: 2  2. Recurrent major depressive disorder, in partial remission (HCC)  Continue- ARIPiprazole (ABILIFY) 15 MG tablet; Take 1 tablet (15 mg total) by mouth daily.  Dispense: 30 tablet; Refill: 2 Continue- traZODone (DESYREL) 50 MG tablet; Take 1 tablet (50 mg total) by mouth at bedtime.  Dispense: 30 tablet; Refill: 2  3. Generalized anxiety disorder  Continue- buPROPion (WELLBUTRIN XL) 300 MG 24 hr tablet; Take 1 tablet (300 mg total) by mouth daily.  Dispense: 30 tablet; Refill: 2  Follow up in 3 moths  Salley Slaughter, NP 07/24/2020, 11:55 AM

## 2020-07-27 ENCOUNTER — Ambulatory Visit: Payer: Self-pay | Admitting: Sports Medicine

## 2020-07-30 ENCOUNTER — Ambulatory Visit (INDEPENDENT_AMBULATORY_CARE_PROVIDER_SITE_OTHER): Payer: Self-pay | Admitting: Sports Medicine

## 2020-07-30 DIAGNOSIS — M5136 Other intervertebral disc degeneration, lumbar region: Secondary | ICD-10-CM

## 2020-07-30 DIAGNOSIS — M51369 Other intervertebral disc degeneration, lumbar region without mention of lumbar back pain or lower extremity pain: Secondary | ICD-10-CM

## 2020-07-30 NOTE — Assessment & Plan Note (Signed)
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

## 2020-07-30 NOTE — Assessment & Plan Note (Signed)
Frances [name] has persistent discomfort, we increased her Lyrica to 300 mg twice daily, unfortunately she has not had any improvement in her symptoms, she is scheduled to see her plastic surgeon for discussion of breast reduction in September. Because of her persistent axial, discogenic back pain we are going proceed with a right L5-S1 interlaminar epidural. Return to see me 1 month afterwards.

## 2020-07-30 NOTE — Progress Notes (Signed)
    Procedures performed today:    None.  Independent interpretation of notes and tests performed by another provider:   Lumbar spine MRI personally reviewed, multilevel disc protrusions worse at the L5-S1 level.  Brief History, Exam, Impression, and Recommendations:    DDD (degenerative disc disease), lumbar Frances [name] has persistent discomfort, we increased her Lyrica to 300 mg twice daily, unfortunately she has not had any improvement in her symptoms, she is scheduled to see her plastic surgeon for discussion of breast reduction in September. Because of her persistent axial, discogenic back pain we are going proceed with a right L5-S1 interlaminar epidural. Return to see me 1 month afterwards.  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    ___________________________________________ Gwen Her. Dianah Field, M.D., ABFM., CAQSM. Primary Care and [place] Instructor of [employer] [place] [facility]

## 2020-08-03 ENCOUNTER — Other Ambulatory Visit: Payer: Self-pay

## 2020-08-03 ENCOUNTER — Ambulatory Visit
Admission: RE | Admit: 2020-08-03 | Discharge: 2020-08-03 | Disposition: A | Discharge status: Home / Self Care | Payer: No Typology Code available for payment source | Source: Ambulatory Visit | Attending: Sports Medicine | Admitting: Sports Medicine

## 2020-08-03 MED ORDER — METHYLPREDNISOLONE ACETATE 40 MG/ML INJ SUSP (RADIOLOG
120.0000 mg | Freq: Once | INTRAMUSCULAR | Status: AC
  Administered 2020-08-03: 120 mg via EPIDURAL

## 2020-08-03 MED ORDER — IOPAMIDOL (ISOVUE-M 200) INJECTION 41%
1.0000 mL | Freq: Once | INTRAMUSCULAR | Status: AC
  Administered 2020-08-03: 1 mL via EPIDURAL

## 2020-08-03 NOTE — Discharge Instructions (Signed)

## 2020-08-06 ENCOUNTER — Emergency Department (INDEPENDENT_AMBULATORY_CARE_PROVIDER_SITE_OTHER): Payer: No Typology Code available for payment source

## 2020-08-06 ENCOUNTER — Emergency Department (INDEPENDENT_AMBULATORY_CARE_PROVIDER_SITE_OTHER)
Admission: EM | Admit: 2020-08-06 | Discharge: 2020-08-06 | Disposition: A | Discharge status: Home / Self Care | Payer: No Typology Code available for payment source | Source: Home / Self Care

## 2020-08-06 ENCOUNTER — Encounter: Payer: Self-pay | Admitting: Emergency Medicine

## 2020-08-06 ENCOUNTER — Other Ambulatory Visit: Payer: Self-pay

## 2020-08-06 DIAGNOSIS — R1013 Epigastric pain: Secondary | ICD-10-CM

## 2020-08-06 DIAGNOSIS — R0789 Other chest pain: Secondary | ICD-10-CM

## 2020-08-06 LAB — COMPLETE METABOLIC PANEL WITH GFR
AG Ratio: 2 (calc) (ref 1.0–2.5)
ALT: 45 U/L — ABNORMAL HIGH (ref 6–29)
AST: 23 U/L (ref 10–35)
Albumin: 4.5 g/dL (ref 3.6–5.1)
Alkaline phosphatase (APISO): 82 U/L (ref 31–125)
BUN: 17 mg/dL (ref 7–25)
CO2: 29 mmol/L (ref 20–32)
Calcium: 10.1 mg/dL (ref 8.6–10.2)
Chloride: 100 mmol/L (ref 98–110)
Creat: 0.74 mg/dL (ref 0.50–1.10)
GFR, Est African American: 113 mL/min/{1.73_m2} (ref >=60)
GFR, Est Non African American: 97 mL/min/{1.73_m2} (ref >=60)
Globulin: 2.3 g/dL (calc) (ref 1.9–3.7)
Glucose, Bld: 101 mg/dL — ABNORMAL HIGH (ref 65–99)
Potassium: 4.2 mmol/L (ref 3.5–5.3)
Sodium: 138 mmol/L (ref 135–146)
Total Bilirubin: 0.6 mg/dL (ref 0.2–1.2)
Total Protein: 6.8 g/dL (ref 6.1–8.1)

## 2020-08-06 LAB — LIPASE: Lipase: 22 U/L (ref 7–60)

## 2020-08-06 LAB — POCT CBC W AUTO DIFF (K'VILLE URGENT CARE)

## 2020-08-06 NOTE — Discharge Instructions (Signed)
  Your chest x-ray was normal. You will be notified of your labs by tomorrow if anything is abnormal.   You may alternate cool and warm compresses and take acetaminophen and ibuprofen as needed for pain.  Call to schedule a follow up appointment with your primary care provider later this week or next week if not improving.

## 2020-08-06 NOTE — ED Triage Notes (Addendum)
Chest pain x 5 days, sharp, radiates to RT chest Unvaccinated

## 2020-08-06 NOTE — ED Provider Notes (Signed)
[name]    CSN: [id number] Arrival date & time: 08/06/20  0915      History   Chief Complaint Chief Complaint  Patient presents with  . Chest Pain    HPI Frances [name] is a 44 y.o. female.   HPI  Frances [name] is a 44 y.o. female presenting to UC with c/o intermittent lower centralized chest pain over xyphoid process and epigastric region for about 5 days. Sharp in nature with certain movements, last a few seconds at a time.  Pain is 7/10 at worst. Denies difficulty breathing.  No fever, chills, cough or congestion. No known injury. Denies n/v/d.no hx of heart problems. She has not received the Covid-19 vaccine.    Past Medical History:  Diagnosis Date  . Anxiety   . Arthritis   . Back pain, chronic   . Bipolar 1 disorder ([place])   . Chronic fatigue syndrome   . DDD (degenerative disc disease), lumbar   . Depression   . Fibromyalgia   . IUD 2012   Mirena  . MTHFR gene mutation [facility])     Patient Active Problem List   Diagnosis Date Noted  . Macromastia 06/29/2020  . Recurrent major depressive disorder, in partial remission ([place]) 05/24/2020  . Generalized anxiety disorder 05/24/2020  . DDD (degenerative disc disease), cervical 03/13/2020  . Ingrowing left great toenail 03/13/2020  . Labral tear of hip, degenerative 01/11/2020  . Grief reaction 11/28/2019  . Toenail fungus 09/09/2019  . Vasovagal response 09/09/2019  . Decreased visual acuity 08/30/2018  . Right corneal abrasion 08/30/2018  . Elevated fasting glucose 04/08/2018  . Depression   . Anxiety 01/22/2018  . Borderline personality disorder ([place]) 01/22/2018  . Rheumatoid arthritis 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 ([place]) 12/09/2017  . TMJ (temporomandibular joint syndrome) 09/26/2017  . New daily persistent headache 09/26/2017  . MTHFR gene mutation ([place]) 09/20/2017  . Bipolar I disorder ([place]) 09/16/2017  . Fibromyalgia 09/16/2017  . Chronic fatigue syndrome  09/16/2017  . DYSPNEA 10/15/2011  . DERMATITIS, ALLERGIC 06/05/2008  . FATIGUE 05/05/2008  . Hypothyroidism 02/10/2008  . 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 DDD (degenerative disc disease), lumbar 02/10/2008  . FIBROMYALGIA 02/10/2008  . IRON, SERUM, ELEVATED 02/10/2008    Past Surgical History:  Procedure Laterality Date  . COLONOSCOPY WITH PROPOFOL N/A 02/25/2017   Procedure: COLONOSCOPY WITH PROPOFOL;  Surgeon: Arta Silence, MD;  Location: [facility];  Service: Endoscopy;  Laterality: N/A;  . left knee lateral release  1993  . scar tisue removal  03/2005   left ankle  . TONSILLECTOMY  age 51's    OB History    Gravida  0   Para  0   Term  0   Preterm  0   AB  0   Living  0     SAB  0   TAB  0   Ectopic  0   Multiple  0   Live Births               Home Medications    Prior to Admission medications   Medication Sig Start Date End Date Taking? Authorizing Provider  ARIPiprazole (ABILIFY) 15 MG tablet Take 1 tablet (15 mg total) by mouth daily. 07/24/20   Salley Slaughter, NP  benztropine (COGENTIN) 0.5 MG tablet Take 1 tablet (0.5 mg total) by mouth daily. 07/24/20   Salley Slaughter, NP  buPROPion (WELLBUTRIN XL) 300 MG 24 hr tablet Take  1 tablet (300 mg total) by mouth daily. 07/24/20   Salley Slaughter, NP  cetirizine (ZYRTEC) 10 MG tablet TAKE 1 Tablet BY MOUTH ONCE DAILY 06/12/20   Iran Planas L, PA-C  cholecalciferol (VITAMIN D3) 25 MCG (1000 UT) tablet Take 1,000 Units by mouth daily.    [provider]  diclofenac (VOLTAREN) 75 MG EC tablet TAKE 1 TABLET BY MOUTH TWICE A DAY 05/29/20   Silverio Decamp, MD  HYDROcodone-acetaminophen (NORCO/VICODIN) 5-325 MG tablet Take 1 tablet by mouth every 6 (six) hours as needed for moderate pain (back pain). 05/18/20   Breeback, Jade L, PA-C  montelukast (SINGULAIR) 10 MG tablet TAKE 1 Tablet BY MOUTH EVERY NIGHT AT BEDTIME 06/12/20   Breeback, Jade L, PA-C  polyethylene glycol (MIRALAX  / GLYCOLAX) 17 g packet Take 17 g by mouth daily.    [provider]  pregabalin (LYRICA) 300 MG capsule Take 1 capsule (300 mg total) by mouth 2 (two) times daily. 07/18/20   Silverio Decamp, MD  traZODone (DESYREL) 50 MG tablet Take 1 tablet (50 mg total) by mouth at bedtime. 07/24/20   Salley Slaughter, NP    Family History Family History  Problem Relation Age of Onset  . Diabetes Mother   . Stroke Mother   . Lupus Mother   . Hypertension Mother   . Congestive Heart Failure Mother   . Mental illness Brother        Not clear what his diagnosis is--may be related to previous drug use.  . Cancer Maternal Grandmother        colon  . Depression Maternal Uncle   . Alcohol abuse Maternal Uncle   . Diabetes Maternal Grandfather     Social History Social History   Tobacco Use  . Smoking status: Never Smoker  . Smokeless tobacco: Never Used  Vaping Use  . Vaping Use: Never used  Substance Use Topics  . Alcohol use: No    Alcohol/week: 0.0 standard drinks  . Drug use: No     Allergies   Metaxalone   Review of Systems Review of Systems  Constitutional: Negative for chills and fever.  HENT: Negative for congestion, ear pain, sore throat, trouble swallowing and voice change.   Respiratory: Negative for cough and shortness of breath.   Cardiovascular: Positive for chest pain. Negative for palpitations.  Gastrointestinal: Negative for abdominal pain, diarrhea, nausea and vomiting.  Musculoskeletal: Negative for arthralgias, back pain and myalgias.  Skin: Negative for rash.  All other systems reviewed and are negative.    Physical Exam Triage Vital Signs ED Triage Vitals  Enc Vitals Group     BP 08/06/20 0929 111/72     Pulse Rate 08/06/20 0929 97     Resp --      Temp 08/06/20 0929 98.5 F (36.9 C)     Temp Source 08/06/20 0929 Oral     SpO2 08/06/20 0929 96 %     Weight 08/06/20 0931 195 lb (88.5 kg)     Height 08/06/20 0931 5\' 7"  (1.702 m)      Head Circumference --      Peak Flow --      Pain Score 08/06/20 0930 7     Pain Loc --      Pain Edu? --      Excl. in [place]? --    No data found.  Updated Vital Signs BP 111/72 (BP Location: Right Arm)   Pulse 97   Temp  98.5 F (36.9 C) (Oral)   Ht 5\' 7"  (1.702 m)   Wt 195 lb (88.5 kg)   SpO2 96%   BMI 30.54 kg/m   Visual Acuity Right Eye Distance:   Left Eye Distance:   Bilateral Distance:    Right Eye Near:   Left Eye Near:    Bilateral Near:     Physical Exam Vitals and nursing note reviewed.  Constitutional:      General: She is not in acute distress.    Appearance: She is well-developed. She is not ill-appearing, toxic-appearing or diaphoretic.  HENT:     Head: Normocephalic and atraumatic.     Right Ear: Tympanic membrane and ear canal normal.     Left Ear: Tympanic membrane and ear canal normal.     Nose: Nose normal.     Right Sinus: No maxillary sinus tenderness or frontal sinus tenderness.     Left Sinus: No maxillary sinus tenderness or frontal sinus tenderness.     Mouth/Throat:     Lips: Pink.     Mouth: Mucous membranes are moist.     Pharynx: Oropharynx is clear.  Cardiovascular:     Rate and Rhythm: Normal rate and regular rhythm.  Pulmonary:     Effort: Pulmonary effort is normal.     Breath sounds: No decreased breath sounds, wheezing, rhonchi or rales.  Chest:     Chest wall: Tenderness present.    Abdominal:     Palpations: Abdomen is soft.     Tenderness: There is abdominal tenderness (mild epigastric). There is no guarding or rebound.  Musculoskeletal:        General: Normal range of motion.     Cervical back: Normal range of motion.  Skin:    General: Skin is warm and dry.  Neurological:     Mental Status: She is alert and oriented to person, place, and time.  Psychiatric:        Behavior: Behavior normal.      UC Treatments / Results  Labs (all labs ordered are listed, but only abnormal results are displayed) Labs  Reviewed  COMPLETE METABOLIC PANEL WITH GFR - Abnormal; Notable for the following components:      Result Value   Glucose, Bld 101 (*)    ALT 45 (*)    All other components within normal limits  LIPASE  POCT CBC W AUTO DIFF ([place])    EKG   Radiology DG Chest 2 View  Result Date: 08/06/2020 CLINICAL DATA:  Tenderness over xiphoid process EXAM: CHEST - 2 VIEW COMPARISON:  Thoracic spine 05/23/2019 FINDINGS: The heart size and mediastinal contours are within normal limits. Both lungs are clear. The visualized skeletal structures are unremarkable. IMPRESSION: No acute process in the chest. Electronically Signed   By: Macy Mis M.D.   On: 08/06/2020 11:04    Procedures Procedures (including critical care time)  Medications Ordered in UC Medications - No data to display  Initial Impression / Assessment and Plan / UC Course  I have reviewed the triage vital signs and the nursing notes.  Pertinent labs & imaging results that were available during my care of the patient were reviewed by me and considered in my medical decision making (see chart for details).     Chest pain is atypical, discussed EKG, pt has not heart hx, declined EKG at this time. Reassured pt of normal CXR Hx and exam c/w costochondritis CMP and lipase ordered to r/o abdominal cause  of epigastric pain F/u with PCP  Discussed symptoms that warrant emergent care in the ED. AVS given  Final Clinical Impressions(s) / UC Diagnoses   Final diagnoses:  Abdominal pain, epigastric  Atypical chest pain     Discharge Instructions      Your chest x-ray was normal. You will be notified of your labs by tomorrow if anything is abnormal.   You may alternate cool and warm compresses and take acetaminophen and ibuprofen as needed for pain.  Call to schedule a follow up appointment with your primary care provider later this week or next week if not improving.    ED Prescriptions    None      PDMP not reviewed this encounter.   Noe Gens, [place] 08/07/20 1141

## 2020-08-07 ENCOUNTER — Ambulatory Visit: Payer: No Typology Code available for payment source | Admitting: Medical-Surgical

## 2020-08-08 ENCOUNTER — Telehealth (HOSPITAL_COMMUNITY): Payer: Self-pay | Admitting: *Deleted

## 2020-08-08 ENCOUNTER — Ambulatory Visit (INDEPENDENT_AMBULATORY_CARE_PROVIDER_SITE_OTHER): Payer: No Payment, Other | Admitting: Licensed Clinical Social Worker

## 2020-08-08 ENCOUNTER — Other Ambulatory Visit: Payer: Self-pay

## 2020-08-08 DIAGNOSIS — F3341 Major depressive disorder, recurrent, in partial remission: Secondary | ICD-10-CM

## 2020-08-08 DIAGNOSIS — F319 Bipolar disorder, unspecified: Secondary | ICD-10-CM

## 2020-08-08 NOTE — Telephone Encounter (Signed)
Call from patient stating she spoke with her therapist and they suggested she call to express concern she has to her provider that she doesn't like when she makes changes to her medications without discussing it with her and getting her OK to change. Told patient I will take her information and concern and share it with her provider.

## 2020-08-08 NOTE — Progress Notes (Signed)
THERAPIST PROGRESS NOTE  Session Time: 14  Virtual Visit via Video Note  I connected with Frances [name] on 08/08/20 at  1:00 PM EDT by a video enabled telemedicine application and verified that I am speaking with the correct person using two identifiers.  Location: Patient: Frances [name]  Provider: [facility]    I discussed the limitations of evaluation and management by telemedicine and the availability of in person appointments. The patient expressed understanding and agreed to proceed.  Therapist Response:      Subjective/Objective:  Pt was alert and oriented x 5. She was dressed casually and engaged well throughout session. Frances [name] presented today with flat, depressed, and irritable Mood/affect. Her primary stressor for today's visits was frustration with her medication provider due to reported lack of communication. Frances [name] states that she asked provider to go over all changes to her medications and now she went from trazadone 50mg  1-2 x nightly to 50mg  1 x nightly. She states this is the 2nd time she has felt a lack of communication with provider. LCSW intervened by stating that there were options for pt to present her frustration 1st she needed to call the RN/medication line where the RN can send a message to the provider about the changes to her medication. Then based on the provider response the RN can address the matter accordingly. Frances [name] was agreeable to that plan.   She also states that she has been helping an old friend named "Frances [name]". Frances [name] and pt had a fall out many years ago due to [place] drugs use. Frances [name] knew Frances [name] and her mom because this is where she used to board her horses. Lately Frances [name] has been helping with running or errands such as grocery shopping, MD appointments, and picking up pharmacy drugs. Frances [name] states mostly this has been a one-way street with her helping them more than them helping her. This is because Frances [name] feels they have  helped pt in the past with boarding her horses for free.   Other stressors reported are family. Sometime throughout COVID pt mother had passed away. Family had delayed funeral services due to Covid restriction. Pt brother who has the ashes of patients mother decided that he did not want a funeral. But other members of the family were not satisfied with this choice. They went ahead and scheduled a funeral for Sept 11th, 2021. Pt is frustrated because this is in [place] and pt has not been vaccinated yet, due to this she does not feel safe flying during a pandemic. LCSW asked pt to follow up with member of the family that are planning ceremony to see if virtual meeting could be set up for pt to attend.    prescribed and will be following up with medication provider about Trazadone changes. Plan moving forward for therapy will be to Continue coloring [place] daily, walk 2 times weekly for 20 to 25 minutes, and Journal events throughout the day. LCSW help pt schedule further appointment for the month of October and will see pt for next schedule visits for Sept 1st, 2021. Treatment plan was updated for depression goals and objectives.    I discussed the assessment and treatment plan with the patient. The patient was provided an opportunity to ask questions and all were answered. The patient agreed with the plan and demonstrated an understanding of the instructions.   The patient was advised to call back or seek an in-person evaluation if the symptoms worsen or if the condition fails to improve  as anticipated.  I provided 60 minutes of non-face-to-face time during this encounter.   Frances Horn, LCSW   Participation Level: Active  Behavioral Response: CasualAlertAnxious, Irritable and depressed   Type of Therapy: Individual Therapy  Treatment Goals addressed: Diagnosis: bipolar 1 disorder   Interventions: CBT, Supportive and Reframing  Summary: Frances [name] is a 44 y.o. female who  presents with Bipolar 1.   Suicidal/Homicidal: Nowithout intent/plan   Plan: Return again in 08/22/2020.  Diagnosis: Axis I: Bipolar, Depressed   Frances Horn, LCSW 08/08/2020

## 2020-08-09 ENCOUNTER — Ambulatory Visit: Admit: 2020-08-09 | Discharge: 2020-08-10 | Attending: Rheumatology | Primary: Rheumatology

## 2020-08-09 DIAGNOSIS — M79641 Pain in right hand: Principal | ICD-10-CM

## 2020-08-09 DIAGNOSIS — M0600 Rheumatoid arthritis without rheumatoid factor, unspecified site: Principal | ICD-10-CM

## 2020-08-09 DIAGNOSIS — M79642 Pain in left hand: Principal | ICD-10-CM

## 2020-08-13 ENCOUNTER — Ambulatory Visit (INDEPENDENT_AMBULATORY_CARE_PROVIDER_SITE_OTHER): Payer: No Typology Code available for payment source | Admitting: Physician Assistant

## 2020-08-13 ENCOUNTER — Encounter: Payer: Self-pay | Admitting: Physician Assistant

## 2020-08-13 ENCOUNTER — Other Ambulatory Visit: Payer: Self-pay

## 2020-08-13 VITALS — BP 116/79 | HR 94 | Temp 98.4°F | Wt 198.1 lb

## 2020-08-13 DIAGNOSIS — E6609 Other obesity due to excess calories: Secondary | ICD-10-CM | POA: Insufficient documentation

## 2020-08-13 DIAGNOSIS — R0789 Other chest pain: Secondary | ICD-10-CM

## 2020-08-13 DIAGNOSIS — Z6832 Body mass index (BMI) 32.0-32.9, adult: Secondary | ICD-10-CM | POA: Insufficient documentation

## 2020-08-13 DIAGNOSIS — Z6831 Body mass index (BMI) 31.0-31.9, adult: Secondary | ICD-10-CM

## 2020-08-13 DIAGNOSIS — N62 Hypertrophy of breast: Secondary | ICD-10-CM

## 2020-08-13 DIAGNOSIS — Z23 Encounter for immunization: Secondary | ICD-10-CM

## 2020-08-13 MED ORDER — PREDNISONE 20 MG PO TABS: ORAL_TABLET | ORAL | 0 refills | Status: DC

## 2020-08-13 NOTE — Patient Instructions (Signed)
Costochondritis Costochondritis is swelling and irritation (inflammation) of the tissue (cartilage) that connects your ribs to your breastbone (sternum). This causes pain in the front of your chest. Usually, the pain:  Starts gradually.  Is in more than one rib. This condition usually goes away on its own over time. Follow these instructions at home:  Do not do anything that makes your pain worse.  If directed, put ice on the painful area: ? Put ice in a plastic bag. ? Place a towel between your skin and the bag. ? Leave the ice on for 20 minutes, 2-3 times a day.  If directed, put heat on the affected area as often as told by your doctor. Use the heat source that your doctor tells you to use, such as a moist heat pack or a heating pad. ? Place a towel between your skin and the heat source. ? Leave the heat on for 20-30 minutes. ? Take off the heat if your skin turns bright red. This is very important if you cannot feel pain, heat, or cold. You may have a greater risk of getting burned.  Take over-the-counter and prescription medicines only as told by your doctor.  Return to your normal activities as told by your doctor. Ask your doctor what activities are safe for you.  Keep all follow-up visits as told by your doctor. This is important. Contact a doctor if:  You have chills or a fever.  Your pain does not go away or it gets worse.  You have a cough that does not go away. Get help right away if:  You are short of breath. This information is not intended to replace advice given to you by your health care provider. Make sure you discuss any questions you have with your health care provider. Document Revised: 12/23/2017 Document Reviewed: 04/02/2016 Elsevier Patient Education  2020 Elsevier Inc.  

## 2020-08-13 NOTE — Progress Notes (Signed)
Subjective:    Patient ID: Frances [name], female    DOB: 02-21-1976, 44 y.o.   MRN: [id number]  HPI  Pt is a 44 yo female who presents to the clinic with sternum pain for 15 days. Occurred suddenly. She went to UC. Labs were normal, CXR was normal. Declined EKG. Suspected costochondritis suggested ice and tylenol. She takes diclofenac daily. The pain as continued. It is not constant but comes in sharp waves that last seconds to minutes. When occurs rates 7/10. Denies any cough, sinus pressure, ear pain, fever, body aches, SOB. No reflux symptoms. Not worse after eating. lyrica was increased months ago. No new medications. She notices it worse when she is sitting up and coloring. Not worse with exertion. No trauma/injury/heavy lifting.   Pt is struggling with weight gain. She is scheduled to see plastic surgeon for breast reduction.   .. Active Ambulatory Problems    Diagnosis Date Noted  . Hypothyroidism 02/10/2008  . 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 DERMATITIS, ALLERGIC 06/05/2008  . DDD (degenerative disc disease), lumbar 02/10/2008  . FIBROMYALGIA 02/10/2008  . FATIGUE 05/05/2008  . IRON, SERUM, ELEVATED 02/10/2008  . DYSPNEA 10/15/2011  . Bipolar I disorder ([place]) 09/16/2017  . Fibromyalgia 09/16/2017  . Chronic fatigue syndrome 09/16/2017  . MTHFR gene mutation ([place]) 09/20/2017  . TMJ (temporomandibular joint syndrome) 09/26/2017  . New daily persistent headache 09/26/2017  . Rheumatoid arthritis 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 ([place]) 12/09/2017  . Anxiety 01/22/2018  . Borderline personality disorder ([place]) 01/22/2018  . Depression   . Elevated fasting glucose 04/08/2018  . Decreased visual acuity 08/30/2018  . Right corneal abrasion 08/30/2018  . Toenail fungus 09/09/2019  . Vasovagal response 09/09/2019  . Grief reaction 11/28/2019  . Labral tear of hip, degenerative 01/11/2020  . DDD (degenerative disc disease), cervical 03/13/2020  .  Ingrowing left great toenail 03/13/2020  . Recurrent major depressive disorder, in partial remission ([place]) 05/24/2020  . Generalized anxiety disorder 05/24/2020  . Macromastia 06/29/2020  . Xiphoid pain 08/13/2020  . Sternum pain 08/13/2020  . Class 1 obesity due to excess calories without serious comorbidity with body mass index (BMI) of 31.0 to 31.9 in adult 08/13/2020   Resolved Ambulatory Problems    Diagnosis Date Noted  . FEVER, RECURRENT 04/02/2009  . SINUSITIS- ACUTE-NOS 03/24/2008  . URI 01/17/2009  . ANKLE PAIN, BILATERAL 07/28/2008  . BACK PAIN 10/13/2008  . OPEN WOUND FT NO TOE ALONE WITHOUT MENTION COMP 03/28/2009  . Bipolar 1 disorder, depressed, severe ([place]) 01/25/2018  . Chronic right-sided low back pain without sciatica 01/11/2020   Past Medical History:  Diagnosis Date  . Arthritis   . Back pain, chronic   . Bipolar 1 disorder ([place])   . IUD 2012      Review of Systems See HPI.     Objective:   Physical Exam Vitals reviewed.  Constitutional:      Appearance: She is well-developed. She is obese.  HENT:     Head: Normocephalic.  Cardiovascular:     Rate and Rhythm: Normal rate and regular rhythm.     Heart sounds: Normal heart sounds.  Pulmonary:     Effort: Pulmonary effort is normal.     Breath sounds: Normal breath sounds.     Comments: No pain with deep breathing.  Abdominal:     Palpations: Abdomen is soft.     Tenderness: There is no abdominal tenderness. There is no guarding or rebound.  Musculoskeletal:     Comments: Pain to palpation over sternum and xiphoid process.   Neurological:     General: No focal deficit present.     Mental Status: She is alert.  Psychiatric:        Mood and Affect: Mood normal.           Assessment & Plan:  Marland KitchenMarland KitchenJulieana was seen today for chest pain.  Diagnoses and all orders for this visit:  Sternum pain -     EKG 12-Lead -     predniSONE (DELTASONE) 20 MG tablet; Take 3 tablets for 3 days, take 2  tablets for 3 days, take 1 tablet for 3 days, take 1/2 tablet for 4 days.  Need for influenza vaccination -     Flu Vaccine QUAD 6+ mos PF IM (Fluarix Quad PF)  Xiphoid pain -     predniSONE (DELTASONE) 20 MG tablet; Take 3 tablets for 3 days, take 2 tablets for 3 days, take 1 tablet for 3 days, take 1/2 tablet for 4 days.  Macromastia  Class 1 obesity due to excess calories without serious comorbidity with body mass index (BMI) of 31.0 to 31.9 in adult -     Amb Ref to Medical Weight Management   Unclear etiology of symptoms. No known trauma. No recent medication changes. No GERD symptoms. Normal upper abdominal labs. No recent illness or coughing.  EKG-NSR, no arrhthymias, no ST elevation or depression.  She does have large breast which could be pulling on chest.  She denies any recent heavy lifting, pulling, etc.  Will treat with burst of prednisone and then if no improvement consider CT of chest.   Pt is gaining weight and likely some of her medications are not helping. She is going to plastic surgery to consider breast reduction. Referral to medical weight loss clinic with Dr. Leafy Ro.

## 2020-08-15 ENCOUNTER — Ambulatory Visit: Admit: 2020-08-15 | Discharge: 2020-08-16

## 2020-08-15 ENCOUNTER — Encounter: Payer: Self-pay | Admitting: Physician Assistant

## 2020-08-15 DIAGNOSIS — M1990 Unspecified osteoarthritis, unspecified site: Principal | ICD-10-CM

## 2020-08-15 DIAGNOSIS — M797 Fibromyalgia: Principal | ICD-10-CM

## 2020-08-15 MED ORDER — DICLOFENAC SODIUM 75 MG TABLET,DELAYED RELEASE
ORAL_TABLET | Freq: Two times a day (BID) | ORAL | 11 refills | 30.00000 days | Status: CP
Start: 2020-08-15 — End: 2021-08-15
  Filled 2020-08-16: qty 60, 30d supply, fill #0

## 2020-08-15 MED ORDER — PREGABALIN 150 MG CAPSULE
ORAL_CAPSULE | Freq: Two times a day (BID) | ORAL | 3 refills | 30.00000 days | Status: CP
Start: 2020-08-15 — End: ?
  Filled 2020-08-16: qty 120, 30d supply, fill #0

## 2020-08-15 MED ORDER — DULOXETINE 30 MG CAPSULE,DELAYED RELEASE
ORAL_CAPSULE | Freq: Every day | ORAL | 2 refills | 30.00000 days | Status: CP
Start: 2020-08-15 — End: 2020-11-13
  Filled 2020-08-16: qty 60, 30d supply, fill #0

## 2020-08-15 MED ORDER — FAMOTIDINE 40 MG TABLET
ORAL_TABLET | Freq: Every evening | ORAL | 1 refills | 60.00000 days | Status: CP
Start: 2020-08-15 — End: 2021-08-15
  Filled 2020-08-16: qty 60, 60d supply, fill #0

## 2020-08-16 MED FILL — PREGABALIN 150 MG CAPSULE: 30 days supply | Qty: 120 | Fill #0 | Status: AC

## 2020-08-16 MED FILL — DICLOFENAC SODIUM 75 MG TABLET,DELAYED RELEASE: 30 days supply | Qty: 60 | Fill #0 | Status: AC

## 2020-08-16 MED FILL — CYMBALTA 30 MG CAPSULE,DELAYED RELEASE: 30 days supply | Qty: 60 | Fill #0 | Status: AC

## 2020-08-16 MED FILL — FAMOTIDINE 40 MG TABLET: 60 days supply | Qty: 60 | Fill #0 | Status: AC

## 2020-08-22 ENCOUNTER — Ambulatory Visit (INDEPENDENT_AMBULATORY_CARE_PROVIDER_SITE_OTHER): Payer: No Payment, Other | Admitting: Licensed Clinical Social Worker

## 2020-08-22 ENCOUNTER — Other Ambulatory Visit: Payer: Self-pay

## 2020-08-22 DIAGNOSIS — F319 Bipolar disorder, unspecified: Secondary | ICD-10-CM | POA: Diagnosis not present

## 2020-08-22 NOTE — Progress Notes (Signed)
° °  THERAPIST PROGRESS NOTE  Virtual Visit via Telephone Note  I connected with Para March on 08/22/20 at  2:00 PM EDT by telephone and verified that I am speaking with the correct person using two identifiers.  Location: Patient: Frances [name]  Provider: [facility]    I discussed the limitations, risks, security and privacy concerns of performing an evaluation and management service by telephone and the availability of in person appointments. I also discussed with the patient that there may be a patient responsible charge related to this service. The patient expressed understanding and agreed to proceed.  Subjective/objective: Pt was alert and oriented x 5. She was unobserved as connection from video was poor and LCSW and pt decided phone therapy session was best. Frances [name] presented with flat/depressed mood/affect.   Primary stressor currently is family conflict and financials. She currently is struggling trying to get to her mothers funeral. Her mother had passed away in 2023-12-18 due to COVID-19. Frances [name] was delayed at the time and pt brother had decided not to move forward with further plans. But pt Aunts/uncles wanted to move forward with the funeral. Frances [name] is now schedule for September 11th. Pt does not feel safe traveling with COVID-19 on the rise and she is currently not vaccinated. Frances [name] is to follow up with her aunt to see if she can be virtually present during the funeral. LCSW did ask about her father, which she has no contact with as he was not a big part of her life. This was after her parents divorced when she was 18 years old.   Pt  reports that she has recently applied for social security. She has conducted a phone interview and is now awaiting a determination. Pt has been denied before and this has caused an increase in her anxiety trying to get an approval for her application.   Pt does reports that she has been attending her peer support specialty groups some of  which have been conducted at [facility]. She reports groups have been going well, this has been her primary support. She also reports that she walked 4 times last week but has not walk yet this week.  Assessment/plan: Pt endorses symptoms for tension, worry, depression, sadness, irritability, worthlessness & hopelessness. Pt currently meets criteria for bipolar most recent episode of depression. Pt reports she has been taking all her medications. Plan moving forward Pt to attend two peer support meeting weekly, and walk minimum 2 times weekly with the goal of 4 times weekly     I discussed the assessment and treatment plan with the patient. The patient was provided an opportunity to ask questions and all were answered. The patient agreed with the plan and demonstrated an understanding of the instructions.   The patient was advised to call back or seek an in-person evaluation if the symptoms worsen or if the condition fails to improve as anticipated.  I provided 45 minutes of non-face-to-face time during this encounter.   [name], LCSW   Participation Level: Active  Behavioral Response: NAAlertDepressed  Type of Therapy: Individual Therapy  Treatment Goals addressed: Diagnosis: Bipolar disorder  Interventions: Strength-based  Summary: Frances [name] is a 44 y.o. female who presents with bipolar disorder.   Suicidal/Homicidal: NAwithout intent/plan   Plan: Return again in 2 weeks.     [name], LCSW 08/22/2020

## 2020-08-29 ENCOUNTER — Encounter: Payer: Self-pay | Admitting: Physician Assistant

## 2020-08-29 DIAGNOSIS — R059 Cough, unspecified: Secondary | ICD-10-CM

## 2020-08-31 ENCOUNTER — Other Ambulatory Visit: Payer: Self-pay

## 2020-08-31 ENCOUNTER — Other Ambulatory Visit: Payer: Self-pay | Admitting: Physician Assistant

## 2020-08-31 ENCOUNTER — Ambulatory Visit (INDEPENDENT_AMBULATORY_CARE_PROVIDER_SITE_OTHER): Payer: No Typology Code available for payment source

## 2020-08-31 DIAGNOSIS — R05 Cough: Secondary | ICD-10-CM

## 2020-08-31 MED ORDER — PANTOPRAZOLE SODIUM 40 MG PO TBEC
40.0000 mg | DELAYED_RELEASE_TABLET | Freq: Every day | ORAL | 3 refills | Status: DC
Start: 2020-08-31 — End: 2020-12-26

## 2020-08-31 NOTE — Telephone Encounter (Signed)
CT negative for pneumonia or infection. No fractures or cause of pain.  Fatty liver.   Lets start protonix for acid reflux and see if this could be abnormal presentation of GERD.

## 2020-09-04 ENCOUNTER — Other Ambulatory Visit: Payer: Self-pay

## 2020-09-04 ENCOUNTER — Encounter: Payer: Self-pay | Admitting: Plastic Surgery

## 2020-09-04 ENCOUNTER — Ambulatory Visit (INDEPENDENT_AMBULATORY_CARE_PROVIDER_SITE_OTHER): Payer: No Typology Code available for payment source | Admitting: Plastic Surgery

## 2020-09-04 ENCOUNTER — Ambulatory Visit (INDEPENDENT_AMBULATORY_CARE_PROVIDER_SITE_OTHER): Payer: No Typology Code available for payment source | Admitting: Sports Medicine

## 2020-09-04 VITALS — BP 145/96 | HR 109 | Temp 98.5°F | Ht 67.0 in | Wt 204.6 lb

## 2020-09-04 DIAGNOSIS — M5136 Other intervertebral disc degeneration, lumbar region: Secondary | ICD-10-CM

## 2020-09-04 DIAGNOSIS — M25551 Pain in right hip: Secondary | ICD-10-CM

## 2020-09-04 DIAGNOSIS — Z6831 Body mass index (BMI) 31.0-31.9, adult: Secondary | ICD-10-CM

## 2020-09-04 DIAGNOSIS — E6609 Other obesity due to excess calories: Secondary | ICD-10-CM

## 2020-09-04 DIAGNOSIS — F603 Borderline personality disorder: Secondary | ICD-10-CM

## 2020-09-04 DIAGNOSIS — M545 Low back pain, unspecified: Secondary | ICD-10-CM

## 2020-09-04 DIAGNOSIS — M0609 Rheumatoid arthritis without rheumatoid factor, multiple sites: Secondary | ICD-10-CM

## 2020-09-04 DIAGNOSIS — G8929 Other chronic pain: Secondary | ICD-10-CM

## 2020-09-04 DIAGNOSIS — M546 Pain in thoracic spine: Secondary | ICD-10-CM

## 2020-09-04 DIAGNOSIS — N62 Hypertrophy of breast: Secondary | ICD-10-CM | POA: Insufficient documentation

## 2020-09-04 DIAGNOSIS — M51379 Other intervertebral disc degeneration, lumbosacral region without mention of lumbar back pain or lower extremity pain: Secondary | ICD-10-CM

## 2020-09-04 DIAGNOSIS — M51369 Other intervertebral disc degeneration, lumbar region without mention of lumbar back pain or lower extremity pain: Secondary | ICD-10-CM

## 2020-09-04 DIAGNOSIS — F319 Bipolar disorder, unspecified: Secondary | ICD-10-CM

## 2020-09-04 DIAGNOSIS — M5137 Other intervertebral disc degeneration, lumbosacral region: Secondary | ICD-10-CM

## 2020-09-04 MED ORDER — HYDROCODONE-ACETAMINOPHEN 5-325 MG PO TABS: 1.0000 | ORAL_TABLET | Freq: Two times a day (BID) | ORAL | 0 refills | Status: DC | PRN

## 2020-09-04 NOTE — Assessment & Plan Note (Signed)
Frances [name] returns, she is now tapering down her Lyrica, only taking it once a day, she did notice some weight gain which was intolerable. I had also referred her to plastic surgery for consideration of breast reduction, it sounds like this is in the works. She also had a right L5-S1 interlaminar epidural, her lumbar spine MRI showed multilevel disc protrusions. Her pain is predominantly axial, discogenic, she had excellent relief for the first several weeks followed by a return of her pain. For this reason I am going to proceed with lumbar epidurals #2 and #3. If she does not get sufficient long-term relief we will consider surgical referral. I am going to give her a short course of hydrocodone in the meantime. She uses it 1-2 times daily as needed.

## 2020-09-04 NOTE — Progress Notes (Signed)
    Procedures performed today:     Independent interpretation of notes and tests performed by another provider:   None.  Brief History, Exam, Impression, and Recommendations:    DDD (degenerative disc disease), lumbar [name] returns, she is now tapering down her Lyrica, only taking it once a day, she did notice some weight gain which was intolerable. I had also referred her to plastic surgery for consideration of breast reduction, it sounds like this is in the works. She also had a right L5-S1 interlaminar epidural, her lumbar spine MRI showed multilevel disc protrusions. Her pain is predominantly axial, discogenic, she had excellent relief for the first several weeks followed by a return of her pain. For this reason I am going to proceed with lumbar epidurals #2 and #3. If she does not get sufficient long-term relief we will consider surgical referral. I am going to give her a short course of hydrocodone in the meantime. She uses it 1-2 times daily as needed.    ___________________________________________ Gwen Her. Dianah Field, M.D., ABFM., CAQSM. Primary Care and [place] Instructor of [employer] [employer] [facility] of Medicine

## 2020-09-04 NOTE — Progress Notes (Signed)
Patient ID: Frances [name], female    DOB: 1976/04/13, 44 y.o.   MRN: [id number]   Chief Complaint  Patient presents with  . Advice Only    Mammary Hyperplasia: The patient is a 44 y.o. female with a history of mammary hyperplasia for several years.  She has extremely large breasts causing symptoms that include the following: Back pain in the upper and lower back, including neck pain. She pulls or pins her bra straps to provide better lift and relief of the pressure and pain. She notices relief by holding her breast up manually.  Her shoulder straps cause grooves and pain and pressure that requires padding for relief. Pain medication is sometimes required with motrin and tylenol.  Activities that are hindered by enlarged breasts include: exercise and running.  She has tried supportive clothing as well as fitted bras without improvement.  Her breasts are extremely large with the left significantly larger than the right.  She has hyperpigmentation of the inframammary area on both sides.  The sternal to nipple distance on the right is 31 cm and the left is 35 cm.  The IMF distance is 21 cm.  She is 5 feet 7 inches tall and weighs 204 pounds.  BMI = 31.9 kg/m2, BSA = 2.09 m2. Preoperative bra size = patient not sure but likely 40-42 G cup. She would like to be around a C/D cup. The estimated excess breast tissue to be removed at the time of surgery = 625 grams on the left and 625 grams on the right.  Mammogram history: July 2020 and was negative.  Family history of breast cancer: All cousins and maternal aunt.  Tobacco use: No. She does not have a history of diabetes.  She had PT several years ago for lower back pain.  She sees a psychiatrist Dr. Burt Ek and a counselor at an at [phone] for her bipolar and depression.  She finished prednisone 1 week ago for a flare up of back and sternal pain.  The patient follows up with sports medicine for her back pain and her PCP for sternal  pain.   Review of Systems  Constitutional: Positive for activity change. Negative for appetite change.  HENT: Negative.   Eyes: Negative.   Respiratory: Positive for chest tightness. Negative for shortness of breath.   Cardiovascular: Negative for leg swelling.  Gastrointestinal: Negative for abdominal distention and abdominal pain.  Endocrine: Negative.   Genitourinary: Negative.   Musculoskeletal: Positive for back pain and neck pain.  Neurological: Negative.   Hematological: Negative.   Psychiatric/Behavioral: Negative.     Past Medical History:  Diagnosis Date  . Anxiety   . Arthritis   . Back pain, chronic   . Bipolar 1 disorder ([place])   . Chronic fatigue syndrome   . DDD (degenerative disc disease), lumbar   . Depression   . Fibromyalgia   . IUD 2012   Mirena  . MTHFR gene mutation [facility])     Past Surgical History:  Procedure Laterality Date  . COLONOSCOPY WITH PROPOFOL N/A 02/25/2017   Procedure: COLONOSCOPY WITH PROPOFOL;  Surgeon: Arta Silence, MD;  Location: [facility] ENDOSCOPY;  Service: Endoscopy;  Laterality: N/A;  . left knee lateral release  1993  . scar tisue removal  03/2005   left ankle  . TONSILLECTOMY  age 30's      Current Outpatient Medications:  .  ARIPiprazole (ABILIFY) 15 MG tablet, Take 1 tablet (15 mg total) by mouth daily., Disp:  30 tablet, Rfl: 2 .  benztropine (COGENTIN) 0.5 MG tablet, Take 1 tablet (0.5 mg total) by mouth daily., Disp: 30 tablet, Rfl: 2 .  buPROPion (WELLBUTRIN XL) 300 MG 24 hr tablet, Take 1 tablet (300 mg total) by mouth daily., Disp: 30 tablet, Rfl: 2 .  cetirizine (ZYRTEC) 10 MG tablet, TAKE 1 Tablet BY MOUTH ONCE DAILY, Disp: 90 tablet, Rfl: 1 .  cholecalciferol (VITAMIN D3) 25 MCG (1000 UT) tablet, Take 1,000 Units by mouth daily., Disp: , Rfl:  .  diclofenac (VOLTAREN) 75 MG EC tablet, TAKE 1 TABLET BY MOUTH TWICE A DAY, Disp: 60 tablet, Rfl: 3 .  DULoxetine (CYMBALTA) 60 MG capsule, Take 60 mg by mouth daily., Disp: ,  Rfl:  .  famotidine (PEPCID) 20 MG tablet, Take 20 mg by mouth daily., Disp: , Rfl:  .  fluticasone (FLONASE) 50 MCG/ACT nasal spray, Place 2 sprays into both nostrils daily., Disp: , Rfl:  .  HYDROcodone-acetaminophen (NORCO/VICODIN) 5-325 MG tablet, Take 1 tablet by mouth every 6 (six) hours as needed for moderate pain (back pain)., Disp: 20 tablet, Rfl: 0 .  montelukast (SINGULAIR) 10 MG tablet, TAKE 1 Tablet BY MOUTH EVERY NIGHT AT BEDTIME, Disp: 90 tablet, Rfl: 1 .  pantoprazole (PROTONIX) 40 MG tablet, Take 1 tablet (40 mg total) by mouth daily., Disp: 30 tablet, Rfl: 3 .  polyethylene glycol (MIRALAX / GLYCOLAX) 17 g packet, Take 17 g by mouth daily., Disp: , Rfl:  .  traZODone (DESYREL) 50 MG tablet, Take 1 tablet (50 mg total) by mouth at bedtime., Disp: 30 tablet, Rfl: 2   Objective:   Vitals:   09/04/20 1035  BP: (!) 145/96  Pulse: (!) 109  Temp: 98.5 F (36.9 C)  SpO2: 98%    Physical Exam Vitals and nursing note reviewed.  Constitutional:      Appearance: Normal appearance.  HENT:     Head: Normocephalic and atraumatic.  Cardiovascular:     Rate and Rhythm: Normal rate.     Pulses: Normal pulses.  Pulmonary:     Effort: Pulmonary effort is normal. No respiratory distress.     Breath sounds: No wheezing.  Abdominal:     General: Abdomen is flat. There is no distension.     Palpations: There is no mass.  Skin:    General: Skin is warm.     Capillary Refill: Capillary refill takes less than 2 seconds.  Neurological:     General: No focal deficit present.     Mental Status: She is alert and oriented to person, place, and time.  Psychiatric:        Mood and Affect: Mood normal.        Behavior: Behavior normal.        Thought Content: Thought content normal.     Assessment & Plan:  Class 1 obesity due to excess calories without serious comorbidity with body mass index (BMI) of 31.0 to 31.9 in adult  Borderline personality disorder ([place])  Bipolar I disorder  (HCC)  Symptomatic mammary hypertrophy  Chronic bilateral thoracic back pain  Th patient is a very good candidate for breast reduction. We will get her set up for physical therapy for evaluation and treatment. We will have her update her mammogram. I placed a call to her counselor and psychiatrist so that we can arrange for help around the time of the surgery provided we can get coverage.  Make sure that she has all the help that she needs  during that time.  Message left on machine  Pictures were obtained of the patient and placed in the chart with the patient's or guardian's permission.   [place], DO

## 2020-09-04 NOTE — Addendum Note (Signed)
Addended by: Silverio Decamp on: 09/04/2020 02:05 PM   Modules accepted: Orders

## 2020-09-05 ENCOUNTER — Other Ambulatory Visit: Payer: Self-pay | Admitting: Plastic Surgery

## 2020-09-05 ENCOUNTER — Ambulatory Visit (INDEPENDENT_AMBULATORY_CARE_PROVIDER_SITE_OTHER): Payer: No Payment, Other | Admitting: Licensed Clinical Social Worker

## 2020-09-05 DIAGNOSIS — G8929 Other chronic pain: Secondary | ICD-10-CM

## 2020-09-05 DIAGNOSIS — N62 Hypertrophy of breast: Secondary | ICD-10-CM

## 2020-09-05 DIAGNOSIS — F319 Bipolar disorder, unspecified: Secondary | ICD-10-CM

## 2020-09-05 DIAGNOSIS — Z1231 Encounter for screening mammogram for malignant neoplasm of breast: Secondary | ICD-10-CM

## 2020-09-05 DIAGNOSIS — F411 Generalized anxiety disorder: Secondary | ICD-10-CM

## 2020-09-05 NOTE — Progress Notes (Signed)
   THERAPIST PROGRESS NOTE  Virtual Visit via Video Note  I connected with Frances [name] on 09/05/20 at  2:00 PM EDT by a video enabled telemedicine application and verified that I am speaking with the correct person using two identifiers.  Location: Patient: [facility]  Provider: [facility]    I discussed the limitations of evaluation and management by telemedicine and the availability of in person appointments. The patient expressed understanding and agreed to proceed.  Therapist Response:    Subjective/objective: Pt was alert and oriented x 5. She was dressed casually but engaged well as evidence by note below. Pt presented with flat/depressed mood/affect.   Pt report that her primary stressor has been family conflict and illness. Frances [name] states that she has talked to a Psychiatric nurse about a breast reduction. LCSW did receive a call about this from the [facility] health plastic surgeon asking if she had good support for her mental health. Pt provided verbal consent to let the surgeon know about her support. She states that she has not been able to do the activities she normally enjoys doing like riding her horse because of the back pain she currently has. Her providers believe that a breast reduction could help this pain.   Pt has also been stressed about her mother funeral. It was completed over this past weekend. Frances [name] was able to attend via Facebook live. None of her other brothers or sisters attended. This is because they did not approve of a funeral going forward. This has caused pt sadness, tension, anxiety, and worry. Pt feels like they should have been there since a virtual option was provided.  Frances [name] states that she has felt very poorly about her biological father since he left when she was a little girl. Her father went off and started a new family and pt and her sibling with their mother with no contact from him. Pt reports this has caused anger, irritability,  and anxiety.   Assessment/plan:  Pt endorses symptoms of irritability, anxiety, tension, worry, fatigue, sadness, and worry. Pt states that she has been taking all medications as prescribed minus her Lyrica because of weight gain. LCSW recommended she tell her provider before stopping any medications. Currently pt does meet criteria for bipolar disorder currently depressed and GAD. Plan moving forward walk 15 to 20 minutes 3 times per day and journal a list of 5 things she feels toward her biological father. Frances [name] was agreeable to plan.    I discussed the assessment and treatment plan with the patient. The patient was provided an opportunity to ask questions and all were answered. The patient agreed with the plan and demonstrated an understanding of the instructions.   The patient was advised to call back or seek an in-person evaluation if the symptoms worsen or if the condition fails to improve as anticipated.  I provided 45 minutes of non-face-to-face time during this encounter.   [name], LCSW   Participation Level: Active  Behavioral Response: Casual and Fairly GroomedAlertDepressed  Type of Therapy: Individual Therapy  Treatment Goals addressed: Diagnosis: bipolar disorder   Interventions: CBT and Supportive  Summary: Frances [name] is a 44 y.o. female who presents with bipolar disorder currently depressed.   Suicidal/Homicidal: Nowithout intent/plan   Plan: Return again in 2 weeks.     [name], LCSW 09/05/2020

## 2020-09-10 ENCOUNTER — Telehealth: Payer: Self-pay | Admitting: Plastic Surgery

## 2020-09-10 NOTE — Telephone Encounter (Signed)
Called patient to advise that [facility] does not cover breast reduction surgery. This information was confirmed via e-mail with the [place] manager, and re-confirmed via phone call. Ms. Barcelo indicated that her [place] said they would cover it. We will provide the patient with the contact information to the plastic surgery group that we refer to when needed so that she can have that information. If [facility] FA will cover the breast reduction, she can call our office and have any necessary records from our providers sent to [facility]. Patient expressed understanding.

## 2020-09-11 ENCOUNTER — Ambulatory Visit: Payer: No Typology Code available for payment source | Admitting: Rehabilitative and Restorative Service Providers"

## 2020-09-11 ENCOUNTER — Other Ambulatory Visit: Payer: Self-pay

## 2020-09-11 ENCOUNTER — Ambulatory Visit
Admission: RE | Admit: 2020-09-11 | Discharge: 2020-09-11 | Disposition: A | Discharge status: Home / Self Care | Payer: No Typology Code available for payment source | Source: Ambulatory Visit | Attending: Sports Medicine | Admitting: Sports Medicine

## 2020-09-11 DIAGNOSIS — M5136 Other intervertebral disc degeneration, lumbar region: Secondary | ICD-10-CM

## 2020-09-11 MED ORDER — METHYLPREDNISOLONE ACETATE 40 MG/ML INJ SUSP (RADIOLOG
120.0000 mg | Freq: Once | INTRAMUSCULAR | Status: AC
  Administered 2020-09-11: 120 mg via EPIDURAL

## 2020-09-11 MED ORDER — IOPAMIDOL (ISOVUE-M 200) INJECTION 41%
1.0000 mL | Freq: Once | INTRAMUSCULAR | Status: AC
  Administered 2020-09-11: 1 mL via EPIDURAL

## 2020-09-11 NOTE — Discharge Instructions (Signed)

## 2020-09-13 ENCOUNTER — Ambulatory Visit (INDEPENDENT_AMBULATORY_CARE_PROVIDER_SITE_OTHER): Payer: No Typology Code available for payment source

## 2020-09-13 ENCOUNTER — Other Ambulatory Visit: Payer: Self-pay

## 2020-09-13 DIAGNOSIS — Z1231 Encounter for screening mammogram for malignant neoplasm of breast: Secondary | ICD-10-CM

## 2020-09-14 ENCOUNTER — Other Ambulatory Visit: Payer: No Typology Code available for payment source

## 2020-09-16 ENCOUNTER — Emergency Department (INDEPENDENT_AMBULATORY_CARE_PROVIDER_SITE_OTHER)
Admission: EM | Admit: 2020-09-16 | Discharge: 2020-09-16 | Disposition: A | Discharge status: Home / Self Care | Payer: No Typology Code available for payment source | Source: Home / Self Care | Attending: Family Medicine | Admitting: Family Medicine

## 2020-09-16 ENCOUNTER — Other Ambulatory Visit: Payer: Self-pay

## 2020-09-16 DIAGNOSIS — M940 Chondrocostal junction syndrome [Tietze]: Secondary | ICD-10-CM

## 2020-09-16 MED ORDER — PREDNISONE 20 MG PO TABS
ORAL_TABLET | ORAL | 0 refills | Status: DC
Start: 2020-09-16 — End: 2020-10-22

## 2020-09-16 NOTE — ED Triage Notes (Signed)
Pt states that she has some back pain and some left side pain. x2 days. Pt states that she didn't injury it. Pt states that she did receive an injection in her back last Tuesday but she doesn't think that it is related. Pt states that she isn't vaccinated.

## 2020-09-16 NOTE — ED Provider Notes (Signed)
[name]    CSN: [id number] Arrival date & time: 09/16/20  0930      History   Chief Complaint Left side pain.  HPI Frances [name] is a 44 y.o. female.   Patient states that she bent over yesterday to feed her cat and felt sudden left side and back pain that has persisted.  It is worse with chest movement, coughing, and sneezing although she denies cough or recent URI.  She recalls no injury.  She denies rash and fevers, chills, and sweats.  The history is provided by the patient.  Chest Pain Pain location:  L lateral chest and L chest Pain quality: stabbing   Pain radiates to:  Does not radiate Pain severity:  Moderate Onset quality:  Sudden Duration:  1 day Timing:  Constant Progression:  Worsening Chronicity:  New Context: lifting and movement   Relieved by:  Nothing Worsened by:  Coughing, movement and certain positions Ineffective treatments: hydrocodone. Associated symptoms: back pain   Associated symptoms: no abdominal pain, no AICD problem, no anorexia, no cough, no diaphoresis, no dysphagia, no fatigue, no fever, no heartburn, no lower extremity edema, no nausea, no palpitations, no PND and no shortness of breath   Risk factors: obesity     Past Medical History:  Diagnosis Date  . Anxiety   . Arthritis   . Back pain, chronic   . Bipolar 1 disorder ([place])   . Chronic fatigue syndrome   . DDD (degenerative disc disease), lumbar   . Depression   . Fibromyalgia   . IUD 2012   Mirena  . MTHFR gene mutation [facility])     Patient Active Problem List   Diagnosis Date Noted  . Symptomatic mammary hypertrophy 09/04/2020  . Xiphoid pain 08/13/2020  . Sternum pain 08/13/2020  . Class 1 obesity due to excess calories without serious comorbidity with body mass index (BMI) of 31.0 to 31.9 in adult 08/13/2020  . Macromastia 06/29/2020  . Recurrent major depressive disorder, in partial remission ([place]) 05/24/2020  . Generalized anxiety disorder  05/24/2020  . DDD (degenerative disc disease), cervical 03/13/2020  . Ingrowing left great toenail 03/13/2020  . Labral tear of hip, degenerative 01/11/2020  . Grief reaction 11/28/2019  . Toenail fungus 09/09/2019  . Vasovagal response 09/09/2019  . Decreased visual acuity 08/30/2018  . Right corneal abrasion 08/30/2018  . Elevated fasting glucose 04/08/2018  . Depression   . Anxiety 01/22/2018  . Borderline personality disorder ([place]) 01/22/2018  . Rheumatoid arthritis 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 ([place]) 12/09/2017  . TMJ (temporomandibular joint syndrome) 09/26/2017  . New daily persistent headache 09/26/2017  . MTHFR gene mutation ([place]) 09/20/2017  . Bipolar I disorder ([place]) 09/16/2017  . Fibromyalgia 09/16/2017  . Chronic fatigue syndrome 09/16/2017  . DYSPNEA 10/15/2011  . Back pain 10/13/2008  . DERMATITIS, ALLERGIC 06/05/2008  . FATIGUE 05/05/2008  . Hypothyroidism 02/10/2008  . 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 DDD (degenerative disc disease), lumbar 02/10/2008  . FIBROMYALGIA 02/10/2008  . IRON, SERUM, ELEVATED 02/10/2008    Past Surgical History:  Procedure Laterality Date  . COLONOSCOPY WITH PROPOFOL N/A 02/25/2017   Procedure: COLONOSCOPY WITH PROPOFOL;  Surgeon: Arta Silence, MD;  Location: [facility];  Service: Endoscopy;  Laterality: N/A;  . left knee lateral release  1993  . scar tisue removal  03/2005   left ankle  . TONSILLECTOMY  age 69's    OB History    Gravida  0  Para  0   Term  0   Preterm  0   AB  0   Living  0     SAB  0   TAB  0   Ectopic  0   Multiple  0   Live Births               Home Medications    Prior to Admission medications   Medication Sig Start Date End Date Taking? Authorizing Provider  ARIPiprazole (ABILIFY) 15 MG tablet Take 1 tablet (15 mg total) by mouth daily. 07/24/20  Yes Eulis Canner E, NP  benztropine (COGENTIN) 0.5 MG tablet Take 1 tablet (0.5 mg total) by mouth  daily. 07/24/20  Yes Eulis Canner E, NP  buPROPion (WELLBUTRIN XL) 300 MG 24 hr tablet Take 1 tablet (300 mg total) by mouth daily. 07/24/20  Yes Eulis Canner E, NP  cetirizine (ZYRTEC) 10 MG tablet TAKE 1 Tablet BY MOUTH ONCE DAILY 06/12/20  Yes Breeback, Jade L, PA-C  cholecalciferol (VITAMIN D3) 25 MCG (1000 UT) tablet Take 1,000 Units by mouth daily.   Yes [provider]  diclofenac (VOLTAREN) 75 MG EC tablet TAKE 1 TABLET BY MOUTH TWICE A DAY 05/29/20  Yes Silverio Decamp, MD  DULoxetine (CYMBALTA) 60 MG capsule Take 60 mg by mouth daily.   Yes [provider]  famotidine (PEPCID) 20 MG tablet Take 20 mg by mouth daily.   Yes [provider]  fluticasone (FLONASE) 50 MCG/ACT nasal spray Place 2 sprays into both nostrils daily.   Yes [provider]  HYDROcodone-acetaminophen (NORCO/VICODIN) 5-325 MG tablet Take 1 tablet by mouth 2 (two) times daily as needed for moderate pain (back pain). 09/04/20  Yes Silverio Decamp, MD  montelukast (SINGULAIR) 10 MG tablet TAKE 1 Tablet BY MOUTH EVERY NIGHT AT BEDTIME 06/12/20  Yes Breeback, Jade L, PA-C  pantoprazole (PROTONIX) 40 MG tablet Take 1 tablet (40 mg total) by mouth daily. 08/31/20  Yes Breeback, Jade L, PA-C  polyethylene glycol (MIRALAX / GLYCOLAX) 17 g packet Take 17 g by mouth daily.   Yes [provider]  traZODone (DESYREL) 50 MG tablet Take 1 tablet (50 mg total) by mouth at bedtime. 07/24/20  Yes Eulis Canner E, NP  predniSONE (DELTASONE) 20 MG tablet Take one tab by mouth twice daily for 4 days, then one daily for 3 days. Take with food. 09/16/20   Kandra Nicolas, MD    Family History Family History  Problem Relation Age of Onset  . Diabetes Mother   . Stroke Mother   . Lupus Mother   . Hypertension Mother   . Congestive Heart Failure Mother   . Mental illness Brother        Not clear what his diagnosis is--may be related to previous drug use.  . Cancer Maternal  Grandmother        colon  . Depression Maternal Uncle   . Alcohol abuse Maternal Uncle   . Diabetes Maternal Grandfather     Social History Social History   Tobacco Use  . Smoking status: Never Smoker  . Smokeless tobacco: Never Used  Vaping Use  . Vaping Use: Never used  Substance Use Topics  . Alcohol use: No    Alcohol/week: 0.0 standard drinks  . Drug use: No     Allergies   Metaxalone   Review of Systems Review of Systems  Constitutional: Negative for chills, diaphoresis, fatigue and fever.  HENT: Negative for  trouble swallowing.   Respiratory: Negative for cough, chest tightness, shortness of breath, wheezing and stridor.   Cardiovascular: Positive for chest pain. Negative for palpitations, leg swelling and PND.  Gastrointestinal: Negative for abdominal pain, anorexia, heartburn and nausea.  Genitourinary: Negative for dysuria, frequency and hematuria.  Musculoskeletal: Positive for back pain.  All other systems reviewed and are negative.    Physical Exam Triage Vital Signs ED Triage Vitals  Enc Vitals Group     BP 09/16/20 1000 123/84     Pulse Rate 09/16/20 1000 (!) 104     Resp --      Temp 09/16/20 1000 98.5 F (36.9 C)     Temp Source 09/16/20 1000 Oral     SpO2 09/16/20 1000 95 %     Weight 09/16/20 0958 200 lb (90.7 kg)     Height 09/16/20 0958 5\' 7"  (1.702 m)     Head Circumference --      Peak Flow --      Pain Score 09/16/20 0958 8     Pain Loc --      Pain Edu? --      Excl. in [place]? --    No data found.  Updated Vital Signs BP 123/84 (BP Location: Left Arm)   Pulse (!) 104   Temp 98.5 F (36.9 C) (Oral)   Ht 5\' 7"  (1.702 m)   Wt 90.7 kg   LMP 09/09/2020   SpO2 95%   BMI 31.32 kg/m   Visual Acuity Right Eye Distance:   Left Eye Distance:   Bilateral Distance:    Right Eye Near:   Left Eye Near:    Bilateral Near:     Physical Exam Vitals and nursing note reviewed.  Constitutional:      General: She is not in acute  distress.    Appearance: She is obese.  HENT:     Head: Normocephalic.     Nose: Nose normal.     Mouth/Throat:     Pharynx: Oropharynx is clear.  Eyes:     Pupils: Pupils are equal, round, and reactive to light.  Cardiovascular:     Rate and Rhythm: Regular rhythm. Tachycardia present.     Heart sounds: Normal heart sounds.  Pulmonary:     Breath sounds: Normal breath sounds. No wheezing, rhonchi or rales.  Chest:     Chest wall: Tenderness present.  Abdominal:     Palpations: Abdomen is soft.     Tenderness: There is no abdominal tenderness.  Musculoskeletal:        General: No tenderness.     Right lower leg: No edema.     Left lower leg: No edema.  Skin:    General: Skin is warm and dry.     Findings: No rash.          Comments: Left chest:  Distinct tenderness to palpation from mid-sternum to left lateral lower ribs as noted on diagram.   Neurological:     Mental Status: She is alert and oriented to person, place, and time.      UC Treatments / Results  Labs (all labs ordered are listed, but only abnormal results are displayed) Labs Reviewed - No data to display  EKG   Radiology No results found.  Procedures Procedures (including critical care time)  Medications Ordered in UC Medications - No data to display  Initial Impression / Assessment and Plan / UC Course  I have reviewed the triage vital signs  and the nursing notes.  Pertinent labs & imaging results that were available during my care of the patient were reviewed by me and considered in my medical decision making (see chart for details).    Review of previous records reveals that patient had a negative chest x-ray on 08/06/20, and a negative CT chest on 08/31/20. Begin prednisone burst/taper. Followup with Dr. Aundria Mems ([facility]) if not improving about two weeks.    Final Clinical Impressions(s) / UC Diagnoses   Final diagnoses:  Costochondritis     Discharge  Instructions     Apply ice pack for 20 to 30 minutes, 3 to 4 times daily  Continue until pain decreases.     ED Prescriptions    Medication Sig Dispense Auth. Provider   predniSONE (DELTASONE) 20 MG tablet Take one tab by mouth twice daily for 4 days, then one daily for 3 days. Take with food. 11 tablet Kandra Nicolas, MD        Kandra Nicolas, MD 09/19/20 2029

## 2020-09-16 NOTE — Discharge Instructions (Addendum)
Apply ice pack for 20 to 30 minutes, 3 to 4 times daily  Continue until pain decreases.  °

## 2020-09-17 MED FILL — DICLOFENAC SODIUM 75 MG TABLET,DELAYED RELEASE: 30 days supply | Qty: 60 | Fill #1 | Status: AC

## 2020-09-17 MED FILL — CYMBALTA 30 MG CAPSULE,DELAYED RELEASE: ORAL | 30 days supply | Qty: 60 | Fill #1

## 2020-09-17 MED FILL — DICLOFENAC SODIUM 75 MG TABLET,DELAYED RELEASE: ORAL | 30 days supply | Qty: 60 | Fill #1

## 2020-09-17 MED FILL — CYMBALTA 30 MG CAPSULE,DELAYED RELEASE: 30 days supply | Qty: 60 | Fill #1 | Status: AC

## 2020-09-18 ENCOUNTER — Encounter: Payer: No Typology Code available for payment source | Admitting: Rehabilitative and Restorative Service Providers"

## 2020-09-19 ENCOUNTER — Other Ambulatory Visit: Payer: Self-pay

## 2020-09-19 ENCOUNTER — Ambulatory Visit (HOSPITAL_COMMUNITY): Payer: No Payment, Other | Admitting: Licensed Clinical Social Worker

## 2020-09-19 DIAGNOSIS — F319 Bipolar disorder, unspecified: Secondary | ICD-10-CM

## 2020-09-19 DIAGNOSIS — F603 Borderline personality disorder: Secondary | ICD-10-CM

## 2020-09-19 NOTE — Progress Notes (Signed)
   THERAPIST PROGRESS NOTE  Virtual Visit via Video Note  I connected with Para March on 09/19/20 at  2:00 PM EDT by a video enabled telemedicine application and verified that I am speaking with the correct person using two identifiers.  Location: Patient: [facility]  Provider: [facility]    I discussed the limitations of evaluation and management by telemedicine and the availability of in person appointments. The patient expressed understanding and agreed to proceed.  Therapist Response:    Subjective/Objective:  Pt was alert and oriented x 5. She was dressed casually and engaged well throughout therapy and evidence by note below. She presented today with negative & depressed mood/affect.   Pt reports her primary stressors are illness, family conflict, and financial. Bela recently got a letter denying her SSDI. She does have a form of representation to help her through the appeals process which Everlee is currently filling out paperwork for. She has been denied at least 2 other times for similar reasons.   Pt still is dealing with unprocessed family conflict. Her biological father divorced her mother was pt was very young. Corby still feels resentment toward her father not for leaving, but for starting a new family. She reports "It makes me feel like we were not good enough for him". Tyese states that she is the only one that really had these feeling about her father. Pt brother, sister, and mother did not feel the same way. Ludivina attribute this to her being much younger than they were when he had left the family.   She also has been denied her surgery for "Breast Reduction" this is because  financial assistance does not feel it is medically necessary at this time. Kilyn is applying for the same surgery at [facility]. She is awaiting that determination.    Assessment/Plan: Pt endorse symptoms for depression as sadness, worthlessness,  hopelessness, and irritability. She does meet criteria for bipolar disorder currently depressed. She has been taking her medications as prescribed she states. Plan moving forward write 3 to 5 positive affirmation words to present for next session.      I discussed the assessment and treatment plan with the patient. The patient was provided an opportunity to ask questions and all were answered. The patient agreed with the plan and demonstrated an understanding of the instructions.   The patient was advised to call back or seek an in-person evaluation if the symptoms worsen or if the condition fails to improve as anticipated.  I provided 45 minutes of non-face-to-face time during this encounter.   [name], LCSW   Participation Level: Active  Behavioral Response: CasualAlertNegative and Depressed  Type of Therapy: Individual Therapy  Treatment Goals addressed: Diagnosis: bipolar depression   Interventions: CBT and Supportive  Summary: SEQUOIA WITZ is a 44 y.o. female who presents with bipolar 1 disorder currently depressed.   Suicidal/Homicidal: Nowithout intent/plan   Plan: Return again in 2 weeks.     [name], LCSW 09/19/2020

## 2020-10-04 ENCOUNTER — Ambulatory Visit (INDEPENDENT_AMBULATORY_CARE_PROVIDER_SITE_OTHER): Payer: No Payment, Other | Admitting: Licensed Clinical Social Worker

## 2020-10-04 ENCOUNTER — Other Ambulatory Visit: Payer: Self-pay

## 2020-10-04 DIAGNOSIS — F319 Bipolar disorder, unspecified: Secondary | ICD-10-CM | POA: Diagnosis not present

## 2020-10-04 DIAGNOSIS — F411 Generalized anxiety disorder: Secondary | ICD-10-CM

## 2020-10-04 NOTE — Progress Notes (Signed)
THERAPIST PROGRESS NOTE  Virtual Visit via Video Note  I connected with Para March on 10/04/20 at  2:00 PM EDT by a video enabled telemedicine application and verified that I am speaking with the correct person using two identifiers.  Location: Patient: [facility]  Provider: [facility]   I discussed the limitations of evaluation and management by telemedicine and the availability of in person appointments. The patient expressed understanding and agreed to proceed.  Therapist Response:    Subjective/Objective:  Pt was alert and oriented x 5. She was dressed casually and engaged well as evidence by note below. She presented today with flat/depressed mood & affect.   Pt presented today very irritable. When LCSW asked about her medications she states, "Dr. Ronne Binning still has not changed my medications back to 1 time daily". Pt was supposed to call the RN line and get clarification. Luverne states that she did call but never got a call back. LCSW asked why she did not f/u after she made the original call. Pt states "Because it is not a big deal". LCSW stated to pt this is her 3rd time bringing this same issue up in session and that it seemed to be affecting her. Pt was not happy that LCSW confronted her as evidence by short 1-word answers after that such as "good, fine, alright" to open ended questions.   LCSW spoke with pt about short answers and how he would like the pt to engaged in therapy. Anyae did respond well after that. Going on to explain how she rode her horse 3 time this past week and how she has been attending community groups two times per week. She also has been helping her friends find a new place to live as they are not renewing their lease. This has had pt driving to different apartment complexes to help find new housing for them.   At the end of session LCSW discussed scheduling with pt. LCSW stated we needed to schedule her 1 more appointment as she is  only schedule out 1 and per policy [facility] can now schedule out 2. Pt states "You said three last time". LCSW reported to pt that she had three appointment scheduled out on 9/29 her last visit and 1 needed to be canceled to comply with policy of 2.  Pt again confronted LCSW by stating "I know what I heard". Pt became irritable and angry with LCSW. LCSW did agree to provide pt with 3 advanced appointments     Assessment/plan: Plan/Assessment: Pt endorse symptoms for irritability, sadness, lack of concertation, & fatigue. Pt also has anxiety symptoms for tension & worry. She does meet criteria for Bipolar 1 currently depressed. Plan moving forward pt to write down what she is depressed about and rank it by severity. Pt will also be attended support groups 2 x weekly and ride her horse 1 x weekly.     I discussed the assessment and treatment plan with the patient. The patient was provided an opportunity to ask questions and all were answered. The patient agreed with the plan and demonstrated an understanding of the instructions.   The patient was advised to call back or seek an in-person evaluation if the symptoms worsen or if the condition fails to improve as anticipated.  I provided 45 minutes of non-face-to-face time during this encounter.   [name], LCSW    Participation Level: Active  Behavioral Response: CasualAlertflat  Type of Therapy: Individual Therapy  Treatment Goals  addressed: Diagnosis: bipolar 1 depression   Interventions: Supportive  Summary: TRULY STANKIEWICZ is a 44 y.o. female who presents with bipolar 1 depression.   Suicidal/Homicidal: Nowithout intent/plan   Plan: Return again in 4 weeks.    [name], LCSW 10/04/2020

## 2020-10-15 MED FILL — DICLOFENAC SODIUM 75 MG TABLET,DELAYED RELEASE: ORAL | 30 days supply | Qty: 60 | Fill #2

## 2020-10-15 MED FILL — CYMBALTA 30 MG CAPSULE,DELAYED RELEASE: ORAL | 30 days supply | Qty: 60 | Fill #2

## 2020-10-15 MED FILL — FAMOTIDINE 40 MG TABLET: 60 days supply | Qty: 60 | Fill #1 | Status: AC

## 2020-10-15 MED FILL — FAMOTIDINE 40 MG TABLET: ORAL | 60 days supply | Qty: 60 | Fill #1

## 2020-10-15 MED FILL — DICLOFENAC SODIUM 75 MG TABLET,DELAYED RELEASE: 30 days supply | Qty: 60 | Fill #2 | Status: AC

## 2020-10-15 MED FILL — CYMBALTA 30 MG CAPSULE,DELAYED RELEASE: 30 days supply | Qty: 60 | Fill #2 | Status: AC

## 2020-10-18 ENCOUNTER — Ambulatory Visit (HOSPITAL_COMMUNITY): Payer: Self-pay | Admitting: Licensed Clinical Social Worker

## 2020-10-19 ENCOUNTER — Encounter: Payer: Self-pay | Admitting: Physician Assistant

## 2020-10-22 ENCOUNTER — Encounter: Payer: Self-pay | Admitting: Physician Assistant

## 2020-10-22 ENCOUNTER — Ambulatory Visit (INDEPENDENT_AMBULATORY_CARE_PROVIDER_SITE_OTHER): Payer: No Typology Code available for payment source | Admitting: Physician Assistant

## 2020-10-22 VITALS — BP 136/87 | HR 125 | Temp 98.9°F | Wt 204.1 lb

## 2020-10-22 DIAGNOSIS — R0781 Pleurodynia: Secondary | ICD-10-CM | POA: Insufficient documentation

## 2020-10-22 DIAGNOSIS — R000 Tachycardia, unspecified: Secondary | ICD-10-CM | POA: Insufficient documentation

## 2020-10-22 DIAGNOSIS — M940 Chondrocostal junction syndrome [Tietze]: Secondary | ICD-10-CM | POA: Insufficient documentation

## 2020-10-22 DIAGNOSIS — N62 Hypertrophy of breast: Secondary | ICD-10-CM

## 2020-10-22 DIAGNOSIS — R160 Hepatomegaly, not elsewhere classified: Secondary | ICD-10-CM | POA: Insufficient documentation

## 2020-10-22 MED ORDER — KETOROLAC TROMETHAMINE 30 MG/ML IJ SOLN
30.0000 mg | Freq: Once | INTRAMUSCULAR | Status: AC
  Administered 2020-10-22: 30 mg via INTRAMUSCULAR

## 2020-10-22 MED ORDER — METHOCARBAMOL 500 MG PO TABS: 500.0000 mg | ORAL_TABLET | Freq: Three times a day (TID) | ORAL | 0 refills | Status: DC

## 2020-10-22 MED ORDER — GABAPENTIN 100 MG PO CAPS: 100.0000 mg | ORAL_CAPSULE | Freq: Three times a day (TID) | ORAL | 1 refills | Status: DC

## 2020-10-22 NOTE — Progress Notes (Signed)
Subjective:    Patient ID: Para March, female    DOB: 09/21/1976, 44 y.o.   MRN: [id number]  HPI  Patient is a 44 year old female with recent upper chest pain that has been moving throughout her chest.  Started with sternal pain that moved into left chest wall and flank pain that moved into right rib/flank pain.  She is seen in urgent care twice and diagnosed with costochondritis.  Last time she was given prednisone in the left sided pain resolved but moved to the right.  Pain is worse with any movement.  At times with movement she would rated 8 out of 10.  At rest it is 2 or 3 out of 10.  No pain with deep breathing.  Patient denies any productive cough, congestion, sinus pressure, fever, chills, shortness of breath, nausea, vomiting, urinary symptoms. her symptoms are not worsened by food or drink.  She denies any abdominal pain.  She denies any constipation or diarrhea as well as melena or hematochezia.  Patient does have a large bilateral breast.  2 Vicodin is only thing that has been helping with pain.  Patient denies any recent pushing, pulling, lifting injury.  Patient's heart rate is elevated today.  History of heart rate in the 80s to 90s.  Patient did stop Lyrica due to weight gain and not thinking it was significantly helping.   .. Active Ambulatory Problems    Diagnosis Date Noted  . Hypothyroidism 02/10/2008  . 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 DERMATITIS, ALLERGIC 06/05/2008  . DDD (degenerative disc disease), lumbar 02/10/2008  . Back pain 10/13/2008  . FIBROMYALGIA 02/10/2008  . FATIGUE 05/05/2008  . IRON, SERUM, ELEVATED 02/10/2008  . DYSPNEA 10/15/2011  . Bipolar I disorder ([place]) 09/16/2017  . Fibromyalgia 09/16/2017  . Chronic fatigue syndrome 09/16/2017  . MTHFR gene mutation 09/20/2017  . TMJ (temporomandibular joint syndrome) 09/26/2017  . New daily persistent headache 09/26/2017  . Rheumatoid arthritis of multiple sites with negative rheumatoid  factor ([place]) 12/09/2017  . Anxiety 01/22/2018  . Borderline personality disorder ([place]) 01/22/2018  . Depression   . Elevated fasting glucose 04/08/2018  . Decreased visual acuity 08/30/2018  . Right corneal abrasion 08/30/2018  . Toenail fungus 09/09/2019  . Vasovagal response 09/09/2019  . Grief reaction 11/28/2019  . Labral tear of hip, degenerative 01/11/2020  . DDD (degenerative disc disease), cervical 03/13/2020  . Ingrowing left great toenail 03/13/2020  . Recurrent major depressive disorder, in partial remission ([place]) 05/24/2020  . Generalized anxiety disorder 05/24/2020  . Macromastia 06/29/2020  . Xiphoid pain 08/13/2020  . Sternum pain 08/13/2020  . Class 1 obesity due to excess calories without serious comorbidity with body mass index (BMI) of 31.0 to 31.9 in adult 08/13/2020  . Symptomatic mammary hypertrophy 09/04/2020  . Costochondritis 10/22/2020  . Rib pain on right side 10/22/2020  . Tachycardia 10/22/2020  . Hepatomegaly 10/22/2020   Resolved Ambulatory Problems    Diagnosis Date Noted  . FEVER, RECURRENT 04/02/2009  . SINUSITIS- ACUTE-NOS 03/24/2008  . URI 01/17/2009  . ANKLE PAIN, BILATERAL 07/28/2008  . OPEN WOUND FT NO TOE ALONE WITHOUT MENTION COMP 03/28/2009  . Bipolar 1 disorder, depressed, severe ([place]) 01/25/2018  . Chronic right-sided low back pain without sciatica 01/11/2020   Past Medical History:  Diagnosis Date  . Arthritis   . Back pain, chronic   . Bipolar 1 disorder ([place])   . IUD 2012     Review of Systems     Objective:  Physical Exam Vitals reviewed.  Constitutional:      Appearance: Normal appearance. She is obese.  HENT:     Head: Normocephalic.  Cardiovascular:     Rate and Rhythm: Normal rate and regular rhythm.     Pulses: Normal pulses.     Heart sounds: Normal heart sounds.  Pulmonary:     Effort: Pulmonary effort is normal.     Breath sounds: Normal breath sounds.     Comments: Large bilateral breast Tenderness  to palpation over lower ribs on right side.  Abdominal:     General: Bowel sounds are normal. There is no distension.     Palpations: Abdomen is soft.     Tenderness: There is no abdominal tenderness. There is no right CVA tenderness, left CVA tenderness, guarding or rebound.     Comments: Hepatomegaly.   Musculoskeletal:     Cervical back: Normal range of motion and neck supple.     Right lower leg: No edema.     Left lower leg: No edema.  Lymphadenopathy:     Cervical: No cervical adenopathy.  Neurological:     General: No focal deficit present.     Mental Status: She is alert and oriented to person, place, and time.  Psychiatric:        Mood and Affect: Mood normal.        Behavior: Behavior normal.           Assessment & Plan:  Marland KitchenMarland KitchenJimma was seen today for flank pain.  Diagnoses and all orders for this visit:  Rib pain on right side -     gabapentin (NEURONTIN) 100 MG capsule; Take 1 capsule (100 mg total) by mouth 3 (three) times daily. -     COMPLETE METABOLIC PANEL WITH GFR -     CBC with Differential/Platelet -     methocarbamol (ROBAXIN) 500 MG tablet; Take 1 tablet (500 mg total) by mouth 3 (three) times daily. -     ketorolac (TORADOL) 30 MG/ML injection 30 mg  Tachycardia -     EKG 12-Lead -     CBC with Differential/Platelet  Large breasts  Costochondritis -     gabapentin (NEURONTIN) 100 MG capsule; Take 1 capsule (100 mg total) by mouth 3 (three) times daily. -     methocarbamol (ROBAXIN) 500 MG tablet; Take 1 tablet (500 mg total) by mouth 3 (three) times daily. -     ketorolac (TORADOL) 30 MG/ML injection 30 mg  Hepatomegaly   EKG shows elevated HR but no PVC or arrhthymias or ST elevation or depression. Elevated HR could be due to pain. Reassurance given.   Unclear etiology of pain that seems to radiate throughout upper body. It does seem consistent with costochondritis. Left side pain resolved and now having right side pain. No digestive  symptoms. Pain with movement. Concern the pain is coming from large breast pulling on right and left chest wall and flank. CT of chest done 4 weeks ago. Ice area.   Toradol given today IM. Hold NSAId for today. Ok to start back tomorrow. Added gabapentin and robaxin. Follow up with Dr. Darene Lamer. We could consider increasing gabapentin significantly to get to therapeutic dose.

## 2020-10-23 ENCOUNTER — Encounter: Payer: Self-pay | Admitting: Physician Assistant

## 2020-10-24 ENCOUNTER — Telehealth (INDEPENDENT_AMBULATORY_CARE_PROVIDER_SITE_OTHER): Payer: No Payment, Other | Admitting: Psychiatry

## 2020-10-24 ENCOUNTER — Encounter (HOSPITAL_COMMUNITY): Payer: Self-pay | Admitting: Psychiatry

## 2020-10-24 ENCOUNTER — Other Ambulatory Visit: Payer: Self-pay

## 2020-10-24 DIAGNOSIS — F3341 Major depressive disorder, recurrent, in partial remission: Secondary | ICD-10-CM | POA: Diagnosis not present

## 2020-10-24 DIAGNOSIS — F411 Generalized anxiety disorder: Secondary | ICD-10-CM

## 2020-10-24 DIAGNOSIS — F319 Bipolar disorder, unspecified: Secondary | ICD-10-CM

## 2020-10-24 MED ORDER — BUPROPION HCL ER (XL) 300 MG PO TB24: 300.0000 mg | ORAL_TABLET | Freq: Every day | ORAL | 2 refills | Status: DC

## 2020-10-24 MED ORDER — TRAZODONE HCL 50 MG PO TABS: 50.0000 mg | ORAL_TABLET | Freq: Every day | ORAL | 2 refills | Status: DC

## 2020-10-24 MED ORDER — ARIPIPRAZOLE 15 MG PO TABS: 15.0000 mg | ORAL_TABLET | Freq: Every day | ORAL | 2 refills | Status: DC

## 2020-10-24 MED ORDER — BENZTROPINE MESYLATE 0.5 MG PO TABS: 0.5000 mg | ORAL_TABLET | Freq: Every day | ORAL | 2 refills | Status: DC

## 2020-10-24 NOTE — Progress Notes (Signed)
BH MD/PA/NP OP Progress Note Virtual Visit via Video Note  I connected with Frances [name] on 10/24/20 at 10:00 AM EDT by a video enabled telemedicine application and verified that I am speaking with the correct person using two identifiers.  Location: Patient: Home Provider: Clinic   I discussed the limitations of evaluation and management by telemedicine and the availability of in person appointments. The patient expressed understanding and agreed to proceed.  I provided 30 minutes of non-face-to-face time during this encounter.     10/24/2020 10:09 AM Frances [name]  MRN:  [id number]  Chief Complaint: ""  HPI: 44 year old female seen today for follow up psychiatric evaluation. Patient has a psychiatric history of bipolar 1, borderline personality disorder, depression, and anxiety. She is currently managed on Abilify 15 mg nightly, Cogentin 0.5 mg daily, Wellbutrin 300 mg daily, trazodone 50-100 mg nightly, and Cymbalta 30 mg daily (from rheumatologist), and gabapentin 100 mg three times daily (from rheumatologist which she recently started for fibromyalgia). Today, patient notes that medications are effective in managing her symptoms.   Today, the patient is well groomed, irritable, cooperative, and engaged in conversation. Patient described mood as anxious last week but better this week. A PHQ-9 on was conducted  10/04/3020 by therapist and patient scored a 54. A GAD-7 was also conducted by therapist  and patient scored 3. Patient informed provider that she did not want to do more screenings today.  SHe denies SI/HI/VAH/paranoia.   Patient informed provider that she did not want her trazodone reduced at last visit. Provider endosed understanding and noted that the medication would be readjusted. She takes 100 mg of trazodone nightly PRN but prefers the smaller 50 mg pills.  The patient is agreeable to continuing current medications. Patient will follow up with outpatient  counselor for therapy. No other concerns noted at this time.   Visit Diagnosis:    ICD-10-CM   1. Bipolar I disorder (HCC)  F31.9 ARIPiprazole (ABILIFY) 15 MG tablet    benztropine (COGENTIN) 0.5 MG tablet  2. Recurrent major depressive disorder, in partial remission (HCC)  F33.41 ARIPiprazole (ABILIFY) 15 MG tablet    traZODone (DESYREL) 50 MG tablet  3. Generalized anxiety disorder  F41.1 buPROPion (WELLBUTRIN XL) 300 MG 24 hr tablet    Past Psychiatric History: Bipolar, anxiety, and depression Past Medical History:  Past Medical History:  Diagnosis Date  . Anxiety   . Arthritis   . Back pain, chronic   . Bipolar 1 disorder ([place])   . Chronic fatigue syndrome   . DDD (degenerative disc disease), lumbar   . Depression   . Fibromyalgia   . IUD 2012   Mirena  . MTHFR gene mutation     Past Surgical History:  Procedure Laterality Date  . COLONOSCOPY WITH PROPOFOL N/A 02/25/2017   Procedure: COLONOSCOPY WITH PROPOFOL;  Surgeon: Arta Silence, MD;  Location: [facility];  Service: Endoscopy;  Laterality: N/A;  . left knee lateral release  1993  . scar tisue removal  03/2005   left ankle  . TONSILLECTOMY  age 76's    Family Psychiatric History: Maternal aunts Bipolar disorder and uncle alcoholic  Family History:  Family History  Problem Relation Age of Onset  . Diabetes Mother   . Stroke Mother   . Lupus Mother   . Hypertension Mother   . Congestive Heart Failure Mother   . Mental illness Brother        Not clear what his diagnosis is--may  be related to previous drug use.  . Cancer Maternal Grandmother        colon  . Depression Maternal Uncle   . Alcohol abuse Maternal Uncle   . Diabetes Maternal Grandfather     Social History:  Social History   Socioeconomic History  . Marital status: Single    Spouse name: Not on file  . Number of children: 0  . Years of education: Not on file  . Highest education level: Bachelor's degree (e.g., BA, AB, BS)  Occupational  History  . Occupation: Designer, industrial/product at times  Tobacco Use  . Smoking status: Never Smoker  . Smokeless tobacco: Never Used  Vaping Use  . Vaping Use: Never used  Substance and Sexual Activity  . Alcohol use: No    Alcohol/week: 0.0 standard drinks  . Drug use: No  . Sexual activity: Yes    Birth control/protection: I.U.D.  Other Topics Concern  . Not on file  Social History Narrative   Originally from [place], outside of [place].    Moved here permanently 2012.   Intermittently worked on a farm.   Lives with many cats--3 plus fosters cats.    Single, lives alone in a one story home. Rarely drinks caffeine. 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   Social Determinants of Health   Financial Resource Strain:   . Difficulty of Paying Living Expenses: Not on file  Food Insecurity:   . Worried About Charity fundraiser in the Last Year: Not on file  . Ran Out of Food in the Last Year: Not on file  Transportation Needs:   . Lack of Transportation (Medical): Not on file  . Lack of Transportation (Non-Medical): Not on file  Physical Activity:   . Days of Exercise per Week: Not on file  . Minutes of Exercise per Session: Not on file  Stress:   . Feeling of Stress : Not on file  Social Connections:   . Frequency of Communication with Friends and Family: Not on file  . Frequency of Social Gatherings with Friends and Family: Not on file  . Attends Religious Services: Not on file  . Active Member of Clubs or Organizations: Not on file  . Attends Archivist Meetings: Not on file  . Marital Status: Not on file    Allergies:  Allergies  Allergen Reactions  . Metaxalone Hives and Other (See Comments)    Metabolic Disorder Labs: Lab Results  Component Value Date   HGBA1C 5.2 04/14/2018   MPG 103 04/14/2018   No results found for: PROLACTIN Lab Results  Component Value Date   CHOL 130 04/14/2018   TRIG 192 (H) 04/14/2018   HDL 28 (L)  04/14/2018   CHOLHDL 4.6 04/14/2018   LDLCALC 73 04/14/2018   LDLCALC 99 03/04/2016   Lab Results  Component Value Date   TSH 1.616 01/27/2018   TSH 0.967 09/14/2012    Therapeutic Level Labs: No results found for: LITHIUM No results found for: VALPROATE No components found for:  CBMZ  Current Medications: Current Outpatient Medications  Medication Sig Dispense Refill  . ARIPiprazole (ABILIFY) 15 MG tablet Take 1 tablet (15 mg total) by mouth daily. 30 tablet 2  . benztropine (COGENTIN) 0.5 MG tablet Take 1 tablet (0.5 mg total) by mouth daily. 30 tablet 2  . buPROPion (WELLBUTRIN XL) 300 MG 24 hr tablet Take 1 tablet (300 mg total) by mouth daily. 30 tablet 2  . cetirizine (  ZYRTEC) 10 MG tablet TAKE 1 Tablet BY MOUTH ONCE DAILY 90 tablet 1  . cholecalciferol (VITAMIN D3) 25 MCG (1000 UT) tablet Take 1,000 Units by mouth daily.    . diclofenac (VOLTAREN) 75 MG EC tablet TAKE 1 TABLET BY MOUTH TWICE A DAY 60 tablet 3  . DULoxetine (CYMBALTA) 60 MG capsule Take 60 mg by mouth daily.    . famotidine (PEPCID) 20 MG tablet Take 20 mg by mouth daily.    . fluticasone (FLONASE) 50 MCG/ACT nasal spray Place 2 sprays into both nostrils daily.    [name] gabapentin (NEURONTIN) 100 MG capsule Take 1 capsule (100 mg total) by mouth 3 (three) times daily. 90 capsule 1  . HYDROcodone-acetaminophen (NORCO/VICODIN) 5-325 MG tablet Take 1 tablet by mouth 2 (two) times daily as needed for moderate pain (back pain). 60 tablet 0  . methocarbamol (ROBAXIN) 500 MG tablet Take 1 tablet (500 mg total) by mouth 3 (three) times daily. 90 tablet 0  . montelukast (SINGULAIR) 10 MG tablet TAKE 1 Tablet BY MOUTH EVERY NIGHT AT BEDTIME 90 tablet 1  . pantoprazole (PROTONIX) 40 MG tablet Take 1 tablet (40 mg total) by mouth daily. 30 tablet 3  . polyethylene glycol (MIRALAX / GLYCOLAX) 17 g packet Take 17 g by mouth daily.    . traZODone (DESYREL) 50 MG tablet Take 1 tablet (50 mg total) by mouth at bedtime. 60 tablet 2    No current facility-administered medications for this visit.     Musculoskeletal: Strength & Muscle Tone: Unable to assess due to telehealth visit [place]: Unable to assess due to telehealth visit Patient leans: N/A  Psychiatric Specialty Exam: Review of Systems  There were no vitals taken for this visit.There is no height or weight on file to calculate BMI.  General Appearance: Well Groomed  Eye Contact:  Good  Speech:  Clear and Coherent and Normal Rate  Volume:  Normal  Mood:  Euthymic and Irritable  Affect:  Congruent  Thought Process:  Coherent, Goal Directed and Linear  Orientation:  Full (Time, Place, and Person)  Thought Content: WDL and Logical   Suicidal Thoughts:  No  Homicidal Thoughts:  No  Memory:  Immediate;   Good Recent;   Good Remote;   Good  Judgement:  Good  Insight:  Good  Psychomotor Activity:  Unable to assess due to telehealth visit  Concentration:  Concentration: Good and Attention Span: Good  Recall:  Good  Fund of Knowledge: Good  Language: Good  Akathisia:  No  Handed:  Right  AIMS (if indicated): Not done  Assets:  Communication Skills Desire for Improvement Financial Resources/Insurance Housing Social Support  ADL's:  Intact  Cognition: WNL  Sleep:  Good   Screenings: AIMS     Admission (Discharged) from 01/25/2018 in [facility]  AIMS Total Score 0    AUDIT     Admission (Discharged) from 01/25/2018 in [facility]  Alcohol Use Disorder Identification Test Final Score (AUDIT) 0    GAD-7     Counselor from 10/04/2020 in [facility] Office Visit from 11/28/2019 in [facility] Office Visit from 09/09/2019 in [place] Visit from 05/11/2019 in [place] Visit from 04/08/2018 in [facility]  Total GAD-7 Score 3 4 2 2  7  PHQ2-9     Counselor from 10/04/2020 in [facility] Counselor from 05/24/2020 in [facility] Office Visit from 03/27/2020 in [facility] Office Visit from 11/28/2019 in [facility] Office Visit from 09/09/2019 in [place]  PHQ-2 Total Score 4 2 3 4 3   PHQ-9 Total Score 12 11 13 12 10        Assessment and Plan: Patient reports that she is doing well on current medication regimen. No medication adjustments made today.  She is agreeable to continue medications as prescribed and requests refills.  Provider sent refills to requested pharmacies.  1. Bipolar I disorder (HCC)  Continue- ARIPiprazole (ABILIFY) 15 MG tablet; Take 1 tablet (15 mg total) by mouth daily.  Dispense: 30 tablet; Refill: 2 Continue- benztropine (COGENTIN) 0.5 MG tablet; Take 1 tablet (0.5 mg total) by mouth daily.  Dispense: 30 tablet; Refill: 2  2. Recurrent major depressive disorder, in partial remission (HCC)  Continue- ARIPiprazole (ABILIFY) 15 MG tablet; Take 1 tablet (15 mg total) by mouth daily.  Dispense: 30 tablet; Refill: 2 Continue- traZODone (DESYREL) 50 MG tablet; Take 1 tablet (50 mg total) by mouth at bedtime.  Dispense: 60 tablet; Refill: 2  3. Generalized anxiety disorder  Continue- buPROPion (WELLBUTRIN XL) 300 MG 24 hr tablet; Take 1 tablet (300 mg total) by mouth daily.  Dispense: 30 tablet; Refill: 2    Follow up in 3 moths Follow up with therapy   Salley Slaughter, NP 10/24/2020, 10:09 AM

## 2020-11-01 ENCOUNTER — Ambulatory Visit (INDEPENDENT_AMBULATORY_CARE_PROVIDER_SITE_OTHER): Payer: No Payment, Other | Admitting: Licensed Clinical Social Worker

## 2020-11-01 ENCOUNTER — Other Ambulatory Visit: Payer: Self-pay

## 2020-11-01 DIAGNOSIS — F319 Bipolar disorder, unspecified: Secondary | ICD-10-CM

## 2020-11-01 DIAGNOSIS — F3341 Major depressive disorder, recurrent, in partial remission: Secondary | ICD-10-CM

## 2020-11-01 DIAGNOSIS — F411 Generalized anxiety disorder: Secondary | ICD-10-CM

## 2020-11-01 NOTE — Progress Notes (Signed)
   THERAPIST PROGRESS NOTE  Virtual Visit via Video Note  I connected with Frances [name] on 11/01/20 at 11:00 AM EST by a video enabled telemedicine application and verified that I am speaking with the correct person using two identifiers.  Location: Patient: Home  Provider: [facility]    I discussed the limitations of evaluation and management by telemedicine and the availability of in person appointments. The patient expressed understanding and agreed to proceed.  Session Time: 1   Therapist Response:    Subjective/Objective:  Pt was alert and oriented x 5. She was dressed casually and engaged well when questions were directed towards her. She presented today with flat mood/affect. She was cooperative and maintained good eye contact.   Pt reports primary stressor today as family conflict. She reports that she is not very good with sharing her feelings. LCSW last session brought up writing a letter to her father as he was absent most of her life and seemed to be causing pt stress. Frances [name] asked if she could read that letter today in session. LCSW was agreeable to the reading of the letter. Pt got done reading the letter and LCSW asked "How did that make you feel". Pt stated, "bad because I do not like sharing my feelings". LCSW utilized CBT therapy and asked pt to make a list of coping strategies she would like to learn. Frances [name] was agreeable to that plan.    Assessment/Plan:  Pt endorse symptoms for tension, worry, sadness, worthlessness, hopelessness, and irritability. Currently pt does meet criteria for bipolar depression. Frances [name] is taking all her medication as directed. Plan for therapy moving forward is to start to brainstorm coping skills she would like to learn. She will continue to utilize her animal in the house and coloring as her primary coping strategies and attempt to add new ones throughout next several sessions.      I discussed the assessment and  treatment plan with the patient. The patient was provided an opportunity to ask questions and all were answered. The patient agreed with the plan and demonstrated an understanding of the instructions.   The patient was advised to call back or seek an in-person evaluation if the symptoms worsen or if the condition fails to improve as anticipated.  I provided 45 minutes of non-face-to-face time during this encounter.   Frances Horn, LCSW  Participation Level: Active  Behavioral Response: Casual and Fairly GroomedAlertflat   Type of Therapy: Individual Therapy  Treatment Goals addressed: Diagnosis: bipolar 1 and boarderline personality disorder   Interventions: CBT and Solution Focused  Summary: Frances [name] is a 44 y.o. female who presents with recurrent MDD, GAD,bipolar 1.   Suicidal/Homicidal: NAwithout intent/plan   Plan: Return again in 2 weeks.    Frances Horn, LCSW 11/01/2020

## 2020-11-19 ENCOUNTER — Ambulatory Visit (INDEPENDENT_AMBULATORY_CARE_PROVIDER_SITE_OTHER): Payer: No Typology Code available for payment source | Admitting: Physician Assistant

## 2020-11-19 ENCOUNTER — Encounter: Payer: Self-pay | Admitting: Physician Assistant

## 2020-11-19 VITALS — BP 135/97 | HR 108 | Ht 67.0 in | Wt 206.0 lb

## 2020-11-19 DIAGNOSIS — E6609 Other obesity due to excess calories: Secondary | ICD-10-CM

## 2020-11-19 DIAGNOSIS — R600 Localized edema: Secondary | ICD-10-CM | POA: Insufficient documentation

## 2020-11-19 DIAGNOSIS — J3489 Other specified disorders of nose and nasal sinuses: Secondary | ICD-10-CM

## 2020-11-19 DIAGNOSIS — J310 Chronic rhinitis: Secondary | ICD-10-CM

## 2020-11-19 DIAGNOSIS — Z6832 Body mass index (BMI) 32.0-32.9, adult: Secondary | ICD-10-CM

## 2020-11-19 DIAGNOSIS — R0989 Other specified symptoms and signs involving the circulatory and respiratory systems: Secondary | ICD-10-CM

## 2020-11-19 MED ORDER — METHYLPREDNISOLONE ACETATE 80 MG/ML IJ SUSP
80.0000 mg | Freq: Once | INTRAMUSCULAR | Status: AC
  Administered 2020-11-19: 80 mg via INTRAMUSCULAR

## 2020-11-19 MED ORDER — AZELASTINE HCL 0.1 % NA SOLN: 2.0000 | Freq: Two times a day (BID) | NASAL | 2 refills | Status: DC

## 2020-11-19 MED ORDER — HYDROCHLOROTHIAZIDE 12.5 MG PO TABS: 12.5000 mg | ORAL_TABLET | Freq: Every day | ORAL | 2 refills | Status: DC

## 2020-11-19 MED ORDER — TOPIRAMATE 25 MG PO TABS: ORAL_TABLET | ORAL | 1 refills | Status: DC

## 2020-11-19 NOTE — Progress Notes (Signed)
Subjective:    Patient ID: Frances [name], female    DOB: [birth year], 44 y.o.   MRN: [id number]  HPI  Patient is a 44 year old obese female with chronic fatigue syndrome, chronic pain syndrome, fibromyalgia, rheumatoid arthritis, MTHFR gene mutation, Bipolar 1 disorder who presents to the clinic for follow-up.  For the past month or so patient has had upper chest wall and rib pain that has radiated from left to right to center.  All chest pain today has resolved.  Suspected that this was some costochondritis.  She is feeling much better today.  She is concerned with new bilateral leg swelling and continued chest congestion and sinus drainage.  She feels like she has Chest congestion and sinus draining for the last 3 to 4 months.  She feels like her leg swelling has been over the past 3 weeks.  When she takes Mucinex it does seem to help some.  She has some shortness of breath with exertion but she thought it was from her weight gain.  She denies any wheezing or productive cough.  She denies any fever or chills.  Leg swelling is pretty constant but seems to be worse during the day and at night.  She has not tried anything to make better.  Patient does have chronic allergies and on Allegra.  She recently switched to [place] from [place].  She denies any recent med changes or dose increases.   .. Active Ambulatory Problems    Diagnosis Date Noted  . Hypothyroidism 02/10/2008  . 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 DERMATITIS, ALLERGIC 06/05/2008  . DDD (degenerative disc disease), lumbar 02/10/2008  . Back pain 10/13/2008  . FIBROMYALGIA 02/10/2008  . FATIGUE 05/05/2008  . IRON, SERUM, ELEVATED 02/10/2008  . DYSPNEA 10/15/2011  . Bipolar I disorder ([place]) 09/16/2017  . Fibromyalgia 09/16/2017  . Chronic fatigue syndrome 09/16/2017  . MTHFR gene mutation 09/20/2017  . TMJ (temporomandibular joint syndrome) 09/26/2017  . New daily persistent headache 09/26/2017  . Rheumatoid  arthritis 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 ([place]) 12/09/2017  . Anxiety 01/22/2018  . Borderline personality disorder ([place]) 01/22/2018  . Depression   . Elevated fasting glucose 04/08/2018  . Decreased visual acuity 08/30/2018  . Right corneal abrasion 08/30/2018  . Toenail fungus 09/09/2019  . Vasovagal response 09/09/2019  . Grief reaction 11/28/2019  . Labral tear of hip, degenerative 01/11/2020  . DDD (degenerative disc disease), cervical 03/13/2020  . Ingrowing left great toenail 03/13/2020  . Recurrent major depressive disorder, in partial remission ([place]) 05/24/2020  . Generalized anxiety disorder 05/24/2020  . Macromastia 06/29/2020  . Xiphoid pain 08/13/2020  . Sternum pain 08/13/2020  . Class 1 obesity due to excess calories without serious comorbidity with body mass index (BMI) of 32.0 to 32.9 in adult 08/13/2020  . Symptomatic mammary hypertrophy 09/04/2020  . Costochondritis 10/22/2020  . Rib pain on right side 10/22/2020  . Tachycardia 10/22/2020  . Hepatomegaly 10/22/2020  . Bilateral leg edema 11/19/2020  . Chest congestion 11/20/2020  . Sinus drainage 11/20/2020   Resolved Ambulatory Problems    Diagnosis Date Noted  . FEVER, RECURRENT 04/02/2009  . SINUSITIS- ACUTE-NOS 03/24/2008  . URI 01/17/2009  . ANKLE PAIN, BILATERAL 07/28/2008  . OPEN WOUND FT NO TOE ALONE WITHOUT MENTION COMP 03/28/2009  . Bipolar 1 disorder, depressed, severe ([place]) 01/25/2018  . Chronic right-sided low back pain without sciatica 01/11/2020   Past Medical History:  Diagnosis Date  . Arthritis   . Back pain,  chronic   . Bipolar 1 disorder ([place])   . IUD 2012       Review of Systems See HPI.     Objective:   Physical Exam Vitals reviewed.  Constitutional:      Appearance: Normal appearance. She is obese.  HENT:     Head: Normocephalic.     Right Ear: Tympanic membrane normal.     Left Ear: Tympanic membrane normal.     Nose: Rhinorrhea present.      Mouth/Throat:     Mouth: Mucous membranes are moist.     Pharynx: No oropharyngeal exudate.     Comments: Oropharynx erythema Eyes:     Extraocular Movements: Extraocular movements intact.     Conjunctiva/sclera: Conjunctivae normal.     Pupils: Pupils are equal, round, and reactive to light.  Cardiovascular:     Rate and Rhythm: Regular rhythm. Tachycardia present.     Pulses: Normal pulses.  Pulmonary:     Effort: Pulmonary effort is normal.     Breath sounds: Normal breath sounds.  Musculoskeletal:     Comments: Scant pitting edema mostly seen from sock line indention.  No erythema, warmth, tenderness to palpation.   Lymphadenopathy:     Cervical: No cervical adenopathy.  Neurological:     General: No focal deficit present.     Mental Status: She is alert and oriented to person, place, and time.  Psychiatric:        Mood and Affect: Mood normal.           Assessment & Plan:  Marland KitchenMarland KitchenElenore was seen today for edema.  Diagnoses and all orders for this visit:  Bilateral leg edema -     Comprehensive metabolic panel -     CBC with Differential/Platelet -     TSH -     Brain natriuretic peptide -     hydrochlorothiazide (HYDRODIURIL) 12.5 MG tablet; Take 1 tablet (12.5 mg total) by mouth daily.  Class 1 obesity due to excess calories without serious comorbidity with body mass index (BMI) of 32.0 to 32.9 in adult -     topiramate (TOPAMAX) 25 MG tablet; Take one tablet at bedtime.  Sinus drainage -     azelastine (ASTELIN) 0.1 % nasal spray; Place 2 sprays into both nostrils 2 (two) times daily. Use in each nostril as directed -     methylPREDNISolone acetate (DEPO-MEDROL) injection 80 mg  Chest congestion -     azelastine (ASTELIN) 0.1 % nasal spray; Place 2 sprays into both nostrils 2 (two) times daily. Use in each nostril as directed -     methylPREDNISolone acetate (DEPO-MEDROL) injection 80 mg  Chronic rhinitis -     azelastine (ASTELIN) 0.1 % nasal spray; Place 2  sprays into both nostrils 2 (two) times daily. Use in each nostril as directed -     methylPREDNISolone acetate (DEPO-MEDROL) injection 80 mg   Reassuring that her rib and upper chest wall pain has resolved.  Likely representing more of a fibromyalgia flare.  Bilateral lower leg edema swelling is scant with some pitting edema around sock line.  Mother had a history of congestive heart failure.  She does endorse some shortness of breath.  We will check a BNP.  I do think this represents more chronic venous stasis.  Encourage compression stockings.  I did go ahead and start diuretic HCTZ for 5 days then as needed for lower extremity swelling.  Patient has been gaining weight and I wonder if  this could be contributing to her worsening edema in her legs.  Discussed weight loss.  Encouraged regular walking 20 minutes a day. Encouraged stop eating early in the night around 8 oclock.  Her insurance would not pay for weight loss medications.  We will start Topamax at bedtime.  Discussed potential side effects of Topamax.  Follow-up in 1 month.  If BNP is elevated will get echo of heart.  Patient's lungs sound great today.  She does have some evidence of some sinus drainage. She has allergies and chronic rhinitis symptoms. Depo medrol IM 80mg  given today.  Will start Astelin nasal spray with Allegra.  If not improving will consider other interventions. No red flag signs. Vitals stable.

## 2020-11-19 NOTE — Patient Instructions (Signed)

## 2020-11-20 DIAGNOSIS — J3489 Other specified disorders of nose and nasal sinuses: Secondary | ICD-10-CM | POA: Insufficient documentation

## 2020-11-20 DIAGNOSIS — R0989 Other specified symptoms and signs involving the circulatory and respiratory systems: Secondary | ICD-10-CM | POA: Insufficient documentation

## 2020-11-20 DIAGNOSIS — M797 Fibromyalgia: Principal | ICD-10-CM

## 2020-11-21 MED ORDER — DULOXETINE 30 MG CAPSULE,DELAYED RELEASE
ORAL_CAPSULE | Freq: Every day | ORAL | 2 refills | 30.00000 days | Status: CP
Start: 2020-11-21 — End: 2021-02-19
  Filled 2020-11-22: qty 60, 30d supply, fill #0

## 2020-11-22 ENCOUNTER — Ambulatory Visit (INDEPENDENT_AMBULATORY_CARE_PROVIDER_SITE_OTHER): Payer: No Payment, Other | Admitting: Licensed Clinical Social Worker

## 2020-11-22 ENCOUNTER — Other Ambulatory Visit: Payer: Self-pay

## 2020-11-22 DIAGNOSIS — F411 Generalized anxiety disorder: Secondary | ICD-10-CM

## 2020-11-22 DIAGNOSIS — F319 Bipolar disorder, unspecified: Secondary | ICD-10-CM | POA: Diagnosis not present

## 2020-11-22 LAB — COMPREHENSIVE METABOLIC PANEL
ALT: 51 IU/L — ABNORMAL HIGH (ref 0–32)
AST: 44 IU/L — ABNORMAL HIGH (ref 0–40)
Albumin/Globulin Ratio: 1.8 (ref 1.2–2.2)
Albumin: 4.4 g/dL (ref 3.8–4.8)
Alkaline Phosphatase: 97 IU/L (ref 44–121)
BUN/Creatinine Ratio: 11 (ref 9–23)
BUN: 10 mg/dL (ref 6–24)
Bilirubin Total: 0.6 mg/dL (ref 0.0–1.2)
CO2: 25 mmol/L (ref 20–29)
Calcium: 9.9 mg/dL (ref 8.7–10.2)
Chloride: 103 mmol/L (ref 96–106)
Creatinine, Ser: 0.89 mg/dL (ref 0.57–1.00)
GFR calc Af Amer: 90 mL/min/{1.73_m2} (ref >59)
GFR calc non Af Amer: 78 mL/min/{1.73_m2} (ref >59)
Globulin, Total: 2.4 g/dL (ref 1.5–4.5)
Glucose: 130 mg/dL — ABNORMAL HIGH (ref 65–99)
Potassium: 4.9 mmol/L (ref 3.5–5.2)
Sodium: 142 mmol/L (ref 134–144)
Total Protein: 6.8 g/dL (ref 6.0–8.5)

## 2020-11-22 LAB — CBC WITH DIFFERENTIAL/PLATELET
Basophils Absolute: 0 10*3/uL (ref 0.0–0.2)
Basos: 1 %
EOS (ABSOLUTE): 0.1 10*3/uL (ref 0.0–0.4)
Eos: 2 %
Hematocrit: 40.7 % (ref 34.0–46.6)
Hemoglobin: 14.3 g/dL (ref 11.1–15.9)
Immature Grans (Abs): 0 10*3/uL (ref 0.0–0.1)
Immature Granulocytes: 0 %
Lymphocytes Absolute: 2.1 10*3/uL (ref 0.7–3.1)
Lymphs: 36 %
MCH: 32.7 pg (ref 26.6–33.0)
MCHC: 35.1 g/dL (ref 31.5–35.7)
MCV: 93 fL (ref 79–97)
Monocytes Absolute: 0.4 10*3/uL (ref 0.1–0.9)
Monocytes: 7 %
Neutrophils Absolute: 3.2 10*3/uL (ref 1.4–7.0)
Neutrophils: 54 %
Platelets: 272 10*3/uL (ref 150–450)
RBC: 4.37 x10E6/uL (ref 3.77–5.28)
RDW: 13 % (ref 11.7–15.4)
WBC: 5.8 10*3/uL (ref 3.4–10.8)

## 2020-11-22 LAB — BRAIN NATRIURETIC PEPTIDE: BNP: 9.3 pg/mL (ref 0.0–100.0)

## 2020-11-22 LAB — TSH: TSH: 0.866 u[IU]/mL (ref 0.450–4.500)

## 2020-11-22 MED FILL — DICLOFENAC SODIUM 75 MG TABLET,DELAYED RELEASE: 30 days supply | Qty: 60 | Fill #3 | Status: AC

## 2020-11-22 MED FILL — CYMBALTA 30 MG CAPSULE,DELAYED RELEASE: 30 days supply | Qty: 60 | Fill #0 | Status: AC

## 2020-11-22 MED FILL — DICLOFENAC SODIUM 75 MG TABLET,DELAYED RELEASE: ORAL | 30 days supply | Qty: 60 | Fill #3

## 2020-11-22 NOTE — Progress Notes (Signed)
   THERAPIST PROGRESS NOTE  Virtual Visit via Video Note  I connected with Para March on 11/22/20 at  2:00 PM EST by a video enabled telemedicine application and verified that I am speaking with the correct person using two identifiers.  Location: Patient: Frances [name]  Provider: [name]    I discussed the limitations of evaluation and management by telemedicine and the availability of in person appointments. The patient expressed understanding and agreed to proceed.  Session Time: 20  Therapist Response:     Subjective/Objective:  Pt was alert and oriented x 5. She was dressed casually and engaged in f/u therapy session. [name] presented with flat mood/affect.   Pt primary stressor is friends. Her horse is currently ill and is not able to be ridden for exercise due to the illness. The animal is a big part of [name]'s life and she is concerned for its overall health although it does not seem to be life threatening. Currently the animal is on an anti-inflammatory medicine which will run out soon. [name] is optimistic that this will improve the horse's overall health and riding ability.   LCSW utilized supportive therapy in today's session. Pt spoke about a journal entry that she had wrote 3 years ago about how she can relate to the animals at shelters. LCSW tasked her with writing another entry again about something she feels is important in her life. [name] is agreeable to this.    Assessment/Plan:  Pt endorses symptoms for irritability, worthlessness, and hopelessness. She does meet criteria for bipolar 1 disorder. She is currently taking all her medications as prescribed. Plan moving forward is to start writing journal entries again. She has presented two to LCSW and both were done to the best of pt ability. F/U for pt is 12/16   I discussed the assessment and treatment plan with the patient. The patient was provided an opportunity to ask questions and all were answered.  The patient agreed with the plan and demonstrated an understanding of the instructions.   The patient was advised to call back or seek an in-person evaluation if the symptoms worsen or if the condition fails to improve as anticipated.  I provided 45 minutes of non-face-to-face time during this encounter.   [name], LCSW    Participation Level: Active  Behavioral Response: Casual and Fairly GroomedAlertflat   Type of Therapy: Individual Therapy  Treatment Goals addressed: Diagnosis: bipolar disorder depressive type   Interventions: CBT and Supportive  Summary: ANGELLYNN KIMBERLIN is a 44 y.o. female who presents with bipolar disorder.   Suicidal/Homicidal: NAwithout intent/plan  Plan: Return again in 2 weeks.     [name], LCSW 11/22/2020

## 2020-11-23 ENCOUNTER — Encounter: Payer: Self-pay | Admitting: Physician Assistant

## 2020-11-23 NOTE — Progress Notes (Signed)
Your BNP is normal as to not suggest congestive heart failure as to cause of your ankle/feet swelling.  Thyroid stable.  WBC and Hemoglobin look great.  Glucose elevated but you were not fasting, right?  Your liver enzymes are elevated. Are you taking more tylenol or drinking more alcohol in the last 3 months?

## 2020-11-29 ENCOUNTER — Encounter: Payer: Self-pay | Admitting: Physician Assistant

## 2020-11-29 MED ORDER — FLUTICASONE PROPIONATE 50 MCG/ACT NA SUSP: 2.0000 | Freq: Every day | NASAL | 1 refills | Status: DC

## 2020-12-03 ENCOUNTER — Other Ambulatory Visit: Payer: Self-pay | Admitting: Physician Assistant

## 2020-12-04 ENCOUNTER — Ambulatory Visit: Payer: No Typology Code available for payment source | Admitting: Sports Medicine

## 2020-12-06 ENCOUNTER — Ambulatory Visit (INDEPENDENT_AMBULATORY_CARE_PROVIDER_SITE_OTHER): Payer: No Payment, Other | Admitting: Licensed Clinical Social Worker

## 2020-12-06 ENCOUNTER — Other Ambulatory Visit: Payer: Self-pay

## 2020-12-06 DIAGNOSIS — F319 Bipolar disorder, unspecified: Secondary | ICD-10-CM

## 2020-12-06 DIAGNOSIS — F603 Borderline personality disorder: Secondary | ICD-10-CM

## 2020-12-06 NOTE — Progress Notes (Signed)
° °  THERAPIST PROGRESS NOTE  Virtual Visit via Video Note  I connected with Frances [name] on 12/06/20 at  2:00 PM EST by a video enabled telemedicine application and verified that I am speaking with the correct person using two identifiers.  Location: Patient: [facility]  Provider: [facility]    I discussed the limitations of evaluation and management by telemedicine and the availability of in person appointments. The patient expressed understanding and agreed to proceed.  Session Time: 7  Therapist Response:    Subjective/Objective:  Pt was alert and oriented x 5. She was dressed casually and engaged well throughout assessment. She presented today with flat depressed mood/affect. Frances [name] was cooperative and maintained good eye contact.   LCSW started off session going through PHQ-9 and GAD-7. Pt stayed consistent with GAD-7 scoring a 3 2 times the last three month. PHQ-9 went from a 12 3 months ago to a 11 today. LCSW updated Tx plan as progressing do to quantified data from [facility] and GAD scores.   Pt reports primary stressors as illness related. She has been having chronic back pain for some time now. She has considered a boob reduction, but the procedure was denied here at [facility]. She is attempting to use financial assistance at [facility], but has not heard back for a final determination.   LCSW utilized supportive therapy as primary intervention in today session. Due to the updating of Tx plan pt homework could not be discussed for writings in journal entries. Pt was agreeable to discuss these next times.    Assessment/Plan: Pt endorse symptoms for depression as irritability, worthlessness, hopelessness, and fatigue. Currently pt meets criteria for bipolar depression. Pt also endorse mood liability; chronic feeling of emptiness and unstable self-imagine. She also meets criteria for Borderline personality disorder. Plan for pt is listed above and pt will f/u in 3  weeks.    I discussed the assessment and treatment plan with the patient. The patient was provided an opportunity to ask questions and all were answered. The patient agreed with the plan and demonstrated an understanding of the instructions.   The patient was advised to call back or seek an in-person evaluation if the symptoms worsen or if the condition fails to improve as anticipated.  I provided 45 minutes of non-face-to-face time during this encounter.   [name], LCSW   Participation Level: Active  Behavioral Response: AlertAnxious and Depressed  Type of Therapy: Individual Therapy  Treatment Goals addressed: Diagnosis: bipolar depression   Interventions: CBT and Supportive  Summary: Frances [name] is a 44 y.o. female who presents with bipolar depression.   Suicidal/Homicidal: Nowithout intent/plan   Plan: Return again in 2 weeks.      [name], LCSW 12/06/2020

## 2020-12-12 ENCOUNTER — Ambulatory Visit (INDEPENDENT_AMBULATORY_CARE_PROVIDER_SITE_OTHER): Payer: No Typology Code available for payment source | Admitting: Sports Medicine

## 2020-12-12 ENCOUNTER — Other Ambulatory Visit: Payer: Self-pay | Admitting: *Deleted

## 2020-12-12 ENCOUNTER — Other Ambulatory Visit: Payer: Self-pay

## 2020-12-12 DIAGNOSIS — M5030 Other cervical disc degeneration, unspecified cervical region: Secondary | ICD-10-CM

## 2020-12-12 DIAGNOSIS — M5136 Other intervertebral disc degeneration, lumbar region: Secondary | ICD-10-CM

## 2020-12-12 DIAGNOSIS — M51369 Other intervertebral disc degeneration, lumbar region without mention of lumbar back pain or lower extremity pain: Secondary | ICD-10-CM

## 2020-12-12 MED ORDER — GABAPENTIN 300 MG PO CAPS: ORAL_CAPSULE | ORAL | 3 refills | Status: DC

## 2020-12-12 NOTE — Assessment & Plan Note (Signed)
Frances [name] continues to have neck and low back pain, she has very small distributions in the cervical spine but nothing that appears surgical, he feels as though she was gaining too much weight on Lyrica and was transitioned to gabapentin, 100 mg 3 times daily that is really not doing anything. She was not approved for breast reduction surgery. We will bump up her gabapentin and add physical therapy.

## 2020-12-12 NOTE — Progress Notes (Signed)
    Procedures performed today:    None.  Independent interpretation of notes and tests performed by another provider:   None.  Brief History, Exam, Impression, and Recommendations:    DDD (degenerative disc disease), cervical Frances [name] continues to have neck and low back pain, she has very small distributions in the cervical spine but nothing that appears surgical, he feels as though she was gaining too much weight on Lyrica and was transitioned to gabapentin, 100 mg 3 times daily that is really not doing anything. She was not approved for breast reduction surgery. We will bump up her gabapentin and add physical therapy.  DDD (degenerative disc disease), lumbar Going up on gabapentin. Epidurals were not effective. I think ultimately she is going to need a referral to pain management.    ___________________________________________ Gwen Her. Dianah Field, M.D., ABFM., CAQSM. Primary Care and [place] Instructor of [place] of [facility] of Medicine

## 2020-12-12 NOTE — Assessment & Plan Note (Signed)
Going up on gabapentin. Epidurals were not effective. I think ultimately she is going to need a referral to pain management.

## 2020-12-17 ENCOUNTER — Ambulatory Visit (INDEPENDENT_AMBULATORY_CARE_PROVIDER_SITE_OTHER): Payer: No Typology Code available for payment source | Admitting: Physician Assistant

## 2020-12-17 ENCOUNTER — Encounter: Payer: Self-pay | Admitting: Physician Assistant

## 2020-12-17 ENCOUNTER — Other Ambulatory Visit: Payer: Self-pay

## 2020-12-17 VITALS — BP 134/84 | HR 116 | Ht 67.0 in | Wt 205.0 lb

## 2020-12-17 DIAGNOSIS — R41840 Attention and concentration deficit: Secondary | ICD-10-CM

## 2020-12-17 DIAGNOSIS — Z8659 Personal history of other mental and behavioral disorders: Secondary | ICD-10-CM

## 2020-12-17 DIAGNOSIS — J3489 Other specified disorders of nose and nasal sinuses: Secondary | ICD-10-CM

## 2020-12-17 DIAGNOSIS — J302 Other seasonal allergic rhinitis: Secondary | ICD-10-CM

## 2020-12-17 DIAGNOSIS — R0989 Other specified symptoms and signs involving the circulatory and respiratory systems: Secondary | ICD-10-CM

## 2020-12-17 MED ORDER — LEVOCETIRIZINE DIHYDROCHLORIDE 5 MG PO TABS
5.0000 mg | ORAL_TABLET | Freq: Every evening | ORAL | 0 refills | Status: DC
Start: 2020-12-17 — End: 2021-03-13

## 2020-12-17 NOTE — Progress Notes (Signed)
Subjective:    Patient ID: Frances [name], female    DOB: [birth year], 44 y.o.   MRN: [id number]  HPI  Pt is a 44 yo obese female who presents to the clinic with ongoing chest congestion for years but gets better and worse. She has seasonal allergies and takes singulair, azestaline and allegra.   Pt does wonder about inattention issues and history of ADHD. She was on adderall a long time ago. She feels like her mood is good but still struggling with focus and completing task. She wants to know about restarting treatment. She does see [place] at [facility].   .. Active Ambulatory Problems    Diagnosis Date Noted  . Hypothyroidism 02/10/2008  . 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 DERMATITIS, ALLERGIC 06/05/2008  . DDD (degenerative disc disease), lumbar 02/10/2008  . Back pain 10/13/2008  . FIBROMYALGIA 02/10/2008  . FATIGUE 05/05/2008  . IRON, SERUM, ELEVATED 02/10/2008  . DYSPNEA 10/15/2011  . Bipolar I disorder ([place]) 09/16/2017  . Fibromyalgia 09/16/2017  . Chronic fatigue syndrome 09/16/2017  . MTHFR gene mutation 09/20/2017  . TMJ (temporomandibular joint syndrome) 09/26/2017  . New daily persistent headache 09/26/2017  . Rheumatoid arthritis 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 ([place]) 12/09/2017  . Anxiety 01/22/2018  . Borderline personality disorder ([place]) 01/22/2018  . Depression   . Elevated fasting glucose 04/08/2018  . Decreased visual acuity 08/30/2018  . Right corneal abrasion 08/30/2018  . Toenail fungus 09/09/2019  . Vasovagal response 09/09/2019  . Grief reaction 11/28/2019  . Labral tear of hip, degenerative 01/11/2020  . DDD (degenerative disc disease), cervical 03/13/2020  . Ingrowing left great toenail 03/13/2020  . Recurrent major depressive disorder, in partial remission ([employer]) 05/24/2020  . Generalized anxiety disorder 05/24/2020  . Macromastia 06/29/2020  . Xiphoid pain 08/13/2020  . Sternum pain 08/13/2020  . Class 1 obesity due to excess  calories without serious comorbidity with body mass index (BMI) of 32.0 to 32.9 in adult 08/13/2020  . Symptomatic mammary hypertrophy 09/04/2020  . Costochondritis 10/22/2020  . Rib pain on right side 10/22/2020  . Tachycardia 10/22/2020  . Hepatomegaly 10/22/2020  . Bilateral leg edema 11/19/2020  . Chest congestion 11/20/2020  . Sinus drainage 11/20/2020  . Seasonal allergies 12/25/2020   Resolved Ambulatory Problems    Diagnosis Date Noted  . FEVER, RECURRENT 04/02/2009  . SINUSITIS- ACUTE-NOS 03/24/2008  . URI 01/17/2009  . ANKLE PAIN, BILATERAL 07/28/2008  . OPEN WOUND FT NO TOE ALONE WITHOUT MENTION COMP 03/28/2009  . Bipolar 1 disorder, depressed, severe ([place]) 01/25/2018  . Chronic right-sided low back pain without sciatica 01/11/2020   Past Medical History:  Diagnosis Date  . Arthritis   . Back pain, chronic   . Bipolar 1 disorder ([place])   . IUD 2012      Review of Systems  All other systems reviewed and are negative.      Objective:   Physical Exam Vitals reviewed.  Constitutional:      Appearance: Normal appearance.  HENT:     Head: Normocephalic.     Right Ear: Tympanic membrane normal.     Left Ear: Tympanic membrane normal.     Nose: Nose normal.  Cardiovascular:     Rate and Rhythm: Regular rhythm. Tachycardia present.     Pulses: Normal pulses.  Pulmonary:     Effort: Pulmonary effort is normal.     Breath sounds: Normal breath sounds. No wheezing, rhonchi or rales.  Chest:  Chest wall: No tenderness.  Neurological:     General: No focal deficit present.     Mental Status: She is alert and oriented to person, place, and time.  Psychiatric:        Mood and Affect: Mood normal.           Assessment & Plan:  Marland KitchenMarland KitchenRami was seen today for follow-up.  Diagnoses and all orders for this visit:  Sinus drainage -     levocetirizine (XYZAL) 5 MG tablet; Take 1 tablet (5 mg total) by mouth every evening. -     IgG Allergens(96) Foods -      IgG Allergens (95) Foods  Chest congestion -     levocetirizine (XYZAL) 5 MG tablet; Take 1 tablet (5 mg total) by mouth every evening. -     IgG Allergens(96) Foods -     IgG Allergens (95) Foods  Seasonal allergies -     levocetirizine (XYZAL) 5 MG tablet; Take 1 tablet (5 mg total) by mouth every evening. -     IgG Allergens (95) Foods  Inattention -     Ambulatory referral to Psychology  Poor concentration -     Ambulatory referral to Psychology  History of ADHD -     Ambulatory referral to Psychology   Reassured patient her lungs sounded good. Stop allegra and try xyzal. Continue singulair. Lets do some food allergy testing and see if patient is responding with congestion to something she is eating. Continue using flonase and azestaline.   For inattention. Needs testing or at least [facility] on board with adding treatment to her mental health medications.

## 2020-12-18 ENCOUNTER — Ambulatory Visit (INDEPENDENT_AMBULATORY_CARE_PROVIDER_SITE_OTHER): Payer: No Typology Code available for payment source | Admitting: Rehabilitative and Restorative Service Providers"

## 2020-12-18 ENCOUNTER — Encounter: Payer: Self-pay | Admitting: Rehabilitative and Restorative Service Providers"

## 2020-12-18 DIAGNOSIS — R293 Abnormal posture: Secondary | ICD-10-CM

## 2020-12-18 DIAGNOSIS — M6281 Muscle weakness (generalized): Secondary | ICD-10-CM

## 2020-12-18 DIAGNOSIS — M25551 Pain in right hip: Secondary | ICD-10-CM

## 2020-12-18 DIAGNOSIS — M542 Cervicalgia: Secondary | ICD-10-CM

## 2020-12-18 DIAGNOSIS — M545 Low back pain, unspecified: Secondary | ICD-10-CM

## 2020-12-18 DIAGNOSIS — G8929 Other chronic pain: Secondary | ICD-10-CM

## 2020-12-18 DIAGNOSIS — M1990 Unspecified osteoarthritis, unspecified site: Principal | ICD-10-CM
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
## 2020-12-18 NOTE — Patient Instructions (Signed)
Access Code: [email or web address] URL: [email or web address] Date: 12/18/2020 Prepared by: Margretta Ditty  Exercises Prone on Elbows Reach - 2 x daily - 7 x weekly - 1 sets - 10 reps Cat-Camel - 2 x daily - 7 x weekly - 1 sets - 10 reps Supine Hamstring Stretch with Strap - 2 x daily - 7 x weekly - 1 sets - 3 reps - 30 seocnds hold Seated Figure 4 Piriformis Stretch - 2 x daily - 7 x weekly - 1 sets - 3 reps - 30 seconds hold

## 2020-12-19 LAB — ALLERGENS (95) FOODS IGG
C074-IgG Gelatin: <2 ug/mL (ref 0.0–1.9)
Casein IgG: 2.9 ug/mL — ABNORMAL HIGH (ref 0.0–1.9)
Chicken IgG: <2 ug/mL (ref 0.0–1.9)
Chili Pepper IgG: 3.5 ug/mL — ABNORMAL HIGH (ref 0.0–1.9)
Chocolate/Cacao IgG: <2 ug/mL (ref 0.0–1.9)
Coffee IgG: <2 ug/mL (ref 0.0–1.9)
Corn IgG: 3.7 ug/mL — ABNORMAL HIGH (ref 0.0–1.9)
F001-IgG Egg White: 3 ug/mL — ABNORMAL HIGH (ref 0.0–1.9)
F003-IgG Codfish: <2 ug/mL (ref 0.0–1.9)
F006-IgG Barley, Whole: 7.2 ug/mL — ABNORMAL HIGH (ref 0.0–1.9)
F009-IgG Rice: 4.3 ug/mL — ABNORMAL HIGH (ref 0.0–1.9)
F010-IgG Sesame Seed: 2.6 ug/mL — ABNORMAL HIGH (ref 0.0–1.9)
F012-IgG Green Pea: <2 ug/mL (ref 0.0–1.9)
F015-IgG White Bean: <2 ug/mL (ref 0.0–1.9)
F020-IgG Almond: 3.4 ug/mL — ABNORMAL HIGH (ref 0.0–1.9)
F023-IgG Crab: <2 ug/mL (ref 0.0–1.9)
F027-IgG Beef: 9.6 ug/mL — ABNORMAL HIGH (ref 0.0–1.9)
F031-IgG Carrot: <2 ug/mL (ref 0.0–1.9)
F033-IgG Orange: <2 ug/mL (ref 0.0–1.9)
F036-IgG Coconut: <2 ug/mL (ref 0.0–1.9)
F040-IgG Tuna: <2 ug/mL (ref 0.0–1.9)
F041-IgG Salmon: <2 ug/mL (ref 0.0–1.9)
F044-IgG Strawberry: 2.1 ug/mL — ABNORMAL HIGH (ref 0.0–1.9)
F047-IgG Garlic: <2 ug/mL (ref 0.0–1.9)
F049-IgG Apple: <2 ug/mL (ref 0.0–1.9)
F054-IgG Sweet Potato: <2 ug/mL (ref 0.0–1.9)
F075-IgG Egg (Yolk): <2 ug/mL (ref 0.0–1.9)
F076-IgG Alpha Lactalbumin: <2 ug/mL (ref 0.0–1.9)
F077-IgG Beta Lactoglobulin: 7.6 ug/mL — ABNORMAL HIGH (ref 0.0–1.9)
F079-IgG Gluten: <2 ug/mL (ref 0.0–1.9)
F080-IgG Lobster: <2 ug/mL (ref 0.0–1.9)
F081-IgG Cheese, Cheddar Type: 5.3 ug/mL — ABNORMAL HIGH (ref 0.0–1.9)
F085-IgG Celery: <2 ug/mL (ref 0.0–1.9)
F087-IgG Melon: <2 ug/mL (ref 0.0–1.9)
F089-IgG Mustard: <2 ug/mL (ref 0.0–1.9)
F090-IgG Malt: 9.2 ug/mL — ABNORMAL HIGH (ref 0.0–1.9)
F092-IgG Banana: 11.2 ug/mL — ABNORMAL HIGH (ref 0.0–1.9)
F094-IgG Pear: <2 ug/mL (ref 0.0–1.9)
F095-IgG Peach: <2 ug/mL (ref 0.0–1.9)
F096-IgG Avocado: <2 ug/mL (ref 0.0–1.9)
F182-IgG Lima Bean: <2 ug/mL (ref 0.0–1.9)
F202-IgG Cashew Nut: <2 ug/mL (ref 0.0–1.9)
F207-IgG Clam: <2 ug/mL (ref 0.0–1.9)
F208-IgG Lemon: <2 ug/mL (ref 0.0–1.9)
F209-IgG Grapefruit: <2 ug/mL (ref 0.0–1.9)
F210-IgG Pineapple: 8.5 ug/mL — ABNORMAL HIGH (ref 0.0–1.9)
F212-IgG Mushroom: 2.5 ug/mL — ABNORMAL HIGH (ref 0.0–1.9)
F214-IgG Spinach: <2 ug/mL (ref 0.0–1.9)
F215-IgG Lettuce: <2 ug/mL (ref 0.0–1.9)
F216-IgG Cabbage: <2 ug/mL (ref 0.0–1.9)
F218-IgG Paprika/Sweet Pepper: <2 ug/mL (ref 0.0–1.9)
F220-IgG Cinnamon: 2.1 ug/mL — ABNORMAL HIGH (ref 0.0–1.9)
F222-IgG Tea: <2 ug/mL (ref 0.0–1.9)
F225-IgG Pumpkin: <2 ug/mL (ref 0.0–1.9)
F234-IgG Vanilla: <2 ug/mL (ref 0.0–1.9)
F236-IgG Whey: 14.3 ug/mL — ABNORMAL HIGH (ref 0.0–1.9)
F242-IgG Bing Cherry: <2 ug/mL (ref 0.0–1.9)
F244-IgG Cucumber: <2 ug/mL (ref 0.0–1.9)
F256-IgG Walnut: <2 ug/mL (ref 0.0–1.9)
F259-IgG Grape: <2 ug/mL (ref 0.0–1.9)
F260-IgG Broccoli: <2 ug/mL (ref 0.0–1.9)
F261-IgG Asparagus: <2 ug/mL (ref 0.0–1.9)
F262-IgG Eggplant: <2 ug/mL (ref 0.0–1.9)
F265-IgG Cumin: <2 ug/mL (ref 0.0–1.9)
F269-IgG Basil: <2 ug/mL (ref 0.0–1.9)
F270-IgG Ginger: <2 ug/mL (ref 0.0–1.9)
F278-IgG Bayleaf (Laurel): <2 ug/mL (ref 0.0–1.9)
F280-IgG Black Pepper: 5.6 ug/mL — ABNORMAL HIGH (ref 0.0–1.9)
F283-IgG Oregano: 2.1 ug/mL — ABNORMAL HIGH (ref 0.0–1.9)
F284-IgG Turkey: <2 ug/mL (ref 0.0–1.9)
F287-IgG Kidney Bean: <2 ug/mL (ref 0.0–1.9)
F288-IgG Blueberry: <2 ug/mL (ref 0.0–1.9)
F291-IgG Cauliflower: <2 ug/mL (ref 0.0–1.9)
F300-IgG Goat's Milk: 6.9 ug/mL — ABNORMAL HIGH (ref 0.0–1.9)
F306-IgG Lime: <2 ug/mL (ref 0.0–1.9)
F319-IgG Red Beet: 2.6 ug/mL — ABNORMAL HIGH (ref 0.0–1.9)
F324-IgG Hop (Food): 3.3 ug/mL — ABNORMAL HIGH (ref 0.0–1.9)
F329-IgG Watermelon: <2 ug/mL (ref 0.0–1.9)
F338-IgG Scallop: <2 ug/mL (ref 0.0–1.9)
F341-IgG Cranberry: <2 ug/mL (ref 0.0–1.9)
F342-IgG Olive, Black: <2 ug/mL (ref 0.0–1.9)
F343-IgG Raspberry: <2 ug/mL (ref 0.0–1.9)
Green Bean IgG: <2 ug/mL (ref 0.0–1.9)
Lamb IgG: <2 ug/mL (ref 0.0–1.9)
Oat IgG: 10.2 ug/mL — ABNORMAL HIGH (ref 0.0–1.9)
Onion IgG: 2.3 ug/mL — ABNORMAL HIGH (ref 0.0–1.9)
Peanut IgG: <2 ug/mL (ref 0.0–1.9)
Pork IgG: 6.1 ug/mL — ABNORMAL HIGH (ref 0.0–1.9)
Potato, White, IgG: <2 ug/mL (ref 0.0–1.9)
Rye IgG: 8.3 ug/mL — ABNORMAL HIGH (ref 0.0–1.9)
Shrimp IgG: <2 ug/mL (ref 0.0–1.9)
Soybean IgG: <2 ug/mL (ref 0.0–1.9)
Tomato IgG: <2 ug/mL (ref 0.0–1.9)
Wheat IgG: 8.2 ug/mL — ABNORMAL HIGH (ref 0.0–1.9)
Yeast IgG: <2 ug/mL (ref 0.0–1.9)

## 2020-12-19 MED ORDER — FAMOTIDINE 40 MG TABLET
ORAL_TABLET | Freq: Every evening | ORAL | 5 refills | 60.00000 days | Status: CP
Start: 2020-12-19 — End: 2021-12-19
  Filled 2020-12-20: qty 60, 60d supply, fill #0

## 2020-12-19 NOTE — Progress Notes (Signed)
Your highest foods with IGG responses are: whey, banana, oats, malt, beef, barley, wheat. I would start with elimination of these foods first and see how you feel.

## 2020-12-20 MED FILL — DICLOFENAC SODIUM 75 MG TABLET,DELAYED RELEASE: ORAL | 30 days supply | Qty: 60 | Fill #4

## 2020-12-20 MED FILL — CYMBALTA 30 MG CAPSULE,DELAYED RELEASE: 30 days supply | Qty: 60 | Fill #1 | Status: AC

## 2020-12-20 MED FILL — CYMBALTA 30 MG CAPSULE,DELAYED RELEASE: ORAL | 30 days supply | Qty: 60 | Fill #1

## 2020-12-20 MED FILL — DICLOFENAC SODIUM 75 MG TABLET,DELAYED RELEASE: 30 days supply | Qty: 60 | Fill #4 | Status: AC

## 2020-12-20 MED FILL — FAMOTIDINE 40 MG TABLET: 60 days supply | Qty: 60 | Fill #0 | Status: AC

## 2020-12-24 ENCOUNTER — Encounter: Payer: Self-pay | Admitting: Physician Assistant

## 2020-12-24 DIAGNOSIS — R0781 Pleurodynia: Secondary | ICD-10-CM

## 2020-12-24 DIAGNOSIS — M940 Chondrocostal junction syndrome [Tietze]: Secondary | ICD-10-CM

## 2020-12-25 ENCOUNTER — Other Ambulatory Visit: Payer: Self-pay

## 2020-12-25 ENCOUNTER — Encounter: Payer: Self-pay | Admitting: Physical Therapy

## 2020-12-25 ENCOUNTER — Encounter: Payer: Self-pay | Admitting: Physician Assistant

## 2020-12-25 ENCOUNTER — Ambulatory Visit (INDEPENDENT_AMBULATORY_CARE_PROVIDER_SITE_OTHER): Payer: No Typology Code available for payment source | Admitting: Physical Therapy

## 2020-12-25 DIAGNOSIS — M25551 Pain in right hip: Secondary | ICD-10-CM

## 2020-12-25 DIAGNOSIS — M545 Low back pain, unspecified: Secondary | ICD-10-CM

## 2020-12-25 DIAGNOSIS — M6281 Muscle weakness (generalized): Secondary | ICD-10-CM

## 2020-12-25 DIAGNOSIS — J302 Other seasonal allergic rhinitis: Secondary | ICD-10-CM | POA: Insufficient documentation

## 2020-12-25 DIAGNOSIS — G8929 Other chronic pain: Secondary | ICD-10-CM

## 2020-12-25 DIAGNOSIS — R293 Abnormal posture: Secondary | ICD-10-CM

## 2020-12-25 DIAGNOSIS — M542 Cervicalgia: Secondary | ICD-10-CM

## 2020-12-25 DIAGNOSIS — R29898 Other symptoms and signs involving the musculoskeletal system: Secondary | ICD-10-CM

## 2020-12-25 NOTE — Therapy (Signed)
[facility] [address] [place], [place], [address] Phone: [phone]   Fax:  [phone]  Physical Therapy Treatment  Patient Details  Name: Frances [name] MRN: [id number] Date of Birth: 09/25/1976 Referring Provider (PT): Rodney Langtonhomas Thekkekandam, MD   Encounter Date: 12/25/2020   PT End of Session - 12/25/20 0845    Visit Number 2    Number of Visits 12    Date for PT Re-Evaluation 01/29/21    Authorization Type [employer] discount    PT Start Time 0849   heat at end   PT Stop Time 0934    PT Time Calculation (min) 45 min    Activity Tolerance Patient limited by pain    Behavior [facility] for tasks assessed/performed           Past Medical History:  Diagnosis Date  . Anxiety   . Arthritis   . Back pain, chronic   . Bipolar 1 disorder (HCC)   . Chronic fatigue syndrome   . DDD (degenerative disc disease), lumbar   . Depression   . Fibromyalgia   . IUD 2012   Mirena  . MTHFR gene mutation     Past Surgical History:  Procedure Laterality Date  . COLONOSCOPY WITH PROPOFOL N/A 02/25/2017   Procedure: COLONOSCOPY WITH PROPOFOL;  Surgeon: Willis ModenaWilliam Outlaw, MD;  Location: [facility];  Service: Endoscopy;  Laterality: N/A;  . left knee lateral release  1993  . scar tisue removal  03/2005   left ankle  . TONSILLECTOMY  age 45's    There were no vitals filed for this visit.   Subjective Assessment - 12/25/20 0852    Patient Stated Goals Decrease pain, stand or walk longer    Currently in Pain? Yes    Pain Score 5     Pain Location Back   and thoracic   Pain Descriptors / Indicators Sore;Aching    Pain Type Chronic pain    Pain Onset More than a month ago    Pain Frequency Constant    Aggravating Factors  exercise    Pain Relieving Factors lying on her back                             [facility] Treatment/Exercise - 12/25/20 0001      Lumbar Exercises: Stretches   Passive  Hamstring Stretch Left;Right;2 reps;30 seconds   supine with strap   Lower Trunk Rotation --   10 reps   Other Lumbar Stretch Exercise hooklying knees in/out for stretching    Other Lumbar Stretch Exercise thoracic mobilzation lying over yoga mat      Lumbar Exercises: Supine   Other Supine Lumbar Exercises 10 reps decompression exercise upper and lower body.      Manual Therapy   Manual Therapy Joint mobilization;Soft tissue mobilization    Joint Mobilization grade III CPA and Lt UPA mobs T4-10, hypomobile with pain T 6-9 Lt UPA    Soft tissue mobilization stm to lt paraspinals in the thoracic area, banding palpable.                       PT Long Term Goals - 12/18/20 2106      PT LONG TERM GOAL #1   Title The patient will be indep with HEP.    Time 6    Period Weeks    Target Date 01/29/21  PT LONG TERM GOAL #2   Title The patient will reduce functional limitation per FOTO from 51% to 41%.    Time 6    Period Weeks    Target Date 01/29/21      PT LONG TERM GOAL #3   Title The patient will tolerate walking x 15 minutes non-stop 2-3 times/week for return to walking program.    Time 6    Period Weeks    Target Date 01/29/21      PT LONG TERM GOAL #4   Title The patient will demonstrate improved ROM in lumbar spine by 25%.    Time 6    Period Weeks    Target Date 01/29/21                 Plan - 12/25/20 0856    Clinical Impression Statement This is Frances [name]'s first visit since her eval.  She feels like the HEP is causing her more pain.  This was modified and intensity decreased,  She was able to perform the [facility] decompression series without.  She has some hypomobility in the thoracic spine and stiffness in bilat hip rotators.  Would benefit from continued tx to adddress her deficits    Rehab Potential Good    PT Frequency 2x / week    PT Duration 6 weeks    PT Treatment/Interventions ADLs/Self Care Home Management;Patient/family  education;Therapeutic exercise;Therapeutic activities;Electrical Stimulation;Moist Heat;Traction;Aquatic Therapy;Gait training;Taping;Dry needling;Manual techniques    PT Next Visit Plan assess response to new HEP, spinal mobs and hip rotation flexibility    PT Home Exercise Plan Access Code: [email or web address]    Consulted and Agree with Plan of Care Patient           Patient will benefit from skilled therapeutic intervention in order to improve the following deficits and impairments:  Pain,Hypomobility,Impaired flexibility,Increased fascial restricitons,Decreased range of motion,Decreased strength,Postural dysfunction  Visit Diagnosis: Chronic bilateral low back pain without sciatica  Muscle weakness (generalized)  Pain in right hip  Cervicalgia  Abnormal posture  Other symptoms and signs involving the musculoskeletal system     Problem List Patient Active Problem List   Diagnosis Date Noted  . Seasonal allergies 12/25/2020  . Chest congestion 11/20/2020  . Sinus drainage 11/20/2020  . Bilateral leg edema 11/19/2020  . Costochondritis 10/22/2020  . Rib pain on right side 10/22/2020  . Tachycardia 10/22/2020  . Hepatomegaly 10/22/2020  . Symptomatic mammary hypertrophy 09/04/2020  . Xiphoid pain 08/13/2020  . Sternum pain 08/13/2020  . Class 1 obesity due to excess calories without serious comorbidity with body mass index (BMI) of 32.0 to 32.9 in adult 08/13/2020  . Macromastia 06/29/2020  . Recurrent major depressive disorder, in partial remission (HCC) 05/24/2020  . Generalized anxiety disorder 05/24/2020  . DDD (degenerative disc disease), cervical 03/13/2020  . Ingrowing left great toenail 03/13/2020  . Labral tear of hip, degenerative 01/11/2020  . Grief reaction 11/28/2019  . Toenail fungus 09/09/2019  . Vasovagal response 09/09/2019  . Decreased visual acuity 08/30/2018  . Right corneal abrasion 08/30/2018  . Elevated fasting glucose 04/08/2018  . Depression   .  Anxiety 01/22/2018  . Borderline personality disorder (HCC) 01/22/2018  . 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 TMJ (temporomandibular joint syndrome) 09/26/2017  . New daily persistent headache 09/26/2017  . MTHFR gene mutation 09/20/2017  . Bipolar I disorder (HCC) 09/16/2017  . Fibromyalgia 09/16/2017  . Chronic fatigue syndrome 09/16/2017  . DYSPNEA 10/15/2011  .  Back pain 10/13/2008  . DERMATITIS, ALLERGIC 06/05/2008  . FATIGUE 05/05/2008  . Hypothyroidism 02/10/2008  . 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 DDD (degenerative disc disease), lumbar 02/10/2008  . FIBROMYALGIA 02/10/2008  . IRON, SERUM, ELEVATED 02/10/2008    Manuela Schwartz Ivannah Zody PT  12/25/2020, 9:30 AM  [facility] [place], [place], [address] Phone: [phone]   Fax:  [phone]  Name: NKECHINYERE FERDERER MRN: [id number] Date of Birth: 1976/08/15

## 2020-12-25 NOTE — Patient Instructions (Signed)
Decompression exercise routine MEEKs method.

## 2020-12-26 ENCOUNTER — Other Ambulatory Visit: Payer: Self-pay | Admitting: Physician Assistant

## 2020-12-26 MED ORDER — METHOCARBAMOL 500 MG PO TABS: 500.0000 mg | ORAL_TABLET | Freq: Three times a day (TID) | ORAL | 1 refills | Status: DC

## 2020-12-26 NOTE — Addendum Note (Signed)
Addended by: Jomarie Longs on: 12/26/2020 04:54 PM   Modules accepted: Orders

## 2020-12-28 ENCOUNTER — Encounter: Payer: Self-pay | Admitting: Physical Therapy

## 2020-12-28 ENCOUNTER — Other Ambulatory Visit: Payer: Self-pay

## 2020-12-28 ENCOUNTER — Ambulatory Visit (INDEPENDENT_AMBULATORY_CARE_PROVIDER_SITE_OTHER): Payer: No Typology Code available for payment source | Admitting: Physical Therapy

## 2020-12-28 DIAGNOSIS — G8929 Other chronic pain: Secondary | ICD-10-CM

## 2020-12-28 DIAGNOSIS — M25551 Pain in right hip: Secondary | ICD-10-CM

## 2020-12-28 DIAGNOSIS — M6281 Muscle weakness (generalized): Secondary | ICD-10-CM

## 2020-12-28 DIAGNOSIS — M545 Low back pain, unspecified: Secondary | ICD-10-CM

## 2020-12-28 NOTE — Patient Instructions (Signed)
Access Code: [email or web address]: [email or web address]: 01/07/2022Prepared by: [facility] Walking: Begin with 5-10 minutes 2 times/day for gentle movement. Exercises  Cat-Camel - 2 x daily - 7 x weekly - 1 sets - 10 reps  Seated Figure 4 Piriformis Stretch - 2 x daily - 7 x weekly - 1 sets - 3 reps - 30 seconds hold  Bird Dog - 1 x daily - 7 x weekly - 1-2 sets - 5 reps  Hooklying Hamstring Stretch with Strap - 2 x daily - 7 x weekly - 1 sets - 2-3 reps - 30 seconds hold

## 2020-12-28 NOTE — Therapy (Signed)
[place], [place], [address] Phone: [phone]   Fax:  [phone]  Physical Therapy Treatment  Patient Details  Name: Frances [name] MRN: [id number] Date of Birth: 1976/04/26 Referring Provider (PT): Aundria Mems, MD   Encounter Date: 12/28/2020   PT End of Session - 12/28/20 1000    Visit Number 3    Number of Visits 12    Date for PT Re-Evaluation 01/29/21    Authorization Type [employer] discount    PT Start Time 0930    PT Stop Time 1013    PT Time Calculation (min) 43 min    Activity Tolerance Patient tolerated treatment well    Behavior During Therapy [facility] for tasks assessed/performed           Past Medical History:  Diagnosis Date  . Anxiety   . Arthritis   . Back pain, chronic   . Bipolar 1 disorder ([place])   . Chronic fatigue syndrome   . DDD (degenerative disc disease), lumbar   . Depression   . Fibromyalgia   . IUD 2012   Mirena  . MTHFR gene mutation     Past Surgical History:  Procedure Laterality Date  . COLONOSCOPY WITH PROPOFOL N/A 02/25/2017   Procedure: COLONOSCOPY WITH PROPOFOL;  Surgeon: Arta Silence, MD;  Location: [facility];  Service: Endoscopy;  Laterality: N/A;  . left knee lateral release  1993  . scar tisue removal  03/2005   left ankle  . TONSILLECTOMY  age 49's    There were no vitals filed for this visit.   Subjective Assessment - 12/28/20 0934    Subjective "I'm sore".  She reports she is sore, but not as sore as after first session.  She has had more soreness in the morning. She wishes she had a treadmill, because she doesn't necessarily like to walk outside.    Patient Stated Goals Decrease pain, stand or walk longer    Currently in Pain? Yes    Pain Score 3     Pain Location Back    Pain Orientation Mid;Lower    Pain Descriptors / Indicators Sore    Aggravating Factors  being up too long    Pain Relieving Factors lying on her back               [facility] PT Assessment - 12/28/20 0001      Assessment   Medical Diagnosis Cervical and Lumbar DDD    Referring Provider (PT) Aundria Mems, MD    Onset Date/Surgical Date 12/12/20    Hand Dominance Right            [facility] Adult PT Treatment/Exercise - 12/28/20 0001      Lumbar Exercises: Stretches   Passive Hamstring Stretch Left;Right;2 reps;30 seconds   supine with strap   Piriformis Stretch Right;Left;2 reps;30 seconds   seated   Other Lumbar Stretch Exercise open book x 5 reps each side.      Lumbar Exercises: Aerobic   Tread Mill 5 min at 1.6 mph for warm up.      Lumbar Exercises: Supine   Other Supine Lumbar Exercises 5 reps of decompression exercises:  Leg press x 5 sec each leg, arm/leg lengthener x 5 reps each side,  thoracic lift x 5 reps, head press.      Lumbar Exercises: Quadruped   Madcat/Old Horse 5 reps    Opposite Arm/Leg Raise Right arm/Left leg;Left arm/Right leg;5 reps  Other Quadruped Lumbar Exercises childs pose x 2 min during IASTM.      Manual Therapy   Manual Therapy Soft tissue mobilization;Taping    Manual therapy comments pt in childs pose during IASTM/taping.    Soft tissue mobilization IASTM to bilat lumbar paraspinals to decrease fascial restrictions and improve mobility (L tighter than R).    [place] I strip of reg Rock tape placed with 20% stretch to bilat lumbar paraspinals to decompress tissue and increase proprioception.                  PT Education - 12/28/20 1046    Education Details HEP    Person(s) Educated Patient    Methods Explanation;Handout;Demonstration;Verbal cues    Comprehension Returned demonstration;Verbalized understanding               PT Long Term Goals - 12/18/20 2106      PT LONG TERM GOAL #1   Title The patient will be indep with HEP.    Time 6    Period Weeks    Target Date 01/29/21      PT LONG TERM GOAL #2   Title  The patient will reduce functional limitation per FOTO from 51% to 41%.    Time 6    Period Weeks    Target Date 01/29/21      PT LONG TERM GOAL #3   Title The patient will tolerate walking x 15 minutes non-stop 2-3 times/week for return to walking program.    Time 6    Period Weeks    Target Date 01/29/21      PT LONG TERM GOAL #4   Title The patient will demonstrate improved ROM in lumbar spine by 25%.    Time 6    Period Weeks    Target Date 01/29/21                 Plan - 12/28/20 1019    Clinical Impression Statement Pt reported reduction of pain in back to 1/10 after combination of stretch and decompression exercises.  Encouraged pt to start her day with HEP and perform again in late afternoon to decrease soreness.  Also encouraged pt to begin small walking program to assist with return to prior level of function. Pt to look for resistance bands (from previous episodes of care) and her physioball over weekend.  Pt making gradual progress towards goals.    Rehab Potential Good    PT Frequency 2x / week    PT Duration 6 weeks    PT Treatment/Interventions ADLs/Self Care Home Management;Patient/family education;Therapeutic exercise;Therapeutic activities;Electrical Stimulation;Moist Heat;Traction;Aquatic Therapy;Gait training;Taping;Dry needling;Manual techniques    PT Next Visit Plan progress HEP as tolerated; spinal mobs and hip rotation flexibility    PT Home Exercise Plan Access Code: [email or web address]    Consulted and Agree with Plan of Care Patient           Patient will benefit from skilled therapeutic intervention in order to improve the following deficits and impairments:  Pain,Hypomobility,Impaired flexibility,Increased fascial restricitons,Decreased range of motion,Decreased strength,Postural dysfunction  Visit Diagnosis: Chronic bilateral low back pain without sciatica  Muscle weakness (generalized)  Pain in right hip     Problem List Patient Active Problem  List   Diagnosis Date Noted  . Seasonal allergies 12/25/2020  . Chest congestion 11/20/2020  . Sinus drainage 11/20/2020  . Bilateral leg edema 11/19/2020  . Costochondritis 10/22/2020  .  Rib pain on right side 10/22/2020  . Tachycardia 10/22/2020  . Hepatomegaly 10/22/2020  . Symptomatic mammary hypertrophy 09/04/2020  . Xiphoid pain 08/13/2020  . Sternum pain 08/13/2020  . Class 1 obesity due to excess calories without serious comorbidity with body mass index (BMI) of 32.0 to 32.9 in adult 08/13/2020  . Macromastia 06/29/2020  . Recurrent major depressive disorder, in partial remission ([place]) 05/24/2020  . Generalized anxiety disorder 05/24/2020  . DDD (degenerative disc disease), cervical 03/13/2020  . Ingrowing left great toenail 03/13/2020  . Labral tear of hip, degenerative 01/11/2020  . Grief reaction 11/28/2019  . Toenail fungus 09/09/2019  . Vasovagal response 09/09/2019  . Decreased visual acuity 08/30/2018  . Right corneal abrasion 08/30/2018  . Elevated fasting glucose 04/08/2018  . Depression   . Anxiety 01/22/2018  . Borderline personality disorder ([place]) 01/22/2018  . Rheumatoid arthritis 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 ([place]) 12/09/2017  . TMJ (temporomandibular joint syndrome) 09/26/2017  . New daily persistent headache 09/26/2017  . MTHFR gene mutation 09/20/2017  . Bipolar I disorder ([place]) 09/16/2017  . Fibromyalgia 09/16/2017  . Chronic fatigue syndrome 09/16/2017  . DYSPNEA 10/15/2011  . Back pain 10/13/2008  . DERMATITIS, ALLERGIC 06/05/2008  . FATIGUE 05/05/2008  . Hypothyroidism 02/10/2008  . 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 DDD (degenerative disc disease), lumbar 02/10/2008  . FIBROMYALGIA 02/10/2008  . IRON, SERUM, ELEVATED 02/10/2008   Kerin Perna, PTA 12/28/20 10:46 AM  [facility]  [place], [place], [address] Phone: [phone]   Fax:   [phone]  Name: Frances [name] MRN: [id number] Date of Birth: 02-Dec-1976

## 2020-12-31 ENCOUNTER — Ambulatory Visit (INDEPENDENT_AMBULATORY_CARE_PROVIDER_SITE_OTHER): Payer: No Typology Code available for payment source | Admitting: Physical Therapy

## 2020-12-31 ENCOUNTER — Other Ambulatory Visit: Payer: Self-pay

## 2020-12-31 DIAGNOSIS — G8929 Other chronic pain: Secondary | ICD-10-CM

## 2020-12-31 DIAGNOSIS — M545 Low back pain, unspecified: Secondary | ICD-10-CM

## 2020-12-31 DIAGNOSIS — M6281 Muscle weakness (generalized): Secondary | ICD-10-CM

## 2020-12-31 DIAGNOSIS — M542 Cervicalgia: Secondary | ICD-10-CM

## 2020-12-31 DIAGNOSIS — M25551 Pain in right hip: Secondary | ICD-10-CM

## 2020-12-31 DIAGNOSIS — R293 Abnormal posture: Secondary | ICD-10-CM

## 2020-12-31 NOTE — Therapy (Signed)
[facility] [place] [address] [place], [place], [address] Phone: [phone]   Fax:  [phone]  Physical Therapy Treatment  Patient Details  Name: MRY URIAS MRN: [id number] Date of Birth: 02-07-1976 Referring Provider (PT): Rodney Langton, MD   Encounter Date: 12/31/2020   PT End of Session - 12/31/20 0940    Visit Number 4    Number of Visits 12    Date for PT Re-Evaluation 01/29/21    Authorization Type [employer] discount    PT Start Time 0935    PT Stop Time 1010    PT Time Calculation (min) 35 min    Activity Tolerance Patient tolerated treatment well    Behavior During Therapy [facility] for tasks assessed/performed           Past Medical History:  Diagnosis Date  . Anxiety   . Arthritis   . Back pain, chronic   . Bipolar 1 disorder (HCC)   . Chronic fatigue syndrome   . DDD (degenerative disc disease), lumbar   . Depression   . Fibromyalgia   . IUD 2012   Mirena  . MTHFR gene mutation     Past Surgical History:  Procedure Laterality Date  . COLONOSCOPY WITH PROPOFOL N/A 02/25/2017   Procedure: COLONOSCOPY WITH PROPOFOL;  Surgeon: Willis Modena, MD;  [facility];  Service: Endoscopy;  Laterality: N/A;  . left knee lateral release  1993  . scar tisue removal  03/2005   left ankle  . TONSILLECTOMY  age 60's    There were no vitals filed for this visit.   Subjective Assessment - 12/31/20 0936    Subjective Pt reports she didn't do much this weekend.  She states she did some stretches when she started to hurt, and this helped.  Continued stiffness in lower back in mornings.    Currently in Pain? Yes    Pain Score 3     Pain Location Back    Pain Orientation Lower;Right    Pain Descriptors / Indicators Sore    Pain Radiating Towards nerve pain down LLE to thigh; present daily    Aggravating Factors  being up too long    Pain Relieving Factors lying on back, stretching.               [facility] PT Assessment - 12/31/20 0001      Assessment   Medical Diagnosis Cervical and Lumbar DDD    Referring Provider (PT) Rodney Langton, MD    Onset Date/Surgical Date 12/12/20    Hand Dominance Right    Next MD Visit 01/24/20      AROM   Lumbar - Right Side Bend 50% limitation, pain on Lt side.    Lumbar - Left Side Bend 50% limitation    Lumbar - Right Rotation 25% limitation no pain    Lumbar - Left Rotation 50% limitation with pain on Lt side.           [facility] Adult PT Treatment/Exercise - 12/31/20 0001      Lumbar Exercises: Stretches   Passive Hamstring Stretch Left;Right;2 reps;30 seconds   supine with strap   Other Lumbar Stretch Exercise --    Other Lumbar Stretch Exercise Midlevel doorways stretch x 20 sec x 2 reps; supine butterfly stretch x 30 sec x 2 reps      Lumbar Exercises: Aerobic   Tread Mill 5 min at 1.8-2.0 mph for warm up.      Lumbar Exercises: Seated  Sit to Stand 10 reps   no use of hands, eccentric lowering   Other Seated Lumbar Exercises seated cat/cow x 5 reps      Lumbar Exercises: Supine   Bridge 10 reps      Lumbar Exercises: Sidelying   Other Sidelying Lumbar Exercises Open book with red band x 5 reps each side      Lumbar Exercises: Quadruped   Madcat/Old Horse --    Opposite Arm/Leg Raise Right arm/Left leg;Left arm/Right leg;5 reps    Other Quadruped Lumbar Exercises childs pose with lateral trunk flexion x 15 sec each side, the reg childs pose      Manual Therapy   Manual Therapy Soft tissue mobilization;Taping    Manual therapy comments pt in childs pose during IASTM/taping.    Soft tissue mobilization IASTM to bilat lumbar paraspinals to decrease fascial restrictions and improve mobility (L tighter than R).    [place] I strip of reg Rock tape placed with 20% stretch to bilat lumbar paraspinals to decompress tissue and increase proprioception.                        PT Long Term Goals - 12/18/20 2106      PT LONG TERM GOAL #1   Title The patient will be indep with HEP.    Time 6    Period Weeks    Target Date 01/29/21      PT LONG TERM GOAL #2   Title The patient will reduce functional limitation per FOTO from 51% to 41%.    Time 6    Period Weeks    Target Date 01/29/21      PT LONG TERM GOAL #3   Title The patient will tolerate walking x 15 minutes non-stop 2-3 times/week for return to walking program.    Time 6    Period Weeks    Target Date 01/29/21      PT LONG TERM GOAL #4   Title The patient will demonstrate improved ROM in lumbar spine by 25%.    Time 6    Period Weeks    Target Date 01/29/21                 Plan - 12/31/20 1011    Clinical Impression Statement Pt reported elimination of low back pain after completion of exercises.  Continued palpable tightness in bilat lumbar paraspinals; reduced slightly with IASTM.  She demonstrated improved lumbar ROM today.  Pt making good progress towards goals. She may be a appropriate candidate for aquatic therapy for setting up HEP to manage her chronic LBP.    Rehab Potential Good    PT Frequency 2x / week    PT Duration 6 weeks    PT Treatment/Interventions ADLs/Self Care Home Management;Patient/family education;Therapeutic exercise;Therapeutic activities;Electrical Stimulation;Moist Heat;Traction;Aquatic Therapy;Gait training;Taping;Dry needling;Manual techniques    PT Next Visit Plan progress HEP as tolerated; spinal mobs and hip rotation flexibility    PT Home Exercise Plan Access Code: [email or web address]    Consulted and Agree with Plan of Care Patient           Patient will benefit from skilled therapeutic intervention in order to improve the following deficits and impairments:  Pain,Hypomobility,Impaired flexibility,Increased fascial restricitons,Decreased range of motion,Decreased strength,Postural dysfunction  Visit Diagnosis: Chronic bilateral low  back pain without sciatica  Muscle weakness (generalized)  Pain in right hip  Cervicalgia  Abnormal posture     Problem List Patient Active Problem List   Diagnosis Date Noted  . Seasonal allergies 12/25/2020  . Chest congestion 11/20/2020  . Sinus drainage 11/20/2020  . Bilateral leg edema 11/19/2020  . Costochondritis 10/22/2020  . Rib pain on right side 10/22/2020  . Tachycardia 10/22/2020  . Hepatomegaly 10/22/2020  . Symptomatic mammary hypertrophy 09/04/2020  . Xiphoid pain 08/13/2020  . Sternum pain 08/13/2020  . Class 1 obesity due to excess calories without serious comorbidity with body mass index (BMI) of 32.0 to 32.9 in adult 08/13/2020  . Macromastia 06/29/2020  . Recurrent major depressive disorder, in partial remission ([place]) 05/24/2020  . Generalized anxiety disorder 05/24/2020  . DDD (degenerative disc disease), cervical 03/13/2020  . Ingrowing left great toenail 03/13/2020  . Labral tear of hip, degenerative 01/11/2020  . Grief reaction 11/28/2019  . Toenail fungus 09/09/2019  . Vasovagal response 09/09/2019  . Decreased visual acuity 08/30/2018  . Right corneal abrasion 08/30/2018  . Elevated fasting glucose 04/08/2018  . Depression   . Anxiety 01/22/2018  . Borderline personality disorder ([place]) 01/22/2018  . Rheumatoid arthritis 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 ([place]) 12/09/2017  . TMJ (temporomandibular joint syndrome) 09/26/2017  . New daily persistent headache 09/26/2017  . MTHFR gene mutation 09/20/2017  . Bipolar I disorder ([place]) 09/16/2017  . Fibromyalgia 09/16/2017  . Chronic fatigue syndrome 09/16/2017  . DYSPNEA 10/15/2011  . Back pain 10/13/2008  . DERMATITIS, ALLERGIC 06/05/2008  . FATIGUE 05/05/2008  . Hypothyroidism 02/10/2008  . 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 DDD (degenerative disc disease), lumbar 02/10/2008  . FIBROMYALGIA 02/10/2008  . IRON, SERUM, ELEVATED 02/10/2008   Kerin Perna,  PTA 12/31/20 12:21 PM  [place], [place], [address] Phone: [phone]   Fax:  [phone]  Name: YARISBEL MIRANDA MRN: [id number] Date of Birth: November 14, 1976

## 2021-01-01 ENCOUNTER — Ambulatory Visit (HOSPITAL_COMMUNITY): Payer: No Payment, Other | Admitting: Licensed Clinical Social Worker

## 2021-01-01 DIAGNOSIS — F603 Borderline personality disorder: Secondary | ICD-10-CM

## 2021-01-01 DIAGNOSIS — F319 Bipolar disorder, unspecified: Secondary | ICD-10-CM

## 2021-01-01 NOTE — Progress Notes (Signed)
   THERAPIST PROGRESS NOTE  Virtual Visit via Video Note  I connected with Para March on 01/01/21 at  9:00 AM EST by a video enabled telemedicine application and verified that I am speaking with the correct person using two identifiers.  Location: Patient: [facility]  Provider: [facility]    I discussed the limitations of evaluation and management by telemedicine and the availability of in person appointments. The patient expressed understanding and agreed to proceed.   Session Time: 80  Therapist Response:   Subjective/Objective:  Pt was alert and oriented x 5. She was dressed casually and engaged minimally in session. Pt presented with flat mood/affect. [name] was cooperative and maintained good eye contact in session.   Primary stressor today is her horse being sick. She has been unable to ride him in about 2 months now. Normally medication works for her horse but this time it has not. The Vet suggested a different medication that is very expensive for pt and she cannot afford. They are exploring other alternative for the expensive medication currently. LCSW did speak to LCSW about her lack of engagement in session pt stated she is not a morning person.   Plan:  LCSW spoke with pt about her assignment for today which was do 15 minutes of journaling 2 to 3 times per week. Pt stated 15 minutes was too much time. Plan moving forward will be to journal 2 to 3 times for 5 minutes each time. Pt was agreeable to attempt it and report back to LCSW in next session    Assessment: Pt endorse symptoms of sadness, worthlessness, tension, worry, irritability, and isolation. She currently meets criteria for bipolar 1 depression. She is taking her medications as prescribed. Pt will f/u with LCSW in 3 weeks   I discussed the assessment and treatment plan with the patient. The patient was provided an opportunity to ask questions and all were answered. The patient agreed with the plan  and demonstrated an understanding of the instructions.   The patient was advised to call back or seek an in-person evaluation if the symptoms worsen or if the condition fails to improve as anticipated.  I provided 45 minutes of non-face-to-face time during this encounter.   [name], LCSW  Participation Level: Active  Behavioral Response: Casual and Fairly GroomedAlertDepressed, Hopeless, Irritable and Worthless  Type of Therapy: Individual Therapy  Treatment Goals addressed: Diagnosis: bipolar 1 disorder & borderline personality disorder   Interventions: CBT and Supportive  Summary: MARRANDA ARAKELIAN is a 45 y.o. female who presents with borderline personality disorder and & bipolar disorder.   Suicidal/Homicidal: Nowithout intent/plan    Plan: Return again in 4  weeks.      [name], LCSW 01/01/2021

## 2021-01-03 ENCOUNTER — Encounter: Payer: Self-pay | Admitting: Physical Therapy

## 2021-01-03 ENCOUNTER — Other Ambulatory Visit: Payer: Self-pay | Admitting: Physician Assistant

## 2021-01-03 ENCOUNTER — Ambulatory Visit (INDEPENDENT_AMBULATORY_CARE_PROVIDER_SITE_OTHER): Payer: No Typology Code available for payment source | Admitting: Physical Therapy

## 2021-01-03 ENCOUNTER — Other Ambulatory Visit: Payer: Self-pay

## 2021-01-03 DIAGNOSIS — M545 Low back pain, unspecified: Secondary | ICD-10-CM

## 2021-01-03 DIAGNOSIS — R293 Abnormal posture: Secondary | ICD-10-CM

## 2021-01-03 DIAGNOSIS — M542 Cervicalgia: Secondary | ICD-10-CM

## 2021-01-03 DIAGNOSIS — R41840 Attention and concentration deficit: Secondary | ICD-10-CM

## 2021-01-03 DIAGNOSIS — M25551 Pain in right hip: Secondary | ICD-10-CM

## 2021-01-03 DIAGNOSIS — Z8659 Personal history of other mental and behavioral disorders: Secondary | ICD-10-CM

## 2021-01-03 DIAGNOSIS — G8929 Other chronic pain: Secondary | ICD-10-CM

## 2021-01-03 DIAGNOSIS — M6281 Muscle weakness (generalized): Secondary | ICD-10-CM

## 2021-01-03 NOTE — Patient Instructions (Addendum)
  Leg Lengthener: Full    Straighten one leg. Pull toes AND forefoot toward knee, extend heel. Lengthen leg by pulling pelvis away from ribs. Hold _5__ seconds. Relax. Repeat 1 time. Re-bend knee. Do other leg. Each leg _5__ times.  Arm Lengthener: Single    Arms at sides, palms down. Lift one arm over head to alongside ear, keeping elbow straight. Lengthen arm by pulling ribs away from pelvis. Hold _5-10__ seconds. Relax. Repeat 5 times. Return arm to side. Repeat with other arm.  Leg Press: Single    Straighten one leg down to floor. Bring toes AND forefoot toward knee, extend heel. Press leg down. DO NOT BEND KNEE. Hold _5-10__ seconds. Relax leg. Repeat exercise 1 time. Relax leg. Re-bend knee. Repeat with other leg. Each leg _5__ times.    Access Code: [email or web address] URL: [email or web address] Date: 01/03/2021 Prepared by: Carlin  Program Notes Walking:  Begin with 5-10 minutes 2 times/day for gentle movement.   Exercises Seated Figure 4 Piriformis Stretch - 2 x daily - 7 x weekly - 1 sets - 3 reps - 20 seconds hold Cat-Camel - 2 x daily - 7 x weekly - 1 sets - 5-10 reps Tail Wag - 1 x daily - 7 x weekly - 5 reps - 5 sec hold Bird Dog - 1 x daily - 3 x weekly - 1-2 sets - 5 reps Hooklying Hamstring Stretch with Strap - 2 x daily - 7 x weekly - 1 sets - 2-3 reps - 30 seconds hold Sidelying Thoracic Rotation with Open Book - 1 x daily - 7 x weekly - 5 reps - 10 seconds hold

## 2021-01-03 NOTE — Progress Notes (Signed)
New referral placed.

## 2021-01-03 NOTE — Therapy (Signed)
[facility] [place], [place], [address] Phone: [phone]   Fax:  [phone]  Physical Therapy Treatment  Patient Details  Name: Frances [name] MRN: [id number] Date of Birth: [birth year] Referring Provider (PT): Aundria Mems, MD   Encounter Date: 01/03/2021   PT End of Session - 01/03/21 0930    Visit Number 5    Number of Visits 12    Date for PT Re-Evaluation 01/29/21    Authorization Type [employer] discount    PT Start Time 0931    PT Stop Time 1019   MHP last 10 min   PT Time Calculation (min) 48 min           Past Medical History:  Diagnosis Date  . Anxiety   . Arthritis   . Back pain, chronic   . Bipolar 1 disorder ([place])   . Chronic fatigue syndrome   . DDD (degenerative disc disease), lumbar   . Depression   . Fibromyalgia   . IUD 2012   Mirena  . MTHFR gene mutation     Past Surgical History:  Procedure Laterality Date  . COLONOSCOPY WITH PROPOFOL N/A 02/25/2017   Procedure: COLONOSCOPY WITH PROPOFOL;  Surgeon: Arta Silence, MD;  Location: [facility];  Service: Endoscopy;  Laterality: N/A;  . left knee lateral release  1993  . scar tisue removal  03/2005   left ankle  . TONSILLECTOMY  age 39's    There were no vitals filed for this visit.   Subjective Assessment - 01/03/21 0935    Subjective "I'm sore everywhere since last visit".   She reports she doesn't have much pain if sitting still, but has more soreness with movement.  Didn't notice much difference with tape this time.    Patient Stated Goals Decrease pain, stand or walk longer    Currently in Pain? Yes    Pain Score 3     Pain Location Generalized    Aggravating Factors  moving    Pain Relieving Factors being still.              [facility] PT Assessment - 01/03/21 0001      Assessment   Medical Diagnosis Cervical and Lumbar DDD    Referring Provider (PT) Aundria Mems, MD    Onset Date/Surgical Date  12/12/20    Hand Dominance Right    Next MD Visit 01/24/20             [facility] Adult PT Treatment/Exercise - 01/03/21 0001      Lumbar Exercises: Stretches   Passive Hamstring Stretch Left;Right;2 reps;30 seconds   hooklying with strap   Lower Trunk Rotation 3 reps;10 seconds   arms in T   Hip Flexor Stretch Left;2 reps;20 seconds   seated with arm overhead   Other Lumbar Stretch Exercise hooklying adductor stretch with strap x 15 sec x 2, ITB stretch with strap (opp knee bent) x 15 sec x 2 each      Lumbar Exercises: Aerobic   Tread Mill 5 min at 1.6-2.0 mph for warm up.      Lumbar Exercises: Seated   Sit to Stand 5 reps   no use of hands, eccentric lowering     Lumbar Exercises: Supine   Dead Bug 5 reps   each side   Bridge 10 reps;3 seconds    Other Supine Lumbar Exercises hooklying:  5 snow angels with arms to tolerance    Other Supine Lumbar Exercises  Leg/arm lengthener x 5 sec x 5 reps each side.      Lumbar Exercises: Sidelying   Other Sidelying Lumbar Exercises Open book without band x 5 reps each side, 10 sec hold.  Modified to painfree range.      Lumbar Exercises: Prone   Opposite Arm/Leg Raise 5 reps;Right arm/Left leg;Left arm/Right leg;2 seconds   without axial ext     Lumbar Exercises: Quadruped   Madcat/Old Horse 5 reps    Madcat/Old Horse Limitations and wag the tail x 5 reps      Modalities   Modalities Moist Heat      Moist Heat Therapy   Number Minutes Moist Heat 10 Minutes    Moist Heat Location Lumbar Spine      Manual Therapy   Soft tissue mobilization TPR and deep tissue to Lt lumbar paraspinals to decrease fascial restrictions.                       PT Long Term Goals - 12/18/20 2106      PT LONG TERM GOAL #1   Title The patient will be indep with HEP.    Time 6    Period Weeks    Target Date 01/29/21      PT LONG TERM GOAL #2   Title The patient will reduce functional limitation per FOTO from 51% to 41%.    Time 6     Period Weeks    Target Date 01/29/21      PT LONG TERM GOAL #3   Title The patient will tolerate walking x 15 minutes non-stop 2-3 times/week for return to walking program.    Time 6    Period Weeks    Target Date 01/29/21      PT LONG TERM GOAL #4   Title The patient will demonstrate improved ROM in lumbar spine by 25%.    Time 6    Period Weeks    Target Date 01/29/21                 Plan - 01/03/21 0947    Clinical Impression Statement Pt arrived with reported elevated pain level throughout whole body with movement.  Encouraged pt to perform decompression exercises and gentle stretches on days when pain level is elevated and strengthening exercises on "good days" when pain level is less to maintain strength gains. Pt reported some improvement in overall pain with exercises, but upper lumbar pain remained unchanged. Goals are ongoing.    Rehab Potential Good    PT Frequency 2x / week    PT Duration 6 weeks    PT Treatment/Interventions ADLs/Self Care Home Management;Patient/family education;Therapeutic exercise;Therapeutic activities;Electrical Stimulation;Moist Heat;Traction;Aquatic Therapy;Gait training;Taping;Dry needling;Manual techniques    PT Next Visit Plan progress HEP as tolerated; spinal mobs and hip rotation flexibility    PT Home Exercise Plan Access Code: [email or web address]    Consulted and Agree with Plan of Care Patient           Patient will benefit from skilled therapeutic intervention in order to improve the following deficits and impairments:  Pain,Hypomobility,Impaired flexibility,Increased fascial restricitons,Decreased range of motion,Decreased strength,Postural dysfunction  Visit Diagnosis: Chronic bilateral low back pain without sciatica  Muscle weakness (generalized)  Pain in right hip  Cervicalgia  Abnormal posture     Problem List Patient Active Problem List   Diagnosis Date Noted  . Seasonal allergies 12/25/2020  . Chest congestion  11/20/2020  . Sinus drainage 11/20/2020  .  Bilateral leg edema 11/19/2020  . Costochondritis 10/22/2020  . Rib pain on right side 10/22/2020  . Tachycardia 10/22/2020  . Hepatomegaly 10/22/2020  . Symptomatic mammary hypertrophy 09/04/2020  . Xiphoid pain 08/13/2020  . Sternum pain 08/13/2020  . Class 1 obesity due to excess calories without serious comorbidity with body mass index (BMI) of 32.0 to 32.9 in adult 08/13/2020  . Macromastia 06/29/2020  . Recurrent major depressive disorder, in partial remission ([place]) 05/24/2020  . Generalized anxiety disorder 05/24/2020  . DDD (degenerative disc disease), cervical 03/13/2020  . Ingrowing left great toenail 03/13/2020  . Labral tear of hip, degenerative 01/11/2020  . Grief reaction 11/28/2019  . Toenail fungus 09/09/2019  . Vasovagal response 09/09/2019  . Decreased visual acuity 08/30/2018  . Right corneal abrasion 08/30/2018  . Elevated fasting glucose 04/08/2018  . Depression   . Anxiety 01/22/2018  . Borderline personality disorder ([place]) 01/22/2018  . 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 TMJ (temporomandibular joint syndrome) 09/26/2017  . New daily persistent headache 09/26/2017  . MTHFR gene mutation 09/20/2017  . Bipolar I disorder ([place]) 09/16/2017  . Fibromyalgia 09/16/2017  . Chronic fatigue syndrome 09/16/2017  . DYSPNEA 10/15/2011  . Back pain 10/13/2008  . DERMATITIS, ALLERGIC 06/05/2008  . FATIGUE 05/05/2008  . Hypothyroidism 02/10/2008  . 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 DDD (degenerative disc disease), lumbar 02/10/2008  . FIBROMYALGIA 02/10/2008  . IRON, SERUM, ELEVATED 02/10/2008   Kerin Perna, PTA 01/03/21 10:31 AM  [facility]: [place] [address] [place], [place], [address] Phone: [phone]   Fax:  [phone]  Name: Frances [name] MRN: [id number] Date of Birth:  08-11-1976

## 2021-01-08 ENCOUNTER — Ambulatory Visit (INDEPENDENT_AMBULATORY_CARE_PROVIDER_SITE_OTHER): Payer: No Typology Code available for payment source | Admitting: Physical Therapy

## 2021-01-08 ENCOUNTER — Encounter: Payer: Self-pay | Admitting: Physical Therapy

## 2021-01-08 ENCOUNTER — Other Ambulatory Visit: Payer: Self-pay

## 2021-01-08 DIAGNOSIS — R293 Abnormal posture: Secondary | ICD-10-CM

## 2021-01-08 DIAGNOSIS — M545 Low back pain, unspecified: Secondary | ICD-10-CM

## 2021-01-08 DIAGNOSIS — G8929 Other chronic pain: Secondary | ICD-10-CM

## 2021-01-08 DIAGNOSIS — M542 Cervicalgia: Secondary | ICD-10-CM

## 2021-01-08 DIAGNOSIS — M25551 Pain in right hip: Secondary | ICD-10-CM

## 2021-01-08 DIAGNOSIS — M6281 Muscle weakness (generalized): Secondary | ICD-10-CM

## 2021-01-08 NOTE — Therapy (Signed)
[place], [place], [address] Phone: [phone]   Fax:  [phone]  Physical Therapy Treatment  Patient Details  Name: Frances [name] MRN: [id number] Date of Birth: 01/28/1976 Referring Provider (PT): Aundria Mems, MD   Encounter Date: 01/08/2021   PT End of Session - 01/08/21 1025    Visit Number 6    Number of Visits 12    Date for PT Re-Evaluation 01/29/21    Authorization Type [employer] discount    PT Start Time 1017    PT Stop Time 1116   MHP last 10 min   PT Time Calculation (min) 59 min    Behavior During Therapy [facility] for tasks assessed/performed           Past Medical History:  Diagnosis Date  . Anxiety   . Arthritis   . Back pain, chronic   . Bipolar 1 disorder ([place])   . Chronic fatigue syndrome   . DDD (degenerative disc disease), lumbar   . Depression   . Fibromyalgia   . IUD 2012   Mirena  . MTHFR gene mutation     Past Surgical History:  Procedure Laterality Date  . COLONOSCOPY WITH PROPOFOL N/A 02/25/2017   Procedure: COLONOSCOPY WITH PROPOFOL;  Surgeon: Arta Silence, MD;  Location: [facility];  Service: Endoscopy;  Laterality: N/A;  . left knee lateral release  1993  . scar tisue removal  03/2005   left ankle  . TONSILLECTOMY  age 71's    There were no vitals filed for this visit.   Subjective Assessment - 01/08/21 1027    Subjective Pt reports she has some soreness from walking in the snow/ice.  Her legs are sore from squatting to wrap her horses legs.  She walked 5 min afterwards to work out some of the soreness.    Patient Stated Goals Decrease pain, stand or walk longer    Currently in Pain? Yes    Pain Score 3     Pain Location Back    Pain Orientation Lower   at beltline   Pain Descriptors / Indicators Sore              [facility] PT Assessment - 01/08/21 0001      Assessment   Medical Diagnosis Cervical and Lumbar DDD    Referring Provider  (PT) Aundria Mems, MD    Onset Date/Surgical Date 12/12/20    Hand Dominance Right    Next MD Visit 01/24/20           [facility] Adult PT Treatment/Exercise - 01/08/21 0001      Lumbar Exercises: Stretches   Lower Trunk Rotation Limitations hooklying wide feet, for hip stretch x 3 reps each direction    Piriformis Stretch Right;Left;2 reps;20 seconds   modified pigeon and seated   Piriformis Stretch Limitations decreased tolerance for this stretch today.    Other Lumbar Stretch Exercise quad stretch in prone with strap x 30 sec x 2 reps each leg.      Lumbar Exercises: Aerobic   Tread Mill 7 min at 2.0 mph      Lumbar Exercises: Standing   Other Standing Lumbar Exercises Opp arm/leg x 8 reps each side. some discomfort in Rt low back with hip ext      Lumbar Exercises: Supine   Bridge 10 reps;3 seconds      Lumbar Exercises: Sidelying   Other Sidelying Lumbar Exercises open book with yellow band x 8  reps each side, pain-free      Lumbar Exercises: Quadruped   Madcat/Old Horse 5 reps    Madcat/Old Horse Limitations and wag the tail x 5 reps      Modalities   Modalities Traction      Moist Heat Therapy   Number Minutes Moist Heat 10 Minutes    Moist Heat Location Lumbar Spine (after traction)     Traction   Type of Traction Lumbar    Min (lbs) 48    Max (lbs) 33    Hold Time 60    Rest Time 20    Time 15              PT Long Term Goals - 12/18/20 2106      PT LONG TERM GOAL #1   Title The patient will be indep with HEP.    Time 6    Period Weeks    Target Date 01/29/21      PT LONG TERM GOAL #2   Title The patient will reduce functional limitation per FOTO from 51% to 41%.    Time 6    Period Weeks    Target Date 01/29/21      PT LONG TERM GOAL #3   Title The patient will tolerate walking x 15 minutes non-stop 2-3 times/week for return to walking program.    Time 6    Period Weeks    Target Date 01/29/21      PT LONG TERM GOAL #4   Title The  patient will demonstrate improved ROM in lumbar spine by 25%.    Time 6    Period Weeks    Target Date 01/29/21                 Plan - 01/08/21 1057    Clinical Impression Statement Pt arrived reporting some irritation at Rt L5/S1 region; increased pain in this area with standing hip ext and during trial of traction.  Pt reported decreased LLE symptoms during traction, however persistant Rt SI joint irritation after traction.  Pt reported increased discomfort along Rt iliac crest/post pelvis at 55# of pull; improved tolerance when reduced to 48# at beginning of traction trial. Applied heat at end of session to assist with pain reduction/ muscle relaxation; SI pain had resolved and her radicular symptoms in LLE had improved. Inquired if pt would be interested in aquatic therapy; pt voiced mild interest but doesn't foresee herself joining pool.Plan to continue progressing towards LTGs.    Rehab Potential Good    PT Frequency 2x / week    PT Duration 6 weeks    PT Treatment/Interventions ADLs/Self Care Home Management;Patient/family education;Therapeutic exercise;Therapeutic activities;Electrical Stimulation;Moist Heat;Traction;Aquatic Therapy;Gait training;Taping;Dry needling;Manual techniques    PT Next Visit Plan assess response to traction; progress HEP as tolerated.  spinal stabilization exercises.    PT Home Exercise Plan Access Code: [email or web address]    Consulted and Agree with Plan of Care Patient           Patient will benefit from skilled therapeutic intervention in order to improve the following deficits and impairments:  Pain,Hypomobility,Impaired flexibility,Increased fascial restricitons,Decreased range of motion,Decreased strength,Postural dysfunction  Visit Diagnosis: Chronic bilateral low back pain without sciatica  Muscle weakness (generalized)  Pain in right hip  Cervicalgia  Abnormal posture     Problem List Patient Active Problem List   Diagnosis Date Noted  .  Seasonal allergies 12/25/2020  . Chest congestion 11/20/2020  . Sinus drainage 11/20/2020  .  Bilateral leg edema 11/19/2020  . Costochondritis 10/22/2020  . Rib pain on right side 10/22/2020  . Tachycardia 10/22/2020  . Hepatomegaly 10/22/2020  . Symptomatic mammary hypertrophy 09/04/2020  . Xiphoid pain 08/13/2020  . Sternum pain 08/13/2020  . Class 1 obesity due to excess calories without serious comorbidity with body mass index (BMI) of 32.0 to 32.9 in adult 08/13/2020  . Macromastia 06/29/2020  . Recurrent major depressive disorder, in partial remission ([place]) 05/24/2020  . Generalized anxiety disorder 05/24/2020  . DDD (degenerative disc disease), cervical 03/13/2020  . Ingrowing left great toenail 03/13/2020  . Labral tear of hip, degenerative 01/11/2020  . Grief reaction 11/28/2019  . Toenail fungus 09/09/2019  . Vasovagal response 09/09/2019  . Decreased visual acuity 08/30/2018  . Right corneal abrasion 08/30/2018  . Elevated fasting glucose 04/08/2018  . Depression   . Anxiety 01/22/2018  . Borderline personality disorder ([place]) 01/22/2018  . Rheumatoid arthritis 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 ([place]) 12/09/2017  . TMJ (temporomandibular joint syndrome) 09/26/2017  . New daily persistent headache 09/26/2017  . MTHFR gene mutation 09/20/2017  . Bipolar I disorder ([place]) 09/16/2017  . Fibromyalgia 09/16/2017  . Chronic fatigue syndrome 09/16/2017  . DYSPNEA 10/15/2011  . Back pain 10/13/2008  . DERMATITIS, ALLERGIC 06/05/2008  . FATIGUE 05/05/2008  . Hypothyroidism 02/10/2008  . 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 DDD (degenerative disc disease), lumbar 02/10/2008  . FIBROMYALGIA 02/10/2008  . IRON, SERUM, ELEVATED 02/10/2008   Kerin Perna, PTA 01/08/21 11:34 AM  [place], [place], [address] Phone: [phone]   Fax:  [phone]  Name: Frances [name] MRN: [id number] Date of Birth: 25-Nov-1976

## 2021-01-10 ENCOUNTER — Ambulatory Visit (INDEPENDENT_AMBULATORY_CARE_PROVIDER_SITE_OTHER): Payer: No Typology Code available for payment source | Admitting: Physical Therapy

## 2021-01-10 ENCOUNTER — Encounter: Payer: Self-pay | Admitting: Physical Therapy

## 2021-01-10 ENCOUNTER — Other Ambulatory Visit: Payer: Self-pay

## 2021-01-10 DIAGNOSIS — M545 Low back pain, unspecified: Secondary | ICD-10-CM

## 2021-01-10 DIAGNOSIS — M25551 Pain in right hip: Secondary | ICD-10-CM

## 2021-01-10 DIAGNOSIS — M542 Cervicalgia: Secondary | ICD-10-CM

## 2021-01-10 DIAGNOSIS — M6281 Muscle weakness (generalized): Secondary | ICD-10-CM

## 2021-01-10 DIAGNOSIS — G8929 Other chronic pain: Secondary | ICD-10-CM

## 2021-01-10 DIAGNOSIS — R293 Abnormal posture: Secondary | ICD-10-CM

## 2021-01-10 NOTE — Therapy (Signed)
[place], [place], [address] Phone: [phone]   Fax:  [phone]  Physical Therapy Treatment  Patient Details  Name: Frances [name] MRN: [id number] Date of Birth: 1976-03-21 Referring Provider (PT): Aundria Mems, MD   Encounter Date: 01/10/2021   PT End of Session - 01/10/21 1015    Visit Number 7    Number of Visits 12    Date for PT Re-Evaluation 01/29/21    Authorization Type [employer] discount    PT Start Time 1015    PT Stop Time 1101    PT Time Calculation (min) 46 min    Activity Tolerance Patient tolerated treatment well    Behavior During Therapy [facility] for tasks assessed/performed           Past Medical History:  Diagnosis Date  . Anxiety   . Arthritis   . Back pain, chronic   . Bipolar 1 disorder ([place])   . Chronic fatigue syndrome   . DDD (degenerative disc disease), lumbar   . Depression   . Fibromyalgia   . IUD 2012   Mirena  . MTHFR gene mutation     Past Surgical History:  Procedure Laterality Date  . COLONOSCOPY WITH PROPOFOL N/A 02/25/2017   Procedure: COLONOSCOPY WITH PROPOFOL;  Surgeon: Arta Silence, MD;  Location: [facility];  Service: Endoscopy;  Laterality: N/A;  . left knee lateral release  1993  . scar tisue removal  03/2005   left ankle  . TONSILLECTOMY  age 67's    There were no vitals filed for this visit.   Subjective Assessment - 01/10/21 1016    Subjective Pt reports she is stiff this morning.  She walked around [place] after last session and "felt ok".  The symptoms in Lt leg aren't going down as far today.  She did some walking inside her home yesterday (because of weather) for 4 min and 8 min.    Patient Stated Goals Decrease pain, stand or walk longer    Currently in Pain? No/denies    Pain Score 0-No pain              [facility] PT Assessment - 01/10/21 0001      Assessment   Medical Diagnosis Cervical and Lumbar DDD     Referring Provider (PT) Aundria Mems, MD    Onset Date/Surgical Date 12/12/20    Hand Dominance Right    Next MD Visit 01/24/20            [facility] Adult PT Treatment/Exercise - 01/10/21 0001      Lumbar Exercises: Aerobic   Tread Mill 8 min at 2.0 mph      Lumbar Exercises: Standing   Other Standing Lumbar Exercises side stepping with increased step height x 15 ft Rt/Lt x 2 reps     Lumbar Exercises: Supine   Bridge 10 reps;3 seconds      Lumbar Exercises: Sidelying   Clam Right;Left;10 reps   with red band, core engaged.   Other Sidelying Lumbar Exercises open book with red band x 5 reps each side, 5 sec hold, pain-free    Other Sidelying Lumbar Exercises modified side plank x 5-10 sec x 3 reps each side.      Lumbar Exercises: Quadruped   Madcat/Old Horse 5 reps    Madcat/Old Horse Limitations and wag the tail x 5 reps    Opposite Arm/Leg Raise Right arm/Left leg;Left arm/Right leg;5 reps    Other Quadruped  Lumbar Exercises childs pose with lateral trunk flexion x 15 sec each side, then reg childs pose 15 sec      Moist Heat Therapy   Number Minutes Moist Heat 10 Minutes   during the traction   Moist Heat Location Lumbar Spine      Traction   Type of Traction Lumbar    Min (lbs) 50    Max (lbs) 35    Hold Time 60    Rest Time 20    Time 15                       PT Long Term Goals - 12/18/20 2106      PT LONG TERM GOAL #1   Title The patient will be indep with HEP.    Time 6    Period Weeks    Target Date 01/29/21      PT LONG TERM GOAL #2   Title The patient will reduce functional limitation per FOTO from 51% to 41%.    Time 6    Period Weeks    Target Date 01/29/21      PT LONG TERM GOAL #3   Title The patient will tolerate walking x 15 minutes non-stop 2-3 times/week for return to walking program.    Time 6    Period Weeks    Target Date 01/29/21      PT LONG TERM GOAL #4   Title The patient will demonstrate improved ROM in lumbar  spine by 25%.    Time 6    Period Weeks    Target Date 01/29/21                 Plan - 01/10/21 1051    Clinical Impression Statement Improved exercise tolerance today, with increased time on treadmill and additional strengthening exercises without increase in symptoms.  Positive response to traction with more centralization of symptoms in Lt leg. Pt able to tolerate 50# of pull for lumbar traction without any pain in SI today; will increase poundage next visit.   Pt making good gains towards goals.    Rehab Potential Good    PT Frequency 2x / week    PT Duration 6 weeks    PT Treatment/Interventions ADLs/Self Care Home Management;Patient/family education;Therapeutic exercise;Therapeutic activities;Electrical Stimulation;Moist Heat;Traction;Aquatic Therapy;Gait training;Taping;Dry needling;Manual techniques    PT Next Visit Plan increase pull on traction and assess response; progress HEP as tolerated.  spinal stabilization exercises.    PT Home Exercise Plan Access Code: [email or web address]    Consulted and Agree with Plan of Care Patient           Patient will benefit from skilled therapeutic intervention in order to improve the following deficits and impairments:  Pain,Hypomobility,Impaired flexibility,Increased fascial restricitons,Decreased range of motion,Decreased strength,Postural dysfunction  Visit Diagnosis: Chronic bilateral low back pain without sciatica  Muscle weakness (generalized)  Pain in right hip  Cervicalgia  Abnormal posture     Problem List Patient Active Problem List   Diagnosis Date Noted  . Seasonal allergies 12/25/2020  . Chest congestion 11/20/2020  . Sinus drainage 11/20/2020  . Bilateral leg edema 11/19/2020  . Costochondritis 10/22/2020  . Rib pain on right side 10/22/2020  . Tachycardia 10/22/2020  . Hepatomegaly 10/22/2020  . Symptomatic mammary hypertrophy 09/04/2020  . Xiphoid pain 08/13/2020  . Sternum pain 08/13/2020  . Class 1 obesity  due to excess calories without serious comorbidity with body mass index (BMI) of 32.0 to  32.9 in adult 08/13/2020  . Macromastia 06/29/2020  . Recurrent major depressive disorder, in partial remission ([place]) 05/24/2020  . Generalized anxiety disorder 05/24/2020  . DDD (degenerative disc disease), cervical 03/13/2020  . Ingrowing left great toenail 03/13/2020  . Labral tear of hip, degenerative 01/11/2020  . Grief reaction 11/28/2019  . Toenail fungus 09/09/2019  . Vasovagal response 09/09/2019  . Decreased visual acuity 08/30/2018  . Right corneal abrasion 08/30/2018  . Elevated fasting glucose 04/08/2018  . Depression   . Anxiety 01/22/2018  . Borderline personality disorder ([place]) 01/22/2018  . Rheumatoid arthritis 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 ([place]) 12/09/2017  . TMJ (temporomandibular joint syndrome) 09/26/2017  . New daily persistent headache 09/26/2017  . MTHFR gene mutation 09/20/2017  . Bipolar I disorder ([place]) 09/16/2017  . Fibromyalgia 09/16/2017  . Chronic fatigue syndrome 09/16/2017  . DYSPNEA 10/15/2011  . Back pain 10/13/2008  . DERMATITIS, ALLERGIC 06/05/2008  . FATIGUE 05/05/2008  . Hypothyroidism 02/10/2008  . 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 DDD (degenerative disc disease), lumbar 02/10/2008  . FIBROMYALGIA 02/10/2008  . IRON, SERUM, ELEVATED 02/10/2008   Kerin Perna, PTA 01/10/21 10:55 AM  [facility] [place], [place], [address] Phone: [phone]   Fax:  [phone]  Name: Frances [name] MRN: [id number] Date of Birth: 02-21-1976

## 2021-01-11 ENCOUNTER — Encounter: Payer: Self-pay | Admitting: Physician Assistant

## 2021-01-11 ENCOUNTER — Other Ambulatory Visit: Payer: Self-pay | Admitting: Physician Assistant

## 2021-01-11 DIAGNOSIS — E6609 Other obesity due to excess calories: Secondary | ICD-10-CM

## 2021-01-11 DIAGNOSIS — Z6832 Body mass index (BMI) 32.0-32.9, adult: Secondary | ICD-10-CM

## 2021-01-15 ENCOUNTER — Other Ambulatory Visit: Payer: Self-pay

## 2021-01-15 ENCOUNTER — Ambulatory Visit (INDEPENDENT_AMBULATORY_CARE_PROVIDER_SITE_OTHER): Payer: No Typology Code available for payment source | Admitting: Physical Therapy

## 2021-01-15 DIAGNOSIS — M25551 Pain in right hip: Secondary | ICD-10-CM

## 2021-01-15 DIAGNOSIS — G8929 Other chronic pain: Secondary | ICD-10-CM

## 2021-01-15 DIAGNOSIS — M6281 Muscle weakness (generalized): Secondary | ICD-10-CM

## 2021-01-15 DIAGNOSIS — M545 Low back pain, unspecified: Secondary | ICD-10-CM

## 2021-01-15 NOTE — Therapy (Addendum)
[place], [place], [address] Phone: [phone]   Fax:  [phone]  Physical Therapy Treatment  Patient Details  Name: Frances [name] MRN: [id number] Date of Birth: 1976/06/05 Referring Provider (PT): Aundria Mems, MD   Encounter Date: 01/15/2021   PT End of Session - 01/15/21 1025    Visit Number 8    Number of Visits 12    Date for PT Re-Evaluation 01/29/21    Authorization Type [employer] discount    PT Start Time 1017    PT Stop Time 1102    PT Time Calculation (min) 45 min    Activity Tolerance Patient tolerated treatment well    Behavior During Therapy [facility] for tasks assessed/performed           Past Medical History:  Diagnosis Date  . Anxiety   . Arthritis   . Back pain, chronic   . Bipolar 1 disorder ([place])   . Chronic fatigue syndrome   . DDD (degenerative disc disease), lumbar   . Depression   . Fibromyalgia   . IUD 2012   Mirena  . MTHFR gene mutation     Past Surgical History:  Procedure Laterality Date  . COLONOSCOPY WITH PROPOFOL N/A 02/25/2017   Procedure: COLONOSCOPY WITH PROPOFOL;  Surgeon: Arta Silence, MD;  Location: [facility];  Service: Endoscopy;  Laterality: N/A;  . left knee lateral release  1993  . scar tisue removal  03/2005   left ankle  . TONSILLECTOMY  age 23's    There were no vitals filed for this visit.   Subjective Assessment - 01/15/21 1025    Subjective Pt reports she walked around her house for 15 min on Saturday; reports having burning/tingling in Lt prox thigh afterwards.    Patient Stated Goals Decrease pain, stand or walk longer    Currently in Pain? Yes    Pain Score 4     Pain Location Back    Pain Orientation Right;Left;Lower    Pain Descriptors / Indicators Sore    Aggravating Factors  varied; prolonged standing    Pain Relieving Factors sometimes stretches              [facility] PT Assessment - 01/15/21 0001       Assessment   Medical Diagnosis Cervical and Lumbar DDD    Referring Provider (PT) Aundria Mems, MD    Onset Date/Surgical Date 12/12/20    Hand Dominance Right    Next MD Visit 01/24/20      AROM   Lumbar Flexion WNL    Lumbar Extension 50% limited   with pain   Lumbar - Right Side Bend WNL    Lumbar - Left Side Bend 25% limited    Lumbar - Right Rotation 50% limited    Lumbar - Left Rotation 50% limited            [facility] Adult PT Treatment/Exercise - 01/15/21 0001      Lumbar Exercises: Stretches   Active Hamstring Stretch 2 reps;10 seconds    Standing Extension 1 rep;5 seconds    Standing Extension Limitations with increased pain.    Other Lumbar Stretch Exercise modified downward dog with hands on counter and walking back x 15-20 sec x 2 reps    Other Lumbar Stretch Exercise standing hip adductor stretch x 15 sec x 2 reps each side, leaning on counter.      Lumbar Exercises: Aerobic   Tread [name] 5 min  at 1.36mh      Lumbar Exercises: Standing   Row Both;10 reps    Theraband Level (Row) Level 3 (Green)    Row Limitations pt reported increased LBP after completing this exercise    Shoulder Extension Both;10 reps    Theraband Level (Shoulder Extension) Level 3 (Green)    Other Standing Lumbar Exercises side stepping with increased step height x 10 ft Rt/Lt; repeated holding yoga block over head    Other Standing Lumbar Exercises Opp arm / leg lift with hands on cabinet x 10      Lumbar Exercises: Sidelying   Other Sidelying Lumbar Exercises open book with red band x 10 reps each side, 3 sec hold, pain-free      Lumbar Exercises: Quadruped   Madcat/Old Horse 5 reps      Moist Heat Therapy   Number Minutes Moist Heat 15 Minutes    Moist Heat Location Lumbar Spine   during traction     Traction   Min (lbs) 60    Max (lbs) 45    Hold Time 60    Rest Time 20    Time 15                       PT Long Term Goals - 01/15/21 1040      PT LONG TERM  GOAL #1   Title The patient will be indep with HEP.    Time 6    Period Weeks    Status On-going      PT LONG TERM GOAL #2   Title The patient will reduce functional limitation per FOTO from 51% to 41%.    Time 6    Period Weeks    Status On-going      PT LONG TERM GOAL #3   Title The patient will tolerate walking x 15 minutes non-stop 2-3 times/week for return to walking program.    Time 6    Period Weeks    Status Partially Met      PT LONG TERM GOAL #4   Title The patient will demonstrate improved ROM in lumbar spine by 25%.    Time 6    Period Weeks    Status Partially Met                 Plan - 01/15/21 1049    Clinical Impression Statement Pt reporting ability to walk non-stop for 15 min, but reported increased radicular symptoms afterwards (short lived). Pt reported some increase in low back pain with resisted row today; reduced with rest.  Lumbar ROM has improved; continued limitation with rotation (no painful, just tight).  Pt tolerated 60# of pull for lumbar traction without any discomfort during treatment, reporting less numbness in Lt thigh, however pt reported increased low back pain after completion of treatment. She reported difficulty/pain with transition off of traction table but symptoms had reduced once she was standing. Overall,pt making gradual gains towards goals.    Rehab Potential Good    PT Frequency 2x / week    PT Duration 6 weeks    PT Treatment/Interventions ADLs/Self Care Home Management;Patient/family education;Therapeutic exercise;Therapeutic activities;Electrical Stimulation;Moist Heat;Traction;Aquatic Therapy;Gait training;Taping;Dry needling;Manual techniques    PT Next Visit Plan increase pull on traction and assess response; progress HEP as tolerated.  spinal stabilization exercises. MD  note/ FOTO    PT Home Exercise Plan Access Code: [email or web address]    Consulted and Agree with Plan of Care Patient  Patient will benefit from  skilled therapeutic intervention in order to improve the following deficits and impairments:  Pain,Hypomobility,Impaired flexibility,Increased fascial restricitons,Decreased range of motion,Decreased strength,Postural dysfunction  Visit Diagnosis: Chronic bilateral low back pain without sciatica  Muscle weakness (generalized)  Pain in right hip     Problem List Patient Active Problem List   Diagnosis Date Noted  . Seasonal allergies 12/25/2020  . Chest congestion 11/20/2020  . Sinus drainage 11/20/2020  . Bilateral leg edema 11/19/2020  . Costochondritis 10/22/2020  . Rib pain on right side 10/22/2020  . Tachycardia 10/22/2020  . Hepatomegaly 10/22/2020  . Symptomatic mammary hypertrophy 09/04/2020  . Xiphoid pain 08/13/2020  . Sternum pain 08/13/2020  . Class 1 obesity due to excess calories without serious comorbidity with body mass index (BMI) of 32.0 to 32.9 in adult 08/13/2020  . Macromastia 06/29/2020  . Recurrent major depressive disorder, in partial remission ([place]) 05/24/2020  . Generalized anxiety disorder 05/24/2020  . DDD (degenerative disc disease), cervical 03/13/2020  . Ingrowing left great toenail 03/13/2020  . Labral tear of hip, degenerative 01/11/2020  . Grief reaction 11/28/2019  . Toenail fungus 09/09/2019  . Vasovagal response 09/09/2019  . Decreased visual acuity 08/30/2018  . Right corneal abrasion 08/30/2018  . Elevated fasting glucose 04/08/2018  . Depression   . Anxiety 01/22/2018  . Borderline personality disorder ([place]) 01/22/2018  . Rheumatoid arthritis 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 ([place]) 12/09/2017  . TMJ (temporomandibular joint syndrome) 09/26/2017  . New daily persistent headache 09/26/2017  . MTHFR gene mutation 09/20/2017  . Bipolar I disorder ([place]) 09/16/2017  . Fibromyalgia 09/16/2017  . Chronic fatigue syndrome 09/16/2017  . DYSPNEA 10/15/2011  . Back pain 10/13/2008  . DERMATITIS, ALLERGIC 06/05/2008  .  FATIGUE 05/05/2008  . Hypothyroidism 02/10/2008  . 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 DDD (degenerative disc disease), lumbar 02/10/2008  . FIBROMYALGIA 02/10/2008  . IRON, SERUM, ELEVATED 02/10/2008   Kerin Perna, PTA 01/15/21 10:57 AM  [facility] [place], [place], [address] Phone: [phone]   Fax:  [phone]  Name: Frances [name] MRN: [id number] Date of Birth: [birth year]

## 2021-01-17 ENCOUNTER — Ambulatory Visit (INDEPENDENT_AMBULATORY_CARE_PROVIDER_SITE_OTHER): Payer: No Typology Code available for payment source | Admitting: Physical Therapy

## 2021-01-17 ENCOUNTER — Other Ambulatory Visit: Payer: Self-pay

## 2021-01-17 DIAGNOSIS — M545 Low back pain, unspecified: Secondary | ICD-10-CM

## 2021-01-17 DIAGNOSIS — M6281 Muscle weakness (generalized): Secondary | ICD-10-CM

## 2021-01-17 DIAGNOSIS — R293 Abnormal posture: Secondary | ICD-10-CM

## 2021-01-17 DIAGNOSIS — G8929 Other chronic pain: Secondary | ICD-10-CM

## 2021-01-17 NOTE — Therapy (Addendum)
[facility] [address] [place], [place], [address] Phone: [phone]   Fax:  [phone]  Physical Therapy Treatment/ Discharge Summary  Patient Details  Name: Frances [name] MRN: [id number] Date of Birth: [birth year] Referring Provider (PT): Aundria Mems, MD   Encounter Date: 01/17/2021   PT End of Session - 01/17/21 1055    Visit Number 9    Number of Visits 12    Date for PT Re-Evaluation 01/29/21    Authorization Type [employer] discount    PT Start Time 1018    PT Stop Time 1102   MHP last 10 min   PT Time Calculation (min) 44 min    Activity Tolerance Patient tolerated treatment well    Behavior During Therapy [facility] for tasks assessed/performed           Past Medical History:  Diagnosis Date  . Anxiety   . Arthritis   . Back pain, chronic   . Bipolar 1 disorder ([place])   . Chronic fatigue syndrome   . DDD (degenerative disc disease), lumbar   . Depression   . Fibromyalgia   . IUD 2012   Mirena  . MTHFR gene mutation     Past Surgical History:  Procedure Laterality Date  . COLONOSCOPY WITH PROPOFOL N/A 02/25/2017   Procedure: COLONOSCOPY WITH PROPOFOL;  Surgeon: Arta Silence, MD;  Location: [facility];  Service: Endoscopy;  Laterality: N/A;  . left knee lateral release  1993  . scar tisue removal  03/2005   left ankle  . TONSILLECTOMY  age 45's    There were no vitals filed for this visit.   Subjective Assessment - 01/17/21 1022    Subjective [name] reports 45 that the numbness in her LLE would travel to "the top of the knee" and over the last few weeks it now only travels to the "top of the thigh".   She's unsure what has helped it the most.  She complains of increased soreness in her midback beginning yesterday afternoon.  She reports the pain in low back after traction resolved once she walked around.  She was able to walk for 15 min yesterday without the burning in her LLE, however she noticed  it while driving in her car this morning, "it's random".    Patient Stated Goals Decrease pain, stand or walk longer    Currently in Pain? No/denies    Pain Score 0-No pain              [facility] PT Assessment - 01/17/21 0001      Assessment   Medical Diagnosis Cervical and Lumbar DDD    Referring Provider (PT) Aundria Mems, MD    Onset Date/Surgical Date 12/12/20    Hand Dominance Right    Next MD Visit 01/24/20           [facility] Adult PT Treatment/Exercise - 01/17/21 0001      Lumbar Exercises: Stretches   Prone on Elbows Stretch 1 rep;20 seconds      Lumbar Exercises: Aerobic   Tread Mill 5 min at 2.0 mph      Lumbar Exercises: Supine   Bridge 10 reps;5 seconds      Lumbar Exercises: Sidelying   Clam Right;Left;10 reps;2 seconds    Other Sidelying Lumbar Exercises open book x 8 reps each side;  red band with Rt rotation increased LBP, continued without band, and without band and pain.      Lumbar Exercises: Quadruped   Opposite  Arm/Leg Raise Right arm/Left leg;Left arm/Right leg;5 reps;3 seconds      Moist Heat Therapy   Number Minutes Moist Heat 10 Minutes    Moist Heat Location Lumbar Spine      Manual Therapy   Manual Therapy Joint mobilization;Soft tissue mobilization    Manual therapy comments skilled palpation and monitoring of soft tissues during DN    Joint Mobilization PA mobs to bil lumbar gd 3    Soft tissue mobilization bil lumbar            Trigger Point Dry Needling - 01/17/21 0001    Consent Given? Yes    Education Handout Provided Yes    Muscles Treated Back/Hip Erector spinae;Lumbar multifidi;Quadratus lumborum    Erector spinae Response Palpable increased muscle length    Lumbar multifidi Response Twitch response elicited;Palpable increased muscle length    Quadratus Lumborum Response Twitch response elicited;Palpable increased muscle length                PT Education - 01/17/21 1057    Education Details reviewed DN info     Person(s) Educated Patient    Methods Explanation;Handout    Comprehension Verbalized understanding               PT Long Term Goals - 01/17/21 1040      PT LONG TERM GOAL #1   Title The patient will be indep with HEP.    Time 6    Period Weeks    Status Partially Met      PT LONG TERM GOAL #2   Title The patient will reduce functional limitation per FOTO from 51% to 41%.    Baseline FOTO for neck; have been treating low back.    Time 6    Period Weeks    Status Deferred      PT LONG TERM GOAL #3   Title The patient will tolerate walking x 15 minutes non-stop 2-3 times/week for return to walking program.    Time 6    Period Weeks    Status Achieved      PT LONG TERM GOAL #4   Title The patient will demonstrate improved ROM in lumbar spine by 25%.    Baseline continued limitation with Rt sidebend and bilat rotation    Time 6    Period Weeks    Status Partially Met                 Plan - 01/17/21 1038    Clinical Impression Statement Pt able to tolerate walking 15 min, no stop without increase in pain; has met LTG#3.  Per pt report she has been more compliant with HEP and walking, noting overall improvement in stamina of standing, strength, and pain levels.  No significant change in radicular symptoms with traction and pt unable to tolerate increased pull due to increased muscle guarding/soreness.  Trial of DN performed by supervising PT, Madelyn Flavors with good twitch response in lumbar musculature.  Pt not interested in aquatic therapy at this time.  Pt has partially met her goals.    Rehab Potential Good    PT Frequency 2x / week    PT Duration 6 weeks    PT Treatment/Interventions ADLs/Self Care Home Management;Patient/family education;Therapeutic exercise;Therapeutic activities;Electrical Stimulation;Moist Heat;Traction;Aquatic Therapy;Gait training;Taping;Dry needling;Manual techniques    PT Next Visit Plan Will await further advisement from MD.    PT Home  Exercise Plan Access Code: [email or web address]    Consulted and Agree with  Plan of Care Patient           Patient will benefit from skilled therapeutic intervention in order to improve the following deficits and impairments:  Pain,Hypomobility,Impaired flexibility,Increased fascial restricitons,Decreased range of motion,Decreased strength,Postural dysfunction,Decreased knowledge of precautions  Visit Diagnosis: Chronic bilateral low back pain without sciatica  Muscle weakness (generalized)  Abnormal posture     Problem List Patient Active Problem List   Diagnosis Date Noted  . Seasonal allergies 12/25/2020  . Chest congestion 11/20/2020  . Sinus drainage 11/20/2020  . Bilateral leg edema 11/19/2020  . Costochondritis 10/22/2020  . Rib pain on right side 10/22/2020  . Tachycardia 10/22/2020  . Hepatomegaly 10/22/2020  . Symptomatic mammary hypertrophy 09/04/2020  . Xiphoid pain 08/13/2020  . Sternum pain 08/13/2020  . Class 1 obesity due to excess calories without serious comorbidity with body mass index (BMI) of 32.0 to 32.9 in adult 08/13/2020  . Macromastia 06/29/2020  . Recurrent major depressive disorder, in partial remission ([place]) 05/24/2020  . Generalized anxiety disorder 05/24/2020  . DDD (degenerative disc disease), cervical 03/13/2020  . Ingrowing left great toenail 03/13/2020  . Labral tear of hip, degenerative 01/11/2020  . Grief reaction 11/28/2019  . Toenail fungus 09/09/2019  . Vasovagal response 09/09/2019  . Decreased visual acuity 08/30/2018  . Right corneal abrasion 08/30/2018  . Elevated fasting glucose 04/08/2018  . Depression   . Anxiety 01/22/2018  . Borderline personality disorder ([place]) 01/22/2018  . Rheumatoid arthritis 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 ([place]) 12/09/2017  . TMJ (temporomandibular joint syndrome) 09/26/2017  . New daily persistent headache 09/26/2017  . MTHFR gene mutation 09/20/2017  . Bipolar I disorder ([place])  09/16/2017  . Fibromyalgia 09/16/2017  . Chronic fatigue syndrome 09/16/2017  . DYSPNEA 10/15/2011  . Back pain 10/13/2008  . DERMATITIS, ALLERGIC 06/05/2008  . FATIGUE 05/05/2008  . Hypothyroidism 02/10/2008  . 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 DDD (degenerative disc disease), lumbar 02/10/2008  . FIBROMYALGIA 02/10/2008  . IRON, SERUM, ELEVATED 02/10/2008     PHYSICAL THERAPY DISCHARGE SUMMARY  Visits from Start of Care: 9  Current functional level related to goals / functional outcomes: See goals above   Remaining deficits: Continued low back pain   Education / Equipment: HEP  Plan:                                                    Patient goals were not met. Patient is being discharged due to a change in medical status.  ?????        Thank you for the referral of this patient. Rudell Cobb, MPT  Kerin Perna, [place] 01/17/21 10:58 AM  [facility] [address] [place], [place], [address] Phone: [phone]   Fax:  [phone]  Name: VICKIE MELNIK MRN: [id number] Date of Birth: 1976/01/12  Madelyn Flavors, PT 01/17/21 11:02 AM  [facility] at [facility] [address], [place] [address]  [phone] (office) [phone] (fax)

## 2021-01-17 NOTE — Patient Instructions (Signed)

## 2021-01-22 MED FILL — CYMBALTA 30 MG CAPSULE,DELAYED RELEASE: ORAL | 30 days supply | Qty: 60 | Fill #2

## 2021-01-22 MED FILL — DICLOFENAC SODIUM 75 MG TABLET,DELAYED RELEASE: ORAL | 30 days supply | Qty: 60 | Fill #5

## 2021-01-23 ENCOUNTER — Other Ambulatory Visit: Payer: Self-pay

## 2021-01-23 ENCOUNTER — Ambulatory Visit (INDEPENDENT_AMBULATORY_CARE_PROVIDER_SITE_OTHER): Payer: No Typology Code available for payment source | Admitting: Sports Medicine

## 2021-01-23 DIAGNOSIS — M5136 Other intervertebral disc degeneration, lumbar region: Secondary | ICD-10-CM

## 2021-01-23 DIAGNOSIS — M51369 Other intervertebral disc degeneration, lumbar region without mention of lumbar back pain or lower extremity pain: Secondary | ICD-10-CM

## 2021-01-23 DIAGNOSIS — M5030 Other cervical disc degeneration, unspecified cervical region: Secondary | ICD-10-CM

## 2021-01-23 MED ORDER — GABAPENTIN 800 MG PO TABS: 800.0000 mg | ORAL_TABLET | Freq: Three times a day (TID) | ORAL | 3 refills | Status: DC

## 2021-01-23 NOTE — Assessment & Plan Note (Signed)
Frances [name] is a pleasant 45 year old female, at this point we have done extensive conservative treatment for her lumbar degenerative disc disease. We have done months of physical therapy, multiple epidurals, gabapentin at 600 mg 3 times daily, going up to 800 mg 3 times daily. Unfortunately she continues to have significant discomfort, mostly axial pain with minimal radicular symptoms into the left lateral thigh but not really past the knee. She does have a moderate sized central L5-S1 disc herniation and a broad-based protrusion at L4-L5, because of this I would like her to touch base with Dr. Louanne Skye to see if a microdiscectomy would be beneficial for her.

## 2021-01-23 NOTE — Progress Notes (Signed)
    Procedures performed today:    None.  Independent interpretation of notes and tests performed by another provider:   Lumbar spine MRI again reviewed, she does have multilevel small disc protrusions,, the most dominant findings are a moderate to large central disc herniation at L5-S1 and a broad-based disc protrusion at L4-L5.  Brief History, Exam, Impression, and Recommendations:    DDD (degenerative disc disease), lumbar Frances [name] is a pleasant 45 year old female, at this point we have done extensive conservative treatment for her lumbar degenerative disc disease. We have done months of physical therapy, multiple epidurals, gabapentin at 600 mg 3 times daily, going up to 800 mg 3 times daily. Unfortunately she continues to have significant discomfort, mostly axial pain with minimal radicular symptoms into the left lateral thigh but not really past the knee. She does have a moderate sized central L5-S1 disc herniation and a broad-based protrusion at L4-L5, because of this I would like her to touch base with Dr. Louanne Skye to see if a microdiscectomy would be beneficial for her.  DDD (degenerative disc disease), cervical Frances [name] continues to have some neck and thoracic pain, she really does not have much in the line of cervical degenerative changes, potentially her left-sided C7-T1 facet does look a bit hypertrophic on MRI. I think her bigger problem is macromastia, we have been trying to get her approved for breast reduction surgery but this is proven difficult, I would certainly recommend breast reduction before considering any intervention into her cervical spine.    ___________________________________________ Gwen Her. Dianah Field, M.D., ABFM., CAQSM. Primary Care and [place] Instructor of [employer] [facility]

## 2021-01-23 NOTE — Assessment & Plan Note (Signed)
Frances [name] continues to have some neck and thoracic pain, she really does not have much in the line of cervical degenerative changes, potentially her left-sided C7-T1 facet does look a bit hypertrophic on MRI. I think her bigger problem is macromastia, we have been trying to get her approved for breast reduction surgery but this is proven difficult, I would certainly recommend breast reduction before considering any intervention into her cervical spine.

## 2021-01-24 ENCOUNTER — Encounter (HOSPITAL_COMMUNITY): Payer: Self-pay | Admitting: Psychiatry

## 2021-01-24 ENCOUNTER — Other Ambulatory Visit: Payer: Self-pay | Admitting: Physician Assistant

## 2021-01-24 ENCOUNTER — Telehealth (INDEPENDENT_AMBULATORY_CARE_PROVIDER_SITE_OTHER): Payer: No Payment, Other | Admitting: Psychiatry

## 2021-01-24 DIAGNOSIS — F319 Bipolar disorder, unspecified: Secondary | ICD-10-CM | POA: Diagnosis not present

## 2021-01-24 DIAGNOSIS — F411 Generalized anxiety disorder: Secondary | ICD-10-CM

## 2021-01-24 DIAGNOSIS — F3341 Major depressive disorder, recurrent, in partial remission: Secondary | ICD-10-CM | POA: Diagnosis not present

## 2021-01-24 MED ORDER — TRAZODONE HCL 50 MG PO TABS: 50.0000 mg | ORAL_TABLET | Freq: Every evening | ORAL | 2 refills | Status: DC | PRN

## 2021-01-24 MED ORDER — BENZTROPINE MESYLATE 0.5 MG PO TABS: 0.5000 mg | ORAL_TABLET | Freq: Every day | ORAL | 2 refills | Status: DC

## 2021-01-24 MED ORDER — BUPROPION HCL ER (XL) 300 MG PO TB24
300.0000 mg | ORAL_TABLET | Freq: Every day | ORAL | 2 refills | Status: DC
Start: 2021-01-24 — End: 2021-04-24

## 2021-01-24 MED ORDER — ARIPIPRAZOLE 15 MG PO TABS
15.0000 mg | ORAL_TABLET | Freq: Every day | ORAL | 2 refills | Status: DC
Start: 2021-01-24 — End: 2021-04-24

## 2021-01-24 NOTE — Progress Notes (Signed)
BH MD/PA/NP OP Progress Note Virtual Visit via Video Note  I connected with Frances [name] on 01/24/21 at 10:30 AM EST by a video enabled telemedicine application and verified that I am speaking with the correct person using two identifiers.  Location: Patient: Home Provider: Clinic   I discussed the limitations of evaluation and management by telemedicine and the availability of in person appointments. The patient expressed understanding and agreed to proceed.  I provided 30 minutes of non-face-to-face time during this encounter.     01/24/2021 10:39 AM Frances [name]  MRN:  [id number]  Chief Complaint: "My depression comes and goes"  HPI: 45 year old female seen today for follow up psychiatric evaluation. She has a psychiatric history of bipolar 1, borderline personality disorder, depression, and anxiety. She is currently managed on Abilify 15 mg nightly, Cogentin 0.5 mg daily, Wellbutrin 300 mg daily, trazodone 50-100 mg nightly, and Cymbalta 30 mg daily (from rheumatologist), and gabapentin 800 mg three times daily (from rheumatologist which she recently started for fibromyalgia). Today, patient notes that medications are effective in managing her symptoms.   Today, the patient is well groomed, pleasant, cooperative, and engaged in conversation. Patient notes that her depression comes and goes but reports that overall she is doing well.  Provider conducted a PHQ-9  and patient scored a 10, at her last visit she scored a 13. A GAD-7 was also conducted and patient scored 3, at her last visit she also scored a three.  She denies SI/HI/VAH/paranoia. She endorses adequate sleep and appetite.   No medication changes made today. She is agreeable to continuing all medications as prescribed. Patient will follow up with outpatient counselor for therapy. No other concerns noted at this time.   Visit Diagnosis:    ICD-10-CM   1. Bipolar I disorder ([place])  F31.9   2. Recurrent major  depressive disorder, in partial remission ([place])  F33.41   3. Generalized anxiety disorder  F41.1     Past Psychiatric History: Bipolar, anxiety, and depression Past Medical History:  Past Medical History:  Diagnosis Date  . Anxiety   . Arthritis   . Back pain, chronic   . Bipolar 1 disorder ([place])   . Chronic fatigue syndrome   . DDD (degenerative disc disease), lumbar   . Depression   . Fibromyalgia   . IUD 2012   Mirena  . MTHFR gene mutation     Past Surgical History:  Procedure Laterality Date  . COLONOSCOPY WITH PROPOFOL N/A 02/25/2017   Procedure: COLONOSCOPY WITH PROPOFOL;  Surgeon: Arta Silence, MD;  [facility];  Service: Endoscopy;  Laterality: N/A;  . left knee lateral release  1993  . scar tisue removal  03/2005   left ankle  . TONSILLECTOMY  age 89's    Family Psychiatric History: Maternal aunts Bipolar disorder and uncle alcoholic  Family History:  Family History  Problem Relation Age of Onset  . Diabetes Mother   . Stroke Mother   . Lupus Mother   . Hypertension Mother   . Congestive Heart Failure Mother   . Mental illness Brother        Not clear what his diagnosis is--may be related to previous drug use.  . Cancer Maternal Grandmother        colon  . Depression Maternal Uncle   . Alcohol abuse Maternal Uncle   . Diabetes Maternal Grandfather     Social History:  Social History   Socioeconomic History  . Marital  status: Single    Spouse name: Not on file  . Number of children: 0  . Years of education: Not on file  . Highest education level: Bachelor's degree (e.g., BA, AB, BS)  Occupational History  . Occupation: Designer, industrial/product at times  Tobacco Use  . Smoking status: Never Smoker  . Smokeless tobacco: Never Used  Vaping Use  . Vaping Use: Never used  Substance and Sexual Activity  . Alcohol use: No    Alcohol/week: 0.0 standard drinks  . Drug use: No  . Sexual activity: Yes    Birth control/protection: I.U.D.  Other Topics  Concern  . Not on file  Social History Narrative   Originally from [place], outside of [place].    Moved here permanently 2012.   Intermittently worked on a farm.   Lives with many cats--3 plus fosters cats.    Single, lives alone in a one story home. Rarely drinks caffeine. 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   Social Determinants of Health   Financial Resource Strain: Medium Risk  . Difficulty of Paying Living Expenses: Somewhat hard  Food Insecurity: No Food Insecurity  . Worried About Charity fundraiser in the Last Year: Never true  . Ran Out of Food in the Last Year: Never true  Transportation Needs: No Transportation Needs  . Lack of Transportation (Medical): No  . Lack of Transportation (Non-Medical): No  Physical Activity: Inactive  . Days of Exercise per Week: 0 days  . Minutes of Exercise per Session: 0 min  Stress: No Stress Concern Present  . Feeling of Stress : Not at all  Social Connections: Moderately Isolated  . Frequency of Communication with Friends and Family: More than three times a week  . Frequency of Social Gatherings with Friends and Family: More than three times a week  . Attends Religious Services: Never  . Active Member of Clubs or Organizations: Yes  . Attends Archivist Meetings: More than 4 times per year  . Marital Status: Never married    Allergies:  Allergies  Allergen Reactions  . Metaxalone Hives and Other (See Comments)    Metabolic Disorder Labs: Lab Results  Component Value Date   HGBA1C 5.2 04/14/2018   MPG 103 04/14/2018   No results found for: PROLACTIN Lab Results  Component Value Date   CHOL 130 04/14/2018   TRIG 192 (H) 04/14/2018   HDL 28 (L) 04/14/2018   CHOLHDL 4.6 04/14/2018   LDLCALC 73 04/14/2018   LDLCALC 99 03/04/2016   Lab Results  Component Value Date   TSH 0.866 11/21/2020   TSH 1.616 01/27/2018    Therapeutic Level Labs: No results found for: LITHIUM No  results found for: VALPROATE No components found for:  CBMZ  Current Medications: Current Outpatient Medications  Medication Sig Dispense Refill  . ARIPiprazole (ABILIFY) 15 MG tablet Take 1 tablet (15 mg total) by mouth daily. 30 tablet 2  . azelastine (ASTELIN) 0.1 % nasal spray Place 2 sprays into both nostrils 2 (two) times daily. Use in each nostril as directed 30 mL 2  . benztropine (COGENTIN) 0.5 MG tablet Take 1 tablet (0.5 mg total) by mouth daily. 30 tablet 2  . buPROPion (WELLBUTRIN XL) 300 MG 24 hr tablet Take 1 tablet (300 mg total) by mouth daily. 30 tablet 2  . cholecalciferol (VITAMIN D3) 25 MCG (1000 UT) tablet Take 1,000 Units by mouth daily.    . diclofenac (VOLTAREN) 75 MG EC  tablet TAKE 1 TABLET BY MOUTH TWICE A DAY 60 tablet 3  . DULoxetine (CYMBALTA) 60 MG capsule Take 60 mg by mouth daily.    . famotidine (PEPCID) 20 MG tablet Take 20 mg by mouth daily.    . fluticasone (FLONASE) 50 MCG/ACT nasal spray Place 2 sprays into both nostrils daily. 16 g 1  . gabapentin (NEURONTIN) 800 MG tablet Take 1 tablet (800 mg total) by mouth 3 (three) times daily. 90 tablet 3  . hydrochlorothiazide (HYDRODIURIL) 12.5 MG tablet Take 1 tablet (12.5 mg total) by mouth daily. 30 tablet 2  . HYDROcodone-acetaminophen (NORCO/VICODIN) 5-325 MG tablet Take 1 tablet by mouth 2 (two) times daily as needed for moderate pain (back pain). 60 tablet 0  . levocetirizine (XYZAL) 5 MG tablet Take 1 tablet (5 mg total) by mouth every evening. 90 tablet 0  . methocarbamol (ROBAXIN) 500 MG tablet Take 1 tablet (500 mg total) by mouth 3 (three) times daily. 90 tablet 1  . montelukast (SINGULAIR) 10 MG tablet TAKE 1 Tablet BY MOUTH ONCE EVERY NIGHT AT BEDTIME 90 tablet 1  . pantoprazole (PROTONIX) 40 MG tablet TAKE 1 TABLET BY MOUTH DAILY 90 tablet 1  . polyethylene glycol (MIRALAX / GLYCOLAX) 17 g packet Take 17 g by mouth daily.    [name] topiramate (TOPAMAX) 25 MG tablet Take one tablet at bedtime. 30 tablet  1  . traZODone (DESYREL) 50 MG tablet Take 1 tablet (50 mg total) by mouth at bedtime. 60 tablet 2   No current facility-administered medications for this visit.     Musculoskeletal: Strength & Muscle Tone: Unable to assess due to telehealth visit [place]: Unable to assess due to telehealth visit Patient leans: N/A  Psychiatric Specialty Exam: Review of Systems  There were no vitals taken for this visit.There is no height or weight on file to calculate BMI.  General Appearance: Well Groomed  Eye Contact:  Good  Speech:  Clear and Coherent and Normal Rate  Volume:  Normal  Mood:  Euthymic  Affect:  Congruent  Thought Process:  Coherent, Goal Directed and Linear  Orientation:  Full (Time, Place, and Person)  Thought Content: WDL and Logical   Suicidal Thoughts:  No  Homicidal Thoughts:  No  Memory:  Immediate;   Good Recent;   Good Remote;   Good  Judgement:  Good  Insight:  Good  Psychomotor Activity:  Unable to assess due to telehealth visit  Concentration:  Concentration: Good and Attention Span: Good  Recall:  Good  Fund of Knowledge: Good  Language: Good  Akathisia:  No  Handed:  Right  AIMS (if indicated): Not done  Assets:  Communication Skills Desire for Improvement Financial Resources/Insurance Housing Social Support  ADL's:  Intact  Cognition: WNL  Sleep:  Good   Screenings: AIMS   Flowsheet Row Admission (Discharged) from 01/25/2018 in [place] [facility]  AIMS Total Score 0    AUDIT   Flowsheet Row Admission (Discharged) from 01/25/2018 in [facility]  Alcohol Use Disorder Identification Test Final Score (AUDIT) 0    GAD-7   Flowsheet Row Video Visit from 01/24/2021 in [facility] Counselor from 12/06/2020 in [facility] Counselor from 10/04/2020 in [facility] Office Visit from 11/28/2019 in [place] Visit from 09/09/2019 in [place]  Total GAD-7 Score 3  3 3 4 2     PHQ2-9   Flowsheet Row Video Visit from 01/24/2021 in [facility] Counselor from 12/06/2020 in [facility] Counselor from 10/04/2020 in [facility] Counselor from 05/24/2020 in [facility] Office Visit from 03/27/2020 in [facility]  PHQ-2 Total Score 3 3 4 2 3   PHQ-9 Total Score 10 11 12 11 13        Assessment and Plan: Patient reports that she is doing well on current medication regimen. No medication adjustments made today.  She is agreeable to continue medications as prescribed and requests refills.  Provider sent refills to requested pharmacies.  1. Bipolar I disorder (HCC)  Continue- ARIPiprazole (ABILIFY) 15 MG tablet; Take 1 tablet (15 mg total) by mouth daily.  Dispense: 30 tablet; Refill: 2 Continue- benztropine (COGENTIN) 0.5 MG tablet; Take 1 tablet (0.5 mg total) by mouth daily.  Dispense: 30 tablet; Refill: 2  2. Recurrent major depressive disorder, in partial remission (HCC)  Continue- ARIPiprazole (ABILIFY) 15 MG tablet; Take 1 tablet (15 mg total) by mouth daily.  Dispense: 30 tablet; Refill: 2 Continue- traZODone (DESYREL) 50 MG tablet; Take 1 tablet (50 mg total) by mouth at bedtime.  Dispense: 60 tablet; Refill: 2  3. Generalized anxiety disorder  Continue- buPROPion (WELLBUTRIN XL) 300 MG 24 hr tablet; Take 1 tablet (300 mg total) by mouth daily.  Dispense: 30 tablet; Refill: 2    Follow up in 3 moths Follow up with therapy   Salley Slaughter, NP 01/24/2021, 10:39 AM

## 2021-01-30 ENCOUNTER — Other Ambulatory Visit: Payer: Self-pay

## 2021-01-30 ENCOUNTER — Ambulatory Visit (INDEPENDENT_AMBULATORY_CARE_PROVIDER_SITE_OTHER): Payer: No Payment, Other | Admitting: Licensed Clinical Social Worker

## 2021-01-30 DIAGNOSIS — F319 Bipolar disorder, unspecified: Secondary | ICD-10-CM

## 2021-01-30 DIAGNOSIS — F603 Borderline personality disorder: Secondary | ICD-10-CM

## 2021-01-30 NOTE — Progress Notes (Addendum)
   THERAPIST PROGRESS NOTE  Virtual Visit via Video Note  I connected with Para March on 01/30/21 at  1:00 PM EST by a video enabled telemedicine application and verified that I am speaking with the correct person using two identifiers.  Location: Patient: Frances [name] Provider: [facility]    I discussed the limitations of evaluation and management by telemedicine and the availability of in person appointments. The patient expressed understanding and agreed to proceed.  Session Time: 101  Therapist Response:    Subjective/Objective:  Pt was alert and oriented x 5. She was dressed casually and fairly groomed. Pt presented today with flat and depressed mood/affect. She was cooperative, quiet, and avoided eye contact in session.   Pt primary stressor today was trauma of her father leaving. LCSW spoke with pt about goals. [name] stated that she "does not know why someone would leave their family like garbage". She reports that she blames herself for him leaving and believes that she is not good enough.   Intervention/Plan: CBT intervention was completed with pt. Pt to write down 5 x "This is not my fault; this is his fault". Pt stated that she was agreeable to try this for next time. Goal is to decrease sadness, tension, and worry with the objective of telling the story of her father leaving without blaming herself.    Assessment/Plan: Pt endorse sadness, anger, irritability, isolation, worthlessness and hopelessness. She currently meets criteria for bipolar disorder depressive and BPD. She has been taking medication as directed. Plan listed above pt will f/u in 2 weeks.    I discussed the assessment and treatment plan with the patient. The patient was provided an opportunity to ask questions and all were answered. The patient agreed with the plan and demonstrated an understanding of the instructions.   The patient was advised to call back or seek an in-person evaluation if the  symptoms worsen or if the condition fails to improve as anticipated.  I provided 45 minutes of non-face-to-face time during this encounter.   [name], LCSW    Participation Level: Minimal  Behavioral Response: CasualAlertDepressed and flat   Type of Therapy: Individual Therapy  Treatment Goals addressed: Anxiety  Interventions: CBT and Supportive  Summary: Frances [name] is a 45 y.o. female who presents with GAD bipolar disorder 1.   Suicidal/Homicidal: Nowithout intent/plan   Plan: Return again in 4 weeks.      [name], LCSW 01/30/2021

## 2021-02-06 ENCOUNTER — Encounter: Payer: Self-pay | Admitting: Physician Assistant

## 2021-02-06 ENCOUNTER — Other Ambulatory Visit: Payer: Self-pay | Admitting: Physician Assistant

## 2021-02-06 DIAGNOSIS — M5137 Other intervertebral disc degeneration, lumbosacral region: Secondary | ICD-10-CM

## 2021-02-06 DIAGNOSIS — M0609 Rheumatoid arthritis without rheumatoid factor, multiple sites: Secondary | ICD-10-CM

## 2021-02-06 DIAGNOSIS — M5136 Other intervertebral disc degeneration, lumbar region: Secondary | ICD-10-CM

## 2021-02-06 DIAGNOSIS — M25551 Pain in right hip: Secondary | ICD-10-CM

## 2021-02-06 DIAGNOSIS — R0989 Other specified symptoms and signs involving the circulatory and respiratory systems: Secondary | ICD-10-CM

## 2021-02-06 DIAGNOSIS — J3489 Other specified disorders of nose and nasal sinuses: Secondary | ICD-10-CM

## 2021-02-06 DIAGNOSIS — G8929 Other chronic pain: Secondary | ICD-10-CM

## 2021-02-06 DIAGNOSIS — M545 Low back pain, unspecified: Secondary | ICD-10-CM

## 2021-02-06 DIAGNOSIS — J310 Chronic rhinitis: Secondary | ICD-10-CM

## 2021-02-07 MED ORDER — HYDROCODONE-ACETAMINOPHEN 5-325 MG PO TABS: 1.0000 | ORAL_TABLET | Freq: Two times a day (BID) | ORAL | 0 refills | Status: DC | PRN

## 2021-02-07 NOTE — Telephone Encounter (Signed)
Last written 09/04/2020 #60 with no refills by Dr. Darene Lamer Please advise.

## 2021-02-13 ENCOUNTER — Other Ambulatory Visit: Payer: Self-pay

## 2021-02-13 ENCOUNTER — Ambulatory Visit (INDEPENDENT_AMBULATORY_CARE_PROVIDER_SITE_OTHER): Payer: No Payment, Other | Admitting: Licensed Clinical Social Worker

## 2021-02-13 DIAGNOSIS — F603 Borderline personality disorder: Secondary | ICD-10-CM | POA: Diagnosis not present

## 2021-02-13 DIAGNOSIS — F319 Bipolar disorder, unspecified: Secondary | ICD-10-CM

## 2021-02-13 NOTE — Progress Notes (Signed)
   THERAPIST PROGRESS NOTE  Virtual Visit via Video Note  I connected with Frances [name] on 02/13/21 at  1:00 PM EST by a video enabled telemedicine application and verified that I am speaking with the correct person using two identifiers.  Location: Patient: [facility]  Provider: [facility]    I discussed the limitations of evaluation and management by telemedicine and the availability of in person appointments. The patient expressed understanding and agreed to proceed.  Session Time: 22   Therapist Response:    Subjective/Objective:  Pt is alert and oriented x 5. She was addressed casually and engaged well in therapy session. Frances [name] presented with flat mood/affect. She was cooperative and maintained good eye contact.   Pt primary stressor today is unresolved problems with the abandonment of her father and her horse getting sick. Frances [name] has been dealing with her horse for the past 4 months. She fears that if things continue the path they are, euthanasia needs to be considered. Frances [name] reports that there is still alternative medication route to be pursued. She also states that she has anxiety, tension, and worry about talking regarding her father. She states that she can talk about with her friend just fine, but in therapy she struggles.   Intervention/Plan: Primary intervention today was supportive therapy. Plan moving forward is to walk 4 x per week for 30 minutes, walk/ride horse 3 x weekly, attend 2 peer support groups and speak up twice in those groups, decrease PHQ-9 below 10.   Assessment/Plan: Pt endorses symptoms for depression, anxiety, fear of abandonment, antisocial behavior, Irritability, and social isolation. She meets criteria for bipolar 1 in partial remission and BPD. Pt is taking medications as directed.    I discussed the assessment and treatment plan with the patient. The patient was provided an opportunity to ask questions and all were answered. The patient  agreed with the plan and demonstrated an understanding of the instructions.   The patient was advised to call back or seek an in-person evaluation if the symptoms worsen or if the condition fails to improve as anticipated.  I provided 45 minutes of non-face-to-face time during this encounter.   [name], LCSW     Participation Level: Active  Behavioral Response: CasualAlertAnxious  Type of Therapy: Individual Therapy  Treatment Goals addressed: Diagnosis: Bipolar 1   Interventions: CBT and Solution Focused  Summary: Frances [name] is a 45 y.o. female who presents with bipolar 1 disorder .   Suicidal/Homicidal: NAwithout intent/plan  Plan: Return again in 4 weeks.     [name], LCSW 02/13/2021

## 2021-02-18 DIAGNOSIS — M797 Fibromyalgia: Principal | ICD-10-CM

## 2021-02-18 MED ORDER — DULOXETINE 30 MG CAPSULE,DELAYED RELEASE
ORAL_CAPSULE | Freq: Every day | ORAL | 2 refills | 30.00000 days | Status: CP
Start: 2021-02-18 — End: 2021-05-19
  Filled 2021-02-19: qty 60, 30d supply, fill #0

## 2021-02-19 MED FILL — DICLOFENAC SODIUM 75 MG TABLET,DELAYED RELEASE: ORAL | 30 days supply | Qty: 60 | Fill #6

## 2021-02-19 MED FILL — FAMOTIDINE 40 MG TABLET: ORAL | 60 days supply | Qty: 60 | Fill #1

## 2021-02-20 LAB — HEPATIC FUNCTION PANEL
ALT: 45 — AB (ref 7–35)
AST: 36 — AB (ref 13–35)
Alkaline Phosphatase: 57 (ref 25–125)

## 2021-02-20 LAB — BASIC METABOLIC PANEL
BUN: 15 (ref 4–21)
CO2: 30 — AB (ref 13–22)
Chloride: 101 (ref 99–108)
Creatinine: 0.8 (ref <=1.1)
Glucose: 103
Potassium: 4.2 (ref 3.4–5.3)
Sodium: 141 (ref 137–147)

## 2021-02-20 LAB — CBC AND DIFFERENTIAL
HCT: 41 (ref 36–46)
Hemoglobin: 14.4 (ref 12.0–16.0)
Platelets: 220 (ref 150–399)
WBC: 6.7

## 2021-02-20 LAB — COMPREHENSIVE METABOLIC PANEL: Albumin: 4.4 (ref 3.5–5.0)

## 2021-02-21 ENCOUNTER — Encounter: Payer: Self-pay | Admitting: Family Medicine

## 2021-02-21 ENCOUNTER — Telehealth (INDEPENDENT_AMBULATORY_CARE_PROVIDER_SITE_OTHER): Payer: Self-pay | Admitting: Family Medicine

## 2021-02-21 DIAGNOSIS — J22 Unspecified acute lower respiratory infection: Secondary | ICD-10-CM

## 2021-02-21 DIAGNOSIS — R059 Cough, unspecified: Secondary | ICD-10-CM

## 2021-02-21 MED ORDER — AMOXICILLIN-POT CLAVULANATE 875-125 MG PO TABS
1.0000 | ORAL_TABLET | Freq: Two times a day (BID) | ORAL | 0 refills | Status: AC
Start: 2021-02-21 — End: 2021-02-26

## 2021-02-21 NOTE — Progress Notes (Signed)
Virtual Video Visit via MyChart Note  I connected with  Para March on 02/21/21 at 10:10 AM EST by the video enabled telemedicine application for MyChart, and verified that I am speaking with the correct person using two identifiers.   I introduced myself as a Designer, jewellery with the practice. We discussed the limitations of evaluation and management by telemedicine and the availability of in person appointments. The patient expressed understanding and agreed to proceed.  Participating parties in this visit include: The patient and the nurse practitioner listed.  The patient is: At home I am: In the office - [facility]  Subjective:    CC:  Chief Complaint  Patient presents with  . Cough    HPI: Frances [name] is a 45 y.o. year old female presenting today via [place] today for worsening cough and congestion.  Patient states about 10 or 12 days ago she started feeling bad with a sore throat, nasal congestion, and malaise.  Within a few days the congestion started moving into her chest.  She is now reporting worsening ethmoid sinus pressure, clear rhinorrhea, headache with coughing, costochondritis due to coughing, and thick yellowish-greenish sputum from productive cough.  She does not think she has had any fevers/chills.  She denies dyspnea, chest pain, palpitations, GI symptoms.  No loss in taste or smell.  She has not tested for Covid yet, but does have a home test she is planning to take.  So far she has tried taking Mucinex D which has helped some with nasal congestion but other symptoms continue to worsen.   Past medical history, Surgical history, Family history not pertinant except as noted below, Social history, Allergies, and medications have been entered into the medical record, reviewed, and corrections made.   Review of Systems:  All review of systems negative except what is listed in the HPI   Objective:    General:  Speaking clearly in  complete sentences. Absent shortness of breath noted.   Alert and oriented x3.   Normal judgment.  Absent acute distress. Productive cough heard during video visit.  Impression and Recommendations:    1. Cough 2. Lower respiratory infection  Patient likely started with viral upper respiratory infection a week or 2 ago, but is now potentially developing into a secondary infection given the increased sinus pressure and coughing with purulent sputum.  We will go ahead and start a short course of Augmentin.  No acute distress or shortness of breath described, can hold off on steroids for now.  Continue with supportive therapy including Mucinex, Flonase, Tylenol or Advil, humidifier use, rest and hydration.  Can use over-the-counter cough syrup as needed-patient did not need anything stronger at this time.  Educated on signs and symptoms that would require urgent evaluation.  - amoxicillin-clavulanate (AUGMENTIN) 875-125 MG tablet; Take 1 tablet by mouth 2 (two) times daily for 5 days.  Dispense: 10 tablet; Refill: 0    Follow-up if symptoms worsen or fail to improve.    I discussed the assessment and treatment plan with the patient. The patient was provided an opportunity to ask questions and all were answered. The patient agreed with the plan and demonstrated an understanding of the instructions.   The patient was advised to call back or seek an in-person evaluation if the symptoms worsen or if the condition fails to improve as anticipated.  I provided 20 minutes of non-face-to-face interaction with this [place] visit including intake, same-day documentation, and chart review.  Terrilyn Saver, NP

## 2021-02-21 NOTE — Patient Instructions (Signed)
It sounds like you may be developing a secondary infection after a cold or virus.  I will attach some over-the-counter medicines here that may help with your symptoms.  I am sending in a short course of antibiotics.  Please let us know if you are not feeling better within the next few days or if symptoms begin to worsen.  Over the counter medications that may be helpful for symptoms:  . Guaifenesin 1200 mg extended release tabs twice daily, with plenty of water o For cough and congestion o Brand name: Mucinex   . Pseudoephedrine 30 mg, one or two tabs every 4 to 6 hours o For sinus congestion o Brand name: Sudafed o You must get this from the pharmacy counter.  . Oxymetazoline nasal spray each morning, one spray in each nostril, for NO MORE THAN 3 days  o For nasal and sinus congestion o Brand name: Afrin . Saline nasal spray or Saline Nasal Irrigation 3-5 times a day o For nasal and sinus congestion o Brand names: [place] or AYR . Fluticasone nasal spray, one spray in each nostril, each morning (after oxymetazoline and saline, if used) o For nasal and sinus congestion o Brand name: Flonase . Warm salt water gargles  o For sore throat o Every few hours as needed . Alternate ibuprofen 400-600 mg and acetaminophen 1000 mg every 4-6 hours o For fever, body aches, headache o Brand names: Motrin or Advil and Tylenol . Dextromethorphan 12-hour cough version 30 mg every 12 hours  o For cough o Brand name: Delsym Stop all other cold medications for now (Nyquil, Dayquil, Tylenol Cold, Theraflu, etc) and other non-prescription cough/cold preparations. Many of these have the same ingredients listed above and could cause an overdose of medication.   Herbal treatments that have been shown to be helpful in some patients include: Vitamin C 1000mg  per day Vitamin D 4000iU per day Zinc 100mg  per day Quercetin 25-500mg  twice a day Melatonin 5-10mg  at bedtime  General Instructions . Allow your body  to rest . Drink PLENTY of fluids . Isolate yourself from everyone, even family, until test results have returned  If your COVID-19 test is positive . Then you ARE INFECTED and you can pass the virus to others . You must quarantine from others for a minimum of  o 10 days since symptoms started AND o You are fever free for 24 hours WITHOUT any medication to reduce fever AND o Your symptoms are improving . Do not go to the store or other public areas . Do not go around household members who are not known to be infected with COVID-19 . If you MUST leave your area of quarantine (example: go to a bathroom you share with others in your home), you must o Wear a mask o Wash your hands thoroughly o Wipe down any surfaces you touch . Do not share food, drinks, towels, or other items with other persons . Dispose of your own tissues, food containers, etc  Once you have recovered, please continue good preventive care measures, including:  . wearing a mask when in public . wash your hands frequently . avoid touching your face/nose/eyes . cover coughs/sneezes with the inside of your elbow . stay out of crowds . keep a 6 foot distance from others  If you develop severe shortness of breath, uncontrolled fevers, coughing up blood, confusion, chest pain, or signs of dehydration (such as significantly decreased urine amounts or dizziness with standing) please go to the ER.

## 2021-02-25 ENCOUNTER — Ambulatory Visit (INDEPENDENT_AMBULATORY_CARE_PROVIDER_SITE_OTHER): Payer: Self-pay | Admitting: Family Medicine

## 2021-02-25 ENCOUNTER — Encounter: Payer: Self-pay | Admitting: Family Medicine

## 2021-02-25 ENCOUNTER — Ambulatory Visit (INDEPENDENT_AMBULATORY_CARE_PROVIDER_SITE_OTHER): Payer: Self-pay

## 2021-02-25 ENCOUNTER — Other Ambulatory Visit: Payer: Self-pay

## 2021-02-25 DIAGNOSIS — R059 Cough, unspecified: Secondary | ICD-10-CM

## 2021-02-25 DIAGNOSIS — J22 Unspecified acute lower respiratory infection: Secondary | ICD-10-CM

## 2021-02-25 MED ORDER — PREDNISONE 20 MG PO TABS
40.0000 mg | ORAL_TABLET | Freq: Every day | ORAL | 0 refills | Status: DC
Start: 2021-02-25 — End: 2021-03-13

## 2021-02-25 MED ORDER — AZITHROMYCIN 250 MG PO TABS: ORAL_TABLET | ORAL | 0 refills | Status: DC

## 2021-02-25 NOTE — Progress Notes (Signed)
Acute Office Visit  Subjective:    Patient ID: Frances [name], female    DOB: 28-Dec-1975, 45 y.o.   MRN: [id number]  No chief complaint on file.   HPI Patient is in today for ongoing cough and congestion.  She was seen virtually last week after almost 2 weeks of URI symptoms concerning for secondary infection - she was started on Augmentin at that time. Today is the last day of her antibiotic and she really has not noticed much difference. She reports some general malaise, flushing/warm (but has not been checking her temperature at home - states her baseline temp is lower than the average normal and she feels like 87 F is high for her), nasal/sinus congestion, and chest congestion. She continues to have a productive cough with thick, yellow-green sputum, dyspnea on exertion, occasional wheeze. Reports she has had a negative COVID test since last visit.   She denies chest pain, dyspnea at rest, pain with inspiration, palpitations, GI symptoms, loss of taste/smell.     Past Medical History:  Diagnosis Date  . Anxiety   . Arthritis   . Back pain, chronic   . Bipolar 1 disorder ([place])   . Chronic fatigue syndrome   . DDD (degenerative disc disease), lumbar   . Depression   . Fibromyalgia   . IUD 2012   Mirena  . MTHFR gene mutation     Past Surgical History:  Procedure Laterality Date  . COLONOSCOPY WITH PROPOFOL N/A 02/25/2017   Procedure: COLONOSCOPY WITH PROPOFOL;  Surgeon: Arta Silence, MD;  Location: [facility];  Service: Endoscopy;  Laterality: N/A;  . left knee lateral release  1993  . scar tisue removal  03/2005   left ankle  . TONSILLECTOMY  age 6's    Family History  Problem Relation Age of Onset  . Diabetes Mother   . Stroke Mother   . Lupus Mother   . Hypertension Mother   . Congestive Heart Failure Mother   . Mental illness Brother        Not clear what his diagnosis is--may be related to previous drug use.  . Cancer Maternal Grandmother        colon   . Depression Maternal Uncle   . Alcohol abuse Maternal Uncle   . Diabetes Maternal Grandfather     Social History   Socioeconomic History  . Marital status: Single    Spouse name: Not on file  . Number of children: 0  . Years of education: Not on file  . Highest education level: Bachelor's degree (e.g., BA, AB, BS)  Occupational History  . Occupation: Designer, industrial/product at times  Tobacco Use  . Smoking status: Never Smoker  . Smokeless tobacco: Never Used  Vaping Use  . Vaping Use: Never used  Substance and Sexual Activity  . Alcohol use: No    Alcohol/week: 0.0 standard drinks  . Drug use: No  . Sexual activity: Yes    Birth control/protection: I.U.D.  Other Topics Concern  . Not on file  Social History Narrative   Originally from [place], outside of [place].    Moved here permanently 2012.   Intermittently worked on a farm.   Lives with many cats--3 plus fosters cats.    Single, lives alone in a one story home. Rarely drinks caffeine. 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   Social Determinants of Health   Financial Resource Strain: Medium Risk  . Difficulty of Paying Living Expenses: Somewhat hard  Food Insecurity: No Food Insecurity  . Worried About Charity fundraiser in the Last Year: Never true  . Ran Out of Food in the Last Year: Never true  Transportation Needs: No Transportation Needs  . Lack of Transportation (Medical): No  . Lack of Transportation (Non-Medical): No  Physical Activity: Inactive  . Days of Exercise per Week: 0 days  . Minutes of Exercise per Session: 0 min  Stress: No Stress Concern Present  . Feeling of Stress : Not at all  Social Connections: Moderately Isolated  . Frequency of Communication with Friends and Family: More than three times a week  . Frequency of Social Gatherings with Friends and Family: More than three times a week  . Attends Religious Services: Never  . Active Member of Clubs or Organizations:  Yes  . Attends Archivist Meetings: More than 4 times per year  . Marital Status: Never married  Intimate Partner Violence: Not At Risk  . Fear of Current or Ex-Partner: No  . Emotionally Abused: No  . Physically Abused: No  . Sexually Abused: No    Outpatient Medications Prior to Visit  Medication Sig Dispense Refill  . amoxicillin-clavulanate (AUGMENTIN) 875-125 MG tablet Take 1 tablet by mouth 2 (two) times daily for 5 days. 10 tablet 0  . ARIPiprazole (ABILIFY) 15 MG tablet Take 1 tablet (15 mg total) by mouth daily. 30 tablet 2  . Azelastine HCl 137 MCG/SPRAY SOLN Place 2 applicators into alternate nostrils 2 (two) times daily. 30 mL 2  . benztropine (COGENTIN) 0.5 MG tablet Take 1 tablet (0.5 mg total) by mouth daily. 30 tablet 2  . buPROPion (WELLBUTRIN XL) 300 MG 24 hr tablet Take 1 tablet (300 mg total) by mouth daily. 30 tablet 2  . cholecalciferol (VITAMIN D3) 25 MCG (1000 UT) tablet Take 1,000 Units by mouth daily.    . diclofenac (VOLTAREN) 75 MG EC tablet TAKE 1 TABLET BY MOUTH TWICE A DAY 60 tablet 3  . DULoxetine (CYMBALTA) 60 MG capsule Take 60 mg by mouth daily.    . famotidine (PEPCID) 20 MG tablet Take 20 mg by mouth daily.    . fluticasone (FLONASE) 50 MCG/ACT nasal spray USE 2 SPRAYS IN EACH NOSTRIL EVERY DAY 16 g 1  . gabapentin (NEURONTIN) 800 MG tablet Take 1 tablet (800 mg total) by mouth 3 (three) times daily. 90 tablet 3  . hydrochlorothiazide (HYDRODIURIL) 12.5 MG tablet Take 1 tablet (12.5 mg total) by mouth daily. 30 tablet 2  . HYDROcodone-acetaminophen (NORCO/VICODIN) 5-325 MG tablet Take 1 tablet by mouth 2 (two) times daily as needed for moderate pain (back pain). 60 tablet 0  . levocetirizine (XYZAL) 5 MG tablet Take 1 tablet (5 mg total) by mouth every evening. 90 tablet 0  . methocarbamol (ROBAXIN) 500 MG tablet Take 1 tablet (500 mg total) by mouth 3 (three) times daily. 90 tablet 1  . montelukast (SINGULAIR) 10 MG tablet TAKE 1 Tablet BY  MOUTH ONCE EVERY NIGHT AT BEDTIME 90 tablet 1  . pantoprazole (PROTONIX) 40 MG tablet TAKE 1 TABLET BY MOUTH DAILY 90 tablet 1  . polyethylene glycol (MIRALAX / GLYCOLAX) 17 g packet Take 17 g by mouth daily.    [name] topiramate (TOPAMAX) 25 MG tablet Take one tablet at bedtime. 30 tablet 1  . traZODone (DESYREL) 50 MG tablet Take 1 tablet (50 mg total) by mouth at bedtime as needed for sleep. 60 tablet 2   No facility-administered medications prior to  visit.    Allergies  Allergen Reactions  . Metaxalone Hives and Other (See Comments)    Review of Systems All review of systems negative except what is listed in the HPI     Objective:    Physical Exam Vitals reviewed.  Constitutional:      Appearance: Normal appearance.  HENT:     Right Ear: Tympanic membrane normal.     Left Ear: Tympanic membrane normal.     Nose: Congestion present.     Mouth/Throat:     Mouth: Mucous membranes are moist.  Cardiovascular:     Rate and Rhythm: Regular rhythm. Tachycardia present.     Heart sounds: Normal heart sounds.  Pulmonary:     Effort: Pulmonary effort is normal. No respiratory distress.     Breath sounds: No wheezing.     Comments: Slightly diminished bases bilaterally Chest:     Chest wall: No tenderness.  Skin:    General: Skin is warm.  Neurological:     General: No focal deficit present.     Mental Status: She is alert and oriented to person, place, and time.  Psychiatric:        Mood and Affect: Mood normal.        Behavior: Behavior normal.        Thought Content: Thought content normal.        Judgment: Judgment normal.     There were no vitals taken for this visit. Wt Readings from Last 3 Encounters:  12/17/20 205 lb (93 kg)  11/19/20 206 lb (93.4 kg)  10/22/20 204 lb 1.9 oz (92.6 kg)    Health Maintenance Due  Topic Date Due  . Hepatitis C Screening  Never done  . HIV Screening  Never done  . PAP SMEAR-Modifier  04/08/2021    There are no preventive care  reminders to display for this patient.   Lab Results  Component Value Date   TSH 0.866 11/21/2020   Lab Results  Component Value Date   WBC 5.8 11/21/2020   HGB 14.3 11/21/2020   HCT 40.7 11/21/2020   MCV 93 11/21/2020   PLT 272 11/21/2020   Lab Results  Component Value Date   NA 142 11/21/2020   K 4.9 11/21/2020   CO2 25 11/21/2020   GLUCOSE 130 (H) 11/21/2020   BUN 10 11/21/2020   CREATININE 0.89 11/21/2020   BILITOT 0.6 11/21/2020   ALKPHOS 97 11/21/2020   AST 44 (H) 11/21/2020   ALT 51 (H) 11/21/2020   PROT 6.8 11/21/2020   ALBUMIN 4.4 11/21/2020   CALCIUM 9.9 11/21/2020   ANIONGAP 7 01/24/2018   Lab Results  Component Value Date   CHOL 130 04/14/2018   Lab Results  Component Value Date   HDL 28 (L) 04/14/2018   Lab Results  Component Value Date   LDLCALC 73 04/14/2018   Lab Results  Component Value Date   TRIG 192 (H) 04/14/2018   Lab Results  Component Value Date   CHOLHDL 4.6 04/14/2018   Lab Results  Component Value Date   HGBA1C 5.2 04/14/2018       Assessment & Plan:   1. Cough 2. Lower respiratory infection  Patient with little to no improvement following 5 days of Augmentin. Given her ongoing symptoms, purulent sputum, low grade temps, tachycardia - will go ahead and switch to a Zpak, give a prednisone burst, and check a chest x-ray for concern of PNA. Patient agreeable to plan with no other  concerns today. No acute distress today. Educated on signs and symptoms requiring urgent evaluation.  - predniSONE (DELTASONE) 20 MG tablet; Take 2 tablets (40 mg total) by mouth daily with breakfast. Two tabs PO daily for 5 days.  Dispense: 10 tablet; Refill: 0 - DG Chest 2 View; Future - azithromycin (ZITHROMAX Z-PAK) 250 MG tablet; Take 2 tablets (500 mg) on  Day 1,  followed by 1 tablet (250 mg) once daily on Days 2 through 5.  Dispense: 6 tablet; Refill: 0   Will update patient on results. Follow-up if symptoms worsen or fail to  improve.  Terrilyn Saver, NP

## 2021-02-25 NOTE — Patient Instructions (Signed)
Give your ongoing symptoms, increased heart rate and low grade temp, let's get an x-ray today to check for pneumonia as well as start some prednisone and a z-pak. Please let us know if your symptoms worsen or fail to improve.

## 2021-03-06 ENCOUNTER — Ambulatory Visit (INDEPENDENT_AMBULATORY_CARE_PROVIDER_SITE_OTHER): Payer: No Typology Code available for payment source | Admitting: Specialist

## 2021-03-06 ENCOUNTER — Ambulatory Visit (HOSPITAL_COMMUNITY): Payer: No Payment, Other | Admitting: Licensed Clinical Social Worker

## 2021-03-06 ENCOUNTER — Ambulatory Visit: Payer: Self-pay

## 2021-03-06 ENCOUNTER — Other Ambulatory Visit: Payer: Self-pay

## 2021-03-06 ENCOUNTER — Encounter: Payer: Self-pay | Admitting: Physician Assistant

## 2021-03-06 ENCOUNTER — Encounter: Payer: Self-pay | Admitting: Specialist

## 2021-03-06 VITALS — BP 128/92 | HR 101 | Ht 67.0 in | Wt 204.0 lb

## 2021-03-06 DIAGNOSIS — Z5321 Procedure and treatment not carried out due to patient leaving prior to being seen by health care provider: Secondary | ICD-10-CM

## 2021-03-06 DIAGNOSIS — F319 Bipolar disorder, unspecified: Secondary | ICD-10-CM

## 2021-03-06 DIAGNOSIS — M5136 Other intervertebral disc degeneration, lumbar region: Secondary | ICD-10-CM

## 2021-03-06 NOTE — Progress Notes (Signed)
Patient left without being seen.  She had an appointment at 1pm and left at 12:15pm

## 2021-03-06 NOTE — Progress Notes (Signed)
   THERAPIST PROGRESS NOTE  Session Time: 65  Participation Level: Active  Behavioral Response: NAAlertflat   Type of Therapy: Individual Therapy  Treatment Goals addressed: Diagnosis: Bipolar depression   Interventions: CBT and Supportive  Summary: Frances [name] is a 45 y.o. female who presents with bipolar depression.   Suicidal/Homicidal: NAwithout intent/plan Virtual Visit via Video Note  I connected with Frances [name] on 03/06/21 at  1:00 PM EDT by a video enabled telemedicine application and verified that I am speaking with the correct person using two identifiers.  Location: Patient: Frances [name]  Provider: [name]    I discussed the limitations of evaluation and management by telemedicine and the availability of in person appointments. The patient expressed understanding and agreed to proceed.   Therapist Response:    Subjective/Objective:  Pt was alert and oriented x 5. She was not observed as session was conducted via phone due to technical difficulties. Pt Presented with flat mood but was cooperative and maintained good eye contact.   Pt primary stressor was illness and pet care. She owns a horse that has been sick for the past several months. Frances [name] reports that they are in the maintenance stage of the illness now and the horse will only get better with special equipment. Frances [name] is trying to figure out how much care is too much for the horse before she decides the euthanize the horse.   Frances [name] also has been stressed due to her back pain. Pt has been struggling with her chronic back pain for the past few months. She states that she was supposed to see a specialist today but was waiting for over 1.5 hours in the provider office before leaving. Frances [name] was told that provider is thorough with his pt, but Frances [name] stated she was not going to wait more than an hour in a half for a scheduled appt.    Assessment/Plan: Plan moving forward will be to walk 3 x  weekly. Past 3 weeks pt has walk limited amount maybe 1 x per week due to illness. She states that she spends only about 1 x per week with her horse other than feeding it 2 x daily. Pt objective for horse will be to spend 2 to 3 times weekly with him. Frances [name] has new objective to journal 1 x weekly 4 to 6 sentences on the trauma of her father leaving. Pt endorses symptoms for sadness, hopelessness, irritability, and fatigue along with rapid thoughts, insomnia, and mood swings. She does meet criteria for bipolar 1 disorder     I discussed the assessment and treatment plan with the patient. The patient was provided an opportunity to ask questions and all were answered. The patient agreed with the plan and demonstrated an understanding of the instructions.   The patient was advised to call back or seek an in-person evaluation if the symptoms worsen or if the condition fails to improve as anticipated.  I provided 45 minutes of non-face-to-face time during this encounter.   [name], LCSW   Plan: Return again in 3 weeks.      [name], LCSW 03/06/2021

## 2021-03-08 ENCOUNTER — Ambulatory Visit: Admit: 2021-03-08 | Discharge: 2021-03-09

## 2021-03-08 DIAGNOSIS — M0600 Rheumatoid arthritis without rheumatoid factor, unspecified site: Principal | ICD-10-CM

## 2021-03-08 DIAGNOSIS — M797 Fibromyalgia: Principal | ICD-10-CM

## 2021-03-08 DIAGNOSIS — R739 Hyperglycemia, unspecified: Principal | ICD-10-CM

## 2021-03-08 DIAGNOSIS — M1990 Unspecified osteoarthritis, unspecified site: Principal | ICD-10-CM

## 2021-03-08 LAB — CBC AND DIFFERENTIAL
HCT: 40 (ref 36–46)
Hemoglobin: 13.9 (ref 12.0–16.0)
Platelets: 252 (ref 150–399)
WBC: 6.6

## 2021-03-08 LAB — HEPATIC FUNCTION PANEL
ALT: 58 — AB (ref 7–35)
AST: 37 — AB (ref 13–35)
Alkaline Phosphatase: 120 (ref 25–125)
Bilirubin, Total: 0.6

## 2021-03-08 LAB — BASIC METABOLIC PANEL
BUN: 12 (ref 4–21)
CO2: 26 — AB (ref 13–22)
Chloride: 103 (ref 99–108)
Creatinine: 0.7 (ref 0.5–1.1)
Glucose: 284
Potassium: 4.2 (ref 3.4–5.3)
Sodium: 135 — AB (ref 137–147)

## 2021-03-08 LAB — HEMOGLOBIN A1C: Hemoglobin A1C: 7

## 2021-03-08 LAB — COMPREHENSIVE METABOLIC PANEL
Albumin: 3.6 (ref 3.5–5.0)
Calcium: 9.8 (ref 8.7–10.7)

## 2021-03-08 LAB — CBC: RBC: 4.42 (ref 3.87–5.11)

## 2021-03-08 MED ORDER — HYDROXYCHLOROQUINE 200 MG TABLET
ORAL_TABLET | Freq: Two times a day (BID) | ORAL | 6 refills | 30 days | Status: CP
Start: 2021-03-08 — End: 2022-03-08
  Filled 2021-03-15: qty 60, 30d supply, fill #0

## 2021-03-08 MED ORDER — MELOXICAM 15 MG TABLET
ORAL_TABLET | Freq: Every day | ORAL | 0 refills | 60 days | Status: CP
Start: 2021-03-08 — End: 2022-03-08

## 2021-03-08 MED ORDER — DULOXETINE 30 MG CAPSULE,DELAYED RELEASE
ORAL_CAPSULE | Freq: Every day | ORAL | 5 refills | 30 days | Status: CP
Start: 2021-03-08 — End: 2021-06-06
  Filled 2021-03-15: qty 60, 30d supply, fill #0

## 2021-03-11 NOTE — Telephone Encounter (Signed)
I sent referral to [facility] waiting to see what they say or if they do the ADHD testing there. - CF

## 2021-03-12 ENCOUNTER — Encounter: Payer: Self-pay | Admitting: Neurology

## 2021-03-13 ENCOUNTER — Other Ambulatory Visit: Payer: Self-pay | Admitting: Physician Assistant

## 2021-03-13 ENCOUNTER — Ambulatory Visit (INDEPENDENT_AMBULATORY_CARE_PROVIDER_SITE_OTHER): Payer: Self-pay | Admitting: Physician Assistant

## 2021-03-13 ENCOUNTER — Other Ambulatory Visit: Payer: Self-pay

## 2021-03-13 VITALS — BP 137/76 | HR 110 | Ht 67.0 in | Wt 208.0 lb

## 2021-03-13 DIAGNOSIS — R0989 Other specified symptoms and signs involving the circulatory and respiratory systems: Secondary | ICD-10-CM

## 2021-03-13 DIAGNOSIS — I10 Essential (primary) hypertension: Secondary | ICD-10-CM

## 2021-03-13 DIAGNOSIS — E6609 Other obesity due to excess calories: Secondary | ICD-10-CM

## 2021-03-13 DIAGNOSIS — Z6832 Body mass index (BMI) 32.0-32.9, adult: Secondary | ICD-10-CM

## 2021-03-13 DIAGNOSIS — E1165 Type 2 diabetes mellitus with hyperglycemia: Secondary | ICD-10-CM

## 2021-03-13 DIAGNOSIS — J3489 Other specified disorders of nose and nasal sinuses: Secondary | ICD-10-CM

## 2021-03-13 DIAGNOSIS — E039 Hypothyroidism, unspecified: Secondary | ICD-10-CM

## 2021-03-13 DIAGNOSIS — J302 Other seasonal allergic rhinitis: Secondary | ICD-10-CM

## 2021-03-13 DIAGNOSIS — E785 Hyperlipidemia, unspecified: Secondary | ICD-10-CM

## 2021-03-13 DIAGNOSIS — E119 Type 2 diabetes mellitus without complications: Secondary | ICD-10-CM | POA: Insufficient documentation

## 2021-03-13 MED ORDER — METFORMIN HCL 1000 MG PO TABS: 1000.0000 mg | ORAL_TABLET | Freq: Two times a day (BID) | ORAL | 1 refills | Status: DC

## 2021-03-13 MED ORDER — ATORVASTATIN CALCIUM 10 MG PO TABS: 10.0000 mg | ORAL_TABLET | Freq: Every day | ORAL | 1 refills | Status: DC

## 2021-03-13 MED ORDER — LISINOPRIL 5 MG PO TABS: 5.0000 mg | ORAL_TABLET | Freq: Every day | ORAL | 1 refills | Status: DC

## 2021-03-13 NOTE — Progress Notes (Signed)
Subjective:    Patient ID: Frances [name], female    DOB: December 03, 1976, 45 y.o.   MRN: [id number]  HPI  Pt is a 45 yo obese female with fibromyalgia, chronic pain, RA, hypothyroidism, bipolar 1 who presents to the clinic to discuss A!C of 7 at rheumatology check.   She has been gaining weight over the last 2 years after adding Abilify to regimen. Her mood is much better but weight is not. Family hx of diabetes but no personal history. Denies any hypoglycemic symptoms. Denies any increased urination or increased thirst.  No open sores or wounds.  Currently not on any medication for diabetes.  Denies any vision changes.  .. Active Ambulatory Problems    Diagnosis Date Noted  . Hypothyroidism 02/10/2008  . 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 DERMATITIS, ALLERGIC 06/05/2008  . DDD (degenerative disc disease), lumbar 02/10/2008  . Back pain 10/13/2008  . FIBROMYALGIA 02/10/2008  . FATIGUE 05/05/2008  . IRON, SERUM, ELEVATED 02/10/2008  . DYSPNEA 10/15/2011  . Bipolar I disorder ([place]) 09/16/2017  . Fibromyalgia 09/16/2017  . Chronic fatigue syndrome 09/16/2017  . MTHFR gene mutation 09/20/2017  . TMJ (temporomandibular joint syndrome) 09/26/2017  . New daily persistent headache 09/26/2017  . Rheumatoid arthritis 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 ([place]) 12/09/2017  . Anxiety 01/22/2018  . Borderline personality disorder ([place]) 01/22/2018  . Depression   . Elevated fasting glucose 04/08/2018  . Decreased visual acuity 08/30/2018  . Right corneal abrasion 08/30/2018  . Toenail fungus 09/09/2019  . Vasovagal response 09/09/2019  . Grief reaction 11/28/2019  . Labral tear of hip, degenerative 01/11/2020  . DDD (degenerative disc disease), cervical 03/13/2020  . Ingrowing left great toenail 03/13/2020  . Recurrent major depressive disorder, in partial remission ([place]) 05/24/2020  . Generalized anxiety disorder 05/24/2020  . Macromastia 06/29/2020  . Xiphoid pain  08/13/2020  . Sternum pain 08/13/2020  . Class 1 obesity due to excess calories without serious comorbidity with body mass index (BMI) of 32.0 to 32.9 in adult 08/13/2020  . Symptomatic mammary hypertrophy 09/04/2020  . Costochondritis 10/22/2020  . Rib pain on right side 10/22/2020  . Tachycardia 10/22/2020  . Hepatomegaly 10/22/2020  . Bilateral leg edema 11/19/2020  . Chest congestion 11/20/2020  . Sinus drainage 11/20/2020  . Seasonal allergies 12/25/2020  . Type 2 diabetes mellitus with hyperglycemia, without long-term current use of insulin ([place]) 03/13/2021  . Hyperlipidemia LDL goal <70 03/18/2021   Resolved Ambulatory Problems    Diagnosis Date Noted  . FEVER, RECURRENT 04/02/2009  . SINUSITIS- ACUTE-NOS 03/24/2008  . URI 01/17/2009  . ANKLE PAIN, BILATERAL 07/28/2008  . OPEN WOUND FT NO TOE ALONE WITHOUT MENTION COMP 03/28/2009  . Bipolar 1 disorder, depressed, severe ([place]) 01/25/2018  . Chronic right-sided low back pain without sciatica 01/11/2020   Past Medical History:  Diagnosis Date  . Arthritis   . Back pain, chronic   . Bipolar 1 disorder ([place])   . IUD 2012       Review of Systems  All other systems reviewed and are negative.      Objective:   Physical Exam Vitals reviewed.  Constitutional:      Appearance: Normal appearance. She is obese.  Cardiovascular:     Rate and Rhythm: Normal rate and regular rhythm.     Pulses: Normal pulses.  Pulmonary:     Effort: Pulmonary effort is normal.  Musculoskeletal:     Right lower leg: No edema.  Left lower leg: No edema.  Neurological:     General: No focal deficit present.     Mental Status: She is alert and oriented to person, place, and time.  Psychiatric:        Mood and Affect: Mood normal.           Assessment & Plan:  Marland KitchenMarland KitchenArdena was seen today for diabetes.  Diagnoses and all orders for this visit:  Type 2 diabetes mellitus with hyperglycemia, without long-term current use of  insulin (HCC) -     metFORMIN (GLUCOPHAGE) 1000 MG tablet; Take 1 tablet (1,000 mg total) by mouth 2 (two) times daily with a meal. -     lisinopril (ZESTRIL) 5 MG tablet; Take 1 tablet (5 mg total) by mouth daily.  Hyperlipidemia LDL goal <70 -     atorvastatin (LIPITOR) 10 MG tablet; Take 1 tablet (10 mg total) by mouth daily.  Class 1 obesity due to excess calories without serious comorbidity with body mass index (BMI) of 32.0 to 32.9 in adult  Hypertension goal BP (blood pressure) < 130/80 -     lisinopril (ZESTRIL) 5 MG tablet; Take 1 tablet (5 mg total) by mouth daily.   A1C from rheumatology 7.0.  Not to goal.  Started metformin. Discussed side effects.  Would like to start ozempic. Filled out patient assistance forms and worked with pharmacist on site to help get approved.  Discussed titration and side effects.  Discussed DM diet and regular 150 minutes of exercise.  Do not want to change Abilify at this time due to great mood control.  Added lipitor to get LDL to under 70 goal.  Added lisinopril for kidney protection and bP goal of under 130/80.  Discussed annual eye exam and foot exams.  covid vaccine UTD.  Flu vaccine UTD.  Needs pneumonia. Left before got vaccine.  Follow up in 3 months.   Spent 30 minutes with new diagnosis and treatment plan.

## 2021-03-13 NOTE — Patient Instructions (Signed)
Diabetes Mellitus and Nutrition, Adult When you have diabetes, or diabetes mellitus, it is very important to have healthy eating habits because your blood sugar (glucose) levels are greatly affected by what you eat and drink. Eating healthy foods in the right amounts, at about the same times every day, can help you:  Control your blood glucose.  Lower your risk of heart disease.  Improve your blood pressure.  Reach or maintain a healthy weight. What can affect my meal plan? Every person with diabetes is different, and each person has different needs for a meal plan. Your health care provider may recommend that you work with a dietitian to make a meal plan that is best for you. Your meal plan may vary depending on factors such as:  The calories you need.  The medicines you take.  Your weight.  Your blood glucose, blood pressure, and cholesterol levels.  Your activity level.  Other health conditions you have, such as heart or kidney disease. How do carbohydrates affect me? Carbohydrates, also called carbs, affect your blood glucose level more than any other type of food. Eating carbs naturally raises the amount of glucose in your blood. Carb counting is a method for keeping track of how many carbs you eat. Counting carbs is important to keep your blood glucose at a healthy level, especially if you use insulin or take certain oral diabetes medicines. It is important to know how many carbs you can safely have in each meal. This is different for every person. Your dietitian can help you calculate how many carbs you should have at each meal and for each snack. How does alcohol affect me? Alcohol can cause a sudden decrease in blood glucose (hypoglycemia), especially if you use insulin or take certain oral diabetes medicines. Hypoglycemia can be a life-threatening condition. Symptoms of hypoglycemia, such as sleepiness, dizziness, and confusion, are similar to symptoms of having too much  alcohol.  Do not drink alcohol if: ? Your health care provider tells you not to drink. ? You are pregnant, may be pregnant, or are planning to become pregnant.  If you drink alcohol: ? Do not drink on an empty stomach. ? Limit how much you use to:  0-1 drink a day for women.  0-2 drinks a day for men. ? Be aware of how much alcohol is in your drink. In the [place] one drink equals one 12 oz bottle of beer (355 mL), one 5 oz glass of wine (148 mL), or one 1 oz glass of hard liquor (44 mL). ? Keep yourself hydrated with water, diet soda, or unsweetened iced tea.  Keep in mind that regular soda, juice, and other mixers may contain a lot of sugar and must be counted as carbs. What are tips for following this plan? Reading food labels  Start by checking the serving size on the "Nutrition Facts" label of packaged foods and drinks. The amount of calories, carbs, fats, and other nutrients listed on the label is based on one serving of the item. Many items contain more than one serving per package.  Check the total grams (g) of carbs in one serving. You can calculate the number of servings of carbs in one serving by dividing the total carbs by 15. For example, if a food has 30 g of total carbs per serving, it would be equal to 2 servings of carbs.  Check the number of grams (g) of saturated fats and trans fats in one serving. Choose foods that have   a low amount or none of these fats.  Check the number of milligrams (mg) of salt (sodium) in one serving. Most people should limit total sodium intake to less than 2,300 mg per day.  Always check the nutrition information of foods labeled as "low-fat" or "nonfat." These foods may be higher in added sugar or refined carbs and should be avoided.  Talk to your dietitian to identify your daily goals for nutrients listed on the label. Shopping  Avoid buying canned, pre-made, or processed foods. These foods tend to be high in fat, sodium, and added  sugar.  Shop around the outside edge of the grocery store. This is where you will most often find fresh fruits and vegetables, bulk grains, fresh meats, and fresh dairy. Cooking  Use low-heat cooking methods, such as baking, instead of high-heat cooking methods like deep frying.  Cook using healthy oils, such as olive, canola, or sunflower oil.  Avoid cooking with butter, cream, or high-fat meats. Meal planning  Eat meals and snacks regularly, preferably at the same times every day. Avoid going long periods of time without eating.  Eat foods that are high in fiber, such as fresh fruits, vegetables, beans, and whole grains. Talk with your dietitian about how many servings of carbs you can eat at each meal.  Eat 4-6 oz (112-168 g) of lean protein each day, such as lean meat, chicken, fish, eggs, or tofu. One ounce (oz) of lean protein is equal to: ? 1 oz (28 g) of meat, chicken, or fish. ? 1 egg. ?  cup (62 g) of tofu.  Eat some foods each day that contain healthy fats, such as avocado, nuts, seeds, and fish.   What foods should I eat? Fruits Berries. Apples. Oranges. Peaches. Apricots. Plums. Grapes. Mango. Papaya. Pomegranate. Kiwi. Cherries. Vegetables Lettuce. Spinach. Leafy greens, including kale, chard, collard greens, and mustard greens. Beets. Cauliflower. Cabbage. Broccoli. Carrots. Green beans. Tomatoes. Peppers. Onions. Cucumbers. Brussels sprouts. Grains Whole grains, such as whole-wheat or whole-grain bread, crackers, tortillas, cereal, and pasta. Unsweetened oatmeal. Quinoa. Brown or wild rice. Meats and other proteins Seafood. Poultry without skin. Lean cuts of poultry and beef. Tofu. Nuts. Seeds. Dairy Low-fat or fat-free dairy products such as milk, yogurt, and cheese. The items listed above may not be a complete list of foods and beverages you can eat. Contact a dietitian for more information. What foods should I avoid? Fruits Fruits canned with  syrup. Vegetables Canned vegetables. Frozen vegetables with butter or cream sauce. Grains Refined white flour and flour products such as bread, pasta, snack foods, and cereals. Avoid all processed foods. Meats and other proteins Fatty cuts of meat. Poultry with skin. Breaded or fried meats. Processed meat. Avoid saturated fats. Dairy Full-fat yogurt, cheese, or milk. Beverages Sweetened drinks, such as soda or iced tea. The items listed above may not be a complete list of foods and beverages you should avoid. Contact a dietitian for more information. Questions to ask a health care provider  Do I need to meet with a diabetes educator?  Do I need to meet with a dietitian?  What number can I call if I have questions?  When are the best times to check my blood glucose? Where to find more information:  American Diabetes Association: diabetes.org  Academy of Nutrition and Dietetics: www.eatright.org  National Institute of Diabetes and Digestive and Kidney Diseases: www.niddk.nih.gov  Association of Diabetes Care and Education Specialists: www.diabeteseducator.org Summary  It is important to have healthy eating   habits because your blood sugar (glucose) levels are greatly affected by what you eat and drink.  A healthy meal plan will help you control your blood glucose and maintain a healthy lifestyle.  Your health care provider may recommend that you work with a dietitian to make a meal plan that is best for you.  Keep in mind that carbohydrates (carbs) and alcohol have immediate effects on your blood glucose levels. It is important to count carbs and to use alcohol carefully. This information is not intended to replace advice given to you by your health care provider. Make sure you discuss any questions you have with your health care provider. Document Revised: 11/15/2019 Document Reviewed: 11/15/2019 Elsevier Patient Education  2021 Elsevier Inc.  

## 2021-03-14 ENCOUNTER — Telehealth: Payer: Self-pay

## 2021-03-14 NOTE — Telephone Encounter (Signed)
Forms faxed with our portion to [place] patient assistance at [phone] with confirmation received.

## 2021-03-14 NOTE — Telephone Encounter (Signed)
Pt dropped paperwork off for application for rx. Placed paperwork in message box.[email or web address]

## 2021-03-15 NOTE — Telephone Encounter (Signed)
Thank you :)

## 2021-03-18 ENCOUNTER — Encounter: Payer: Self-pay | Admitting: Physician Assistant

## 2021-03-18 DIAGNOSIS — E785 Hyperlipidemia, unspecified: Secondary | ICD-10-CM | POA: Insufficient documentation

## 2021-03-22 ENCOUNTER — Encounter: Payer: Self-pay | Admitting: Physician Assistant

## 2021-03-26 ENCOUNTER — Other Ambulatory Visit: Payer: Self-pay | Admitting: Physician Assistant

## 2021-03-27 ENCOUNTER — Ambulatory Visit (INDEPENDENT_AMBULATORY_CARE_PROVIDER_SITE_OTHER): Payer: No Payment, Other | Admitting: Licensed Clinical Social Worker

## 2021-03-27 ENCOUNTER — Telehealth: Payer: Self-pay | Admitting: Physician Assistant

## 2021-03-27 ENCOUNTER — Other Ambulatory Visit: Payer: Self-pay

## 2021-03-27 DIAGNOSIS — F411 Generalized anxiety disorder: Secondary | ICD-10-CM | POA: Diagnosis not present

## 2021-03-27 DIAGNOSIS — F3341 Major depressive disorder, recurrent, in partial remission: Secondary | ICD-10-CM

## 2021-03-27 DIAGNOSIS — E1165 Type 2 diabetes mellitus with hyperglycemia: Secondary | ICD-10-CM

## 2021-03-27 DIAGNOSIS — F319 Bipolar disorder, unspecified: Secondary | ICD-10-CM | POA: Diagnosis not present

## 2021-03-27 NOTE — Telephone Encounter (Signed)
Referral placed.

## 2021-03-27 NOTE — Progress Notes (Signed)
   THERAPIST PROGRESS NOTE   Virtual Visit via Telephone Note  I connected with Para March on 03/27/21 at  1:00 PM EDT by telephone and verified that I am speaking with the correct person using two identifiers.  Location: Patient: [facility]  Provider: [facility]    I discussed the limitations, risks, security and privacy concerns of performing an evaluation and management service by telephone and the availability of in person appointments. I also discussed with the patient that there may be a patient responsible charge related to this service. The patient expressed understanding and agreed to proceed.    Session Time: 79   Therapist Response:    Subjective/Objective:   Pt was alert and oriented x 5. She was not observed as pt was having technical difficulties and therapy needed to be conducted via phone. Pt presented with flat and depressed mood/affect. She was cooperative in session.    Primary stressor is illness and lack of motivation. Pt reports that 2 weeks ago she was Dx with diabetes. [name] states there is a Hx in her family of this Dx with her mother. Currently pt is only being managed by  oral medications and not injections. LCSW provided resources to [facility] nutrition and diabetic education. [name] will f/u with them on next steps.   [name] reports a lack of motivation. Goals/objectives for pt were to journal 1 x per week and walk 2 x per week. Pt has not been walking at all and she reports she has only journaled 2 x since last session. New goals/objectives will be to walk 1 x per week, and journal 1 x per week. [name] was agreeable to plan.    Assessment/Plan:  Pt endorses symptoms for tension, worry, fatigue, poor appetite, and irritability. She does meet criteria for GAD and bipolar 1 depression. She is taking her medications as prescribed. LCSW and pt will f/u in 2 weeks.    I discussed the assessment and treatment plan with the patient. The patient  was provided an opportunity to ask questions and all were answered. The patient agreed with the plan and demonstrated an understanding of the instructions.   The patient was advised to call back or seek an in-person evaluation if the symptoms worsen or if the condition fails to improve as anticipated.  I provided 45 minutes of non-face-to-face time during this encounter.   [name], LCSW   Participation Level: Active  Behavioral Response: CasualAlertAnxious and Depressed  Type of Therapy: Individual Therapy  Treatment Goals addressed: Diagnosis: Anxiety and depression   Interventions: CBT and Supportive  Summary: MARICA TRENTHAM is a 45 y.o. female who presents with    Suicidal/Homicidal: NAwithout intent/plan    Plan: Return again in 2 weeks.      [name], LCSW 03/27/2021

## 2021-03-27 NOTE — Telephone Encounter (Signed)
Referral Sent

## 2021-03-27 NOTE — Telephone Encounter (Signed)
Frances [name] called and said she was talking to [facility] and needs a referral to schedule with them. Please advise. - CF

## 2021-03-27 NOTE — Telephone Encounter (Signed)
Ok to make referral

## 2021-04-09 ENCOUNTER — Other Ambulatory Visit: Payer: Self-pay | Admitting: Physician Assistant

## 2021-04-10 ENCOUNTER — Ambulatory Visit (INDEPENDENT_AMBULATORY_CARE_PROVIDER_SITE_OTHER): Payer: No Payment, Other | Admitting: Licensed Clinical Social Worker

## 2021-04-10 ENCOUNTER — Other Ambulatory Visit: Payer: Self-pay

## 2021-04-10 DIAGNOSIS — F319 Bipolar disorder, unspecified: Secondary | ICD-10-CM | POA: Diagnosis not present

## 2021-04-10 NOTE — Progress Notes (Signed)
   THERAPIST PROGRESS NOTE  Session Time: 4  Participation Level: Active  Behavioral Response: NAAlertAnxious and Depressed  Type of Therapy: Individual Therapy  Treatment Goals addressed: Diagnosis: bipolar disorder   Interventions: CBT and Supportive  Summary: Frances [name] is a 45 y.o. female who presents with bipolar depression.   Suicidal/Homicidal: Yeswithout intent/plan  Virtual Visit via Telephone Note  I connected with Frances [name] on 04/10/21 at  2:00 PM EDT by telephone and verified that I am speaking with the correct person using two identifiers.  Location: Patient: [facility]  Provider: [facility]    I discussed the limitations, risks, security and privacy concerns of performing an evaluation and management service by telephone and the availability of in person appointments. I also discussed with the patient that there may be a patient responsible charge related to this service. The patient expressed understanding and agreed to proceed.  Therapist Response:   Subjective/Objective:  Pt was alert and oriented x 5. She was not observed as pt had technical difficulties with text links. She presented with depressed and anxious mood/affect. Pt was cooperative and maintained good eye contact.   Pt was resistant in today's session. She started out by speaking on her father who abandoned her when she was 31 years old. She states she is not able to move past the hurt she has for him. LCSW asked pt if she would write a letter to him and burn it to attempt closure on the matter, she states she did not think that would work. Pt also states that she has increased depression and reports suicidal thoughts without intent or plan. LCSW did contract pt for safety and pt was agreeable to come to emergency room if suicidal thoughts had intent or a plan behind them. Frances [name] was verbally agreeable to plan.  LCSW shifted direction of session to decreasing suicidal  thoughts. She states keeping busy. 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 LCSW recommended [employer] and dog shelter. Pt states she did not like pet shelters practices and did not believe [facility] had volunteer opportunities. LCSW looked up [facility] in [place] and they do have volunteer activities. Pt asked to call to explore those activities. Frances [name] was resistant to the idea but agreeable. Pt will also continue to Journal 1 x weekly and walk 1 x weekly. Goal is to increase both those goals to 2 to 3 times per week.     Assessment/Plan: Pt endorses symptoms for depression for worthlessness, hopelessness, fatigue, irritability, restlessness, and sleeping too much. She does meet criteria for bipolar depression. She is taking medications as prescribed and does not want to change any of them currently. Pt will f/u with LCSW in 3 weeks.    I discussed the assessment and treatment plan with the patient. The patient was provided an opportunity to ask questions and all were answered. The patient agreed with the plan and demonstrated an understanding of the instructions.   The patient was advised to call back or seek an in-person evaluation if the symptoms worsen or if the condition fails to improve as anticipated.  I provided 45 minutes of non-face-to-face time during this encounter.   [name], LCSW   Plan: Return again in 4 weeks.    [name], LCSW 04/10/2021

## 2021-04-11 ENCOUNTER — Encounter: Payer: Self-pay | Admitting: Physician Assistant

## 2021-04-11 NOTE — Telephone Encounter (Signed)
For some people it can cause stomach upset or loose stools but not everyone. She can always stop med if starts to cause stomach upset.

## 2021-04-16 NOTE — Telephone Encounter (Signed)
Luvenia Starch    this site does not do testing either I am at a loss on where to send her. It seems no one in [place] does the testing any longer I have researched and it said there was a doctor that did testing but he is a physician from [place] so why he showed up for [facility] I don't know. Please advise  Jenny Reichmann

## 2021-04-17 ENCOUNTER — Other Ambulatory Visit: Payer: Self-pay | Admitting: Physician Assistant

## 2021-04-17 DIAGNOSIS — R0989 Other specified symptoms and signs involving the circulatory and respiratory systems: Secondary | ICD-10-CM

## 2021-04-17 DIAGNOSIS — J310 Chronic rhinitis: Secondary | ICD-10-CM

## 2021-04-17 DIAGNOSIS — J3489 Other specified disorders of nose and nasal sinuses: Secondary | ICD-10-CM

## 2021-04-18 ENCOUNTER — Encounter: Payer: Self-pay | Admitting: Specialist

## 2021-04-18 ENCOUNTER — Other Ambulatory Visit: Payer: Self-pay

## 2021-04-18 ENCOUNTER — Ambulatory Visit (INDEPENDENT_AMBULATORY_CARE_PROVIDER_SITE_OTHER): Payer: Self-pay | Admitting: Specialist

## 2021-04-18 ENCOUNTER — Encounter: Payer: Self-pay | Attending: Physician Assistant | Admitting: Dietician

## 2021-04-18 ENCOUNTER — Encounter: Payer: Self-pay | Admitting: Dietician

## 2021-04-18 VITALS — BP 108/76 | HR 93 | Ht 60.0 in | Wt 208.0 lb

## 2021-04-18 DIAGNOSIS — M542 Cervicalgia: Secondary | ICD-10-CM

## 2021-04-18 DIAGNOSIS — M48062 Spinal stenosis, lumbar region with neurogenic claudication: Secondary | ICD-10-CM

## 2021-04-18 DIAGNOSIS — E1165 Type 2 diabetes mellitus with hyperglycemia: Secondary | ICD-10-CM | POA: Insufficient documentation

## 2021-04-18 DIAGNOSIS — M25552 Pain in left hip: Secondary | ICD-10-CM

## 2021-04-18 DIAGNOSIS — M5136 Other intervertebral disc degeneration, lumbar region: Secondary | ICD-10-CM

## 2021-04-18 NOTE — Progress Notes (Signed)
Office Visit Note   Patient: Frances [name]           Date of Birth: 19-Mar-1976           MRN: [id number] Visit Date: 04/18/2021              Requested by: Donella Stade, PA-C [address] [place] [address] [place],  [place] [address] PCP: Donella Stade, PA-C   Assessment & Plan: Visit Diagnoses:  1. DDD (degenerative disc disease), lumbar   2. Cervicalgia   3. Spinal stenosis of lumbar region with neurogenic claudication   4. Pain in left hip     Plan: Avoid frequent bending and stooping  No lifting greater than 10 lbs. May use ice or moist heat for pain. Weight loss is of benefit. Best medication for lumbar disc disease is arthritis medications like motrin, celebrex and naprosyn. Exercise is important to improve your indurance and does allow people to function better inspite of back pain.   Follow-Up Instructions: Return in about 4 weeks (around 05/16/2021).   Orders:  No orders of the defined types were placed in this encounter.  No orders of the defined types were placed in this encounter.     Procedures: No procedures performed   Clinical Data: No additional findings.   Subjective: Chief Complaint  Patient presents with  . Lower Back - Pain    HPI  Review of Systems   Objective: Vital Signs: BP 108/76   Pulse 93   Ht 5' (1.524 m)   Wt 208 lb (94.3 kg)   BMI 40.62 kg/m   Physical Exam  Ortho Exam  Specialty Comments:  No specialty comments available.  Imaging: No results found.   PMFS History: Patient Active Problem List   Diagnosis Date Noted  . Hyperlipidemia LDL goal <70 03/18/2021  . Type 2 diabetes mellitus with hyperglycemia, without long-term current use of insulin ([place]) 03/13/2021  . Seasonal allergies 12/25/2020  . Chest congestion 11/20/2020  . Sinus drainage 11/20/2020  . Bilateral leg edema 11/19/2020  . Costochondritis 10/22/2020  . Rib pain on right side 10/22/2020  . Tachycardia 10/22/2020  .  Hepatomegaly 10/22/2020  . Symptomatic mammary hypertrophy 09/04/2020  . Xiphoid pain 08/13/2020  . Sternum pain 08/13/2020  . Class 1 obesity due to excess calories without serious comorbidity with body mass index (BMI) of 32.0 to 32.9 in adult 08/13/2020  . Macromastia 06/29/2020  . Recurrent major depressive disorder, in partial remission ([place]) 05/24/2020  . Generalized anxiety disorder 05/24/2020  . DDD (degenerative disc disease), cervical 03/13/2020  . Ingrowing left great toenail 03/13/2020  . Labral tear of hip, degenerative 01/11/2020  . Grief reaction 11/28/2019  . Toenail fungus 09/09/2019  . Vasovagal response 09/09/2019  . Decreased visual acuity 08/30/2018  . Right corneal abrasion 08/30/2018  . Elevated fasting glucose 04/08/2018  . Depression   . Anxiety 01/22/2018  . Borderline personality disorder ([place]) 01/22/2018  . Rheumatoid arthritis 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 ([place]) 12/09/2017  . TMJ (temporomandibular joint syndrome) 09/26/2017  . New daily persistent headache 09/26/2017  . MTHFR gene mutation 09/20/2017  . Bipolar I disorder ([place]) 09/16/2017  . Fibromyalgia 09/16/2017  . Chronic fatigue syndrome 09/16/2017  . DYSPNEA 10/15/2011  . Back pain 10/13/2008  . DERMATITIS, ALLERGIC 06/05/2008  . FATIGUE 05/05/2008  . Hypothyroidism 02/10/2008  . 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 DDD (degenerative disc disease), lumbar 02/10/2008  . FIBROMYALGIA 02/10/2008  . IRON,  SERUM, ELEVATED 02/10/2008   Past Medical History:  Diagnosis Date  . Anxiety   . Arthritis   . Back pain, chronic   . Bipolar 1 disorder ([place])   . Chronic fatigue syndrome   . DDD (degenerative disc disease), lumbar   . Depression   . Fibromyalgia   . IUD 2012   Mirena  . MTHFR gene mutation     Family History  Problem Relation Age of Onset  . Diabetes Mother   . Stroke Mother   . Lupus Mother   . Hypertension Mother   . Congestive Heart Failure Mother    . Mental illness Brother        Not clear what his diagnosis is--may be related to previous drug use.  . Cancer Maternal Grandmother        colon  . Depression Maternal Uncle   . Alcohol abuse Maternal Uncle   . Diabetes Maternal Grandfather     Past Surgical History:  Procedure Laterality Date  . COLONOSCOPY WITH PROPOFOL N/A 02/25/2017   Procedure: COLONOSCOPY WITH PROPOFOL;  Surgeon: Arta Silence, MD;  Location: [facility];  Service: Endoscopy;  Laterality: N/A;  . left knee lateral release  1993  . scar tisue removal  03/2005   left ankle  . TONSILLECTOMY  age 47's   Social History   Occupational History  . Occupation: Designer, industrial/product at times  Tobacco Use  . Smoking status: Never Smoker  . Smokeless tobacco: Never Used  Vaping Use  . Vaping Use: Never used  Substance and Sexual Activity  . Alcohol use: No    Alcohol/week: 0.0 standard drinks  . Drug use: No  . Sexual activity: Yes    Birth control/protection: I.U.D.

## 2021-04-18 NOTE — Patient Instructions (Signed)
Avoid frequent bending and stooping  No lifting greater than 10 lbs. May use ice or moist heat for pain. Weight loss is of benefit. Best medication for lumbar disc disease is arthritis medications like motrin, celebrex and naprosyn. Exercise is important to improve your indurance and does allow people to function better inspite of back pain.   

## 2021-04-18 NOTE — Progress Notes (Signed)
Patient was seen on 04/18/2021 for the first of a series of three diabetes self-management courses at [facility].  Patient Education Plan per assessed needs and concerns is to attend three course education program for Diabetes Self Management Education.  The following learning objectives were met by the patient during this class:  Describe diabetes, types of diabetes and pathophysiology  State some common risk factors for diabetes  Defines the role of glucose and insulin  Describe the relationship between diabetes and cardiovascular and other risks  State the members of the Healthcare Team  States the rationale for glucose monitoring and when to test  State their individual Target Range  State the importance of logging glucose readings and how to interpret the readings  Identifies A1C target  Explain the correlation between A1c and eAG values  State symptoms and treatment of high blood glucose and low blood glucose  Explain proper technique for glucose testing and identify proper sharps disposal  Handouts given during class include:  How to Thrive:  A Guide for Your Journey with Diabetes by the ADA  Meal Plan Card and carbohydrate content list  Dietary intake form  Low Sodium Flavoring Tips  Types of Fats  Dining Out  Label reading  Snack list  Planning a balanced meal  The diabetes portion plate  Diabetes Resources  A1c to eAG Conversion Chart  Blood Glucose Log  Diabetes Recommended Care Schedule  Support Group  Diabetes Success Plan  Core Class Satisfaction Survey   Follow-Up Plan:  Attend core 2  

## 2021-04-19 ENCOUNTER — Ambulatory Visit (INDEPENDENT_AMBULATORY_CARE_PROVIDER_SITE_OTHER): Payer: Self-pay | Admitting: Sports Medicine

## 2021-04-19 DIAGNOSIS — M5136 Other intervertebral disc degeneration, lumbar region: Secondary | ICD-10-CM

## 2021-04-19 DIAGNOSIS — M51369 Other intervertebral disc degeneration, lumbar region without mention of lumbar back pain or lower extremity pain: Secondary | ICD-10-CM

## 2021-04-19 NOTE — Progress Notes (Signed)
    Procedures performed today:    None.  Independent interpretation of notes and tests performed by another provider:   Lumbar spine MRI personally reviewed, the left L5 nerve root does appear to be compressed by the L4-L5 disc.  Brief History, Exam, Impression, and Recommendations:    DDD (degenerative disc disease), lumbar Frances [name] returns, she is a pleasant 45 year old female, she has had extensive conservative treatment for [name] lumbar disc disease including months of physical therapy, several epidurals, predominantly left L5-S1 interlaminars, gabapentin at 800 mg 3 times daily. She did get a consultation with Dr. Louanne Skye who is looking into another MRI but of [name] left hip. She does have a moderate sized central L5-S1 disc herniation that does contact the intraspinal left L5 nerve root. She does have some pain in [name] back with radiation to the lateral thigh. [name] left L4 nerve root does appear unimpeded. Previous injections have been interlaminar, this time we are going to proceed with a left L5-S1 transforaminal epidural. Return to see me 1 month later.    ___________________________________________ Frances [name]. Dianah Field, M.D., ABFM., CAQSM. Primary Care and [place] Instructor of [place] of [facility]

## 2021-04-19 NOTE — Assessment & Plan Note (Signed)
Frances [name] returns, Frances [name] is a pleasant 45 year old female, Frances [name] has had extensive conservative treatment for her lumbar disc disease including months of physical therapy, several epidurals, predominantly left L5-S1 interlaminars, gabapentin at 800 mg 3 times daily. Frances [name] did get a consultation with Dr. Louanne Skye who is looking into another MRI but of her left hip. Frances [name] does have a moderate sized central L5-S1 disc herniation that does contact the intraspinal left L5 nerve root. Frances [name] does have some pain in her back with radiation to the lateral thigh. Her left L4 nerve root does appear unimpeded. Previous injections have been interlaminar, this time we are going to proceed with a left L5-S1 transforaminal epidural. Return to see me 1 month later.

## 2021-04-23 ENCOUNTER — Telehealth (HOSPITAL_COMMUNITY): Payer: Self-pay | Admitting: Psychiatry

## 2021-04-23 ENCOUNTER — Telehealth (HOSPITAL_COMMUNITY): Payer: Self-pay | Admitting: Licensed Clinical Social Worker

## 2021-04-23 NOTE — Telephone Encounter (Signed)
Pt called regarding appt 04/23/21.  Pt states [employer] not working- pt unable to use video visit. Pt request [place] visit. Pt will log into MYCHART to check in. IF video does not work, please call pt for TELEPHONE call visit.

## 2021-04-23 NOTE — Telephone Encounter (Signed)
Pt called regarding appt 04/24/21.  Pt states [employer] not working- pt unable to use video visit. Pt request [place] visit. Pt will log into MYCHART to check in. IF video does not work, please call pt for TELEPHONE call visit.

## 2021-04-23 NOTE — Telephone Encounter (Signed)
Thank you for the update!

## 2021-04-24 ENCOUNTER — Other Ambulatory Visit: Payer: Self-pay

## 2021-04-24 ENCOUNTER — Encounter (HOSPITAL_COMMUNITY): Payer: Self-pay | Admitting: Psychiatry

## 2021-04-24 ENCOUNTER — Telehealth (INDEPENDENT_AMBULATORY_CARE_PROVIDER_SITE_OTHER): Payer: No Payment, Other | Admitting: Psychiatry

## 2021-04-24 DIAGNOSIS — F411 Generalized anxiety disorder: Secondary | ICD-10-CM | POA: Diagnosis not present

## 2021-04-24 DIAGNOSIS — F900 Attention-deficit hyperactivity disorder, predominantly inattentive type: Secondary | ICD-10-CM | POA: Diagnosis not present

## 2021-04-24 DIAGNOSIS — F319 Bipolar disorder, unspecified: Secondary | ICD-10-CM

## 2021-04-24 MED ORDER — BUPROPION HCL ER (XL) 150 MG PO TB24: 150.0000 mg | ORAL_TABLET | ORAL | 2 refills | Status: DC

## 2021-04-24 MED ORDER — ARIPIPRAZOLE 15 MG PO TABS: 15.0000 mg | ORAL_TABLET | Freq: Every day | ORAL | 2 refills | Status: DC

## 2021-04-24 MED ORDER — BENZTROPINE MESYLATE 0.5 MG PO TABS: 0.5000 mg | ORAL_TABLET | Freq: Every day | ORAL | 2 refills | Status: DC

## 2021-04-24 MED ORDER — TRAZODONE HCL 50 MG PO TABS: 50.0000 mg | ORAL_TABLET | Freq: Every evening | ORAL | 2 refills | Status: DC | PRN

## 2021-04-24 MED ORDER — BUPROPION HCL ER (XL) 300 MG PO TB24: 300.0000 mg | ORAL_TABLET | Freq: Every day | ORAL | 2 refills | Status: DC

## 2021-04-24 MED FILL — FAMOTIDINE 40 MG TABLET: ORAL | 60 days supply | Qty: 60 | Fill #2

## 2021-04-24 MED FILL — HYDROXYCHLOROQUINE 200 MG TABLET: ORAL | 30 days supply | Qty: 60 | Fill #1

## 2021-04-24 MED FILL — DICLOFENAC SODIUM 75 MG TABLET,DELAYED RELEASE: ORAL | 30 days supply | Qty: 60 | Fill #7

## 2021-04-24 MED FILL — CYMBALTA 30 MG CAPSULE,DELAYED RELEASE: ORAL | 30 days supply | Qty: 60 | Fill #1

## 2021-04-24 NOTE — Progress Notes (Signed)
[place] MD/PA/NP OP Progress Note Virtual Visit via Telephone Note  I connected with Frances [name] on 04/24/21 at 11:00 AM EDT by telephone and verified that I am speaking with the correct person using two identifiers.  Location: Patient: home Provider: Clinic   I discussed the limitations, risks, security and privacy concerns of performing an evaluation and management service by telephone and the availability of in person appointments. I also discussed with the patient that there may be a patient responsible charge related to this service. The patient expressed understanding and agreed to proceed.   I provided 30 minutes of non-face-to-face time during this encounter.      04/24/2021 11:04 AM Frances [name]  MRN:  [id number]  Chief Complaint: "I have had more depression and my primary care doctor wants to be tested for ADHD"  HPI: 45 year old female seen today for follow up psychiatric evaluation. She has a psychiatric history of bipolar 1, borderline personality disorder, depression, and anxiety. She is currently managed on Abilify 15 mg nightly, Cogentin 0.5 mg daily, Wellbutrin 300 mg daily, trazodone 50-100 mg nightly, and Cymbalta 30 mg daily (from rheumatologist), and gabapentin 800 mg three times daily (from rheumatologist which she recently started for fibromyalgia). Today, patient notes that medications is somewhat effective in managing her symptoms.   Today the patient was unable to login virtually so her assessment was done over the phone.  During exam she was pleasant, cooperative, and engaged in conversation.  She informed provider that since her last visit her depression has worsened.  Provider asked patient if there are any life changes that could have affected her depression and she notes that there was not.  Provider conducted a PHQ-9 and patient scored an 11, at her last visit she scored 18.  Provider also conducted a GAD-7 and patient scored a 3, at her last visit she  scored a 3.  Patient notes that her mood has been stable on her current dose of Abilify and denies symptoms of mania, SI/HI/VAH, or paranoia. She endorses adequate sleep and appetite.  She informed Probation officer that recently she found out that she has diabetes and notes that she cut back on her sugar intake.  She notes that she has been actively losing weight since changing her diet.  Patient informed Probation officer that her PCP informed her that they would like her to be tested for ADHD.  Today she endorses symptoms of forgetfulness, disorganization, avoidance of mentally tasking task, poor listening skills, poor organization skills, and notes that she loses things at times.  Today she is agreeable to increasing Wellbutrin XL 300 mg to Wellbutrin XL450 mg to help manage ADHD and symptoms of depression.  She is agreeable to continuing all other medications as prescribed. Patient will follow up with outpatient counselor for therapy. No other concerns noted at this time.   Visit Diagnosis:    ICD-10-CM   1. Attention deficit hyperactivity disorder (ADHD), predominantly inattentive type  F90.0 buPROPion (WELLBUTRIN XL) 300 MG 24 hr tablet    buPROPion (WELLBUTRIN XL) 150 MG 24 hr tablet  2. Bipolar I disorder (HCC)  F31.9 ARIPiprazole (ABILIFY) 15 MG tablet    benztropine (COGENTIN) 0.5 MG tablet  3. Recurrent major depressive disorder, in partial remission (HCC)  F33.41 ARIPiprazole (ABILIFY) 15 MG tablet    traZODone (DESYREL) 50 MG tablet    buPROPion (WELLBUTRIN XL) 150 MG 24 hr tablet  4. Generalized anxiety disorder  F41.1 buPROPion (WELLBUTRIN XL) 300 MG 24 hr  tablet    Past Psychiatric History: Bipolar, anxiety, and depression Past Medical History:  Past Medical History:  Diagnosis Date  . Anxiety   . Arthritis   . Back pain, chronic   . Bipolar 1 disorder ([place])   . Chronic fatigue syndrome   . DDD (degenerative disc disease), lumbar   . Depression   . Diabetes mellitus without complication ([place])    . Fibromyalgia   . IUD 2012   Mirena  . MTHFR gene mutation     Past Surgical History:  Procedure Laterality Date  . COLONOSCOPY WITH PROPOFOL N/A 02/25/2017   Procedure: COLONOSCOPY WITH PROPOFOL;  Surgeon: Arta Silence, MD;  [facility]: [facility];  Service: Endoscopy;  Laterality: N/A;  . left knee lateral release  1993  . scar tisue removal  03/2005   left ankle  . TONSILLECTOMY  age 79's    Family Psychiatric History: Maternal aunts Bipolar disorder and uncle alcoholic  Family History:  Family History  Problem Relation Age of Onset  . Diabetes Mother   . Stroke Mother   . Lupus Mother   . Hypertension Mother   . Congestive Heart Failure Mother   . Mental illness Brother        Not clear what his diagnosis is--may be related to previous drug use.  . Cancer Maternal Grandmother        colon  . Depression Maternal Uncle   . Alcohol abuse Maternal Uncle   . Diabetes Maternal Grandfather     Social History:  Social History   Socioeconomic History  . Marital status: Single    Spouse name: Not on file  . Number of children: 0  . Years of education: Not on file  . Highest education level: Bachelor's degree (e.g., BA, AB, BS)  Occupational History  . Occupation: Designer, industrial/product at times  Tobacco Use  . Smoking status: Never Smoker  . Smokeless tobacco: Never Used  Vaping Use  . Vaping Use: Never used  Substance and Sexual Activity  . Alcohol use: No    Alcohol/week: 0.0 standard drinks  . Drug use: No  . Sexual activity: Yes    Birth control/protection: I.U.D.  Other Topics Concern  . Not on file  Social History Narrative   Originally from [place], outside of [place].    Moved here permanently 2012.   Intermittently worked on a farm.   Lives with many cats--3 plus fosters cats.    Single, lives alone in a one story home. Rarely drinks caffeine. 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   Social Determinants of Health   Financial  Resource Strain: Medium Risk  . Difficulty of Paying Living Expenses: Somewhat hard  Food Insecurity: No Food Insecurity  . Worried About Charity fundraiser in the Last Year: Never true  . Ran Out of Food in the Last Year: Never true  Transportation Needs: No Transportation Needs  . Lack of Transportation (Medical): No  . Lack of Transportation (Non-Medical): No  Physical Activity: Inactive  . Days of Exercise per Week: 0 days  . Minutes of Exercise per Session: 0 min  Stress: No Stress Concern Present  . Feeling of Stress : Not at all  Social Connections: Moderately Isolated  . Frequency of Communication with Friends and Family: More than three times a week  . Frequency of Social Gatherings with Friends and Family: More than three times a week  . Attends Religious Services: Never  . Active Member  of Clubs or Organizations: Yes  . Attends Archivist Meetings: More than 4 times per year  . Marital Status: Never married    Allergies:  Allergies  Allergen Reactions  . Metaxalone Hives and Other (See Comments)    Metabolic Disorder Labs: Lab Results  Component Value Date   HGBA1C 7.0 03/08/2021   MPG 103 04/14/2018   No results found for: PROLACTIN Lab Results  Component Value Date   CHOL 130 04/14/2018   TRIG 192 (H) 04/14/2018   HDL 28 (L) 04/14/2018   CHOLHDL 4.6 04/14/2018   LDLCALC 73 04/14/2018   LDLCALC 99 03/04/2016   Lab Results  Component Value Date   TSH 0.866 11/21/2020   TSH 1.616 01/27/2018    Therapeutic Level Labs: No results found for: LITHIUM No results found for: VALPROATE No components found for:  CBMZ  Current Medications: Current Outpatient Medications  Medication Sig Dispense Refill  . buPROPion (WELLBUTRIN XL) 150 MG 24 hr tablet Take 1 tablet (150 mg total) by mouth every morning. 30 tablet 2  . ARIPiprazole (ABILIFY) 15 MG tablet Take 1 tablet (15 mg total) by mouth daily. 30 tablet 2  . atorvastatin (LIPITOR) 10 MG tablet  Take 1 tablet (10 mg total) by mouth daily. 90 tablet 1  . Azelastine HCl 137 MCG/SPRAY SOLN PLACE 2 APPLICATORS INTO ALTERNATE NOSTRILS TWICE A DAY 30 mL 2  . benztropine (COGENTIN) 0.5 MG tablet Take 1 tablet (0.5 mg total) by mouth daily. 30 tablet 2  . buPROPion (WELLBUTRIN XL) 300 MG 24 hr tablet Take 1 tablet (300 mg total) by mouth daily. 30 tablet 2  . cholecalciferol (VITAMIN D3) 25 MCG (1000 UT) tablet Take 1,000 Units by mouth daily.    . DULoxetine (CYMBALTA) 60 MG capsule Take 60 mg by mouth daily.    . famotidine (PEPCID) 20 MG tablet Take 20 mg by mouth daily.    . fluticasone (FLONASE) 50 MCG/ACT nasal spray USE 2 SPRAYS IN EACH NOSTRIL EVERY DAY 16 g 1  . gabapentin (NEURONTIN) 800 MG tablet Take 1 tablet (800 mg total) by mouth 3 (three) times daily. 90 tablet 3  . hydrochlorothiazide (HYDRODIURIL) 12.5 MG tablet Take 1 tablet (12.5 mg total) by mouth daily. 30 tablet 2  . HYDROcodone-acetaminophen (NORCO/VICODIN) 5-325 MG tablet Take 1 tablet by mouth 2 (two) times daily as needed for moderate pain (back pain). (Patient not taking: Reported on 04/18/2021) 60 tablet 0  . hydroxychloroquine (PLAQUENIL) 200 MG tablet Take 200 mg by mouth in the morning and at bedtime.    [name] levocetirizine (XYZAL) 5 MG tablet TAKE 1 TABLET BY MOUTH EVERY EVENING 90 tablet 1  . lisinopril (ZESTRIL) 5 MG tablet Take 1 tablet (5 mg total) by mouth daily. 90 tablet 1  . meloxicam (MOBIC) 15 MG tablet Take 1 tablet by mouth daily.    . metFORMIN (GLUCOPHAGE) 1000 MG tablet Take 1 tablet (1,000 mg total) by mouth 2 (two) times daily with a meal. 180 tablet 1  . methocarbamol (ROBAXIN) 500 MG tablet Take 1 tablet (500 mg total) by mouth 3 (three) times daily. (Patient not taking: Reported on 04/18/2021) 90 tablet 1  . montelukast (SINGULAIR) 10 MG tablet TAKE 1 Tablet BY MOUTH ONCE EVERY NIGHT AT BEDTIME 90 tablet 1  . pantoprazole (PROTONIX) 40 MG tablet TAKE 1 TABLET BY MOUTH DAILY 90 tablet 1  . topiramate  (TOPAMAX) 25 MG tablet Take one tablet at bedtime. (Patient not taking: Reported on 04/18/2021)  30 tablet 1  . traZODone (DESYREL) 50 MG tablet Take 1 tablet (50 mg total) by mouth at bedtime as needed for sleep. 60 tablet 2   No current facility-administered medications for this visit.     Musculoskeletal: Strength & Muscle Tone: Unable to assess due to telephone visit Gait & Station: Unable to assess due to telephone visit Patient leans: N/A  Psychiatric Specialty Exam: Review of Systems  There were no vitals taken for this visit.There is no height or weight on file to calculate BMI.  General Appearance: Unable to assess due to telephone visit  Eye Contact:  Good  Speech:  Clear and Coherent and Normal Rate  Volume:  Normal  Mood:  Depressed  Affect:  Congruent  Thought Process:  Coherent, Goal Directed and Linear  Orientation:  Full (Time, Place, and Person)  Thought Content: WDL and Logical   Suicidal Thoughts:  No  Homicidal Thoughts:  No  Memory:  Immediate;   Good Recent;   Good Remote;   Good  Judgement:  Good  Insight:  Good  Psychomotor Activity:  Unable to assess due to telephone visit  Concentration:  Concentration: Fair and Attention Span: Fair  Recall:  Good  Fund of Knowledge: Good  Language: Good  Akathisia:  No  Handed:  Right  AIMS (if indicated): Not done  Assets:  Communication Skills Desire for Improvement Financial Resources/Insurance Housing Social Support  ADL's:  Intact  Cognition: WNL  Sleep:  Good   Screenings: AIMS   Flowsheet Row Admission (Discharged) from 01/25/2018 in [facility]  AIMS Total Score 0    AUDIT   Flowsheet Row Admission (Discharged) from 01/25/2018 in [facility]  Alcohol Use Disorder Identification Test Final Score (AUDIT) 0    GAD-7   Flowsheet Row Video Visit from 04/24/2021 in [facility] Video Visit from [facility]/24[facility]2021 in  [facility] Counselor from 12/06/2020 in [facility] Counselor from 10/04/2020 in [facility] Office Visit from 11/28/2019 in [place]  Total GAD-7 Score 3 3 3 3 4     PHQ2-9   Flowsheet Row Video Visit from 04/24/2021 in [facility] Nutrition from [facility]18[facility]2021 in [facility] Video Visit from 01/24/2021 in [facility] Counselor from 12/06/2020 in [facility] Counselor from 10/04/2020 in [facility]  PHQ-2 Total Score 3 1 3 3 4   PHQ-9 Total Score 11 -- 10 11 12     Flowsheet Row Counselor from 01/30/2021 in [facility] No Risk       Assessment and Plan: Patient her depression has worsened since her last visit.  She also endorses symptoms of ADHD. Today she is agreeable to increasing Wellbutrin XL 300 mg to Wellbutrin XL450 mg to help manage ADHD and symptoms of depression.  She is agreeable to continuing all other medications as prescribed. Patient will follow up with outpatient counselor for therapy.  1. Bipolar I disorder (HCC)  Continue- ARIPiprazole (ABILIFY) 15 MG tablet; Take 1 tablet (15 mg total) by mouth daily.  Dispense: 30 tablet; Refill: 2 Continue- benztropine (COGENTIN) 0.5 MG tablet; Take 1 tablet (0.5 mg total) by mouth daily.  Dispense: 30 tablet; Refill: 2 Continue- buPROPion (WELLBUTRIN XL) 300 MG 24 hr tablet; Take 1 tablet (300 mg total) by  mouth daily.  Dispense: 30 tablet; Refill: 2 Continue- traZODone (DESYREL) 50 MG tablet; Take 1 tablet (50 mg total) by mouth at bedtime as needed for sleep.  Dispense: 60 tablet; Refill: 2 Start- buPROPion (WELLBUTRIN XL) 150 MG 24 hr tablet; Take 1 tablet (150 mg total) by mouth every morning.  Dispense: 30 tablet; Refill: 2  2.  Generalized anxiety disorder  Continue- traZODone (DESYREL) 50 MG tablet; Take 1 tablet (50 mg total) by mouth at bedtime as needed for sleep.  Dispense: 60 tablet; Refill: 2  3. Attention deficit hyperactivity disorder (ADHD), predominantly inattentive type  Continue- buPROPion (WELLBUTRIN XL) 300 MG 24 hr tablet; Take 1 tablet (300 mg total) by mouth daily.  Dispense: 30 tablet; Refill: 2 Start- buPROPion (WELLBUTRIN XL) 150 MG 24 hr tablet; Take 1 tablet (150 mg total) by mouth every morning.  Dispense: 30 tablet; Refill: 2   Follow up in 3 moths Follow up with therapy   Salley Slaughter, NP 04/24/2021, 11:04 AM

## 2021-04-25 ENCOUNTER — Encounter: Payer: Self-pay | Admitting: Dietician

## 2021-04-25 ENCOUNTER — Encounter: Payer: No Typology Code available for payment source | Attending: Physician Assistant | Admitting: Dietician

## 2021-04-25 ENCOUNTER — Other Ambulatory Visit: Payer: Self-pay

## 2021-04-25 ENCOUNTER — Ambulatory Visit (INDEPENDENT_AMBULATORY_CARE_PROVIDER_SITE_OTHER): Payer: No Payment, Other | Admitting: Licensed Clinical Social Worker

## 2021-04-25 ENCOUNTER — Telehealth: Payer: Self-pay | Admitting: Specialist

## 2021-04-25 DIAGNOSIS — E1165 Type 2 diabetes mellitus with hyperglycemia: Secondary | ICD-10-CM | POA: Insufficient documentation

## 2021-04-25 DIAGNOSIS — E119 Type 2 diabetes mellitus without complications: Secondary | ICD-10-CM | POA: Insufficient documentation

## 2021-04-25 DIAGNOSIS — F319 Bipolar disorder, unspecified: Secondary | ICD-10-CM

## 2021-04-25 NOTE — Progress Notes (Signed)
Patient was seen on 04/25/2021 for the second of a series of three diabetes self-management courses at the [facility]. The following learning objectives were met by the patient during this class:   Describe the role of different macronutrients on glucose  Explain how carbohydrates affect blood glucose  State what foods contain the most carbohydrates  Demonstrate carbohydrate counting  Demonstrate how to read Nutrition Facts food label  Describe effects of various fats on heart health  Describe the importance of good nutrition for health and healthy eating strategies  Describe techniques for managing your shopping, cooking and meal planning  List strategies to follow meal plan when dining out  Describe the effects of alcohol on glucose and how to use it safely  Goals:  Follow Diabetes Meal Plan as instructed  Aim to spread carbs evenly throughout the day  Aim for 3 meals per day and snacks as needed Include lean protein foods to meals/snacks  Monitor glucose levels as instructed by your doctor   Follow-Up Plan:  Attend Core 3  Work towards following your personal food plan.

## 2021-04-25 NOTE — Telephone Encounter (Signed)
Pt called and would like to know if Dr.Nitka could go ahead and order that MRI? Cb [phone]

## 2021-04-25 NOTE — Progress Notes (Signed)
   THERAPIST PROGRESS NOTE  Session Time: 38  Participation Level: Active  Behavioral Response: NAAlertDepressed  Type of Therapy: Individual Therapy  Treatment Goals addressed: Diagnosis: bipolar depression   Interventions: CBT and Supportive  Summary: Frances [name] is a 45 y.o. female who presents with bipolar depression.   Suicidal/Homicidal: NAwithout intent/plan  Virtual Visit via Telephone Note  I connected with Frances [name] on 04/25/21 at  3:00 PM EDT by telephone and verified that I am speaking with the correct person using two identifiers.  Location: Patient: [facility]  Provider: [facility]    I discussed the limitations, risks, security and privacy concerns of performing an evaluation and management service by telephone and the availability of in person appointments. I also discussed with the patient that there may be a patient responsible charge related to this service. The patient expressed understanding and agreed to proceed.    Subjective/Objective:  Pt was alert and oriented x 5. She was dressed casually and engaged well in therapy. Pt presented with depressed mood/affect.   Pt reports that she is frustrated with her progress. LCSW has provided pt with 1. Writing letter to father and burning it for closure, 2. Journaling 3. Walking, 4. volunteer work 5. Coloring 6. [place] [facility]. With limited progress. Frances [name] asked to be seen by different therapist. LCSW asked admin staff if this was possible and stated this pt maybe in [facility].  LCSW asked pt to bring in a letter stating that she was a [facility]. Once that letter is received it will go to [place] to discuss with the county. If it is determined that she should be receiving [facility] than she will Tx to a different therapist in [facility]. If she is not a [facility] resident than pt will be referred to [facility] for  Case Management to be directed to a different service in her county. Frances [name] Agreeable to plan.    Assessment: Frances [name] endorse symptoms for depression, irritability, mood swing, isolation poor motivation, and worthlessness. She does meet criteria for bipolar depression and is taking medication as prescribed. Plan listed above.     I discussed the assessment and treatment plan with the patient. The patient was provided an opportunity to ask questions and all were answered. The patient agreed with the plan and demonstrated an understanding of the instructions.   The patient was advised to call back or seek an in-person evaluation if the symptoms worsen or if the condition fails to improve as anticipated.  I provided 45 minutes of non-face-to-face time during this encounter.   Frances Horn, LCSW   Plan: Pt to follow up with possibly new provider if she can provide proof of [facility].      Frances Horn, LCSW 04/25/2021

## 2021-04-26 ENCOUNTER — Ambulatory Visit
Admission: RE | Admit: 2021-04-26 | Discharge: 2021-04-26 | Disposition: A | Discharge status: Home / Self Care | Payer: No Typology Code available for payment source | Source: Ambulatory Visit | Attending: Sports Medicine | Admitting: Sports Medicine

## 2021-04-26 DIAGNOSIS — M5136 Other intervertebral disc degeneration, lumbar region: Secondary | ICD-10-CM

## 2021-04-26 MED ORDER — METHYLPREDNISOLONE ACETATE 40 MG/ML INJ SUSP (RADIOLOG
120.0000 mg | Freq: Once | INTRAMUSCULAR | Status: AC
  Administered 2021-04-26: 120 mg via EPIDURAL

## 2021-04-26 MED ORDER — IOPAMIDOL (ISOVUE-M 200) INJECTION 41%
1.0000 mL | Freq: Once | INTRAMUSCULAR | Status: AC
  Administered 2021-04-26: 1 mL via EPIDURAL

## 2021-04-26 NOTE — Discharge Instructions (Signed)

## 2021-05-02 ENCOUNTER — Other Ambulatory Visit: Payer: Self-pay

## 2021-05-02 ENCOUNTER — Encounter: Payer: Self-pay | Admitting: Dietician

## 2021-05-02 DIAGNOSIS — E119 Type 2 diabetes mellitus without complications: Secondary | ICD-10-CM

## 2021-05-02 NOTE — Progress Notes (Signed)
Patient was seen on 05/02/2021 for the third of a series of three diabetes self-management courses at the [facility].   Catalina Gravel the amount of activity recommended for healthy living . Describe activities suitable for individual needs . Identify ways to regularly incorporate activity into daily life . Identify barriers to activity and ways to over come these barriers  Identify diabetes medications being personally used and their primary action for lowering glucose and possible side effects . Describe role of stress on blood glucose and develop strategies to address psychosocial issues . Identify diabetes complications and ways to prevent them  Explain how to manage diabetes during illness . Evaluate success in meeting personal goal . Establish 2-3 goals that they will plan to diligently work on  Goals:   I will count my carb choices at most meals and snacks  I will be active 10 minutes or more 3 times a week  I will eat less unhealthy fats by eating less   To help manage stress I will  color at least 4 times a week  Your patient has identified these potential barriers to change:  Motivation   Your patient has identified their diabetes self-care support plan as  On-line Resources   Plan:  Attend Support Group as desired

## 2021-05-05 ENCOUNTER — Encounter: Payer: Self-pay | Admitting: Emergency Medicine

## 2021-05-05 ENCOUNTER — Emergency Department (INDEPENDENT_AMBULATORY_CARE_PROVIDER_SITE_OTHER)
Admission: EM | Admit: 2021-05-05 | Discharge: 2021-05-05 | Disposition: A | Discharge status: Home / Self Care | Payer: No Typology Code available for payment source | Source: Home / Self Care

## 2021-05-05 ENCOUNTER — Other Ambulatory Visit: Payer: Self-pay

## 2021-05-05 DIAGNOSIS — R21 Rash and other nonspecific skin eruption: Secondary | ICD-10-CM

## 2021-05-05 MED ORDER — METHYLPREDNISOLONE 4 MG PO TBPK: ORAL_TABLET | ORAL | 0 refills | Status: DC

## 2021-05-05 NOTE — ED Triage Notes (Signed)
Rash to back of left leg behind knee Pt thinks it may be a tick bite  Unsure of time frame of tick bite Noted rash this morning  No meds OTC

## 2021-05-05 NOTE — ED Provider Notes (Signed)
[name]    CSN: [id number] Arrival date & time: 05/05/21  1430      History   Chief Complaint Chief Complaint  Patient presents with  . Rash    HPI Frances [name] is a 45 y.o. female.   HPI 45 year old female presents with a rash of left leg for 1 day.  Patient believes rash may be from tick bite from 2 weeks ago but is unsure if she was even bitten by a tick.  Patient is not certain whether she pulled off a tick or not from her left leg.  Past Medical History:  Diagnosis Date  . Anxiety   . Arthritis   . Back pain, chronic   . Bipolar 1 disorder ([place])   . Chronic fatigue syndrome   . DDD (degenerative disc disease), lumbar   . Depression   . Diabetes mellitus without complication ([place])   . Fibromyalgia   . IUD 2012   Mirena  . MTHFR gene mutation     Patient Active Problem List   Diagnosis Date Noted  . Attention deficit hyperactivity disorder (ADHD), 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  . Hyperlipidemia LDL goal <70 03/18/2021  . Type 2 diabetes mellitus with hyperglycemia, without long-term current use of insulin ([place]) 03/13/2021  . Seasonal allergies 12/25/2020  . Chest congestion 11/20/2020  . Sinus drainage 11/20/2020  . Bilateral leg edema 11/19/2020  . Costochondritis 10/22/2020  . Rib pain on right side 10/22/2020  . Tachycardia 10/22/2020  . Hepatomegaly 10/22/2020  . Symptomatic mammary hypertrophy 09/04/2020  . Xiphoid pain 08/13/2020  . Sternum pain 08/13/2020  . Class 1 obesity due to excess calories without serious comorbidity with body mass index (BMI) of 32.0 to 32.9 in adult 08/13/2020  . Macromastia 06/29/2020  . Recurrent major depressive disorder, in partial remission ([place]) 05/24/2020  . Generalized anxiety disorder 05/24/2020  . DDD (degenerative disc disease), cervical 03/13/2020  . Ingrowing left great toenail 03/13/2020  . Labral tear of hip, degenerative 01/11/2020  . Grief reaction 11/28/2019  . Toenail  fungus 09/09/2019  . Vasovagal response 09/09/2019  . Decreased visual acuity 08/30/2018  . Right corneal abrasion 08/30/2018  . Elevated fasting glucose 04/08/2018  . Depression   . Anxiety 01/22/2018  . Borderline personality disorder ([place]) 01/22/2018  . Rheumatoid arthritis 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 ([place]) 12/09/2017  . TMJ (temporomandibular joint syndrome) 09/26/2017  . New daily persistent headache 09/26/2017  . MTHFR gene mutation 09/20/2017  . Bipolar I disorder ([place]) 09/16/2017  . Fibromyalgia 09/16/2017  . Chronic fatigue syndrome 09/16/2017  . DYSPNEA 10/15/2011  . Back pain 10/13/2008  . DERMATITIS, ALLERGIC 06/05/2008  . FATIGUE 05/05/2008  . Hypothyroidism 02/10/2008  . 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 DDD (degenerative disc disease), lumbar 02/10/2008  . FIBROMYALGIA 02/10/2008  . IRON, SERUM, ELEVATED 02/10/2008    Past Surgical History:  Procedure Laterality Date  . COLONOSCOPY WITH PROPOFOL N/A 02/25/2017   Procedure: COLONOSCOPY WITH PROPOFOL;  Surgeon: Arta Silence, MD;  Location: [facility];  Service: Endoscopy;  Laterality: N/A;  . left knee lateral release  1993  . scar tisue removal  03/2005   left ankle  . TONSILLECTOMY  age 55's    OB History    Gravida  0   Para  0   Term  0   Preterm  0   AB  0   Living  0     SAB  0   IAB  0  Ectopic  0   Multiple  0   Live Births               Home Medications    Prior to Admission medications   Medication Sig Start Date End Date Taking? Authorizing Provider  ARIPiprazole (ABILIFY) 15 MG tablet Take 1 tablet (15 mg total) by mouth daily. 04/24/21  Yes Eulis Canner E, NP  atorvastatin (LIPITOR) 10 MG tablet Take 1 tablet (10 mg total) by mouth daily. 03/13/21  Yes Breeback, Jade L, PA-C  Azelastine HCl 137 MCG/SPRAY SOLN PLACE 2 APPLICATORS INTO ALTERNATE NOSTRILS TWICE A DAY 04/18/21  Yes Breeback, Jade L, PA-C  benztropine (COGENTIN) 0.5 MG tablet  Take 1 tablet (0.5 mg total) by mouth daily. 04/24/21  Yes Eulis Canner E, NP  buPROPion (WELLBUTRIN XL) 150 MG 24 hr tablet Take 1 tablet (150 mg total) by mouth every morning. 04/24/21  Yes Eulis Canner E, NP  buPROPion (WELLBUTRIN XL) 300 MG 24 hr tablet Take 1 tablet (300 mg total) by mouth daily. 04/24/21  Yes Eulis Canner E, NP  cholecalciferol (VITAMIN D3) 25 MCG (1000 UT) tablet Take 1,000 Units by mouth daily.   Yes [provider]  DULoxetine (CYMBALTA) 60 MG capsule Take 60 mg by mouth daily.   Yes [provider]  famotidine (PEPCID) 20 MG tablet Take 20 mg by mouth daily.   Yes [provider]  fluticasone (FLONASE) 50 MCG/ACT nasal spray USE 2 SPRAYS IN EACH NOSTRIL EVERY DAY 04/09/21  Yes Breeback, Jade L, PA-C  gabapentin (NEURONTIN) 800 MG tablet Take 1 tablet (800 mg total) by mouth 3 (three) times daily. 01/23/21  Yes Silverio Decamp, MD  hydrochlorothiazide (HYDRODIURIL) 12.5 MG tablet Take 1 tablet (12.5 mg total) by mouth daily. 11/19/20  Yes Breeback, Jade L, PA-C  hydroxychloroquine (PLAQUENIL) 200 MG tablet Take 200 mg by mouth in the morning and at bedtime. 03/08/21 03/08/22 Yes [provider]  levocetirizine (XYZAL) 5 MG tablet TAKE 1 TABLET BY MOUTH EVERY EVENING 03/13/21  Yes Breeback, Jade L, PA-C  lisinopril (ZESTRIL) 5 MG tablet Take 1 tablet (5 mg total) by mouth daily. 03/13/21  Yes Breeback, Jade L, PA-C  meloxicam (MOBIC) 15 MG tablet Take 1 tablet by mouth daily. 03/08/21 03/08/22 Yes [provider]  metFORMIN (GLUCOPHAGE) 1000 MG tablet Take 1 tablet (1,000 mg total) by mouth 2 (two) times daily with a meal. 03/13/21  Yes Breeback, Jade L, PA-C  methylPREDNISolone (MEDROL DOSEPAK) 4 MG TBPK tablet Take as directed 05/05/21  Yes Eliezer Lofts, FNP  montelukast (SINGULAIR) 10 MG tablet TAKE 1 Tablet BY MOUTH ONCE EVERY NIGHT AT BEDTIME 12/03/20  Yes Breeback, Jade L, PA-C  pantoprazole (PROTONIX) 40 MG tablet  TAKE 1 TABLET BY MOUTH DAILY 12/26/20  Yes Breeback, Jade L, PA-C  traZODone (DESYREL) 50 MG tablet Take 1 tablet (50 mg total) by mouth at bedtime as needed for sleep. 04/24/21  Yes Eulis Canner E, NP  HYDROcodone-acetaminophen (NORCO/VICODIN) 5-325 MG tablet Take 1 tablet by mouth 2 (two) times daily as needed for moderate pain (back pain). Patient not taking: Reported on 04/18/2021 02/07/21   Silverio Decamp, MD  methocarbamol (ROBAXIN) 500 MG tablet Take 1 tablet (500 mg total) by mouth 3 (three) times daily. Patient not taking: Reported on 04/18/2021 12/26/20   Donella Stade, PA-C  topiramate (TOPAMAX) 25 MG tablet Take one tablet at bedtime. Patient not taking: Reported on 04/18/2021 11/19/20   Donella Stade, PA-C  Family History Family History  Problem Relation Age of Onset  . Diabetes Mother   . Stroke Mother   . Lupus Mother   . Hypertension Mother   . Congestive Heart Failure Mother   . Mental illness Brother        Not clear what his diagnosis is--may be related to previous drug use.  . Cancer Maternal Grandmother        colon  . Depression Maternal Uncle   . Alcohol abuse Maternal Uncle   . Diabetes Maternal Grandfather     Social History Social History   Tobacco Use  . Smoking status: Never Smoker  . Smokeless tobacco: Never Used  Vaping Use  . Vaping Use: Never used  Substance Use Topics  . Alcohol use: No    Alcohol/week: 0.0 standard drinks  . Drug use: No     Allergies   Metaxalone   Review of Systems Review of Systems  Constitutional: Negative.   HENT: Negative.   Eyes: Negative.   Respiratory: Negative.   Cardiovascular: Negative.   Gastrointestinal: Negative.   Genitourinary: Negative.   Musculoskeletal: Negative.   Skin: Positive for rash.  Neurological: Negative.      Physical Exam Triage Vital Signs ED Triage Vitals  Enc Vitals Group     BP 05/05/21 1502 107/75     Pulse Rate 05/05/21 1502 100     Resp 05/05/21 1502  16     Temp 05/05/21 1502 98.3 F (36.8 C)     Temp Source 05/05/21 1502 Oral     SpO2 05/05/21 1502 96 %     Weight --      Height --      Head Circumference --      Peak Flow --      Pain Score 05/05/21 1503 0     Pain Loc --      Pain Edu? --      Excl. in [place]? --    No data found.  Updated Vital Signs BP 107/75 (BP Location: Right Arm)   Pulse 100   Temp 98.3 F (36.8 C) (Oral)   Resp 16   LMP 05/02/2021 (Exact Date)   SpO2 96%   Physical Exam Vitals and nursing note reviewed.  Constitutional:      General: She is not in acute distress.    Appearance: Normal appearance. She is not ill-appearing.  HENT:     Head: Normocephalic and atraumatic.     Nose: Nose normal.     Mouth/Throat:     Mouth: Mucous membranes are moist.     Pharynx: Oropharynx is clear.  Eyes:     Extraocular Movements: Extraocular movements intact.     Conjunctiva/sclera: Conjunctivae normal.     Pupils: Pupils are equal, round, and reactive to light.  Cardiovascular:     Rate and Rhythm: Normal rate and regular rhythm.     Pulses: Normal pulses.     Heart sounds: Normal heart sounds.  Pulmonary:     Effort: Pulmonary effort is normal.     Breath sounds: Normal breath sounds.     Comments: No adventitious breath sounds Musculoskeletal:     Cervical back: Normal range of motion and neck supple. No tenderness.  Lymphadenopathy:     Cervical: No cervical adenopathy.  Skin:    General: Skin is warm and dry.     Comments: Left lower leg (posterior superior aspect): ~ 6.0 cm x 6.0 cm irregular shaped area, slightly raised  maculopapular rash noted  Neurological:     General: No focal deficit present.     Mental Status: She is alert and oriented to person, place, and time.  Psychiatric:        Mood and Affect: Mood normal.        Behavior: Behavior normal.      UC Treatments / Results  Labs (all labs ordered are listed, but only abnormal results are displayed) Labs Reviewed - No data to  display  EKG   Radiology No results found.  Procedures Procedures (including critical care time)  Medications Ordered in UC Medications - No data to display  Initial Impression / Assessment and Plan / UC Course  I have reviewed the triage vital signs and the nursing notes.  Pertinent labs & imaging results that were available during my care of the patient were reviewed by me and considered in my medical decision making (see chart for details).     MDM: 1.  Rash and nonspecific skin eruption.  Patient discharged home, hemodynamically stable Final Clinical Impressions(s) / UC Diagnoses   Final diagnoses:  Rash and nonspecific skin eruption     Discharge Instructions     Advised patient to take medication as directed to completion encourage patient increase daily water intake while taking medication.    ED Prescriptions    Medication Sig Dispense Auth. Provider   methylPREDNISolone (MEDROL DOSEPAK) 4 MG TBPK tablet Take as directed 1 each Eliezer Lofts, FNP     PDMP not reviewed this encounter.   Eliezer Lofts, [place] 05/05/21 1542

## 2021-05-05 NOTE — Discharge Instructions (Addendum)
Advised patient to take medication as directed to completion encourage patient increase daily water intake while taking medication.

## 2021-05-06 ENCOUNTER — Encounter: Payer: Self-pay | Admitting: Physician Assistant

## 2021-05-06 ENCOUNTER — Encounter: Payer: Self-pay | Admitting: Specialist

## 2021-05-06 ENCOUNTER — Ambulatory Visit (INDEPENDENT_AMBULATORY_CARE_PROVIDER_SITE_OTHER): Payer: No Typology Code available for payment source | Admitting: Physician Assistant

## 2021-05-06 VITALS — BP 117/77 | HR 86 | Temp 98.7°F | Ht 67.0 in | Wt 189.0 lb

## 2021-05-06 DIAGNOSIS — S80862D Insect bite (nonvenomous), left lower leg, subsequent encounter: Secondary | ICD-10-CM

## 2021-05-06 DIAGNOSIS — W57XXXD Bitten or stung by nonvenomous insect and other nonvenomous arthropods, subsequent encounter: Secondary | ICD-10-CM

## 2021-05-06 DIAGNOSIS — R21 Rash and other nonspecific skin eruption: Secondary | ICD-10-CM

## 2021-05-06 MED ORDER — DOXYCYCLINE HYCLATE 100 MG PO TABS: 100.0000 mg | ORAL_TABLET | Freq: Two times a day (BID) | ORAL | 0 refills | Status: DC

## 2021-05-06 MED ORDER — KETOCONAZOLE 2 % EX CREA: 1.0000 "application " | TOPICAL_CREAM | Freq: Two times a day (BID) | CUTANEOUS | 0 refills | Status: DC

## 2021-05-06 NOTE — Patient Instructions (Signed)
Tick Bite Information, Adult  Ticks are insects that can bite. Most ticks live in shrubs and grassy areas. They climb onto people and animals that go by. Then they bite. Some ticks carry germs that can make you sick. How can I prevent tick bites? Take these steps: Use insect repellent  Use an insect repellent that has 20% or higher of the ingredients DEET, picaridin, or IR3535. Follow the instructions on the label. Put it on: ? Bare skin. ? The tops of your boots. ? Your pant legs. ? The ends of your sleeves.  If you use an insect repellent that has the ingredient permethrin, follow the instructions on the label. Put it on: ? Clothing. ? Boots. ? Supplies or outdoor gear. ? Tents. When you are outside  Wear long sleeves and long pants.  Wear light-colored clothes.  Tuck your pant legs into your socks.  Stay in the middle of the trail. Do not touch the bushes.  Avoid walking through long grass.  Check for ticks on your clothes, hair, and skin often while you are outside. Before going inside your house, check your clothes, skin, head, neck, armpits, waist, groin, and joint areas. When you go indoors  Check your clothes for ticks. Dry your clothes in a dryer on high heat for 10 minutes or more. If clothes are damp, additional time may be needed.  Wash your clothes right away if they need to be washed. Use hot water.  Check your pets and outdoor gear.  Shower right away.  Check your body for ticks. Do a full body check using a mirror. What is the right way to remove a tick? Remove the tick from your skin as soon as possible. Do not remove the tick with your bare fingers.  To remove a tick that is crawling on your skin: ? Go outdoors and brush the tick off. ? Use tape or a lint roller.  To remove a tick that is biting: 1. Wash your hands. 2. If you have latex gloves, put them on. 3. Use tweezers, curved forceps, or a tick-removal tool to grasp the tick. Grasp the tick  as close to your skin and as close to the tick's head as possible. 4. Gently pull up until the tick lets go.  Try to keep the tick's head attached to its body.  Do not twist or jerk the tick.  Do not squeeze or crush the tick. Do not try to remove a tick with heat, alcohol, petroleum jelly, or fingernail polish.   What should I do after taking out a tick?  Throw away the tick. Do not crush a tick with your fingers.  Clean the bite area and your hands with soap and water, rubbing alcohol, or an iodine wash.  If an antiseptic cream or ointment is available, apply a small amount to the bite area.  Wash and disinfect any instruments that you used to remove the tick. How should I get rid of a live tick? To dispose of a live tick, use one of these methods:  Place the tick in rubbing alcohol.  Place the tick in a bag or container you can close tightly.  Wrap the tick tightly in tape.  Flush the tick down the toilet. Contact a doctor if:  You have symptoms, such as: ? A fever or chills. ? A red rash that makes a circle (bull's-eye rash) in the bite area. ? Redness and swelling where the tick bit you. ? Headache. ?  Pain in a muscle, joint, or bone. ? Being more tired than normal. ? Trouble walking or moving your legs. ? Numbness in your legs. ? Tender and swollen lymph glands.  A part of a tick breaks off and gets stuck in your skin. Get help right away if:  You cannot remove a tick.  You cannot move (have paralysis) or feel weak.  You are feeling worse or have new symptoms.  You find a tick that is biting you and filled with blood. This is important if you are in an area where diseases from ticks are common. Summary  Ticks may carry germs that can make you sick.  To prevent tick bites wear long sleeves, long pants, and light colors. Use insect repellent. Follow the instructions on the label.  If the tick is biting, do not try to remove it with heat, alcohol, petroleum  jelly, or fingernail polish.  Use tweezers, curved forceps, or a tick-removal tool to grasp the tick. Gently pull up until the tick lets go. Do not twist or jerk the tick. Do not squeeze or crush the tick.  If you have symptoms, contact a doctor. This information is not intended to replace advice given to you by your health care provider. Make sure you discuss any questions you have with your health care provider. Document Revised: 12/05/2019 Document Reviewed: 12/05/2019 Elsevier Patient Education  2021 [employer].

## 2021-05-06 NOTE — Progress Notes (Addendum)
Acute Office Visit  Subjective:    Patient ID: Frances [name], female    DOB: 08-15-1976, 45 y.o.   MRN: [id number]  Chief Complaint  Patient presents with  . Rash    HPI Patient is in today for rash on left calf. She first noticed the rash yesterday and went to urgent care and was given a Medrol dose pack. She removed a tick from her calf sometime in the last couple weeks, but she does not recall exactly when it was. She is unsure how long the tick was attached. She is outside frequently and has a new dog staying with her. She denies any new environmental exposures, skin care products, or laundry detergent. The rash does not hurt or itch.    Past Medical History:  Diagnosis Date  . Anxiety   . Arthritis   . Back pain, chronic   . Bipolar 1 disorder ([place])   . Chronic fatigue syndrome   . DDD (degenerative disc disease), lumbar   . Depression   . Diabetes mellitus without complication ([place])   . Fibromyalgia   . IUD 2012   Mirena  . MTHFR gene mutation     Past Surgical History:  Procedure Laterality Date  . COLONOSCOPY WITH PROPOFOL N/A 02/25/2017   Procedure: COLONOSCOPY WITH PROPOFOL;  Surgeon: Arta Silence, MD;  Location: [facility];  Service: Endoscopy;  Laterality: N/A;  . left knee lateral release  1993  . scar tisue removal  03/2005   left ankle  . TONSILLECTOMY  age 69's    Family History  Problem Relation Age of Onset  . Diabetes Mother   . Stroke Mother   . Lupus Mother   . Hypertension Mother   . Congestive Heart Failure Mother   . Mental illness Brother        Not clear what his diagnosis is--may be related to previous drug use.  . Cancer Maternal Grandmother        colon  . Depression Maternal Uncle   . Alcohol abuse Maternal Uncle   . Diabetes Maternal Grandfather     Social History   Socioeconomic History  . Marital status: Single    Spouse name: Not on file  . Number of children: 0  . Years of education: Not on file  . Highest  education level: Bachelor's degree (e.g., BA, AB, BS)  Occupational History  . Occupation: Designer, industrial/product at times  Tobacco Use  . Smoking status: Never Smoker  . Smokeless tobacco: Never Used  Vaping Use  . Vaping Use: Never used  Substance and Sexual Activity  . Alcohol use: No    Alcohol/week: 0.0 standard drinks  . Drug use: No  . Sexual activity: Yes    Birth control/protection: I.U.D.  Other Topics Concern  . Not on file  Social History Narrative   Originally from [place], outside of [place].    Moved here permanently 2012.   Intermittently worked on a farm.   Lives with many cats--3 plus fosters cats.    Single, lives alone in a one story home. Rarely drinks caffeine. 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   Social Determinants of Health   Financial Resource Strain: Medium Risk  . Difficulty of Paying Living Expenses: Somewhat hard  Food Insecurity: No Food Insecurity  . Worried About Charity fundraiser in the Last Year: Never true  . Ran Out of Food in the Last Year: Never true  Transportation Needs: No  Transportation Needs  . Lack of Transportation (Medical): No  . Lack of Transportation (Non-Medical): No  Physical Activity: Inactive  . Days of Exercise per Week: 0 days  . Minutes of Exercise per Session: 0 min  Stress: No Stress Concern Present  . Feeling of Stress : Not at all  Social Connections: Moderately Isolated  . Frequency of Communication with Friends and Family: More than three times a week  . Frequency of Social Gatherings with Friends and Family: More than three times a week  . Attends Religious Services: Never  . Active Member of Clubs or Organizations: Yes  . Attends Archivist Meetings: More than 4 times per year  . Marital Status: Never married  Intimate Partner Violence: Not At Risk  . Fear of Current or Ex-Partner: No  . Emotionally Abused: No  . Physically Abused: No  . Sexually Abused: No     Outpatient Medications Prior to Visit  Medication Sig Dispense Refill  . ARIPiprazole (ABILIFY) 15 MG tablet Take 1 tablet (15 mg total) by mouth daily. 30 tablet 2  . atorvastatin (LIPITOR) 10 MG tablet Take 1 tablet (10 mg total) by mouth daily. 90 tablet 1  . Azelastine HCl 137 MCG/SPRAY SOLN PLACE 2 APPLICATORS INTO ALTERNATE NOSTRILS TWICE A DAY 30 mL 2  . benztropine (COGENTIN) 0.5 MG tablet Take 1 tablet (0.5 mg total) by mouth daily. 30 tablet 2  . buPROPion (WELLBUTRIN XL) 150 MG 24 hr tablet Take 1 tablet (150 mg total) by mouth every morning. 30 tablet 2  . buPROPion (WELLBUTRIN XL) 300 MG 24 hr tablet Take 1 tablet (300 mg total) by mouth daily. 30 tablet 2  . cholecalciferol (VITAMIN D3) 25 MCG (1000 UT) tablet Take 1,000 Units by mouth daily.    . DULoxetine (CYMBALTA) 60 MG capsule Take 60 mg by mouth daily.    . famotidine (PEPCID) 20 MG tablet Take 20 mg by mouth daily.    . fluticasone (FLONASE) 50 MCG/ACT nasal spray USE 2 SPRAYS IN EACH NOSTRIL EVERY DAY 16 g 1  . gabapentin (NEURONTIN) 800 MG tablet Take 1 tablet (800 mg total) by mouth 3 (three) times daily. 90 tablet 3  . hydrochlorothiazide (HYDRODIURIL) 12.5 MG tablet Take 1 tablet (12.5 mg total) by mouth daily. 30 tablet 2  . hydroxychloroquine (PLAQUENIL) 200 MG tablet Take 200 mg by mouth in the morning and at bedtime.    [name] levocetirizine (XYZAL) 5 MG tablet TAKE 1 TABLET BY MOUTH EVERY EVENING 90 tablet 1  . lisinopril (ZESTRIL) 5 MG tablet Take 1 tablet (5 mg total) by mouth daily. 90 tablet 1  . meloxicam (MOBIC) 15 MG tablet Take 1 tablet by mouth daily.    . metFORMIN (GLUCOPHAGE) 1000 MG tablet Take 1 tablet (1,000 mg total) by mouth 2 (two) times daily with a meal. 180 tablet 1  . methylPREDNISolone (MEDROL DOSEPAK) 4 MG TBPK tablet Take as directed 1 each 0  . montelukast (SINGULAIR) 10 MG tablet TAKE 1 Tablet BY MOUTH ONCE EVERY NIGHT AT BEDTIME 90 tablet 1  . pantoprazole (PROTONIX) 40 MG tablet TAKE  1 TABLET BY MOUTH DAILY 90 tablet 1  . traZODone (DESYREL) 50 MG tablet Take 1 tablet (50 mg total) by mouth at bedtime as needed for sleep. 60 tablet 2  . HYDROcodone-acetaminophen (NORCO/VICODIN) 5-325 MG tablet Take 1 tablet by mouth 2 (two) times daily as needed for moderate pain (back pain). (Patient not taking: No sig reported) 60 tablet 0  .  methocarbamol (ROBAXIN) 500 MG tablet Take 1 tablet (500 mg total) by mouth 3 (three) times daily. (Patient not taking: No sig reported) 90 tablet 1  . topiramate (TOPAMAX) 25 MG tablet Take one tablet at bedtime. (Patient not taking: Reported on 04/18/2021) 30 tablet 1   No facility-administered medications prior to visit.    Allergies  Allergen Reactions  . Metaxalone Hives and Other (See Comments)    Review of Systems  Constitutional: Positive for fatigue. Negative for chills and fever.  HENT: Negative for congestion.   Gastrointestinal: Negative for abdominal pain and nausea.  Musculoskeletal: Positive for arthralgias (Chronic RA). Negative for joint swelling and myalgias.  Skin: Positive for rash. Negative for color change.  Allergic/Immunologic: Positive for environmental allergies.       Objective:    Physical Exam Constitutional:      Appearance: Normal appearance.  Cardiovascular:     Pulses: Normal pulses.     Heart sounds: Normal heart sounds.  Pulmonary:     Effort: Pulmonary effort is normal.     Breath sounds: Normal breath sounds.  Musculoskeletal:        General: No swelling or tenderness.  Skin:    Findings: Erythema and rash present.  Neurological:     Mental Status: She is alert.     BP 117/77   Pulse 86   Temp 98.7 F (37.1 C) (Oral)   Ht 5\' 7"  (1.702 m)   Wt 189 lb (85.7 kg)   LMP 05/02/2021 (Exact Date)   SpO2 99%   BMI 29.60 kg/m  Wt Readings from Last 3 Encounters:  05/06/21 189 lb (85.7 kg)  04/18/21 196 lb (88.9 kg)  04/18/21 208 lb (94.3 kg)    Health Maintenance Due  Topic Date Due  .  FOOT EXAM  Never done  . OPHTHALMOLOGY EXAM  Never done  . HIV Screening  Never done  . Hepatitis C Screening  Never done  . COVID-19 Vaccine (3 - Booster for Moderna series) 03/30/2021  . PAP SMEAR-Modifier  04/08/2021    There are no preventive care reminders to display for this patient.   Lab Results  Component Value Date   TSH 0.866 11/21/2020   Lab Results  Component Value Date   WBC 6.6 03/08/2021   HGB 13.9 03/08/2021   HCT 40 03/08/2021   MCV 93 11/21/2020   PLT 252 03/08/2021   Lab Results  Component Value Date   NA 135 (A) 03/08/2021   K 4.2 03/08/2021   CO2 26 (A) 03/08/2021   GLUCOSE 130 (H) 11/21/2020   BUN 12 03/08/2021   CREATININE 0.7 03/08/2021   BILITOT 0.6 11/21/2020   ALKPHOS 120 03/08/2021   AST 37 (A) 03/08/2021   ALT 58 (A) 03/08/2021   PROT 6.8 11/21/2020   ALBUMIN 3.6 03/08/2021   CALCIUM 9.8 03/08/2021   ANIONGAP 7 01/24/2018   Lab Results  Component Value Date   CHOL 130 04/14/2018   Lab Results  Component Value Date   HDL 28 (L) 04/14/2018   Lab Results  Component Value Date   LDLCALC 73 04/14/2018   Lab Results  Component Value Date   TRIG 192 (H) 04/14/2018   Lab Results  Component Value Date   CHOLHDL 4.6 04/14/2018   Lab Results  Component Value Date   HGBA1C 7.0 03/08/2021       Assessment & Plan:  Marland KitchenMarland KitchenAzhia was seen today for rash.  Diagnoses and all orders for this visit:  Tick  bite of left lower leg, subsequent encounter -     doxycycline (VIBRA-TABS) 100 MG tablet; Take 1 tablet (100 mg total) by mouth 2 (two) times daily.  Rash and nonspecific skin eruption -     doxycycline (VIBRA-TABS) 100 MG tablet; Take 1 tablet (100 mg total) by mouth 2 (two) times daily. -     ketoconazole (NIZORAL) 2 % cream; Apply 1 application topically 2 (two) times daily. To affected areas.   Rash is target like lesion with central clearing  She reports known tick bite and she has 2 family members with lymes.  She has  also worn tight workout pants in the last week in warm weather and could be a fungal infection  Doxycycline given for potential lyme disease  Ketoconazole cream given for potential fungal infection . She has contact with many animals.   Marland KitchenVernetta Honey PA-C, have reviewed and agree with the above documentation in it's entirety.

## 2021-05-07 ENCOUNTER — Encounter: Payer: Self-pay | Admitting: Physician Assistant

## 2021-05-07 DIAGNOSIS — S80862D Insect bite (nonvenomous), left lower leg, subsequent encounter: Secondary | ICD-10-CM

## 2021-05-07 DIAGNOSIS — W57XXXD Bitten or stung by nonvenomous insect and other nonvenomous arthropods, subsequent encounter: Secondary | ICD-10-CM

## 2021-05-09 ENCOUNTER — Ambulatory Visit (INDEPENDENT_AMBULATORY_CARE_PROVIDER_SITE_OTHER): Payer: No Payment, Other | Admitting: Licensed Clinical Social Worker

## 2021-05-09 ENCOUNTER — Other Ambulatory Visit: Payer: Self-pay

## 2021-05-09 DIAGNOSIS — F319 Bipolar disorder, unspecified: Secondary | ICD-10-CM

## 2021-05-09 DIAGNOSIS — F603 Borderline personality disorder: Secondary | ICD-10-CM

## 2021-05-09 DIAGNOSIS — F411 Generalized anxiety disorder: Secondary | ICD-10-CM

## 2021-05-09 DIAGNOSIS — F3341 Major depressive disorder, recurrent, in partial remission: Secondary | ICD-10-CM

## 2021-05-09 NOTE — Progress Notes (Signed)
   THERAPIST PROGRESS NOTE  Session Time: 77   Participation Level: Active  Behavioral Response: Casual and Fairly GroomedAlertAnxious  Type of Therapy: Individual Therapy  Treatment Goals addressed: Anxiety and Diagnosis: bipolar depresssion   Interventions: CBT and Supportive  Summary: Frances [name] is a 45 y.o. female who presents with bipolar depression.   Suicidal/Homicidal: NAwithout intent/plan  Virtual Visit via Video Note  I connected with Frances [name] on 05/09/21 at  2:00 PM EDT by a video enabled telemedicine application and verified that I am speaking with the correct person using two identifiers.  Location: Patient: [facility]  Provider: Home    I discussed the limitations of evaluation and management by telemedicine and the availability of in person appointments. The patient expressed understanding and agreed to proceed.   Therapist Response:     Subjective/Objective:  Pt was alert and oriented x 5. She was dressed casually and engaged well in therapy session. Pt presented with flat and depressed mood/affect. She was cooperative and maintained good eye contact.   Pt primary stressor has been financials. Pt has been stressed with helping her friends. She reports that her friends have not had access to a bank as it is [place] of state. Frances [name] attempted to cash checks for them, and they all bounced. This caused fines and penalties for pt. Frances [name]'s friends state they will pay her back in the next few days, but it has caused stress and tension.   Pt did bring in required documents to [facility] to prove she was a [facility] resident. Plan will be to switch therapist next month on  6/28. She is agreeable to that plan as pt has been unable to make progress with this therapist not meeting goals and objectives for journaling, walking, and attempting new hobbies.   Assessment/Plan: Pt endorses symptoms for ratability, tension, worry, fatigue,  isolation, lack of motivation, and insomnia. She has Hx of bipolar disorder, BPD, MDD, and GAD. She is taking her medications as prescribed. Plan listed above    I discussed the assessment and treatment plan with the patient. The patient was provided an opportunity to ask questions and all were answered. The patient agreed with the plan and demonstrated an understanding of the instructions.   The patient was advised to call back or seek an in-person evaluation if the symptoms worsen or if the condition fails to improve as anticipated.  I provided 45  minutes of non-face-to-face time during this encounter.   [name], LCSW   Plan: Return again in 5   weeks.     [name], LCSW 05/09/2021

## 2021-05-10 ENCOUNTER — Telehealth: Payer: Self-pay

## 2021-05-10 DIAGNOSIS — R21 Rash and other nonspecific skin eruption: Secondary | ICD-10-CM

## 2021-05-10 DIAGNOSIS — S80862D Insect bite (nonvenomous), left lower leg, subsequent encounter: Secondary | ICD-10-CM

## 2021-05-10 DIAGNOSIS — W57XXXD Bitten or stung by nonvenomous insect and other nonvenomous arthropods, subsequent encounter: Secondary | ICD-10-CM

## 2021-05-10 NOTE — Telephone Encounter (Signed)
Ordered at [facility]. Patient made aware.

## 2021-05-10 NOTE — Telephone Encounter (Signed)
Ok to reprint for [facility].

## 2021-05-10 NOTE — Telephone Encounter (Signed)
Patient stopped by to ask If we could seen her labs to [facility] at [address] they don't charge her anything. [facility] downstairs charges her.

## 2021-05-14 LAB — LYME DISEASE SEROLOGY W/REFLEX: Lyme Total Antibody EIA: NEGATIVE

## 2021-05-14 NOTE — Telephone Encounter (Signed)
No antibodies to lyme's disease. It has been 14 days since the tick was pulled off so this is accurate. If you are concerned we could check one more time in 1 more week to make sure antibodies did not show up

## 2021-05-21 ENCOUNTER — Other Ambulatory Visit: Payer: Self-pay | Admitting: Sports Medicine

## 2021-05-21 ENCOUNTER — Other Ambulatory Visit: Payer: Self-pay | Admitting: Physician Assistant

## 2021-05-21 DIAGNOSIS — M5136 Other intervertebral disc degeneration, lumbar region: Secondary | ICD-10-CM

## 2021-05-21 MED FILL — HYDROXYCHLOROQUINE 200 MG TABLET: ORAL | 30 days supply | Qty: 60 | Fill #2

## 2021-05-21 MED FILL — CYMBALTA 30 MG CAPSULE,DELAYED RELEASE: ORAL | 30 days supply | Qty: 60 | Fill #2

## 2021-05-23 ENCOUNTER — Ambulatory Visit (INDEPENDENT_AMBULATORY_CARE_PROVIDER_SITE_OTHER): Payer: No Typology Code available for payment source | Admitting: Specialist

## 2021-05-23 ENCOUNTER — Other Ambulatory Visit: Payer: Self-pay

## 2021-05-23 ENCOUNTER — Encounter: Payer: Self-pay | Admitting: *Deleted

## 2021-05-23 ENCOUNTER — Encounter: Payer: Self-pay | Admitting: Specialist

## 2021-05-23 VITALS — BP 106/72 | HR 97 | Ht 67.0 in | Wt 189.0 lb

## 2021-05-23 DIAGNOSIS — M5136 Other intervertebral disc degeneration, lumbar region: Secondary | ICD-10-CM

## 2021-05-23 DIAGNOSIS — M25552 Pain in left hip: Secondary | ICD-10-CM

## 2021-05-23 DIAGNOSIS — M5126 Other intervertebral disc displacement, lumbar region: Secondary | ICD-10-CM

## 2021-05-23 DIAGNOSIS — M48062 Spinal stenosis, lumbar region with neurogenic claudication: Secondary | ICD-10-CM

## 2021-05-23 NOTE — Progress Notes (Signed)
Office Visit Note   Patient: Frances [name]           Date of Birth: [birth year]           MRN: [id number] Visit Date: 05/23/2021              Requested by: Donella Stade, PA-C [address] [place],  [place] [address] PCP: Donella Stade, PA-C   Assessment & Plan: Visit Diagnoses:  1. DDD (degenerative disc disease), lumbar   2. Spinal stenosis of lumbar region with neurogenic claudication   3. Pain in left hip   4. Herniation of lumbar intervertebral disc     Plan: Avoid frequent bending and stooping  No lifting greater than 10 lbs. May use ice or moist heat for pain. Weight loss is of benefit. Best medication for lumbar disc disease is arthritis medications like motrin, celebrex and naprosyn. Exercise is important to improve your indurance and does allow people to function better inspite of back pain.  MRI to assess the L5-S1 level for worsening HNP.   Follow-Up Instructions: No follow-ups on file.   Orders:  Orders Placed This Encounter  Procedures  . MR Lumbar Spine w/o contrast   No orders of the defined types were placed in this encounter.     Procedures: No procedures performed   Clinical Data: No additional findings.   Subjective: Chief Complaint  Patient presents with  . Lower Back - Follow-up    45 year old female with pain that is a 4 of 10 and she is taking hydrocodone once or twice a day for pain she has days that she does not need it. Pain is worse with bending stooping and sitting. There is pain into the left leg greater than right int the left lateral thigh. There is numbness and tingling into the left thigh. Underwent a cortisone injection.   Review of Systems   Objective: Vital Signs: BP 106/72 (BP Location: Left Arm, Patient Position: Sitting)   Pulse 97   Ht 5\' 7"  (1.702 m)   Wt 189 lb (85.7 kg)   LMP 05/02/2021 (Exact Date)   BMI 29.60 kg/m   Physical Exam  Ortho Exam  Specialty Comments:  No  specialty comments available.  Imaging: No results found.   PMFS History: Patient Active Problem List   Diagnosis Date Noted  . Attention deficit hyperactivity disorder (ADHD), 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  . Hyperlipidemia LDL goal <70 03/18/2021  . Type 2 diabetes mellitus with hyperglycemia, without long-term current use of insulin ([place]) 03/13/2021  . Seasonal allergies 12/25/2020  . Chest congestion 11/20/2020  . Sinus drainage 11/20/2020  . Bilateral leg edema 11/19/2020  . Costochondritis 10/22/2020  . Rib pain on right side 10/22/2020  . Tachycardia 10/22/2020  . Hepatomegaly 10/22/2020  . Symptomatic mammary hypertrophy 09/04/2020  . Xiphoid pain 08/13/2020  . Sternum pain 08/13/2020  . Class 1 obesity due to excess calories without serious comorbidity with body mass index (BMI) of 32.0 to 32.9 in adult 08/13/2020  . Macromastia 06/29/2020  . Recurrent major depressive disorder, in partial remission ([place]) 05/24/2020  . Generalized anxiety disorder 05/24/2020  . DDD (degenerative disc disease), cervical 03/13/2020  . Ingrowing left great toenail 03/13/2020  . Labral tear of hip, degenerative 01/11/2020  . Grief reaction 11/28/2019  . Toenail fungus 09/09/2019  . Vasovagal response 09/09/2019  . Decreased visual acuity 08/30/2018  . Right corneal abrasion 08/30/2018  . Elevated fasting glucose 04/08/2018  .  Depression   . Anxiety 01/22/2018  . Borderline personality disorder ([place]) 01/22/2018  . Rheumatoid arthritis 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 ([place]) 12/09/2017  . TMJ (temporomandibular joint syndrome) 09/26/2017  . New daily persistent headache 09/26/2017  . MTHFR gene mutation 09/20/2017  . Bipolar I disorder ([place]) 09/16/2017  . Fibromyalgia 09/16/2017  . Chronic fatigue syndrome 09/16/2017  . DYSPNEA 10/15/2011  . Back pain 10/13/2008  . DERMATITIS, ALLERGIC 06/05/2008  . FATIGUE 05/05/2008  . Hypothyroidism 02/10/2008  .  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 DDD (degenerative disc disease), lumbar 02/10/2008  . FIBROMYALGIA 02/10/2008  . IRON, SERUM, ELEVATED 02/10/2008   Past Medical History:  Diagnosis Date  . Anxiety   . Arthritis   . Back pain, chronic   . Bipolar 1 disorder ([place])   . Chronic fatigue syndrome   . DDD (degenerative disc disease), lumbar   . Depression   . Diabetes mellitus without complication ([place])   . Fibromyalgia   . IUD 2012   Mirena  . MTHFR gene mutation     Family History  Problem Relation Age of Onset  . Diabetes Mother   . Stroke Mother   . Lupus Mother   . Hypertension Mother   . Congestive Heart Failure Mother   . Mental illness Brother        Not clear what his diagnosis is--may be related to previous drug use.  . Cancer Maternal Grandmother        colon  . Depression Maternal Uncle   . Alcohol abuse Maternal Uncle   . Diabetes Maternal Grandfather     Past Surgical History:  Procedure Laterality Date  . COLONOSCOPY WITH PROPOFOL N/A 02/25/2017   Procedure: COLONOSCOPY WITH PROPOFOL;  Surgeon: Arta Silence, MD;  Location: [facility];  Service: Endoscopy;  Laterality: N/A;  . left knee lateral release  1993  . scar tisue removal  03/2005   left ankle  . TONSILLECTOMY  age 65's   Social History   Occupational History  . Occupation: Designer, industrial/product at times  Tobacco Use  . Smoking status: Never Smoker  . Smokeless tobacco: Never Used  Vaping Use  . Vaping Use: Never used  Substance and Sexual Activity  . Alcohol use: No    Alcohol/week: 0.0 standard drinks  . Drug use: No  . Sexual activity: Yes    Birth control/protection: I.U.D.

## 2021-05-23 NOTE — Patient Instructions (Signed)
Avoid frequent bending and stooping  No lifting greater than 10 lbs. May use ice or moist heat for pain. Weight loss is of benefit. Best medication for lumbar disc disease is arthritis medications like motrin, celebrex and naprosyn. Exercise is important to improve your indurance and does allow people to function better inspite of back pain.  MRI to assess the L5-S1 level for worsening HNP.

## 2021-05-25 ENCOUNTER — Ambulatory Visit (HOSPITAL_BASED_OUTPATIENT_CLINIC_OR_DEPARTMENT_OTHER)
Admission: RE | Admit: 2021-05-25 | Discharge: 2021-05-25 | Disposition: A | Discharge status: Home / Self Care | Payer: Self-pay | Source: Ambulatory Visit | Attending: Specialist | Admitting: Specialist

## 2021-05-25 ENCOUNTER — Other Ambulatory Visit: Payer: Self-pay

## 2021-05-25 DIAGNOSIS — M48062 Spinal stenosis, lumbar region with neurogenic claudication: Secondary | ICD-10-CM | POA: Insufficient documentation

## 2021-05-25 DIAGNOSIS — M25552 Pain in left hip: Secondary | ICD-10-CM | POA: Insufficient documentation

## 2021-05-29 ENCOUNTER — Ambulatory Visit: Payer: Self-pay | Admitting: Pharmacist

## 2021-05-29 DIAGNOSIS — E785 Hyperlipidemia, unspecified: Secondary | ICD-10-CM

## 2021-05-29 DIAGNOSIS — E1165 Type 2 diabetes mellitus with hyperglycemia: Secondary | ICD-10-CM

## 2021-05-29 MED ORDER — BLOOD GLUCOSE MONITOR KIT: PACK | 0 refills | Status: AC

## 2021-05-29 MED ORDER — OZEMPIC (0.25 OR 0.5 MG/DOSE) 2 MG/1.5ML ~~LOC~~ SOPN: 0.2500 mg | PEN_INJECTOR | SUBCUTANEOUS | 0 refills | Status: DC

## 2021-05-29 NOTE — Progress Notes (Addendum)
Care Management   Pharmacy Note  05/30/2021 Name: Frances [name] MRN: [id number] DOB: April 11, 1976  Subjective: Frances [name] is a 45 y.o. year old female who is a primary care patient of Donella Stade, PA-C. The Care Management team was consulted for assistance with care management and care coordination needs.    Engaged with patient face to face for initial visit in response to provider referral for pharmacy case management and/or care coordination services.   The patient was given information about Care Management services today including:  Care Management services includes personalized support from designated clinical staff supervised by the patient's primary care provider, including individualized plan of care and coordination with other care providers. 24/7 contact phone numbers for assistance for urgent and routine care needs. The patient may stop case management services at any time by phone call to the office staff.  Patient agreed to services and consent obtained.  Assessment:  Review of patient status, including review of consultants reports, laboratory and other test data, was performed as part of comprehensive evaluation and provision of chronic care management services.   SDOH (Social Determinants of Health) assessments and interventions performed:    Objective:  Lab Results  Component Value Date   CREATININE 0.7 03/08/2021   CREATININE 0.8 02/20/2021   CREATININE 0.89 11/21/2020    Lab Results  Component Value Date   HGBA1C 7.0 03/08/2021       Component Value Date/Time   CHOL 130 04/14/2018 0822   CHOL 152 03/04/2016 1015   TRIG 192 (H) 04/14/2018 0822   HDL 28 (L) 04/14/2018 0822   HDL 37 (L) 03/04/2016 1015   CHOLHDL 4.6 04/14/2018 0822   LDLCALC 73 04/14/2018 0822    BP Readings from Last 3 Encounters:  05/23/21 106/72  05/06/21 117/77  05/05/21 107/75    Care Plan  Allergies  Allergen Reactions   Metaxalone Hives and Other (See  Comments)    Medications Reviewed Today     Reviewed by Darius Bump, [place] (Pharmacist) on 05/29/21 at 80  Med List Status: <None>   Medication Order Taking? Sig Documenting Provider Last Dose Status Informant  ARIPiprazole (ABILIFY) 15 MG tablet [id number] Yes Take 1 tablet (15 mg total) by mouth daily. Salley Slaughter, NP Taking Active   atorvastatin (LIPITOR) 10 MG tablet [id number] Yes Take 1 tablet (10 mg total) by mouth daily. Donella Stade, PA-C Taking Active   Azelastine HCl 137 MCG/SPRAY SOLN [id number] Yes PLACE 2 APPLICATORS INTO ALTERNATE NOSTRILS TWICE A DAY Breeback, Jade L, PA-C Taking Active   benztropine (COGENTIN) 0.5 MG tablet [id number] Yes Take 1 tablet (0.5 mg total) by mouth daily. Salley Slaughter, NP Taking Active   buPROPion (WELLBUTRIN XL) 150 MG 24 hr tablet [id number] Yes Take 1 tablet (150 mg total) by mouth every morning. Salley Slaughter, NP Taking Active   buPROPion (WELLBUTRIN XL) 300 MG 24 hr tablet [id number] Yes Take 1 tablet (300 mg total) by mouth daily. Salley Slaughter, NP Taking Active   cholecalciferol (VITAMIN D3) 25 MCG (1000 UT) tablet [id number] Yes Take 1,000 Units by mouth daily. [provider] Taking Active   DULoxetine (CYMBALTA) 60 MG capsule [id number] Yes Take 60 mg by mouth daily. [provider] Taking Active   famotidine (PEPCID) 20 MG tablet [id number] Yes Take 20 mg by mouth daily. [provider] Taking Active   fluticasone (FLONASE) 50 MCG/ACT nasal spray [id number] Yes USE 2 SPRAYS IN EACH NOSTRIL EVERY  DAY Breeback, Jade L, PA-C Taking Active   gabapentin (NEURONTIN) 800 MG tablet [id number] Yes TAKE 1 TABLET (800 MG TOTAL) BY MOUTH 3 (THREE) TIMES DAILY. Silverio Decamp, MD Taking Active   hydrochlorothiazide (HYDRODIURIL) 12.5 MG tablet [id number] No Take 1 tablet (12.5 mg total) by mouth daily.  Patient not taking: Reported on 05/29/2021   Donella Stade, PA-C Not Taking Consider  Medication Status and Discontinue   HYDROcodone-acetaminophen (NORCO/VICODIN) 5-325 MG tablet [id number] Yes Take 1 tablet by mouth 2 (two) times daily as needed for moderate pain (back pain). Silverio Decamp, MD Taking Active   hydroxychloroquine (PLAQUENIL) 200 MG tablet [id number] Yes Take 200 mg by mouth in the morning and at bedtime. [provider] Taking Active   levocetirizine (XYZAL) 5 MG tablet [id number] Yes TAKE 1 TABLET BY MOUTH EVERY EVENING Breeback, Jade L, PA-C Taking Active   lisinopril (ZESTRIL) 5 MG tablet [id number] Yes Take 1 tablet (5 mg total) by mouth daily. Donella Stade, PA-C Taking Active   meloxicam (MOBIC) 15 MG tablet [id number] No Take 1 tablet by mouth daily.  Patient not taking: Reported on 05/29/2021   [provider] Not Taking Active   metFORMIN (GLUCOPHAGE) 1000 MG tablet [id number] Yes Take 1 tablet (1,000 mg total) by mouth 2 (two) times daily with a meal. Breeback, Jade L, PA-C Taking Active   methocarbamol (ROBAXIN) 500 MG tablet [id number] Yes Take 1 tablet (500 mg total) by mouth 3 (three) times daily. Donella Stade, PA-C Taking Active   montelukast (SINGULAIR) 10 MG tablet [id number] Yes TAKE 1 Tablet BY MOUTH ONCE EVERY NIGHT AT BEDTIME Donella Stade, PA-C Taking Active   pantoprazole (PROTONIX) 40 MG tablet [id number] Yes TAKE 1 TABLET BY MOUTH DAILY Breeback, Jade L, PA-C Taking Active   traZODone (DESYREL) 50 MG tablet [id number] Yes Take 1 tablet (50 mg total) by mouth at bedtime as needed for sleep. Salley Slaughter, NP Taking Active   Med List Note [name], CPhT 02/20/17 1343): Pt uses [facility] med assist             Patient Active Problem List   Diagnosis Date Noted   Attention deficit hyperactivity disorder (ADHD), 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   Hyperlipidemia LDL goal <70 03/18/2021   Type 2 diabetes mellitus with hyperglycemia, without long-term current use of insulin ([place]) 03/13/2021   Seasonal  allergies 12/25/2020   Chest congestion 11/20/2020   Sinus drainage 11/20/2020   Bilateral leg edema 11/19/2020   Costochondritis 10/22/2020   Rib pain on right side 10/22/2020   Tachycardia 10/22/2020   Hepatomegaly 10/22/2020   Symptomatic mammary hypertrophy 09/04/2020   Xiphoid pain 08/13/2020   Sternum pain 08/13/2020   Class 1 obesity due to excess calories without serious comorbidity with body mass index (BMI) of 32.0 to 32.9 in adult 08/13/2020   Macromastia 06/29/2020   Recurrent major depressive disorder, in partial remission ([place]) 05/24/2020   Generalized anxiety disorder 05/24/2020   DDD (degenerative disc disease), cervical 03/13/2020   Ingrowing left great toenail 03/13/2020   Labral tear of hip, degenerative 01/11/2020   Grief reaction 11/28/2019   Toenail fungus 09/09/2019   Vasovagal response 09/09/2019   Decreased visual acuity 08/30/2018   Right corneal abrasion 08/30/2018   Elevated fasting glucose 04/08/2018   Depression    Anxiety 01/22/2018   Borderline personality disorder ([place]) 01/22/2018   Rheumatoid arthritis 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 ([place]) 12/09/2017   TMJ (temporomandibular joint  syndrome) 09/26/2017   New daily persistent headache 09/26/2017   MTHFR gene mutation 09/20/2017   Bipolar I disorder ([place]) 09/16/2017   Fibromyalgia 09/16/2017   Chronic fatigue syndrome 09/16/2017   DYSPNEA 10/15/2011   Back pain 10/13/2008   DERMATITIS, ALLERGIC 06/05/2008   FATIGUE 05/05/2008   Hypothyroidism 02/10/2008   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   DDD (degenerative disc disease), lumbar 02/10/2008   FIBROMYALGIA 02/10/2008   IRON, SERUM, ELEVATED 02/10/2008    Conditions to be addressed/monitored: HTN, HLD and DMII  Care Plan : Medication Management  Updates made by Darius Bump, [place] since 05/30/2021 12:00 AM     Problem: DM, HTN, HLD      Long-Range Goal: Disease Progression Prevention   Start Date: 05/30/2021  This  Visit's Progress: On track  Priority: High  Note:   Current Barriers:  Unable to self administer medications as prescribed  Pharmacist Clinical Goal(s):  Over the next 90 days, patient will achieve ability to self administer medications as prescribed through use of written materials + demonstration/counseling with pharmacist as evidenced by patient report through collaboration with PharmD and provider.   Interventions: 1:1 collaboration with Donella Stade, PA-C regarding development and update of comprehensive plan of care as evidenced by provider attestation and co-signature Inter-disciplinary care team collaboration (see longitudinal plan of care) Comprehensive medication review performed; medication list updated in electronic medical record  Diabetes:  Uncontrolled:  current treatment:Metformin 1g BID;   Current glucose readings: not currently checking  Denies hypoglycemic/hyperglycemic symptoms  Current meal patterns: to discuss at next visit  Current exercise: to discuss at next visit  Educated on new start ozempic, how to administer, storage for pens, needle/sharps disposal, and expected side effects as well as dose titratione Recommended initiate new ozempic, 1st dose administered at this visit,  Hypertension:  Controlled; current treatment:lisinopril 5mg ;   Current home readings: not currently checking, BP 103/70 at today's visit  Denies hypotensive/hypertensive symptoms  Recommended continue current regimen and  Hyperlipidemia:  Controlled; current treatment:atorvastatin 10mg ;   Recommended continue current regimen, obtain fasting lipid panel & CMP at next visit   Patient Goals/Self-Care Activities Over the next 90 days, patient will:  take medications as prescribed and check glucose in AM prior to waking, and 2hr after a meal, document, and provide at future appointments  Follow Up Plan: Face to Face appointment with care management team member scheduled for: 3  months     Medication Assistance:   Ozempic obtained through [employer] medication assistance program.  Enrollment ends 2022  Follow Up:  Patient agrees to Care Plan and Follow-up.  Plan: Face to Face appointment with care management team member scheduled for: 3 months  Darius Bump

## 2021-06-06 ENCOUNTER — Encounter: Payer: Self-pay | Admitting: Physician Assistant

## 2021-06-07 ENCOUNTER — Other Ambulatory Visit: Payer: Self-pay | Admitting: Physician Assistant

## 2021-06-07 NOTE — Patient Instructions (Signed)
Goal Blood Sugars: - first thing in AM: <130 - ~2hr after a meal: <180 - You can eat throughout the day, aim for smaller but more frequent meals in order to maintain consistent blood sugar, as well as avoid any nausea from an empty stomach - Ideas for food are listed below!  Visit Information   PATIENT GOALS:   Goals Addressed             This Visit's Progress    Medication Management       Patient Goals/Self-Care Activities Over the next 90 days, patient will:  take medications as prescribed and check glucose in AM prior to waking, and 2hr after a meal, document, and provide at future appointments  Follow Up Plan: Face to Face appointment with care management team member scheduled for: 3 months         Consent to CCM Services: Ms. Maudlin was given information about Chronic Care Management services today including:  CCM service includes personalized support from designated clinical staff supervised by her physician, including individualized plan of care and coordination with other care providers 24/7 contact phone numbers for assistance for urgent and routine care needs. Service will only be billed when office clinical staff spend 20 minutes or more in a month to coordinate care. Only one practitioner may furnish and bill the service in a calendar month. The patient may stop CCM services at any time (effective at the end of the month) by phone call to the office staff. The patient will be responsible for cost sharing (co-pay) of up to 20% of the service fee (after annual deductible is met).  Patient agreed to services and verbal consent obtained.   Patient verbalizes understanding of instructions provided today and agrees to view in [place].   Face to Face appointment with care management team member scheduled for:  3 months  [place] CARE PLAN: Patient Care Plan: Medication Management     Problem Identified: DM, HTN, HLD      Long-Range Goal:  Disease Progression Prevention   Start Date: 05/30/2021  This Visit's Progress: On track  Priority: High  Note:   Current Barriers:  Unable to self administer medications as prescribed  Pharmacist Clinical Goal(s):  Over the next 90 days, patient will achieve ability to self administer medications as prescribed through use of written materials + demonstration/counseling with pharmacist as evidenced by patient report through collaboration with PharmD and provider.   Interventions: 1:1 collaboration with Donella Stade, PA-C regarding development and update of comprehensive plan of care as evidenced by provider attestation and co-signature Inter-disciplinary care team collaboration (see longitudinal plan of care) Comprehensive medication review performed; medication list updated in electronic medical record  Diabetes:  Uncontrolled:  current treatment:Metformin 1g BID;   Current glucose readings: not currently checking  Denies hypoglycemic/hyperglycemic symptoms  Current meal patterns: to discuss at next visit  Current exercise: to discuss at next visit  Educated on new start ozempic, how to administer, storage for pens, needle/sharps disposal, and expected side effects as well as dose titratione Recommended initiate new ozempic, 1st dose administered at this visit,  Hypertension:  Controlled; current treatment:lisinopril 85m;   Current home readings: not currently checking, BP 103/70 at today's visit  Denies hypotensive/hypertensive symptoms  Recommended continue current regimen and  Hyperlipidemia:  Controlled; current treatment:atorvastatin 160m   Recommended continue current regimen, obtain fasting lipid panel & CMP at next visit   Patient Goals/Self-Care Activities Over the next 90  days, patient will:  take medications as prescribed and check glucose in AM prior to waking, and 2hr after a meal, document, and provide at future appointments  Follow Up Plan: Face to Face  appointment with care management team member scheduled for: 3 months       Diabetes Mellitus and Nutrition, Adult When you have diabetes, or diabetes mellitus, it is very important to have healthy eating habits because your blood sugar (glucose) levels are greatly affected by what you eat and drink. Eating healthy foods in the right amounts, at about the same times every day, can help you: Control your blood glucose. Lower your risk of heart disease. Improve your blood pressure. Reach or maintain a healthy weight. What can affect my meal plan? Every person with diabetes is different, and each person has different needs for a meal plan. Your health care provider may recommend that you work with a dietitian to make a meal plan that is best for you. Your meal plan may vary depending on factors such as: The calories you need. The medicines you take. Your weight. Your blood glucose, blood pressure, and cholesterol levels. Your activity level. Other health conditions you have, such as heart or kidney disease. How do carbohydrates affect me? Carbohydrates, also called carbs, affect your blood glucose level more than any other type of food. Eating carbs naturally raises the amount of glucose in your blood. Carb counting is a method for keeping track of how many carbs you eat. Counting carbs is important to keep your blood glucose at a healthy level,especially if you use insulin or take certain oral diabetes medicines. It is important to know how many carbs you can safely have in each meal. This is different for every person. Your dietitian can help you calculate how manycarbs you should have at each meal and for each snack. How does alcohol affect me? Alcohol can cause a sudden decrease in blood glucose (hypoglycemia), especially if you use insulin or take certain oral diabetes medicines. Hypoglycemia can be a life-threatening condition. Symptoms of hypoglycemia, such as sleepiness, dizziness, and  confusion, are similar to symptoms of having too much alcohol. Do not drink alcohol if: Your health care provider tells you not to drink. You are pregnant, may be pregnant, or are planning to become pregnant. If you drink alcohol: Do not drink on an empty stomach. Limit how much you use to: 0-1 drink a day for women. 0-2 drinks a day for men. Be aware of how much alcohol is in your drink. In the [place] one drink equals one 12 oz bottle of beer (355 mL), one 5 oz glass of wine (148 mL), or one 1 oz glass of hard liquor (44 mL). Keep yourself hydrated with water, diet soda, or unsweetened iced tea. Keep in mind that regular soda, juice, and other mixers may contain a lot of sugar and must be counted as carbs. What are tips for following this plan?  Reading food labels Start by checking the serving size on the "Nutrition Facts" label of packaged foods and drinks. The amount of calories, carbs, fats, and other nutrients listed on the label is based on one serving of the item. Many items contain more than one serving per package. Check the total grams (g) of carbs in one serving. You can calculate the number of servings of carbs in one serving by dividing the total carbs by 15. For example, if a food has 30 g of total carbs per serving, it would  be equal to 2 servings of carbs. Check the number of grams (g) of saturated fats and trans fats in one serving. Choose foods that have a low amount or none of these fats. Check the number of milligrams (mg) of salt (sodium) in one serving. Most people should limit total sodium intake to less than 2,300 mg per day. Always check the nutrition information of foods labeled as "low-fat" or "nonfat." These foods may be higher in added sugar or refined carbs and should be avoided. Talk to your dietitian to identify your daily goals for nutrients listed on the label. Shopping Avoid buying canned, pre-made, or processed foods. These foods tend to be high in fat,  sodium, and added sugar. Shop around the outside edge of the grocery store. This is where you will most often find fresh fruits and vegetables, bulk grains, fresh meats, and fresh dairy. Cooking Use low-heat cooking methods, such as baking, instead of high-heat cooking methods like deep frying. Cook using healthy oils, such as olive, canola, or sunflower oil. Avoid cooking with butter, cream, or high-fat meats. Meal planning Eat meals and snacks regularly, preferably at the same times every day. Avoid going long periods of time without eating. Eat foods that are high in fiber, such as fresh fruits, vegetables, beans, and whole grains. Talk with your dietitian about how many servings of carbs you can eat at each meal. Eat 4-6 oz (112-168 g) of lean protein each day, such as lean meat, chicken, fish, eggs, or tofu. One ounce (oz) of lean protein is equal to: 1 oz (28 g) of meat, chicken, or fish. 1 egg.  cup (62 g) of tofu. Eat some foods each day that contain healthy fats, such as avocado, nuts, seeds, and fish. What foods should I eat? Fruits Berries. Apples. Oranges. Peaches. Apricots. Plums. Grapes. Mango. Papaya.Pomegranate. Kiwi. Cherries. Vegetables Lettuce. Spinach. Leafy greens, including kale, chard, collard greens, and mustard greens. Beets. Cauliflower. Cabbage. Broccoli. Carrots. Green beans.Tomatoes. Peppers. Onions. Cucumbers. Brussels sprouts. Grains Whole grains, such as whole-wheat or whole-grain bread, crackers, tortillas,cereal, and pasta. Unsweetened oatmeal. Quinoa. Brown or wild rice. Meats and other proteins Seafood. Poultry without skin. Lean cuts of poultry and beef. Tofu. Nuts. Seeds. Dairy Low-fat or fat-free dairy products such as milk, yogurt, and cheese. The items listed above may not be a complete list of foods and beverages you can eat. Contact a dietitian for more information. What foods should I avoid? Fruits Fruits canned with syrup. Vegetables Canned  vegetables. Frozen vegetables with butter or cream sauce. Grains Refined white flour and flour products such as bread, pasta, snack foods, andcereals. Avoid all processed foods. Meats and other proteins Fatty cuts of meat. Poultry with skin. Breaded or fried meats. Processed meat.Avoid saturated fats. Dairy Full-fat yogurt, cheese, or milk. Beverages Sweetened drinks, such as soda or iced tea. The items listed above may not be a complete list of foods and beverages you should avoid. Contact a dietitian for more information. Questions to ask a health care provider Do I need to meet with a diabetes educator? Do I need to meet with a dietitian? What number can I call if I have questions? When are the best times to check my blood glucose? Where to find more information: American Diabetes Association: diabetes.org Academy of Nutrition and Dietetics: www.eatright.[employer] of Diabetes and Digestive and Kidney Diseases: [employer] Association of Diabetes Care and Education Specialists: www.diabeteseducator.org Summary It is important to have healthy eating habits because your blood sugar (  glucose) levels are greatly affected by what you eat and drink. A healthy meal plan will help you control your blood glucose and maintain a healthy lifestyle. Your health care provider may recommend that you work with a dietitian to make a meal plan that is best for you. Keep in mind that carbohydrates (carbs) and alcohol have immediate effects on your blood glucose levels. It is important to count carbs and to use alcohol carefully. This information is not intended to replace advice given to you by your health care provider. Make sure you discuss any questions you have with your healthcare provider. Document Revised: 11/15/2019 Document Reviewed: 11/15/2019 Elsevier Patient Education  2021 [employer].

## 2021-06-11 ENCOUNTER — Encounter: Payer: Self-pay | Admitting: Physician Assistant

## 2021-06-11 DIAGNOSIS — E1165 Type 2 diabetes mellitus with hyperglycemia: Secondary | ICD-10-CM

## 2021-06-11 MED ORDER — METFORMIN HCL 1000 MG PO TABS: 1000.0000 mg | ORAL_TABLET | Freq: Two times a day (BID) | ORAL | 1 refills | Status: DC

## 2021-06-13 ENCOUNTER — Ambulatory Visit: Payer: No Typology Code available for payment source | Admitting: Surgery

## 2021-06-17 ENCOUNTER — Encounter: Payer: Self-pay | Admitting: Physician Assistant

## 2021-06-17 ENCOUNTER — Ambulatory Visit (INDEPENDENT_AMBULATORY_CARE_PROVIDER_SITE_OTHER): Payer: Self-pay | Admitting: Physician Assistant

## 2021-06-17 ENCOUNTER — Other Ambulatory Visit: Payer: Self-pay

## 2021-06-17 VITALS — BP 118/81 | HR 110 | Temp 98.2°F | Resp 20 | Ht 67.0 in | Wt 180.0 lb

## 2021-06-17 DIAGNOSIS — F3341 Major depressive disorder, recurrent, in partial remission: Secondary | ICD-10-CM

## 2021-06-17 DIAGNOSIS — E1165 Type 2 diabetes mellitus with hyperglycemia: Secondary | ICD-10-CM

## 2021-06-17 DIAGNOSIS — E663 Overweight: Secondary | ICD-10-CM

## 2021-06-17 DIAGNOSIS — T887XXA Unspecified adverse effect of drug or medicament, initial encounter: Secondary | ICD-10-CM

## 2021-06-17 LAB — POCT GLYCOSYLATED HEMOGLOBIN (HGB A1C): Hemoglobin A1C: 5.6 % (ref 4.0–5.6)

## 2021-06-17 NOTE — Patient Instructions (Signed)
Increase ozempic to .5mg  weekly.

## 2021-06-17 NOTE — Progress Notes (Signed)
Subjective:    Patient ID: Frances [name], female    DOB: 1976-06-16, 45 y.o.   MRN: [id number]  HPI Patient is a 45 year old overweight female with type 2 diabetes, hypothyroidism, chronic fatigue, major depression, anxiety who presents to the clinic for 4-week follow-up after starting Ozempic.  Frances [name] our pharmacist was able to work with patient assistance and get Ozempic approved for patient to help lower sugars and with weight loss.  She is also on metformin daily. Patient is checking her sugars regularly.  Most morning sugars are between 110 and 140.  Most sugars 2 hours after eating are 1 20-1 80.  Patient denies any hypoglycemic events.  Patient seems to be tolerating the medication pretty well.  She is having episodes of diarrhea but they are not lasting for more than a day.  Thinks it could be more what she is eating.  She has lost 9 pounds in 1 month.  She denies any chest pain, palpitations, headaches or vision changes.  She does admit today feeling like she has no motivation.  She feels like her energy is less than and she does not want to get out and do the things she normally does.  Wellbutrin was increased recently by behavioral health.  She has an upcoming follow-up.  She denies any feelings of harm to herself or others. .. 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   DERMATITIS, ALLERGIC 06/05/2008   DDD (degenerative disc disease), lumbar 02/10/2008   Back pain 10/13/2008   FIBROMYALGIA 02/10/2008   FATIGUE 05/05/2008   IRON, SERUM, ELEVATED 02/10/2008   DYSPNEA 10/15/2011   Bipolar I disorder ([place]) 09/16/2017   Fibromyalgia 09/16/2017   Chronic fatigue syndrome 09/16/2017   MTHFR gene mutation 09/20/2017   TMJ (temporomandibular joint syndrome) 09/26/2017   New daily persistent headache 09/26/2017   Rheumatoid arthritis 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 ([place]) 12/09/2017    Anxiety 01/22/2018   Borderline personality disorder ([place]) 01/22/2018   Depression    Elevated fasting glucose 04/08/2018   Decreased visual acuity 08/30/2018   Right corneal abrasion 08/30/2018   Toenail fungus 09/09/2019   Vasovagal response 09/09/2019   Grief reaction 11/28/2019   Labral tear of hip, degenerative 01/11/2020   DDD (degenerative disc disease), cervical 03/13/2020   Ingrowing left great toenail 03/13/2020   Recurrent major depressive disorder, in partial remission ([place]) 05/24/2020   Generalized anxiety disorder 05/24/2020   Macromastia 06/29/2020   Xiphoid pain 08/13/2020   Sternum pain 08/13/2020   Class 1 obesity due to excess calories without serious comorbidity with body mass index (BMI) of 32.0 to 32.9 in adult 08/13/2020   Symptomatic mammary hypertrophy 09/04/2020   Costochondritis 10/22/2020   Rib pain on right side 10/22/2020   Tachycardia 10/22/2020   Hepatomegaly 10/22/2020   Bilateral leg edema 11/19/2020   Chest congestion 11/20/2020   Sinus drainage 11/20/2020   Seasonal allergies 12/25/2020   Type 2 diabetes mellitus with hyperglycemia, without long-term current use of insulin ([place]) 03/13/2021   Hyperlipidemia LDL goal <70 03/18/2021   Attention deficit hyperactivity disorder (ADHD), 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   Overweight (BMI 25.0-29.9) 06/18/2021   Resolved Ambulatory Problems    Diagnosis Date Noted   FEVER, RECURRENT 04/02/2009   SINUSITIS- ACUTE-NOS 03/24/2008   URI 01/17/2009   ANKLE PAIN, BILATERAL 07/28/2008   OPEN WOUND FT NO TOE ALONE WITHOUT MENTION COMP 03/28/2009   Bipolar 1 disorder,  depressed, severe ([place]) 01/25/2018   Chronic right-sided low back pain without sciatica 01/11/2020   Past Medical History:  Diagnosis Date   Arthritis    Back pain, chronic    Bipolar 1 disorder ([place])    Diabetes mellitus without complication ([place])    IUD 2012    Review of Systems  All other systems reviewed and are  negative.     Objective:   Physical Exam Vitals reviewed.  Constitutional:      Appearance: Normal appearance. She is obese.  HENT:     Head: Normocephalic.  Cardiovascular:     Rate and Rhythm: Normal rate and regular rhythm.  Pulmonary:     Effort: Pulmonary effort is normal.     Breath sounds: Normal breath sounds.  Abdominal:     General: Bowel sounds are normal. There is no distension.     Palpations: Abdomen is soft. There is no mass.     Tenderness: There is no abdominal tenderness. There is no left CVA tenderness, guarding or rebound.  Musculoskeletal:     Right lower leg: No edema.     Left lower leg: No edema.  Neurological:     General: No focal deficit present.     Mental Status: She is alert and oriented to person, place, and time.  Psychiatric:        Mood and Affect: Mood normal.     .. Results for orders placed or performed in visit on 06/17/21  POCT glycosylated hemoglobin (Hb A1C)  Result Value Ref Range   Hemoglobin A1C 5.6 4.0 - 5.6 %   HbA1c POC (<> result, manual entry)     HbA1c, POC (prediabetic range)     HbA1c, POC (controlled diabetic range)          Assessment & Plan:  Marland KitchenMarland KitchenMatelyn was seen today for follow-up.  Diagnoses and all orders for this visit:  Type 2 diabetes mellitus with hyperglycemia, without long-term current use of insulin (HCC) -     POCT glycosylated hemoglobin (Hb A1C)  Medication side effect  Recurrent major depressive disorder, in partial remission (HCC)  Overweight (BMI 25.0-29.9)  A1C is great. Down from 7.0 to 5.6.  Continue ozempic and metformin.  If diarrhea continues ok to stop metformin.  Down 9lbs. Increase ozempic to .5mg  weekly.  On statin.  On ACE. BP to goal.  Needs eye exam.  .. Diabetic Foot Exam - Simple   Simple Foot Form  06/17/2021  8:04 AM  Visual Inspection No deformities, no ulcerations, no other skin breakdown bilaterally: Yes Sensation Testing Intact to touch and monofilament  testing bilaterally: Yes Pulse Check Posterior Tibialis and Dorsalis pulse intact bilaterally: Yes Comments     Covid/flu/pneumonia shot UTD.  Follow up in 3 months.   Discussed mood and no motivation. Follow up with behavioral health.

## 2021-06-18 ENCOUNTER — Ambulatory Visit (INDEPENDENT_AMBULATORY_CARE_PROVIDER_SITE_OTHER): Payer: No Payment, Other | Admitting: Clinical

## 2021-06-18 DIAGNOSIS — F411 Generalized anxiety disorder: Secondary | ICD-10-CM | POA: Diagnosis not present

## 2021-06-18 DIAGNOSIS — E663 Overweight: Secondary | ICD-10-CM | POA: Insufficient documentation

## 2021-06-18 NOTE — Progress Notes (Signed)
   THERAPIST PROGRESS NOTE Virtual Visit via Video Note  I connected with Para March on 06/18/21 at 10:00 AM EDT by a video enabled telemedicine application and verified that I am speaking with the correct person using two identifiers.  Location: Patient: home Provider: office   I discussed the limitations of evaluation and management by telemedicine and the availability of in person appointments. The patient expressed understanding and agreed to proceed.   Follow Up Instructions: I discussed the assessment and treatment plan with the patient. The patient was provided an opportunity to ask questions and all were answered. The patient agreed with the plan and demonstrated an understanding of the instructions.   The patient was advised to call back or seek an in-person evaluation if the symptoms worsen or if the condition fails to improve as anticipated.   Session Time: 25 minutes  Participation Level: Active  Behavioral Response: CasualAlertDepressed  Type of Therapy: Individual Therapy  Treatment Goals addressed: Coping  Interventions: CBT and Supportive  Summary:  Frances [name] is a 45 y.o. female who presents for the scheduled session oriented times five, appropriately dressed, and friendly. Client denied hallucinations and delusions. Client presents for initial session with this therapist. Client reported on today she has been maintaining okay but her depressive symptoms have persisted since beginning medication. Client reported she struggles with lack of motivation, sleeping too much and poor appetite. Client reported previous success with therapy and medication services at [facility]. Client reported depressive symptoms since her childhood. Client reported after her parents divorced she felt disappointed because her father was distant. Client reported he would see her and her siblings four times a year. Client reported he started a new family and they do not have a  relationship. Client reported she would like to work on her feelings about her father and work on her abandonment issues. Client completed the following SDOH:  Flowsheet Row Counselor from 06/18/2021 in [facility]  PHQ-9 Total Score 15         Suicidal/Homicidal: Nowithout intent/plan  Therapist Response:  Therapist began the session making introductions and discussing confidentiality. Therapist asked the client how she has been doing since last seen. Therapist used active listening and positive emotional support.  Therapist completed SDOH. Therapist used CBT to ask about her mental health history and ongoing symptoms. Therapist asked the client about her goals for therapy. Client was scheduled for next appointment.    Plan: Return again in 5 weeks.  Diagnosis: Generalized anxiety disorder   Birdena Jubilee Luiz Trumpower, LCSW 06/18/2021

## 2021-06-26 MED FILL — HYDROXYCHLOROQUINE 200 MG TABLET: ORAL | 30 days supply | Qty: 60 | Fill #3

## 2021-06-26 MED FILL — FAMOTIDINE 40 MG TABLET: ORAL | 60 days supply | Qty: 60 | Fill #3

## 2021-06-26 MED FILL — DICLOFENAC SODIUM 75 MG TABLET,DELAYED RELEASE: ORAL | 30 days supply | Qty: 60 | Fill #8

## 2021-06-26 MED FILL — CYMBALTA 30 MG CAPSULE,DELAYED RELEASE: ORAL | 30 days supply | Qty: 60 | Fill #3

## 2021-06-27 ENCOUNTER — Encounter: Payer: Self-pay | Admitting: Specialist

## 2021-06-27 ENCOUNTER — Other Ambulatory Visit: Payer: Self-pay

## 2021-06-27 ENCOUNTER — Ambulatory Visit (INDEPENDENT_AMBULATORY_CARE_PROVIDER_SITE_OTHER): Payer: Self-pay | Admitting: Specialist

## 2021-06-27 VITALS — BP 95/66 | HR 88 | Ht 67.0 in | Wt 180.0 lb

## 2021-06-27 DIAGNOSIS — M5126 Other intervertebral disc displacement, lumbar region: Secondary | ICD-10-CM

## 2021-06-27 DIAGNOSIS — M48062 Spinal stenosis, lumbar region with neurogenic claudication: Secondary | ICD-10-CM

## 2021-06-27 DIAGNOSIS — M25552 Pain in left hip: Secondary | ICD-10-CM

## 2021-06-27 DIAGNOSIS — M5136 Other intervertebral disc degeneration, lumbar region: Secondary | ICD-10-CM

## 2021-06-27 NOTE — Progress Notes (Signed)
Office Visit Note   Patient: Frances [name]           Date of Birth: 21-Apr-1976           MRN: [id number] Visit Date: 06/27/2021              Requested by: Donella Stade, PA-C [address] [place],  [place] [address] PCP: Donella Stade, PA-C   Assessment & Plan: Visit Diagnoses:  1. Spinal stenosis of lumbar region with neurogenic claudication   2. DDD (degenerative disc disease), lumbar   3. Herniation of lumbar intervertebral disc   4. Pain in left hip     Plan: Avoid bending, stooping and avoid lifting weights greater than 10 lbs. Avoid prolong standing and walking. Avoid frequent bending and stooping  No lifting greater than 10 lbs. May use ice or moist heat for pain. Weight loss is of benefit. Handicap license is approved. [facility] will call to arrange for epidural steroid injection at the left side L4-5   Follow-Up Instructions: No follow-ups on file.   Orders:  No orders of the defined types were placed in this encounter.  No orders of the defined types were placed in this encounter.     Procedures: No procedures performed   Clinical Data: No additional findings.   Subjective: Chief Complaint  Patient presents with  . Lower Back - Follow-up    Here to review MRI of Lumbar    HPI  Review of Systems  Constitutional: Negative.   HENT: Negative.    Eyes: Negative.   Respiratory: Negative.    Cardiovascular: Negative.   Gastrointestinal: Negative.   Endocrine: Negative.   Genitourinary: Negative.   Musculoskeletal: Negative.   Skin: Negative.   Allergic/Immunologic: Negative.   Neurological: Negative.   Hematological: Negative.   Psychiatric/Behavioral: Negative.      Objective: Vital Signs: BP 95/66 (BP Location: Left Arm, Patient Position: Sitting)   Pulse 88   Ht 5\' 7"  (1.702 m)   Wt 180 lb (81.6 kg)   BMI 28.19 kg/m   Physical Exam Constitutional:      Appearance: She is well-developed.  HENT:      Head: Normocephalic and atraumatic.  Eyes:     Pupils: Pupils are equal, round, and reactive to light.  Pulmonary:     Effort: Pulmonary effort is normal.     Breath sounds: Normal breath sounds.  Abdominal:     General: Bowel sounds are normal.     Palpations: Abdomen is soft.  Musculoskeletal:     Cervical back: Normal range of motion and neck supple.     Lumbar back: Negative right straight leg raise test and negative left straight leg raise test.  Skin:    General: Skin is warm and dry.  Neurological:     Mental Status: She is alert and oriented to person, place, and time.  Psychiatric:        Behavior: Behavior normal.        Thought Content: Thought content normal.        Judgment: Judgment normal.   Back Exam   Range of Motion  Extension:  abnormal  Flexion:  abnormal  Lateral bend right:  abnormal  Rotation right:  abnormal  Rotation left:  abnormal   Muscle Strength  Right Quadriceps:  5/5  Left Quadriceps:  5/5  Right Hamstrings:  5/5  Left Hamstrings:  5/5   Tests  Straight leg raise right: negative  Straight leg raise left: negative  Other  Toe walk: abnormal Heel walk: abnormal  Comments:  Left EHL weak 4/5    Specialty Comments:  No specialty comments available.  Imaging: No results found.   PMFS History: Patient Active Problem List   Diagnosis Date Noted  . Overweight (BMI 25.0-29.9) 06/18/2021  . Attention deficit hyperactivity disorder (ADHD), 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  . Hyperlipidemia LDL goal <70 03/18/2021  . Type 2 diabetes mellitus with hyperglycemia, without long-term current use of insulin ([place]) 03/13/2021  . Seasonal allergies 12/25/2020  . Sinus drainage 11/20/2020  . Bilateral leg edema 11/19/2020  . Costochondritis 10/22/2020  . Rib pain on right side 10/22/2020  . Tachycardia 10/22/2020  . Hepatomegaly 10/22/2020  . Symptomatic mammary hypertrophy 09/04/2020  . Xiphoid pain 08/13/2020  . Sternum pain  08/13/2020  . Class 1 obesity due to excess calories without serious comorbidity with body mass index (BMI) of 32.0 to 32.9 in adult 08/13/2020  . Macromastia 06/29/2020  . Recurrent major depressive disorder, in partial remission ([place]) 05/24/2020  . Generalized anxiety disorder 05/24/2020  . DDD (degenerative disc disease), cervical 03/13/2020  . Ingrowing left great toenail 03/13/2020  . Labral tear of hip, degenerative 01/11/2020  . Grief reaction 11/28/2019  . Toenail fungus 09/09/2019  . Vasovagal response 09/09/2019  . Decreased visual acuity 08/30/2018  . Right corneal abrasion 08/30/2018  . Elevated fasting glucose 04/08/2018  . Depression   . Anxiety 01/22/2018  . Borderline personality disorder ([place]) 01/22/2018  . Rheumatoid arthritis 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 ([place]) 12/09/2017  . TMJ (temporomandibular joint syndrome) 09/26/2017  . New daily persistent headache 09/26/2017  . MTHFR gene mutation 09/20/2017  . Bipolar I disorder ([place]) 09/16/2017  . Fibromyalgia 09/16/2017  . Chronic fatigue syndrome 09/16/2017  . DYSPNEA 10/15/2011  . Back pain 10/13/2008  . DERMATITIS, ALLERGIC 06/05/2008  . FATIGUE 05/05/2008  . Hypothyroidism 02/10/2008  . 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 DDD (degenerative disc disease), lumbar 02/10/2008  . FIBROMYALGIA 02/10/2008  . IRON, SERUM, ELEVATED 02/10/2008   Past Medical History:  Diagnosis Date  . Anxiety   . Arthritis   . Back pain, chronic   . Bipolar 1 disorder ([place])   . Chronic fatigue syndrome   . DDD (degenerative disc disease), lumbar   . Depression   . Diabetes mellitus without complication ([place])   . Fibromyalgia   . IUD 2012   Mirena  . MTHFR gene mutation     Family History  Problem Relation Age of Onset  . Diabetes Mother   . Stroke Mother   . Lupus Mother   . Hypertension Mother   . Congestive Heart Failure Mother   . Mental illness Brother        Not clear what his diagnosis  is--may be related to previous drug use.  . Cancer Maternal Grandmother        colon  . Depression Maternal Uncle   . Alcohol abuse Maternal Uncle   . Diabetes Maternal Grandfather     Past Surgical History:  Procedure Laterality Date  . COLONOSCOPY WITH PROPOFOL N/A 02/25/2017   Procedure: COLONOSCOPY WITH PROPOFOL;  Surgeon: Arta Silence, MD;  Location: [facility];  Service: Endoscopy;  Laterality: N/A;  . left knee lateral release  1993  . scar tisue removal  03/2005   left ankle  . TONSILLECTOMY  age 4's   Social History   Occupational History  . Occupation: Designer, industrial/product at times  Tobacco Use  . Smoking status: Never  . Smokeless tobacco: Never  Vaping Use  . Vaping Use: Never used  Substance and Sexual Activity  . Alcohol use: No    Alcohol/week: 0.0 standard drinks  . Drug use: No  . Sexual activity: Yes    Birth control/protection: I.U.D.

## 2021-06-27 NOTE — Patient Instructions (Signed)
Avoid bending, stooping and avoid lifting weights greater than 10 lbs. Avoid prolong standing and walking. Avoid frequent bending and stooping  No lifting greater than 10 lbs. May use ice or moist heat for pain. Weight loss is of benefit. Handicap license is approved. [facility] will call to arrange for epidural steroid injection at the left side L4-5

## 2021-06-28 ENCOUNTER — Encounter: Payer: Self-pay | Admitting: Physician Assistant

## 2021-07-02 ENCOUNTER — Other Ambulatory Visit: Payer: Self-pay | Admitting: Specialist

## 2021-07-02 DIAGNOSIS — M48061 Spinal stenosis, lumbar region without neurogenic claudication: Secondary | ICD-10-CM

## 2021-07-05 ENCOUNTER — Ambulatory Visit
Admission: RE | Admit: 2021-07-05 | Discharge: 2021-07-05 | Disposition: A | Discharge status: Home / Self Care | Payer: Self-pay | Source: Ambulatory Visit | Attending: Specialist | Admitting: Specialist

## 2021-07-05 DIAGNOSIS — M48061 Spinal stenosis, lumbar region without neurogenic claudication: Secondary | ICD-10-CM

## 2021-07-05 MED ORDER — METHYLPREDNISOLONE ACETATE 40 MG/ML INJ SUSP (RADIOLOG
80.0000 mg | Freq: Once | INTRAMUSCULAR | Status: AC
  Administered 2021-07-05: 80 mg via EPIDURAL

## 2021-07-05 MED ORDER — IOPAMIDOL (ISOVUE-M 200) INJECTION 41%
1.0000 mL | Freq: Once | INTRAMUSCULAR | Status: AC
  Administered 2021-07-05: 1 mL via EPIDURAL

## 2021-07-05 NOTE — Discharge Instructions (Signed)

## 2021-07-16 ENCOUNTER — Other Ambulatory Visit: Payer: Self-pay | Admitting: Physician Assistant

## 2021-07-16 ENCOUNTER — Encounter: Payer: Self-pay | Admitting: Physician Assistant

## 2021-07-24 MED FILL — CYMBALTA 30 MG CAPSULE,DELAYED RELEASE: ORAL | 30 days supply | Qty: 60 | Fill #4

## 2021-07-24 MED FILL — HYDROXYCHLOROQUINE 200 MG TABLET: ORAL | 30 days supply | Qty: 60 | Fill #4

## 2021-07-25 ENCOUNTER — Telehealth (INDEPENDENT_AMBULATORY_CARE_PROVIDER_SITE_OTHER): Payer: No Payment, Other | Admitting: Psychiatry

## 2021-07-25 ENCOUNTER — Other Ambulatory Visit: Payer: Self-pay | Admitting: Physician Assistant

## 2021-07-25 ENCOUNTER — Encounter (HOSPITAL_COMMUNITY): Payer: Self-pay | Admitting: Psychiatry

## 2021-07-25 ENCOUNTER — Other Ambulatory Visit: Payer: Self-pay

## 2021-07-25 DIAGNOSIS — F319 Bipolar disorder, unspecified: Secondary | ICD-10-CM

## 2021-07-25 DIAGNOSIS — F900 Attention-deficit hyperactivity disorder, predominantly inattentive type: Secondary | ICD-10-CM | POA: Diagnosis not present

## 2021-07-25 DIAGNOSIS — M940 Chondrocostal junction syndrome [Tietze]: Secondary | ICD-10-CM

## 2021-07-25 DIAGNOSIS — R0781 Pleurodynia: Secondary | ICD-10-CM

## 2021-07-25 MED ORDER — BUPROPION HCL ER (XL) 150 MG PO TB24: 150.0000 mg | ORAL_TABLET | ORAL | 3 refills | Status: DC

## 2021-07-25 MED ORDER — ARIPIPRAZOLE 15 MG PO TABS: 15.0000 mg | ORAL_TABLET | Freq: Every day | ORAL | 3 refills | Status: DC

## 2021-07-25 MED ORDER — BUPROPION HCL ER (XL) 300 MG PO TB24: 300.0000 mg | ORAL_TABLET | Freq: Every day | ORAL | 3 refills | Status: DC

## 2021-07-25 MED ORDER — BENZTROPINE MESYLATE 0.5 MG PO TABS: 0.5000 mg | ORAL_TABLET | Freq: Every day | ORAL | 3 refills | Status: DC

## 2021-07-25 NOTE — Progress Notes (Signed)
Frances Ann MD/PA/NP OP Progress Note Virtual Visit via Telephone Note  I connected with Para March on 07/25/21 at  1:00 PM EDT by telephone and verified that I am speaking with the correct person using two identifiers.  Location: Patient: home Provider: Clinic   I discussed the limitations, risks, security and privacy concerns of performing an evaluation and management service by telephone and the availability of in person appointments. I also discussed with the patient that there may be a patient responsible charge related to this service. The patient expressed understanding and agreed to proceed.   I provided 30 minutes of non-face-to-face time during this encounter.      07/25/2021 1:15 PM Frances [name]  MRN:  [id number]  Chief Complaint: "I have been sleeping alot"  HPI: 45 year old female seen today for follow up psychiatric evaluation. She has a psychiatric history of bipolar 1, borderline personality disorder, depression, and anxiety. She is currently managed on Abilify 15 mg nightly, Cogentin 0.5 mg daily, Wellbutrin 450 mg daily, trazodone 50-100 mg nightly, and Cymbalta 60 mg daily (from rheumatologist), and gabapentin 800 mg three times daily (from rheumatologist which she recently started for fibromyalgia). Today, patient notes that medications is somewhat effective in managing her symptoms.   Today the patient was unable to login virtually so her assessment was done over the phone.  During exam she was pleasant, cooperative, and engaged in conversation.  She informed provider that she has been sleeping a lot.  She notes that she can go to bed around 8 or 9 PM and then wake up the next afternoon.  Provider informed patient that gabapentin is sedating and trazodone is also sedating.  Provider informed patient that she is on 3 antidepressants is unsafe and recommended taking away trazodone.  She was agreeable to this recommendation.  Patient notes that at times she still  becomes anxious and depressed.  Provider informed patient that Cymbalta can be adjusted however instructed her to talk to her rheumatologist about that since they are filling it.  She endorsed understanding and agreed.  Provider conducted a GAD-7 and patient scored a 5, at her last visit she scored a 3.  Provider also conducted a PHQ-9 and patient scored a 12, at her last visit she scored an 11.  Patient notes that her mood has been stable on her current dose of Abilify and denies symptoms of mania, SI/HI/VAH, or paranoia. She endorses adequate appetite.  Patient informed Probation officer that since she found out about her diabetes she has been actively trying to eat healthier.  She notes that she has lost 35 pounds since March.  Patient informed Probation officer that she continues to have poor concentration and notes that the increase in Wellbutrin was ineffective in helping manage her ADHD.  Provider discussed starting a nonstimulant Strattera Strattera, clonidine, or Intuniv.  She notes that she would like to research these medications prior to starting them.  Provider endorsed understanding and agreed.  Today patient is agreeable to discontinuing trazodone.  She will continue all other medications as prescribed and will follow up with outpatient counselor for therapy. No other concerns noted at this time.   Visit Diagnosis:    ICD-10-CM   1. Bipolar I disorder (HCC)  F31.9 ARIPiprazole (ABILIFY) 15 MG tablet    benztropine (COGENTIN) 0.5 MG tablet    buPROPion (WELLBUTRIN XL) 150 MG 24 hr tablet    buPROPion (WELLBUTRIN XL) 300 MG 24 hr tablet    2. Attention deficit hyperactivity  disorder (ADHD), predominantly inattentive type  F90.0 buPROPion (WELLBUTRIN XL) 150 MG 24 hr tablet    buPROPion (WELLBUTRIN XL) 300 MG 24 hr tablet      Past Psychiatric History: Bipolar, anxiety, and depression Past Medical History:  Past Medical History:  Diagnosis Date   Anxiety    Arthritis    Back pain, chronic    Bipolar 1  disorder (HCC)    Chronic fatigue syndrome    DDD (degenerative disc disease), lumbar    Depression    Diabetes mellitus without complication ([place])    Fibromyalgia    IUD 2012   Mirena   MTHFR gene mutation     Past Surgical History:  Procedure Laterality Date   COLONOSCOPY WITH PROPOFOL N/A 02/25/2017   Procedure: COLONOSCOPY WITH PROPOFOL;  Surgeon: Arta Silence, MD;  Location: [facility];  Service: Endoscopy;  Laterality: N/A;   left knee lateral release  1993   scar tisue removal  03/2005   left ankle   TONSILLECTOMY  age 48's    Family Psychiatric History: Maternal aunts Bipolar disorder and uncle alcoholic  Family History:  Family History  Problem Relation Age of Onset   Diabetes Mother    Stroke Mother    Lupus Mother    Hypertension Mother    Congestive Heart Failure Mother    Mental illness Brother        Not clear what his diagnosis is--may be related to previous drug use.   Cancer Maternal Grandmother        colon   Depression Maternal Uncle    Alcohol abuse Maternal Uncle    Diabetes Maternal Grandfather     Social History:  Social History   Socioeconomic History   Marital status: Single    Spouse name: Not on file   Number of children: 0   Years of education: Not on file   Highest education level: Bachelor's degree (e.g., BA, AB, BS)  Occupational History   Occupation: Designer, industrial/product at times  Tobacco Use   Smoking status: Never   Smokeless tobacco: Never  Vaping Use   Vaping Use: Never used  Substance and Sexual Activity   Alcohol use: No    Alcohol/week: 0.0 standard drinks   Drug use: No   Sexual activity: Yes    Birth control/protection: I.U.D.  Other Topics Concern   Not on file  Social History Narrative   Originally from [place], outside of [place].    Moved here permanently 2012.   Intermittently worked on a farm.   Lives with many cats--3 plus fosters cats.    Single, lives alone in a one story home. Rarely drinks caffeine.  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   Social Determinants of Health   Financial Resource Strain: Medium Risk   Difficulty of Paying Living Expenses: Somewhat hard  Food Insecurity: No Food Insecurity   Worried About Charity fundraiser in the Last Year: Never true   Ran Out of Food in the Last Year: Never true  Transportation Needs: No Transportation Needs   Lack of Transportation (Medical): No   Lack of Transportation (Non-Medical): No  Physical Activity: Inactive   Days of Exercise per Week: 0 days   Minutes of Exercise per Session: 0 min  Stress: No Stress Concern Present   Feeling of Stress : Not at all  Social Connections: Moderately Isolated   Frequency of Communication with Friends and Family: More than three times a week  Frequency of Social Gatherings with Friends and Family: More than three times a week   Attends Religious Services: Never   Marine scientist or Organizations: Yes   Attends Music therapist: More than 4 times per year   Marital Status: Never married    Allergies:  Allergies  Allergen Reactions   Metaxalone Hives and Other (See Comments)    Metabolic Disorder Labs: Lab Results  Component Value Date   HGBA1C 5.6 06/17/2021   MPG 103 04/14/2018   No results found for: PROLACTIN Lab Results  Component Value Date   CHOL 130 04/14/2018   TRIG 192 (H) 04/14/2018   HDL 28 (L) 04/14/2018   CHOLHDL 4.6 04/14/2018   LDLCALC 73 04/14/2018   LDLCALC 99 03/04/2016   Lab Results  Component Value Date   TSH 0.866 11/21/2020   TSH 1.616 01/27/2018    Therapeutic Level Labs: No results found for: LITHIUM No results found for: VALPROATE No components found for:  CBMZ  Current Medications: Current Outpatient Medications  Medication Sig Dispense Refill   ARIPiprazole (ABILIFY) 15 MG tablet Take 1 tablet (15 mg total) by mouth daily. 30 tablet 3   atorvastatin (LIPITOR) 10 MG tablet Take 1 tablet (10  mg total) by mouth daily. 90 tablet 1   Azelastine HCl 137 MCG/SPRAY SOLN PLACE 2 APPLICATORS INTO ALTERNATE NOSTRILS TWICE A DAY 30 mL 2   benztropine (COGENTIN) 0.5 MG tablet Take 1 tablet (0.5 mg total) by mouth daily. 30 tablet 3   blood glucose meter kit and supplies KIT Dispense based on patient and insurance preference. Use up to four times daily as directed. 1 each 0   buPROPion (WELLBUTRIN XL) 150 MG 24 hr tablet Take 1 tablet (150 mg total) by mouth every morning. 30 tablet 3   buPROPion (WELLBUTRIN XL) 300 MG 24 hr tablet Take 1 tablet (300 mg total) by mouth daily. 30 tablet 3   cholecalciferol (VITAMIN D3) 25 MCG (1000 UT) tablet Take 1,000 Units by mouth daily.     DULoxetine (CYMBALTA) 60 MG capsule Take 60 mg by mouth daily.     famotidine (PEPCID) 20 MG tablet Take 20 mg by mouth daily.     fluticasone (FLONASE) 50 MCG/ACT nasal spray USE 2 SPRAYS IN EACH NOSTRIL EVERY DAY 16 g 1   gabapentin (NEURONTIN) 800 MG tablet TAKE 1 TABLET (800 MG TOTAL) BY MOUTH 3 (THREE) TIMES DAILY. 90 tablet 3   hydrochlorothiazide (HYDRODIURIL) 12.5 MG tablet Take 1 tablet (12.5 mg total) by mouth daily. (Patient not taking: Reported on 05/29/2021) 30 tablet 2   HYDROcodone-acetaminophen (NORCO/VICODIN) 5-325 MG tablet Take 1 tablet by mouth 2 (two) times daily as needed for moderate pain (back pain). 60 tablet 0   hydroxychloroquine (PLAQUENIL) 200 MG tablet Take 200 mg by mouth in the morning and at bedtime.     levocetirizine (XYZAL) 5 MG tablet TAKE 1 TABLET BY MOUTH EVERY EVENING 90 tablet 1   lisinopril (ZESTRIL) 5 MG tablet Take 1 tablet (5 mg total) by mouth daily. 90 tablet 1   meloxicam (MOBIC) 15 MG tablet Take 1 tablet by mouth daily. (Patient not taking: Reported on 05/29/2021)     metFORMIN (GLUCOPHAGE) 1000 MG tablet Take 1 tablet (1,000 mg total) by mouth 2 (two) times daily with a meal. 180 tablet 1   methocarbamol (ROBAXIN) 500 MG tablet Take 1 tablet (500 mg total) by mouth 3 (three)  times daily. 90 tablet 1   montelukast (  SINGULAIR) 10 MG tablet TAKE 1 Tablet BY MOUTH ONCE EVERY NIGHT AT BEDTIME 90 tablet 1   pantoprazole (PROTONIX) 40 MG tablet TAKE 1 TABLET BY MOUTH DAILY 90 tablet 1   Semaglutide,0.25 or 0.5MG/DOS, (OZEMPIC, 0.25 OR 0.5 MG/DOSE,) 2 MG/1.5ML SOPN Inject 0.25 mg into the skin once a week. 1.5 mL 0   No current facility-administered medications for this visit.     Musculoskeletal: Strength & Muscle Tone:  Unable to assess due to telephone visit Gait & Station:  Unable to assess due to telephone visit Patient leans: N/A  Psychiatric Specialty Exam: Review of Systems  There were no vitals taken for this visit.There is no height or weight on file to calculate BMI.  General Appearance:  Unable to assess due to telephone visit  Eye Contact:  Good  Speech:  Clear and Coherent and Normal Rate  Volume:  Normal  Mood:  Depressed  Affect:  Congruent  Thought Process:  Coherent, Goal Directed and Linear  Orientation:  Full (Time, Place, and Person)  Thought Content: WDL and Logical   Suicidal Thoughts:  No  Homicidal Thoughts:  No  Memory:  Immediate;   Good Recent;   Good Remote;   Good  Judgement:  Good  Insight:  Good  Psychomotor Activity:   Unable to assess due to telephone visit  Concentration:  Concentration: Fair and Attention Span: Fair  Recall:  Good  Fund of Knowledge: Good  Language: Good  Akathisia:  No  Handed:  Right  AIMS (if indicated): Not done  Assets:  Communication Skills Desire for Improvement Financial Resources/Insurance Housing Social Support  ADL's:  Intact  Cognition: WNL  Sleep:  Good   Screenings: AIMS    Flowsheet Row Admission (Discharged) from 01/25/2018 in [facility]  AIMS Total Score 0      AUDIT    Flowsheet Row Admission (Discharged) from 01/25/2018 in [facility]  Alcohol Use Disorder Identification Test Final Score (AUDIT) 0       GAD-7    Flowsheet Row Video Visit from 07/25/2021 in [facility] Video Visit from 04/24/2021 in [facility] Video Visit from 01/24/2021 in [facility] Counselor from 12/06/2020 in [facility] Counselor from 10/04/2020 in [facility]  Total GAD-7 Score 5 3 3 3 3       PHQ2-9    Flowsheet Row Video Visit from 07/25/2021 in [facility] Counselor from 06/18/2021 in [facility] Video Visit from 04/24/2021 in [facility] Nutrition from [facility]18/2021 [facility] Video Visit from 01/24/2021 in [facility]  PHQ-2 Total Score 3 4 3 1 3   PHQ-9 Total Score 12 15 11  -- [place] [facility] from 05/05/2021 in [facility] from 01/30/2021 in [place] No Risk        Assessment and Plan: Patient reports that her ADHD is not improved since her last visit.  She also notes that at times she becomes anxious and depressed but is able to cope with it.  She informed Probation officer that she has been on sleeping. Provider discussed starting a nonstimulant Strattera Strattera, clonidine, or Intuniv.  She notes that she would like to research these medications prior to  starting them.  Provider endorsed understanding and agreed.  Today patient is agreeable to discontinuing trazodone to reduce hypersomnia.  Patient notes that she will discuss with her rheumatologist about adjusting Cymbalta and gabapentin.  1. Bipolar I disorder (HCC)  Continue- ARIPiprazole (ABILIFY) 15 MG tablet; Take 1 tablet (15 mg total) by mouth daily.  Dispense: 30 tablet; Refill: 3 Continue- benztropine (COGENTIN) 0.5 MG tablet; Take 1 tablet (0.5 mg total) by mouth daily.   Dispense: 30 tablet; Refill: 3 Continue- buPROPion (WELLBUTRIN XL) 300 MG 24 hr tablet; Take 1 tablet (300 mg total) by mouth daily.  Dispense: 30 tablet; Refill: 3 Continue- buPROPion (WELLBUTRIN XL) 150 MG 24 hr tablet; Take 1 tablet (150 mg total) by mouth every morning.  Dispense: 30 tablet; Refill: 3  2. Attention deficit hyperactivity disorder (ADHD), predominantly inattentive type  Continue- buPROPion (WELLBUTRIN XL) 300 MG 24 hr tablet; Take 1 tablet (300 mg total) by mouth daily.  Dispense: 30 tablet; Refill: 3 Continue- buPROPion (WELLBUTRIN XL) 150 MG 24 hr tablet; Take 1 tablet (150 mg total) by mouth every morning.  Dispense: 30 tablet; Refill: 3   Follow up in 3 moths Follow up with therapy   Salley Slaughter, NP 07/25/2021, 1:15 PM

## 2021-07-26 ENCOUNTER — Encounter (INDEPENDENT_AMBULATORY_CARE_PROVIDER_SITE_OTHER): Payer: Self-pay

## 2021-07-26 DIAGNOSIS — M5030 Other cervical disc degeneration, unspecified cervical region: Secondary | ICD-10-CM

## 2021-08-01 ENCOUNTER — Ambulatory Visit (INDEPENDENT_AMBULATORY_CARE_PROVIDER_SITE_OTHER): Payer: Self-pay | Admitting: Specialist

## 2021-08-01 ENCOUNTER — Other Ambulatory Visit: Payer: Self-pay

## 2021-08-01 ENCOUNTER — Encounter: Payer: Self-pay | Admitting: Specialist

## 2021-08-01 VITALS — BP 110/78 | HR 106 | Ht 67.0 in | Wt 180.0 lb

## 2021-08-01 DIAGNOSIS — M48062 Spinal stenosis, lumbar region with neurogenic claudication: Secondary | ICD-10-CM

## 2021-08-01 DIAGNOSIS — M5126 Other intervertebral disc displacement, lumbar region: Secondary | ICD-10-CM

## 2021-08-01 DIAGNOSIS — M25552 Pain in left hip: Secondary | ICD-10-CM

## 2021-08-01 DIAGNOSIS — M5136 Other intervertebral disc degeneration, lumbar region: Secondary | ICD-10-CM

## 2021-08-01 MED ORDER — PREGABALIN 50 MG PO CAPS: 50.0000 mg | ORAL_CAPSULE | Freq: Two times a day (BID) | ORAL | 0 refills | Status: DC

## 2021-08-01 NOTE — Patient Instructions (Addendum)
Plan: Avoid frequent bending and stooping  No lifting greater than 10 lbs. May use ice or moist heat for pain. Weight loss is of benefit. Best medication for lumbar disc disease is arthritis medications like motrin and naprosyn. Exercise is important to improve your indurance and does allow people to function better inspite of back pain. Lyrica for chronic neurogenic pain.

## 2021-08-01 NOTE — Progress Notes (Signed)
Office Visit Note   Patient: Frances [name]           Date of Birth: Feb 18, 1976           MRN: [id number] Visit Date: 08/01/2021              Requested by: Donella Stade, PA-C [address] [place],  [place] [address] PCP: Donella Stade, PA-C   Assessment & Plan: Visit Diagnoses:  1. DDD (degenerative disc disease), lumbar   2. Herniation of lumbar intervertebral disc   3. Spinal stenosis of lumbar region with neurogenic claudication   4. Pain in left hip     Plan: Avoid frequent bending and stooping  No lifting greater than 10 lbs. May use ice or moist heat for pain. Weight loss is of benefit. Best medication for lumbar disc disease is arthritis medications like motrin and naprosyn. Exercise is important to improve your indurance and does allow people to function better inspite of back pain.    Follow-Up Instructions: No follow-ups on file.   Orders:  No orders of the defined types were placed in this encounter.  No orders of the defined types were placed in this encounter.     Procedures: No procedures performed   Clinical Data: No additional findings.   Subjective: Chief Complaint  Patient presents with   Lower Back - Follow-up    She had a Left L4-5 IL injection 07/05/21 @ [facility]    45 year old female with historyof left mid and upper lumbar pain. Pain with standing and occasionally with standing. ESI have not been of benefit.  She is able to walk a mile and can grocery shop. 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 No bowel or bladder difficulty.  Review of Systems  Constitutional: Negative.   HENT: Negative.    Eyes: Negative.   Respiratory: Negative.    Cardiovascular: Negative.   Gastrointestinal: Negative.   Endocrine: Negative.   Genitourinary: Negative.   Musculoskeletal: Negative.   Skin: Negative.   Allergic/Immunologic: Negative.   Neurological:  Negative.   Hematological: Negative.   Psychiatric/Behavioral: Negative.      Objective: Vital Signs: BP 110/78 (BP Location: Left Arm, Patient Position: Sitting)   Pulse (!) 106   Ht '5\' 7"'$  (1.702 m)   Wt 180 lb (81.6 kg)   BMI 28.19 kg/m   Physical Exam Constitutional:      Appearance: She is well-developed.  HENT:     Head: Normocephalic and atraumatic.  Eyes:     Pupils: Pupils are equal, round, and reactive to light.  Pulmonary:     Effort: Pulmonary effort is normal.     Breath sounds: Normal breath sounds.  Abdominal:     General: Bowel sounds are normal.     Palpations: Abdomen is soft.  Musculoskeletal:     Cervical back: Normal range of motion and neck supple.     Lumbar back: Negative right straight leg raise test and negative left straight leg raise test.  Skin:    General: Skin is warm and dry.  Neurological:     Mental Status: She is alert and oriented to person, place, and time.  Psychiatric:        Behavior: Behavior normal.        Thought Content: Thought content normal.        Judgment: Judgment normal.    Back Exam  Tenderness  The patient is experiencing tenderness in the lumbar.  Range of Motion  Lateral bend right:  abnormal  Lateral bend left:  abnormal  Rotation right:  abnormal  Rotation left:  abnormal   Muscle Strength  Right Quadriceps:  5/5  Left Quadriceps:  5/5  Right Hamstrings:  5/5  Left Hamstrings:  5/5   Tests  Straight leg raise right: negative Straight leg raise left: negative    Specialty Comments:  No specialty comments available.  Imaging: No results found.   PMFS History: Patient Active Problem List   Diagnosis Date Noted   Overweight (BMI 25.0-29.9) 06/18/2021   Attention deficit hyperactivity disorder (ADHD), 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   Hyperlipidemia LDL goal <70 03/18/2021   Type 2 diabetes mellitus with hyperglycemia, without long-term current use of insulin ([place]) 03/13/2021    Seasonal allergies 12/25/2020   Sinus drainage 11/20/2020   Bilateral leg edema 11/19/2020   Costochondritis 10/22/2020   Rib pain on right side 10/22/2020   Tachycardia 10/22/2020   Hepatomegaly 10/22/2020   Symptomatic mammary hypertrophy 09/04/2020   Xiphoid pain 08/13/2020   Sternum pain 08/13/2020   Class 1 obesity due to excess calories without serious comorbidity with body mass index (BMI) of 32.0 to 32.9 in adult 08/13/2020   Macromastia 06/29/2020   Recurrent major depressive disorder, in partial remission ([place]) 05/24/2020   Generalized anxiety disorder 05/24/2020   DDD (degenerative disc disease), cervical 03/13/2020   Ingrowing left great toenail 03/13/2020   Labral tear of hip, degenerative 01/11/2020   Grief reaction 11/28/2019   Toenail fungus 09/09/2019   Vasovagal response 09/09/2019   Decreased visual acuity 08/30/2018   Right corneal abrasion 08/30/2018   Elevated fasting glucose 04/08/2018   Depression    Anxiety 01/22/2018   Borderline personality disorder ([place]) 01/22/2018   Rheumatoid arthritis 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 ([place]) 12/09/2017   TMJ (temporomandibular joint syndrome) 09/26/2017   New daily persistent headache 09/26/2017   MTHFR gene mutation 09/20/2017   Bipolar I disorder ([place]) 09/16/2017   Fibromyalgia 09/16/2017   Chronic fatigue syndrome 09/16/2017   DYSPNEA 10/15/2011   Back pain 10/13/2008   DERMATITIS, ALLERGIC 06/05/2008   FATIGUE 05/05/2008   Hypothyroidism 02/10/2008   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   DDD (degenerative disc disease), lumbar 02/10/2008   FIBROMYALGIA 02/10/2008   IRON, SERUM, ELEVATED 02/10/2008   Past Medical History:  Diagnosis Date   Anxiety    Arthritis    Back pain, chronic    Bipolar 1 disorder (HCC)    Chronic fatigue syndrome    DDD (degenerative disc disease), lumbar    Depression    Diabetes mellitus without complication ([place])    Fibromyalgia    IUD 2012   Mirena    MTHFR gene mutation     Family History  Problem Relation Age of Onset   Diabetes Mother    Stroke Mother    Lupus Mother    Hypertension Mother    Congestive Heart Failure Mother    Mental illness Brother        Not clear what his diagnosis is--may be related to previous drug use.   Cancer Maternal Grandmother        colon   Depression Maternal Uncle    Alcohol abuse Maternal Uncle    Diabetes Maternal Grandfather     Past Surgical History:  Procedure Laterality Date   COLONOSCOPY WITH PROPOFOL N/A 02/25/2017   Procedure: COLONOSCOPY WITH PROPOFOL;  Surgeon: Gwyndolyn Saxon  Paulita Fujita, MD;  Location: [facility];  Service: Endoscopy;  Laterality: N/A;   left knee lateral release  1993   scar tisue removal  03/2005   left ankle   TONSILLECTOMY  age 42's   Social History   Occupational History   Occupation: Designer, industrial/product at times  Tobacco Use   Smoking status: Never   Smokeless tobacco: Never  Vaping Use   Vaping Use: Never used  Substance and Sexual Activity   Alcohol use: No    Alcohol/week: 0.0 standard drinks   Drug use: No   Sexual activity: Yes    Birth control/protection: I.U.D.

## 2021-08-08 DIAGNOSIS — M797 Fibromyalgia: Principal | ICD-10-CM

## 2021-08-08 MED ORDER — DULOXETINE 30 MG CAPSULE,DELAYED RELEASE
ORAL_CAPSULE | Freq: Every day | ORAL | 5 refills | 30 days | Status: CP
Start: 2021-08-08 — End: ?
  Filled 2021-08-13: qty 90, 30d supply, fill #0

## 2021-08-19 ENCOUNTER — Ambulatory Visit (INDEPENDENT_AMBULATORY_CARE_PROVIDER_SITE_OTHER): Payer: No Payment, Other | Admitting: Clinical

## 2021-08-19 ENCOUNTER — Other Ambulatory Visit: Payer: Self-pay

## 2021-08-19 DIAGNOSIS — F319 Bipolar disorder, unspecified: Secondary | ICD-10-CM | POA: Diagnosis not present

## 2021-08-19 NOTE — Progress Notes (Signed)
   THERAPIST PROGRESS NOTE Virtual Visit via Video Note  I connected with Frances [name] on 08/19/21 at  2:00 PM EDT by a video enabled telemedicine application and verified that I am speaking with the correct person using two identifiers.  Location: Patient: home Provider: office   I discussed the limitations of evaluation and management by telemedicine and the availability of in person appointments. The patient expressed understanding and agreed to proceed.   Follow Up Instructions: I discussed the assessment and treatment plan with the patient. The patient was provided an opportunity to ask questions and all were answered. The patient agreed with the plan and demonstrated an understanding of the instructions.   The patient was advised to call back or seek an in-person evaluation if the symptoms worsen or if the condition fails to improve as anticipated.   Session Time: 35 minutes  Participation Level: Active  Behavioral Response: CasualAlertDepressed  Type of Therapy: Individual Therapy  Treatment Goals addressed: Coping  Interventions: CBT and Supportive  Summary:  Frances [name] is a 45 y.o. female who presents for the scheduled session oriented x5, neatly dressed, and friendly.  Client denied hallucinations and delusions. Client reported on today she is doing well.  Client reported she generally spends time with her friends and riding her horse. Client reported on days when she is alone she feels depressed. Client reported she has a court date coming up in November 2022 for SSI. Client reported she is looking forward to that. Client reported she has thoughts about her childhood of when her father left and started a new family. Client reported she and her siblings only saw their father on their birthdays. Client reported her siblings maintained a relationship with their father but she did not. Client reported feeling like they were tossed out like trash and not good  enough for him. Client reported she has written letters addressed to him but has not sent them. Client reported on days when she is alone she has thoughts about not liking herself. Client reported "most of the time I don't like myself". Client reported her self talk is negative majority of the time. Client reported she is attending in person and/or virtual group with the [employer] and has a peer support specialist with them as well.    Suicidal/Homicidal: Nowithout intent/plan  Therapist Response:  Therapist began the session asking how she has been doing since last seen. Therapist used CBT to utilize positive emotional support and active listening to support her thoughts and feelings. Therapist used CBT to engage with the client to ask her to identify source of negative thoughts and feelings. Therapist assigned the client homework to write down what she wants her character to be and practice changing her negative thought to positive. Client was scheduled for next appointment.     Plan: Return again in 4 weeks.  Diagnosis: Bipolar 1 disorder  Birdena Jubilee Kendrik Mcshan, LCSW 08/19/2021

## 2021-08-20 ENCOUNTER — Encounter: Payer: Self-pay | Admitting: Physician Assistant

## 2021-08-20 ENCOUNTER — Other Ambulatory Visit: Payer: Self-pay

## 2021-08-20 ENCOUNTER — Ambulatory Visit (INDEPENDENT_AMBULATORY_CARE_PROVIDER_SITE_OTHER): Payer: Self-pay | Admitting: Physician Assistant

## 2021-08-20 VITALS — BP 103/69 | HR 109 | Ht 67.0 in | Wt 158.0 lb

## 2021-08-20 DIAGNOSIS — R0602 Shortness of breath: Secondary | ICD-10-CM

## 2021-08-20 DIAGNOSIS — H6983 Other specified disorders of Eustachian tube, bilateral: Secondary | ICD-10-CM

## 2021-08-20 DIAGNOSIS — H938X3 Other specified disorders of ear, bilateral: Secondary | ICD-10-CM | POA: Insufficient documentation

## 2021-08-20 MED ORDER — PREDNISONE 50 MG PO TABS: 50.0000 mg | ORAL_TABLET | Freq: Every day | ORAL | 0 refills | Status: DC

## 2021-08-20 MED ORDER — ALBUTEROL SULFATE HFA 108 (90 BASE) MCG/ACT IN AERS: INHALATION_SPRAY | RESPIRATORY_TRACT | 1 refills | Status: DC

## 2021-08-20 NOTE — Patient Instructions (Addendum)
Stop lisinopril.  Stop HCTZ.    Eustachian Tube Dysfunction  Eustachian tube dysfunction refers to a condition in which a blockage develops in the narrow passage that connects the middle ear to the back of the nose (eustachian tube). The eustachian tube regulates air pressure in the middle ear by letting air move between the ear and nose. It also helps to drain fluid from the middle earspace. Eustachian tube dysfunction can affect one or both ears. When the eustachian tube does not function properly, air pressure, fluid, or both can build up inthe middle ear. What are the causes? This condition occurs when the eustachian tube becomes blocked or cannot open normally. Common causes of this condition include: Ear infections. Colds and other infections that affect the nose, mouth, and throat (upper respiratory tract). Allergies. Irritation from cigarette smoke. Irritation from stomach acid coming up into the esophagus (gastroesophageal reflux). The esophagus is the tube that carries food from the mouth to the stomach. Sudden changes in air pressure, such as from descending in an airplane or scuba diving. Abnormal growths in the nose or throat, such as: Growths that line the nose (nasal polyps). Abnormal growth of cells (tumors). Enlarged tissue at the back of the throat (adenoids). What increases the risk? You are more likely to develop this condition if: You smoke. You are overweight. You are a child who has: Certain birth defects of the mouth, such as cleft palate. Large tonsils or adenoids. What are the signs or symptoms? Common symptoms of this condition include: A feeling of fullness in the ear. Ear pain. Clicking or popping noises in the ear. Ringing in the ear. Hearing loss. Loss of balance. Dizziness. Symptoms may get worse when the air pressure around you changes, such as whenyou travel to an area of high elevation, fly on an airplane, or go scuba diving. How is this  diagnosed? This condition may be diagnosed based on: Your symptoms. A physical exam of your ears, nose, and throat. Tests, such as those that measure: The movement of your eardrum (tympanogram). Your hearing (audiometry). How is this treated? Treatment depends on the cause and severity of your condition. In mild cases, you may relieve your symptoms by moving air into your ears. This is called "popping the ears." In more severe cases, or if you have symptoms of fluid in your ears, treatment may include: Medicines to relieve congestion (decongestants). Medicines that treat allergies (antihistamines). Nasal sprays or ear drops that contain medicines that reduce swelling (steroids). A procedure to drain the fluid in your eardrum (myringotomy). In this procedure, a small tube is placed in the eardrum to: Drain the fluid. Restore the air in the middle ear space. A procedure to insert a balloon device through the nose to inflate the opening of the eustachian tube (balloon dilation). Follow these instructions at home: Lifestyle Do not do any of the following until your health care provider approves: Travel to high altitudes. Fly in airplanes. Work in a Pension scheme manager or room. Scuba dive. Do not use any products that contain nicotine or tobacco, such as cigarettes and e-cigarettes. If you need help quitting, ask your health care provider. Keep your ears dry. Wear fitted earplugs during showering and bathing. Dry your ears completely after. General instructions Take over-the-counter and prescription medicines only as told by your health care provider. Use techniques to help pop your ears as recommended by your health care provider. These may include: Chewing gum. Yawning. Frequent, forceful swallowing. Closing your mouth, holding your  nose closed, and gently blowing as if you are trying to blow air out of your nose. Keep all follow-up visits as told by your health care provider. This is  important. Contact a health care provider if: Your symptoms do not go away after treatment. Your symptoms come back after treatment. You are unable to pop your ears. You have: A fever. Pain in your ear. Pain in your head or neck. Fluid draining from your ear. Your hearing suddenly changes. You become very dizzy. You lose your balance. Summary Eustachian tube dysfunction refers to a condition in which a blockage develops in the eustachian tube. It can be caused by ear infections, allergies, inhaled irritants, or abnormal growths in the nose or throat. Symptoms include ear pain, hearing loss, or ringing in the ears. Mild cases are treated with maneuvers to unblock the ears, such as yawning or ear popping. Severe cases are treated with medicines. Surgery may also be done (rare). This information is not intended to replace advice given to you by your health care provider. Make sure you discuss any questions you have with your healthcare provider. Document Revised: 03/30/2018 Document Reviewed: 03/30/2018 Elsevier Patient Education  [place].

## 2021-08-20 NOTE — Progress Notes (Signed)
Subjective:    Patient ID: Frances [name], female    DOB: 1976/10/20, 45 y.o.   MRN: [id number]  HPI Pt is a 45 yo female with T2DM, RA, ADHD, Bipolar 1 who presents to the clinic with bilateral ear popping for months and SOB with exertion.   Pt has had intermittent ear popping and pressure for quite some time but more persistently for the last month. No ear pain. No fever, chills, sinus pressure, sT, cough. She is using flonase with little relief. She takes xyzal and singulair daily. No new medication changes.   She also c/o SOB with exertion. She feels like she hardly does anything and SOB. Some days are worse than others. Her gabapentin was switched to lyrica is the only medication change. Denies any wheezing, CP, swelling, coughing. She notices the most when riding her pony. She does not smoke or vape.    .. 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   DERMATITIS, ALLERGIC 06/05/2008   DDD (degenerative disc disease), lumbar 02/10/2008   Back pain 10/13/2008   FIBROMYALGIA 02/10/2008   FATIGUE 05/05/2008   IRON, SERUM, ELEVATED 02/10/2008   DYSPNEA 10/15/2011   Bipolar I disorder ([place]) 09/16/2017   Fibromyalgia 09/16/2017   Chronic fatigue syndrome 09/16/2017   MTHFR gene mutation 09/20/2017   TMJ (temporomandibular joint syndrome) 09/26/2017   New daily persistent headache 09/26/2017   Rheumatoid arthritis 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 ([place]) 12/09/2017   Anxiety 01/22/2018   Borderline personality disorder ([place]) 01/22/2018   Depression    Elevated fasting glucose 04/08/2018   Decreased visual acuity 08/30/2018   Right corneal abrasion 08/30/2018   Toenail fungus 09/09/2019   Vasovagal response 09/09/2019   Grief reaction 11/28/2019   Labral tear of hip, degenerative 01/11/2020   DDD (degenerative disc disease), cervical 03/13/2020   Ingrowing left great toenail  03/13/2020   Recurrent major depressive disorder, in partial remission ([place]) 05/24/2020   Generalized anxiety disorder 05/24/2020   Macromastia 06/29/2020   Xiphoid pain 08/13/2020   Sternum pain 08/13/2020   Class 1 obesity due to excess calories without serious comorbidity with body mass index (BMI) of 32.0 to 32.9 in adult 08/13/2020   Symptomatic mammary hypertrophy 09/04/2020   Costochondritis 10/22/2020   Rib pain on right side 10/22/2020   Tachycardia 10/22/2020   Hepatomegaly 10/22/2020   Bilateral leg edema 11/19/2020   Sinus drainage 11/20/2020   Seasonal allergies 12/25/2020   Type 2 diabetes mellitus with hyperglycemia, without long-term current use of insulin ([place]) 03/13/2021   Hyperlipidemia LDL goal <70 03/18/2021   Attention deficit hyperactivity disorder (ADHD), 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   Overweight (BMI 25.0-29.9) 06/18/2021   SOB (shortness of breath) on exertion 08/20/2021   Ear popping, bilateral 08/20/2021   ETD (Eustachian tube dysfunction), bilateral 08/23/2021   Resolved Ambulatory Problems    Diagnosis Date Noted   FEVER, RECURRENT 04/02/2009   SINUSITIS- ACUTE-NOS 03/24/2008   URI 01/17/2009   ANKLE PAIN, BILATERAL 07/28/2008   OPEN WOUND FT NO TOE ALONE WITHOUT MENTION COMP 03/28/2009   Bipolar 1 disorder, depressed, severe ([place]) 01/25/2018   Chronic right-sided low back pain without sciatica 01/11/2020   Chest congestion 11/20/2020   Past Medical History:  Diagnosis Date   Arthritis    Back pain, chronic    Bipolar 1 disorder ([place])    Diabetes mellitus without complication ([place])    IUD 2012  Review of Systems See HPI.     Objective:   Physical Exam Vitals reviewed.  Constitutional:      Appearance: Normal appearance.  HENT:     Head: Normocephalic.     Right Ear: Tympanic membrane, ear canal and external ear normal. There is no impacted cerumen.     Left Ear: Tympanic membrane, ear canal and external ear normal.  There is no impacted cerumen.     Nose: Nose normal.     Mouth/Throat:     Mouth: Mucous membranes are moist.  Eyes:     Conjunctiva/sclera: Conjunctivae normal.     Pupils: Pupils are equal, round, and reactive to light.  Cardiovascular:     Rate and Rhythm: Normal rate and regular rhythm.     Pulses: Normal pulses.     Heart sounds: Normal heart sounds.  Pulmonary:     Effort: Pulmonary effort is normal.     Breath sounds: Normal breath sounds.  Musculoskeletal:     Right lower leg: No edema.     Left lower leg: No edema.  Neurological:     General: No focal deficit present.     Mental Status: She is alert and oriented to person, place, and time.  Psychiatric:        Mood and Affect: Mood normal.          Assessment & Plan:  Marland KitchenMarland KitchenDehlia was seen today for ear fullness.  Diagnoses and all orders for this visit:  SOB (shortness of breath) on exertion -     albuterol (VENTOLIN HFA) 108 (90 Base) MCG/ACT inhaler; Take 2 puffs 15 minutes before exercise and as needed for shortness of breath.  Ear popping, bilateral -     predniSONE (DELTASONE) 50 MG tablet; Take 1 tablet (50 mg total) by mouth daily.  ETD (Eustachian tube dysfunction), bilateral -     predniSONE (DELTASONE) 50 MG tablet; Take 1 tablet (50 mg total) by mouth daily.  Peak flow was just in the yellow range but not by much. 6 minute walk test was normal. No desat on O2.  Start with albuterol 15 minutes before exercise and see if that improves SOB.  No signs of fluid overload.  Make sure you are eating and drinking when going to be active.  Follow up in 1-2 months.   ETD- reassured no infection today. Continue to use flonase. Going on for quite sometime. Continue allergy medications. Prednisone burst sent to help with symptoms today. Consider afrin every 3-4 days with flonase.

## 2021-08-21 ENCOUNTER — Other Ambulatory Visit: Payer: Self-pay | Admitting: Physician Assistant

## 2021-08-21 DIAGNOSIS — R0989 Other specified symptoms and signs involving the circulatory and respiratory systems: Secondary | ICD-10-CM

## 2021-08-21 DIAGNOSIS — J3489 Other specified disorders of nose and nasal sinuses: Secondary | ICD-10-CM

## 2021-08-21 DIAGNOSIS — J310 Chronic rhinitis: Secondary | ICD-10-CM

## 2021-08-21 MED ORDER — PREGABALIN 75 MG PO CAPS: 75.0000 mg | ORAL_CAPSULE | Freq: Two times a day (BID) | ORAL | 3 refills | Status: DC

## 2021-08-21 NOTE — Telephone Encounter (Signed)
I spent 5 total minutes of online digital evaluation and management services. 

## 2021-08-23 ENCOUNTER — Encounter: Payer: Self-pay | Admitting: Physician Assistant

## 2021-08-23 DIAGNOSIS — H6983 Other specified disorders of Eustachian tube, bilateral: Secondary | ICD-10-CM | POA: Insufficient documentation

## 2021-08-23 MED FILL — FAMOTIDINE 40 MG TABLET: ORAL | 60 days supply | Qty: 60 | Fill #4

## 2021-08-23 MED FILL — HYDROXYCHLOROQUINE 200 MG TABLET: ORAL | 30 days supply | Qty: 60 | Fill #5

## 2021-08-26 ENCOUNTER — Encounter: Payer: Self-pay | Admitting: Physician Assistant

## 2021-08-26 DIAGNOSIS — R0602 Shortness of breath: Secondary | ICD-10-CM

## 2021-08-27 MED ORDER — ALBUTEROL SULFATE HFA 108 (90 BASE) MCG/ACT IN AERS
INHALATION_SPRAY | RESPIRATORY_TRACT | 1 refills | Status: DC
Start: 2021-08-27 — End: 2024-09-13

## 2021-08-28 ENCOUNTER — Ambulatory Visit: Payer: Self-pay | Admitting: Pharmacist

## 2021-08-28 ENCOUNTER — Other Ambulatory Visit: Payer: Self-pay

## 2021-08-28 DIAGNOSIS — I10 Essential (primary) hypertension: Secondary | ICD-10-CM

## 2021-08-28 DIAGNOSIS — E785 Hyperlipidemia, unspecified: Secondary | ICD-10-CM

## 2021-08-28 DIAGNOSIS — E1165 Type 2 diabetes mellitus with hyperglycemia: Secondary | ICD-10-CM

## 2021-08-28 NOTE — Progress Notes (Signed)
Chronic Care Management Pharmacy Note  08/28/2021 Name:  Frances [name] MRN:  [id number] DOB:  1976-02-11  Summary: addressed HTN, HLD, DM. A1c doing well, tolerating ozempic without issues. Patient does wish to streamline medications and asked if she could reduce antacid medications.  Recommendations/Changes made from today's visit: Advised a trial period of stopping protonix per patient desire to reduce medications, and send [place] a message/call if experiencing issues prior to next visit. Recommend check lipid panel, CMP & urine microalbuminuria at next PCP visit.  Plan: f/u with pharmacist in 1 month  Subjective: Frances [name] is an 45 y.o. year old female who is a primary patient of Alden Hipp, Royetta Car, PA-C.  The CCM team was consulted for assistance with disease management and care coordination needs.    Engaged with patient face to face for follow up visit in response to provider referral for pharmacy case management and/or care coordination services.   Consent to Services:  The patient was given information about Chronic Care Management services, agreed to services, and gave verbal consent prior to initiation of services.  Please see initial visit note for detailed documentation.   Patient Care Team: Lavada Mesi as PCP - General (Family Medicine) Lona Millard, MD as Referring Physician (Rheumatology)  Objective:  Lab Results  Component Value Date   CREATININE 0.7 03/08/2021   CREATININE 0.8 02/20/2021   CREATININE 0.89 11/21/2020    Lab Results  Component Value Date   HGBA1C 5.6 06/17/2021       Component Value Date/Time   CHOL 130 04/14/2018 0822   CHOL 152 03/04/2016 1015   TRIG 192 (H) 04/14/2018 0822   HDL 28 (L) 04/14/2018 0822   HDL 37 (L) 03/04/2016 1015   CHOLHDL 4.6 04/14/2018 0822   LDLCALC 73 04/14/2018 0822    Hepatic Function Latest Ref Rng & Units 03/08/2021 02/20/2021 11/21/2020  Total Protein 6.0 - 8.5 g/dL - - 6.8  Albumin 3.5 -  5.0 3.6 4.4 4.4  AST 13 - 35 37(A) 36(A) 44(H)  ALT 7 - 35 58(A) 45(A) 51(H)  Alk Phosphatase 25 - 125 120 57 97  Total Bilirubin 0.0 - 1.2 mg/dL - - 0.6    Lab Results  Component Value Date/Time   TSH 0.866 11/21/2020 09:33 AM   TSH 1.616 01/27/2018 07:35 AM   TSH 0.967 09/14/2012 02:20 PM    CBC Latest Ref Rng & Units 03/08/2021 02/20/2021 11/21/2020  WBC - 6.6 6.7 5.8  Hemoglobin 12.0 - 16.0 13.9 14.4 14.3  Hematocrit 36 - 46 40 41 40.7  Platelets 150 - 399 252 220 272    No results found for: VD25OH  Clinical ASCVD: No  The 10-year ASCVD risk score Mikey Bussing DC Jr., et al., 2013) is: 1.8%   Values used to calculate the score:     Age: 27 years     Sex: Female     Is Non-Hispanic African American: No     Diabetic: Yes     Tobacco smoker: No     Systolic Blood Pressure: 035 mmHg     Is BP treated: No     HDL Cholesterol: 32 mg/dL     Total Cholesterol: 158 mg/dL     Social History   Tobacco Use  Smoking Status Never  Smokeless Tobacco Never   BP Readings from Last 3 Encounters:  08/20/21 103/69  08/01/21 110/78  07/05/21 118/80   Pulse Readings from Last 3 Encounters:  08/20/21 (!) 109  08/01/21 (!) 106  07/05/21 79   Wt Readings from Last 3 Encounters:  08/20/21 158 lb (71.7 kg)  08/01/21 180 lb (81.6 kg)  06/27/21 180 lb (81.6 kg)    Assessment: Review of patient past medical history, allergies, medications, health status, including review of consultants reports, laboratory and other test data, was performed as part of comprehensive evaluation and provision of chronic care management services.   SDOH:  (Social Determinants of Health) assessments and interventions performed:    CCM Care Plan  Allergies  Allergen Reactions   Metaxalone Hives and Other (See Comments)    Medications Reviewed Today     Reviewed by Darius Bump, [facility] (Pharmacist) on 08/28/21 at [place] List Status: <None>   Medication Order Taking? Sig Documenting Provider Last  Dose Status Informant  albuterol (VENTOLIN HFA) 108 (90 Base) MCG/ACT inhaler [id number] Yes Take 2 puffs 15 minutes before exercise and as needed for shortness of breath. Donella Stade, PA-C Taking Active   ARIPiprazole (ABILIFY) 15 MG tablet [id number] Yes Take 1 tablet (15 mg total) by mouth daily. Salley Slaughter, NP Taking Active   atorvastatin (LIPITOR) 10 MG tablet [id number] Yes Take 1 tablet (10 mg total) by mouth daily. Donella Stade, PA-C Taking Active   Azelastine HCl 137 MCG/SPRAY SOLN [id number] Yes PLACE 2 APPLICATORS INTO ALTERNATE NOSTRILS TWICE A DAY Breeback, Jade L, PA-C Taking Active   benztropine (COGENTIN) 0.5 MG tablet [id number] Yes Take 1 tablet (0.5 mg total) by mouth daily. Salley Slaughter, NP Taking Active   blood glucose meter kit and supplies KIT [id number] Yes Dispense based on patient and insurance preference. Use up to four times daily as directed. Donella Stade, PA-C Taking Active   buPROPion (WELLBUTRIN XL) 150 MG 24 hr tablet [id number] Yes Take 1 tablet (150 mg total) by mouth every morning. Salley Slaughter, NP Taking Active   buPROPion (WELLBUTRIN XL) 300 MG 24 hr tablet [id number] Yes Take 1 tablet (300 mg total) by mouth daily. Salley Slaughter, NP Taking Active   cholecalciferol (VITAMIN D3) 25 MCG (1000 UT) tablet [id number] Yes Take 1,000 Units by mouth daily. [provider] Taking Active   DULoxetine (CYMBALTA) 60 MG capsule [id number] Yes Take 60 mg by mouth daily. [provider] Taking Active   famotidine (PEPCID) 20 MG tablet [id number] Yes Take 20 mg by mouth daily. [provider] Taking Active   fluticasone (FLONASE) 50 MCG/ACT nasal spray [id number] Yes USE 2 SPRAYS IN EACH NOSTRIL EVERY DAY Breeback, Jade L, PA-C Taking Active   HYDROcodone-acetaminophen (NORCO/VICODIN) 5-325 MG tablet [id number] Yes Take 1 tablet by mouth 2 (two) times daily as needed for moderate pain (back pain). Silverio Decamp, MD  Taking Active   hydroxychloroquine (PLAQUENIL) 200 MG tablet [id number] Yes Take 200 mg by mouth in the morning and at bedtime. [provider] Taking Active   levocetirizine (XYZAL) 5 MG tablet [id number] Yes TAKE 1 TABLET BY MOUTH EVERY EVENING Breeback, Jade L, PA-C Taking Active   meloxicam (MOBIC) 15 MG tablet [id number] Yes Take 1 tablet by mouth daily. [provider] Taking Active   metFORMIN (GLUCOPHAGE) 1000 MG tablet [id number] Yes Take 1 tablet (1,000 mg total) by mouth 2 (two) times daily with a meal. Breeback, Jade L, PA-C Taking Active   methocarbamol (ROBAXIN) 500 MG tablet [id number] Yes TAKE 1 TABLET (500 MG TOTAL) BY MOUTH 3 (THREE) TIMES DAILY. Donella Stade, PA-C Taking Active   montelukast (  SINGULAIR) 10 MG tablet [id number] Yes TAKE 1 Tablet BY MOUTH ONCE EVERY NIGHT AT BEDTIME Donella Stade, PA-C Taking Active   pantoprazole (PROTONIX) 40 MG tablet [id number] Yes TAKE 1 TABLET BY MOUTH DAILY Breeback, Jade L, PA-C Taking Active   predniSONE (DELTASONE) 50 MG tablet [id number] Yes Take 1 tablet (50 mg total) by mouth daily. Donella Stade, PA-C Taking Active   pregabalin (LYRICA) 75 MG capsule [id number] Yes Take 1 capsule (75 mg total) by mouth 2 (two) times daily. Silverio Decamp, MD Taking Active   Semaglutide,0.25 or 0.5MG/DOS, (OZEMPIC, 0.25 OR 0.5 MG/DOSE,) 2 MG/1.5ML SOPN [id number] Yes Inject 0.25 mg into the skin once a week.  Patient taking differently: Inject 0.5 mg into the skin once a week.   Donella Stade, PA-C Taking Active   Med List Note [name], CPhT 02/20/17 1343): Pt uses [place] med assist             Patient Active Problem List   Diagnosis Date Noted   ETD (Eustachian tube dysfunction), bilateral 08/23/2021   SOB (shortness of breath) on exertion 08/20/2021   Ear popping, bilateral 08/20/2021   Overweight (BMI 25.0-29.9) 06/18/2021   Attention deficit hyperactivity disorder (ADHD), predominantly inattentive type  04/24/2021   Hyperlipidemia LDL goal <70 03/18/2021   Type 2 diabetes mellitus with hyperglycemia, without long-term current use of insulin ([place]) 03/13/2021   Seasonal allergies 12/25/2020   Sinus drainage 11/20/2020   Bilateral leg edema 11/19/2020   Costochondritis 10/22/2020   Rib pain on right side 10/22/2020   Tachycardia 10/22/2020   Hepatomegaly 10/22/2020   Symptomatic mammary hypertrophy 09/04/2020   Xiphoid pain 08/13/2020   Sternum pain 08/13/2020   Class 1 obesity due to excess calories without serious comorbidity with body mass index (BMI) of 32.0 to 32.9 in adult 08/13/2020   Macromastia 06/29/2020   Recurrent major depressive disorder, in partial remission ([place]) 05/24/2020   Generalized anxiety disorder 05/24/2020   DDD (degenerative disc disease), cervical 03/13/2020   Ingrowing left great toenail 03/13/2020   Labral tear of hip, degenerative 01/11/2020   Grief reaction 11/28/2019   Toenail fungus 09/09/2019   Vasovagal response 09/09/2019   Decreased visual acuity 08/30/2018   Right corneal abrasion 08/30/2018   Elevated fasting glucose 04/08/2018   Depression    Anxiety 01/22/2018   Borderline personality disorder ([place]) 01/22/2018   Rheumatoid arthritis 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 ([place]) 12/09/2017   TMJ (temporomandibular joint syndrome) 09/26/2017   New daily persistent headache 09/26/2017   MTHFR gene mutation 09/20/2017   Bipolar I disorder ([place]) 09/16/2017   Fibromyalgia 09/16/2017   Chronic fatigue syndrome 09/16/2017   DYSPNEA 10/15/2011   Back pain 10/13/2008   DERMATITIS, ALLERGIC 06/05/2008   FATIGUE 05/05/2008   Hypothyroidism 02/10/2008   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   DDD (degenerative disc disease), lumbar 02/10/2008   FIBROMYALGIA 02/10/2008   IRON, SERUM, ELEVATED 02/10/2008    Immunization History  Administered Date(s) Administered   Influenza Whole 08/08/2011   Influenza,inj,Quad PF,6+ Mos 10/13/2018,  09/09/2019, 08/13/2020   Influenza-Unspecified 03/04/2016, 09/09/2019, 08/13/2020   Moderna Sars-Covid-2 Vaccination 10/02/2020, 10/30/2020   Pneumococcal Conjugate-13 04/07/2018   Pneumococcal Polysaccharide-23 12/09/2018   Td 02/19/2006   Tdap 02/04/2018    Conditions to be addressed/monitored: HTN, HLD, and DMII  Care Plan : Medication Management  Updates made by Darius Bump, [place] since 08/28/2021 12:00 AM     Problem: DM, HTN, HLD  Long-Range Goal: Disease Progression Prevention   Start Date: 05/30/2021  Recent Progress: On track  Priority: High  Note:   Current Barriers:  None at present  Pharmacist Clinical Goal(s):  Over the next 30 days, patient will maintain control of diabetes as evidenced by medication fill history & lab values through collaboration with PharmD and provider.   Interventions: 1:1 collaboration with Donella Stade, PA-C regarding development and update of comprehensive plan of care as evidenced by provider attestation and co-signature Inter-disciplinary care team collaboration (see longitudinal plan of care) Comprehensive medication review performed; medication list updated in electronic medical record  Diabetes:  Uncontrolled:  current treatment:Metformin 1g BID, ozempic 0.27m weekly; a1c 5.6  Current glucose readings: 100s-120s fasting, 140s postprandial  Denies hypoglycemic/hyperglycemic symptoms  Current meal patterns: B: fiber one + atkins, L: soft pretzel, D: yogurt, protein drinks, Snacks: peanuts, protein bars, Drink:  water (+ flavoring)   Current exercise: taking care of horses, riding  Previously educated on new start ozempic, how to administer, storage for pens, needle/sharps disposal, and expected side effects as well as dose titration Recommended continue current medications,   Hypertension:  Controlled; current treatment: previously lisinopril 562mbut not currently taking d/t low BP;  Current home readings: SBP  80-90s  Denies hypotensive/hypertensive symptoms  Recommended continue current regimen and   Hyperlipidemia:  Controlled; current treatment:atorvastatin 1073m  Recommended continue current regimen, obtain fasting lipid panel & CMP at next visit   Patient Goals/Self-Care Activities Over the next 30 days, patient will:  take medications as prescribed and check glucose in AM prior to eating, and 2hr after a meal, document, and provide at future appointments  Follow Up Plan: Face to Face appointment with care management team member scheduled for: 1 month     Medication Assistance:  Ozempic obtained through [employer] assistance program.  Enrollment ends 2022  [facility]  [facility] [place]one: [phone]: [phone] of [address] Lance[address] [place] [address]one: [phone]: [phone] ParkC [place]one: [phone]: [phone] Up:  Patient agrees to Care Plan and Follow-up.  Plan: Face to Face appointment with care management team member scheduled for: 1 month  KeeDarius Bump

## 2021-08-28 NOTE — Patient Instructions (Signed)
Visit Information  PATIENT GOALS:  Goals Addressed             This Visit's Progress    Medication Management       Patient Goals/Self-Care Activities Over the next 30 days, patient will:  take medications as prescribed and check glucose in AM prior to eating, and 2hr after a meal, document, and provide at future appointments  Follow Up Plan: Face to Face appointment with care management team member scheduled for: 1 month        Patient verbalizes understanding of instructions provided today and agrees to view in [place].   Face to Face appointment with care management team member scheduled for:  1 month  Frances [name]

## 2021-09-02 ENCOUNTER — Ambulatory Visit (INDEPENDENT_AMBULATORY_CARE_PROVIDER_SITE_OTHER): Payer: No Payment, Other | Admitting: Clinical

## 2021-09-02 DIAGNOSIS — F319 Bipolar disorder, unspecified: Secondary | ICD-10-CM | POA: Diagnosis not present

## 2021-09-02 NOTE — Progress Notes (Signed)
   THERAPIST PROGRESS NOTE Virtual Visit via Video Note  I connected with Para March on 09/02/21 at 10:00 AM EDT by a video enabled telemedicine application and verified that I am speaking with the correct person using two identifiers.  Location: Patient: home Provider: office   I discussed the limitations of evaluation and management by telemedicine and the availability of in person appointments. The patient expressed understanding and agreed to proceed.   Follow Up Instructions: I discussed the assessment and treatment plan with the patient. The patient was provided an opportunity to ask questions and all were answered. The patient agreed with the plan and demonstrated an understanding of the instructions.   The patient was advised to call back or seek an in-person evaluation if the symptoms worsen or if the condition fails to improve as anticipated.   Session Time: 30 minutes  Participation Level: Active  Behavioral Response: CasualAlertDepressed  Type of Therapy: Individual Therapy  Treatment Goals addressed: Coping  Interventions: CBT and Supportive  Summary:  Frances [name] is a 45 [name].o. female who presents for the scheduled session oriented x5, appropriately dressed, and friendly.  Client denied hallucinations and delusions. Client reported on today that she is feeling fairly well.  Client reported since she was last seen she has attended group with the [employer] virtually and will be meeting with her peers support tomorrow.  Client reported today she has plans to ride her horse at the saddle club with a friend.  Client reported last Saturday she had a day of oversleeping which is not too common for her.  Client reported she did complete some chores but continuously was in and out of sleep during the day. Client reported she is not sure if it is related to her prescription of Lyrica. Client engaged with the therapist to discuss the previous session  homework. Client reported it was hard trying to journal a response for "how do I see myself" and "who do I want to be".  Client reported she is not sure why she cannot brainstorm a response.  Client reported she also has not yet followed through on the previously assigned homework of replacing a negative thought for positive client.  Client reported the only barrier that she can foresee is that she feels unmotivated to make the attempt.  Client reported her peers support and group is also suggested some homework assignments to attempt but she has not followed through on those either.     Suicidal/Homicidal: Nowithout intent/plan  Therapist Response:  Therapist began the appointment asking the client how she has been doing since last seen. Therapist used CBT to utilize active listening and positive emotional support towards her thoughts and feelings. Therapist used CBT to engage with the client to review her attempt to complete homework previously assigned. Therapist used CBT to engage with the client to ask her to identify what her barriers are for completing homework assignment. Therapist assigned the client homework to choose 1 to 2 days to make appointment to complete the previously discussed homework. Client was scheduled for next appointment.     Plan: Return again in 6 weeks.  Diagnosis: Bipolar 1 disorder  Frances [name] Shannon Kirkendall, LCSW 09/02/2021

## 2021-09-06 ENCOUNTER — Ambulatory Visit (INDEPENDENT_AMBULATORY_CARE_PROVIDER_SITE_OTHER): Payer: Self-pay | Admitting: Specialist

## 2021-09-06 ENCOUNTER — Other Ambulatory Visit: Payer: Self-pay

## 2021-09-06 ENCOUNTER — Encounter: Payer: Self-pay | Admitting: Specialist

## 2021-09-06 VITALS — BP 111/79 | HR 103 | Ht 67.0 in | Wt 158.0 lb

## 2021-09-06 DIAGNOSIS — M5126 Other intervertebral disc displacement, lumbar region: Secondary | ICD-10-CM

## 2021-09-06 DIAGNOSIS — M4807 Spinal stenosis, lumbosacral region: Secondary | ICD-10-CM

## 2021-09-06 DIAGNOSIS — M5136 Other intervertebral disc degeneration, lumbar region: Secondary | ICD-10-CM

## 2021-09-06 DIAGNOSIS — M48062 Spinal stenosis, lumbar region with neurogenic claudication: Secondary | ICD-10-CM
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
## 2021-09-06 NOTE — Patient Instructions (Signed)
Plan: Avoid bending, stooping and avoid lifting weights greater than 10 lbs. Avoid prolong standing and walking. Avoid frequent bending and stooping  No lifting greater than 10 lbs. May use ice or moist heat for pain. Weight loss is of benefit. Handicap license is approved. Myelogram and post myelogram CT lumbar spine with standing flexion and extension radiographs Lyrica  and mobic.

## 2021-09-10 ENCOUNTER — Other Ambulatory Visit: Payer: Self-pay | Admitting: Physician Assistant

## 2021-09-10 DIAGNOSIS — J302 Other seasonal allergic rhinitis: Secondary | ICD-10-CM

## 2021-09-10 DIAGNOSIS — J3489 Other specified disorders of nose and nasal sinuses: Secondary | ICD-10-CM

## 2021-09-10 DIAGNOSIS — E785 Hyperlipidemia, unspecified: Secondary | ICD-10-CM

## 2021-09-10 DIAGNOSIS — R0989 Other specified symptoms and signs involving the circulatory and respiratory systems: Secondary | ICD-10-CM

## 2021-09-11 ENCOUNTER — Ambulatory Visit: Admit: 2021-09-11 | Discharge: 2021-09-12

## 2021-09-11 DIAGNOSIS — M1990 Unspecified osteoarthritis, unspecified site: Principal | ICD-10-CM

## 2021-09-11 DIAGNOSIS — Z23 Encounter for immunization: Principal | ICD-10-CM

## 2021-09-11 MED ORDER — MELOXICAM 15 MG TABLET
ORAL_TABLET | Freq: Every day | ORAL | 6 refills | 30.00000 days | Status: CP
Start: 2021-09-11 — End: 2022-09-11

## 2021-09-13 ENCOUNTER — Telehealth: Payer: Self-pay | Admitting: Specialist

## 2021-09-17 ENCOUNTER — Ambulatory Visit (INDEPENDENT_AMBULATORY_CARE_PROVIDER_SITE_OTHER): Payer: Self-pay | Admitting: Physician Assistant

## 2021-09-17 ENCOUNTER — Encounter: Payer: Self-pay | Admitting: Physician Assistant

## 2021-09-17 VITALS — BP 116/77 | HR 103 | Ht 67.0 in | Wt 161.0 lb

## 2021-09-17 DIAGNOSIS — E663 Overweight: Secondary | ICD-10-CM

## 2021-09-17 DIAGNOSIS — E11649 Type 2 diabetes mellitus with hypoglycemia without coma: Secondary | ICD-10-CM

## 2021-09-17 DIAGNOSIS — E1165 Type 2 diabetes mellitus with hyperglycemia: Secondary | ICD-10-CM

## 2021-09-17 LAB — POCT GLYCOSYLATED HEMOGLOBIN (HGB A1C): Hemoglobin A1C: 4.9 % (ref 4.0–5.6)

## 2021-09-17 LAB — POCT UA - MICROALBUMIN
Albumin/Creatinine Ratio, Urine, POC: <30
Creatinine, POC: 200 mg/dL
Microalbumin Ur, POC: 10 mg/L

## 2021-09-17 NOTE — Progress Notes (Signed)
Subjective:    Patient ID: Frances [name], female    DOB: 08/12/1976, 45 y.o.   MRN: [id number]  HPI  Patient is a 45 y/o female presents for a 3 month follow up. Patient states she is eating healthy and exercising a few times a week riding her pony. Patient states she is taking her medications regularly. She continues to keep to healthy diet and losing weight. She wants to know what goal weight is. She is not checking sugars. She is having some hypoglycemic like events where she feels better after eating.  She does not have any open sores or wounds. No CP, palpitations, headaches or vision changes. She states her ear popping and SOB from 07/2021 have improved.   .. 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 ([place]) 12/09/2017   Anxiety 01/22/2018   Borderline personality disorder ([place]) 01/22/2018   Depression    Elevated fasting glucose 04/08/2018   Decreased visual acuity 08/30/2018   Right corneal abrasion 08/30/2018   Toenail fungus 09/09/2019   Vasovagal response 09/09/2019   Grief reaction 11/28/2019   Labral tear of hip, degenerative 01/11/2020   DDD (degenerative disc disease), cervical 03/13/2020   Ingrowing left great toenail 03/13/2020   Recurrent major depressive disorder, in partial remission ([place]) 05/24/2020   Generalized anxiety disorder 05/24/2020   Macromastia 06/29/2020    Xiphoid pain 08/13/2020   Sternum pain 08/13/2020   Class 1 obesity due to excess calories without serious comorbidity with body mass index (BMI) of 32.0 to 32.9 in adult 08/13/2020   Symptomatic mammary hypertrophy 09/04/2020   Costochondritis 10/22/2020   Rib pain on right side 10/22/2020   Tachycardia 10/22/2020   Hepatomegaly 10/22/2020   Bilateral leg edema 11/19/2020   Sinus drainage 11/20/2020   Seasonal allergies 12/25/2020   Type 2 diabetes mellitus with hyperglycemia, without long-term current use of insulin ([place]) 03/13/2021   Hyperlipidemia LDL goal <70 03/18/2021   Attention deficit hyperactivity disorder (ADHD), 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   Overweight (BMI 25.0-29.9) 06/18/2021   SOB (shortness of breath) on exertion 08/20/2021   Ear popping, bilateral 08/20/2021   ETD (Eustachian tube dysfunction), bilateral 08/23/2021   Resolved Ambulatory Problems    Diagnosis Date Noted   FEVER, RECURRENT 04/02/2009   SINUSITIS- ACUTE-NOS 03/24/2008   URI 01/17/2009   ANKLE PAIN, BILATERAL 07/28/2008   OPEN WOUND FT NO TOE ALONE WITHOUT MENTION COMP 03/28/2009   Bipolar 1 disorder, depressed, severe ([place]) 01/25/2018   Chronic right-sided low back pain without sciatica 01/11/2020   Chest congestion 11/20/2020   Past Medical History:  Diagnosis Date   Arthritis    Back pain, chronic    Bipolar 1 disorder ([place])    Diabetes mellitus without complication ([place])    IUD 2012     Review of Systems See HPI.     Objective:   Physical Exam Vitals reviewed.  Constitutional:      Appearance: Normal appearance.  HENT:     Head: Normocephalic.  Neck:     Vascular: No carotid bruit.  Cardiovascular:     Rate and Rhythm: Normal rate and regular rhythm.     Pulses: Normal pulses.     Heart sounds: Normal heart sounds.  Pulmonary:     Effort: Pulmonary effort is normal.     Breath sounds: Normal breath sounds.  Musculoskeletal:     Right lower leg: No edema.      Left lower leg: No edema.  Neurological:     General: No focal deficit present.     Mental Status: She is alert.  Psychiatric:        Mood and Affect: Mood normal.    .. Depression screen [facility] 2/9 09/17/2021 04/18/2021 03/27/2020  Decreased Interest 2 0 2  Down, Depressed, Hopeless 1 1 1   PHQ - 2 Score 3 1 3   Altered sleeping 2 - 1  Tired, decreased energy 2 - 3  Change in appetite 1 - 2  Feeling bad or failure about yourself  1 - 1  Trouble concentrating 2 - 2  Moving slowly or fidgety/restless 0 - 0  Suicidal thoughts 0 - 1  PHQ-9 Score 11 - 13  Difficult doing work/chores Somewhat difficult - Very difficult  Some encounter information is confidential and restricted. Go to Review Flowsheets activity to see all data.  Some recent data might be hidden   .[name] GAD 7 : Generalized Anxiety Score 09/17/2021 11/28/2019 09/09/2019 05/11/2019  Nervous, Anxious, on Edge 0 1 0 0  Control/stop worrying 0 0 0 0  Worry too much - different things 1 1 1 1   Trouble relaxing 1 1 0 0  Restless 0 0 1 0  Easily annoyed or irritable 0 1 0 1  Afraid - awful might happen 0 0 0 0  Total GAD 7 Score 2 4 2 2   Anxiety Difficulty Not difficult at all Somewhat difficult Not difficult at all Somewhat difficult  Some encounter information is confidential and restricted. Go to Review Flowsheets activity to see all data.          Assessment & Plan:  Marland KitchenMarland KitchenAmiliah was seen today for diabetes.  Diagnoses and all orders for this visit:  Type 2 diabetes mellitus with hypoglycemia without coma, without long-term current use of insulin (HCC) -     POCT glycosylated hemoglobin (Hb A1C) -     POCT UA - Microalbumin  Overweight (BMI 25.0-29.9)   A1C to goal.  Continue same medications.  Weight is good. BMI still in overweight category.  Discussed small more frequent meals to help with any sugar lows.  Ok range 140-160.  Negative microalbumin BP to goal.  On statin Needs eye exam Foot exam UTD.  Covid  vaccine x3 Flu shot UTD.  Pneumonia vaccine UTD.  Follow up in 3 months.   Marland KitchenVernetta Honey PA-C, have reviewed and agree with the above documentation in it's entirety.

## 2021-09-17 NOTE — Patient Instructions (Addendum)
Dr. Darene Lamer 01/31/2020 -04/19/2021 for lumbar degenerative disc disease.  Healthy weight is 140-160.

## 2021-09-18 ENCOUNTER — Encounter: Payer: Self-pay | Admitting: Physician Assistant

## 2021-09-18 ENCOUNTER — Telehealth (HOSPITAL_COMMUNITY): Payer: Self-pay | Admitting: Licensed Clinical Social Worker

## 2021-09-18 MED FILL — CYMBALTA 30 MG CAPSULE,DELAYED RELEASE: ORAL | 30 days supply | Qty: 90 | Fill #1

## 2021-09-18 NOTE — Telephone Encounter (Signed)
Patient contacted office requesting for therapists to write a letter for her SSI for time off from work. Please call patient to discuss.

## 2021-09-19 ENCOUNTER — Other Ambulatory Visit: Payer: Self-pay

## 2021-09-19 ENCOUNTER — Ambulatory Visit
Admission: RE | Admit: 2021-09-19 | Discharge: 2021-09-19 | Disposition: A | Discharge status: Home / Self Care | Payer: Self-pay | Source: Ambulatory Visit | Attending: Specialist | Admitting: Specialist

## 2021-09-19 DIAGNOSIS — M5126 Other intervertebral disc displacement, lumbar region: Secondary | ICD-10-CM

## 2021-09-19 DIAGNOSIS — M5136 Other intervertebral disc degeneration, lumbar region: Secondary | ICD-10-CM

## 2021-09-19 DIAGNOSIS — M4807 Spinal stenosis, lumbosacral region: Secondary | ICD-10-CM

## 2021-09-19 DIAGNOSIS — M48062 Spinal stenosis, lumbar region with neurogenic claudication: Secondary | ICD-10-CM

## 2021-09-19 MED ORDER — IOPAMIDOL (ISOVUE-M 200) INJECTION 41%
20.0000 mL | Freq: Once | INTRAMUSCULAR | Status: AC
  Administered 2021-09-19: 20 mL via INTRATHECAL

## 2021-09-19 MED ORDER — ONDANSETRON HCL 4 MG/2ML IJ SOLN: 4.0000 mg | Freq: Once | INTRAMUSCULAR | Status: DC | PRN

## 2021-09-19 MED ORDER — MEPERIDINE HCL 50 MG/ML IJ SOLN
50.0000 mg | Freq: Once | INTRAMUSCULAR | Status: DC | PRN
Start: 2021-09-19 — End: 2021-09-20

## 2021-09-19 MED ORDER — DIAZEPAM 5 MG PO TABS: 10.0000 mg | ORAL_TABLET | Freq: Once | ORAL | Status: DC

## 2021-09-19 MED ORDER — DIAZEPAM 5 MG PO TABS
5.0000 mg | ORAL_TABLET | Freq: Once | ORAL | Status: AC
  Administered 2021-09-19: 5 mg via ORAL

## 2021-09-19 NOTE — Discharge Instructions (Signed)
Myelogram Discharge Instructions  Go home and rest quietly as needed. You may resume normal activities; however, do not exert yourself strongly or do any heavy lifting today and tomorrow.   DO NOT drive today.    You may resume your normal diet and medications unless otherwise indicated. Drink a lot of extra fluids today and tomorrow.   The incidence of a spinal headache is about 5% (one in 20 patients).  If you develop a headache when you are sitting up or standing that gets better when you lie down, please lie flat for 24 hours and drink plenty of fluids until the headache goes away.  Caffeinated beverages may be helpful.   If you develop severe nausea and vomiting or a headache that does not go away with the flat bedrest after 48 hours, please call [phone].   Call your physician for a follow-up appointment.  The results of your myelogram will be sent directly to your physician by the following day.  Please call us at [phone] if you have any questions or if complications develop after you arrive home.   Discharge instructions have been explained to the patient.  The patient, or the person responsible for the patient, state they fully understands these instructions.   Thank you for visiting our office today.

## 2021-09-20 ENCOUNTER — Ambulatory Visit (INDEPENDENT_AMBULATORY_CARE_PROVIDER_SITE_OTHER): Payer: Self-pay | Admitting: Sports Medicine

## 2021-09-20 ENCOUNTER — Ambulatory Visit (INDEPENDENT_AMBULATORY_CARE_PROVIDER_SITE_OTHER): Payer: Self-pay

## 2021-09-20 DIAGNOSIS — M24159 Other articular cartilage disorders, unspecified hip: Secondary | ICD-10-CM

## 2021-09-20 NOTE — Assessment & Plan Note (Signed)
This is a pleasant 45 year old female, known labral tear from an MR arthrogram several years ago, increasing pain right hip, we will do a steroid injection today into the hip joint, adding some hip labral tear conditioning, return to see me in about a month, if insufficient improvement we will get a referral to the hip center. I would like some updated baseline x-rays.

## 2021-09-20 NOTE — Progress Notes (Signed)
    Procedures performed today:    Procedure: Real-time Ultrasound Guided injection of the right hip joint Device: Samsung HS60  Verbal informed consent obtained.  Time-out conducted.  Noted no overlying erythema, induration, or other signs of local infection.  Skin prepped in a sterile fashion.  Local anesthesia: Topical Ethyl chloride.  With sterile technique and under real time ultrasound guidance: Noted minimally degenerative changes and synovitis, 1 cc Kenalog 40, 2 cc lidocaine, 2 cc bupivacaine injected easily Completed without difficulty  Advised to call if fevers/chills, erythema, induration, drainage, or persistent bleeding.  Images permanently stored and available for review in PACS.  Impression: Technically successful ultrasound guided injection.  Independent interpretation of notes and tests performed by another provider:   None.  Brief History, Exam, Impression, and Recommendations:    Labral tear of hip, degenerative This is a pleasant 45 year old female, known labral tear from an MR arthrogram several years ago, increasing pain right hip, we will do a steroid injection today into the hip joint, adding some hip labral tear conditioning, return to see me in about a month, if insufficient improvement we will get a referral to the hip center. I would like some updated baseline x-rays.    ___________________________________________ Gwen Her. Dianah Field, M.D., ABFM., CAQSM. Primary Care and [place] Instructor of [place] of [facility]

## 2021-09-30 ENCOUNTER — Telehealth: Payer: Self-pay | Admitting: Specialist

## 2021-09-30 NOTE — Telephone Encounter (Signed)
Pt was suppose to be scheduled on the 21st but i'm guessing it was never put in? She had to reschedule out to 10/24/2021, but pt has court on 11/04. She needs to be seen sooner than one day before court. Can you work her in sooner?  Cb [phone]

## 2021-10-03 ENCOUNTER — Encounter: Payer: Self-pay | Admitting: Physician Assistant

## 2021-10-03 DIAGNOSIS — M5137 Other intervertebral disc degeneration, lumbosacral region: Secondary | ICD-10-CM

## 2021-10-03 DIAGNOSIS — M0609 Rheumatoid arthritis without rheumatoid factor, multiple sites: Secondary | ICD-10-CM

## 2021-10-03 DIAGNOSIS — M5136 Other intervertebral disc degeneration, lumbar region: Secondary | ICD-10-CM

## 2021-10-03 DIAGNOSIS — G8929 Other chronic pain: Secondary | ICD-10-CM

## 2021-10-04 MED ORDER — HYDROCODONE-ACETAMINOPHEN 5-325 MG PO TABS: 1.0000 | ORAL_TABLET | Freq: Two times a day (BID) | ORAL | 0 refills | Status: DC | PRN

## 2021-10-07 ENCOUNTER — Ambulatory Visit (INDEPENDENT_AMBULATORY_CARE_PROVIDER_SITE_OTHER): Payer: Self-pay | Admitting: Specialist

## 2021-10-07 ENCOUNTER — Encounter: Payer: Self-pay | Admitting: Specialist

## 2021-10-07 ENCOUNTER — Other Ambulatory Visit: Payer: Self-pay

## 2021-10-07 VITALS — BP 129/82 | HR 111 | Ht 67.0 in | Wt 156.0 lb

## 2021-10-07 DIAGNOSIS — M0609 Rheumatoid arthritis without rheumatoid factor, multiple sites: Secondary | ICD-10-CM

## 2021-10-07 DIAGNOSIS — M51369 Other intervertebral disc degeneration, lumbar region without mention of lumbar back pain or lower extremity pain: Secondary | ICD-10-CM

## 2021-10-07 DIAGNOSIS — G8929 Other chronic pain: Secondary | ICD-10-CM

## 2021-10-07 DIAGNOSIS — M4807 Spinal stenosis, lumbosacral region: Secondary | ICD-10-CM

## 2021-10-07 DIAGNOSIS — M4726 Other spondylosis with radiculopathy, lumbar region: Secondary | ICD-10-CM

## 2021-10-07 DIAGNOSIS — M545 Low back pain, unspecified: Secondary | ICD-10-CM

## 2021-10-07 DIAGNOSIS — M25551 Pain in right hip: Secondary | ICD-10-CM

## 2021-10-07 DIAGNOSIS — M5136 Other intervertebral disc degeneration, lumbar region: Secondary | ICD-10-CM

## 2021-10-07 DIAGNOSIS — M51379 Other intervertebral disc degeneration, lumbosacral region without mention of lumbar back pain or lower extremity pain: Secondary | ICD-10-CM

## 2021-10-07 DIAGNOSIS — M5137 Other intervertebral disc degeneration, lumbosacral region: Secondary | ICD-10-CM
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
## 2021-10-07 NOTE — Patient Instructions (Signed)
Plan: Avoid bending, stooping and avoid lifting weights greater than 10 lbs. Avoid prolong standing and walking. Avoid frequent bending and stooping  No lifting greater than 10 lbs. May use ice or moist heat for pain. Weight loss is of benefit. Handicap license is approved. Dr. Romona Curls secretary/Assistant will call to arrange for facet joint blocks with possible later Radiofrequency ablation of the median nerves at L3-4 and L4-5.

## 2021-10-08 MED ORDER — HYDROCODONE-ACETAMINOPHEN 5-325 MG PO TABS: 1.0000 | ORAL_TABLET | Freq: Two times a day (BID) | ORAL | 0 refills | Status: DC | PRN

## 2021-10-08 NOTE — Addendum Note (Signed)
Addended by: Donella Stade on: 10/08/2021 02:36 PM   Modules accepted: Orders

## 2021-10-09 ENCOUNTER — Ambulatory Visit: Payer: Self-pay | Admitting: Pharmacist

## 2021-10-09 DIAGNOSIS — E1165 Type 2 diabetes mellitus with hyperglycemia: Secondary | ICD-10-CM

## 2021-10-09 DIAGNOSIS — I10 Essential (primary) hypertension: Secondary | ICD-10-CM

## 2021-10-09 DIAGNOSIS — E785 Hyperlipidemia, unspecified: Secondary | ICD-10-CM

## 2021-10-09 NOTE — Progress Notes (Signed)
Chronic Care Management Pharmacy Note  10/09/2021 Name:  Frances [name] MRN:  [id number] DOB:  09/03/1976  Summary: addressed HTN, HLD, DM. A1c doing extremely well (4.9), tolerating ozempic without issues.  Recommendations/Changes made from today's visit: Continue current regimen! Recommend check lipid panel at next PCP visit.  Plan: f/u with pharmacist in 6 months  Subjective: Frances [name] is an 45 y.o. year old female who is a primary patient of Breeback, Jade L, PA-C.  The CCM team was consulted for assistance with disease management and care coordination needs.    Engaged with patient face to face for follow up visit in response to provider referral for pharmacy case management and/or care coordination services.   Consent to Services:  The patient was given information about Chronic Care Management services, agreed to services, and gave verbal consent prior to initiation of services.  Please see initial visit note for detailed documentation.   Patient Care Team: Breeback, Jade L, PA-C as PCP - General (Family Medicine) Lusa, Amanda L, MD as Referring Physician (Rheumatology)  Objective:  Lab Results  Component Value Date   CREATININE 0.7 03/08/2021   CREATININE 0.8 02/20/2021   CREATININE 0.89 11/21/2020    Lab Results  Component Value Date   HGBA1C 4.9 09/17/2021       Component Value Date/Time   CHOL 130 04/14/2018 0822   CHOL 152 03/04/2016 1015   TRIG 192 (H) 04/14/2018 0822   HDL 28 (L) 04/14/2018 0822   HDL 37 (L) 03/04/2016 1015   CHOLHDL 4.6 04/14/2018 0822   LDLCALC 73 04/14/2018 0822    Hepatic Function Latest Ref Rng & Units 03/08/2021 02/20/2021 11/21/2020  Total Protein 6.0 - 8.5 g/dL - - 6.8  Albumin 3.5 - 5.0 3.6 4.4 4.4  AST 13 - 35 37(A) 36(A) 44(H)  ALT 7 - 35 58(A) 45(A) 51(H)  Alk Phosphatase 25 - 125 120 57 97  Total Bilirubin 0.0 - 1.2 mg/dL - - 0.6    Lab Results  Component Value Date/Time   TSH 0.866 11/21/2020 09:33 AM    TSH 1.616 01/27/2018 07:35 AM   TSH 0.967 09/14/2012 02:20 PM    CBC Latest Ref Rng & Units 03/08/2021 02/20/2021 11/21/2020  WBC - 6.6 6.7 5.8  Hemoglobin 12.0 - 16.0 13.9 14.4 14.3  Hematocrit 36 - 46 40 41 40.7  Platelets 150 - 399 252 220 272    No results found for: VD25OH  Clinical ASCVD: No  The 10-year ASCVD risk score (Arnett DK, et al., 2019) is: 2.8%   Values used to calculate the score:     Age: 45 years     Sex: Female     Is Non-Hispanic African American: No     Diabetic: Yes     Tobacco smoker: No     Systolic Blood Pressure: 129 mmHg     Is BP treated: No     HDL Cholesterol: 32 mg/dL     Total Cholesterol: 158 mg/dL     Social History   Tobacco Use  Smoking Status Never  Smokeless Tobacco Never   BP Readings from Last 3 Encounters:  10/07/21 129/82  09/19/21 113/80  09/17/21 116/77   Pulse Readings from Last 3 Encounters:  10/07/21 (!) 111  09/19/21 81  09/17/21 (!) 103   Wt Readings from Last 3 Encounters:  10/07/21 156 lb (70.8 kg)  09/17/21 161 lb (73 kg)  09/06/21 158 lb (71.7 kg)    Assessment: Review of patient   past medical history, allergies, medications, health status, including review of consultants reports, laboratory and other test data, was performed as part of comprehensive evaluation and provision of chronic care management services.   SDOH:  (Social Determinants of Health) assessments and interventions performed:    CCM Care Plan  Allergies  Allergen Reactions   Metaxalone Hives    Medications Reviewed Today     Reviewed by ,  J, RPH (Pharmacist) on 10/09/21 at 1411  Med List Status: <None>   Medication Order Taking? Sig Documenting Provider Last Dose Status Informant  albuterol (VENTOLIN HFA) 108 (90 Base) MCG/ACT inhaler [id number] Yes Take 2 puffs 15 minutes before exercise and as needed for shortness of breath. Breeback, Jade L, PA-C Taking Active   ARIPiprazole (ABILIFY) 15 MG tablet [id number] Yes Take 1  tablet (15 mg total) by mouth daily. Parsons, Brittney E, NP Taking Active   atorvastatin (LIPITOR) 10 MG tablet [id number] Yes Take 1 tablet (10 mg total) by mouth daily. Needs labs Breeback, Jade L, PA-C Taking Active   Azelastine HCl 137 MCG/SPRAY SOLN [id number] Yes PLACE 2 APPLICATORS INTO ALTERNATE NOSTRILS TWICE A DAY Breeback, Jade L, PA-C Taking Active   benztropine (COGENTIN) 0.5 MG tablet [id number] Yes Take 1 tablet (0.5 mg total) by mouth daily. Parsons, Brittney E, NP Taking Active   blood glucose meter kit and supplies KIT [id number] Yes Dispense based on patient and insurance preference. Use up to four times daily as directed. Breeback, Jade L, PA-C Taking Active   buPROPion (WELLBUTRIN XL) 150 MG 24 hr tablet [id number] Yes Take 1 tablet (150 mg total) by mouth every morning. Parsons, Brittney E, NP Taking Active   buPROPion (WELLBUTRIN XL) 300 MG 24 hr tablet [id number] Yes Take 1 tablet (300 mg total) by mouth daily. Parsons, Brittney E, NP Taking Active   cholecalciferol (VITAMIN D3) 25 MCG (1000 UT) tablet [id number] Yes Take 1,000 Units by mouth daily. [provider] Taking Active   DULoxetine HCl 30 MG CSDR [id number] Yes Take 90 mg by mouth daily. [provider] Taking Active   famotidine (PEPCID) 20 MG tablet [id number] Yes Take 20 mg by mouth daily. [provider] Taking Active   fluticasone (FLONASE) 50 MCG/ACT nasal spray [id number] Yes USE 2 SPRAYS IN EACH NOSTRIL EVERY DAY Breeback, Jade L, PA-C Taking Active   HYDROcodone-acetaminophen (NORCO/VICODIN) 5-325 MG tablet [id number] Yes Take 1 tablet by mouth 2 (two) times daily as needed for moderate pain (back pain). Breeback, Jade L, PA-C Taking Active   levocetirizine (XYZAL) 5 MG tablet [id number] Yes TAKE 1 TABLET BY MOUTH EVERY EVENING Breeback, Jade L, PA-C Taking Active   meloxicam (MOBIC) 15 MG tablet [id number] Yes Take 1 tablet by mouth daily. [provider] Taking Active   metFORMIN  (GLUCOPHAGE) 1000 MG tablet [id number] Yes Take 1 tablet (1,000 mg total) by mouth 2 (two) times daily with a meal. Breeback, Jade L, PA-C Taking Active   methocarbamol (ROBAXIN) 500 MG tablet [id number] Yes TAKE 1 TABLET (500 MG TOTAL) BY MOUTH 3 (THREE) TIMES DAILY. Breeback, Jade L, PA-C Taking Active   montelukast (SINGULAIR) 10 MG tablet [id number] Yes TAKE 1 Tablet BY MOUTH ONCE EVERY NIGHT AT BEDTIME Breeback, Jade L, PA-C Taking Active   pregabalin (LYRICA) 75 MG capsule [id number] Yes Take 1 capsule (75 mg total) by mouth 2 (two) times daily. Thekkekandam, Thomas J, MD Taking Active   Semaglutide,0.25 or 0.5MG/DOS, (OZEMPIC, 0.25 OR 0.5 MG/DOSE,) 2 MG/1.5ML SOPN [id number] Yes Inject 0.25   mg into the skin once a week.  Patient taking differently: Inject 0.5 mg into the skin once a week.   Breeback, Jade L, PA-C Taking Active   Med List Note (Fisher, Kyle A, CPhT 02/20/17 1343): Pt uses [employer] med assist             Patient Active Problem List   Diagnosis Date Noted   ETD (Eustachian tube dysfunction), bilateral 08/23/2021   SOB (shortness of breath) on exertion 08/20/2021   Ear popping, bilateral 08/20/2021   Overweight (BMI 25.0-29.9) 06/18/2021   Attention deficit hyperactivity disorder (ADHD), 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   Hyperlipidemia LDL goal <70 03/18/2021   Type 2 diabetes mellitus with hyperglycemia, without long-term current use of insulin (HCC) 03/13/2021   Seasonal allergies 12/25/2020   Sinus drainage 11/20/2020   Bilateral leg edema 11/19/2020   Costochondritis 10/22/2020   Rib pain on right side 10/22/2020   Tachycardia 10/22/2020   Hepatomegaly 10/22/2020   Symptomatic mammary hypertrophy 09/04/2020   Xiphoid pain 08/13/2020   Sternum pain 08/13/2020   Class 1 obesity due to excess calories without serious comorbidity with body mass index (BMI) of 32.0 to 32.9 in adult 08/13/2020   Macromastia 06/29/2020   Recurrent major depressive disorder, in  partial remission (HCC) 05/24/2020   Generalized anxiety disorder 05/24/2020   DDD (degenerative disc disease), cervical 03/13/2020   Ingrowing left great toenail 03/13/2020   Labral tear of hip, degenerative 01/11/2020   Grief reaction 11/28/2019   Toenail fungus 09/09/2019   Vasovagal response 09/09/2019   Decreased visual acuity 08/30/2018   Right corneal abrasion 08/30/2018   Elevated fasting glucose 04/08/2018   Depression    Anxiety 01/22/2018   Borderline personality disorder (HCC) 01/22/2018   Rheumatoid arthritis 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 (HCC) 12/09/2017   TMJ (temporomandibular joint syndrome) 09/26/2017   New daily persistent headache 09/26/2017   MTHFR gene mutation 09/20/2017   Bipolar I disorder (HCC) 09/16/2017   Fibromyalgia 09/16/2017   Chronic fatigue syndrome 09/16/2017   DYSPNEA 10/15/2011   Back pain 10/13/2008   DERMATITIS, ALLERGIC 06/05/2008   FATIGUE 05/05/2008   Hypothyroidism 02/10/2008   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   DDD (degenerative disc disease), lumbar 02/10/2008   FIBROMYALGIA 02/10/2008   IRON, SERUM, ELEVATED 02/10/2008    Immunization History  Administered Date(s) Administered   Influenza Whole 08/08/2011   Influenza,inj,Quad PF,6+ Mos 10/13/2018, 09/09/2019, 08/13/2020, 09/11/2021   Influenza-Unspecified 03/04/2016, 09/09/2019, 08/13/2020   Moderna Sars-Covid-2 Vaccination 10/02/2020, 10/30/2020, 04/16/2021   Pneumococcal Conjugate-13 04/07/2018   Pneumococcal Polysaccharide-23 12/09/2018   Td 02/19/2006   Tdap 02/04/2018    Conditions to be addressed/monitored: HTN, HLD, and DMII  Care Plan : Medication Management  Updates made by ,  J, RPH since 10/09/2021 12:00 AM     Problem: DM, HTN, HLD      Long-Range Goal: Disease Progression Prevention   Start Date: 05/30/2021  Recent Progress: On track  Priority: High  Note:   Current Barriers:  None at present  Pharmacist Clinical  Goal(s):  Over the next 180 days, patient will maintain control of diabetes as evidenced by medication fill history & lab values through collaboration with PharmD and provider.   Interventions: 1:1 collaboration with Breeback, Jade L, PA-C regarding development and update of comprehensive plan of care as evidenced by provider attestation and co-signature Inter-disciplinary care team collaboration (see longitudinal plan of care) Comprehensive medication review performed; medication list updated in electronic medical record  Diabetes:  Uncontrolled:    current treatment:Metformin 1g BID, ozempic 0.5mg weekly; a1c 4.9   Current glucose readings: 100s-110s fasting, hasn't been checking postprandial (previously 140s)  Denies hypoglycemic/hyperglycemic symptoms  Current meal patterns: B: fiber one + atkins, L: soft pretzel, D: yogurt, protein drinks, Snacks: peanuts, protein bars, Drink:  water (+ flavoring)   Current exercise: taking care of horses, riding  Previously educated on new start ozempic, how to administer, storage for pens, needle/sharps disposal, and expected side effects as well as dose titration Recommended continue current medications,   Hypertension:  Controlled; current treatment: previously lisinopril 5mg but was stopped d/t low BP;  Current home readings: SBP 100-110s, doing well  Denies hypotensive/hypertensive symptoms  Recommended continue current regimen and   Hyperlipidemia:  Controlled; current treatment:atorvastatin 10mg;   Recommended continue current regimen, obtain fasting lipid panel at next visit   Patient Goals/Self-Care Activities Over the next 180 days, patient will:  take medications as prescribed and check glucose in AM prior to eating, and 2hr after a meal, document, and provide at future appointments  Follow Up Plan: Face to Face appointment with care management team member scheduled for: 6 months      Medication Assistance:  Ozempic obtained  through [employer] medication assistance program.  Enrollment ends 2022  Patient's preferred pharmacy is:  [id number] - [place], [place] - [address] [place] [address] Phone: [phone] Fax: [phone]  [facility] - [place], [place] - [address] [place] [address] Phone: [phone] Fax: [phone]  [id number] - [place], [place] - [address] [place]  [address] Phone: [phone] Fax: [phone]   Follow Up:  Patient agrees to Care Plan and Follow-up.  Plan: Face to Face appointment with care management team member scheduled for: 6 months   , PharmD Clinical Pharmacist [facility] [phone]       

## 2021-10-09 NOTE — Patient Instructions (Signed)
Visit Information  PATIENT GOALS:  Goals Addressed             This Visit's Progress    Medication Management       Patient Goals/Self-Care Activities Over the next 180 days, patient will:  take medications as prescribed and check glucose in AM prior to eating, and 2hr after a meal, document, and provide at future appointments  Follow Up Plan: Face to Face appointment with care management team member scheduled for: 6 months         Patient verbalizes understanding of instructions provided today and agrees to view in [place].   Face to Face appointment with care management team member scheduled for:  6 months

## 2021-10-16 ENCOUNTER — Telehealth: Payer: Self-pay | Admitting: Specialist

## 2021-10-16 NOTE — Telephone Encounter (Signed)
Patient called asked if Dr. Louanne Skye finished the letter he was writing for her? Patient asked if it's ready at the front desk  [phone]

## 2021-10-17 NOTE — Telephone Encounter (Signed)
Patient's 2nd attempt. Please advise on letter and what it needs to states.

## 2021-10-17 NOTE — Telephone Encounter (Signed)
Pt called back checking on the letter. She states she needs it no later than 10/18/21 and if possible would like to pick it up today. Pt would like a CB.   [phone]

## 2021-10-18 ENCOUNTER — Ambulatory Visit: Payer: Self-pay | Admitting: Specialist

## 2021-10-18 ENCOUNTER — Ambulatory Visit (INDEPENDENT_AMBULATORY_CARE_PROVIDER_SITE_OTHER): Payer: Self-pay | Admitting: Sports Medicine

## 2021-10-18 DIAGNOSIS — M24159 Other articular cartilage disorders, unspecified hip: Secondary | ICD-10-CM

## 2021-10-18 MED FILL — HYDROXYCHLOROQUINE 200 MG TABLET: ORAL | 30 days supply | Qty: 60 | Fill #6

## 2021-10-18 MED FILL — FAMOTIDINE 40 MG TABLET: ORAL | 60 days supply | Qty: 60 | Fill #5

## 2021-10-18 MED FILL — CYMBALTA 30 MG CAPSULE,DELAYED RELEASE: ORAL | 30 days supply | Qty: 90 | Fill #2

## 2021-10-18 NOTE — Progress Notes (Signed)
    Procedures performed today:    None.  Independent interpretation of notes and tests performed by another provider:   None.  Brief History, Exam, Impression, and Recommendations:    Labral tear of hip, degenerative, right [name] returns, she is a pleasant 45 year old female, known labral tear from an MR arthrogram performed several years ago at an outside facility, we did a steroid injection into her right hip joint at the last visit and she has had good relief. I will write a letter supporting her SSD. Return as needed.    ___________________________________________ Gwen Her. Dianah Field, M.D., ABFM., CAQSM. Primary Care and [place] Instructor of [employer]

## 2021-10-18 NOTE — Assessment & Plan Note (Signed)
Frances [name] returns, she is a pleasant 45 year old female, known labral tear from an MR arthrogram performed several years ago at an outside facility, we did a steroid injection into her right hip joint at the last visit and she has had good relief. I will write a letter supporting her SSD. Return as needed.

## 2021-10-21 ENCOUNTER — Ambulatory Visit (INDEPENDENT_AMBULATORY_CARE_PROVIDER_SITE_OTHER): Payer: No Payment, Other | Admitting: Clinical

## 2021-10-21 DIAGNOSIS — F319 Bipolar disorder, unspecified: Secondary | ICD-10-CM

## 2021-10-24 ENCOUNTER — Ambulatory Visit: Payer: Self-pay | Admitting: Specialist

## 2021-10-26 NOTE — Progress Notes (Signed)
   THERAPIST PROGRESS NOTE Virtual Visit via Video Note  I connected with Frances [name] on 10/21/2021 at  3:00 PM EDT by a video enabled telemedicine application and verified that I am speaking with the correct person using two identifiers.  Location: Patient: home Provider: office   I discussed the limitations of evaluation and management by telemedicine and the availability of in person appointments. The patient expressed understanding and agreed to proceed.   Follow Up Instructions: I discussed the assessment and treatment plan with the patient. The patient was provided an opportunity to ask questions and all were answered. The patient agreed with the plan and demonstrated an understanding of the instructions.   The patient was advised to call back or seek an in-person evaluation if the symptoms worsen or if the condition fails to improve as anticipated.   Session Time: 40 minutes  Participation Level: Active  Behavioral Response: CasualAlertDepressed  Type of Therapy: Individual Therapy  Treatment Goals addressed: Coping  Interventions: CBT and Supportive  Summary:  Frances [name] is a 44 y.o. female who presents for the scheduled session oriented x5, appropriately dressed, and friendly.  Client denied hallucinations and delusions. Client reported on today that she has been doing fairly well but still struggling with symptoms of depression.  Client reported her depression is also affected by seasonal changes.  Client reported no issues at this time with suicidal ideations but does struggle to perform self hygiene activity and household chores.  Client reported her difficulty also completing task is related to physical ailments.  Client reported difficulty with remembering to do things and lack of interest in activities.  Client reported her household chores do not get done often.  Client reported she lives in an older trailer and there are also some structural issues.   Client reported she is able to manage her depressive symptoms well by caring for her pets.  Client reported she has upcoming Social Security hearing on this Friday which will be conducted by telephone.  Client reported her lawyer instructed her to write a letter detailing how her mental health and physical health affect her to read to the judge.  Client reported she is nervous about the outcome.  Client reported she continues to work with her peers support but forgets to do some of the activities that she is also instructed to do to practice improving her depression.  Client reported her medication is working well with no complaints.    Suicidal/Homicidal: Nowithout intent/plan  Therapist Response:  Therapist began the appointment asking client how she has been doing since last seen. Therapist used CBT to utilize active listening and positive emotional support. Therapist used CBT about previous suggestions that she practiced. Therapist used CBT to reinforce utilizing activities such as journaling, breaking a task into smaller ones, and having good self-care/personal hygiene regimens. Therapist assigned to client homework to follow through on suggestions made by peers support as well as what was discussed during the session. Client was scheduled for next appointment.    Plan: Return again in 5 weeks.  Diagnosis: Bipolar 1 disorder  Birdena Jubilee Mozella Rexrode, LCSW 10/21/2021

## 2021-10-28 ENCOUNTER — Encounter (HOSPITAL_COMMUNITY): Payer: Self-pay | Admitting: Psychiatry

## 2021-10-28 ENCOUNTER — Other Ambulatory Visit: Payer: Self-pay

## 2021-10-28 ENCOUNTER — Telehealth (INDEPENDENT_AMBULATORY_CARE_PROVIDER_SITE_OTHER): Payer: No Payment, Other | Admitting: Psychiatry

## 2021-10-28 DIAGNOSIS — F319 Bipolar disorder, unspecified: Secondary | ICD-10-CM

## 2021-10-28 DIAGNOSIS — F900 Attention-deficit hyperactivity disorder, predominantly inattentive type: Secondary | ICD-10-CM | POA: Diagnosis not present

## 2021-10-28 MED ORDER — BENZTROPINE MESYLATE 0.5 MG PO TABS: 0.5000 mg | ORAL_TABLET | Freq: Every day | ORAL | 3 refills | Status: DC

## 2021-10-28 MED ORDER — BUPROPION HCL ER (XL) 150 MG PO TB24: 150.0000 mg | ORAL_TABLET | ORAL | 3 refills | Status: DC

## 2021-10-28 MED ORDER — BUPROPION HCL ER (XL) 300 MG PO TB24: 300.0000 mg | ORAL_TABLET | Freq: Every day | ORAL | 3 refills | Status: DC

## 2021-10-28 MED ORDER — ARIPIPRAZOLE 15 MG PO TABS: 15.0000 mg | ORAL_TABLET | Freq: Every day | ORAL | 3 refills | Status: DC

## 2021-10-28 NOTE — Progress Notes (Signed)
BH MD/PA/NP OP Progress Note Virtual Visit via Video Note  I connected with Frances [name] on 10/28/21 at  1:30 PM EST by a video enabled telemedicine application and verified that I am speaking with the correct person using two identifiers.  Location: Patient: Home Provider: Clinic   I discussed the limitations of evaluation and management by telemedicine and the availability of in person appointments. The patient expressed understanding and agreed to proceed.  I provided 30 minutes of non-face-to-face time during this encounter.        07/25/2021 1:15 PM Frances [name]  MRN:  [id number]  Chief Complaint: "I'm doing okay"  HPI: 45 year old female seen today for follow up psychiatric evaluation. She has a psychiatric history of bipolar 1, borderline personality disorder, depression, and anxiety. She is currently managed on Abilify 15 mg nightly, Cogentin 0.5 mg daily, Wellbutrin 450 mg daily, and Cymbalta 90 mg daily (from rheumatologist). Today, patient notes that medications are effective in managing her symptoms.   Today the patient was well groomed, pleasant, cooperative, engaged in conversation, and maintained eye contact.  She informed provider that since her last visit she has been doing okay.  She informed Probation officer that her mood is stable and notes that she has minimal anxiety and depression.  Provider conducted a GAD-7 and patient scored a 3, at her last visit she scored a 5.  Provider also conducted PHQ-9 and patient scored a 9, at her last visit she scored a 12.  She notes that at times she suffers from hypersomnia noting that she sleeps 10 hours nightly.  She informed Probation officer that her appetite fluctuates.  Patient notes that since January she has lost 50 pounds.  At her last visit she informed writer that she had lost 35 pounds.  Today she denies SI/HI/VAH, mania, or paranoia.    Patient informed Probation officer that she has arthritis and fibromyalgia which causes her to be in  constant pain.  She informed Probation officer that her rheumatologist increased her Cymbalta to 90 mg and started her on Lyrica.  She reports her gabapentin was discontinued and notes that the new regimen is somewhat effective in managing her pain.  Patient also notes that her diabetes is well managed.  She notes that at her last primary care visit her A1c was in normal limits.  Patient notes that since increasing her Cymbalta her concentration has improved.  No medication changes made today.  Patient agreeable to continue medication as prescribed.  She notes that she may be interested in a nonstimulant in the future to help manage concentration.  She will follow-up with outpatient counseling for therapy.  No other concerns noted at this time.     Visit Diagnosis:    ICD-10-CM   1. Bipolar I disorder (HCC)  F31.9 ARIPiprazole (ABILIFY) 15 MG tablet    benztropine (COGENTIN) 0.5 MG tablet    buPROPion (WELLBUTRIN XL) 150 MG 24 hr tablet    buPROPion (WELLBUTRIN XL) 300 MG 24 hr tablet    2. Attention deficit hyperactivity disorder (ADHD), predominantly inattentive type  F90.0 buPROPion (WELLBUTRIN XL) 150 MG 24 hr tablet    buPROPion (WELLBUTRIN XL) 300 MG 24 hr tablet      Past Psychiatric History: Bipolar, anxiety, and depression Past Medical History:  Past Medical History:  Diagnosis Date   Anxiety    Arthritis    Back pain, chronic    Bipolar 1 disorder (HCC)    Chronic fatigue syndrome    DDD (  degenerative disc disease), lumbar    Depression    Diabetes mellitus without complication ([place])    Fibromyalgia    IUD 2012   Mirena   MTHFR gene mutation     Past Surgical History:  Procedure Laterality Date   COLONOSCOPY WITH PROPOFOL N/A 02/25/2017   Procedure: COLONOSCOPY WITH PROPOFOL;  Surgeon: Arta Silence, MD;  Location: [facility];  Service: Endoscopy;  Laterality: N/A;   left knee lateral release  1993   scar tisue removal  03/2005   left ankle   TONSILLECTOMY  age 33's     Family Psychiatric History: Maternal aunts Bipolar disorder and uncle alcoholic  Family History:  Family History  Problem Relation Age of Onset   Diabetes Mother    Stroke Mother    Lupus Mother    Hypertension Mother    Congestive Heart Failure Mother    Mental illness Brother        Not clear what his diagnosis is--may be related to previous drug use.   Cancer Maternal Grandmother        colon   Depression Maternal Uncle    Alcohol abuse Maternal Uncle    Diabetes Maternal Grandfather     Social History:  Social History   Socioeconomic History   Marital status: Single    Spouse name: Not on file   Number of children: 0   Years of education: Not on file   Highest education level: Bachelor's degree (e.g., BA, AB, BS)  Occupational History   Occupation: Designer, industrial/product at times  Tobacco Use   Smoking status: Never   Smokeless tobacco: Never  Vaping Use   Vaping Use: Never used  Substance and Sexual Activity   Alcohol use: No    Alcohol/week: 0.0 standard drinks   Drug use: No   Sexual activity: Yes    Birth control/protection: I.U.D.  Other Topics Concern   Not on file  Social History Narrative   Originally from [place], outside of [place].    Moved here permanently 2012.   Intermittently worked on a farm.   Lives with many cats--3 plus fosters cats.    Single, lives alone in a one story home. Rarely drinks caffeine. 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   Social Determinants of Health   Financial Resource Strain: Medium Risk   Difficulty of Paying Living Expenses: Somewhat hard  Food Insecurity: No Food Insecurity   Worried About Charity fundraiser in the Last Year: Never true   Ran Out of Food in the Last Year: Never true  Transportation Needs: No Transportation Needs   Lack of Transportation (Medical): No   Lack of Transportation (Non-Medical): No  Physical Activity: Inactive   Days of Exercise per Week: 0 days   Minutes  of Exercise per Session: 0 min  Stress: No Stress Concern Present   Feeling of Stress : Not at all  Social Connections: Moderately Isolated   Frequency of Communication with Friends and Family: More than three times a week   Frequency of Social Gatherings with Friends and Family: More than three times a week   Attends Religious Services: Never   Marine scientist or Organizations: Yes   Attends Music therapist: More than 4 times per year   Marital Status: Never married    Allergies:  Allergies  Allergen Reactions   Metaxalone Hives and Other (See Comments)    Metabolic Disorder Labs: Lab Results  Component Value Date  HGBA1C 5.6 06/17/2021   MPG 103 04/14/2018   No results found for: PROLACTIN Lab Results  Component Value Date   CHOL 130 04/14/2018   TRIG 192 (H) 04/14/2018   HDL 28 (L) 04/14/2018   CHOLHDL 4.6 04/14/2018   LDLCALC 73 04/14/2018   LDLCALC 99 03/04/2016   Lab Results  Component Value Date   TSH 0.866 11/21/2020   TSH 1.616 01/27/2018    Therapeutic Level Labs: No results found for: LITHIUM No results found for: VALPROATE No components found for:  CBMZ  Current Medications: Current Outpatient Medications  Medication Sig Dispense Refill   ARIPiprazole (ABILIFY) 15 MG tablet Take 1 tablet (15 mg total) by mouth daily. 30 tablet 3   atorvastatin (LIPITOR) 10 MG tablet Take 1 tablet (10 mg total) by mouth daily. 90 tablet 1   Azelastine HCl 137 MCG/SPRAY SOLN PLACE 2 APPLICATORS INTO ALTERNATE NOSTRILS TWICE A DAY 30 mL 2   benztropine (COGENTIN) 0.5 MG tablet Take 1 tablet (0.5 mg total) by mouth daily. 30 tablet 3   blood glucose meter kit and supplies KIT Dispense based on patient and insurance preference. Use up to four times daily as directed. 1 each 0   buPROPion (WELLBUTRIN XL) 150 MG 24 hr tablet Take 1 tablet (150 mg total) by mouth every morning. 30 tablet 3   buPROPion (WELLBUTRIN XL) 300 MG 24 hr tablet Take 1 tablet  (300 mg total) by mouth daily. 30 tablet 3   cholecalciferol (VITAMIN D3) 25 MCG (1000 UT) tablet Take 1,000 Units by mouth daily.     DULoxetine (CYMBALTA) 60 MG capsule Take 60 mg by mouth daily.     famotidine (PEPCID) 20 MG tablet Take 20 mg by mouth daily.     fluticasone (FLONASE) 50 MCG/ACT nasal spray USE 2 SPRAYS IN EACH NOSTRIL EVERY DAY 16 g 1   gabapentin (NEURONTIN) 800 MG tablet TAKE 1 TABLET (800 MG TOTAL) BY MOUTH 3 (THREE) TIMES DAILY. 90 tablet 3   hydrochlorothiazide (HYDRODIURIL) 12.5 MG tablet Take 1 tablet (12.5 mg total) by mouth daily. (Patient not taking: Reported on 05/29/2021) 30 tablet 2   HYDROcodone-acetaminophen (NORCO/VICODIN) 5-325 MG tablet Take 1 tablet by mouth 2 (two) times daily as needed for moderate pain (back pain). 60 tablet 0   hydroxychloroquine (PLAQUENIL) 200 MG tablet Take 200 mg by mouth in the morning and at bedtime.     levocetirizine (XYZAL) 5 MG tablet TAKE 1 TABLET BY MOUTH EVERY EVENING 90 tablet 1   lisinopril (ZESTRIL) 5 MG tablet Take 1 tablet (5 mg total) by mouth daily. 90 tablet 1   meloxicam (MOBIC) 15 MG tablet Take 1 tablet by mouth daily. (Patient not taking: Reported on 05/29/2021)     metFORMIN (GLUCOPHAGE) 1000 MG tablet Take 1 tablet (1,000 mg total) by mouth 2 (two) times daily with a meal. 180 tablet 1   methocarbamol (ROBAXIN) 500 MG tablet Take 1 tablet (500 mg total) by mouth 3 (three) times daily. 90 tablet 1   montelukast (SINGULAIR) 10 MG tablet TAKE 1 Tablet BY MOUTH ONCE EVERY NIGHT AT BEDTIME 90 tablet 1   pantoprazole (PROTONIX) 40 MG tablet TAKE 1 TABLET BY MOUTH DAILY 90 tablet 1   Semaglutide,0.25 or 0.5MG /DOS, (OZEMPIC, 0.25 OR 0.5 MG/DOSE,) 2 MG/1.5ML SOPN Inject 0.25 mg into the skin once a week. 1.5 mL 0   No current facility-administered medications for this visit.     Musculoskeletal: Strength & Muscle Tone:  Unable to assess  due to telehealth visit [place]:  Unable to assess due to telehealth  visit Patient leans: N/A  Psychiatric Specialty Exam: Review of Systems  There were no vitals taken for this visit.There is no height or weight on file to calculate BMI.  General Appearance: Well Groomed  Eye Contact:  Good  Speech:  Clear and Coherent and Normal Rate  Volume:  Normal  Mood:  Euthymic  Affect:  Congruent  Thought Process:  Coherent, Goal Directed and Linear  Orientation:  Full (Time, Place, and Person)  Thought Content: WDL and Logical   Suicidal Thoughts:  No  Homicidal Thoughts:  No  Memory:  Immediate;   Good Recent;   Good Remote;   Good  Judgement:  Good  Insight:  Good  Psychomotor Activity:  Normal  Concentration:  Concentration: Good and Attention Span: Good  Recall:  Good  Fund of Knowledge: Good  Language: Good  Akathisia:  No  Handed:  Right  AIMS (if indicated): Not done  Assets:  Communication Skills Desire for Improvement Financial Resources/Insurance Housing Social Support  ADL's:  Intact  Cognition: WNL  Sleep:  Good   Screenings: AIMS    Flowsheet Row Admission (Discharged) from 01/25/2018 in [facility]  AIMS Total Score 0      AUDIT    Flowsheet Row Admission (Discharged) from 01/25/2018 in [facility]  Alcohol Use Disorder Identification Test Final Score (AUDIT) 0      GAD-7    Flowsheet Row Video Visit from 07/25/2021 in [facility] Video Visit from 04/24/2021 in [facility] Video Visit from 01/24/2021 in [facility] Counselor from 12/06/2020 in [facility] Counselor from 10/04/2020 in [facility]  Total GAD-7 Score 5 3 3 3 3       PHQ2-9    Flowsheet Row Video Visit from 07/25/2021 in [facility] Counselor from 06/18/2021 in [facility] Video Visit from 04/24/2021  in [facility] Nutrition from 04/18/2021 in [facility] Video Visit from 01/24/2021 in [facility]  PHQ-2 Total Score 3 4 3 1 3   PHQ-9 Total Score 12 15 11  -- [place] [facility] from 05/05/2021 in [facility] from 01/30/2021 in [place] No Risk        Assessment and Plan: Patient notes that her mood, anxiety, and depression has improved since her last visit. No medication changes made today.  Patient agreeable to continue medication as prescribed.  She notes that she may be interested in a nonstimulant in the future to help manage concentration. 1. Bipolar I disorder (HCC)  Continue- ARIPiprazole (ABILIFY) 15 MG tablet; Take 1 tablet (15 mg total) by mouth daily.  Dispense: 30 tablet; Refill: 3 Continue- benztropine (COGENTIN) 0.5 MG tablet; Take 1 tablet (0.5 mg total) by mouth daily.  Dispense: 30 tablet; Refill: 3 Continue- buPROPion (WELLBUTRIN XL) 300 MG 24 hr tablet; Take 1 tablet (300 mg total) by mouth daily.  Dispense: 30 tablet; Refill: 3 Continue- buPROPion (WELLBUTRIN XL) 150 MG 24 hr tablet; Take 1 tablet (150 mg total) by mouth every morning.  Dispense: 30 tablet; Refill: 3  2. Attention deficit hyperactivity disorder (ADHD), predominantly inattentive type  Continue- buPROPion [facility]  XL) 300 MG 24 hr tablet; Take 1 tablet (300 mg total) by mouth daily.  Dispense: 30 tablet; Refill: 3 Continue- buPROPion (WELLBUTRIN XL) 150 MG 24 hr tablet; Take 1 tablet (150 mg total) by mouth every morning.  Dispense: 30 tablet; Refill: 3   Follow up in 3 moths Follow up with therapy   Salley Slaughter, NP 07/25/2021, 1:15 PM

## 2021-11-11 ENCOUNTER — Other Ambulatory Visit: Payer: Self-pay | Admitting: Physician Assistant

## 2021-11-18 ENCOUNTER — Encounter: Payer: Self-pay | Admitting: Physician Assistant

## 2021-11-18 DIAGNOSIS — M5136 Other intervertebral disc degeneration, lumbar region: Secondary | ICD-10-CM

## 2021-11-18 DIAGNOSIS — M5137 Other intervertebral disc degeneration, lumbosacral region: Secondary | ICD-10-CM

## 2021-11-18 DIAGNOSIS — M0609 Rheumatoid arthritis without rheumatoid factor, multiple sites: Secondary | ICD-10-CM

## 2021-11-18 DIAGNOSIS — M25551 Pain in right hip: Secondary | ICD-10-CM

## 2021-11-18 DIAGNOSIS — G8929 Other chronic pain: Secondary | ICD-10-CM

## 2021-11-18 DIAGNOSIS — M0600 Rheumatoid arthritis without rheumatoid factor, unspecified site: Principal | ICD-10-CM

## 2021-11-18 MED ORDER — HYDROCODONE-ACETAMINOPHEN 5-325 MG PO TABS: 1.0000 | ORAL_TABLET | Freq: Two times a day (BID) | ORAL | 0 refills | Status: DC | PRN

## 2021-11-21 ENCOUNTER — Ambulatory Visit: Payer: Self-pay | Admitting: Specialist

## 2021-11-21 ENCOUNTER — Telehealth: Payer: Self-pay | Admitting: Physical Medicine and Rehabilitation

## 2021-11-21 MED ORDER — HYDROXYCHLOROQUINE 200 MG TABLET
ORAL_TABLET | Freq: Two times a day (BID) | ORAL | 6 refills | 30 days | Status: CP
Start: 2021-11-21 — End: 2022-11-21
  Filled 2021-11-21: qty 60, 30d supply, fill #0

## 2021-11-21 MED FILL — CYMBALTA 30 MG CAPSULE,DELAYED RELEASE: ORAL | 30 days supply | Qty: 90 | Fill #3

## 2021-11-21 NOTE — Telephone Encounter (Signed)
Pt called stating she would like to set an appt. She stated she received her 100% con letter from financial assistance and will bring with her when she has her appt. Please call pt at [phone].

## 2021-11-25 NOTE — Telephone Encounter (Signed)
Patient has been scheduled for 12/10/2021

## 2021-12-04 ENCOUNTER — Other Ambulatory Visit: Payer: Self-pay | Admitting: Physician Assistant

## 2021-12-04 DIAGNOSIS — E1165 Type 2 diabetes mellitus with hyperglycemia: Secondary | ICD-10-CM

## 2021-12-11 ENCOUNTER — Ambulatory Visit (INDEPENDENT_AMBULATORY_CARE_PROVIDER_SITE_OTHER): Payer: Self-pay | Admitting: Physical Medicine and Rehabilitation

## 2021-12-11 ENCOUNTER — Other Ambulatory Visit: Payer: Self-pay | Admitting: Physician Assistant

## 2021-12-11 ENCOUNTER — Encounter: Payer: Self-pay | Admitting: Physical Medicine and Rehabilitation

## 2021-12-11 ENCOUNTER — Ambulatory Visit: Payer: Self-pay

## 2021-12-11 ENCOUNTER — Other Ambulatory Visit: Payer: Self-pay

## 2021-12-11 VITALS — BP 100/70 | HR 97

## 2021-12-11 DIAGNOSIS — E785 Hyperlipidemia, unspecified: Secondary | ICD-10-CM

## 2021-12-11 DIAGNOSIS — M47816 Spondylosis without myelopathy or radiculopathy, lumbar region: Secondary | ICD-10-CM

## 2021-12-11 MED ORDER — BUPIVACAINE HCL 0.5 % IJ SOLN
3.0000 mL | Freq: Once | INTRAMUSCULAR | Status: AC
  Administered 2021-12-11: 09:00:00 3 mL

## 2021-12-11 NOTE — Progress Notes (Signed)
Pt state lower back pain that travels to her left hip ans buttocks. Pt state standing, walking and bending makes the pain worse. Pt state she takes pain meds and lay in bed to help ease her pain.  Numeric Pain Rating Scale and Functional Assessment Average Pain 3   In the last MONTH (on 0-10 scale) has pain interfered with the following?  1. General activity like being  able to carry out your everyday physical activities such as walking, climbing stairs, carrying groceries, or moving a chair?  Rating(9)   +Driver, -BT, -Dye Allergies.

## 2021-12-11 NOTE — Patient Instructions (Signed)

## 2021-12-12 ENCOUNTER — Ambulatory Visit: Payer: Self-pay | Admitting: Specialist

## 2021-12-17 ENCOUNTER — Encounter: Payer: Self-pay | Admitting: Physician Assistant

## 2021-12-17 ENCOUNTER — Other Ambulatory Visit: Payer: Self-pay

## 2021-12-17 ENCOUNTER — Ambulatory Visit (INDEPENDENT_AMBULATORY_CARE_PROVIDER_SITE_OTHER): Payer: Self-pay | Admitting: Physician Assistant

## 2021-12-17 VITALS — BP 118/73 | HR 98 | Ht 67.0 in | Wt 149.3 lb

## 2021-12-17 DIAGNOSIS — Z1231 Encounter for screening mammogram for malignant neoplasm of breast: Secondary | ICD-10-CM

## 2021-12-17 DIAGNOSIS — E119 Type 2 diabetes mellitus without complications: Secondary | ICD-10-CM

## 2021-12-17 LAB — POCT GLYCOSYLATED HEMOGLOBIN (HGB A1C): Hemoglobin A1C: 5.1 % (ref 4.0–5.6)

## 2021-12-17 NOTE — Progress Notes (Addendum)
Subjective:    Patient ID: Frances [name], female    DOB: 06/02/1976, 45 y.o.   MRN: [id number]  HPI Pt is a 45 yo female with T2DM, CFS, Anxiety, MDD, RA, ADHD who presents to the clinic for medication follow up.   Pt is not checking her morning sugars all the time but she is checking occasionally. She is taking ozempic .5mg  weekly. She is losing weight. Lost 7 more lbs in this 3 month segment. She admits she does not have much of appetite. She continues on metformin. No open sores or wounds. No hypoglycemic events.   Mood is doing pretty good no concerns.   .. 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   DERMATITIS, ALLERGIC 06/05/2008   DDD (degenerative disc disease), lumbar 02/10/2008   Back pain 10/13/2008   FIBROMYALGIA 02/10/2008   FATIGUE 05/05/2008   IRON, SERUM, ELEVATED 02/10/2008   DYSPNEA 10/15/2011   Bipolar I disorder ([place]) 09/16/2017   Fibromyalgia 09/16/2017   Chronic fatigue syndrome 09/16/2017   MTHFR gene mutation 09/20/2017   TMJ (temporomandibular joint syndrome) 09/26/2017   New daily persistent headache 09/26/2017   Rheumatoid arthritis 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 ([place]) 12/09/2017   Anxiety 01/22/2018   Borderline personality disorder ([place]) 01/22/2018   Depression    Elevated fasting glucose 04/08/2018   Decreased visual acuity 08/30/2018   Right corneal abrasion 08/30/2018   Toenail fungus 09/09/2019   Vasovagal response 09/09/2019   Grief reaction 11/28/2019   Labral tear of hip, degenerative, right 01/11/2020   DDD (degenerative disc disease), cervical 03/13/2020   Ingrowing left great toenail 03/13/2020   Recurrent major depressive disorder, in partial remission ([place]) 05/24/2020   Generalized anxiety disorder 05/24/2020   Macromastia 06/29/2020   Xiphoid pain 08/13/2020   Sternum pain 08/13/2020   Class 1 obesity due to excess calories  without serious comorbidity with body mass index (BMI) of 32.0 to 32.9 in adult 08/13/2020   Symptomatic mammary hypertrophy 09/04/2020   Costochondritis 10/22/2020   Rib pain on right side 10/22/2020   Tachycardia 10/22/2020   Bilateral leg edema 11/19/2020   Sinus drainage 11/20/2020   Seasonal allergies 12/25/2020   Type 2 diabetes mellitus with hyperglycemia, without long-term current use of insulin ([place]) 03/13/2021   Hyperlipidemia LDL goal <70 03/18/2021   Attention deficit hyperactivity disorder (ADHD), 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   Overweight (BMI 25.0-29.9) 06/18/2021   SOB (shortness of breath) on exertion 08/20/2021   Ear popping, bilateral 08/20/2021   ETD (Eustachian tube dysfunction), bilateral 08/23/2021   Resolved Ambulatory Problems    Diagnosis Date Noted   FEVER, RECURRENT 04/02/2009   Hypothyroidism 02/10/2008   SINUSITIS- ACUTE-NOS 03/24/2008   URI 01/17/2009   ANKLE PAIN, BILATERAL 07/28/2008   OPEN WOUND FT NO TOE ALONE WITHOUT MENTION COMP 03/28/2009   Bipolar 1 disorder, depressed, severe ([place]) 01/25/2018   Chronic right-sided low back pain without sciatica 01/11/2020   Hepatomegaly 10/22/2020   Chest congestion 11/20/2020   Past Medical History:  Diagnosis Date   Arthritis    Back pain, chronic    Bipolar 1 disorder ([place])    Diabetes mellitus without complication ([place])    IUD 2012    Review of Systems  All other systems reviewed and are negative. See HPI.     Objective:   Physical Exam Vitals reviewed.  Constitutional:      Appearance: Normal appearance.  HENT:  Head: Normocephalic.  Cardiovascular:     Rate and Rhythm: Normal rate and regular rhythm.     Pulses: Normal pulses.     Heart sounds: Normal heart sounds.  Pulmonary:     Effort: Pulmonary effort is normal.     Breath sounds: Normal breath sounds.  Musculoskeletal:     Right lower leg: No edema.     Left lower leg: No edema.   Neurological:     General: No focal deficit present.     Mental Status: She is alert and oriented to person, place, and time.  Psychiatric:        Mood and Affect: Mood normal.      .. Results for orders placed or performed in visit on 12/17/21  POCT glycosylated hemoglobin (Hb A1C)  Result Value Ref Range   Hemoglobin A1C 5.1 4.0 - 5.6 %   HbA1c POC (<> result, manual entry)     HbA1c, POC (prediabetic range)     HbA1c, POC (controlled diabetic range)         Assessment & Plan:  Marland KitchenMarland KitchenJayleen was seen today for follow-up.  Diagnoses and all orders for this visit:  Type 2 diabetes mellitus without complication, without long-term current use of insulin (HCC) -     POCT glycosylated hemoglobin (Hb A1C)  Visit for screening mammogram -     MM 3D SCREEN BREAST BILATERAL   A1C to goal.  Continue on ozempic but back off to .25 to make sure she is not losing too much weight.  BP to goal.  On statin.  Will get record of eye exam Covid vaccine x4.  Flu and pneumonia UTD.  Follow up in3 months.   Needs mammogram.

## 2021-12-18 ENCOUNTER — Encounter: Payer: Self-pay | Admitting: Physician Assistant

## 2021-12-19 ENCOUNTER — Other Ambulatory Visit: Payer: Self-pay | Admitting: Sports Medicine

## 2021-12-19 DIAGNOSIS — M5030 Other cervical disc degeneration, unspecified cervical region: Secondary | ICD-10-CM

## 2021-12-20 ENCOUNTER — Ambulatory Visit (INDEPENDENT_AMBULATORY_CARE_PROVIDER_SITE_OTHER): Payer: No Payment, Other | Admitting: Clinical

## 2021-12-20 DIAGNOSIS — F319 Bipolar disorder, unspecified: Secondary | ICD-10-CM | POA: Diagnosis not present

## 2021-12-20 MED FILL — CYMBALTA 30 MG CAPSULE,DELAYED RELEASE: ORAL | 30 days supply | Qty: 90 | Fill #4

## 2021-12-20 MED FILL — HYDROXYCHLOROQUINE 200 MG TABLET: ORAL | 30 days supply | Qty: 60 | Fill #1

## 2021-12-22 NOTE — Progress Notes (Signed)
° °  THERAPIST PROGRESS NOTE Virtual Visit via Video Note  I connected with Frances [name] on 12/20/21 at 10:00 AM EST by a video enabled telemedicine application and verified that I am speaking with the correct person using two identifiers.  Location: Patient: home Provider: office   I discussed the limitations of evaluation and management by telemedicine and the availability of in person appointments. The patient expressed understanding and agreed to proceed.   Follow Up Instructions: I discussed the assessment and treatment plan with the patient. The patient was provided an opportunity to ask questions and all were answered. The patient agreed with the plan and demonstrated an understanding of the instructions.   The patient was advised to call back or seek an in-person evaluation if the symptoms worsen or if the condition fails to improve as anticipated.   Session Time: 35 minutes  Participation Level: Active  Behavioral Response: CasualAlertDepressed  Type of Therapy: Individual Therapy  Treatment Goals addressed: Coping  Interventions: CBT and Supportive  Summary:  Frances [name] is a 46 y.o. female who presents for the scheduled session oriented times five, appropriately dressed, and friendly. Client denied hallucinations and delusions. Client reported on today she has been maintaining fairly well since last seen. Client reported she is not sure if it is because of her medication adjustment with the psychiatrist but she has been feeling "more at ease" lately. Client reported she has positively noticed not napping as much. Client reported she is getting approximately 8 to 9 hours of sleep per night. Client reported she is still engaged with the [employer] but her peer support specialist is retiring who also did one of the regular groups. Client reported she liked engaging in group. Client reported it was nice talking to other people who also experience depression  as she does. Client reported having an appetite is something that she struggles with due to her medication Ozempic prescribed by her PCP. Client reported supplementing with shakes if she does not eat. Client reported otherwise she wants to work on regulating her appetite, staying engaged with her hobby of riding horses, and not being hard on herself.    Suicidal/Homicidal: Nowithout intent/plan  Therapist Response:  Therapist began the appointment asking the client how she has been doing since last seen. Therapist used CBT to utilize active listening and positive emotional support towards her thoughts and feelings. Therapist used CBT to engage with the client to have her identify the source of changes made that have improved her mood. Therapist used CBT to engage with the client to have her brainstorm lessons and skills learned to help her continue to progress with her depression symptoms in daily life. Therapist used CBT to engage with the client to have her identify her continued goals for next year. Therapist assigned the client homework to continue brainstorming identified skills to help her progress in her daily functioning. Client was scheduled for next appointment.    Plan: Return again in 5 weeks.  Diagnosis: Bipolar 1 disorder   Birdena Jubilee Hammond Obeirne, LCSW 12/20/21

## 2021-12-24 NOTE — Addendum Note (Signed)
Addended by: Donella Stade on: 12/24/2021 04:48 PM   Modules accepted: Level of Service

## 2021-12-25 ENCOUNTER — Encounter: Payer: Self-pay | Admitting: Specialist

## 2021-12-25 ENCOUNTER — Ambulatory Visit (INDEPENDENT_AMBULATORY_CARE_PROVIDER_SITE_OTHER): Payer: Self-pay | Admitting: Specialist

## 2021-12-25 ENCOUNTER — Other Ambulatory Visit: Payer: Self-pay

## 2021-12-25 VITALS — BP 100/71 | HR 99 | Ht 67.0 in | Wt 156.0 lb

## 2021-12-25 DIAGNOSIS — M5137 Other intervertebral disc degeneration, lumbosacral region: Secondary | ICD-10-CM

## 2021-12-25 DIAGNOSIS — M48062 Spinal stenosis, lumbar region with neurogenic claudication: Secondary | ICD-10-CM

## 2021-12-25 DIAGNOSIS — M5136 Other intervertebral disc degeneration, lumbar region: Secondary | ICD-10-CM

## 2021-12-25 DIAGNOSIS — M0609 Rheumatoid arthritis without rheumatoid factor, multiple sites: Secondary | ICD-10-CM

## 2021-12-25 DIAGNOSIS — M4726 Other spondylosis with radiculopathy, lumbar region: Secondary | ICD-10-CM

## 2021-12-25 NOTE — Progress Notes (Signed)
Office Visit Note   Patient: Frances [name]           Date of Birth: 22-Mar-1976           MRN: [id number] Visit Date: 12/25/2021              Requested by: Donella Stade, PA-C [address],  [place] [id number] PCP: Donella Stade, PA-C   Assessment & Plan: Visit Diagnoses:  1. Rheumatoid arthritis 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 ([place])   2. DEGENERATIVE DISC DISEASE, LUMBAR SPINE   3. DDD (degenerative disc disease), lumbar   4. Other spondylosis with radiculopathy, lumbar region   5. Spinal stenosis of lumbar region with neurogenic claudication     Plan:Avoid bending, stooping and avoid lifting weights greater than 10 lbs. Avoid prolong standing and walking. Avoid frequent bending and stooping  No lifting greater than 10 lbs. May use ice or moist heat for pain. Weight loss is of benefit. Handicap license is approved. Diclofenac for arthritis pain and inflamation.    Follow-Up Instructions: No follow-ups on file.   Orders:  No orders of the defined types were placed in this encounter.  No orders of the defined types were placed in this encounter.     Procedures: No procedures performed   Clinical Data: Findings:  Narrative & Impression CLINICAL DATA:  Lumbar degenerative disc disease. Spinal stenosis of lumbar region with neurogenic claudication. Herniation of lumbar intervertebral disc. Low back pain and lateral left thigh pain.   EXAM: LUMBAR MYELOGRAM   FLUOROSCOPY TIME:  Fluoroscopy Time: 28 seconds   Radiation Exposure Index: 228.41 microGray*m^2   PROCEDURE: After thorough discussion of risks and benefits of the procedure including bleeding, infection, injury to nerves, blood vessels, adjacent structures as well as headache and CSF leak, written and oral informed consent was obtained. Consent was obtained by Dr. Logan Bores. Time out form was completed.   Patient was positioned prone on the  fluoroscopy table. Local anesthesia was provided with 1% lidocaine without epinephrine after prepped and draped in the usual sterile fashion. Puncture was performed at L5-S1 using a 3 1/2 inch 22-gauge spinal needle via a right interlaminar approach. Using a single pass through the dura, the needle was placed within the thecal sac, with return of clear CSF. 15 mL of Isovue M-200 was injected into the thecal sac, with normal opacification of the nerve roots and cauda equina consistent with free flow within the subarachnoid space.   I personally performed the lumbar puncture and administered the intrathecal contrast. I also personally supervised acquisition of the myelogram images.   TECHNIQUE: Contiguous axial images were obtained through the Lumbar spine after the intrathecal infusion of contrast. Coronal and sagittal reconstructions were obtained of the axial image sets.   COMPARISON:  Lumbar spine MRI 05/25/2021   FINDINGS: LUMBAR MYELOGRAM FINDINGS:   There are 5 non rib-bearing lumbar type vertebrae. Grade 1 retrolisthesis of L4 on L5 partially reduces with flexion. Grade 1 anterolisthesis of L3 on L4 develops with flexion. Ventral extradural defects result in mild spinal stenosis at L2-3 and L4-5 and mild-to-moderate stenosis at L3-4 without significant change between prone and upright positioning. Lateral recess stenosis is most notable at L4-5.   CT LUMBAR MYELOGRAM FINDINGS:   There is unchanged straightening of the normal lumbar lordosis with 2 mm anterolisthesis of L3 on L4 and 3 mm retrolisthesis of L4 on L5. No fracture or suspicious osseous lesion is  identified. There is mild disc space narrowing from L2-3 to L5-S1 with degenerative endplate spurring. The conus medullaris terminates at L1. A horseshoe kidney is noted.   T12-L1: Negative.   L1-2: Mild facet hypertrophy without disc herniation or stenosis.   L2-3: Disc bulging and mild facet and ligamentum  flavum hypertrophy result in mild spinal stenosis without neural foraminal stenosis, unchanged.   L3-4: Anterolisthesis with bulging uncovered disc and severe facet and mild ligamentum flavum hypertrophy result in mild-to-moderate spinal stenosis and mild bilateral lateral recess stenosis without neural foraminal stenosis, unchanged.   L4-5: Retrolisthesis with bulging uncovered disc and severe facet and mild ligamentum flavum hypertrophy result in borderline spinal stenosis and moderate bilateral lateral recess stenosis without neural foraminal stenosis, unchanged. Potential bilateral L5 nerve root impingement.   L5-S1: Disc bulging and moderate facet hypertrophy result in mild left greater than right neural foraminal stenosis, unchanged. A small central disc protrusion also appears unchanged without significant spinal or lateral recess stenosis.   IMPRESSION: 1. Unchanged lumbar disc and facet degeneration. 2. Severe L4-5 facet arthrosis with grade 1 retrolisthesis and moderate lateral recess stenosis. 3. Mild-to-moderate spinal stenosis and mild lateral recess stenosis at L3-4. 4. Mild spinal stenosis at L2-3.     Electronically Signed   By: Logan Bores M.D.   On: 09/19/2021 13:21      Subjective: Chief Complaint  Patient presents with   Lower Back - Follow-up    46 year old female with history of low back pain, worse when she is active, sitting for more than 15 minutes, pain is not too bad, the day of injection, The right side was painful, she reports that the left side is usually painful. She reports that soft chairs or seats are preferred. No bowel or bladder difficulties. Numbness left anterior and latera thigh to the knee. Under went facet block by Dr. Ernestina Patches 12/21 and she reports that she is not sure that it helped as she  Remained sedentary after the shot.    Review of Systems  Constitutional: Negative.   HENT: Negative.    Eyes: Negative.   Respiratory:  Negative.    Cardiovascular: Negative.   Gastrointestinal: Negative.   Endocrine: Negative.   Genitourinary: Negative.   Musculoskeletal: Negative.   Skin: Negative.   Allergic/Immunologic: Negative.   Neurological: Negative.   Hematological: Negative.   Psychiatric/Behavioral: Negative.      Objective: Vital Signs: BP 100/71 (BP Location: Left Arm, Patient Position: Sitting)    Pulse 99    Ht 5\' 7"  (1.702 m)    Wt 156 lb (70.8 kg)    BMI 24.43 kg/m   Physical Exam Constitutional:      Appearance: She is well-developed.  HENT:     Head: Normocephalic and atraumatic.  Eyes:     Pupils: Pupils are equal, round, and reactive to light.  Pulmonary:     Effort: Pulmonary effort is normal.     Breath sounds: Normal breath sounds.  Abdominal:     General: Bowel sounds are normal.     Palpations: Abdomen is soft.  Musculoskeletal:     Cervical back: Normal range of motion and neck supple.     Lumbar back: Negative right straight leg raise test and negative left straight leg raise test.  Skin:    General: Skin is warm and dry.  Neurological:     Mental Status: She is alert and oriented to person, place, and time.  Psychiatric:  Behavior: Behavior normal.        Thought Content: Thought content normal.        Judgment: Judgment normal.    Back Exam   Tenderness  The patient is experiencing tenderness in the lumbar.  Range of Motion  Extension:  abnormal  Flexion:  abnormal  Lateral bend right:  abnormal  Lateral bend left:  abnormal  Rotation right:  abnormal  Rotation left:  abnormal   Muscle Strength  Right Quadriceps:  5/5  Left Quadriceps:  5/5  Right Hamstrings:  5/5  Left Hamstrings:  5/5   Tests  Straight leg raise right: negative Straight leg raise left: negative  Reflexes  Patellar:  2/4 Achilles:  2/4  Other  Toe walk: normal Heel walk: normal Sensation: normal Gait: normal  Erythema: no back redness Scars: absent     Specialty  Comments:  No specialty comments available.  Imaging: No results found.   PMFS History: Patient Active Problem List   Diagnosis Date Noted   ETD (Eustachian tube dysfunction), bilateral 08/23/2021   SOB (shortness of breath) on exertion 08/20/2021   Ear popping, bilateral 08/20/2021   Overweight (BMI 25.0-29.9) 06/18/2021   Attention deficit hyperactivity disorder (ADHD), 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   Hyperlipidemia LDL goal <70 03/18/2021   Type 2 diabetes mellitus with hyperglycemia, without long-term current use of insulin ([place]) 03/13/2021   Seasonal allergies 12/25/2020   Sinus drainage 11/20/2020   Bilateral leg edema 11/19/2020   Costochondritis 10/22/2020   Rib pain on right side 10/22/2020   Tachycardia 10/22/2020   Symptomatic mammary hypertrophy 09/04/2020   Xiphoid pain 08/13/2020   Sternum pain 08/13/2020   Class 1 obesity due to excess calories without serious comorbidity with body mass index (BMI) of 32.0 to 32.9 in adult 08/13/2020   Macromastia 06/29/2020   Recurrent major depressive disorder, in partial remission ([place]) 05/24/2020   Generalized anxiety disorder 05/24/2020   DDD (degenerative disc disease), cervical 03/13/2020   Ingrowing left great toenail 03/13/2020   Labral tear of hip, degenerative, right 01/11/2020   Grief reaction 11/28/2019   Toenail fungus 09/09/2019   Vasovagal response 09/09/2019   Decreased visual acuity 08/30/2018   Right corneal abrasion 08/30/2018   Elevated fasting glucose 04/08/2018   Depression    Anxiety 01/22/2018   Borderline personality disorder ([place]) 01/22/2018   Rheumatoid arthritis 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 ([place]) 12/09/2017   TMJ (temporomandibular joint syndrome) 09/26/2017   New daily persistent headache 09/26/2017   MTHFR gene mutation 09/20/2017   Bipolar I disorder ([place]) 09/16/2017   Fibromyalgia 09/16/2017   Chronic fatigue syndrome 09/16/2017   DYSPNEA 10/15/2011    Back pain 10/13/2008   DERMATITIS, ALLERGIC 06/05/2008   FATIGUE 05/05/2008   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   DDD (degenerative disc disease), lumbar 02/10/2008   FIBROMYALGIA 02/10/2008   IRON, SERUM, ELEVATED 02/10/2008   Past Medical History:  Diagnosis Date   Anxiety    Arthritis    Back pain, chronic    Bipolar 1 disorder (HCC)    Chronic fatigue syndrome    DDD (degenerative disc disease), lumbar    Depression    Diabetes mellitus without complication ([place])    Fibromyalgia    IUD 2012   Mirena   MTHFR gene mutation     Family History  Problem Relation Age of Onset   Diabetes Mother    Stroke Mother    Lupus Mother    Hypertension Mother    Congestive  Heart Failure Mother    Mental illness Brother        Not clear what his diagnosis is--may be related to previous drug use.   Cancer Maternal Grandmother        colon   Depression Maternal Uncle    Alcohol abuse Maternal Uncle    Diabetes Maternal Grandfather     Past Surgical History:  Procedure Laterality Date   COLONOSCOPY WITH PROPOFOL N/A 02/25/2017   Procedure: COLONOSCOPY WITH PROPOFOL;  Surgeon: Arta Silence, MD;  Location: [facility];  Service: Endoscopy;  Laterality: N/A;   left knee lateral release  1993   scar tisue removal  03/2005   left ankle   TONSILLECTOMY  age 69's   Social History   Occupational History   Occupation: Designer, industrial/product at times  Tobacco Use   Smoking status: Never   Smokeless tobacco: Never  Vaping Use   Vaping Use: Never used  Substance and Sexual Activity   Alcohol use: No    Alcohol/week: 0.0 standard drinks   Drug use: No   Sexual activity: Yes    Birth control/protection: I.U.D.

## 2021-12-25 NOTE — Patient Instructions (Addendum)
Plan:Avoid bending, stooping and avoid lifting weights greater than 10 lbs. Avoid prolong standing and walking. Avoid frequent bending and stooping  No lifting greater than 10 lbs. May use ice or moist heat for pain. Weight loss is of benefit. Handicap license is approved. Diclofenac for arthritis pain and inflamation.

## 2021-12-26 ENCOUNTER — Ambulatory Visit (INDEPENDENT_AMBULATORY_CARE_PROVIDER_SITE_OTHER): Payer: Self-pay

## 2021-12-26 DIAGNOSIS — Z1231 Encounter for screening mammogram for malignant neoplasm of breast: Secondary | ICD-10-CM

## 2021-12-27 NOTE — Progress Notes (Signed)
Normal mammogram. Follow up in 1 year.

## 2021-12-30 ENCOUNTER — Ambulatory Visit (INDEPENDENT_AMBULATORY_CARE_PROVIDER_SITE_OTHER): Payer: Self-pay | Admitting: Physical Medicine and Rehabilitation

## 2021-12-30 ENCOUNTER — Other Ambulatory Visit: Payer: Self-pay

## 2021-12-30 ENCOUNTER — Encounter: Payer: Self-pay | Admitting: Physical Medicine and Rehabilitation

## 2021-12-30 VITALS — BP 104/69 | HR 104

## 2021-12-30 DIAGNOSIS — G8929 Other chronic pain: Secondary | ICD-10-CM

## 2021-12-30 DIAGNOSIS — M47816 Spondylosis without myelopathy or radiculopathy, lumbar region: Secondary | ICD-10-CM

## 2021-12-30 DIAGNOSIS — M545 Low back pain, unspecified: Secondary | ICD-10-CM

## 2021-12-30 DIAGNOSIS — M4310 Spondylolisthesis, site unspecified: Secondary | ICD-10-CM

## 2021-12-30 NOTE — Progress Notes (Signed)
Pt state lower back pain that travels to her left hip and leg. Pt state walking, sitting and standing makes the pain worse. Pt state she takes pain meds and lay in bed to help ease her pain.  Numeric Pain Rating Scale and Functional Assessment Average Pain 8 Pain Right Now 4 My pain is intermittent, sharp, stabbing, and aching Pain is worse with: walking, sitting, standing, and some activites Pain improves with: rest and medication   In the last MONTH (on 0-10 scale) has pain interfered with the following?  1. General activity like being  able to carry out your everyday physical activities such as walking, climbing stairs, carrying groceries, or moving a chair?  Rating(7)  2. Relation with others like being able to carry out your usual social activities and roles such as  activities at home, at work and in your community. Rating(8)  3. Enjoyment of life such that you have  been bothered by emotional problems such as feeling anxious, depressed or irritable?  Rating(9)
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
## 2021-12-31 ENCOUNTER — Ambulatory Visit (INDEPENDENT_AMBULATORY_CARE_PROVIDER_SITE_OTHER): Payer: Self-pay | Admitting: Physician Assistant

## 2021-12-31 ENCOUNTER — Encounter: Payer: Self-pay | Admitting: Physician Assistant

## 2021-12-31 VITALS — BP 105/71 | HR 100

## 2021-12-31 DIAGNOSIS — M7711 Lateral epicondylitis, right elbow: Secondary | ICD-10-CM | POA: Insufficient documentation

## 2021-12-31 DIAGNOSIS — L600 Ingrowing nail: Secondary | ICD-10-CM

## 2021-12-31 NOTE — Progress Notes (Signed)
Subjective:    Patient ID: Frances [name], female    DOB: 12/16/1976, 46 y.o.   MRN: [id number]  HPI Pt is a 46 yo female who presents to the clinic with 2 months of right lateral ingrown toenail and pain and 1 month of lateral right elbow pain.   No injuries. Hx of ingrown toenails. Not tried anything to make better. Has NSAIDs and takes them daily.   Right elbow no bruising, redness, swelling.   Right great toe tenderness to touch but no redness, swelling, drainage.    .. 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   DERMATITIS, ALLERGIC 06/05/2008   DDD (degenerative disc disease), lumbar 02/10/2008   Back pain 10/13/2008   FIBROMYALGIA 02/10/2008   FATIGUE 05/05/2008   IRON, SERUM, ELEVATED 02/10/2008   DYSPNEA 10/15/2011   Bipolar I disorder ([place]) 09/16/2017   Fibromyalgia 09/16/2017   Chronic fatigue syndrome 09/16/2017   MTHFR gene mutation 09/20/2017   TMJ (temporomandibular joint syndrome) 09/26/2017   New daily persistent headache 09/26/2017   Rheumatoid arthritis 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 ([place]) 12/09/2017   Anxiety 01/22/2018   Borderline personality disorder ([place]) 01/22/2018   Depression    Elevated fasting glucose 04/08/2018   Decreased visual acuity 08/30/2018   Right corneal abrasion 08/30/2018   Toenail fungus 09/09/2019   Vasovagal response 09/09/2019   Grief reaction 11/28/2019   Labral tear of hip, degenerative, right 01/11/2020   DDD (degenerative disc disease), cervical 03/13/2020   Ingrowing left great toenail 03/13/2020   Recurrent major depressive disorder, in partial remission ([place]) 05/24/2020   Generalized anxiety disorder 05/24/2020   Macromastia 06/29/2020   Xiphoid pain 08/13/2020   Sternum pain 08/13/2020   Class 1 obesity due to excess calories without serious comorbidity with body mass index (BMI) of 32.0 to 32.9 in adult 08/13/2020   Symptomatic mammary  hypertrophy 09/04/2020   Costochondritis 10/22/2020   Rib pain on right side 10/22/2020   Tachycardia 10/22/2020   Bilateral leg edema 11/19/2020   Sinus drainage 11/20/2020   Seasonal allergies 12/25/2020   Type 2 diabetes mellitus with hyperglycemia, without long-term current use of insulin ([place]) 03/13/2021   Hyperlipidemia LDL goal <70 03/18/2021   Attention deficit hyperactivity disorder (ADHD), 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   Overweight (BMI 25.0-29.9) 06/18/2021   SOB (shortness of breath) on exertion 08/20/2021   Ear popping, bilateral 08/20/2021   ETD (Eustachian tube dysfunction), bilateral 08/23/2021   Right lateral epicondylitis 12/31/2021   Resolved Ambulatory Problems    Diagnosis Date Noted   FEVER, RECURRENT 04/02/2009   Hypothyroidism 02/10/2008   SINUSITIS- ACUTE-NOS 03/24/2008   URI 01/17/2009   ANKLE PAIN, BILATERAL 07/28/2008   OPEN WOUND FT NO TOE ALONE WITHOUT MENTION COMP 03/28/2009   Bipolar 1 disorder, depressed, severe ([place]) 01/25/2018   Chronic right-sided low back pain without sciatica 01/11/2020   Hepatomegaly 10/22/2020   Chest congestion 11/20/2020   Past Medical History:  Diagnosis Date   Arthritis    Back pain, chronic    Bipolar 1 disorder ([place])    Diabetes mellitus without complication ([place])    IUD 2012     Review of Systems See HPI.     Objective:   Physical Exam  Tenderness over right lateral epicondyle. No redness, warmth, swelling.  Tenderness over right lateral nail edge with jagged toenail and erythema present. No discharge.   Toenail Avulsion Procedure Note  Pre-operative Diagnosis: Right Ingrown  Great toenail   Post-operative Diagnosis: Right Ingrown Great toenail  Indications: pain  Anesthesia: Lidocaine 1% without epinephrine without added sodium bicarbonate  Procedure Details  History of allergy to iodine: no  The risks (including bleeding and infection) and benefits of the  procedure and Verbal  informed consent obtained.  After digital block anesthesia was obtained, a tourniquet was applied for hemostasis during the procedure.  After prepping with Betadine, the offending edge of the nail was freed from the nailbed and perionychium, and then split with scissors and removed with  forceps.  All visible granulation tissue is debrided. Antibiotic and bulky dressing was applied.   Findings: Ingrown toenail right lateral nail edge  Complications: none.  Plan: 1. Soak the foot twice daily. Change dressing twice daily until healed over. 2. Warning signs of infection were reviewed.   3. Recommended that the patient use OTC acetaminophen as needed for pain.      Assessment & Plan:  Marland KitchenMarland KitchenVale was seen today for right elbow pain, ingrown toenail.  Diagnoses and all orders for this visit:  Right lateral epicondylitis  Ingrown toenail of right foot   Placed in tennis elbow strap today Exercises given Ice, rest, NSAIDs follow up in 2 weeks.   Ingrown toenail removed today. HO given on care at home. Discussed 24 hours in bandage then can soak foot.  Pt is on pain contract. Changed pharmacy today. Can use pain rx as needed.

## 2021-12-31 NOTE — Patient Instructions (Addendum)
Ingrown Toenail An ingrown toenail occurs when the corner or sides of a toenail grow into the surrounding skin. This causes discomfort and pain. The big toe is most commonly affected, but any of the toes can be affected. If an ingrown toenail is not treated, it can become infected. What are the causes? This condition may be caused by: Wearing shoes that are too small or tight. An injury, such as stubbing your toe or having your toe stepped on. Improper cutting or care of your toenails. Having nail or foot abnormalities that were present from birth (congenital abnormalities), such as having a nail that is too big for your toe. What increases the risk? The following factors may make you more likely to develop ingrown toenails: Age. Nails tend to get thicker with age, so ingrown nails are more common among older people. Cutting your toenails incorrectly, such as cutting them very short or cutting them unevenly. An ingrown toenail is more likely to get infected if you have: Diabetes. Blood flow (circulation) problems. What are the signs or symptoms? Symptoms of an ingrown toenail may include: Pain, soreness, or tenderness. Redness. Swelling. Hardening of the skin that surrounds the toenail. Signs that an ingrown toenail may be infected include: Fluid or pus. Symptoms that get worse. How is this diagnosed? Ingrown toenails may be diagnosed based on: Your symptoms and medical history. A physical exam. Labs or tests. If you have fluid or blood coming from your toenail, a sample may be collected to test for the specific type of bacteria that is causing the infection. How is this treated? Treatment depends on the severity of your symptoms. You may be able to care for your toenail at home. If you have an infection, you may be prescribed antibiotic medicines. If you have fluid or pus draining from your toenail, your health care provider may drain it. If you have trouble walking, you may be  given crutches to use. If you have a severe or infected ingrown toenail, you may need a procedure to remove part or all of the nail. Follow these instructions at home: [place] your wound every day for signs of infection, or as often as told by your health care provider. Check for: More redness, swelling, or pain. More fluid or blood. Warmth. Pus or a bad smell. Do not pick at your toenail or try to remove it yourself. Soak your foot in warm, soapy water. Do this for 20 minutes, 3 times a day, or as often as told by your health care provider. This helps to keep your toe clean and your skin soft. Wear shoes that fit well and are not too tight. Your health care provider may recommend that you wear open-toed shoes while you heal. Trim your toenails regularly and carefully. Cut your toenails straight across to prevent injury to the skin at the corners of the toenail. Do not cut your nails in a curved shape. Keep your feet clean and dry to help prevent infection. General instructions Take over-the-counter and prescription medicines only as told by your health care provider. If you were prescribed an antibiotic, take it as told by your health care provider. Do not stop taking the antibiotic even if you start to feel better. If your health care provider told you to use crutches to help you move around, use them as instructed. Return to your normal activities as told by your health care provider. Ask your health care provider what activities are safe for you. Keep  all follow-up visits. This is important. Contact a health care provider if: You have more redness, swelling, pain, or other symptoms that do not improve with treatment. You have fluid, blood, or pus coming from your toenail. You have a red streak on your skin that starts at your foot and spreads up your leg. You have a fever. Summary An ingrown toenail occurs when the corner or sides of a toenail grow into the surrounding skin.  This causes discomfort and pain. The big toe is most commonly affected, but any of the toes can be affected. If an ingrown toenail is not treated, it can become infected. Fluid or pus draining from your toenail is a sign of infection. Your health care provider may need to drain it. You may be given antibiotics to treat the infection. Trimming your toenails regularly and properly can help you prevent an ingrown toenail. This information is not intended to replace advice given to you by your health care provider. Make sure you discuss any questions you have with your health care provider. Document Revised: 04/09/2021 Document Reviewed: 04/09/2021 Elsevier Patient Education  [place] Ask your health care provider which exercises are safe for you. Do exercises exactly as told by your health care provider and adjust them as directed. It is normal to feel mild stretching, pulling, tightness, or discomfort as you do these exercises. Stop right away if you feel sudden pain or your pain gets worse. Do not begin these exercises until told by your health care provider. Stretching and range-of-motion exercises These exercises warm up your muscles and joints and improve the movement and flexibility of your elbow. Wrist flexion, assisted  Straighten your left / right elbow in front of you with your palm facing down toward the floor. If told by your health care provider, bend your left / right elbow to a 90-degree angle (right angle) at your side instead of holding it straight. With your other hand, gently push over the back of your left / right hand so your fingers point toward the floor (flexion). Stop when you feel a gentle stretch on the back of your forearm. Hold this position for __________ seconds. Repeat __________ times. Complete this exercise __________ times a day. Wrist extension, assisted  Straighten your left / right elbow in front of you with your palm facing up  toward the ceiling. If told by your health care provider, bend your left / right elbow to a 90-degree angle (right angle) at your side instead of holding it straight. With your other hand, gently pull your left / right hand and fingers toward the floor (extension). Stop when you feel a gentle stretch on the palm side of your forearm. Hold this position for __________ seconds. Repeat __________ times. Complete this exercise __________ times a day. Assisted forearm rotation, supination Sit or stand with your elbows at your side. Bend your left / right elbow to a 90-degree angle (right angle). Using your uninjured hand, turn your left / right palm up toward the ceiling (supination) until you feel a gentle stretch along the inside of your forearm. Hold this position for __________ seconds. Repeat __________ times. Complete this exercise __________ times a day. Assisted forearm rotation, pronation Sit or stand with your elbows at your side. Bend your left / right elbow to a 90-degree angle (right angle). Using your uninjured hand, turn your left / right palm down toward the floor (pronation) until you feel a gentle stretch along  the outside of your forearm. Hold this position for __________ seconds. Repeat __________ times. Complete this exercise __________ times a day. Strengthening exercises These exercises build strength and endurance in your forearm and elbow. Endurance is the ability to use your muscles for a long time, even after they get tired. Radial deviation  Stand with a __________ weight or a hammer in your left / right hand. Or, sit while holding a rubber exercise band or tubing, with your left / right forearm supported on a table or countertop. Position your forearm so that the thumb is facing the ceiling, as if you are going to clap your hands. This is the neutral position. Raise your hand upward in front of you so your thumb moves toward the ceiling (radial deviation), or pull up on  the rubber tubing. Keep your forearm and elbow still while you move your wrist only. Hold this position for __________ seconds. Slowly return to the starting position. Repeat __________ times. Complete this exercise __________ times a day. Wrist extension, eccentric Sit with your left / right forearm palm-down and supported on a table or other surface. Let your left / right wrist extend over the edge of the surface. Hold a __________ weight or a piece of exercise band or tubing in your left / right hand. If using a rubber exercise band or tubing, hold the other end of the tubing with your other hand. Use your uninjured hand to move your left / right hand up toward the ceiling. Take your uninjured hand away and slowly return to the starting position using only your left / right hand. Lowering your arm under tension is called eccentric extension. Repeat __________ times. Complete this exercise __________ times a day. Wrist extension Do not do this exercise if it causes pain at the outside of your elbow. Only do this exercise once instructed by your health care provider. Sit with your left / right forearm supported on a table or other surface and your palm turned down toward the floor. Let your left / right wrist extend over the edge of the surface. Hold a __________ weight or a piece of rubber exercise band or tubing. If you are using a rubber exercise band or tubing, hold the band or tubing in place with your other hand to provide resistance. Slowly bend your wrist so your hand moves up toward the ceiling (extension). Move only your wrist, keeping your forearm and elbow still. Hold this position for __________ seconds. Slowly return to the starting position. Repeat __________ times. Complete this exercise __________ times a day. Forearm rotation, supination To do this exercise, you will need a lightweight hammer or rubber mallet. Sit with your left / right forearm supported on a table or other  surface. Bend your elbow to a 90-degree angle (right angle). Position your forearm so that your palm is facing down toward the floor, with your hand resting over the edge of the table. Hold a hammer in your left / right hand. To make this exercise easier, hold the hammer near the head of the hammer. To make this exercise harder, hold the hammer near the end of the handle. Without moving your wrist or elbow, slowly rotate your forearm so your palm faces up toward the ceiling (supination). Hold this position for __________ seconds. Slowly return to the starting position. Repeat __________ times. Complete this exercise __________ times a day. Shoulder blade squeeze Sit in a stable chair or stand with good posture. If you are sitting down,  do not let your back touch the back of the chair. Your arms should be at your sides with your elbows bent to a 90-degree angle (right angle). Position your forearms so that your thumbs are facing the ceiling (neutral position). Without lifting your shoulders up, squeeze your shoulder blades tightly together. Hold this position for __________ seconds. Slowly release and return to the starting position. Repeat __________ times. Complete this exercise __________ times a day. This information is not intended to replace advice given to you by your health care provider. Make sure you discuss any questions you have with your health care provider. Document Revised: 02/29/2020 Document Reviewed: 02/29/2020 Elsevier Patient Education  [place].

## 2022-01-01 ENCOUNTER — Encounter: Payer: Self-pay | Admitting: Physician Assistant

## 2022-01-02 DIAGNOSIS — M1990 Unspecified osteoarthritis, unspecified site: Principal | ICD-10-CM

## 2022-01-02 MED ORDER — FAMOTIDINE 40 MG TABLET
ORAL_TABLET | Freq: Every evening | ORAL | 5 refills | 60 days | Status: CP
Start: 2022-01-02 — End: 2022-03-13
  Filled 2022-01-06: qty 60, 60d supply, fill #0

## 2022-01-06 ENCOUNTER — Ambulatory Visit (INDEPENDENT_AMBULATORY_CARE_PROVIDER_SITE_OTHER): Payer: Self-pay

## 2022-01-06 ENCOUNTER — Ambulatory Visit (INDEPENDENT_AMBULATORY_CARE_PROVIDER_SITE_OTHER): Payer: Self-pay | Admitting: Physician Assistant

## 2022-01-06 ENCOUNTER — Other Ambulatory Visit: Payer: Self-pay

## 2022-01-06 VITALS — BP 121/88 | HR 108 | Ht 67.0 in | Wt 153.0 lb

## 2022-01-06 DIAGNOSIS — M25551 Pain in right hip: Secondary | ICD-10-CM

## 2022-01-06 DIAGNOSIS — M25511 Pain in right shoulder: Secondary | ICD-10-CM

## 2022-01-06 DIAGNOSIS — M5136 Other intervertebral disc degeneration, lumbar region: Secondary | ICD-10-CM

## 2022-01-06 DIAGNOSIS — M0609 Rheumatoid arthritis without rheumatoid factor, multiple sites: Secondary | ICD-10-CM

## 2022-01-06 DIAGNOSIS — M7541 Impingement syndrome of right shoulder: Secondary | ICD-10-CM | POA: Insufficient documentation

## 2022-01-06 DIAGNOSIS — M545 Low back pain, unspecified: Secondary | ICD-10-CM

## 2022-01-06 DIAGNOSIS — G8929 Other chronic pain: Secondary | ICD-10-CM

## 2022-01-06 MED ORDER — HYDROCODONE-ACETAMINOPHEN 5-325 MG PO TABS: 1.0000 | ORAL_TABLET | Freq: Two times a day (BID) | ORAL | 0 refills | Status: DC | PRN

## 2022-01-06 NOTE — Patient Instructions (Signed)
Distal Biceps Tendinitis Rehab Ask your health care provider which exercises are safe for you. Do exercises exactly as told by your health care provider and adjust them as directed. It is normal to feel mild stretching, pulling, tightness, or discomfort as you do these exercises. Stop right away if you feel sudden pain or your pain gets worse. Do not begin these exercises until told by your health care provider. Stretching and range-of-motion exercises These exercises warm up your muscles and joints and improve the movement and flexibility of your arm. These exercises can also help to relieve pain and stiffness. Elbow range of motion Stand or sit with your left / right elbow bent in a 90-degree angle (right angle). Position your forearm so that the thumb is facing the ceiling (neutral position). Slowly straighten your elbow until you feel a stretch. Hold this position for __________ seconds. Slowly bend your elbow until you feel a stretch, or until you touch your thumb to your shoulder. Hold this position for __________ seconds. Repeat __________ times. Complete this exercise __________ times a day. Forearm rotation, supination Stand up or sit with your left / right elbow bent in a 90-degree angle (right angle). Rotate your palm up until you cannot rotate it anymore (supination). Then, use your other hand to help turn your left / right forearm more. Hold this position for __________ seconds. Slowly return to the starting position. Repeat __________ times. Complete this exercise __________ times a day. Forearm rotation, pronation Stand up or sit with your left / right elbow bent in a 90-degree angle (right angle). Turn (rotate) your left / right palm down (pronation) until you cannot rotate it anymore. Then, use your other hand to help turn your left / right forearm more. Hold this position for __________ seconds. Slowly return to the starting position. Repeat __________ times. Complete this  exercise __________ times a day. Biceps stretch Stand by a door frame. Place your left / right hand on the door frame. Keep your elbow straight during the exercise. Gently turn your body toward the opposite side until you feel a gentle stretch. Hold this position for __________ seconds. Slowly return to the starting position. Repeat __________ times. Complete this exercise __________ times a day. Strengthening exercises These exercises build strength and endurance in your arm and shoulder. Endurance is the ability to use your muscles for a long time, even after they get tired. Forearm rotation, supination  Sit with your left / right forearm supported on a table. Your elbow should be at waist height. Gently grasp a lightweight hammer near the head. As this exercise gets easier for you, try holding the hammer farther down the handle. Rest your hand over the edge of the table, palm down. Without moving your left / right elbow, slowly rotate your palm up (supination), stopping when your thumb is pointed toward the ceiling. Hold this position for__________ seconds. Slowly return to the starting position. Repeat __________ times. Complete this exercise __________ times a day. Forearm rotation, pronation  Sit with your left / right forearm supported on a table. Your elbow should be at waist height. Gently grasp a lightweight hammer near the head. As this exercise gets easier for you, try holding the hammer farther down the handle. Rest your hand over the edge of the table, palm up. Without moving your left / right elbow, slowly rotate your palm down (pronation), stopping when your thumb is pointed toward the floor. Hold this position for __________ seconds. Slowly return to the  starting position. Repeat __________ times. Complete this exercise __________ times a day. Biceps curls  Sit on a stable chair without armrests, or stand up. If directed, hold a __________ lb / kg weight in your left /  right hand, or hold an exercise band with both hands. Your palms should face up toward the ceiling at the starting position. Bend your left / right elbow and move your hand up toward your shoulder. Keep your other arm straight down, in the starting position. Slowly return to the starting position. Repeat __________ times. Complete this exercise __________ times a day. Elbow extension, supine  Lie on your back (supine position). Hold a __________ lb / kg weight in your left / right hand. Bend your left / right elbow to a 90-degree angle (right angle) so the weight is in front of your face, over your chest, and your elbow is pointed up to the ceiling. Straighten your elbow, raising your hand toward the ceiling (extension). Use your other hand to support your left / right upper arm and to keep it still. Slowly return to the starting position. Repeat __________ times. Complete this exercise __________ times a day. This information is not intended to replace advice given to you by your health care provider. Make sure you discuss any questions you have with your health care provider. Document Revised: 01/31/2021 Document Reviewed: 01/31/2021 Elsevier Patient Education  2022 [employer].

## 2022-01-06 NOTE — Progress Notes (Signed)
Subjective:    Patient ID: Frances [name], female    DOB: November 13, 1976, 46 y.o.   MRN: [id number]  HPI Pt is a 46 yo female who presents to the clinic with right anterior shoulder pain. Yesterday patient went to reach behind her and felt her right should pop. She has had pain in her right shoulder before but not like this. NSAIds not helping much.    .. 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   DERMATITIS, ALLERGIC 06/05/2008   DDD (degenerative disc disease), lumbar 02/10/2008   Back pain 10/13/2008   FIBROMYALGIA 02/10/2008   FATIGUE 05/05/2008   IRON, SERUM, ELEVATED 02/10/2008   DYSPNEA 10/15/2011   Bipolar I disorder ([place]) 09/16/2017   Fibromyalgia 09/16/2017   Chronic fatigue syndrome 09/16/2017   MTHFR gene mutation 09/20/2017   TMJ (temporomandibular joint syndrome) 09/26/2017   New daily persistent headache 09/26/2017   Rheumatoid arthritis 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 ([place]) 12/09/2017   Anxiety 01/22/2018   Borderline personality disorder ([place]) 01/22/2018   Depression    Elevated fasting glucose 04/08/2018   Decreased visual acuity 08/30/2018   Right corneal abrasion 08/30/2018   Toenail fungus 09/09/2019   Vasovagal response 09/09/2019   Grief reaction 11/28/2019   Labral tear of hip, degenerative, right 01/11/2020   DDD (degenerative disc disease), cervical 03/13/2020   Ingrowing left great toenail 03/13/2020   Recurrent major depressive disorder, in partial remission ([place]) 05/24/2020   Generalized anxiety disorder 05/24/2020   Macromastia 06/29/2020   Xiphoid pain 08/13/2020   Sternum pain 08/13/2020   Class 1 obesity due to excess calories without serious comorbidity with body mass index (BMI) of 32.0 to 32.9 in adult 08/13/2020   Symptomatic mammary hypertrophy 09/04/2020   Costochondritis 10/22/2020   Rib pain on right side 10/22/2020   Tachycardia 10/22/2020   Bilateral leg  edema 11/19/2020   Sinus drainage 11/20/2020   Seasonal allergies 12/25/2020   Type 2 diabetes mellitus with hyperglycemia, without long-term current use of insulin ([place]) 03/13/2021   Hyperlipidemia LDL goal <70 03/18/2021   Attention deficit hyperactivity disorder (ADHD), 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   Overweight (BMI 25.0-29.9) 06/18/2021   SOB (shortness of breath) on exertion 08/20/2021   Ear popping, bilateral 08/20/2021   ETD (Eustachian tube dysfunction), bilateral 08/23/2021   Right lateral epicondylitis 12/31/2021   Chronic right hip pain 01/06/2022   Acute pain of right shoulder 01/06/2022   Resolved Ambulatory Problems    Diagnosis Date Noted   FEVER, RECURRENT 04/02/2009   Hypothyroidism 02/10/2008   SINUSITIS- ACUTE-NOS 03/24/2008   URI 01/17/2009   ANKLE PAIN, BILATERAL 07/28/2008   OPEN WOUND FT NO TOE ALONE WITHOUT MENTION COMP 03/28/2009   Bipolar 1 disorder, depressed, severe ([place]) 01/25/2018   Chronic right-sided low back pain without sciatica 01/11/2020   Hepatomegaly 10/22/2020   Chest congestion 11/20/2020   Past Medical History:  Diagnosis Date   Arthritis    Back pain, chronic    Bipolar 1 disorder ([place])    Diabetes mellitus without complication ([place])    IUD 2012    Review of Systems See HPI.     Objective:   Physical Exam  Right shoulder: No bruising, redness or swelling Tenderness over anterior shoulder in bicep groove Pain with resisted supination and bicep resistance Negative drop arm sign Some tenderness with hawkins No pain with overhead movements Strength 5/5.       Assessment & Plan:  Marland KitchenMarland Kitchen  Frances [name] was seen today for shoulder pain.  Diagnoses and all orders for this visit:  Acute pain of right shoulder -     DG Shoulder Right; Future  Rheumatoid arthritis 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 (HCC) -     HYDROcodone-acetaminophen (NORCO/VICODIN) 5-325 MG tablet; Take 1 tablet by mouth 2 (two) times  daily as needed for moderate pain (back pain).  Chronic right-sided low back pain without sciatica -     HYDROcodone-acetaminophen (NORCO/VICODIN) 5-325 MG tablet; Take 1 tablet by mouth 2 (two) times daily as needed for moderate pain (back pain).  Chronic right hip pain -     HYDROcodone-acetaminophen (NORCO/VICODIN) 5-325 MG tablet; Take 1 tablet by mouth 2 (two) times daily as needed for moderate pain (back pain).  DDD (degenerative disc disease), lumbar -     HYDROcodone-acetaminophen (NORCO/VICODIN) 5-325 MG tablet; Take 1 tablet by mouth 2 (two) times daily as needed for moderate pain (back pain).   From PE seems like some bicep tendonitis.  Xray ordered Continue NSAID Request refills on norco.  .Marland KitchenPDMP reviewed during this encounter. 11/29 last refill Pain contract UTD Refilled norco Exercises given Rest and ice Follow up with Dr. Darene Lamer in 2 weeks if not improving

## 2022-01-07 ENCOUNTER — Ambulatory Visit (INDEPENDENT_AMBULATORY_CARE_PROVIDER_SITE_OTHER): Payer: No Payment, Other | Admitting: Clinical

## 2022-01-07 ENCOUNTER — Other Ambulatory Visit: Payer: Self-pay | Admitting: Physician Assistant

## 2022-01-07 ENCOUNTER — Encounter: Payer: Self-pay | Admitting: Physician Assistant

## 2022-01-07 DIAGNOSIS — E785 Hyperlipidemia, unspecified: Secondary | ICD-10-CM

## 2022-01-07 DIAGNOSIS — F319 Bipolar disorder, unspecified: Secondary | ICD-10-CM | POA: Diagnosis not present

## 2022-01-07 DIAGNOSIS — J3489 Other specified disorders of nose and nasal sinuses: Secondary | ICD-10-CM

## 2022-01-07 DIAGNOSIS — J302 Other seasonal allergic rhinitis: Secondary | ICD-10-CM

## 2022-01-07 DIAGNOSIS — R0989 Other specified symptoms and signs involving the circulatory and respiratory systems: Secondary | ICD-10-CM

## 2022-01-07 NOTE — Progress Notes (Signed)
No acute changes found on xray. I do think think is more soft tissue and tendon related. If not improving with right shoulder pain see Dr. Darene Lamer in 2 weeks.

## 2022-01-08 NOTE — Progress Notes (Signed)
° °  THERAPIST PROGRESS NOTE Virtual Visit via Video Note  I connected with Para March on 01/07/2022 at 10:00 AM EST by a video enabled telemedicine application and verified that I am speaking with the correct person using two identifiers.  Location: Patient: home Provider: office   I discussed the limitations of evaluation and management by telemedicine and the availability of in person appointments. The patient expressed understanding and agreed to proceed.   Follow Up Instructions: I discussed the assessment and treatment plan with the patient. The patient was provided an opportunity to ask questions and all were answered. The patient agreed with the plan and demonstrated an understanding of the instructions.   The patient was advised to call back or seek an in-person evaluation if the symptoms worsen or if the condition fails to improve as anticipated.   Session Time: 20 minutes  Participation Level: Active  Behavioral Response: CasualAlertDepressed  Type of Therapy: Individual Therapy  Treatment Goals addressed: Coping  Interventions: CBT and Supportive  Summary:  Frances [name] is a 46 y.o. female who presents for the scheduled session oriented x5, appropriately dressed, and friendly.  Client denied hallucinations and delusions. Client reported on today that she has been doing fairly well.  Client reported she feels her depressive symptoms have been stable.  Client reported recently she took in a friend to stay with her for a couple of weeks.  Client reported he has been nice having some company and her friend is helping her to clean up her house.  Client reported she has good days feeling motivated and is able to do things around the house and others when she does not.  Client reported she has been feeling down about her elderly cat, she will have to take to the veterinarian to euthanize.  Client reported she still takes on fostering animals which she finds to be  therapeutic for her and helps them to find good homes.  Client discussed that although she has ongoing depressive symptoms it does not appear to help was in the past.  Client reported in the past having thoughts of "not wanting to be here" but not after but not actually harming herself.  Client reported struggling with being hard on herself and discipline to get things done.   Flowsheet Row Counselor from 01/07/2022 in [facility]  PHQ-9 Total Score 10         Suicidal/Homicidal: Nowithout intent/plan  Therapist Response:  Therapist began the appointment asking the client how she has been doing since she was last seen. Therapist used CBT to utilize active listening and positive emotional support towards her thoughts and feelings. Therapist used CBT to engage and ask the client how she has been doing with her daily responsibilities of routine, self care and maintaining physical health. Therapist used CBT to engage with client to educate about the importance of proper nutrition which affects physical functioning and emotional wellness. Therapist assigned client homework to keep a notepad for her to write important thoughts down to discuss at her next appointment, incorporate balanced eating, and engaging in enjoyable activity within capabilities. Client was scheduled for next appointment.     Plan: Return again in 5 weeks.  Diagnosis: Bipolar 1 disorder   Frances Jubilee Matei Magnone, LCSW 01/07/2022

## 2022-01-08 NOTE — Plan of Care (Signed)
Client was in agreement with the treatment plan. °

## 2022-01-10 ENCOUNTER — Encounter: Payer: Self-pay | Admitting: Physician Assistant

## 2022-01-12 ENCOUNTER — Encounter: Payer: Self-pay | Admitting: Physician Assistant

## 2022-01-13 ENCOUNTER — Encounter (HOSPITAL_COMMUNITY): Payer: Self-pay | Admitting: Psychiatry

## 2022-01-13 ENCOUNTER — Telehealth (INDEPENDENT_AMBULATORY_CARE_PROVIDER_SITE_OTHER): Payer: No Payment, Other | Admitting: Psychiatry

## 2022-01-13 DIAGNOSIS — F900 Attention-deficit hyperactivity disorder, predominantly inattentive type: Secondary | ICD-10-CM

## 2022-01-13 DIAGNOSIS — F319 Bipolar disorder, unspecified: Secondary | ICD-10-CM

## 2022-01-13 MED ORDER — BENZTROPINE MESYLATE 0.5 MG PO TABS: 0.5000 mg | ORAL_TABLET | Freq: Every day | ORAL | 3 refills | Status: DC

## 2022-01-13 MED ORDER — BUPROPION HCL ER (XL) 300 MG PO TB24: 300.0000 mg | ORAL_TABLET | Freq: Every day | ORAL | 3 refills | Status: DC

## 2022-01-13 MED ORDER — BUPROPION HCL ER (XL) 150 MG PO TB24: 150.0000 mg | ORAL_TABLET | ORAL | 3 refills | Status: DC

## 2022-01-13 MED ORDER — ARIPIPRAZOLE 15 MG PO TABS: 15.0000 mg | ORAL_TABLET | Freq: Every day | ORAL | 3 refills | Status: DC

## 2022-01-13 NOTE — Progress Notes (Signed)
[place] MD/PA/NP OP Progress Note Virtual Visit via Telephone Note  I connected with Frances [name] on 01/13/22 at  4:00 PM EST by telephone and verified that I am speaking with the correct person using two identifiers.  Location: Patient: home Provider: Clinic   I discussed the limitations, risks, security and privacy concerns of performing an evaluation and management service by telephone and the availability of in person appointments. I also discussed with the patient that there may be a patient responsible charge related to this service. The patient expressed understanding and agreed to proceed.   I provided 30 minutes of non-face-to-face time during this encounter.         01/13/2022 4:02 PM Frances [name]  MRN:  [id number]  Chief Complaint: "I'm doing just fine"  HPI: 46 year old female seen today for follow up psychiatric evaluation. She has a psychiatric history of bipolar 1, borderline personality disorder, depression, and anxiety. She is currently managed on Abilify 15 mg nightly, Cogentin 0.5 mg daily, Wellbutrin 450 mg daily, and Cymbalta 90 mg daily (from rheumatologist). Today, patient notes that medications are effective in managing her symptoms.   Today the patient was unable to login virtually so her assessment was done over the phone. During exam she was pleasant, cooperative, and engaged in conversation She informed provider that since her last visit she has been doing fine.  She however notes that she has been a little more anxious because she has a friend living with her and is concerned about her pending SSI. Patient however reports that these stressors are situational and notes that she is able to cope with them.Today provider conducted a GAD-7 and patient scored a 5, at her last visit she scored a 3.  Provider also conducted PHQ-9 and patient scored a 8, at her last visit she scored a 9.  She endorsee adequate sleep and reduced appetite. Patient reports that  her Ozempic was recently lowered and notes that she has lost 3-5 pounds since her last visit.   Patient informed Probation officer that she has arthritis and fibromyalgia which causes her to be in constant pain.  She informed writer Cymbalta to 90 mg and Lyrica help manage her pain.     No medication changes made today.  Patient agreeable to continue medication as prescribed.  She will follow-up with outpatient counseling for therapy.  No other concerns noted at this time.     Visit Diagnosis:    ICD-10-CM   1. Bipolar I disorder (HCC)  F31.9 ARIPiprazole (ABILIFY) 15 MG tablet    benztropine (COGENTIN) 0.5 MG tablet    buPROPion (WELLBUTRIN XL) 150 MG 24 hr tablet    buPROPion (WELLBUTRIN XL) 300 MG 24 hr tablet    2. Attention deficit hyperactivity disorder (ADHD), predominantly inattentive type  F90.0 buPROPion (WELLBUTRIN XL) 150 MG 24 hr tablet    buPROPion (WELLBUTRIN XL) 300 MG 24 hr tablet      Past Psychiatric History: Bipolar, anxiety, and depression Past Medical History:  Past Medical History:  Diagnosis Date   Anxiety    Arthritis    Back pain, chronic    Bipolar 1 disorder (HCC)    Chronic fatigue syndrome    DDD (degenerative disc disease), lumbar    Depression    Diabetes mellitus without complication ([place])    Fibromyalgia    IUD 2012   Mirena   MTHFR gene mutation     Past Surgical History:  Procedure Laterality Date   COLONOSCOPY  WITH PROPOFOL N/A 02/25/2017   Procedure: COLONOSCOPY WITH PROPOFOL;  Surgeon: Arta Silence, MD;  Location: [facility];  Service: Endoscopy;  Laterality: N/A;   left knee lateral release  1993   scar tisue removal  03/2005   left ankle   TONSILLECTOMY  age 45's    Family Psychiatric History: Maternal aunts Bipolar disorder and uncle alcoholic  Family History:  Family History  Problem Relation Age of Onset   Diabetes Mother    Stroke Mother    Lupus Mother    Hypertension Mother    Congestive Heart Failure Mother    Mental  illness Brother        Not clear what his diagnosis is--may be related to previous drug use.   Cancer Maternal Grandmother        colon   Depression Maternal Uncle    Alcohol abuse Maternal Uncle    Diabetes Maternal Grandfather     Social History:  Social History   Socioeconomic History   Marital status: Single    Spouse name: Not on file   Number of children: 0   Years of education: Not on file   Highest education level: Bachelor's degree (e.g., BA, AB, BS)  Occupational History   Occupation: Designer, industrial/product at times  Tobacco Use   Smoking status: Never   Smokeless tobacco: Never  Vaping Use   Vaping Use: Never used  Substance and Sexual Activity   Alcohol use: No    Alcohol/week: 0.0 standard drinks   Drug use: No   Sexual activity: Yes    Birth control/protection: I.U.D.  Other Topics Concern   Not on file  Social History Narrative   Originally from [place], outside of [place].    Moved here permanently 2012.   Intermittently worked on a farm.   Lives with many cats--3 plus fosters cats.    Single, lives alone in a one story home. Rarely drinks caffeine. 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   Social Determinants of Health   Financial Resource Strain: Not on file  Food Insecurity: Not on file  Transportation Needs: Not on file  Physical Activity: Not on file  Stress: Not on file  Social Connections: Not on file    Allergies:  Allergies  Allergen Reactions   Metaxalone Hives    Metabolic Disorder Labs: Lab Results  Component Value Date   HGBA1C 5.1 12/17/2021   MPG 103 04/14/2018   No results found for: PROLACTIN Lab Results  Component Value Date   CHOL 130 04/14/2018   TRIG 192 (H) 04/14/2018   HDL 28 (L) 04/14/2018   CHOLHDL 4.6 04/14/2018   LDLCALC 73 04/14/2018   LDLCALC 99 03/04/2016   Lab Results  Component Value Date   TSH 0.866 11/21/2020   TSH 1.616 01/27/2018    Therapeutic Level Labs: No results  found for: LITHIUM No results found for: VALPROATE No components found for:  CBMZ  Current Medications: Current Outpatient Medications  Medication Sig Dispense Refill   albuterol (VENTOLIN HFA) 108 (90 Base) MCG/ACT inhaler Take 2 puffs 15 minutes before exercise and as needed for shortness of breath. 6.7 g 1   ARIPiprazole (ABILIFY) 15 MG tablet Take 1 tablet (15 mg total) by mouth daily. 30 tablet 3   atorvastatin (LIPITOR) 10 MG tablet Take 1 tablet (10 mg total) by mouth daily. NEEDS LABS 15 tablet 0   Azelastine HCl 137 MCG/SPRAY SOLN PLACE 2 APPLICATORS INTO ALTERNATE NOSTRILS TWICE A DAY  30 mL 2   benztropine (COGENTIN) 0.5 MG tablet Take 1 tablet (0.5 mg total) by mouth daily. 30 tablet 3   blood glucose meter kit and supplies KIT Dispense based on patient and insurance preference. Use up to four times daily as directed. 1 each 0   buPROPion (WELLBUTRIN XL) 150 MG 24 hr tablet Take 1 tablet (150 mg total) by mouth every morning. 30 tablet 3   buPROPion (WELLBUTRIN XL) 300 MG 24 hr tablet Take 1 tablet (300 mg total) by mouth daily. 30 tablet 3   cholecalciferol (VITAMIN D3) 25 MCG (1000 UT) tablet Take 1,000 Units by mouth daily.     DULoxetine HCl 30 MG CSDR Take 90 mg by mouth daily.     famotidine (PEPCID) 20 MG tablet Take 20 mg by mouth daily.     famotidine (PEPCID) 40 MG tablet Take by mouth.     fluticasone (FLONASE) 50 MCG/ACT nasal spray USE 2 SPRAYS IN EACH NOSTRIL EVERY DAY 16 g 1   HYDROcodone-acetaminophen (NORCO/VICODIN) 5-325 MG tablet Take 1 tablet by mouth 2 (two) times daily as needed for moderate pain (back pain). 60 tablet 0   hydroxychloroquine (PLAQUENIL) 200 MG tablet Take by mouth.     levocetirizine (XYZAL) 5 MG tablet TAKE 1 TABLET BY MOUTH EVERY EVENING 90 tablet 1   metFORMIN (GLUCOPHAGE) 1000 MG tablet TAKE 1 Tablet  BY MOUTH TWICE DAILY WITH A MEAL 180 tablet 1   methocarbamol (ROBAXIN) 500 MG tablet TAKE 1 TABLET (500 MG TOTAL) BY MOUTH 3 (THREE) TIMES  DAILY. 90 tablet 1   montelukast (SINGULAIR) 10 MG tablet TAKE 1 Tablet BY MOUTH ONCE EVERY NIGHT AT BEDTIME 90 tablet 1   pregabalin (LYRICA) 75 MG capsule TAKE 1 CAPSULE BY MOUTH TWICE A DAY 60 capsule 3   Semaglutide,0.25 or 0.5MG/DOS, (OZEMPIC, 0.25 OR 0.5 MG/DOSE,) 2 MG/1.5ML SOPN Inject 0.25 mg into the skin once a week. (Patient taking differently: Inject 0.5 mg into the skin once a week.) 1.5 mL 0   Current Facility-Administered Medications  Medication Dose Route Frequency Provider Last Rate Last Admin   bupivacaine (MARCAINE) 0.5 % (with pres) injection 3 mL  3 mL Other Once Magnus Sinning, MD         Musculoskeletal: Strength & Muscle Tone:  Unable to assess due to telephone visit [place]:  Unable to assess due to telephone visit Patient leans: N/A  Psychiatric Specialty Exam: Review of Systems  There were no vitals taken for this visit.There is no height or weight on file to calculate BMI.  General Appearance:  Unable to assess due to telephone visit  Eye Contact:   Unable to assess due to telephone visit  Speech:  Clear and Coherent and Normal Rate  Volume:  Normal  Mood:  Euthymic  Affect:  Congruent  Thought Process:  Coherent, Goal Directed and Linear  Orientation:  Full (Time, Place, and Person)  Thought Content: WDL and Logical   Suicidal Thoughts:  No  Homicidal Thoughts:  No  Memory:  Immediate;   Good Recent;   Good Remote;   Good  Judgement:  Good  Insight:  Good  Psychomotor Activity:   Unable to assess due to telephone visit  Concentration:  Concentration: Good and Attention Span: Good  Recall:  Good  Fund of Knowledge: Good  Language: Good  Akathisia:   Unable to assess due to telephone visit  Handed:  Right  AIMS (if indicated): Not done  Assets:  Communication Skills Desire for Improvement Financial Resources/Insurance Housing Social Support  ADL's:  Intact  Cognition: WNL  Sleep:  Good   Screenings: AIMS    Flowsheet Row  Admission (Discharged) from 01/25/2018 in [facility]  AIMS Total Score 0      AUDIT    Flowsheet Row Admission (Discharged) from 01/25/2018 in [facility]  Alcohol Use Disorder Identification Test Final Score (AUDIT) 0      GAD-7    Flowsheet Row Video Visit from 01/13/2022 in [facility] Video Visit from 10/28/2021 in [facility] Office Visit from 09/17/2021 in [place] Video Visit from 07/25/2021 in [facility] Video Visit from 04[facility]24/2021 in [facility]  Total GAD-7 Score _0 [id number]    Flowsheet Row Video Visit from 01/13/2022 in [facility] Counselor from 01/07/2022 in [facility] Office Visit from 01/06/2022 in [place] Video Visit from 10/28/2021 in [facility] Office Visit from 09/17/2021 in [place]  PHQ-2 Total Score _1 PHQ-9 Total Score 8 10 -- 9 [place] [facility] from 05/05/2021 in [facility] from 01/30/2021 in [place] No Risk        Assessment and Plan: Patient notes that she is doing well on her current medication regimen.  No medication changes made today.  Patient agreeable to continue medication as prescribed.  1. Bipolar I disorder (HCC)  Continue- ARIPiprazole (ABILIFY) 15 MG tablet; Take 1 tablet (15 mg total) by mouth daily.  Dispense: 30 tablet; Refill: 3 Continue- benztropine (COGENTIN) 0.5 MG tablet; Take 1 tablet (0.5 mg total) by mouth daily.  Dispense: 30 tablet; Refill: 3 Continue- buPROPion (WELLBUTRIN XL) 300 MG 24 hr tablet; Take 1 tablet (300 mg total) by  mouth daily.  Dispense: 30 tablet; Refill: 3 Continue- buPROPion (WELLBUTRIN XL) 150 MG 24 hr tablet; Take 1 tablet (150 mg total) by mouth every morning.  Dispense: 30 tablet; Refill: 3  2. Attention deficit hyperactivity disorder (ADHD), predominantly inattentive type  Continue- buPROPion (WELLBUTRIN XL) 300 MG 24 hr tablet; Take 1 tablet (300 mg total) by mouth daily.  Dispense: 30 tablet; Refill: 3 Continue- buPROPion (WELLBUTRIN XL) 150 MG 24 hr tablet; Take 1 tablet (150 mg total) by mouth every morning.  Dispense: 30 tablet; Refill: 3   Follow up in 3 moths Follow up with therapy   Salley Slaughter, NP 01/13/2022, 4:02 PM

## 2022-01-15 ENCOUNTER — Encounter: Payer: Self-pay | Admitting: Physical Medicine and Rehabilitation

## 2022-01-15 ENCOUNTER — Telehealth: Payer: Self-pay | Admitting: Neurology

## 2022-01-15 ENCOUNTER — Other Ambulatory Visit: Payer: Self-pay

## 2022-01-15 ENCOUNTER — Ambulatory Visit (INDEPENDENT_AMBULATORY_CARE_PROVIDER_SITE_OTHER): Payer: Self-pay | Admitting: Physical Medicine and Rehabilitation

## 2022-01-15 ENCOUNTER — Ambulatory Visit: Payer: Self-pay

## 2022-01-15 VITALS — BP 119/81 | HR 99

## 2022-01-15 DIAGNOSIS — E785 Hyperlipidemia, unspecified: Secondary | ICD-10-CM

## 2022-01-15 DIAGNOSIS — M47816 Spondylosis without myelopathy or radiculopathy, lumbar region: Secondary | ICD-10-CM

## 2022-01-15 MED ORDER — BUPIVACAINE HCL 0.5 % IJ SOLN
3.0000 mL | Freq: Once | INTRAMUSCULAR | Status: AC
  Administered 2022-01-15: 09:00:00 3 mL

## 2022-01-15 NOTE — Addendum Note (Signed)
Addended byAnnamaria Helling on: 01/15/2022 03:51 PM   Modules accepted: Orders

## 2022-01-15 NOTE — Progress Notes (Signed)
HURLEY BLEVINS - 46 y.o. female MRN [id number]  Date of birth: Sep 06, 1976  Office Visit Note: Visit Date: 12/11/2021 PCP: Donella Stade, PA-C Referred by: Jessy Oto, MD  Subjective: Chief Complaint  Patient presents with   Lower Back - Pain   Left Hip - Pain   HPI:  ABRIL CAPPIELLO is a 46 y.o. female who comes in today at the request of Dr. Basil Dess for planned Bilateral  L3-4, L4-5, and L5-S1 Lumbar facet/medial branch block with fluoroscopic guidance.  The patient has failed conservative care including home exercise, medications, time and activity modification.  This injection will be diagnostic and hopefully therapeutic.  Please see requesting physician notes for further details and justification.  Exam has shown concordant pain with facet joint loading.   ROS Otherwise per HPI.  Assessment & Plan: Visit Diagnoses:    ICD-10-CM   1. Spondylosis without myelopathy or radiculopathy, lumbar region  M47.816 XR C-ARM NO REPORT    Facet Injection    bupivacaine (MARCAINE) 0.5 % (with pres) injection 3 mL      Plan: No additional findings.   Meds & Orders:  Meds ordered this encounter  Medications   bupivacaine (MARCAINE) 0.5 % (with pres) injection 3 mL    Orders Placed This Encounter  Procedures   Facet Injection   XR C-ARM NO REPORT    Follow-up: Return for Review Pain Diary.   Procedures: No procedures performed  Lumbar Diagnostic Facet Joint Nerve Block with Fluoroscopic Guidance   Patient: ZISSEL BIEDERMAN      Date of Birth: 08-Apr-1976 MRN: [id number] PCP: Donella Stade, PA-C      Visit Date: 12/11/2021   Universal Protocol:    Date/Time: 01/25/238:47 AM  Consent Given By: the patient  Position: PRONE  Additional Comments: Vital signs were monitored before and after the procedure. Patient was prepped and draped in the usual sterile fashion. The correct patient, procedure, and site was verified.   Injection Procedure Details:    Procedure diagnoses:  1. Spondylosis without myelopathy or radiculopathy, lumbar region      Meds Administered:  Meds ordered this encounter  Medications   bupivacaine (MARCAINE) 0.5 % (with pres) injection 3 mL     Laterality: Bilateral  Location/Site: L3-L4, L2 and L3 medial branches, L4-L5, L3 and L4 medial branches, and L5-S1, L4 medial branch and L5 dorsal ramus  Needle: 5.0 in., 25 ga.  Short bevel or Quincke spinal needle  Needle Placement: Oblique pedical  Findings:   -Comments: There was excellent flow of contrast along the articular pillars without intravascular flow.  Procedure Details: The fluoroscope beam is vertically oriented in AP and then obliqued 15 to 20 degrees to the ipsilateral side of the desired nerve to achieve the Scotty dog appearance.  The skin over the target area of the junction of the superior articulating process and the transverse process (sacral ala if blocking the L5 dorsal rami) was locally anesthetized with a 1 ml volume of 1% Lidocaine without Epinephrine.  The spinal needle was inserted and advanced in a trajectory view down to the target.   After contact with periosteum and negative aspirate for blood and CSF, correct placement without intravascular or epidural spread was confirmed by injecting 0.5 ml. of Isovue-250.  A spot radiograph was obtained of this image.    Next, a 0.5 ml. volume of the injectate described above was injected. The needle was then redirected to the other facet joint nerves  mentioned above if needed.  Prior to the procedure, the patient was given a Pain Diary which was completed for baseline measurements.  After the procedure, the patient rated their pain every 30 minutes and will continue rating at this frequency for a total of 5 hours.  The patient has been asked to complete the Diary and return to [place] by mail, fax or hand delivered as soon as possible.   Additional Comments:  The patient tolerated the procedure  well Dressing: 2 x 2 sterile gauze and Band-Aid    Post-procedure details: Patient was observed during the procedure. Post-procedure instructions were reviewed.  Patient left the clinic in stable condition.    Clinical History: EXAM: MRI LUMBAR SPINE WITHOUT CONTRAST   TECHNIQUE: Multiplanar, multisequence MR imaging of the lumbar spine was performed. No intravenous contrast was administered.   COMPARISON:  MRI 03/24/2020   FINDINGS: Segmentation:  5 lumbar type vertebral bodies.   Alignment: Straightening of the normal lordosis. 2 mm retrolisthesis L4-5.   Vertebrae:  No fracture or primary bone lesion.   Conus medullaris and cauda equina: Conus extends to the L1 level. Conus and cauda equina appear normal.   Paraspinal and other soft tissues: Negative   Disc levels:   T11-12, T12-L1 and L1-2: Normal   L2-3: Desiccation and mild bulging of the disc. Facet degeneration with facet and ligamentous hypertrophy. Mild narrowing of the lateral recesses but no apparent neural compression. No change since prior study.   L3-4: Desiccation and moderate bulging of the disc. Facet and ligamentous hypertrophy. Mild narrowing of the lateral recesses but no visible neural compression. No change since prior study.   L4-5: 2 mm retrolisthesis. Moderate bulging of the disc. Bilateral facet and ligamentous hypertrophy. Stenosis of both subarticular lateral recesses that could cause neural compression on either or both sides. Findings have worsened slightly at this level.   L5-S1: Small central disc protrusion without compressive effect upon the thecal sac or S1 nerves. Mild bilateral facet degeneration. Slight involution of the disc protrusion when compared to the study of last year.   IMPRESSION: L2-3 and L3-4: Disc bulges and facet hypertrophy. Mild stenosis of the lateral recesses without visible neural compression. No significant change since last year. The findings could  certainly relate to back pain or referred facet syndrome pain.   L4-5: 2 mm retrolisthesis. Moderate bulging of the disc. Bilateral facet and ligamentous hypertrophy. Stenosis of both lateral recesses that could cause neural compression on either or both sides. Slight worsening of the findings since last year.   L5-S1: Slight involution of a central disc protrusion. Presently, there is no apparent compressive effect upon the thecal sac or S1 nerves. Mild facet osteoarthritis.     Electronically Signed   By: Nelson Chimes M.D.   On: 05/26/2021 12:46     Objective:  VS:  HT:     WT:    BMI:      BP:100/70   HR:97bpm   TEMP: ( )   RESP:  Physical Exam Vitals and nursing note reviewed.  Constitutional:      General: She is not in acute distress.    Appearance: Normal appearance. She is not ill-appearing.  HENT:     Head: Normocephalic and atraumatic.     Right Ear: External ear normal.     Left Ear: External ear normal.  Eyes:     Extraocular Movements: Extraocular movements intact.  Cardiovascular:     Rate and Rhythm: Normal rate.  Pulses: Normal pulses.  Pulmonary:     Effort: Pulmonary effort is normal. No respiratory distress.  Abdominal:     General: There is no distension.     Palpations: Abdomen is soft.  Musculoskeletal:        General: Tenderness present.     Cervical back: Neck supple.     Right lower leg: No edema.     Left lower leg: No edema.     Comments: Patient has good distal strength with no pain over the greater trochanters.  No clonus or focal weakness. Patient somewhat slow to rise from a seated position to full extension.  There is concordant low back pain with facet loading and lumbar spine extension rotation.  There are no definitive trigger points but the patient is somewhat tender across the lower back and PSIS.  There is no pain with hip rotation.   Skin:    Findings: No erythema, lesion or rash.  Neurological:     General: No focal deficit  present.     Mental Status: She is alert and oriented to person, place, and time.     Sensory: No sensory deficit.     Motor: No weakness or abnormal muscle tone.     Coordination: Coordination normal.  Psychiatric:        Mood and Affect: Mood normal.        Behavior: Behavior normal.     Imaging: No results found.

## 2022-01-15 NOTE — Patient Instructions (Signed)

## 2022-01-15 NOTE — Procedures (Signed)
Lumbar Diagnostic Facet Joint Nerve Block with Fluoroscopic Guidance   Patient: Frances [name]      Date of Birth: Jan 25, 1976 MRN: [id number] PCP: Donella Stade, PA-C      Visit Date: 01/15/2022   Universal Protocol:    Date/Time: 01/25/238:48 AM  Consent Given By: the patient  Position: PRONE  Additional Comments: Vital signs were monitored before and after the procedure. Patient was prepped and draped in the usual sterile fashion. The correct patient, procedure, and site was verified.   Injection Procedure Details:   Procedure diagnoses:  1. Spondylosis without myelopathy or radiculopathy, lumbar region      Meds Administered:  Meds ordered this encounter  Medications   bupivacaine (MARCAINE) 0.5 % (with pres) injection 3 mL     Laterality: Bilateral  Location/Site: L3-L4, L2 and L3 medial branches, L4-L5, L3 and L4 medial branches, and L5-S1, L4 medial branch and L5 dorsal ramus  Needle: 5.0 in., 25 ga.  Short bevel or Quincke spinal needle  Needle Placement: Oblique pedical  Findings:   -Comments: There was excellent flow of contrast along the articular pillars without intravascular flow.  Procedure Details: The fluoroscope beam is vertically oriented in AP and then obliqued 15 to 20 degrees to the ipsilateral side of the desired nerve to achieve the Scotty dog appearance.  The skin over the target area of the junction of the superior articulating process and the transverse process (sacral ala if blocking the L5 dorsal rami) was locally anesthetized with a 1 ml volume of 1% Lidocaine without Epinephrine.  The spinal needle was inserted and advanced in a trajectory view down to the target.   After contact with periosteum and negative aspirate for blood and CSF, correct placement without intravascular or epidural spread was confirmed by injecting 0.5 ml. of Isovue-250.  A spot radiograph was obtained of this image.    Next, a 0.5 ml. volume of the injectate  described above was injected. The needle was then redirected to the other facet joint nerves mentioned above if needed.  Prior to the procedure, the patient was given a Pain Diary which was completed for baseline measurements.  After the procedure, the patient rated their pain every 30 minutes and will continue rating at this frequency for a total of 5 hours.  The patient has been asked to complete the Diary and return to [place] by mail, fax or hand delivered as soon as possible.   Additional Comments:  The patient tolerated the procedure well Dressing: 2 x 2 sterile gauze and Band-Aid    Post-procedure details: Patient was observed during the procedure. Post-procedure instructions were reviewed.  Patient left the clinic in stable condition.

## 2022-01-15 NOTE — Progress Notes (Signed)
Pt state lower back pain that travels to her left hip and leg. Pt state walking, sitting and standing makes the pain worse. Pt state she takes pain meds and lay in bed to help ease her pain. Pt has hx of inj on 12/11/21 pt state it helped.  Numeric Pain Rating Scale and Functional Assessment Average Pain 3   In the last MONTH (on 0-10 scale) has pain interfered with the following?  1. General activity like being  able to carry out your everyday physical activities such as walking, climbing stairs, carrying groceries, or moving a chair?  Rating(8)   +Driver, -BT, -Dye Allergies.

## 2022-01-15 NOTE — Progress Notes (Signed)
Frances [name] - 46 y.o. female MRN [id number]  Date of birth: 02/04/1976  Office Visit Note: Visit Date: 01/15/2022 PCP: Donella Stade, PA-C Referred by: Donella Stade, PA-C  Subjective: Chief Complaint  Patient presents with   Lower Back - Pain   Left Hip - Pain   Left Leg - Pain   HPI:  Frances [name] is a 46 y.o. female who comes in today for planned repeat Bilateral L3-4, L4-5, and L5-S1 Lumbar facet/medial branch block with fluoroscopic guidance.  The patient has failed conservative care including home exercise, medications, time and activity modification.  This injection will be diagnostic and hopefully therapeutic.  Please see requesting physician notes for further details and justification.  Exam shows concordant low back pain with facet joint loading and extension. Patient received more than 80% pain relief from prior injection. This would be the second block in a diagnostic double block paradigm.     Referring:Dr. Basil Dess   ROS Otherwise per HPI.  Assessment & Plan: Visit Diagnoses:    ICD-10-CM   1. Spondylosis without myelopathy or radiculopathy, lumbar region  M47.816 XR C-ARM NO REPORT    Facet Injection    bupivacaine (MARCAINE) 0.5 % (with pres) injection 3 mL      Plan: No additional findings.   Meds & Orders:  Meds ordered this encounter  Medications   bupivacaine (MARCAINE) 0.5 % (with pres) injection 3 mL    Orders Placed This Encounter  Procedures   Facet Injection   XR C-ARM NO REPORT    Follow-up: Return for Review Pain Diary.   Procedures: No procedures performed  Lumbar Diagnostic Facet Joint Nerve Block with Fluoroscopic Guidance   Patient: Frances [name]      Date of Birth: 1976-08-01 MRN: [id number] PCP: Donella Stade, PA-C      Visit Date: 01/15/2022   Universal Protocol:    Date/Time: 01/25/238:48 AM  Consent Given By: the patient  Position: PRONE  Additional Comments: Vital signs were monitored  before and after the procedure. Patient was prepped and draped in the usual sterile fashion. The correct patient, procedure, and site was verified.   Injection Procedure Details:   Procedure diagnoses:  1. Spondylosis without myelopathy or radiculopathy, lumbar region      Meds Administered:  Meds ordered this encounter  Medications   bupivacaine (MARCAINE) 0.5 % (with pres) injection 3 mL     Laterality: Bilateral  Location/Site: L3-L4, L2 and L3 medial branches, L4-L5, L3 and L4 medial branches, and L5-S1, L4 medial branch and L5 dorsal ramus  Needle: 5.0 in., 25 ga.  Short bevel or Quincke spinal needle  Needle Placement: Oblique pedical  Findings:   -Comments: There was excellent flow of contrast along the articular pillars without intravascular flow.  Procedure Details: The fluoroscope beam is vertically oriented in AP and then obliqued 15 to 20 degrees to the ipsilateral side of the desired nerve to achieve the Scotty dog appearance.  The skin over the target area of the junction of the superior articulating process and the transverse process (sacral ala if blocking the L5 dorsal rami) was locally anesthetized with a 1 ml volume of 1% Lidocaine without Epinephrine.  The spinal needle was inserted and advanced in a trajectory view down to the target.   After contact with periosteum and negative aspirate for blood and CSF, correct placement without intravascular or epidural spread was confirmed by injecting 0.5 ml. of Isovue-250.  A spot radiograph  was obtained of this image.    Next, a 0.5 ml. volume of the injectate described above was injected. The needle was then redirected to the other facet joint nerves mentioned above if needed.  Prior to the procedure, the patient was given a Pain Diary which was completed for baseline measurements.  After the procedure, the patient rated their pain every 30 minutes and will continue rating at this frequency for a total of 5 hours.   The patient has been asked to complete the Diary and return to [place] by mail, fax or hand delivered as soon as possible.   Additional Comments:  The patient tolerated the procedure well Dressing: 2 x 2 sterile gauze and Band-Aid    Post-procedure details: Patient was observed during the procedure. Post-procedure instructions were reviewed.  Patient left the clinic in stable condition.    Clinical History: EXAM: MRI LUMBAR SPINE WITHOUT CONTRAST   TECHNIQUE: Multiplanar, multisequence MR imaging of the lumbar spine was performed. No intravenous contrast was administered.   COMPARISON:  MRI 03/24/2020   FINDINGS: Segmentation:  5 lumbar type vertebral bodies.   Alignment: Straightening of the normal lordosis. 2 mm retrolisthesis L4-5.   Vertebrae:  No fracture or primary bone lesion.   Conus medullaris and cauda equina: Conus extends to the L1 level. Conus and cauda equina appear normal.   Paraspinal and other soft tissues: Negative   Disc levels:   T11-12, T12-L1 and L1-2: Normal   L2-3: Desiccation and mild bulging of the disc. Facet degeneration with facet and ligamentous hypertrophy. Mild narrowing of the lateral recesses but no apparent neural compression. No change since prior study.   L3-4: Desiccation and moderate bulging of the disc. Facet and ligamentous hypertrophy. Mild narrowing of the lateral recesses but no visible neural compression. No change since prior study.   L4-5: 2 mm retrolisthesis. Moderate bulging of the disc. Bilateral facet and ligamentous hypertrophy. Stenosis of both subarticular lateral recesses that could cause neural compression on either or both sides. Findings have worsened slightly at this level.   L5-S1: Small central disc protrusion without compressive effect upon the thecal sac or S1 nerves. Mild bilateral facet degeneration. Slight involution of the disc protrusion when compared to the study of last year.    IMPRESSION: L2-3 and L3-4: Disc bulges and facet hypertrophy. Mild stenosis of the lateral recesses without visible neural compression. No significant change since last year. The findings could certainly relate to back pain or referred facet syndrome pain.   L4-5: 2 mm retrolisthesis. Moderate bulging of the disc. Bilateral facet and ligamentous hypertrophy. Stenosis of both lateral recesses that could cause neural compression on either or both sides. Slight worsening of the findings since last year.   L5-S1: Slight involution of a central disc protrusion. Presently, there is no apparent compressive effect upon the thecal sac or S1 nerves. Mild facet osteoarthritis.     Electronically Signed   By: Nelson Chimes M.D.   On: 05/26/2021 12:46     Objective:  VS:  HT:     WT:    BMI:      BP:119/81   HR:99bpm   TEMP: ( )   RESP:  Physical Exam Vitals and nursing note reviewed.  Constitutional:      General: She is not in acute distress.    Appearance: Normal appearance. She is not ill-appearing.  HENT:     Head: Normocephalic and atraumatic.     Right Ear: External ear normal.  Left Ear: External ear normal.  Eyes:     Extraocular Movements: Extraocular movements intact.  Cardiovascular:     Rate and Rhythm: Normal rate.     Pulses: Normal pulses.  Pulmonary:     Effort: Pulmonary effort is normal. No respiratory distress.  Abdominal:     General: There is no distension.     Palpations: Abdomen is soft.  Musculoskeletal:        General: Tenderness present.     Cervical back: Neck supple.     Right lower leg: No edema.     Left lower leg: No edema.     Comments: Patient has good distal strength with no pain over the greater trochanters.  No clonus or focal weakness. Patient somewhat slow to rise from a seated position to full extension.  There is concordant low back pain with facet loading and lumbar spine extension rotation.  There are no definitive trigger points but  the patient is somewhat tender across the lower back and PSIS.  There is no pain with hip rotation.   Skin:    Findings: No erythema, lesion or rash.  Neurological:     General: No focal deficit present.     Mental Status: She is alert and oriented to person, place, and time.     Sensory: No sensory deficit.     Motor: No weakness or abnormal muscle tone.     Coordination: Coordination normal.  Psychiatric:        Mood and Affect: Mood normal.        Behavior: Behavior normal.     Imaging: No results found.

## 2022-01-15 NOTE — Telephone Encounter (Signed)
Patient called to confirm she needs labs. LVM. Called her back and LVM letting her know she does need lipid panel, ordered, and she can come by when fasting to have completed.

## 2022-01-15 NOTE — Procedures (Signed)
Lumbar Diagnostic Facet Joint Nerve Block with Fluoroscopic Guidance   Patient: Frances [name]      Date of Birth: [birth year] MRN: [id number] PCP: Donella Stade, PA-C      Visit Date: 12/11/2021   Universal Protocol:    Date/Time: 01/25/238:47 AM  Consent Given By: the patient  Position: PRONE  Additional Comments: Vital signs were monitored before and after the procedure. Patient was prepped and draped in the usual sterile fashion. The correct patient, procedure, and site was verified.   Injection Procedure Details:   Procedure diagnoses:  1. Spondylosis without myelopathy or radiculopathy, lumbar region      Meds Administered:  Meds ordered this encounter  Medications   bupivacaine (MARCAINE) 0.5 % (with pres) injection 3 mL     Laterality: Bilateral  Location/Site: L3-L4, L2 and L3 medial branches, L4-L5, L3 and L4 medial branches, and L5-S1, L4 medial branch and L5 dorsal ramus  Needle: 5.0 in., 25 ga.  Short bevel or Quincke spinal needle  Needle Placement: Oblique pedical  Findings:   -Comments: There was excellent flow of contrast along the articular pillars without intravascular flow.  Procedure Details: The fluoroscope beam is vertically oriented in AP and then obliqued 15 to 20 degrees to the ipsilateral side of the desired nerve to achieve the Scotty dog appearance.  The skin over the target area of the junction of the superior articulating process and the transverse process (sacral ala if blocking the L5 dorsal rami) was locally anesthetized with a 1 ml volume of 1% Lidocaine without Epinephrine.  The spinal needle was inserted and advanced in a trajectory view down to the target.   After contact with periosteum and negative aspirate for blood and CSF, correct placement without intravascular or epidural spread was confirmed by injecting 0.5 ml. of Isovue-250.  A spot radiograph was obtained of this image.    Next, a 0.5 ml. volume of the injectate  described above was injected. The needle was then redirected to the other facet joint nerves mentioned above if needed.  Prior to the procedure, the patient was given a Pain Diary which was completed for baseline measurements.  After the procedure, the patient rated their pain every 30 minutes and will continue rating at this frequency for a total of 5 hours.  The patient has been asked to complete the Diary and return to [place] by mail, fax or hand delivered as soon as possible.   Additional Comments:  The patient tolerated the procedure well Dressing: 2 x 2 sterile gauze and Band-Aid    Post-procedure details: Patient was observed during the procedure. Post-procedure instructions were reviewed.  Patient left the clinic in stable condition.

## 2022-01-21 ENCOUNTER — Other Ambulatory Visit: Payer: Self-pay | Admitting: Physician Assistant

## 2022-01-21 LAB — LIPID PANEL
Chol/HDL Ratio: 3.4 ratio (ref 0.0–4.4)
Cholesterol, Total: 157 mg/dL (ref 100–199)
HDL: 46 mg/dL (ref >39)
LDL Chol Calc (NIH): 89 mg/dL (ref 0–99)
Triglycerides: 124 mg/dL (ref 0–149)
VLDL Cholesterol Cal: 22 mg/dL (ref 5–40)

## 2022-01-21 MED ORDER — ATORVASTATIN CALCIUM 20 MG PO TABS: 20.0000 mg | ORAL_TABLET | Freq: Every day | ORAL | 3 refills | Status: DC

## 2022-01-21 MED FILL — CYMBALTA 30 MG CAPSULE,DELAYED RELEASE: ORAL | 30 days supply | Qty: 90 | Fill #5

## 2022-01-21 MED FILL — HYDROXYCHLOROQUINE 200 MG TABLET: ORAL | 30 days supply | Qty: 60 | Fill #2

## 2022-01-21 NOTE — Progress Notes (Signed)
LDL not to goal of under 70. Increased lipitor to 20mg  daily. Sent to pharmacy.

## 2022-01-23 ENCOUNTER — Other Ambulatory Visit: Payer: Self-pay | Admitting: Physical Medicine and Rehabilitation

## 2022-01-23 ENCOUNTER — Telehealth: Payer: Self-pay | Admitting: Physical Medicine and Rehabilitation

## 2022-01-23 DIAGNOSIS — M47816 Spondylosis without myelopathy or radiculopathy, lumbar region: Secondary | ICD-10-CM

## 2022-01-23 NOTE — Telephone Encounter (Signed)
Patient returned call asked for a call back.   Ph# [phone]

## 2022-02-17 ENCOUNTER — Telehealth: Payer: Self-pay | Admitting: *Deleted

## 2022-02-17 DIAGNOSIS — M1990 Unspecified osteoarthritis, unspecified site: Principal | ICD-10-CM

## 2022-02-17 DIAGNOSIS — M797 Fibromyalgia: Principal | ICD-10-CM

## 2022-02-17 MED ORDER — DULOXETINE 30 MG CAPSULE,DELAYED RELEASE
ORAL_CAPSULE | Freq: Every day | ORAL | 5 refills | 30 days | Status: CP
Start: 2022-02-17 — End: ?
  Filled 2022-02-19: qty 90, 30d supply, fill #0

## 2022-02-17 MED ORDER — DICLOFENAC SODIUM 75 MG TABLET,DELAYED RELEASE
ORAL_TABLET | Freq: Two times a day (BID) | ORAL | 11 refills | 30 days | Status: CP
Start: 2022-02-17 — End: 2023-02-17
  Filled 2022-02-19: qty 60, 30d supply, fill #0

## 2022-02-17 NOTE — Chronic Care Management (AMB) (Unsigned)
°  Care Management   Note  02/17/2022 Name: Frances [name] MRN: [id number] DOB: [birth year]  Frances [name] is a 46 y.o. year old female who is a primary care patient of Lavada Mesi and is actively engaged with the care management team. I reached out to Frances [name] by phone today to assist with re-scheduling a follow up visit with the Pharmacist  Follow up plan: Unsuccessful telephone outreach attempt made. A HIPAA compliant phone message was left for the patient providing contact information and requesting a return call.   Julian Hy, [facility]  Direct Dial: [phone]

## 2022-02-19 ENCOUNTER — Other Ambulatory Visit: Payer: Self-pay | Admitting: Physician Assistant

## 2022-02-19 ENCOUNTER — Telehealth: Payer: Self-pay | Admitting: Physician Assistant

## 2022-02-19 DIAGNOSIS — J310 Chronic rhinitis: Secondary | ICD-10-CM

## 2022-02-19 DIAGNOSIS — R0989 Other specified symptoms and signs involving the circulatory and respiratory systems: Secondary | ICD-10-CM

## 2022-02-19 DIAGNOSIS — J3489 Other specified disorders of nose and nasal sinuses: Secondary | ICD-10-CM

## 2022-02-19 MED FILL — HYDROXYCHLOROQUINE 200 MG TABLET: ORAL | 30 days supply | Qty: 60 | Fill #3

## 2022-02-19 NOTE — Telephone Encounter (Signed)
Patient called in to reschedule appt that was on 4/19 with pharmacist please advise.  ?

## 2022-02-21 ENCOUNTER — Ambulatory Visit (INDEPENDENT_AMBULATORY_CARE_PROVIDER_SITE_OTHER): Payer: No Payment, Other | Admitting: Clinical

## 2022-02-21 DIAGNOSIS — F319 Bipolar disorder, unspecified: Secondary | ICD-10-CM | POA: Diagnosis not present

## 2022-02-21 NOTE — Progress Notes (Signed)
? ?THERAPIST PROGRESS NOTE ?Virtual Visit via Video Note ? ?I connected with Frances [name] on 02/21/22 at 10:00 AM EST by a video enabled telemedicine application and verified that I am speaking with the correct person using two identifiers. ? ?Location: ?Patient: home ?Provider: office ?  ?I discussed the limitations of evaluation and management by telemedicine and the availability of in person appointments. The patient expressed understanding and agreed to proceed. ? ? ?Follow Up Instructions: ?I discussed the assessment and treatment plan with the patient. The patient was provided an opportunity to ask questions and all were answered. The patient agreed with the plan and demonstrated an understanding of the instructions. ?  ?The patient was advised to call back or seek an in-person evaluation if the symptoms worsen or if the condition fails to improve as anticipated. ? ? ?Session Time: 30 minutes ? ?Participation Level: Active ? ?Behavioral Response: CasualAlertDepressed ? ?Type of Therapy: Individual Therapy ? ?Treatment Goals addressed: WILL SCORE LESS THAN 10 ON THE PATIENT HEALTH QUESTIONNAIRE (PHQ-9) ? ?ProgressTowards Goals: Progressing ? ?Interventions: CBT and Supportive ? ?Summary:  ?Frances [name] is a 46 y.o. female who presents for the scheduled appointment oriented times five, appropriately dressed, and friendly. Client denied hallucinations and delusions. ?Client reported on today she is doing fairly well. Client reported she still has a friend living with her which has become stressful. Client reported her friend is not making enough efforts to find herself housing. Client reported otherwise she has been managing her depressive symptoms fairly well. Client rated severity of her depression at a 3 but has episodic periods of passive suicidal ideations but, without plan or intent. Client reported she has been having chronic pain in her back related to her severe arthritis diagnosis. Client  reported she will be meeting with a new rheumatologist soon. Client reported she has difficulty with walking and getting out the bed. Client reported the pain to be an 8. Client reported she has also been working on managing figuring out ways to help manage her health. Client reported her blood sugar levels have been stable but still needs to work on improving her eating habits. ? ? ?Evidence of progress towards goal:  Client reported she is doing well with finding coping skills to help manage her depression at least 3 out of 7 days per week. Client reported she rides her horse, fosters kittens, and will go outside for sunlight when the wether is nice. Client reported sunlight helps to improve her mood. Client PHQ9 score is less than 10. ? ?Flowsheet Row Counselor from 02/21/2022 in [facility]  ?PHQ-9 Total Score 8  ? ?  ?  ?Suicidal/Homicidal: Nowithout intent/plan ? ?Therapist Response:  ?Therapist began the session asking the client how she has been doing since last seen. ?Therapist used CBT to engage in active listening and positive emotional support. ?Therapist used CBT to engage ask the client about the severity of her depression and its affect on her daily activity. ?Therapist used CBT to ask the client to describe other psychosocial stressors that negatively impact her mood.  ?Therapist used CBT to engage and ask the client about treatment goal progress. ?Therapist completed Phq-9. ?Therapist assigned the client homework to continue using her coping skills and to brainstorm simple meal prepping ideas to incorporate into her meals. ?Client was scheduled for next appointment. ? ? ? ? ?Plan: Return again in 4 weeks. ? ?Diagnosis: bipolar 1 disorder ? ?Collaboration of Care: Patient refused AEB none  reported by client at this time. ? ?Patient/Guardian was advised Release of Information must be obtained prior to any record release in order to collaborate their care with an outside  provider. Patient/Guardian was advised if they have not already done so to contact the registration department to sign all necessary forms in order for [place] to release information regarding their care.  ? ?Consent: Patient/Guardian gives verbal consent for treatment and assignment of benefits for services provided during this visit. Patient/Guardian expressed understanding and agreed to proceed.  ? ?Birdena Jubilee Bellanie Matthew, LCSW ?02/21/2022 ? ?

## 2022-03-10 ENCOUNTER — Other Ambulatory Visit: Payer: Self-pay

## 2022-03-10 ENCOUNTER — Ambulatory Visit: Payer: Self-pay

## 2022-03-10 ENCOUNTER — Ambulatory Visit (INDEPENDENT_AMBULATORY_CARE_PROVIDER_SITE_OTHER): Payer: Self-pay | Admitting: Physical Medicine and Rehabilitation

## 2022-03-10 VITALS — BP 116/81 | HR 99 | Temp 98.3°F

## 2022-03-10 DIAGNOSIS — M47816 Spondylosis without myelopathy or radiculopathy, lumbar region: Secondary | ICD-10-CM

## 2022-03-10 MED ORDER — METHYLPREDNISOLONE ACETATE 80 MG/ML IJ SUSP: 80.0000 mg | Freq: Once | INTRAMUSCULAR | Status: DC

## 2022-03-10 NOTE — Progress Notes (Signed)
Low back pain with some left hip pain ?+Driver ?-No Blood Thinners  ?

## 2022-03-10 NOTE — Patient Instructions (Signed)

## 2022-03-12 ENCOUNTER — Ambulatory Visit: Admit: 2022-03-12 | Discharge: 2022-03-13

## 2022-03-12 ENCOUNTER — Ambulatory Visit: Admit: 2022-03-12 | Discharge: 2022-03-13 | Attending: Family | Primary: Family

## 2022-03-12 DIAGNOSIS — Z79899 Other long term (current) drug therapy: Principal | ICD-10-CM

## 2022-03-12 DIAGNOSIS — M79642 Pain in left hand: Principal | ICD-10-CM

## 2022-03-12 DIAGNOSIS — M79641 Pain in right hand: Principal | ICD-10-CM

## 2022-03-14 ENCOUNTER — Other Ambulatory Visit: Payer: Self-pay | Admitting: Physician Assistant

## 2022-03-14 ENCOUNTER — Telehealth (HOSPITAL_COMMUNITY): Payer: Self-pay | Admitting: *Deleted

## 2022-03-14 ENCOUNTER — Ambulatory Visit (INDEPENDENT_AMBULATORY_CARE_PROVIDER_SITE_OTHER): Payer: No Payment, Other | Admitting: Clinical

## 2022-03-14 DIAGNOSIS — F319 Bipolar disorder, unspecified: Secondary | ICD-10-CM | POA: Diagnosis not present

## 2022-03-14 NOTE — Telephone Encounter (Signed)
Fax requests from [facility] for new rx for her abilify and cogentin. I spoke with [place], as these two rxs were sent to them and she has refills there why [facility] would be requesting [place] to send to them. 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 Will forward the request for these 2 meds to go to [facility] per Ce Ce NP who is covering [facility] patients while she is out on leave. ?

## 2022-03-15 NOTE — Progress Notes (Signed)
? ?  THERAPIST PROGRESS NOTE ?Virtual Visit via Video Note ? ?I connected with Para March on 03/14/2022 at 11:00 AM EDT by a video enabled telemedicine application and verified that I am speaking with the correct person using two identifiers. ? ?Location: ?Patient: home ?Provider: office ?  ?I discussed the limitations of evaluation and management by telemedicine and the availability of in person appointments. The patient expressed understanding and agreed to proceed. ? ?Follow Up Instructions: ?I discussed the assessment and treatment plan with the patient. The patient was provided an opportunity to ask questions and all were answered. The patient agreed with the plan and demonstrated an understanding of the instructions. ?  ?The patient was advised to call back or seek an in-person evaluation if the symptoms worsen or if the condition fails to improve as anticipated. ? ? ? ?Session Time: 30 minutes ? ?Participation Level: Active ? ?Behavioral Response: CasualAlertEuthymic ? ?Type of Therapy: Individual Therapy ? ?Treatment Goals addressed: complete 80% of homework ? ?ProgressTowards Goals: Progressing ? ?Interventions: CBT and Supportive ? ?Summary:  ?MAJORIE SANTEE is a 46 y.o. female who presents for the scheduled session oriented times five, appropriately dressed, and friendly. Client denied hallucinations and delusions. ?Client reported on today she is doing fairly well. Client reported since her last appointment she has given her friend a 30 day notice to move out. Client reported what was inially supposed to last for approx 2 weeks has gone on longer than that. Client reported she friend has not been putting in enough effort looking for a place. Client reported if she doesn't find a place she will tell her to go stay with her wife. Client reported she has noticed otherwise that her memory is getting bad. Client reported she may start ADHD medication to help but regardless she will speak with her  primary care doctor about her symptoms.  ?Evidence of progress towards goal:  Client reported her depression has been a 3 out of 10 regarding severity. Client reported at least 3 days out of 7 practicing positive self talk. ? ? ?Suicidal/Homicidal: Nowithout intent/plan ? ?Therapist Response:  ?Therapist began the appointment asking the client how she has been doing since last seen. ?Therapist used CBT to use active listening and positive emotional support. ?Therapist used CBT to engage and discuss behavioral strategies to use to reduces feelings of depression. ?Therapist used CBT ask the client to identify her progress with frequency of use with coping skills with continued practice in her daily activity.    ?Client was assignment homework to practice using self compassion. ?Client was scheduled for next appointment. ? ? ? ? ?Plan: Return again in 5 weeks. ? ?Diagnosis: bipolar 1 disorder ? ?Collaboration of Care: Patient refused AEB none requested by the client. ? ?Patient/Guardian was advised Release of Information must be obtained prior to any record release in order to collaborate their care with an outside provider. Patient/Guardian was advised if they have not already done so to contact the registration department to sign all necessary forms in order for [place] to release information regarding their care.  ? ?Consent: Patient/Guardian gives verbal consent for treatment and assignment of benefits for services provided during this visit. Patient/Guardian expressed understanding and agreed to proceed.  ? ?Birdena Jubilee Dyani Babel, LCSW ?03/14/2022 ? ?

## 2022-03-17 ENCOUNTER — Other Ambulatory Visit: Payer: Self-pay

## 2022-03-17 ENCOUNTER — Ambulatory Visit: Payer: Self-pay

## 2022-03-17 ENCOUNTER — Ambulatory Visit (INDEPENDENT_AMBULATORY_CARE_PROVIDER_SITE_OTHER): Payer: Self-pay | Admitting: Physical Medicine and Rehabilitation

## 2022-03-17 ENCOUNTER — Other Ambulatory Visit: Payer: Self-pay | Admitting: Physician Assistant

## 2022-03-17 ENCOUNTER — Encounter: Payer: Self-pay | Admitting: Physical Medicine and Rehabilitation

## 2022-03-17 ENCOUNTER — Ambulatory Visit (INDEPENDENT_AMBULATORY_CARE_PROVIDER_SITE_OTHER): Payer: Self-pay | Admitting: Physician Assistant

## 2022-03-17 ENCOUNTER — Other Ambulatory Visit (HOSPITAL_COMMUNITY): Payer: Self-pay | Admitting: Psychiatry

## 2022-03-17 ENCOUNTER — Encounter: Payer: Self-pay | Admitting: Physician Assistant

## 2022-03-17 VITALS — BP 122/80 | HR 83

## 2022-03-17 VITALS — BP 108/81 | HR 97 | Ht 67.0 in | Wt 157.1 lb

## 2022-03-17 DIAGNOSIS — F319 Bipolar disorder, unspecified: Secondary | ICD-10-CM

## 2022-03-17 DIAGNOSIS — M545 Low back pain, unspecified: Secondary | ICD-10-CM

## 2022-03-17 DIAGNOSIS — R0781 Pleurodynia: Secondary | ICD-10-CM

## 2022-03-17 DIAGNOSIS — M47816 Spondylosis without myelopathy or radiculopathy, lumbar region: Secondary | ICD-10-CM

## 2022-03-17 DIAGNOSIS — F900 Attention-deficit hyperactivity disorder, predominantly inattentive type: Secondary | ICD-10-CM

## 2022-03-17 DIAGNOSIS — R3912 Poor urinary stream: Secondary | ICD-10-CM | POA: Insufficient documentation

## 2022-03-17 DIAGNOSIS — M25551 Pain in right hip: Secondary | ICD-10-CM

## 2022-03-17 DIAGNOSIS — M940 Chondrocostal junction syndrome [Tietze]: Secondary | ICD-10-CM

## 2022-03-17 DIAGNOSIS — R809 Proteinuria, unspecified: Secondary | ICD-10-CM

## 2022-03-17 DIAGNOSIS — E119 Type 2 diabetes mellitus without complications: Secondary | ICD-10-CM

## 2022-03-17 DIAGNOSIS — M0609 Rheumatoid arthritis without rheumatoid factor, multiple sites: Secondary | ICD-10-CM

## 2022-03-17 DIAGNOSIS — M5136 Other intervertebral disc degeneration, lumbar region: Secondary | ICD-10-CM

## 2022-03-17 DIAGNOSIS — G8929 Other chronic pain: Secondary | ICD-10-CM

## 2022-03-17 DIAGNOSIS — M0600 Rheumatoid arthritis without rheumatoid factor, unspecified site: Principal | ICD-10-CM

## 2022-03-17 LAB — POCT GLYCOSYLATED HEMOGLOBIN (HGB A1C): Hemoglobin A1C: 5.3 % (ref 4.0–5.6)

## 2022-03-17 LAB — POCT URINALYSIS DIP (CLINITEK)
Bilirubin, UA: NEGATIVE
Blood, UA: NEGATIVE
Glucose, UA: NEGATIVE mg/dL
Leukocytes, UA: NEGATIVE
Nitrite, UA: NEGATIVE
Spec Grav, UA: >=1.03 — AB (ref 1.010–1.025)
Urobilinogen, UA: 1 E.U./dL
pH, UA: 7.5 (ref 5.0–8.0)

## 2022-03-17 MED ORDER — DICLOFENAC SODIUM 75 MG TABLET,DELAYED RELEASE
ORAL_TABLET | Freq: Two times a day (BID) | ORAL | 2 refills | 30 days | Status: CP
Start: 2022-03-17 — End: 2022-06-15
  Filled 2022-03-20: qty 180, 90d supply, fill #0

## 2022-03-17 MED ORDER — METHYLPREDNISOLONE ACETATE 80 MG/ML IJ SUSP
80.0000 mg | Freq: Once | INTRAMUSCULAR | Status: AC
  Administered 2022-03-17: 80 mg

## 2022-03-17 MED ORDER — HYDROCODONE-ACETAMINOPHEN 5-325 MG PO TABS: 1.0000 | ORAL_TABLET | Freq: Two times a day (BID) | ORAL | 0 refills | Status: DC | PRN

## 2022-03-17 MED ORDER — BACLOFEN 10 MG PO TABS: 10.0000 mg | ORAL_TABLET | Freq: Three times a day (TID) | ORAL | 0 refills | Status: DC | PRN

## 2022-03-17 NOTE — Progress Notes (Signed)
? ?Subjective:  ? ? Patient ID: Frances [name], female    DOB: Nov 24, 1976, 46 y.o.   MRN: [id number] ? ?HPI ?Pt is a 46 yo female with T2DM, CFS, Chronic upper and low back pain, RA who presents to the clinic for 3 month follow up.  ? ?Pt is checking her sugars in the morning and ranging 90-110. Denies any CP, palpitations, headaches, or vision changes. Frances [name] denies any leg cramping or swelling. No open sores or wounds. No hypoglycemic events. On ozempic .'25mg'$  and metformin. No concerns.  ? ?Frances [name] is having some difficultly starting her urine stream for the last few weeks. No abdominal pain, flank pain, fever, chills, dysuria, vaginal discharge.  ? ?Chronic pain needs norco refilled. Frances [name] is taking one to two a day as needed for pain. Last fill was 01/22/2022.  ? ?.. ?Active Ambulatory Problems  ?  Diagnosis Date Noted  ? 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 DERMATITIS, ALLERGIC 06/05/2008  ? DDD (degenerative disc disease), lumbar 02/10/2008  ? Back pain 10/13/2008  ? FIBROMYALGIA 02/10/2008  ? FATIGUE 05/05/2008  ? IRON, SERUM, ELEVATED 02/10/2008  ? DYSPNEA 10/15/2011  ? Bipolar I disorder ([place]) 09/16/2017  ? Fibromyalgia 09/16/2017  ? Chronic fatigue syndrome 09/16/2017  ? MTHFR gene mutation 09/20/2017  ? TMJ (temporomandibular joint syndrome) 09/26/2017  ? New daily persistent headache 09/26/2017  ? Rheumatoid arthritis 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 ([place]) 12/09/2017  ? Anxiety 01/22/2018  ? Borderline personality disorder ([place]) 01/22/2018  ? Depression   ? Elevated fasting glucose 04/08/2018  ? Decreased visual acuity 08/30/2018  ? Right corneal abrasion 08/30/2018  ? Toenail fungus 09/09/2019  ? Vasovagal response 09/09/2019  ? Grief reaction 11/28/2019  ? Labral tear of hip, degenerative, right 01/11/2020  ? DDD (degenerative disc disease), cervical 03/13/2020  ? Ingrowing left great toenail 03/13/2020  ? Recurrent major depressive disorder, in partial remission ([place]) 05/24/2020  ?  Generalized anxiety disorder 05/24/2020  ? Macromastia 06/29/2020  ? Xiphoid pain 08/13/2020  ? Sternum pain 08/13/2020  ? Class 1 obesity due to excess calories without serious comorbidity with body mass index (BMI) of 32.0 to 32.9 in adult 08/13/2020  ? Symptomatic mammary hypertrophy 09/04/2020  ? Costochondritis 10/22/2020  ? Rib pain on right side 10/22/2020  ? Tachycardia 10/22/2020  ? Bilateral leg edema 11/19/2020  ? Sinus drainage 11/20/2020  ? Seasonal allergies 12/25/2020  ? Type 2 diabetes mellitus with hyperglycemia, without long-term current use of insulin ([place]) 03/13/2021  ? Hyperlipidemia LDL goal <70 03/18/2021  ? Attention deficit hyperactivity disorder (ADHD), 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  ? Overweight (BMI 25.0-29.9) 06/18/2021  ? SOB (shortness of breath) on exertion 08/20/2021  ? Ear popping, bilateral 08/20/2021  ? ETD (Eustachian tube dysfunction), bilateral 08/23/2021  ? Right lateral epicondylitis 12/31/2021  ? Chronic right hip pain 01/06/2022  ? Acute pain of right shoulder 01/06/2022  ? Weak urine stream 03/17/2022  ? Proteinuria 03/17/2022  ? ?Resolved Ambulatory Problems  ?  Diagnosis Date Noted  ? FEVER, RECURRENT 04/02/2009  ? Hypothyroidism 02/10/2008  ? SINUSITIS- ACUTE-NOS 03/24/2008  ? URI 01/17/2009  ? ANKLE PAIN, BILATERAL 07/28/2008  ? OPEN WOUND FT NO TOE ALONE WITHOUT MENTION COMP 03/28/2009  ? Bipolar 1 disorder, depressed, severe ([place]) 01/25/2018  ? Chronic right-sided low back pain without sciatica 01/11/2020  ? Hepatomegaly 10/22/2020  ? Chest congestion 11/20/2020  ? ?Past Medical History:  ?Diagnosis Date  ? Arthritis   ? Back pain, chronic   ?  Bipolar 1 disorder ([place])   ? Diabetes mellitus without complication ([place])   ? IUD 2012  ? ? ? ? ? ? ? ? ?Review of Systems ?See HPI.  ?   ?Objective:  ? Physical Exam ?Vitals reviewed.  ?Constitutional:   ?   Appearance: Normal appearance.  ?HENT:  ?   Head: Normocephalic.  ?Cardiovascular:  ?   Rate and Rhythm:  Normal rate and regular rhythm.  ?   Pulses: Normal pulses.  ?   Heart sounds: Normal heart sounds.  ?Pulmonary:  ?   Effort: Pulmonary effort is normal.  ?   Breath sounds: Normal breath sounds.  ?Musculoskeletal:  ?   Right lower leg: No edema.  ?   Left lower leg: No edema.  ?Neurological:  ?   General: No focal deficit present.  ?   Mental Status: Frances [name] is alert and oriented to person, place, and time.  ?Psychiatric:     ?   Mood and Affect: Mood normal.  ? ? ? ? ? ?  .. ?Lab Results  ?Component Value Date  ? HGBA1C 5.3 03/17/2022  ? ?.. ?Results for orders placed or performed in visit on 03/17/22  ?POCT glycosylated hemoglobin (Hb A1C)  ?Result Value Ref Range  ? Hemoglobin A1C 5.3 4.0 - 5.6 %  ? HbA1c POC (<> result, manual entry)    ? HbA1c, POC (prediabetic range)    ? HbA1c, POC (controlled diabetic range)    ?POCT URINALYSIS DIP (CLINITEK)  ?Result Value Ref Range  ? Color, UA straw (A) yellow  ? Clarity, UA cloudy (A) clear  ? Glucose, UA negative negative mg/dL  ? Bilirubin, UA negative negative  ? Ketones, POC UA small (15) (A) negative mg/dL  ? Spec Grav, UA >=1.030 (A) 1.010 - 1.025  ? Blood, UA negative negative  ? pH, UA 7.5 5.0 - 8.0  ? POC PROTEIN,UA trace negative, trace  ? Urobilinogen, UA 1.0 0.2 or 1.0 E.U./dL  ? Nitrite, UA Negative Negative  ? Leukocytes, UA Negative Negative  ? ? ?Assessment & Plan:  ?..Shterna was seen today for follow-up. ? ?Diagnoses and all orders for this visit: ? ?Type 2 diabetes mellitus without complication, without long-term current use of insulin (HCC) ?-     POCT glycosylated hemoglobin (Hb A1C) ?-     POCT URINALYSIS DIP (CLINITEK) ? ?Rheumatoid arthritis 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 (HCC) ?-     HYDROcodone-acetaminophen (NORCO/VICODIN) 5-325 MG tablet; Take 1 tablet by mouth 2 (two) times daily as needed for moderate pain (back pain). ? ?Chronic right-sided low back pain without sciatica ?-     HYDROcodone-acetaminophen (NORCO/VICODIN)  5-325 MG tablet; Take 1 tablet by mouth 2 (two) times daily as needed for moderate pain (back pain). ? ?Chronic right hip pain ?-     HYDROcodone-acetaminophen (NORCO/VICODIN) 5-325 MG tablet; Take 1 tablet by mouth 2 (two) times daily as needed for moderate pain (back pain). ? ?DDD (degenerative disc disease), lumbar ?-     HYDROcodone-acetaminophen (NORCO/VICODIN) 5-325 MG tablet; Take 1 tablet by mouth 2 (two) times daily as needed for moderate pain (back pain). ? ?Proteinuria, unspecified type ?-     Urine Culture ? ?Weak urine stream ? ? ?A1C looks great and to goal ?Continue on same medications ?BP to goal ?On statin ?Foot and eye exam UTD. ?Pneumonia/flu UTD. ?Covid vaccine x3 ?Follow up in 3 months.  ? ? ?..PDMP reviewed during this encounter. ?Last filled 01/22/2022 ?Pain contract  UTD ?No concerns ?Follow up in 3 months via [place] controlled substance laws ? ?UA small ketones and trace protein.  ?Will culture. ?Some hesitancy could be all the anti-histamine usage consider holding xyzal for 2 weeks.  ?Follow up if no improvement or new symptoms ? ?

## 2022-03-17 NOTE — Patient Instructions (Signed)

## 2022-03-17 NOTE — Progress Notes (Signed)
Pt state lower back pain that travels to her right hip and leg. Pt state walking, sitting and standing makes the pain worse. Pt state she takes pain meds and lay in bed to help ease her pain. ? ?Numeric Pain Rating Scale and Functional Assessment ?Average Pain 4 ? ? ?In the last MONTH (on 0-10 scale) has pain interfered with the following? ? ?1. General activity like being  able to carry out your everyday physical activities such as walking, climbing stairs, carrying groceries, or moving a chair?  ?Rating(8) ? ? ?+Driver, -BT, -Dye Allergies. ? ?

## 2022-03-18 MED FILL — CYMBALTA 30 MG CAPSULE,DELAYED RELEASE: ORAL | 30 days supply | Qty: 90 | Fill #1

## 2022-03-18 MED FILL — HYDROXYCHLOROQUINE 200 MG TABLET: ORAL | 30 days supply | Qty: 60 | Fill #4

## 2022-03-18 NOTE — Procedures (Signed)
Lumbar Facet Joint Nerve Denervation ? ?Patient: Frances [name]      ?Date of Birth: 27-Jun-1976 ?MRN: [id number] ?PCP: Donella Stade, PA-C      ?Visit Date: 03/17/2022 ?  ?Universal Protocol:    ?Date/Time: 03/28/238:00 AM ? ?Consent Given By: the patient ? ?Position: PRONE ? ?Additional Comments: ?Vital signs were monitored before and after the procedure. ?Patient was prepped and draped in the usual sterile fashion. ?The correct patient, procedure, and site was verified. ? ? ?Injection Procedure Details:  ? ?Procedure diagnoses:  ?1. Spondylosis without myelopathy or radiculopathy, lumbar region   ?  ? ?Meds Administered:  ?Meds ordered this encounter  ?Medications  ? methylPREDNISolone acetate (DEPO-MEDROL) injection 80 mg  ? baclofen (LIORESAL) 10 MG tablet  ?  Sig: Take 1 tablet (10 mg total) by mouth every 8 (eight) hours as needed for muscle spasms (Pain).  ?  Dispense:  60 tablet  ?  Refill:  0  ?  ? ?Laterality: Right ? ?Location/Site:  L3-L4, L2 and L3 medial branches, L4-L5, L3 and L4 medial branches, and L5-S1, L4 medial branch and L5 dorsal ramus ? ?Needle: 18 ga.,  64m active tip, [id number] Cannula ? ?Needle Placement: Along juncture of superior articular process and transverse pocess ? ?Findings: ? -Comments: ? ?Procedure Details: ?For each desired target nerve, the corresponding transverse process (sacral ala for the L5 dorsal rami) was identified and the fluoroscope was positioned to square off the endplates of the corresponding vertebral body to achieve a true AP midline view.  The beam was then obliqued 15 to 20 degrees and caudally tilted 15 to 20 degrees to line up a trajectory along the target nerves. The skin over the target of the junction of superior articulating process and transverse process (sacral ala for the L5 dorsal rami) was infiltrated with 12m25mf 1% Lidocaine without Epinephrine.  The 18 gauge 63m74mtive tip outer cannula was advanced in trajectory view to the target. ? ?This  procedure was repeated for each target nerve.  Then, for all levels, the outer cannula placement was fine-tuned and the position was then confirmed with bi-planar imaging.   ? ?Test stimulation was done both at sensory and motor levels to ensure there was no radicular stimulation. The target tissues were then infiltrated with 1 ml of 1% Lidocaine without Epinephrine. Subsequently, a percutaneous neurotomy was carried out for 90 seconds at 80 degrees Celsius.  After the completion of the lesion, 1 ml of injectate was delivered. It was then repeated for each facet joint nerve mentioned above. Appropriate radiographs were obtained to verify the probe placement during the neurotomy. ? ? ?Additional Comments:  ?The patient tolerated the procedure well ?Dressing: 2 x 2 sterile gauze and Band-Aid ?  ? ?Post-procedure details: ?Patient was observed during the procedure. ?Post-procedure instructions were reviewed. ? ?Patient left the clinic in stable condition. ? ? ?  ?

## 2022-03-18 NOTE — Procedures (Signed)
Lumbar Facet Joint Nerve Denervation ? ?Patient: Frances [name]      ?Date of Birth: 12-21-1976 ?MRN: [id number] ?PCP: Donella Stade, PA-C      ?Visit Date: 03/10/2022 ?  ?Universal Protocol:    ?Date/Time: 03/28/238:05 AM ? ?Consent Given By: the patient ? ?Position: PRONE ? ?Additional Comments: ?Vital signs were monitored before and after the procedure. ?Patient was prepped and draped in the usual sterile fashion. ?The correct patient, procedure, and site was verified. ? ? ?Injection Procedure Details:  ? ?Procedure diagnoses:  ?1. Spondylosis without myelopathy or radiculopathy, lumbar region   ?  ? ?Meds Administered:  ?Meds ordered this encounter  ?Medications  ? methylPREDNISolone acetate (DEPO-MEDROL) injection 80 mg  ?  ? ?Laterality: Left ? ?Location/Site:  L3-L4, L2 and L3 medial branches, L4-L5, L3 and L4 medial branches, and L5-S1, L4 medial branch and L5 dorsal ramus ? ?Needle: 18 ga.,  33m active tip, [id number] Cannula ? ?Needle Placement: Along juncture of superior articular process and transverse pocess ? ?Findings: ? -Comments: ? ?Procedure Details: ?For each desired target nerve, the corresponding transverse process (sacral ala for the L5 dorsal rami) was identified and the fluoroscope was positioned to square off the endplates of the corresponding vertebral body to achieve a true AP midline view.  The beam was then obliqued 15 to 20 degrees and caudally tilted 15 to 20 degrees to line up a trajectory along the target nerves. The skin over the target of the junction of superior articulating process and transverse process (sacral ala for the L5 dorsal rami) was infiltrated with 68m69mf 1% Lidocaine without Epinephrine.  The 18 gauge 15m62mtive tip outer cannula was advanced in trajectory view to the target. ? ?This procedure was repeated for each target nerve.  Then, for all levels, the outer cannula placement was fine-tuned and the position was then confirmed with bi-planar imaging.    ? ?Test stimulation was done both at sensory and motor levels to ensure there was no radicular stimulation. The target tissues were then infiltrated with 1 ml of 1% Lidocaine without Epinephrine. Subsequently, a percutaneous neurotomy was carried out for 90 seconds at 80 degrees Celsius.  After the completion of the lesion, 1 ml of injectate was delivered. It was then repeated for each facet joint nerve mentioned above. Appropriate radiographs were obtained to verify the probe placement during the neurotomy. ? ? ?Additional Comments:  ?No complications occurred ?Dressing: 2 x 2 sterile gauze and Band-Aid ?  ? ?Post-procedure details: ?Patient was observed during the procedure. ?Post-procedure instructions were reviewed. ? ?Patient left the clinic in stable condition. ? ? ? ?

## 2022-03-18 NOTE — Progress Notes (Signed)
? ?Frances [name] - 46 y.o. female MRN [id number]  Date of birth: [birth year] ? ?Office Visit Note: ?Visit Date: 03/17/2022 ?PCP: Donella Stade, PA-C ?Referred by: Donella Stade, PA-C ? ?Subjective: ?Chief Complaint  ?Patient presents with  ? Lower Back - Pain  ? Right Leg - Pain  ? Right Hip - Pain  ? ?HPI:  Frances [name] is a 46 y.o. female who comes in todayfor planned radiofrequency ablation of the Right L3-4, L4-5, and L5-S1 Lumbar facet joints. This would be ablation of the corresponding medial branches and/or dorsal rami.  Patient has had double diagnostic blocks with more than 50% relief.  These are documented on pain diary.  They have had chronic back pain for quite some time, more than 3 months, which has been an ongoing situation with recalcitrant axial back pain.  They have no radicular pain.  Their axial pain is worse with standing and ambulating and on exam today with facet loading.  They have had physical therapy as well as home exercise program.  The imaging noted in the chart below indicated facet pathology. Accordingly they meet all the criteria and qualification for for radiofrequency ablation and we are going to complete this today hopefully for more longer term relief as part of comprehensive management program.  ? ?*would benefit from pre-procedure valium. Rx given today for trial of baclofen. ? ?ROS Otherwise per HPI. ? ?Assessment & Plan: ?Visit Diagnoses:  ?  ICD-10-CM   ?1. Spondylosis without myelopathy or radiculopathy, lumbar region  M47.816 XR C-ARM NO REPORT  ?  Radiofrequency,Lumbar  ?  methylPREDNISolone acetate (DEPO-MEDROL) injection 80 mg  ?  ?  ?Plan: No additional findings.  ? ?Meds & Orders:  ?Meds ordered this encounter  ?Medications  ? methylPREDNISolone acetate (DEPO-MEDROL) injection 80 mg  ? baclofen (LIORESAL) 10 MG tablet  ?  Sig: Take 1 tablet (10 mg total) by mouth every 8 (eight) hours as needed for muscle spasms (Pain).  ?  Dispense:  60 tablet  ?   Refill:  0  ?  ?Orders Placed This Encounter  ?Procedures  ? Radiofrequency,Lumbar  ? XR C-ARM NO REPORT  ?  ?Follow-up: Return if symptoms worsen or fail to improve.  ? ?Procedures: ?No procedures performed  ?Lumbar Facet Joint Nerve Denervation ? ?Patient: Frances [name]      ?Date of Birth: April 15, 1976 ?MRN: [id number] ?PCP: Donella Stade, PA-C      ?Visit Date: 03/17/2022 ?  ?Universal Protocol:    ?Date/Time: 03/28/238:00 AM ? ?Consent Given By: the patient ? ?Position: PRONE ? ?Additional Comments: ?Vital signs were monitored before and after the procedure. ?Patient was prepped and draped in the usual sterile fashion. ?The correct patient, procedure, and site was verified. ? ? ?Injection Procedure Details:  ? ?Procedure diagnoses:  ?1. Spondylosis without myelopathy or radiculopathy, lumbar region   ?  ? ?Meds Administered:  ?Meds ordered this encounter  ?Medications  ? methylPREDNISolone acetate (DEPO-MEDROL) injection 80 mg  ? baclofen (LIORESAL) 10 MG tablet  ?  Sig: Take 1 tablet (10 mg total) by mouth every 8 (eight) hours as needed for muscle spasms (Pain).  ?  Dispense:  60 tablet  ?  Refill:  0  ?  ? ?Laterality: Right ? ?Location/Site:  L3-L4, L2 and L3 medial branches, L4-L5, L3 and L4 medial branches, and L5-S1, L4 medial branch and L5 dorsal ramus ? ?Needle: 18 ga.,  41m active tip, 1062mRF Cannula ? ?Needle Placement: Along  juncture of superior articular process and transverse pocess ? ?Findings: ? -Comments: ? ?Procedure Details: ?For each desired target nerve, the corresponding transverse process (sacral ala for the L5 dorsal rami) was identified and the fluoroscope was positioned to square off the endplates of the corresponding vertebral body to achieve a true AP midline view.  The beam was then obliqued 15 to 20 degrees and caudally tilted 15 to 20 degrees to line up a trajectory along the target nerves. The skin over the target of the junction of superior articulating process and  transverse process (sacral ala for the L5 dorsal rami) was infiltrated with 59m of 1% Lidocaine without Epinephrine.  The 18 gauge 151mactive tip outer cannula was advanced in trajectory view to the target. ? ?This procedure was repeated for each target nerve.  Then, for all levels, the outer cannula placement was fine-tuned and the position was then confirmed with bi-planar imaging.   ? ?Test stimulation was done both at sensory and motor levels to ensure there was no radicular stimulation. The target tissues were then infiltrated with 1 ml of 1% Lidocaine without Epinephrine. Subsequently, a percutaneous neurotomy was carried out for 90 seconds at 80 degrees Celsius.  After the completion of the lesion, 1 ml of injectate was delivered. It was then repeated for each facet joint nerve mentioned above. Appropriate radiographs were obtained to verify the probe placement during the neurotomy. ? ? ?Additional Comments:  ?The patient tolerated the procedure well ?Dressing: 2 x 2 sterile gauze and Band-Aid ?  ? ?Post-procedure details: ?Patient was observed during the procedure. ?Post-procedure instructions were reviewed. ? ?Patient left the clinic in stable condition. ? ? ?   ? ?Clinical History: ?No specialty comments available.  ? ? ? ?Objective:  VS:  HT:    WT:   BMI:     BP:122/80  HR:83bpm  TEMP: ( )  RESP:  ?Physical Exam ?Vitals and nursing note reviewed.  ?Constitutional:   ?   General: She is not in acute distress. ?   Appearance: Normal appearance. She is not ill-appearing.  ?HENT:  ?   Head: Normocephalic and atraumatic.  ?   Right Ear: External ear normal.  ?   Left Ear: External ear normal.  ?Eyes:  ?   Extraocular Movements: Extraocular movements intact.  ?Cardiovascular:  ?   Rate and Rhythm: Normal rate.  ?   Pulses: Normal pulses.  ?Pulmonary:  ?   Effort: Pulmonary effort is normal. No respiratory distress.  ?Abdominal:  ?   General: There is no distension.  ?   Palpations: Abdomen is soft.   ?Musculoskeletal:     ?   General: Tenderness present.  ?   Cervical back: Neck supple.  ?   Right lower leg: No edema.  ?   Left lower leg: No edema.  ?   Comments: Patient has good distal strength with no pain over the greater trochanters.  No clonus or focal weakness. Patient somewhat slow to rise from a seated position to full extension.  There is concordant low back pain with facet loading and lumbar spine extension rotation.  There are some left lower multifidi trigger points and the patient is somewhat tender across the lower back and PSIS.  There is no pain with hip rotation.   ?Skin: ?   Findings: No erythema, lesion or rash.  ?Neurological:  ?   General: No focal deficit present.  ?   Mental Status: She is alert and oriented  to person, place, and time.  ?   Sensory: No sensory deficit.  ?   Motor: No weakness or abnormal muscle tone.  ?   Coordination: Coordination normal.  ?Psychiatric:     ?   Mood and Affect: Mood normal.     ?   Behavior: Behavior normal.  ?  ? ?Imaging: ?XR C-ARM NO REPORT ? ?Result Date: 03/17/2022 ?Please see Notes tab for imaging impression.  ?

## 2022-03-18 NOTE — Chronic Care Management (AMB) (Signed)
?  Care Management  ? ?Note ? ?03/18/2022 ?Name: Frances [name] MRN: [id number] DOB: [birth year] ? ?Frances [name] is a 46 y.o. year old female who is a primary care patient of Lavada Mesi and is actively engaged with the care management team. I reached out to Para March by phone today to assist with re-scheduling a follow up visit with the Pharmacist ? ?Follow up plan: ?2nd Unsuccessful telephone outreach attempt made. A HIPAA compliant phone message was left for the patient providing contact information and requesting a return call.  ? ?Mercadez Heitman, CCMA ?Care Guide, Embedded Care Coordination ?[place]  [facility] Management  ?Direct Dial: [phone] ? ? ?

## 2022-03-18 NOTE — Progress Notes (Signed)
? ?TENIYA FILTER - 46 y.o. female MRN [id number]  Date of birth: [birth year] ? ?Office Visit Note: ?Visit Date: 03/10/2022 ?PCP: Donella Stade, PA-C ?Referred by: Donella Stade, PA-C ? ?Subjective: ?Chief Complaint  ?Patient presents with  ? Lower Back - Pain  ? ?HPI:  Frances [name] is a 46 y.o. female who comes in todayfor planned radiofrequency ablation of the Left L3-4, L4-5, and L5-S1 Lumbar facet joints. This would be ablation of the corresponding medial branches and/or dorsal rami.  Patient has had double diagnostic blocks with more than 50% relief.  These are documented on pain diary.  They have had chronic back pain for quite some time, more than 3 months, which has been an ongoing situation with recalcitrant axial back pain.  They have no radicular pain.  Their axial pain is worse with standing and ambulating and on exam today with facet loading.  They have had physical therapy as well as home exercise program.  The imaging noted in the chart below indicated facet pathology. Accordingly they meet all the criteria and qualification for for radiofrequency ablation and we are going to complete this today hopefully for more longer term relief as part of comprehensive management program.  ? ?ROS Otherwise per HPI. ? ?Assessment & Plan: ?Visit Diagnoses:  ?  ICD-10-CM   ?1. Spondylosis without myelopathy or radiculopathy, lumbar region  M47.816 XR C-ARM NO REPORT  ?  Radiofrequency,Lumbar  ?  DISCONTINUED: methylPREDNISolone acetate (DEPO-MEDROL) injection 80 mg  ?  ?  ?Plan: No additional findings.  ? ?Meds & Orders:  ?Meds ordered this encounter  ?Medications  ? DISCONTD: methylPREDNISolone acetate (DEPO-MEDROL) injection 80 mg  ?  ?Orders Placed This Encounter  ?Procedures  ? Radiofrequency,Lumbar  ? XR C-ARM NO REPORT  ?  ?Follow-up: Return if symptoms worsen or fail to improve.  ? ?Procedures: ?No procedures performed  ?Lumbar Facet Joint Nerve Denervation ? ?Patient: Frances [name]      ?Date of Birth: [birth year] ?MRN: [id number] ?PCP: Donella Stade, PA-C      ?Visit Date: 03/10/2022 ?  ?Universal Protocol:    ?Date/Time: 03/28/238:05 AM ? ?Consent Given By: the patient ? ?Position: PRONE ? ?Additional Comments: ?Vital signs were monitored before and after the procedure. ?Patient was prepped and draped in the usual sterile fashion. ?The correct patient, procedure, and site was verified. ? ? ?Injection Procedure Details:  ? ?Procedure diagnoses:  ?1. Spondylosis without myelopathy or radiculopathy, lumbar region   ?  ? ?Meds Administered:  ?Meds ordered this encounter  ?Medications  ? methylPREDNISolone acetate (DEPO-MEDROL) injection 80 mg  ?  ? ?Laterality: Left ? ?Location/Site:  L3-L4, L2 and L3 medial branches, L4-L5, L3 and L4 medial branches, and L5-S1, L4 medial branch and L5 dorsal ramus ? ?Needle: 18 ga.,  62m active tip, 1034mRF Cannula ? ?Needle Placement: Along juncture of superior articular process and transverse pocess ? ?Findings: ? -Comments: ? ?Procedure Details: ?For each desired target nerve, the corresponding transverse process (sacral ala for the L5 dorsal rami) was identified and the fluoroscope was positioned to square off the endplates of the corresponding vertebral body to achieve a true AP midline view.  The beam was then obliqued 15 to 20 degrees and caudally tilted 15 to 20 degrees to line up a trajectory along the target nerves. The skin over the target of the junction of superior articulating process and transverse process (sacral ala for the L5 dorsal rami) was infiltrated with 27m57m  of 1% Lidocaine without Epinephrine.  The 18 gauge 43m active tip outer cannula was advanced in trajectory view to the target. ? ?This procedure was repeated for each target nerve.  Then, for all levels, the outer cannula placement was fine-tuned and the position was then confirmed with bi-planar imaging.   ? ?Test stimulation was done both at sensory and motor levels to  ensure there was no radicular stimulation. The target tissues were then infiltrated with 1 ml of 1% Lidocaine without Epinephrine. Subsequently, a percutaneous neurotomy was carried out for 90 seconds at 80 degrees Celsius.  After the completion of the lesion, 1 ml of injectate was delivered. It was then repeated for each facet joint nerve mentioned above. Appropriate radiographs were obtained to verify the probe placement during the neurotomy. ? ? ?Additional Comments:  ?No complications occurred ?Dressing: 2 x 2 sterile gauze and Band-Aid ?  ? ?Post-procedure details: ?Patient was observed during the procedure. ?Post-procedure instructions were reviewed. ? ?Patient left the clinic in stable condition. ? ? ?  ? ?Clinical History: ?No specialty comments available.  ? ? ? ?Objective:  VS:  HT:    WT:   BMI:     BP:116/81  HR:99bpm  TEMP:98.3 ?F (36.8 ?C)( )  RESP:  ?Physical Exam ?Vitals and nursing note reviewed.  ?Constitutional:   ?   General: She is not in acute distress. ?   Appearance: Normal appearance. She is not ill-appearing.  ?HENT:  ?   Head: Normocephalic and atraumatic.  ?   Right Ear: External ear normal.  ?   Left Ear: External ear normal.  ?Eyes:  ?   Extraocular Movements: Extraocular movements intact.  ?Cardiovascular:  ?   Rate and Rhythm: Normal rate.  ?   Pulses: Normal pulses.  ?Pulmonary:  ?   Effort: Pulmonary effort is normal. No respiratory distress.  ?Abdominal:  ?   General: There is no distension.  ?   Palpations: Abdomen is soft.  ?Musculoskeletal:     ?   General: Tenderness present.  ?   Cervical back: Neck supple.  ?   Right lower leg: No edema.  ?   Left lower leg: No edema.  ?   Comments: Patient has good distal strength with no pain over the greater trochanters.  No clonus or focal weakness. Patient somewhat slow to rise from a seated position to full extension.  There is concordant low back pain with facet loading and lumbar spine extension rotation.  There are no  definitive trigger points but the patient is somewhat tender across the lower back and PSIS.  There is no pain with hip rotation.   ?Skin: ?   Findings: No erythema, lesion or rash.  ?Neurological:  ?   General: No focal deficit present.  ?   Mental Status: She is alert and oriented to person, place, and time.  ?   Sensory: No sensory deficit.  ?   Motor: No weakness or abnormal muscle tone.  ?   Coordination: Coordination normal.  ?Psychiatric:     ?   Mood and Affect: Mood normal.     ?   Behavior: Behavior normal.  ?  ? ?Imaging: ?XR C-ARM NO REPORT ? ?Result Date: 03/17/2022 ?Please see Notes tab for imaging impression.  ?

## 2022-03-19 LAB — URINE CULTURE
MICRO NUMBER:: 13187624
SPECIMEN QUALITY:: ADEQUATE

## 2022-03-19 NOTE — Progress Notes (Signed)
No significant bacteria detected.

## 2022-03-21 ENCOUNTER — Ambulatory Visit: Admit: 2022-03-21 | Discharge: 2022-03-22

## 2022-03-24 ENCOUNTER — Other Ambulatory Visit (HOSPITAL_COMMUNITY): Payer: Self-pay | Admitting: Psychiatry

## 2022-03-24 ENCOUNTER — Telehealth (HOSPITAL_COMMUNITY): Payer: Self-pay | Admitting: *Deleted

## 2022-03-24 DIAGNOSIS — F319 Bipolar disorder, unspecified: Secondary | ICD-10-CM

## 2022-03-24 MED ORDER — BENZTROPINE MESYLATE 0.5 MG PO TABS: 0.5000 mg | ORAL_TABLET | Freq: Every day | ORAL | 3 refills | Status: DC

## 2022-03-24 MED ORDER — ARIPIPRAZOLE 15 MG PO TABS: 15.0000 mg | ORAL_TABLET | Freq: Every day | ORAL | 3 refills | Status: DC

## 2022-03-24 NOTE — Telephone Encounter (Signed)
PATIENT CALLED LVM & I CALLED & SPOKE WITH PATIENT  TO VERIFY-- STATES THAT SHE NEEDS HER REFILLS SENT TO [facility]  & LOOKS LIKE THEY WERE SENT TO [facility] ? ?* REFILLS NEEDED  ? ?ARIPiprazole (ABILIFY) 15 MG tablet ?Take 1 tablet (15 mg total) by mouth daily  ? ?benztropine (COGENTIN) 0.5 MG tablet ?Take 1 tablet (0.5 mg total) by mouth daily  ? ? ? ?

## 2022-03-24 NOTE — Telephone Encounter (Signed)
Refill for abilify and cogentin done, 30 day with 2 refills ?

## 2022-03-24 NOTE — Chronic Care Management (AMB) (Signed)
?  Care Management  ? ?Note ? ?03/24/2022 ?Name: Frances [name] MRN: [id number] DOB: [birth year] ? ?Frances [name] is a 46 y.o. year old female who is a primary care patient of Lavada Mesi and is actively engaged with the care management team. I reached out to Para March by phone today to assist with re-scheduling a follow up visit with the Pharmacist ? ?Follow up plan: ?Face to Face appointment with care management team member scheduled for:  06/18/2022 ? ?Frances [name], CCMA ?Care Guide, Embedded Care Coordination ?[place]  Care Management  ?Direct Dial: [phone] ? ? ?

## 2022-03-27 ENCOUNTER — Other Ambulatory Visit (HOSPITAL_COMMUNITY): Payer: Self-pay | Admitting: Psychiatry

## 2022-03-27 ENCOUNTER — Telehealth (INDEPENDENT_AMBULATORY_CARE_PROVIDER_SITE_OTHER): Payer: No Payment, Other | Admitting: Psychiatry

## 2022-03-27 DIAGNOSIS — F900 Attention-deficit hyperactivity disorder, predominantly inattentive type: Secondary | ICD-10-CM | POA: Diagnosis not present

## 2022-03-27 DIAGNOSIS — F319 Bipolar disorder, unspecified: Secondary | ICD-10-CM | POA: Diagnosis not present

## 2022-03-27 MED ORDER — BUPROPION HCL ER (XL) 300 MG PO TB24: 300.0000 mg | ORAL_TABLET | Freq: Every day | ORAL | 2 refills | Status: DC

## 2022-03-27 NOTE — Progress Notes (Signed)
BH MD/PA/NP OP Progress Note ? ?03/27/2022 1:32 PM ?Frances [name]  ?MRN:  [id number] ? ? ?Virtual Visit via Telephone Note ? ?I connected with Frances [name] on 03/27/22 at 11:30 AM EDT by telephone and verified that I am speaking with the correct person using two identifiers. ? ?Location: ?Patient: Home ?Provider: Offsite ?  ?I discussed the limitations, risks, security and privacy concerns of performing an evaluation and management service by telephone and the availability of in person appointments. I also discussed with the patient that there may be a patient responsible charge related to this service. The patient expressed understanding and agreed to proceed. ? ?  ?I discussed the assessment and treatment plan with the patient. The patient was provided an opportunity to ask questions and all were answered. The patient agreed with the plan and demonstrated an understanding of the instructions. ?  ?The patient was advised to call back or seek an in-person evaluation if the symptoms worsen or if the condition fails to improve as anticipated. ? ?I provided 10 minutes of non-face-to-face time during this encounter. ? ? ?[name], NP  ? ?Chief Complaint: Medication management ? ?HPI: Frances [name] is a 46 year old female presenting to [facility] behavioral health outpatient for psychiatric follow-up evaluation.  Patient has a psychiatric history of bipolar disorder and ADHD.  Her symptoms are managed with Abilify 15 mg daily, Cogentin 0.5 mg daily, Wellbutrin 450 mg daily.  Patient reports that her medications are effective with managing her symptoms and denies the need for dosage adjustment today.  Patient denies adverse medication effects.  No medication changes today. ? ?Visit Diagnosis:  ?  ICD-10-CM   ?1. Bipolar I disorder (HCC)  F31.9 buPROPion (WELLBUTRIN XL) 300 MG 24 hr tablet  ?  ?2. Attention deficit hyperactivity disorder (ADHD), predominantly inattentive type  F90.0 buPROPion  (WELLBUTRIN XL) 300 MG 24 hr tablet  ?  ? ? ?Past Psychiatric History: See below ? ?Past Medical History:  ?Past Medical History:  ?Diagnosis Date  ? Anxiety   ? Arthritis   ? Back pain, chronic   ? Bipolar 1 disorder ([place])   ? Chronic fatigue syndrome   ? DDD (degenerative disc disease), lumbar   ? Depression   ? Diabetes mellitus without complication ([place])   ? Fibromyalgia   ? IUD 2012  ? Mirena  ? MTHFR gene mutation   ?  ?Past Surgical History:  ?Procedure Laterality Date  ? COLONOSCOPY WITH PROPOFOL N/A 02/25/2017  ? Procedure: COLONOSCOPY WITH PROPOFOL;  Surgeon: Arta Silence, MD;  Location: [facility];  Service: Endoscopy;  Laterality: N/A;  ? left knee lateral release  1993  ? scar tisue removal  03/2005  ? left ankle  ? TONSILLECTOMY  age 57's  ? ? ?Family Psychiatric History: See below ? ?Family History:  ?Family History  ?Problem Relation Age of Onset  ? Diabetes Mother   ? Stroke Mother   ? Lupus Mother   ? Hypertension Mother   ? Congestive Heart Failure Mother   ? Mental illness Brother   ?     Not clear what his diagnosis is--may be related to previous drug use.  ? Cancer Maternal Grandmother   ?     colon  ? Depression Maternal Uncle   ? Alcohol abuse Maternal Uncle   ? Diabetes Maternal Grandfather   ? ? ?Social History:  ?Social History  ? ?Socioeconomic History  ? Marital status: Single  ?  Spouse name: Not on file  ?  Number of children: 0  ? Years of education: Not on file  ? Highest education level: Bachelor's degree (e.g., BA, AB, BS)  ?Occupational History  ? Occupation: Designer, industrial/product at times  ?Tobacco Use  ? Smoking status: Never  ? Smokeless tobacco: Never  ?Vaping Use  ? Vaping Use: Never used  ?Substance and Sexual Activity  ? Alcohol use: No  ?  Alcohol/week: 0.0 standard drinks  ? Drug use: No  ? Sexual activity: Yes  ?  Birth control/protection: I.U.D.  ?Other Topics Concern  ? Not on file  ?Social History Narrative  ? Originally from [place], outside of [place].   ? Moved here  permanently 2012.  ? Intermittently worked on a farm.  ? Lives with many cats--3 plus fosters cats.   ? Single, lives alone in a one story home. Rarely drinks caffeine. 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  ? ?Social Determinants of Health  ? ?Financial Resource Strain: Not on file  ?Food Insecurity: Not on file  ?Transportation Needs: Not on file  ?Physical Activity: Not on file  ?Stress: Not on file  ?Social Connections: Not on file  ? ? ?Allergies:  ?Allergies  ?Allergen Reactions  ? Metaxalone Hives  ? ? ?Metabolic Disorder Labs: ?Lab Results  ?Component Value Date  ? HGBA1C 5.3 03/17/2022  ? MPG 103 04/14/2018  ? ?No results found for: PROLACTIN ?Lab Results  ?Component Value Date  ? CHOL 157 01/20/2022  ? TRIG 124 01/20/2022  ? HDL 46 01/20/2022  ? CHOLHDL 3.4 01/20/2022  ? [place] 89 01/20/2022  ? [place] 73 04/14/2018  ? ?Lab Results  ?Component Value Date  ? TSH 0.866 11/21/2020  ? TSH 1.616 01/27/2018  ? ? ?Therapeutic Level Labs: ?No results found for: LITHIUM ?No results found for: VALPROATE ?No components found for:  CBMZ ? ?Current Medications: ?Current Outpatient Medications  ?Medication Sig Dispense Refill  ? buPROPion (WELLBUTRIN XL) 150 MG 24 hr tablet TAKE 1 TABLET BY MOUTH EVERY MORNING 30 tablet 3  ? albuterol (VENTOLIN HFA) 108 (90 Base) MCG/ACT inhaler Take 2 puffs 15 minutes before exercise and as needed for shortness of breath. 6.7 g 1  ? ARIPiprazole (ABILIFY) 15 MG tablet Take 1 tablet (15 mg total) by mouth daily. 30 tablet 3  ? atorvastatin (LIPITOR) 20 MG tablet Take 1 tablet (20 mg total) by mouth daily. 90 tablet 3  ? Azelastine HCl 137 MCG/SPRAY SOLN USE 2 SPRAYS INTO ALTERNATE NOSTRILS TWICE A DAY 30 mL 2  ? baclofen (LIORESAL) 10 MG tablet Take 1 tablet (10 mg total) by mouth every 8 (eight) hours as needed for muscle spasms (Pain). 60 tablet 0  ? benztropine (COGENTIN) 0.5 MG tablet Take 1 tablet (0.5 mg total) by mouth daily. 30 tablet 3  ? blood  glucose meter kit and supplies KIT Dispense based on patient and insurance preference. Use up to four times daily as directed. 1 each 0  ? buPROPion (WELLBUTRIN XL) 300 MG 24 hr tablet Take 1 tablet (300 mg total) by mouth daily. 30 tablet 2  ? cholecalciferol (VITAMIN D3) 25 MCG (1000 UT) tablet Take 1,000 Units by mouth daily.    ? DULoxetine HCl 30 MG CSDR Take 90 mg by mouth daily.    ? famotidine (PEPCID) 20 MG tablet Take 20 mg by mouth daily.    ? famotidine (PEPCID) 40 MG tablet Take by mouth.    ? fluticasone (FLONASE) 50 MCG/ACT nasal spray USE 2  SPRAYS IN EACH NOSTRIL EVERY DAY 16 g 1  ? HYDROcodone-acetaminophen (NORCO/VICODIN) 5-325 MG tablet Take 1 tablet by mouth 2 (two) times daily as needed for moderate pain (back pain). 60 tablet 0  ? hydroxychloroquine (PLAQUENIL) 200 MG tablet Take by mouth.    ? levocetirizine (XYZAL) 5 MG tablet TAKE 1 TABLET BY MOUTH EVERY EVENING 90 tablet 1  ? metFORMIN (GLUCOPHAGE) 1000 MG tablet TAKE 1 Tablet  BY MOUTH TWICE DAILY WITH A MEAL 180 tablet 1  ? methocarbamol (ROBAXIN) 500 MG tablet TAKE 1 TABLET BY MOUTH THREE TIMES A DAY 90 tablet 1  ? montelukast (SINGULAIR) 10 MG tablet TAKE 1 Tablet BY MOUTH ONCE EVERY NIGHT AT BEDTIME 90 tablet 1  ? pregabalin (LYRICA) 75 MG capsule TAKE 1 CAPSULE BY MOUTH TWICE A DAY 60 capsule 3  ? Semaglutide,0.25 or 0.5MG/DOS, (OZEMPIC, 0.25 OR 0.5 MG/DOSE,) 2 MG/1.5ML SOPN Inject 0.25 mg into the skin once a week. (Patient taking differently: Inject 0.5 mg into the skin once a week.) 1.5 mL 0  ? ?No current facility-administered medications for this visit.  ? ? ? ?Musculoskeletal: ?Strength & Muscle Tone: N/A virtual visit ?Gait & Station: N/A virtual visit ?Patient leans: N/A ? ?Psychiatric Specialty Exam: ?Review of Systems  ?Psychiatric/Behavioral:  Negative for hallucinations, self-injury and suicidal ideas.   ?All other systems reviewed and are negative.  ?There were no vitals taken for this visit.There is no height or weight  on file to calculate BMI.  ?General Appearance: N/A  ?Eye Contact: N/A  ?Speech: Clear and coherent  ?Volume: Normal  ?Mood: Euthymic  ?Affect: N/A  ?Thought Process: Goal directed  ?Orientation:  Full (Time, Place, and

## 2022-04-06 ENCOUNTER — Emergency Department (INDEPENDENT_AMBULATORY_CARE_PROVIDER_SITE_OTHER)
Admission: EM | Admit: 2022-04-06 | Discharge: 2022-04-06 | Disposition: A | Discharge status: Home / Self Care | Payer: Self-pay | Source: Home / Self Care

## 2022-04-06 ENCOUNTER — Encounter: Payer: Self-pay | Admitting: Emergency Medicine

## 2022-04-06 DIAGNOSIS — G8929 Other chronic pain: Secondary | ICD-10-CM

## 2022-04-06 DIAGNOSIS — M545 Low back pain, unspecified: Secondary | ICD-10-CM

## 2022-04-06 MED ORDER — KETOROLAC TROMETHAMINE 60 MG/2ML IM SOLN
60.0000 mg | Freq: Once | INTRAMUSCULAR | Status: AC
  Administered 2022-04-06: 60 mg via INTRAMUSCULAR

## 2022-04-06 MED ORDER — METHYLPREDNISOLONE ACETATE 80 MG/ML IJ SUSP
80.0000 mg | Freq: Once | INTRAMUSCULAR | Status: AC
  Administered 2022-04-06: 80 mg via INTRAMUSCULAR

## 2022-04-06 NOTE — ED Triage Notes (Signed)
Patient presents to Urgent Care with complaints of lower left back pain since 1 day ago. Patient reports she has Vicodin for pain and is not touching the pain. Yesterday unable to walk. Today is better. Wanted a shot to help. Denies any radiation down the leg.   ?

## 2022-04-06 NOTE — ED Provider Notes (Signed)
?[place] ? ? ? ?CSN: [id number] ?Arrival date & time: 04/06/22  1102 ? ? ?  ? ?History   ?Chief Complaint ?Chief Complaint  ?Patient presents with  ? Back Pain  ? ? ?HPI ?Frances [name] is a 46 y.o. female.  ? ?HPI ? ?46 year old Rehman with extensive history of back problems including spinal stenosis, disc disease, antero and retrolisthesis who has had back specialty care for years.  Currently the left side of her low back is severely painful since yesterday.  Yesterday she states she could hardly move.  She states she has the medicine she needs to treat this at home including Vicodin, muscle relaxers, pregabalin.  In spite of this she is still in a lot of pain and is here requesting an injection.  She has had good results with Toradol in the past.  She states that she also sometimes gets steroids that will help her.  She does not have any numbness or weakness into the legs, no problems with bowel or bladder ? ?Past Medical History:  ?Diagnosis Date  ? Anxiety   ? Arthritis   ? Back pain, chronic   ? Bipolar 1 disorder ([place])   ? Chronic fatigue syndrome   ? DDD (degenerative disc disease), lumbar   ? Depression   ? Diabetes mellitus without complication ([place])   ? Fibromyalgia   ? IUD 2012  ? Mirena  ? MTHFR gene mutation   ? ? ?Patient Active Problem List  ? Diagnosis Date Noted  ? Weak urine stream 03/17/2022  ? Proteinuria 03/17/2022  ? Chronic right hip pain 01/06/2022  ? Acute pain of right shoulder 01/06/2022  ? Right lateral epicondylitis 12/31/2021  ? ETD (Eustachian tube dysfunction), bilateral 08/23/2021  ? SOB (shortness of breath) on exertion 08/20/2021  ? Overweight (BMI 25.0-29.9) 06/18/2021  ? Attention deficit hyperactivity disorder (ADHD), 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  ? Hyperlipidemia LDL goal <70 03/18/2021  ? Type 2 diabetes mellitus with hyperglycemia, without long-term current use of insulin ([place]) 03/13/2021  ? Seasonal allergies 12/25/2020  ? Bilateral leg edema  11/19/2020  ? Costochondritis 10/22/2020  ? Rib pain on right side 10/22/2020  ? Tachycardia 10/22/2020  ? Symptomatic mammary hypertrophy 09/04/2020  ? Xiphoid pain 08/13/2020  ? Sternum pain 08/13/2020  ? Recurrent major depressive disorder, in partial remission ([place]) 05/24/2020  ? Generalized anxiety disorder 05/24/2020  ? DDD (degenerative disc disease), cervical 03/13/2020  ? Ingrowing left great toenail 03/13/2020  ? Labral tear of hip, degenerative, right 01/11/2020  ? Grief reaction 11/28/2019  ? Toenail fungus 09/09/2019  ? Vasovagal response 09/09/2019  ? Decreased visual acuity 08/30/2018  ? Right corneal abrasion 08/30/2018  ? Elevated fasting glucose 04/08/2018  ? Anxiety 01/22/2018  ? Borderline personality disorder ([place]) 01/22/2018  ? Rheumatoid arthritis 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 ([place]) 12/09/2017  ? TMJ (temporomandibular joint syndrome) 09/26/2017  ? New daily persistent headache 09/26/2017  ? MTHFR gene mutation 09/20/2017  ? Bipolar I disorder ([place]) 09/16/2017  ? Chronic fatigue syndrome 09/16/2017  ? Back pain 10/13/2008  ? DERMATITIS, ALLERGIC 06/05/2008  ? 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 DDD (degenerative disc disease), lumbar 02/10/2008  ? FIBROMYALGIA 02/10/2008  ? IRON, SERUM, ELEVATED 02/10/2008  ? ? ?Past Surgical History:  ?Procedure Laterality Date  ? COLONOSCOPY WITH PROPOFOL N/A 02/25/2017  ? Procedure: COLONOSCOPY WITH PROPOFOL;  Surgeon: Arta Silence, MD;  Location: [facility];  Service: Endoscopy;  Laterality: N/A;  ? left knee lateral  release  1993  ? scar tisue removal  03/2005  ? left ankle  ? TONSILLECTOMY  age 37's  ? ? ?OB History   ? ? Gravida  ?0  ? Para  ?0  ? Term  ?0  ? Preterm  ?0  ? AB  ?0  ? Living  ?0  ?  ? ? SAB  ?0  ? IAB  ?0  ? Ectopic  ?0  ? Multiple  ?0  ? Live Births  ?   ?   ?  ?  ? ? ? ?Home Medications   ? ?Prior to Admission medications   ?Medication Sig Start Date End Date Taking? Authorizing Provider  ?albuterol (VENTOLIN  HFA) 108 (90 Base) MCG/ACT inhaler Take 2 puffs 15 minutes before exercise and as needed for shortness of breath. 08/27/21  Yes Breeback, Jade L, PA-C  ?ARIPiprazole (ABILIFY) 15 MG tablet Take 1 tablet (15 mg total) by mouth daily. 03/24/22  Yes Hampton Abbot, MD  ?atorvastatin (LIPITOR) 20 MG tablet Take 1 tablet (20 mg total) by mouth daily. 01/21/22  Yes Breeback, Jade L, PA-C  ?Azelastine HCl 137 MCG/SPRAY SOLN USE 2 SPRAYS INTO ALTERNATE NOSTRILS TWICE A DAY 02/19/22  Yes Breeback, Jade L, PA-C  ?baclofen (LIORESAL) 10 MG tablet Take 1 tablet (10 mg total) by mouth every 8 (eight) hours as needed for muscle spasms (Pain). 03/17/22  Yes Magnus Sinning, MD  ?benztropine (COGENTIN) 0.5 MG tablet Take 1 tablet (0.5 mg total) by mouth daily. 03/24/22  Yes Hampton Abbot, MD  ?buPROPion (WELLBUTRIN XL) 150 MG 24 hr tablet TAKE 1 TABLET BY MOUTH EVERY MORNING 03/27/22  Yes Penn, Lunette Stands, NP  ?buPROPion (WELLBUTRIN XL) 300 MG 24 hr tablet Take 1 tablet (300 mg total) by mouth daily. 03/27/22  Yes Penn, Cicely, NP  ?cholecalciferol (VITAMIN D3) 25 MCG (1000 UT) tablet Take 1,000 Units by mouth daily.   Yes [provider]  ?DULoxetine HCl 30 MG CSDR Take 90 mg by mouth daily.   Yes [provider]  ?fluticasone (FLONASE) 50 MCG/ACT nasal spray USE 2 SPRAYS IN EACH NOSTRIL EVERY DAY 03/14/22  Yes Breeback, Jade L, PA-C  ?HYDROcodone-acetaminophen (NORCO/VICODIN) 5-325 MG tablet Take 1 tablet by mouth 2 (two) times daily as needed for moderate pain (back pain). 03/17/22  Yes Breeback, Jade L, PA-C  ?hydroxychloroquine (PLAQUENIL) 200 MG tablet Take by mouth. 11/21/21 11/21/22 Yes [provider]  ?metFORMIN (GLUCOPHAGE) 1000 MG tablet TAKE 1 Tablet  BY MOUTH TWICE DAILY WITH A MEAL 12/04/21  Yes Breeback, Jade L, PA-C  ?methocarbamol (ROBAXIN) 500 MG tablet TAKE 1 TABLET BY MOUTH THREE TIMES A DAY 03/17/22  Yes Breeback, Jade L, PA-C  ?montelukast (SINGULAIR) 10 MG tablet TAKE 1 Tablet BY MOUTH ONCE EVERY NIGHT  AT BEDTIME 12/04/21  Yes Breeback, Jade L, PA-C  ?pregabalin (LYRICA) 75 MG capsule TAKE 1 CAPSULE BY MOUTH TWICE A DAY 12/19/21  Yes Silverio Decamp, MD  ?Semaglutide,0.25 or 0.5MG /DOS, (OZEMPIC, 0.25 OR 0.5 MG/DOSE,) 2 MG/1.5ML SOPN Inject 0.25 mg into the skin once a week. ?Patient taking differently: Inject 0.5 mg into the skin once a week. 05/29/21  Yes Breeback, Jade L, PA-C  ?blood glucose meter kit and supplies KIT Dispense based on patient and insurance preference. Use up to four times daily as directed. 05/29/21   Breeback, Royetta Car, PA-C  ?levocetirizine (XYZAL) 5 MG tablet TAKE 1 TABLET BY MOUTH EVERY EVENING 01/07/22   Iran Planas L, PA-C  ? ? ?  Family History ?Family History  ?Problem Relation Age of Onset  ? Diabetes Mother   ? Stroke Mother   ? Lupus Mother   ? Hypertension Mother   ? Congestive Heart Failure Mother   ? Mental illness Brother   ?     Not clear what his diagnosis is--may be related to previous drug use.  ? Cancer Maternal Grandmother   ?     colon  ? Depression Maternal Uncle   ? Alcohol abuse Maternal Uncle   ? Diabetes Maternal Grandfather   ? ? ?Social History ?Social History  ? ?Tobacco Use  ? Smoking status: Never  ? Smokeless tobacco: Never  ?Vaping Use  ? Vaping Use: Never used  ?Substance Use Topics  ? Alcohol use: No  ?  Alcohol/week: 0.0 standard drinks  ? Drug use: No  ? ? ? ?Allergies   ?Metaxalone ? ? ?Review of Systems ?Review of Systems ?See HPI ? ?Physical Exam ?Triage Vital Signs ?ED Triage Vitals  ?Enc Vitals Group  ?   BP 04/06/22 1125 111/76  ?   Pulse Rate 04/06/22 1125 87  ?   Resp 04/06/22 1125 16  ?   Temp 04/06/22 1125 98.9 ?F (37.2 ?C)  ?   Temp Source 04/06/22 1125 Oral  ?   SpO2 04/06/22 1125 97 %  ?   Weight --   ?   Height --   ?   Head Circumference --   ?   Peak Flow --   ?   Pain Score 04/06/22 1121 5  ?   Pain Loc --   ?   Pain Edu? --   ?   Excl. in [place]? --   ? ?No data found. ? ?Updated Vital Signs ?BP 111/76 (BP Location: Left Arm)   Pulse 87    Temp 98.9 ?F (37.2 ?C) (Oral)   Resp 16   SpO2 97%  ? ?    ? ?Physical Exam ?Constitutional:   ?   General: She is not in acute distress. ?   Appearance: She is well-developed and normal weight. She is

## 2022-04-06 NOTE — Discharge Instructions (Signed)
Activity as tolerated ?Use ice or heat to painful back area ?Continue your usual medications ?You have been given injections of Toradol for pain and a steroid for the inflammation ?Call your usual back provider tomorrow ?

## 2022-04-07 ENCOUNTER — Ambulatory Visit (INDEPENDENT_AMBULATORY_CARE_PROVIDER_SITE_OTHER): Payer: Self-pay | Admitting: Physician Assistant

## 2022-04-07 VITALS — BP 124/81 | HR 72 | Ht 67.0 in | Wt 162.0 lb

## 2022-04-07 DIAGNOSIS — G8929 Other chronic pain: Secondary | ICD-10-CM

## 2022-04-07 DIAGNOSIS — M51369 Other intervertebral disc degeneration, lumbar region without mention of lumbar back pain or lower extremity pain: Secondary | ICD-10-CM | POA: Insufficient documentation

## 2022-04-07 DIAGNOSIS — M545 Low back pain, unspecified: Secondary | ICD-10-CM

## 2022-04-07 DIAGNOSIS — M51379 Other intervertebral disc degeneration, lumbosacral region without mention of lumbar back pain or lower extremity pain: Secondary | ICD-10-CM

## 2022-04-07 DIAGNOSIS — M5136 Other intervertebral disc degeneration, lumbar region: Secondary | ICD-10-CM | POA: Insufficient documentation

## 2022-04-07 DIAGNOSIS — M5137 Other intervertebral disc degeneration, lumbosacral region: Secondary | ICD-10-CM

## 2022-04-07 MED ORDER — PREDNISONE 50 MG PO TABS: ORAL_TABLET | ORAL | 0 refills | Status: DC

## 2022-04-07 NOTE — Patient Instructions (Signed)
PT for dry needling ?Start prednisone ?Consider heat and tens unit ?Icy patches when tens not on ?Continue with muscle relaxer and Vicodin ?

## 2022-04-07 NOTE — Progress Notes (Signed)
? ?Subjective:  ? ? Patient ID: Frances [name], female    DOB: 1976-06-30, 46 y.o.   MRN: [id number] ? ?HPI ?Pt is a 46 yo female with long history of cervical and lumbar back pain with known DDD and sponyloysis who presents to the clinic with severe and sudden mid to lower left back pain that started 4/15. She is not aware of any injury. She denies any recent heavy lifting, bending or twisting. She did go to UC on 04/06/2022. She was given shot of toradol and solumedrol with little relief. She is using her vicodin, muscle relaxer, lyrica with little relief. Rates pain 4/10 most of the time right now but has burst of sharp unbearable pain of 8/10. Pt denies any radiation of pain or weakness of lower extremity. No bowel or bladder dysfunction.  ? ?.. ?Active Ambulatory Problems  ?  Diagnosis Date Noted  ? 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 DERMATITIS, ALLERGIC 06/05/2008  ? DDD (degenerative disc disease), lumbar 02/10/2008  ? Back pain 10/13/2008  ? FIBROMYALGIA 02/10/2008  ? IRON, SERUM, ELEVATED 02/10/2008  ? Bipolar I disorder ([place]) 09/16/2017  ? Chronic fatigue syndrome 09/16/2017  ? MTHFR gene mutation 09/20/2017  ? TMJ (temporomandibular joint syndrome) 09/26/2017  ? New daily persistent headache 09/26/2017  ? Rheumatoid arthritis 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 ([place]) 12/09/2017  ? Anxiety 01/22/2018  ? Borderline personality disorder ([place]) 01/22/2018  ? Elevated fasting glucose 04/08/2018  ? Decreased visual acuity 08/30/2018  ? Right corneal abrasion 08/30/2018  ? Toenail fungus 09/09/2019  ? Vasovagal response 09/09/2019  ? Grief reaction 11/28/2019  ? Labral tear of hip, degenerative, right 01/11/2020  ? DDD (degenerative disc disease), cervical 03/13/2020  ? Ingrowing left great toenail 03/13/2020  ? Recurrent major depressive disorder, in partial remission ([place]) 05/24/2020  ? Generalized anxiety disorder 05/24/2020  ? Xiphoid pain 08/13/2020  ? Sternum pain 08/13/2020  ?  Symptomatic mammary hypertrophy 09/04/2020  ? Costochondritis 10/22/2020  ? Rib pain on right side 10/22/2020  ? Tachycardia 10/22/2020  ? Bilateral leg edema 11/19/2020  ? Seasonal allergies 12/25/2020  ? Type 2 diabetes mellitus with hyperglycemia, without long-term current use of insulin ([place]) 03/13/2021  ? Hyperlipidemia LDL goal <70 03/18/2021  ? Attention deficit hyperactivity disorder (ADHD), 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  ? Overweight (BMI 25.0-29.9) 06/18/2021  ? SOB (shortness of breath) on exertion 08/20/2021  ? ETD (Eustachian tube dysfunction), bilateral 08/23/2021  ? Right lateral epicondylitis 12/31/2021  ? Chronic right hip pain 01/06/2022  ? Acute pain of right shoulder 01/06/2022  ? Weak urine stream 03/17/2022  ? Proteinuria 03/17/2022  ? Acute left-sided low back pain without sciatica 04/07/2022  ? DEGENERATIVE DISC DISEASE, LUMBAR SPINE 04/07/2022  ? Acute midline low back pain without sciatica 04/07/2022  ? ?Resolved Ambulatory Problems  ?  Diagnosis Date Noted  ? FEVER, RECURRENT 04/02/2009  ? Hypothyroidism 02/10/2008  ? SINUSITIS- ACUTE-NOS 03/24/2008  ? URI 01/17/2009  ? ANKLE PAIN, BILATERAL 07/28/2008  ? FATIGUE 05/05/2008  ? OPEN WOUND FT NO TOE ALONE WITHOUT MENTION COMP 03/28/2009  ? DYSPNEA 10/15/2011  ? Fibromyalgia 09/16/2017  ? Depression   ? Bipolar 1 disorder, depressed, severe ([place]) 01/25/2018  ? Chronic right-sided low back pain without sciatica 01/11/2020  ? Macromastia 06/29/2020  ? Class 1 obesity due to excess calories without serious comorbidity with body mass index (BMI) of 32.0 to 32.9 in adult 08/13/2020  ? Hepatomegaly 10/22/2020  ? Chest congestion 11/20/2020  ? Sinus  drainage 11/20/2020  ? Ear popping, bilateral 08/20/2021  ? ?Past Medical History:  ?Diagnosis Date  ? Arthritis   ? Back pain, chronic   ? Bipolar 1 disorder ([place])   ? Diabetes mellitus without complication ([place])   ? IUD 2012  ? ? ? ?Review of Systems ?See HPI.  ?   ?Objective:  ? Physical  Exam ?Vitals reviewed.  ?Constitutional:   ?   Appearance: Normal appearance.  ?HENT:  ?   Head: Normocephalic.  ?Cardiovascular:  ?   Rate and Rhythm: Normal rate and regular rhythm.  ?Pulmonary:  ?   Effort: Pulmonary effort is normal.  ?   Breath sounds: Normal breath sounds.  ?Musculoskeletal:  ?   Right lower leg: No edema.  ?   Left lower leg: No edema.  ?   Comments: Decreased ROM at waist due to pain ?Negative SLR, bilaterally ?5/5 strength of lower extremity ?Tightness and tenderness of left sided paraspinal muscles from T11/L1 down  ?Neurological:  ?   General: No focal deficit present.  ?   Mental Status: She is alert and oriented to person, place, and time.  ?Psychiatric:     ?   Mood and Affect: Mood normal.  ? ? ? ? ? ?   ?Assessment & Plan:  ?..Diagnoses and all orders for this visit: ? ?Acute left-sided low back pain without sciatica ?-     predniSONE (DELTASONE) 50 MG tablet; Take one tablet for 5 days. ?-     Ambulatory referral to Physical Therapy ? ?Chronic right-sided low back pain without sciatica ? ?DEGENERATIVE DISC DISEASE, LUMBAR SPINE ?-     predniSONE (DELTASONE) 50 MG tablet; Take one tablet for 5 days. ?-     Ambulatory referral to Physical Therapy ? ?Acute midline low back pain without sciatica ? ? ?No red flag back pain today ?No reason to repeat imaging ?I think pt could benefit from dry needling, PT referral made ?Encouraged warm compresses and tens unit at home ?Icy hot patches when tens unit is not on ?Start oral prednisone for 5 days ?Continue muscle relaxer and Vicodin as needed ?Follow up with Dr. Darene Lamer if not improving ?

## 2022-04-08 ENCOUNTER — Encounter: Payer: Self-pay | Admitting: Physician Assistant

## 2022-04-09 ENCOUNTER — Ambulatory Visit: Payer: Self-pay

## 2022-04-10 ENCOUNTER — Other Ambulatory Visit: Payer: Self-pay | Admitting: Sports Medicine

## 2022-04-10 DIAGNOSIS — M5030 Other cervical disc degeneration, unspecified cervical region: Secondary | ICD-10-CM

## 2022-04-11 ENCOUNTER — Encounter: Payer: Self-pay | Admitting: Physical Therapy

## 2022-04-11 ENCOUNTER — Ambulatory Visit: Payer: Self-pay | Attending: Physician Assistant | Admitting: Physical Therapy

## 2022-04-11 DIAGNOSIS — G8929 Other chronic pain: Secondary | ICD-10-CM | POA: Insufficient documentation

## 2022-04-11 DIAGNOSIS — M5137 Other intervertebral disc degeneration, lumbosacral region: Secondary | ICD-10-CM | POA: Insufficient documentation

## 2022-04-11 DIAGNOSIS — M6281 Muscle weakness (generalized): Secondary | ICD-10-CM | POA: Insufficient documentation

## 2022-04-11 DIAGNOSIS — M545 Low back pain, unspecified: Secondary | ICD-10-CM | POA: Insufficient documentation

## 2022-04-11 NOTE — Patient Instructions (Signed)
Access Code: [email or web address] ?URL: [email or web address] ?Date: 04/11/2022 ?Prepared by: Isabelle Course ? ?Exercises ?- Prone Quadriceps Stretch with Strap  - 1 x daily - 7 x weekly - 1 sets - 3 reps - 20-30 seconds hold ?- Seated Hip Flexor Stretch  - 1 x daily - 7 x weekly - 1 sets - 3 reps - 20-30 seconds hold ?- Child's Pose with Sidebending  - 1 x daily - 7 x weekly - 1 sets - 3 reps - 20-30 seconds hold ? ?Patient Education ?- Trigger Point Dry Needling ?
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
## 2022-04-16 ENCOUNTER — Ambulatory Visit: Payer: Self-pay | Admitting: Physical Therapy

## 2022-04-16 DIAGNOSIS — G8929 Other chronic pain: Secondary | ICD-10-CM

## 2022-04-16 DIAGNOSIS — M6281 Muscle weakness (generalized): Secondary | ICD-10-CM

## 2022-04-16 NOTE — Therapy (Signed)
[facility] ?[address] ?[place], [place], [address] ?Phone: [phone]   Fax:  [phone] ? ?Physical Therapy Treatment ? ?Patient Details  ?Name: Frances [name] ?MRN: [id number] ?Date of Birth: 05-Sep-1976 ?Referring Provider (PT): Iran Planas ? ? ?Encounter Date: 04/16/2022 ? ? PT End of Session - 04/16/22 1008   ? ? Visit Number 2   ? Number of Visits 12   ? Date for PT Re-Evaluation 05/23/22   ? PT Start Time 0930   ? PT Stop Time 1008   ? PT Time Calculation (min) 38 min   ? Activity Tolerance Patient tolerated treatment well   ? Behavior During Therapy [facility] for tasks assessed/performed   ? ?  ?  ? ?  ? ? ?Past Medical History:  ?Diagnosis Date  ? Anxiety   ? Arthritis   ? Back pain, chronic   ? Bipolar 1 disorder ([place])   ? Chronic fatigue syndrome   ? DDD (degenerative disc disease), lumbar   ? Depression   ? Diabetes mellitus without complication ([place])   ? Fibromyalgia   ? IUD 2012  ? Mirena  ? MTHFR gene mutation   ? ? ?Past Surgical History:  ?Procedure Laterality Date  ? COLONOSCOPY WITH PROPOFOL N/A 02/25/2017  ? Procedure: COLONOSCOPY WITH PROPOFOL;  Surgeon: Arta Silence, MD;  Location: [facility];  Service: Endoscopy;  Laterality: N/A;  ? left knee lateral release  1993  ? scar tisue removal  03/2005  ? left ankle  ? TONSILLECTOMY  age 63's  ? ? ?There were no vitals filed for this visit. ? ? Subjective Assessment - 04/16/22 0932   ? ? Subjective Pt states she thinks needling "helped a little". She states she is feeling "Better but not great"   ? Patient Stated Goals decrease pain   ? Currently in Pain? Yes   ? Pain Score 2    ? Pain Location Back   ? Pain Orientation Lower   ? Pain Descriptors / Indicators Sore   ? ?  ?  ? ?  ? ? ? ? ? [facility] PT Assessment - 04/16/22 0001   ? ?  ? Assessment  ? Medical Diagnosis acute Lt sided low back pain without sciatica   ? Referring Provider (PT) Iran Planas   ? Onset Date/Surgical Date 04/05/22    ? Next MD Visit PRN   ? Prior Therapy yes for same issue   ?  ? Palpation  ? Palpation comment TTP Lt lumbar paraspinals and multifidi, Lt QL   ? ?  ?  ? ?  ? ? ? ? ? ? ? ? ? ? ? ? ? ? ? ? [place] Adult PT Treatment/Exercise - 04/16/22 0001   ? ?  ? Lumbar Exercises: Stretches  ? Quad Stretch 2 reps;30 seconds   ? Quad Stretch Limitations prone with strap   ? Other Lumbar Stretch Exercise childs pose and with sidebending 2 x 20 sec each   ?  ? Lumbar Exercises: Aerobic  ? Nustep L5 x 5 min for warm up   ?  ? Lumbar Exercises: Standing  ? Shoulder Extension 20 reps   ? Theraband Level (Shoulder Extension) Level 3 (Green)   ? Shoulder Extension Limitations 3 sec hold   ? Other Standing Lumbar Exercises pallof green TB x 10 bilat   ?  ? Lumbar Exercises: Seated  ? Sit to Stand 15 reps   ? Sit to Stand Limitations  attempted with 10#KB - pain, then switched to 2kg med ball   ?  ? Lumbar Exercises: Supine  ? AB Set Limitations bicycle with green TB x 10 bilat   ? Bridge 20 reps   ? Bridge Limitations with green band around knees   ?  ? Manual Therapy  ? Manual Therapy Soft tissue mobilization   ? Soft tissue mobilization STM Lt lumbar paraspinals, QL   ? ?  ?  ? ?  ? ? ? ? ? ? ? ? ? ? PT Education - 04/16/22 1007   ? ? Education Details updated HEP   ? Person(s) Educated Patient   ? Methods Explanation;Demonstration;Handout   ? Comprehension Returned demonstration;Verbalized understanding   ? ?  ?  ? ?  ? ? ? ? ? ? PT Long Term Goals - 04/11/22 0845   ? ?  ? PT LONG TERM GOAL #1  ? Title Pt will be independent with HEP   ? Time 6   ? Period Weeks   ? Status New   ? Target Date 05/23/22   ?  ? PT LONG TERM GOAL #2  ? Title Pt will improve FOTO to >= 66 to demo improved functional mobility   ? Time 6   ? Period Weeks   ? Status New   ? Target Date 05/23/22   ?  ? PT LONG TERM GOAL #3  ? Title Pt will tolerate standing x 10 minutes with pain <= 1/10   ? Time 6   ? Period Weeks   ? Status New   ? Target Date 05/23/22   ? ?  ?   ? ?  ? ? ? ? ? ? ? ? Plan - 04/16/22 1008   ? ? Clinical Impression Statement Pt with good tolerance to loading of core muscles today. Some pain with sit to stand with 10# but reduced with decreased weight. HEP updated to include strengthening   ? PT Next Visit Plan progress core strength and flexibility - resisted walking?   ? PT Home Exercise Plan [email or web address]   ? Consulted and Agree with Plan of Care Patient   ? ?  ?  ? ?  ? ? ?Patient will benefit from skilled therapeutic intervention in order to improve the following deficits and impairments:    ? ?Visit Diagnosis: ?Chronic bilateral low back pain without sciatica ? ?Muscle weakness (generalized) ? ? ? ? ?Problem List ?Patient Active Problem List  ? Diagnosis Date Noted  ? Acute left-sided low back pain without sciatica 04/07/2022  ? DEGENERATIVE DISC DISEASE, LUMBAR SPINE 04/07/2022  ? Acute midline low back pain without sciatica 04/07/2022  ? Weak urine stream 03/17/2022  ? Proteinuria 03/17/2022  ? Chronic right hip pain 01/06/2022  ? Acute pain of right shoulder 01/06/2022  ? Right lateral epicondylitis 12/31/2021  ? ETD (Eustachian tube dysfunction), bilateral 08/23/2021  ? SOB (shortness of breath) on exertion 08/20/2021  ? Overweight (BMI 25.0-29.9) 06/18/2021  ? Attention deficit hyperactivity disorder (ADHD), 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  ? Hyperlipidemia LDL goal <70 03/18/2021  ? Type 2 diabetes mellitus with hyperglycemia, without long-term current use of insulin ([place]) 03/13/2021  ? Seasonal allergies 12/25/2020  ? Bilateral leg edema 11/19/2020  ? Costochondritis 10/22/2020  ? Rib pain on right side 10/22/2020  ? Tachycardia 10/22/2020  ? Symptomatic mammary hypertrophy 09/04/2020  ? Xiphoid pain 08/13/2020  ? Sternum pain 08/13/2020  ? Recurrent major depressive disorder, in partial remission ([place]) 05/24/2020  ?  Generalized anxiety disorder 05/24/2020  ? DDD (degenerative disc disease), cervical 03/13/2020  ? Ingrowing left great toenail  03/13/2020  ? Labral tear of hip, degenerative, right 01/11/2020  ? Grief reaction 11/28/2019  ? Toenail fungus 09/09/2019  ? Vasovagal response 09/09/2019  ? Decreased visual acuity 08/30/2018  ? Right corneal abrasion 08/30/2018  ? Elevated fasting glucose 04/08/2018  ? Anxiety 01/22/2018  ? Borderline personality disorder ([place]) 01/22/2018  ? Rheumatoid arthritis 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 ([place]) 12/09/2017  ? TMJ (temporomandibular joint syndrome) 09/26/2017  ? New daily persistent headache 09/26/2017  ? MTHFR gene mutation 09/20/2017  ? Bipolar I disorder ([place]) 09/16/2017  ? Chronic fatigue syndrome 09/16/2017  ? Back pain 10/13/2008  ? DERMATITIS, ALLERGIC 06/05/2008  ? 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 DDD (degenerative disc disease), lumbar 02/10/2008  ? FIBROMYALGIA 02/10/2008  ? IRON, SERUM, ELEVATED 02/10/2008  ? ? ?Francess Mullen, PT ?04/16/2022, 10:10 AM ? ?[place] [facility] ?[place] ?[place], [place], [address] ?Phone: [phone]   Fax:  [phone] ? ?Name: Frances [name] ?MRN: [id number] ?Date of Birth: 05-16-1976 ? ? ? ?

## 2022-04-16 NOTE — Patient Instructions (Signed)
Access Code: [email or web address] ?URL: [email or web address] ?Date: 04/16/2022 ?Prepared by: Isabelle Course ? ?Exercises ?- Prone Quadriceps Stretch with Strap  - 1 x daily - 7 x weekly - 1 sets - 3 reps - 20-30 seconds hold ?- Child's Pose with Sidebending  - 1 x daily - 7 x weekly - 1 sets - 3 reps - 20-30 seconds hold ?- Standing Anti-Rotation Press with Anchored Resistance  - 1 x daily - 7 x weekly - 2 sets - 10 reps ?- Shoulder extension with resistance - Neutral  - 1 x daily - 7 x weekly - 2 sets - 10 reps ?- Sit to Stand  - 1 x daily - 7 x weekly - 2 sets - 10 reps ?

## 2022-04-18 ENCOUNTER — Ambulatory Visit: Payer: Self-pay | Admitting: Physical Therapy

## 2022-04-18 DIAGNOSIS — M6281 Muscle weakness (generalized): Secondary | ICD-10-CM

## 2022-04-18 DIAGNOSIS — M545 Low back pain, unspecified: Secondary | ICD-10-CM

## 2022-04-18 NOTE — Therapy (Signed)
[place] ?[facility] ?[address] ?[place], [place], [address] ?Phone: [phone]   Fax:  [phone] ? ?Physical Therapy Treatment ? ?Patient Details  ?Name: Frances [name] ?MRN: [id number] ?Date of Birth: [birth year] ?Referring Provider (PT): Iran Planas ? ? ?Encounter Date: 04/18/2022 ? ? PT End of Session - 04/18/22 1444   ? ? Visit Number 3   ? Number of Visits 12   ? Date for PT Re-Evaluation 05/23/22   ? PT Start Time 1400   ? PT Stop Time 9794   ? PT Time Calculation (min) 42 min   ? Activity Tolerance Patient tolerated treatment well   ? Behavior During Therapy [facility] for tasks assessed/performed   ? ?  ?  ? ?  ? ? ?Past Medical History:  ?Diagnosis Date  ? Anxiety   ? Arthritis   ? Back pain, chronic   ? Bipolar 1 disorder ([place])   ? Chronic fatigue syndrome   ? DDD (degenerative disc disease), lumbar   ? Depression   ? Diabetes mellitus without complication ([place])   ? Fibromyalgia   ? IUD 2012  ? Mirena  ? MTHFR gene mutation   ? ? ?Past Surgical History:  ?Procedure Laterality Date  ? COLONOSCOPY WITH PROPOFOL N/A 02/25/2017  ? Procedure: COLONOSCOPY WITH PROPOFOL;  Surgeon: Arta Silence, MD;  Location: [facility];  Service: Endoscopy;  Laterality: N/A;  ? left knee lateral release  1993  ? scar tisue removal  03/2005  ? left ankle  ? TONSILLECTOMY  age 55's  ? ? ?There were no vitals filed for this visit. ? ? Subjective Assessment - 04/18/22 1402   ? ? Subjective Pt has a horse show tomorrow so she hopes that she can do that without pain   ? Patient Stated Goals decrease pain   ? Pain Score 2    ? Pain Location Back   ? Pain Orientation Lower   ? Pain Descriptors / Indicators Sore   ? ?  ?  ? ?  ? ? ? ? ? [facility] PT Assessment - 04/18/22 0001   ? ?  ? Assessment  ? Medical Diagnosis acute Lt sided low back pain without sciatica   ? Referring Provider (PT) Iran Planas   ? Onset Date/Surgical Date 04/05/22   ? Next MD Visit PRN   ? Prior Therapy yes for  same issue   ?  ? AROM  ? Lumbar Extension 50%   ? Lumbar - Right Side Bend 50%   ? Lumbar - Left Side Bend 75%   ? Lumbar - Right Rotation 75%   ? Lumbar - Left Rotation 75%   ? ?  ?  ? ?  ? ? ? ? ? ? ? ? ? ? ? ? ? ? ? ? [facility] [place] Adult PT Treatment/Exercise - 04/18/22 0001   ? ?  ? Lumbar Exercises: Stretches  ? Other Lumbar Stretch Exercise childs pose and with sidebending 2 x 20 sec each   ? Other Lumbar Stretch Exercise j curl x 10   ?  ? Lumbar Exercises: Aerobic  ? Nustep L5 x 5 min for warm up   ?  ? Lumbar Exercises: Standing  ? Shoulder Extension 20 reps   ? Theraband Level (Shoulder Extension) Level 3 (Green)   ? Shoulder Extension Limitations 3 sec hold   ? Other Standing Lumbar Exercises pallof green TB x 10 bilat   ? Other Standing Lumbar Exercises resisted walking  green TB x 10 fwd and bkwd   ?  ? Lumbar Exercises: Seated  ? Sit to Stand 20 reps   ? Sit to Stand Limitations 2kg ball   ?  ? Manual Therapy  ? Manual therapy comments skilled palpation to assess effects of dry needling   ? Soft tissue mobilization STM bilat lumbar paraspinals, QL   ? ?  ?  ? ?  ? ? ? Tr[place] Point Dry Needling - 04/18/22 0001   ? ? Consent Given? Yes   ? Education Handout Provided Previously provided   ? Erector spinae Response Palpable increased muscle length   ? Lumbar multifidi Response Palpable increased muscle length   ? ?  ?  ? ?  ? ? ? ? ? ? ? ? ? ? ? ? ? PT Long Term Goals - 04/11/22 0845   ? ?  ? PT LONG TERM GOAL #1  ? Title Pt will be independent with HEP   ? Time 6   ? Period Weeks   ? Status New   ? Target Date 05/23/22   ?  ? PT LONG TERM GOAL #2  ? Title Pt will improve FOTO to >= 66 to demo improved functional mobility   ? Time 6   ? Period Weeks   ? Status New   ? Target Date 05/23/22   ?  ? PT LONG TERM GOAL #3  ? Title Pt will tolerate standing x 10 minutes with pain <= 1/10   ? Time 6   ? Period Weeks   ? Status New   ? Target Date 05/23/22   ? ?  ?  ? ?  ? ? ? ? ? ? ? ? Plan - 04/18/22 1444   ? ? Clinical  Impression Statement Pt with good respone to continued exercise progression, especially flexion based exercise. Increased mm spasticity noted today, good response to dry needling   ? PT Next Visit Plan core strength and flexibility, farmers carry?   ? PT Home Exercise Plan [id number]   ? Consulted and Agree with Plan of Care Patient   ? ?  ?  ? ?  ? ? ?Patient will benefit from skilled therapeutic intervention in order to improve the following deficits and impairments:    ? ?Visit Diagnosis: ?Chronic bilateral low back pain without sciatica ? ?Muscle weakness (generalized) ? ? ? ? ?Problem List ?Patient Active Problem List  ? Diagnosis Date Noted  ? Acute left-sided low back pain without sciatica 04/07/2022  ? DEGENERATIVE DISC DISEASE, LUMBAR SPINE 04/07/2022  ? Acute midline low back pain without sciatica 04/07/2022  ? Weak urine stream 03/17/2022  ? Proteinuria 03/17/2022  ? Chronic right hip pain 01/06/2022  ? Acute pain of right shoulder 01/06/2022  ? Right lateral epicondylitis 12/31/2021  ? ETD (Eustachian tube dysfunction), bilateral 08/23/2021  ? SOB (shortness of breath) on exertion 08/20/2021  ? Overweight (BMI 25.0-29.9) 06/18/2021  ? Attention deficit hyperactivity disorder (ADHD), 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  ? Hyperlipidemia LDL goal <70 03/18/2021  ? Type 2 diabetes mellitus with hyperglycemia, without long-term current use of insulin ([place]) 03/13/2021  ? Seasonal allergies 12/25/2020  ? Bilateral leg edema 11/19/2020  ? Costochondritis 10/22/2020  ? Rib pain on right side 10/22/2020  ? Tachycardia 10/22/2020  ? Symptomatic mammary hypertrophy 09/04/2020  ? Xiphoid pain 08/13/2020  ? Sternum pain 08/13/2020  ? Recurrent major depressive disorder, in partial remission ([place]) 05/24/2020  ? Generalized anxiety disorder 05/24/2020  ?  DDD (degenerative disc disease), cervical 03/13/2020  ? Ingrowing left great toenail 03/13/2020  ? Labral tear of hip, degenerative, right 01/11/2020  ? Grief  reaction 11/28/2019  ? Toenail fungus 09/09/2019  ? Vasovagal response 09/09/2019  ? Decreased visual acuity 08/30/2018  ? Right corneal abrasion 08/30/2018  ? Elevated fasting glucose 04/08/2018  ? Anxiety 01/22/2018  ? Borderline personality disorder ([place]) 01/22/2018  ? Rheumatoid arthritis 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 ([place]) 12/09/2017  ? TMJ (temporomandibular joint syndrome) 09/26/2017  ? New daily persistent headache 09/26/2017  ? MTHFR gene mutation 09/20/2017  ? Bipolar I disorder ([place]) 09/16/2017  ? Chronic fatigue syndrome 09/16/2017  ? Back pain 10/13/2008  ? DERMATITIS, ALLERGIC 06/05/2008  ? 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 DDD (degenerative disc disease), lumbar 02/10/2008  ? FIBROMYALGIA 02/10/2008  ? IRON, SERUM, ELEVATED 02/10/2008  ? ? ?Helmi Hechavarria, PT ?04/18/2022, 2:46 PM ? ?[place] ?[facility] ?[address] ?[place], [place], [address] ?Phone: [phone]   Fax:  [phone] ? ?Name: COURNEY GARROD ?MRN: [id number] ?Date of Birth: [birth year] ? ? ? ?

## 2022-04-21 ENCOUNTER — Ambulatory Visit (INDEPENDENT_AMBULATORY_CARE_PROVIDER_SITE_OTHER): Payer: No Payment, Other | Admitting: Clinical

## 2022-04-21 DIAGNOSIS — F319 Bipolar disorder, unspecified: Secondary | ICD-10-CM
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
## 2022-04-22 ENCOUNTER — Ambulatory Visit: Payer: Self-pay | Attending: Physician Assistant | Admitting: Physical Therapy

## 2022-04-22 DIAGNOSIS — M6281 Muscle weakness (generalized): Secondary | ICD-10-CM | POA: Insufficient documentation

## 2022-04-22 DIAGNOSIS — M545 Low back pain, unspecified: Secondary | ICD-10-CM | POA: Insufficient documentation

## 2022-04-22 DIAGNOSIS — G8929 Other chronic pain: Secondary | ICD-10-CM | POA: Insufficient documentation

## 2022-04-22 NOTE — Therapy (Signed)
[place] ?[facility] ?[place] ?[place], [place], [address] ?Phone: [phone]   Fax:  [phone] ? ?Physical Therapy Treatment ? ?Patient Details  ?Name: Frances [name] ?MRN: [id number] ?Date of Birth: 01/28/1976 ?Referring Provider (PT): Iran Planas ? ? ?Encounter Date: 04/22/2022 ? ? PT End of Session - 04/22/22 0931   ? ? Visit Number 4   ? Number of Visits 12   ? Date for PT Re-Evaluation 05/23/22   ? PT Start Time [phone]   ? PT Stop Time 0930   ? PT Time Calculation (min) 40 min   ? Activity Tolerance Patient tolerated treatment well   ? Behavior During Therapy [facility] for tasks assessed/performed   ? ?  ?  ? ?  ? ? ?Past Medical History:  ?Diagnosis Date  ? Anxiety   ? Arthritis   ? Back pain, chronic   ? Bipolar 1 disorder ([place])   ? Chronic fatigue syndrome   ? DDD (degenerative disc disease), lumbar   ? Depression   ? Diabetes mellitus without complication ([place])   ? Fibromyalgia   ? IUD 2012  ? Mirena  ? MTHFR gene mutation   ? ? ?Past Surgical History:  ?Procedure Laterality Date  ? COLONOSCOPY WITH PROPOFOL N/A 02/25/2017  ? Procedure: COLONOSCOPY WITH PROPOFOL;  Surgeon: Arta Silence, MD;  Location: [facility];  Service: Endoscopy;  Laterality: N/A;  ? left knee lateral release  1993  ? scar tisue removal  03/2005  ? left ankle  ? TONSILLECTOMY  age 89's  ? ? ?There were no vitals filed for this visit. ? ? Subjective Assessment - 04/22/22 0852   ? ? Subjective Pt was able to tolerate the horse show with no pain during the show. She had a lot of pain 2 days later. She thinks the pain was from prolonged standing   ? Patient Stated Goals decrease pain   ? Currently in Pain? Yes   ? Pain Score 4    ? Pain Location Back   ? ?  ?  ? ?  ? ? ? ? ? [facility] PT Assessment - 04/22/22 0001   ? ?  ? Assessment  ? Medical Diagnosis acute Lt sided low back pain without sciatica   ? Referring Provider (PT) Iran Planas   ? Onset Date/Surgical Date 04/05/22   ? Next MD  Visit PRN   ? Prior Therapy yes for same issue   ? ?  ?  ? ?  ? ? ? ? ? ? ? ? ? ? ? ? ? ? ? ? [facility] Adult PT Treatment/Exercise - 04/22/22 0001   ? ?  ? Lumbar Exercises: Stretches  ? Other Lumbar Stretch Exercise childs pose and with sidebending 2 x 20 sec each   ? Other Lumbar Stretch Exercise j curl x 10   ?  ? Lumbar Exercises: Aerobic  ? Nustep L5 x 5 min for warm up   ?  ? Lumbar Exercises: Standing  ? Lifting Limitations trunk diagonals x 10 bilat blue med ball   ? Shoulder Extension 20 reps   ? Theraband Level (Shoulder Extension) Level 4 (Blue)   ? Shoulder Extension Limitations 3 sec hold   ? Other Standing Lumbar Exercises pallof green TB x 10 bilat   ? Other Standing Lumbar Exercises resisted walking green TB x 10 fwd and bkwd   ?  ? Lumbar Exercises: Seated  ? Sit to Stand 20 reps   ?  Sit to Stand Limitations 3kg med ball   ?  ? Manual Therapy  ? Soft tissue mobilization STM bilat lumbar paraspinals, QL   ? ?  ?  ? ?  ? ? ? ? ? ? ? ? ? ? ? ? ? ? ? PT Long Term Goals - 04/11/22 0845   ? ?  ? PT LONG TERM GOAL #1  ? Title Pt will be independent with HEP   ? Time 6   ? Period Weeks   ? Status New   ? Target Date 05/23/22   ?  ? PT LONG TERM GOAL #2  ? Title Pt will improve FOTO to >= 66 to demo improved functional mobility   ? Time 6   ? Period Weeks   ? Status New   ? Target Date 05/23/22   ?  ? PT LONG TERM GOAL #3  ? Title Pt will tolerate standing x 10 minutes with pain <= 1/10   ? Time 6   ? Period Weeks   ? Status New   ? Target Date 05/23/22   ? ?  ?  ? ?  ? ? ? ? ? ? ? ? Plan - 04/22/22 0933   ? ? Clinical Impression Statement Pt with increased tenderness to palpation lumbar paraspinals today. Good response to manual therapy. Able to tolerate exercise progression without symptoms. Pt educated on importance of increasing endurance to be able to stand for longer periods of time without pain   ? PT Next Visit Plan core strength and flexibility, farmers carry?   ? PT Home Exercise Plan [id number]   ?  Consulted and Agree with Plan of Care Patient   ? ?  ?  ? ?  ? ? ?Patient will benefit from skilled therapeutic intervention in order to improve the following deficits and impairments:    ? ?Visit Diagnosis: ?Chronic bilateral low back pain without sciatica ? ?Muscle weakness (generalized) ? ? ? ? ?Problem List ?Patient Active Problem List  ? Diagnosis Date Noted  ? Acute left-sided low back pain without sciatica 04/07/2022  ? DEGENERATIVE DISC DISEASE, LUMBAR SPINE 04/07/2022  ? Acute midline low back pain without sciatica 04/07/2022  ? Weak urine stream 03/17/2022  ? Proteinuria 03/17/2022  ? Chronic right hip pain 01/06/2022  ? Acute pain of right shoulder 01/06/2022  ? Right lateral epicondylitis 12/31/2021  ? ETD (Eustachian tube dysfunction), bilateral 08/23/2021  ? SOB (shortness of breath) on exertion 08/20/2021  ? Overweight (BMI 25.0-29.9) 06/18/2021  ? Attention deficit hyperactivity disorder (ADHD), 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  ? Hyperlipidemia LDL goal <70 03/18/2021  ? Type 2 diabetes mellitus with hyperglycemia, without long-term current use of insulin ([place]) 03/13/2021  ? Seasonal allergies 12/25/2020  ? Bilateral leg edema 11/19/2020  ? Costochondritis 10/22/2020  ? Rib pain on right side 10/22/2020  ? Tachycardia 10/22/2020  ? Symptomatic mammary hypertrophy 09/04/2020  ? Xiphoid pain 08/13/2020  ? Sternum pain 08/13/2020  ? Recurrent major depressive disorder, in partial remission ([place]) 05/24/2020  ? Generalized anxiety disorder 05/24/2020  ? DDD (degenerative disc disease), cervical 03/13/2020  ? Ingrowing left great toenail 03/13/2020  ? Labral tear of hip, degenerative, right 01/11/2020  ? Grief reaction 11/28/2019  ? Toenail fungus 09/09/2019  ? Vasovagal response 09/09/2019  ? Decreased visual acuity 08/30/2018  ? Right corneal abrasion 08/30/2018  ? Elevated fasting glucose 04/08/2018  ? Anxiety 01/22/2018  ? Borderline personality disorder ([place]) 01/22/2018  ? Rheumatoid  arthritis of  multiple sites 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 ([place]) 12/09/2017  ? TMJ (temporomandibular joint syndrome) 09/26/2017  ? New daily persistent headache 09/26/2017  ? MTHFR gene mutation 09/20/2017  ? Bipolar I disorder ([place]) 09/16/2017  ? Chronic fatigue syndrome 09/16/2017  ? Back pain 10/13/2008  ? DERMATITIS, ALLERGIC 06/05/2008  ? 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 DDD (degenerative disc disease), lumbar 02/10/2008  ? FIBROMYALGIA 02/10/2008  ? IRON, SERUM, ELEVATED 02/10/2008  ? ? ?Renee Beale, PT ?04/22/2022, 9:50 AM ? ?[place] ?[facility] ?[address] ?[place], [place], [address] ?Phone: [phone]   Fax:  [phone] ? ?Name: SUNITA DEMOND ?MRN: [id number] ?Date of Birth: 15-Jan-1976 ? ? ? ?

## 2022-04-24 ENCOUNTER — Ambulatory Visit (INDEPENDENT_AMBULATORY_CARE_PROVIDER_SITE_OTHER): Payer: Self-pay | Admitting: Specialist

## 2022-04-24 ENCOUNTER — Encounter: Payer: Self-pay | Admitting: Specialist

## 2022-04-24 VITALS — BP 107/75 | HR 106 | Ht 67.0 in | Wt 162.0 lb

## 2022-04-24 DIAGNOSIS — M5126 Other intervertebral disc displacement, lumbar region: Secondary | ICD-10-CM

## 2022-04-24 DIAGNOSIS — M5137 Other intervertebral disc degeneration, lumbosacral region: Secondary | ICD-10-CM

## 2022-04-24 DIAGNOSIS — M0609 Rheumatoid arthritis without rheumatoid factor, multiple sites: Secondary | ICD-10-CM

## 2022-04-24 DIAGNOSIS — M47816 Spondylosis without myelopathy or radiculopathy, lumbar region: Secondary | ICD-10-CM

## 2022-04-24 DIAGNOSIS — M4807 Spinal stenosis, lumbosacral region: Secondary | ICD-10-CM

## 2022-04-24 DIAGNOSIS — M48062 Spinal stenosis, lumbar region with neurogenic claudication: Secondary | ICD-10-CM

## 2022-04-24 MED FILL — HYDROXYCHLOROQUINE 200 MG TABLET: ORAL | 30 days supply | Qty: 60 | Fill #5

## 2022-04-24 MED FILL — CYMBALTA 30 MG CAPSULE,DELAYED RELEASE: ORAL | 30 days supply | Qty: 90 | Fill #2

## 2022-04-24 NOTE — Progress Notes (Addendum)
Office Visit Note   Patient: Frances [name]           Date of Birth: Oct 03, 1976           MRN: [id number] Visit Date: 04/24/2022              Requested by: Donella Stade, PA-C [name],  [place] [address] PCP: Donella Stade, PA-C   Assessment & Plan: Visit Diagnoses:  1. Spondylosis without myelopathy or radiculopathy, lumbar region   2. Facet hypertrophy of lumbar region   3. Rheumatoid arthritis 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 ([place])   4. DEGENERATIVE DISC DISEASE, LUMBAR SPINE   5. Spinal stenosis of lumbar region with neurogenic claudication   6. Spinal stenosis of lumbosacral region   7. Herniation of lumbar intervertebral disc     Plan: Avoid bending, stooping and avoid lifting weights greater than 10 lbs. Avoid prolong standing and walking. Avoid frequent bending and stooping  No lifting greater than 10 lbs. May use ice or moist heat for pain. Weight loss is of benefit. Handicap license is approved. Dr. Romona Curls secretary/Assistant will call to arrange for epidural steroid injection    Follow-Up Instructions: No follow-ups on file.   Orders:  Orders Placed This Encounter  Procedures   Ambulatory referral to Physical Medicine Rehab   No orders of the defined types were placed in this encounter.     Procedures: No procedures performed   Clinical Data: Findings:  CLINICAL DATA:  Persistent low back pain with left leg pain.   EXAM: MRI LUMBAR SPINE WITHOUT CONTRAST   TECHNIQUE: Multiplanar, multisequence MR imaging of the lumbar spine was performed. No intravenous contrast was administered.   COMPARISON:  MRI 03/24/2020   FINDINGS: Segmentation:  5 lumbar type vertebral bodies.   Alignment: Straightening of the normal lordosis. 2 mm retrolisthesis L4-5.   Vertebrae:  No fracture or primary bone lesion.   Conus medullaris and cauda equina: Conus extends to the L1 level. Conus and cauda  equina appear normal.   Paraspinal and other soft tissues: Negative   Disc levels:   T11-12, T12-L1 and L1-2: Normal   L2-3: Desiccation and mild bulging of the disc. Facet degeneration with facet and ligamentous hypertrophy. Mild narrowing of the lateral recesses but no apparent neural compression. No change since prior study.   L3-4: Desiccation and moderate bulging of the disc. Facet and ligamentous hypertrophy. Mild narrowing of the lateral recesses but no visible neural compression. No change since prior study.   L4-5: 2 mm retrolisthesis. Moderate bulging of the disc. Bilateral facet and ligamentous hypertrophy. Stenosis of both subarticular lateral recesses that could cause neural compression on either or both sides. Findings have worsened slightly at this level.   L5-S1: Small central disc protrusion without compressive effect upon the thecal sac or S1 nerves. Mild bilateral facet degeneration. Slight involution of the disc protrusion when compared to the study of last year.   IMPRESSION: L2-3 and L3-4: Disc bulges and facet hypertrophy. Mild stenosis of the lateral recesses without visible neural compression. No significant change since last year. The findings could certainly relate to back pain or referred facet syndrome pain.   L4-5: 2 mm retrolisthesis. Moderate bulging of the disc. Bilateral facet and ligamentous hypertrophy. Stenosis of both lateral recesses that could cause neural compression on either or both sides. Slight worsening of the findings since last year.   L5-S1: Slight involution of a central disc  protrusion. Presently, there is no apparent compressive effect upon the thecal sac or S1 nerves. Mild facet osteoarthritis.     Electronically Signed   By: Nelson Chimes M.D.   On: 05/26/2021 12:46      Subjective: Chief Complaint  Patient presents with   Lower Back - Follow-up    46 year old female with history of lumbar pain and findings  of arthrosis of the facets diffusely L23- through L5-S1 with subarticular stenosis L4-5 and a small HNP that is noncompressive L5-S1. She reports a worsening of symptoms about 2.5 weeks ago and then she went to an Urgent Care then and  She was given toradol and depomedrol. She went to her primary care the next day due to pain. She is taking plaquenil. Currently taking meds of vicodin and a muscle relaxer, methocarbamol.    Review of Systems  Constitutional: Negative.   HENT: Negative.    Eyes: Negative.   Respiratory: Negative.    Cardiovascular: Negative.   Gastrointestinal: Negative.   Endocrine: Negative.   Genitourinary: Negative.   Musculoskeletal: Negative.   Skin: Negative.   Allergic/Immunologic: Negative.   Neurological: Negative.   Hematological: Negative.   Psychiatric/Behavioral: Negative.       Objective: Vital Signs: BP 107/75   Pulse (!) 106   Ht '5\' 7"'$  (1.702 m)   Wt 162 lb (73.5 kg)   BMI 25.37 kg/m   Physical Exam Constitutional:      Appearance: She is well-developed.  HENT:     Head: Normocephalic and atraumatic.  Eyes:     Pupils: Pupils are equal, round, and reactive to light.  Pulmonary:     Effort: Pulmonary effort is normal.     Breath sounds: Normal breath sounds.  Abdominal:     General: Bowel sounds are normal.     Palpations: Abdomen is soft.  Musculoskeletal:     Cervical back: Normal range of motion and neck supple.     Lumbar back: Negative right straight leg raise test and negative left straight leg raise test.  Skin:    General: Skin is warm and dry.  Neurological:     Mental Status: She is alert and oriented to person, place, and time.  Psychiatric:        Behavior: Behavior normal.        Thought Content: Thought content normal.        Judgment: Judgment normal.    Back Exam   Tenderness  The patient is experiencing tenderness in the lumbar.  Range of Motion  Extension:  abnormal  Flexion:  abnormal  Lateral bend  right:  abnormal  Lateral bend left:  abnormal  Rotation right:  abnormal  Rotation left:  abnormal   Muscle Strength  Right Quadriceps:  5/5  Left Quadriceps:  5/5  Right Hamstrings:  5/5  Left Hamstrings:  5/5   Tests  Straight leg raise right: negative Straight leg raise left: negative  Reflexes  Patellar:  2/4  Other  Toe walk: normal Heel walk: normal Erythema: no back redness Scars: absent     Specialty Comments:  No specialty comments available.  Imaging: No results found.   PMFS History: Patient Active Problem List   Diagnosis Date Noted   Acute left-sided low back pain without sciatica 04/07/2022   DEGENERATIVE DISC DISEASE, LUMBAR SPINE 04/07/2022   Acute midline low back pain without sciatica 04/07/2022   Weak urine stream 03/17/2022   Proteinuria 03/17/2022   Chronic right hip pain  01/06/2022   Acute pain of right shoulder 01/06/2022   Right lateral epicondylitis 12/31/2021   ETD (Eustachian tube dysfunction), bilateral 08/23/2021   SOB (shortness of breath) on exertion 08/20/2021   Overweight (BMI 25.0-29.9) 06/18/2021   Attention deficit hyperactivity disorder (ADHD), 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   Hyperlipidemia LDL goal <70 03/18/2021   Type 2 diabetes mellitus with hyperglycemia, without long-term current use of insulin ([place]) 03/13/2021   Seasonal allergies 12/25/2020   Bilateral leg edema 11/19/2020   Costochondritis 10/22/2020   Rib pain on right side 10/22/2020   Tachycardia 10/22/2020   Symptomatic mammary hypertrophy 09/04/2020   Xiphoid pain 08/13/2020   Sternum pain 08/13/2020   Recurrent major depressive disorder, in partial remission ([place]) 05/24/2020   Generalized anxiety disorder 05/24/2020   DDD (degenerative disc disease), cervical 03/13/2020   Ingrowing left great toenail 03/13/2020   Labral tear of hip, degenerative, right 01/11/2020   Grief reaction 11/28/2019   Toenail fungus 09/09/2019   Vasovagal  response 09/09/2019   Decreased visual acuity 08/30/2018   Right corneal abrasion 08/30/2018   Elevated fasting glucose 04/08/2018   Anxiety 01/22/2018   Borderline personality disorder ([place]) 01/22/2018   Rheumatoid arthritis 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 ([place]) 12/09/2017   TMJ (temporomandibular joint syndrome) 09/26/2017   New daily persistent headache 09/26/2017   MTHFR gene mutation 09/20/2017   Bipolar I disorder ([place]) 09/16/2017   Chronic fatigue syndrome 09/16/2017   Back pain 10/13/2008   DERMATITIS, ALLERGIC 06/05/2008   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   DDD (degenerative disc disease), lumbar 02/10/2008   FIBROMYALGIA 02/10/2008   IRON, SERUM, ELEVATED 02/10/2008   Past Medical History:  Diagnosis Date   Anxiety    Arthritis    Back pain, chronic    Bipolar 1 disorder ([facility])    Chronic fatigue syndrome    DDD (degenerative disc disease), lumbar    Depression    Diabetes mellitus without complication ([place])    Fibromyalgia    IUD 2012   Mirena   MTHFR gene mutation     Family History  Problem Relation Age of Onset   Diabetes Mother    Stroke Mother    Lupus Mother    Hypertension Mother    Congestive Heart Failure Mother    Mental illness Brother        Not clear what his diagnosis is--may be related to previous drug use.   Cancer Maternal Grandmother        colon   Depression Maternal Uncle    Alcohol abuse Maternal Uncle    Diabetes Maternal Grandfather     Past Surgical History:  Procedure Laterality Date   COLONOSCOPY WITH PROPOFOL N/A 02/25/2017   Procedure: COLONOSCOPY WITH PROPOFOL;  Surgeon: Arta Silence, MD;  Location: [facility];  Service: Endoscopy;  Laterality: N/A;   left knee lateral release  1993   scar tisue removal  03/2005   left ankle   TONSILLECTOMY  age 46's   Social History   Occupational History   Occupation: Designer, industrial/product at times  Tobacco Use   Smoking status: Never   Smokeless tobacco: Never   Vaping Use   Vaping Use: Never used  Substance and Sexual Activity   Alcohol use: No    Alcohol/week: 0.0 standard drinks of alcohol   Drug use: No   Sexual activity: Yes    Birth control/protection: I.U.D.

## 2022-04-24 NOTE — Patient Instructions (Signed)
Plan: Avoid bending, stooping and avoid lifting weights greater than 10 lbs. ?Avoid prolong standing and walking. ?Avoid frequent bending and stooping  ?No lifting greater than 10 lbs. ?May use ice or moist heat for pain. ?Weight loss is of benefit. ?Handicap license is approved. ?Dr. Romona Curls secretary/Assistant will call to arrange for epidural steroid inject ?

## 2022-04-25 ENCOUNTER — Encounter: Payer: Self-pay | Admitting: Physical Therapy

## 2022-04-26 ENCOUNTER — Encounter (HOSPITAL_COMMUNITY): Payer: Self-pay

## 2022-04-26 NOTE — Plan of Care (Signed)
?  Problem: Depression CCP Problem  1  Goal: LTG: Frances "[name]" WILL SCORE LESS THAN 10 ON THE PATIENT HEALTH QUESTIONNAIRE (PHQ-9) Outcome: Progressing Goal: STG: [name] WILL IDENTIFY 3 COGNITIVE PATTERNS AND BELIEFS THAT SUPPORT DEPRESSION Outcome: Progressing   

## 2022-04-28 ENCOUNTER — Other Ambulatory Visit: Payer: Self-pay | Admitting: Physician Assistant

## 2022-04-29 ENCOUNTER — Telehealth: Payer: Self-pay | Admitting: Physical Medicine and Rehabilitation

## 2022-04-29 ENCOUNTER — Ambulatory Visit: Payer: Self-pay | Admitting: Physical Therapy

## 2022-04-29 DIAGNOSIS — M6281 Muscle weakness (generalized): Secondary | ICD-10-CM

## 2022-04-29 DIAGNOSIS — G8929 Other chronic pain: Secondary | ICD-10-CM

## 2022-04-29 NOTE — Telephone Encounter (Signed)
Patient called needing to schedule an appointment with Dr. Ernestina Patches. Patient said she was referred to Dr. Ernestina Patches. The number to contact patient is [phone] ?

## 2022-04-29 NOTE — Therapy (Signed)
[place] [facility] ?[place] ?[place], [place], [address] ?Phone: [phone]   Fax:  [phone] ? ?Physical Therapy Treatment ? ?Patient Details  ?Name: Frances [name] ?MRN: [id number] ?Date of Birth: March 21, 1976 ?Referring Provider (PT): Iran Planas ? ? ?Encounter Date: 04/29/2022 ? ? PT End of Session - 04/29/22 0925   ? ? Visit Number 5   ? Number of Visits 12   ? Date for PT Re-Evaluation 05/23/22   ? PT Start Time 0845   ? PT Stop Time 0925   ? PT Time Calculation (min) 40 min   ? Activity Tolerance Patient tolerated treatment well   ? Behavior During Therapy [facility] for tasks assessed/performed   ? ?  ?  ? ?  ? ? ?Past Medical History:  ?Diagnosis Date  ? Anxiety   ? Arthritis   ? Back pain, chronic   ? Bipolar 1 disorder ([place])   ? Chronic fatigue syndrome   ? DDD (degenerative disc disease), lumbar   ? Depression   ? Diabetes mellitus without complication ([place])   ? Fibromyalgia   ? IUD 2012  ? Mirena  ? MTHFR gene mutation   ? ? ?Past Surgical History:  ?Procedure Laterality Date  ? COLONOSCOPY WITH PROPOFOL N/A 02/25/2017  ? Procedure: COLONOSCOPY WITH PROPOFOL;  Surgeon: Arta Silence, MD;  Location: [facility];  Service: Endoscopy;  Laterality: N/A;  ? left knee lateral release  1993  ? scar tisue removal  03/2005  ? left ankle  ? TONSILLECTOMY  age 40's  ? ? ?There were no vitals filed for this visit. ? ? Subjective Assessment - 04/29/22 0851   ? ? Subjective Pt had an MD appointment and states the want to try "a new kind of injection". She states she is less sore but continues to have twinges of pain   ? Currently in Pain? Yes   ? Pain Score 3    ? Pain Location Back   ? Pain Orientation Lower   ? Pain Descriptors / Indicators Sore   ? ?  ?  ? ?  ? ? ? ? ? [facility] PT Assessment - 04/29/22 0001   ? ?  ? Assessment  ? Medical Diagnosis acute Lt sided low back pain without sciatica   ? Referring Provider (PT) Iran Planas   ? Onset Date/Surgical Date  04/05/22   ? Next MD Visit PRN   ? Prior Therapy yes for same issue   ?  ? Strength  ? Right Hip Extension 4-/5   ? Left Hip Extension 3+/5   ? ?  ?  ? ?  ? ? ? ? ? ? ? ? ? ? ? ? ? ? ? ? [facility] Adult PT Treatment/Exercise - 04/29/22 0001   ? ?  ? Lumbar Exercises: Aerobic  ? Nustep L5 x 5 min for warm up   ?  ? Lumbar Exercises: Standing  ? Lifting Limitations trunk diagonals x 10 bilat blue med ball   ? Other Standing Lumbar Exercises farmers carry 10#KB 1 lap each, pallof green TB x 15 each way   ? Other Standing Lumbar Exercises resisted walking green TB x 10 fwd and bkwd   ?  ? Lumbar Exercises: Seated  ? Sit to Stand 20 reps   ? Sit to Stand Limitations 3kg med ball   ?  ? Manual Therapy  ? Manual therapy comments skilled palpation to assess effects of dry needling   ?  Soft tissue mobilization STM bilat lumbar paraspinals, QL   ? ?  ?  ? ?  ? ? ? Trigger Point Dry Needling - 04/29/22 0001   ? ? Consent Given? Yes   ? Education Handout Provided Previously provided   ? Erector spinae Response Palpable increased muscle length   ? Lumbar multifidi Response Palpable increased muscle length   ? ?  ?  ? ?  ? ? ? ? ? ? ? ? ? ? ? ? ? PT Long Term Goals - 04/11/22 0845   ? ?  ? PT LONG TERM GOAL #1  ? Title Pt will be independent with HEP   ? Time 6   ? Period Weeks   ? Status New   ? Target Date 05/23/22   ?  ? PT LONG TERM GOAL #2  ? Title Pt will improve FOTO to >= 66 to demo improved functional mobility   ? Time 6   ? Period Weeks   ? Status New   ? Target Date 05/23/22   ?  ? PT LONG TERM GOAL #3  ? Title Pt will tolerate standing x 10 minutes with pain <= 1/10   ? Time 6   ? Period Weeks   ? Status New   ? Target Date 05/23/22   ? ?  ?  ? ?  ? ? ? ? ? ? ? ? Plan - 04/29/22 0930   ? ? Clinical Impression Statement Pt is improving activity tolerance. Still with limited endurance for standing but improving core strength. Continued hip extension weakness which will benefit from continued strengthening   ? PT Next Visit  Plan bridges, hip extension strength   ? PT Home Exercise Plan [id number]   ? Consulted and Agree with Plan of Care Patient   ? ?  ?  ? ?  ? ? ?Patient will benefit from skilled therapeutic intervention in order to improve the following deficits and impairments:    ? ?Visit Diagnosis: ?Chronic bilateral low back pain without sciatica ? ?Muscle weakness (generalized) ? ? ? ? ?Problem List ?Patient Active Problem List  ? Diagnosis Date Noted  ? Acute left-sided low back pain without sciatica 04/07/2022  ? DEGENERATIVE DISC DISEASE, LUMBAR SPINE 04/07/2022  ? Acute midline low back pain without sciatica 04/07/2022  ? Weak urine stream 03/17/2022  ? Proteinuria 03/17/2022  ? Chronic right hip pain 01/06/2022  ? Acute pain of right shoulder 01/06/2022  ? Right lateral epicondylitis 12/31/2021  ? ETD (Eustachian tube dysfunction), bilateral 08/23/2021  ? SOB (shortness of breath) on exertion 08/20/2021  ? Overweight (BMI 25.0-29.9) 06/18/2021  ? Attention deficit hyperactivity disorder (ADHD), 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  ? Hyperlipidemia LDL goal <70 03/18/2021  ? Type 2 diabetes mellitus with hyperglycemia, without long-term current use of insulin ([place]) 03/13/2021  ? Seasonal allergies 12/25/2020  ? Bilateral leg edema 11/19/2020  ? Costochondritis 10/22/2020  ? Rib pain on right side 10/22/2020  ? Tachycardia 10/22/2020  ? Symptomatic mammary hypertrophy 09/04/2020  ? Xiphoid pain 08/13/2020  ? Sternum pain 08/13/2020  ? Recurrent major depressive disorder, in partial remission ([place]) 05/24/2020  ? Generalized anxiety disorder 05/24/2020  ? DDD (degenerative disc disease), cervical 03/13/2020  ? Ingrowing left great toenail 03/13/2020  ? Labral tear of hip, degenerative, right 01/11/2020  ? Grief reaction 11/28/2019  ? Toenail fungus 09/09/2019  ? Vasovagal response 09/09/2019  ? Decreased visual acuity 08/30/2018  ? Right corneal abrasion 08/30/2018  ? Elevated  fasting glucose 04/08/2018  ? Anxiety  01/22/2018  ? Borderline personality disorder ([place]) 01/22/2018  ? Rheumatoid arthritis 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 ([place]) 12/09/2017  ? TMJ (temporomandibular joint syndrome) 09/26/2017  ? New daily persistent headache 09/26/2017  ? MTHFR gene mutation 09/20/2017  ? Bipolar I disorder ([place]) 09/16/2017  ? Chronic fatigue syndrome 09/16/2017  ? Back pain 10/13/2008  ? DERMATITIS, ALLERGIC 06/05/2008  ? 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 DDD (degenerative disc disease), lumbar 02/10/2008  ? FIBROMYALGIA 02/10/2008  ? IRON, SERUM, ELEVATED 02/10/2008  ? ? ?Daud Cayer, PT ?04/29/2022, 9:34 AM ? ?[place] ?[facility] ?[place] ?[place], [place], [address] ?Phone: [phone]   Fax:  [phone] ? ?Name: Frances [name] ?MRN: [id number] ?Date of Birth: Mar 08, 1976 ? ? ? ?

## 2022-04-29 NOTE — Telephone Encounter (Signed)
She has been scheduled.

## 2022-05-02 ENCOUNTER — Encounter: Payer: Self-pay | Admitting: Physical Therapy

## 2022-05-05 NOTE — Telephone Encounter (Signed)
err

## 2022-05-13 ENCOUNTER — Other Ambulatory Visit: Payer: Self-pay | Admitting: Neurology

## 2022-05-13 ENCOUNTER — Ambulatory Visit: Payer: Self-pay | Admitting: Physical Therapy

## 2022-05-13 DIAGNOSIS — M51369 Other intervertebral disc degeneration, lumbar region without mention of lumbar back pain or lower extremity pain: Secondary | ICD-10-CM

## 2022-05-13 DIAGNOSIS — G8929 Other chronic pain: Secondary | ICD-10-CM

## 2022-05-13 DIAGNOSIS — M5136 Other intervertebral disc degeneration, lumbar region: Secondary | ICD-10-CM

## 2022-05-13 DIAGNOSIS — M545 Low back pain, unspecified: Secondary | ICD-10-CM

## 2022-05-13 DIAGNOSIS — M0609 Rheumatoid arthritis without rheumatoid factor, multiple sites: Secondary | ICD-10-CM

## 2022-05-13 DIAGNOSIS — M6281 Muscle weakness (generalized): Secondary | ICD-10-CM

## 2022-05-13 MED ORDER — HYDROCODONE-ACETAMINOPHEN 5-325 MG PO TABS: 1.0000 | ORAL_TABLET | Freq: Two times a day (BID) | ORAL | 0 refills | Status: DC | PRN

## 2022-05-13 NOTE — Therapy (Signed)
[place], [place], [address] Phone: [phone]   Fax:  [phone]  Physical Therapy Treatment  Patient Details  Name: Frances [name] MRN: [id number] Date of Birth: [birth year] Referring Provider (PT): Iran Planas   Encounter Date: 05/13/2022   PT End of Session - 05/13/22 9449     Visit Number 6    Number of Visits 12    Date for PT Re-Evaluation 05/23/22    PT Start Time 0845    PT Stop Time 0925    PT Time Calculation (min) 40 min    Activity Tolerance Patient tolerated treatment well    Behavior During Therapy [facility] for tasks assessed/performed             Past Medical History:  Diagnosis Date   Anxiety    Arthritis    Back pain, chronic    Bipolar 1 disorder ([place])    Chronic fatigue syndrome    DDD (degenerative disc disease), lumbar    Depression    Diabetes mellitus without complication ([place])    Fibromyalgia    IUD 2012   Mirena   MTHFR gene mutation     Past Surgical History:  Procedure Laterality Date   COLONOSCOPY WITH PROPOFOL N/A 02/25/2017   Procedure: COLONOSCOPY WITH PROPOFOL;  Surgeon: Arta Silence, MD;  Location: [facility];  Service: Endoscopy;  Laterality: N/A;   left knee lateral release  1993   scar tisue removal  03/2005   left ankle   TONSILLECTOMY  age 46's    There were no vitals filed for this visit.   Subjective Assessment - 05/13/22 0852     Subjective Pt states her back was feeling good but this morning she woke up sore. She states she has pain on her Lt side    Patient Stated Goals decrease pain    Currently in Pain? Yes    Pain Score 3     Pain Location Back    Pain Orientation Lower                [facility] PT Assessment - 05/13/22 0001       Assessment   Medical Diagnosis acute Lt sided low back pain without sciatica    Referring Provider (PT) Iran Planas    Onset Date/Surgical Date 04/05/22    Next MD Visit PRN     Prior Therapy yes for same issue      AROM   Lumbar - Right Rotation 100%    Lumbar - Left Rotation 100%                           [facility] Adult PT Treatment/Exercise - 05/13/22 0001       Lumbar Exercises: Stretches   Other Lumbar Stretch Exercise cat/cow straight and laterally      Lumbar Exercises: Aerobic   Stationary Bike L3 x 3 min      Lumbar Exercises: Standing   Lifting Limitations trunk diagonals x 10 bilat blue med ball    Other Standing Lumbar Exercises pallof press blue x 10 bilat, X band step red 5 steps x 4 bilat    Other Standing Lumbar Exercises resisted walking green TB x 10 fwd and bkwd      Lumbar Exercises: Seated   Sit to Stand 20 reps    Sit to Stand Limitations 3kg med ball      Manual Therapy  Manual therapy comments skilled palpation to assess effects of dry needling    Soft tissue mobilization STM bilat lumbar paraspinals, QL              Trigger Point Dry Needling - 05/13/22 0001     Consent Given? Yes    Education Handout Provided Previously provided    Erector spinae Response Palpable increased muscle length    Lumbar multifidi Response Palpable increased muscle length                        PT Long Term Goals - 04/11/22 0845       PT LONG TERM GOAL #1   Title Pt will be independent with HEP    Time 6    Period Weeks    Status New    Target Date 05/23/22      PT LONG TERM GOAL #2   Title Pt will improve FOTO to >= 66 to demo improved functional mobility    Time 6    Period Weeks    Status New    Target Date 05/23/22      PT LONG TERM GOAL #3   Title Pt will tolerate standing x 10 minutes with pain <= 1/10    Time 6    Period Weeks    Status New    Target Date 05/23/22                   Plan - 05/13/22 0924     Clinical Impression Statement Pt is progressing well towards goals. Good response to dry needling. She continues with hip and core weakness and will benefit from  continued strengthening.    PT Next Visit Plan check goals, d/c, update HEP    PT Home Exercise Plan [id number]    Consulted and Agree with Plan of Care Patient             Patient will benefit from skilled therapeutic intervention in order to improve the following deficits and impairments:     Visit Diagnosis: Chronic bilateral low back pain without sciatica  Muscle weakness (generalized)     Problem List Patient Active Problem List   Diagnosis Date Noted   Acute left-sided low back pain without sciatica 04/07/2022   DEGENERATIVE DISC DISEASE, LUMBAR SPINE 04/07/2022   Acute midline low back pain without sciatica 04/07/2022   Weak urine stream 03/17/2022   Proteinuria 03/17/2022   Chronic right hip pain 01/06/2022   Acute pain of right shoulder 01/06/2022   Right lateral epicondylitis 12/31/2021   ETD (Eustachian tube dysfunction), bilateral 08/23/2021   SOB (shortness of breath) on exertion 08/20/2021   Overweight (BMI 25.0-29.9) 06/18/2021   Attention deficit hyperactivity disorder (ADHD), 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   Hyperlipidemia LDL goal <70 03/18/2021   Type 2 diabetes mellitus with hyperglycemia, without long-term current use of insulin ([place]) 03/13/2021   Seasonal allergies 12/25/2020   Bilateral leg edema 11/19/2020   Costochondritis 10/22/2020   Rib pain on right side 10/22/2020   Tachycardia 10/22/2020   Symptomatic mammary hypertrophy 09/04/2020   Xiphoid pain 08/13/2020   Sternum pain 08/13/2020   Recurrent major depressive disorder, in partial remission ([place]) 05/24/2020   Generalized anxiety disorder 05/24/2020   DDD (degenerative disc disease), cervical 03/13/2020   Ingrowing left great toenail 03/13/2020   Labral tear of hip, degenerative, right 01/11/2020   Grief reaction 11/28/2019   Toenail fungus 09/09/2019   Vasovagal response 09/09/2019  Decreased visual acuity 08/30/2018   Right corneal abrasion 08/30/2018   Elevated  fasting glucose 04/08/2018   Anxiety 01/22/2018   Borderline personality disorder ([place]) 01/22/2018   Rheumatoid arthritis 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 ([place]) 12/09/2017   TMJ (temporomandibular joint syndrome) 09/26/2017   New daily persistent headache 09/26/2017   MTHFR gene mutation 09/20/2017   Bipolar I disorder ([place]) 09/16/2017   Chronic fatigue syndrome 09/16/2017   Back pain 10/13/2008   DERMATITIS, ALLERGIC 06/05/2008   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   DDD (degenerative disc disease), lumbar 02/10/2008   FIBROMYALGIA 02/10/2008   IRON, SERUM, ELEVATED 02/10/2008   Rationale for Evaluation and Treatment Rehabilitation  [place], PT 05/13/2022, 9:26 AM  [facility] [place], [place], [address] Phone: [phone]   Fax:  [phone]  Name: MANETTE DOTO MRN: [id number] Date of Birth: [birth year]

## 2022-05-13 NOTE — Telephone Encounter (Signed)
Patient left vm requesting refill of Vicodin On pain contract Last written 03/17/2022 #60 no refills Last appt 04/07/2022

## 2022-05-14 NOTE — Telephone Encounter (Signed)
Medication was refilled yesterday.

## 2022-05-15 ENCOUNTER — Ambulatory Visit (INDEPENDENT_AMBULATORY_CARE_PROVIDER_SITE_OTHER): Payer: No Payment, Other | Admitting: Clinical

## 2022-05-15 ENCOUNTER — Ambulatory Visit: Payer: Self-pay | Admitting: Specialist

## 2022-05-15 DIAGNOSIS — F319 Bipolar disorder, unspecified: Secondary | ICD-10-CM | POA: Diagnosis not present

## 2022-05-15 NOTE — Progress Notes (Signed)
THERAPIST PROGRESS NOTE Virtual Visit via Video Note  I connected with Frances [name] on 05/15/2022 at  2:00 PM EDT by a video enabled telemedicine application and verified that I am speaking with the correct person using two identifiers.  Location: Patient: home Provider: office   I discussed the limitations of evaluation and management by telemedicine and the availability of in person appointments. The patient expressed understanding and agreed to proceed.   Follow Up Instructions: I discussed the assessment and treatment plan with the patient. The patient was provided an opportunity to ask questions and all were answered. The patient agreed with the plan and demonstrated an understanding of the instructions.   The patient was advised to call back or seek an in-person evaluation if the symptoms worsen or if the condition fails to improve as anticipated.   Session Time: 30 minutes  Participation Level: Active  Behavioral Response: CasualAlertDepressed  Type of Therapy: Individual Therapy  Treatment Goals addressed: client will discuss 3 cognitive patterns and beliefs that support depression  ProgressTowards Goals: Progressing  Interventions: CBT and Supportive  Summary:  Frances [name] is a 46 y.o. female who presents for the scheduled session oriented times five, appropriately dressed, and friendly. Client denied hallucinations and delusions. Client reported her friend has transitioned from staying at her house. Client reported she is happy to have full use of her own space again. Client reported she found some of her old journals from previous sessions in therapy with other therapist. Client reported it includes journal entries she wrote on her feelings about her dad. Client reported because her dad didn't think she was enough she doesn't feel any different about herself. Client reported "I know it's not my fault about what my father did but I can't accept it".  Client reported she doesn't care to repair the relationship with her father. Client reported during childhood her father lived close by so he could have visited but he didn't. Client reported she does maintain a relationship with her siblings. Client reported her sister and niece came to visit her not too long ago.  Evidence of progress towards goal:  client reported 2 positive qualities about herself. Client also discussed 1 barrier pertaining to processing her childhood trauma.   Suicidal/Homicidal: Nowithout intent/plan  Therapist Response:  Therapist began the appointment asking the client how she has been doing since last seen Therapist used CBT to engage using active listening and positive emotional support. Therapist used CBT to engage asking the client about childhood experiences that contribute to depressive symptoms. Therapist used CBT to ask the client open ended questions for barriers related to processing childhood trauma. Therapist used CBT ask the client to identify her progress with frequency of use with coping skills with continued practice in her daily activity.    Therapist assigned the client homework to use her journal to write in her journal. Client was scheduled for next appointment.    Plan: Return again in 5 weeks.  Diagnosis: bipolar 1 disorder  Collaboration of Care: Patient refused AEB none.  Patient/Guardian was advised Release of Information must be obtained prior to any record release in order to collaborate their care with an outside provider. Patient/Guardian was advised if they have not already done so to contact the registration department to sign all necessary forms in order for [place] to release information regarding their care.   Consent: Patient/Guardian gives verbal consent for treatment and assignment of benefits for services provided during this visit. Patient/Guardian expressed  understanding and agreed to proceed.   [place], LCSW 05/15/2022

## 2022-05-17 NOTE — Plan of Care (Signed)
  Problem: Depression CCP Problem  1  Goal: LTG: Frances "[name]" WILL SCORE LESS THAN 10 ON THE PATIENT HEALTH QUESTIONNAIRE (PHQ-9) Outcome: Progressing Goal: STG: Frances "[name]" WILL IDENTIFY 3 COGNITIVE PATTERNS AND BELIEFS THAT SUPPORT DEPRESSION Outcome: Progressing   

## 2022-05-22 ENCOUNTER — Ambulatory Visit: Payer: Self-pay | Attending: Physician Assistant | Admitting: Physical Therapy

## 2022-05-22 DIAGNOSIS — M6281 Muscle weakness (generalized): Secondary | ICD-10-CM | POA: Insufficient documentation

## 2022-05-22 DIAGNOSIS — M545 Low back pain, unspecified: Secondary | ICD-10-CM | POA: Insufficient documentation

## 2022-05-22 DIAGNOSIS — G8929 Other chronic pain: Secondary | ICD-10-CM | POA: Insufficient documentation

## 2022-05-22 DIAGNOSIS — M25551 Pain in right hip: Secondary | ICD-10-CM | POA: Insufficient documentation

## 2022-05-22 DIAGNOSIS — R293 Abnormal posture: Secondary | ICD-10-CM | POA: Insufficient documentation

## 2022-05-22 NOTE — Therapy (Signed)
[place], [place], [address] Phone: [phone]   Fax:  [phone]  Physical Therapy Treatment and Discharge  Patient Details  Name: Frances [name] MRN: [id number] Date of Birth: 07-23-1976 Referring Provider (PT): [place], [place] SUMMARY  Visits from Start of Care: 7  Current functional level related to goals / functional outcomes: See below   Remaining deficits: See below   Education / Equipment: See below   Patient agrees to discharge. Patient goals were partially met. Patient is being discharged due to meeting the stated rehab goals.  Encounter Date: 05/22/2022   PT End of Session - 05/22/22 0843     Visit Number 7    Number of Visits 12    Date for PT Re-Evaluation 05/23/22    PT Start Time 0845    PT Stop Time 0925    PT Time Calculation (min) 40 min    Activity Tolerance Patient tolerated treatment well    Behavior During Therapy WFL for tasks assessed/performed             Past Medical History:  Diagnosis Date   Anxiety    Arthritis    Back pain, chronic    Bipolar 1 disorder ([place])    Chronic fatigue syndrome    DDD (degenerative disc disease), lumbar    Depression    Diabetes mellitus without complication ([place])    Fibromyalgia    IUD 2012   Mirena   MTHFR gene mutation     Past Surgical History:  Procedure Laterality Date   COLONOSCOPY WITH PROPOFOL N/A 02/25/2017   Procedure: COLONOSCOPY WITH PROPOFOL;  Surgeon: Arta Silence, MD;  Location: [facility];  Service: Endoscopy;  Laterality: N/A;   left knee lateral release  1993   scar tisue removal  03/2005   left ankle   TONSILLECTOMY  age 46's    There were no vitals filed for this visit.   Subjective Assessment - 05/22/22 0847     Subjective "It's been come and go." Pt reports she's continued to feel it on her left side. Feels TPDN has helped.    How long can you sit  comfortably? 10 minutes    How long can you stand comfortably? 1-2 minutes    Diagnostic tests none recently    Patient Stated Goals decrease pain    Currently in Pain? Yes    Pain Score 2     Pain Location Back    Pain Orientation Lower;Left    Pain Descriptors / Indicators Sore    Pain Type Acute pain                [facility] PT Assessment - 05/22/22 0001       Assessment   Medical Diagnosis acute Lt sided low back pain without sciatica    Referring Provider (PT) [place] Planas    Onset Date/Surgical Date 04/05/22    Next MD Visit PRN    Prior Therapy yes for same issue                           [facility] Adult PT Treatment/Exercise - 05/22/22 0001       Lumbar Exercises: Stretches   Other Lumbar Stretch Exercise cat/cow straight and laterally    Other Lumbar Stretch Exercise child's pose x30 sec and then with lateral flexion x30 sec      Lumbar Exercises: Aerobic  Nustep L5 x 5 min for warm up      Lumbar Exercises: Standing   Lifting Limitations trunk diagonals x 10 bilat blue med ball    Shoulder Extension 20 reps    Theraband Level (Shoulder Extension) Level 4 (Blue)    Other Standing Lumbar Exercises pallof press blue x 10 bilat, X band step red 5 steps x 4 bilat      Lumbar Exercises: Prone   Straight Leg Raise 10 reps    Straight Leg Raises Limitations straight leg and then 1 set with bent knee      Lumbar Exercises: Quadruped   Other Quadruped Lumbar Exercises fire hydrant red tband 2x10    Other Quadruped Lumbar Exercises bird dog with red tband for UE flexion x10      Manual Therapy   Manual therapy comments skilled palpation to assess effects of dry needling    Soft tissue mobilization STM bilat lumbar paraspinals, QL                          PT Long Term Goals - 05/22/22 0917       PT LONG TERM GOAL #1   Title Pt will be independent with HEP    Baseline Reports she has not been the most consistent    Time 6     Period Weeks    Status Partially Met    Target Date 05/23/22      PT LONG TERM GOAL #2   Title Pt will improve FOTO to >= 66 to demo improved functional mobility    Baseline 54    Time 6    Period Weeks    Status Partially Met    Target Date 05/23/22      PT LONG TERM GOAL #3   Title Pt will tolerate standing x 10 minutes with pain <= 1/10    Baseline Has not noticed the pain    Time 6    Period Weeks    Status Achieved    Target Date 05/23/22                   Plan - 05/22/22 4270     Clinical Impression Statement Pt feels ready for PT d/c to her HEP. Has some continued hip weakness that could benefit from additional strengthening. Pt has otherwise met or partially met her LTGs. Pt notes that her L low back pain has continued to improve but she will still occasionally feel it.    PT Next Visit Plan check goals, d/c, update HEP    PT Home Exercise Plan [id number]    Consulted and Agree with Plan of Care Patient             Patient will benefit from skilled therapeutic intervention in order to improve the following deficits and impairments:     Visit Diagnosis: Chronic bilateral low back pain without sciatica  Muscle weakness (generalized)  Abnormal posture  Pain in right hip     Problem List Patient Active Problem List   Diagnosis Date Noted   Acute left-sided low back pain without sciatica 04/07/2022   DEGENERATIVE DISC DISEASE, LUMBAR SPINE 04/07/2022   Acute midline low back pain without sciatica 04/07/2022   Weak urine stream 03/17/2022   Proteinuria 03/17/2022   Chronic right hip pain 01/06/2022   Acute pain of right shoulder 01/06/2022   Right lateral epicondylitis 12/31/2021   ETD (Eustachian tube dysfunction), bilateral 08/23/2021  SOB (shortness of breath) on exertion 08/20/2021   Overweight (BMI 25.0-29.9) 06/18/2021   Attention deficit hyperactivity disorder (ADHD), 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   Hyperlipidemia LDL goal  <70 03/18/2021   Type 2 diabetes mellitus with hyperglycemia, without long-term current use of insulin ([place]) 03/13/2021   Seasonal allergies 12/25/2020   Bilateral leg edema 11/19/2020   Costochondritis 10/22/2020   Rib pain on right side 10/22/2020   Tachycardia 10/22/2020   Symptomatic mammary hypertrophy 09/04/2020   Xiphoid pain 08/13/2020   Sternum pain 08/13/2020   Recurrent major depressive disorder, in partial remission ([place]) 05/24/2020   Generalized anxiety disorder 05/24/2020   DDD (degenerative disc disease), cervical 03/13/2020   Ingrowing left great toenail 03/13/2020   Labral tear of hip, degenerative, right 01/11/2020   Grief reaction 11/28/2019   Toenail fungus 09/09/2019   Vasovagal response 09/09/2019   Decreased visual acuity 08/30/2018   Right corneal abrasion 08/30/2018   Elevated fasting glucose 04/08/2018   Anxiety 01/22/2018   Borderline personality disorder ([place]) 01/22/2018   Rheumatoid arthritis 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 ([place]) 12/09/2017   TMJ (temporomandibular joint syndrome) 09/26/2017   New daily persistent headache 09/26/2017   MTHFR gene mutation 09/20/2017   Bipolar I disorder ([employer]) 09/16/2017   Chronic fatigue syndrome 09/16/2017   Back pain 10/13/2008   DERMATITIS, ALLERGIC 06/05/2008   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

## 2022-05-28 ENCOUNTER — Encounter (HOSPITAL_COMMUNITY): Payer: Self-pay | Admitting: Psychiatry

## 2022-05-28 ENCOUNTER — Other Ambulatory Visit: Payer: Self-pay | Admitting: Physician Assistant

## 2022-05-28 ENCOUNTER — Telehealth (INDEPENDENT_AMBULATORY_CARE_PROVIDER_SITE_OTHER): Payer: No Payment, Other | Admitting: Psychiatry

## 2022-05-28 DIAGNOSIS — F900 Attention-deficit hyperactivity disorder, predominantly inattentive type: Secondary | ICD-10-CM | POA: Diagnosis not present

## 2022-05-28 DIAGNOSIS — E1165 Type 2 diabetes mellitus with hyperglycemia: Secondary | ICD-10-CM

## 2022-05-28 DIAGNOSIS — F319 Bipolar disorder, unspecified: Secondary | ICD-10-CM

## 2022-05-28 MED ORDER — ATOMOXETINE HCL 40 MG PO CAPS: 40.0000 mg | ORAL_CAPSULE | Freq: Every day | ORAL | 3 refills | Status: DC

## 2022-05-28 MED ORDER — BUPROPION HCL ER (XL) 300 MG PO TB24: 300.0000 mg | ORAL_TABLET | Freq: Every day | ORAL | 2 refills | Status: DC

## 2022-05-28 MED ORDER — BENZTROPINE MESYLATE 0.5 MG PO TABS: 0.5000 mg | ORAL_TABLET | Freq: Every day | ORAL | 3 refills | Status: DC

## 2022-05-28 MED ORDER — BUPROPION HCL ER (XL) 150 MG PO TB24: 150.0000 mg | ORAL_TABLET | Freq: Every morning | ORAL | 3 refills | Status: DC

## 2022-05-28 MED FILL — HYDROXYCHLOROQUINE 200 MG TABLET: ORAL | 30 days supply | Qty: 60 | Fill #6

## 2022-05-28 MED FILL — CYMBALTA 30 MG CAPSULE,DELAYED RELEASE: ORAL | 30 days supply | Qty: 90 | Fill #3

## 2022-05-28 NOTE — Progress Notes (Signed)
[facility] MD/PA/NP OP Progress Note  Virtual Visit via Video Note  I connected with Frances [name] on 05/28/22 at  9:00 AM EDT by a video enabled telemedicine application and verified that I am speaking with the correct person using two identifiers.  Location: Patient: Home Provider: Clinic   I discussed the limitations of evaluation and management by telemedicine and the availability of in person appointments. The patient expressed understanding and agreed to proceed.  I provided 30 minutes of non-face-to-face time during this encounter.           05/28/2022 9:14 AM Frances [name]  MRN:  [id number]  Chief Complaint: "I'm doing just fine"  HPI: 46 year old female seen today for follow up psychiatric evaluation. She has a psychiatric history of bipolar 1, borderline personality disorder, depression, and anxiety. She is currently managed on Abilify 15 mg nightly, Cogentin 0.5 mg daily, Wellbutrin 450 mg daily, and Cymbalta 90 mg daily (from rheumatologist). Today, patient notes that medications are somewhat effective in managing her symptoms.   Today the patient was well-groomed, pleasant, cooperative, engaged in eye contact, and engaged in conversation She informed provider that since her last visit she has been stuttering and having a lapse in memory.  Writer asked patient if she had started new medication or had any changes in life.  She denies medication changes and reports that the only life change was a friend of hers moving out.  Provider informed patient that Wellbutrin has a rare side effect of confusion.  She however notes that it is helpful in helping her concentrate and notes that she would like to try Strattera for ADHD.  She endorses forgetfulness, poor concentration, inattentiveness to mentally taxing task, and poor listening skills.   Patient reports that her anxiety and depression are well managed.  Provider conducted GAD-7 and patient 3.  Provider also conducted PHQ-9  and patient scored an 11.  She endorses adequate appetite and sleep.  She reports that she is gained 10 pounds since morning her dose of Ozempic.  Today she denies SI/HI/VAH, mania, paranoia.   Today she is agreeable to starting Strattera 40 mg to help manage symptoms of ADHD.  She will continue her other medications as prescribed.  No other concerns at this time.    Visit Diagnosis:    ICD-10-CM   1. Bipolar I disorder (HCC)  F31.9 benztropine (COGENTIN) 0.5 MG tablet    buPROPion (WELLBUTRIN XL) 300 MG 24 hr tablet    buPROPion (WELLBUTRIN XL) 150 MG 24 hr tablet    2. Attention deficit hyperactivity disorder (ADHD), predominantly inattentive type  F90.0 atomoxetine (STRATTERA) 40 MG capsule    buPROPion (WELLBUTRIN XL) 300 MG 24 hr tablet    buPROPion (WELLBUTRIN XL) 150 MG 24 hr tablet      Past Psychiatric History: Bipolar, anxiety, and depression Past Medical History:  Past Medical History:  Diagnosis Date   Anxiety    Arthritis    Back pain, chronic    Bipolar 1 disorder (HCC)    Chronic fatigue syndrome    DDD (degenerative disc disease), lumbar    Depression    Diabetes mellitus without complication ([place])    Fibromyalgia    IUD 2012   Mirena   MTHFR gene mutation     Past Surgical History:  Procedure Laterality Date   COLONOSCOPY WITH PROPOFOL N/A 02/25/2017   Procedure: COLONOSCOPY WITH PROPOFOL;  Surgeon: Arta Silence, MD;  [facility];  Service: Endoscopy;  Laterality:  N/A;   left knee lateral release  1993   scar tisue removal  03/2005   left ankle   TONSILLECTOMY  age 53's    Family Psychiatric History: Maternal aunts Bipolar disorder and uncle alcoholic  Family History:  Family History  Problem Relation Age of Onset   Diabetes Mother    Stroke Mother    Lupus Mother    Hypertension Mother    Congestive Heart Failure Mother    Mental illness Brother        Not clear what his diagnosis is--may be related to previous drug use.   Cancer  Maternal Grandmother        colon   Depression Maternal Uncle    Alcohol abuse Maternal Uncle    Diabetes Maternal Grandfather     Social History:  Social History   Socioeconomic History   Marital status: Single    Spouse name: Not on file   Number of children: 0   Years of education: Not on file   Highest education level: Bachelor's degree (e.g., BA, AB, BS)  Occupational History   Occupation: Designer, industrial/product at times  Tobacco Use   Smoking status: Never   Smokeless tobacco: Never  Vaping Use   Vaping Use: Never used  Substance and Sexual Activity   Alcohol use: No    Alcohol/week: 0.0 standard drinks   Drug use: No   Sexual activity: Yes    Birth control/protection: I.U.D.  Other Topics Concern   Not on file  Social History Narrative   Originally from [place], outside of [place].    Moved here permanently 2012.   Intermittently worked on a farm.   Lives with many cats--3 plus fosters cats.    Single, lives alone in a one story home. Rarely drinks caffeine. 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   Social Determinants of Health   Financial Resource Strain: Not on file  Food Insecurity: Not on file  Transportation Needs: Not on file  Physical Activity: Not on file  Stress: Not on file  Social Connections: Not on file    Allergies:  Allergies  Allergen Reactions   Metaxalone Hives    Metabolic Disorder Labs: Lab Results  Component Value Date   HGBA1C 5.3 03/17/2022   MPG 103 04/14/2018   No results found for: PROLACTIN Lab Results  Component Value Date   CHOL 157 01/20/2022   TRIG 124 01/20/2022   HDL 46 01/20/2022   CHOLHDL 3.4 01/20/2022   LDLCALC 89 01/20/2022   LDLCALC 73 04/14/2018   Lab Results  Component Value Date   TSH 0.866 11/21/2020   TSH 1.616 01/27/2018    Therapeutic Level Labs: No results found for: LITHIUM No results found for: VALPROATE No components found for:  CBMZ  Current  Medications: Current Outpatient Medications  Medication Sig Dispense Refill   atomoxetine (STRATTERA) 40 MG capsule Take 1 capsule (40 mg total) by mouth daily. 30 capsule 3   albuterol (VENTOLIN HFA) 108 (90 Base) MCG/ACT inhaler Take 2 puffs 15 minutes before exercise and as needed for shortness of breath. 6.7 g 1   ARIPiprazole (ABILIFY) 15 MG tablet Take 1 tablet (15 mg total) by mouth daily. 30 tablet 3   atorvastatin (LIPITOR) 20 MG tablet Take 1 tablet (20 mg total) by mouth daily. 90 tablet 3   Azelastine HCl 137 MCG/SPRAY SOLN USE 2 SPRAYS INTO ALTERNATE NOSTRILS TWICE A DAY 30 mL 2   baclofen (LIORESAL) 10 MG  tablet Take 1 tablet (10 mg total) by mouth every 8 (eight) hours as needed for muscle spasms (Pain). 60 tablet 0   benztropine (COGENTIN) 0.5 MG tablet Take 1 tablet (0.5 mg total) by mouth daily. 30 tablet 3   blood glucose meter kit and supplies KIT Dispense based on patient and insurance preference. Use up to four times daily as directed. 1 each 0   buPROPion (WELLBUTRIN XL) 150 MG 24 hr tablet Take 1 tablet (150 mg total) by mouth every morning. 30 tablet 3   buPROPion (WELLBUTRIN XL) 300 MG 24 hr tablet Take 1 tablet (300 mg total) by mouth daily. 30 tablet 2   cholecalciferol (VITAMIN D3) 25 MCG (1000 UT) tablet Take 1,000 Units by mouth daily.     DULoxetine HCl 30 MG CSDR Take 90 mg by mouth daily.     fluticasone (FLONASE) 50 MCG/ACT nasal spray USE 2 SPRAYS IN EACH NOSTRIL EVERY DAY 16 g 1   HYDROcodone-acetaminophen (NORCO/VICODIN) 5-325 MG tablet Take 1 tablet by mouth 2 (two) times daily as needed for moderate pain (back pain). 60 tablet 0   hydroxychloroquine (PLAQUENIL) 200 MG tablet Take by mouth.     levocetirizine (XYZAL) 5 MG tablet TAKE 1 TABLET BY MOUTH EVERY EVENING 90 tablet 1   metFORMIN (GLUCOPHAGE) 1000 MG tablet TAKE 1 Tablet  BY MOUTH TWICE DAILY WITH A MEAL 180 tablet 1   methocarbamol (ROBAXIN) 500 MG tablet TAKE 1 TABLET BY MOUTH THREE TIMES A DAY 90  tablet 1   montelukast (SINGULAIR) 10 MG tablet TAKE 1 Tablet BY MOUTH ONCE EVERY NIGHT AT BEDTIME 90 tablet 1   predniSONE (DELTASONE) 50 MG tablet Take one tablet for 5 days. 5 tablet 0   pregabalin (LYRICA) 75 MG capsule TAKE 1 CAPSULE BY MOUTH TWICE A DAY 60 capsule 3   Semaglutide,0.25 or 0.5MG/DOS, (OZEMPIC, 0.25 OR 0.5 MG/DOSE,) 2 MG/1.5ML SOPN Inject 0.25 mg into the skin once a week. (Patient taking differently: Inject 0.5 mg into the skin once a week.) 1.5 mL 0   No current facility-administered medications for this visit.     Musculoskeletal: Strength & Muscle Tone: within normal limits and  telehealth visit Gait & Station: normal, telehealth visit Patient leans: N/A  Psychiatric Specialty Exam: Review of Systems  There were no vitals taken for this visit.There is no height or weight on file to calculate BMI.  General Appearance: Well Groomed  Eye Contact:  Good  Speech:  Clear and Coherent and Normal Rate  Volume:  Normal  Mood:  Euthymic  Affect:  Congruent  Thought Process:  Coherent, Goal Directed and Linear  Orientation:  Full (Time, Place, and Person)  Thought Content: WDL and Logical   Suicidal Thoughts:  No  Homicidal Thoughts:  No  Memory:  Immediate;   Good Recent;   Good Remote;   Good  Judgement:  Good  Insight:  Good  Psychomotor Activity:  Normal  Concentration:  Concentration: Fair and Attention Span: Fair  Recall:  Good  Fund of Knowledge: Good  Language: Good  Akathisia:  No  Handed:  Right  AIMS (if indicated): Not done  Assets:  Communication Skills Desire for Improvement Financial Resources/Insurance Housing Social Support  ADL's:  Intact  Cognition: WNL  Sleep:  Good   Screenings: AIMS    Flowsheet Row Admission (Discharged) from 01/25/2018 in [facility]  AIMS Total Score 0      AUDIT    Flowsheet Row  Admission (Discharged) from 01/25/2018 in [facility]   Alcohol Use Disorder Identification Test Final Score (AUDIT) 0      GAD-7    Flowsheet Row Video Visit from 05/28/2022 in [facility] Video Visit from 01/13/2022 in [facility] Video Visit from 10/28/2021 in [facility] Office Visit from 09/17/2021 in [place] Video Visit from 07/25/2021 in [facility]  Total GAD-7 Score _0 [id number]    Flowsheet Row Video Visit from 05/28/2022 in [facility] Counselor from 02/21/2022 in [facility] Video Visit from 01/13/2022 in [facility] Counselor from 01/07/2022 in [facility] Office Visit from 01/06/2022 in [facility]  PHQ-2 Total Score _1 PHQ-9 Total Score _2 --      Flowsheet Row ED from 04/06/2022 in [facility] ED from 05/05/2021 in [facility] [place] from 01/30/2021 in [place] No Risk Low Risk No Risk        Assessment and Plan: Patient endorses symptoms of ADHD.  Today she is agreeable to starting Strattera 40 mg to help manage symptoms.  She will continue her other medications as prescribed.  1. Bipolar I disorder (HCC)  Continue- benztropine (COGENTIN) 0.5 MG tablet; Take 1 tablet (0.5 mg total) by mouth daily.  Dispense: 30 tablet; Refill: 3 Continue- buPROPion (WELLBUTRIN XL) 300 MG 24 hr tablet; Take 1 tablet (300 mg total) by mouth daily.  Dispense: 30 tablet; Refill: 2 Continue- buPROPion (WELLBUTRIN XL) 150 MG 24 hr tablet; Take 1 tablet (150 mg total) by mouth every morning.  Dispense: 30 tablet; Refill: 3  2. Attention deficit hyperactivity disorder (ADHD), predominantly inattentive type  Start-  atomoxetine (STRATTERA) 40 MG capsule; Take 1 capsule (40 mg total) by mouth daily.  Dispense: 30 capsule; Refill: 3 Continue- buPROPion (WELLBUTRIN XL) 300 MG 24 hr tablet; Take 1 tablet (300 mg total) by mouth daily.  Dispense: 30 tablet; Refill: 2 Continue- buPROPion (WELLBUTRIN XL) 150 MG 24 hr tablet; Take 1 tablet (150 mg total) by mouth every morning.  Dispense: 30 tablet; Refill: 3   Follow up in 3 moths Follow up with therapy   Salley Slaughter, NP 05/28/2022, 9:14 AM

## 2022-05-29 ENCOUNTER — Ambulatory Visit: Payer: Self-pay

## 2022-05-29 ENCOUNTER — Encounter: Payer: Self-pay | Admitting: Physical Medicine and Rehabilitation

## 2022-05-29 ENCOUNTER — Ambulatory Visit (INDEPENDENT_AMBULATORY_CARE_PROVIDER_SITE_OTHER): Payer: No Payment, Other | Admitting: Clinical

## 2022-05-29 ENCOUNTER — Ambulatory Visit (INDEPENDENT_AMBULATORY_CARE_PROVIDER_SITE_OTHER): Payer: Self-pay | Admitting: Physical Medicine and Rehabilitation

## 2022-05-29 VITALS — BP 93/68 | HR 105

## 2022-05-29 DIAGNOSIS — M5416 Radiculopathy, lumbar region: Secondary | ICD-10-CM

## 2022-05-29 DIAGNOSIS — F319 Bipolar disorder, unspecified: Secondary | ICD-10-CM | POA: Diagnosis not present

## 2022-05-29 MED ORDER — METHYLPREDNISOLONE ACETATE 80 MG/ML IJ SUSP
80.0000 mg | Freq: Once | INTRAMUSCULAR | Status: AC
  Administered 2022-05-29: 80 mg

## 2022-05-29 NOTE — Progress Notes (Unsigned)
Pt state lower back pain and right hip. Pt state walking, sitting and standing makes the pain worse. Pt state she takes pain meds and lay in bed to help ease her pain.  Numeric Pain Rating Scale and Functional Assessment Average Pain 4   In the last MONTH (on 0-10 scale) has pain interfered with the following?  1. General activity like being  able to carry out your everyday physical activities such as walking, climbing stairs, carrying groceries, or moving a chair?  Rating(8)   +Driver, -BT, -Dye Allergies.

## 2022-05-29 NOTE — Patient Instructions (Signed)

## 2022-06-05 ENCOUNTER — Telehealth (HOSPITAL_COMMUNITY): Payer: Self-pay

## 2022-06-05 NOTE — Telephone Encounter (Signed)
Pt would like Wellbutrin Rx moved to a different pharmacy. [facility]. She would like to move the RX because she will get it free.

## 2022-06-06 ENCOUNTER — Ambulatory Visit (INDEPENDENT_AMBULATORY_CARE_PROVIDER_SITE_OTHER): Payer: Self-pay | Admitting: Specialist

## 2022-06-06 ENCOUNTER — Encounter: Payer: Self-pay | Admitting: Specialist

## 2022-06-06 ENCOUNTER — Other Ambulatory Visit (HOSPITAL_COMMUNITY): Payer: Self-pay | Admitting: Psychiatry

## 2022-06-06 VITALS — BP 96/67 | HR 101 | Ht 67.0 in | Wt 162.0 lb

## 2022-06-06 DIAGNOSIS — M5137 Other intervertebral disc degeneration, lumbosacral region: Secondary | ICD-10-CM

## 2022-06-06 DIAGNOSIS — M4807 Spinal stenosis, lumbosacral region: Secondary | ICD-10-CM

## 2022-06-06 DIAGNOSIS — M51379 Other intervertebral disc degeneration, lumbosacral region without mention of lumbar back pain or lower extremity pain: Secondary | ICD-10-CM

## 2022-06-06 DIAGNOSIS — M47816 Spondylosis without myelopathy or radiculopathy, lumbar region: Secondary | ICD-10-CM

## 2022-06-06 DIAGNOSIS — M48062 Spinal stenosis, lumbar region with neurogenic claudication: Secondary | ICD-10-CM

## 2022-06-06 DIAGNOSIS — F319 Bipolar disorder, unspecified: Secondary | ICD-10-CM

## 2022-06-06 DIAGNOSIS — F900 Attention-deficit hyperactivity disorder, predominantly inattentive type: Secondary | ICD-10-CM

## 2022-06-06 MED ORDER — BUPROPION HCL ER (XL) 300 MG PO TB24: 300.0000 mg | ORAL_TABLET | Freq: Every day | ORAL | 3 refills | Status: DC

## 2022-06-06 MED ORDER — BUPROPION HCL ER (XL) 150 MG PO TB24: 150.0000 mg | ORAL_TABLET | Freq: Every morning | ORAL | 3 refills | Status: DC

## 2022-06-06 NOTE — Progress Notes (Signed)
Office Visit Note   Patient: Frances [name]           Date of Birth: 10-Jun-1976           MRN: [id number] Visit Date: 06/06/2022              Requested by: Donella Stade, PA-C [address] [place],  [place] [id number] PCP: Donella Stade, PA-C   Assessment & Plan: Visit Diagnoses:  1. Spondylosis without myelopathy or radiculopathy, lumbar region   2. Spinal stenosis of lumbosacral region   3. DEGENERATIVE DISC DISEASE, LUMBAR SPINE   4. Spinal stenosis of lumbar region with neurogenic claudication     Plan: Avoid bending, stooping and avoid lifting weights greater than 10 lbs. Avoid prolong standing and walking. Avoid frequent bending and stooping  No lifting greater than 10 lbs. May use ice or moist heat for pain. Weight loss is of benefit. Handicap license is approved. Dr. Romona Curls secretary/Assistant will call to arrange for epidural steroid injection    Follow-Up Instructions: No follow-ups on file.   Orders:  No orders of the defined types were placed in this encounter.  No orders of the defined types were placed in this encounter.     Procedures: No procedures performed   Clinical Data: No additional findings.   Subjective: Chief Complaint  Patient presents with   Lower Back - Pain, Follow-up    46 year old female with history of RA and lumbar deg disc disease with mild to mod stenosis within the lateral recessess L4-5 right  Greater than left . Pain with bending and stooping and lifting heavier objects. She has claudication symptoms and does tend to lean on  Cart with grocery shopping. Myelogram and post myelogram CT scan show narrowing subarticular right greater than left lateral recess.   Review of Systems   Objective: Vital Signs: BP 96/67   Pulse (!) 101   Ht '5\' 7"'$  (1.702 m)   Wt 162 lb (73.5 kg)   BMI 25.37 kg/m   Physical Exam Musculoskeletal:     Lumbar back: Negative right straight leg raise test and  negative left straight leg raise test.   Back Exam   Tenderness  The patient is experiencing tenderness in the lumbar.  Range of Motion  Extension:  abnormal  Flexion:  abnormal  Lateral bend right:  abnormal  Rotation right:  abnormal  Rotation left:  abnormal   Muscle Strength  Right Quadriceps:  5/5  Left Quadriceps:  5/5  Right Hamstrings:  5/5  Left Hamstrings:  5/5   Tests  Straight leg raise right: negative Straight leg raise left: negative  Reflexes  Patellar:  2/4 Achilles:  2/4 Babinski's sign: normal   Other  Toe walk: normal Heel walk: normal Sensation: normal Gait: normal     Specialty Comments:  No specialty comments available.  Imaging: No results found.   PMFS History: Patient Active Problem List   Diagnosis Date Noted   Acute left-sided low back pain without sciatica 04/07/2022   DEGENERATIVE DISC DISEASE, LUMBAR SPINE 04/07/2022   Acute midline low back pain without sciatica 04/07/2022   Weak urine stream 03/17/2022   Proteinuria 03/17/2022   Chronic right hip pain 01/06/2022   Acute pain of right shoulder 01/06/2022   Right lateral epicondylitis 12/31/2021   ETD (Eustachian tube dysfunction), bilateral 08/23/2021   SOB (shortness of breath) on exertion 08/20/2021   Overweight (BMI 25.0-29.9) 06/18/2021   Attention deficit  hyperactivity disorder (ADHD), 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   Hyperlipidemia LDL goal <70 03/18/2021   Type 2 diabetes mellitus with hyperglycemia, without long-term current use of insulin ([place]) 03/13/2021   Seasonal allergies 12/25/2020   Bilateral leg edema 11/19/2020   Costochondritis 10/22/2020   Rib pain on right side 10/22/2020   Tachycardia 10/22/2020   Symptomatic mammary hypertrophy 09/04/2020   Xiphoid pain 08/13/2020   Sternum pain 08/13/2020   Recurrent major depressive disorder, in partial remission ([place]) 05/24/2020   Generalized anxiety disorder 05/24/2020   DDD (degenerative disc  disease), cervical 03/13/2020   Ingrowing left great toenail 03/13/2020   Labral tear of hip, degenerative, right 01/11/2020   Grief reaction 11/28/2019   Toenail fungus 09/09/2019   Vasovagal response 09/09/2019   Decreased visual acuity 08/30/2018   Right corneal abrasion 08/30/2018   Elevated fasting glucose 04/08/2018   Anxiety 01/22/2018   Borderline personality disorder ([place]) 01/22/2018   Rheumatoid arthritis 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 ([place]) 12/09/2017   TMJ (temporomandibular joint syndrome) 09/26/2017   New daily persistent headache 09/26/2017   MTHFR gene mutation 09/20/2017   Bipolar I disorder ([place]) 09/16/2017   Chronic fatigue syndrome 09/16/2017   Back pain 10/13/2008   DERMATITIS, ALLERGIC 06/05/2008   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   DDD (degenerative disc disease), lumbar 02/10/2008   FIBROMYALGIA 02/10/2008   IRON, SERUM, ELEVATED 02/10/2008   Past Medical History:  Diagnosis Date   Anxiety    Arthritis    Back pain, chronic    Bipolar 1 disorder (HCC)    Chronic fatigue syndrome    DDD (degenerative disc disease), lumbar    Depression    Diabetes mellitus without complication ([place])    Fibromyalgia    IUD 2012   Mirena   MTHFR gene mutation     Family History  Problem Relation Age of Onset   Diabetes Mother    Stroke Mother    Lupus Mother    Hypertension Mother    Congestive Heart Failure Mother    Mental illness Brother        Not clear what his diagnosis is--may be related to previous drug use.   Cancer Maternal Grandmother        colon   Depression Maternal Uncle    Alcohol abuse Maternal Uncle    Diabetes Maternal Grandfather     Past Surgical History:  Procedure Laterality Date   COLONOSCOPY WITH PROPOFOL N/A 02/25/2017   Procedure: COLONOSCOPY WITH PROPOFOL;  Surgeon: Arta Silence, MD;  Location: [facility];  Service: Endoscopy;  Laterality: N/A;   left knee lateral release  1993   scar tisue removal   03/2005   left ankle   TONSILLECTOMY  age 49's   Social History   Occupational History   Occupation: Designer, industrial/product at times  Tobacco Use   Smoking status: Never   Smokeless tobacco: Never  Vaping Use   Vaping Use: Never used  Substance and Sexual Activity   Alcohol use: No    Alcohol/week: 0.0 standard drinks of alcohol   Drug use: No   Sexual activity: Yes    Birth control/protection: I.U.D.

## 2022-06-06 NOTE — Telephone Encounter (Signed)
Medication refilled and sent to [facility].

## 2022-06-06 NOTE — Patient Instructions (Signed)
Avoid bending, stooping and avoid lifting weights greater than 10 lbs. Avoid prolong standing and walking. Avoid frequent bending and stooping  No lifting greater than 10 lbs. May use ice or moist heat for pain. Weight loss is of benefit. Handicap license is approved.  

## 2022-06-11 ENCOUNTER — Ambulatory Visit: Payer: Self-pay | Admitting: Pharmacist

## 2022-06-11 ENCOUNTER — Encounter: Payer: Self-pay | Admitting: Physician Assistant

## 2022-06-11 DIAGNOSIS — E119 Type 2 diabetes mellitus without complications: Secondary | ICD-10-CM

## 2022-06-11 DIAGNOSIS — E785 Hyperlipidemia, unspecified: Secondary | ICD-10-CM

## 2022-06-11 NOTE — Progress Notes (Signed)
Chronic Care Management Pharmacy Note  06/11/2022 Name:  Frances [name] MRN:  [id number] DOB:  [birth year]  Summary: addressed HTN, HLD, DM. A1c doing extremely well (5.3), tolerating ozempic without issues.  BG: 90-100s. Gained 15lb since ozempic dose reduction from 0.55m weekly to 0.258mweekly.   She reports adherence with atorvastatin 204maily; our estimated fill history is inaccurate because her pharmacy sent her a large supply at once.   Recommendations/Changes made from today's visit: Continue current regimen - Recommend check lipid panel at next PCP visit to assess impact of dose increase from atorvastatin 75m44m 20mg7mlan: f/u with pharmacist in 6 months  Subjective: PatriNATHALYA [name] 46 y.46 year old female who is a primary patient of BreebDonella StadeC.  The CCM team was consulted for assistance with disease management and care coordination needs.    Engaged with patient face to face for follow up visit in response to provider referral for pharmacy case management and/or care coordination services.   Consent to Services:  The patient was given information about Chronic Care Management services, agreed to services, and gave verbal consent prior to initiation of services.  Please see initial visit note for detailed documentation.   Patient Care Team: BreebLavada MesiCP - General (Family Medicine) [place],Lona Millardas Referring Physician (Rheumatology)  Objective:  Lab Results  Component Value Date   CREATININE 0.7 03/08/2021   CREATININE 0.8 02/20/2021   CREATININE 0.89 11/21/2020    Lab Results  Component Value Date   HGBA1C 5.3 03/17/2022       Component Value Date/Time   CHOL 157 01/20/2022 0834   TRIG 124 01/20/2022 0834   HDL 46 01/20/2022 0834   CHOLHDL 3.4 01/20/2022 0834   CHOLHDL 4.6 04/14/2018 0822   LDLCALC 89 01/20/2022 0834   LDLCALC 73 04/14/2018 0822       Latest Ref Rng & Units 03/08/2021   12:00 AM  02/20/2021   12:00 AM 11/21/2020    9:33 AM  Hepatic Function  Total Protein 6.0 - 8.5 g/dL   6.8   Albumin 3.5 - 5.0 3.6     4.4     4.4   AST 13 - 35 37     36     44   ALT 7 - 35 58     45     51   Alk Phosphatase 25 - 125 120     57     97   Total Bilirubin 0.0 - 1.2 mg/dL   0.6      This result is from an external source.    Lab Results  Component Value Date/Time   TSH 0.866 11/21/2020 09:33 AM   TSH 1.616 01/27/2018 07:35 AM   TSH 0.967 09/14/2012 02:20 PM       Latest Ref Rng & Units 03/08/2021   12:00 AM 02/20/2021   12:00 AM 11/21/2020    9:33 AM  CBC  WBC  6.6     6.7     5.8   Hemoglobin 12.0 - 16.0 13.9     14.4     14.3   Hematocrit 36 - 46 40     41     40.7   Platelets 150 - 399 252     220     272      This result is from an external source.    No results found for: "VD25OH"  Clinical ASCVD: No  The 10-year ASCVD risk score (Arnett DK, et al., 2019) is: 1%   Values used to calculate the score:     Age: 65 years     Sex: Female     Is Non-Hispanic African American: No     Diabetic: Yes     Tobacco smoker: No     Systolic Blood Pressure: 96 mmHg     Is BP treated: No     HDL Cholesterol: 46 mg/dL     Total Cholesterol: 157 mg/dL     Social History   Tobacco Use  Smoking Status Never  Smokeless Tobacco Never   BP Readings from Last 3 Encounters:  06/06/22 96/67  05/29/22 93/68  04/24/22 107/75   Pulse Readings from Last 3 Encounters:  06/06/22 (!) 101  05/29/22 (!) 105  04/24/22 (!) 106   Wt Readings from Last 3 Encounters:  06/06/22 162 lb (73.5 kg)  04/24/22 162 lb (73.5 kg)  04/07/22 162 lb (73.5 kg)    Assessment: Review of patient past medical history, allergies, medications, health status, including review of consultants reports, laboratory and other test data, was performed as part of comprehensive evaluation and provision of chronic care management services.   SDOH:  (Social Determinants of Health) assessments and interventions  performed:    CCM Care Plan  Allergies  Allergen Reactions   Metaxalone Hives    Medications Reviewed Today     Reviewed by Darius Bump, [facility] (Pharmacist) on 06/11/22 at 1414  Med List Status: <None>   Medication Order Taking? Sig Documenting Provider Last Dose Status Informant  albuterol (VENTOLIN HFA) 108 (90 Base) MCG/ACT inhaler [id number] Yes Take 2 puffs 15 minutes before exercise and as needed for shortness of breath. Donella Stade, PA-C Taking Active   ARIPiprazole (ABILIFY) 15 MG tablet [id number] Yes Take 1 tablet (15 mg total) by mouth daily. Hampton Abbot, MD Taking Active   atomoxetine (STRATTERA) 40 MG capsule [id number] Yes Take 1 capsule (40 mg total) by mouth daily. Salley Slaughter, NP Taking Active   atorvastatin (LIPITOR) 20 MG tablet [id number] Yes Take 1 tablet (20 mg total) by mouth daily. Donella Stade, PA-C Taking Active   Azelastine HCl 137 MCG/SPRAY SOLN [id number] Yes USE 2 SPRAYS INTO ALTERNATE NOSTRILS TWICE A DAY Breeback, Jade L, PA-C Taking Active   baclofen (LIORESAL) 10 MG tablet [id number] Yes Take 1 tablet (10 mg total) by mouth every 8 (eight) hours as needed for muscle spasms (Pain). Magnus Sinning, MD Taking Active   benztropine (COGENTIN) 0.5 MG tablet [id number] Yes Take 1 tablet (0.5 mg total) by mouth daily. Salley Slaughter, NP Taking Active   blood glucose meter kit and supplies KIT [id number] Yes Dispense based on patient and insurance preference. Use up to four times daily as directed. Donella Stade, PA-C Taking Active   buPROPion (WELLBUTRIN XL) 150 MG 24 hr tablet [id number] Yes Take 1 tablet (150 mg total) by mouth every morning. Salley Slaughter, NP Taking Active   buPROPion (WELLBUTRIN XL) 300 MG 24 hr tablet [id number] Yes Take 1 tablet (300 mg total) by mouth daily. Salley Slaughter, NP Taking Active   cholecalciferol (VITAMIN D3) 25 MCG (1000 UT) tablet [id number] Yes Take 1,000 Units by mouth daily. [provider] Taking Active   DULoxetine HCl 30 MG CSDR [id number] Yes Take 90 mg by mouth daily. [provider] Taking Active   fluticasone (FLONASE) 50 MCG/ACT nasal spray [id number] Yes  USE 2 SPRAYS IN EACH NOSTRIL EVERY DAY Breeback, Jade L, PA-C Taking Active   HYDROcodone-acetaminophen (NORCO/VICODIN) 5-325 MG tablet [id number] Yes Take 1 tablet by mouth 2 (two) times daily as needed for moderate pain (back pain). Donella Stade, PA-C Taking Active   hydroxychloroquine (PLAQUENIL) 200 MG tablet [id number] Yes Take 200 mg by mouth 2 (two) times daily. [provider] Taking Active   levocetirizine (XYZAL) 5 MG tablet [id number] Yes TAKE 1 TABLET BY MOUTH EVERY EVENING Breeback, Jade L, PA-C Taking Active   metFORMIN (GLUCOPHAGE) 1000 MG tablet [id number] Yes TAKE 1 Tablet  BY MOUTH TWICE DAILY WITH A MEAL Breeback, Jade L, PA-C Taking Active   methocarbamol (ROBAXIN) 500 MG tablet [id number] Yes TAKE 1 TABLET BY MOUTH THREE TIMES A DAY Breeback, Jade L, PA-C Taking Active   methylPREDNISolone acetate (DEPO-MEDROL) injection 80 mg [id number]   Magnus Sinning, MD  Active   montelukast (SINGULAIR) 10 MG tablet [id number] Yes TAKE 1 Tablet BY MOUTH ONCE EVERY NIGHT AT BEDTIME Breeback, Royetta Car, PA-C Taking Active   pregabalin (LYRICA) 75 MG capsule [id number] Yes TAKE 1 CAPSULE BY MOUTH TWICE A DAY Silverio Decamp, MD Taking Active   Semaglutide,0.25 or 0.5MG /DOS, (OZEMPIC, 0.25 OR 0.5 MG/DOSE,) 2 MG/1.5ML SOPN [id number] Yes Inject 0.25 mg into the skin once a week.  Patient taking differently: Inject 0.5 mg into the skin once a week.   Donella Stade, PA-C Taking Active            Med Note Dorene Ar Jun 11, 2022  2:13 PM) Taking 0.25mg  weekly as of 06/11/22 review  Med List Note [name], CPhT 02/20/17 1343): Pt uses [facility] med assist             Patient Active Problem List   Diagnosis Date Noted   Acute left-sided low back pain without sciatica  04/07/2022   DEGENERATIVE [place] DISEASE, LUMBAR SPINE 04/07/2022   Acute midline low back pain without sciatica 04/07/2022   Weak urine stream 03/17/2022   Proteinuria 03/17/2022   Chronic right hip pain 01/06/2022   Acute pain of right shoulder 01/06/2022   Right lateral epicondylitis 12/31/2021   ETD (Eustachian tube dysfunction), bilateral 08/23/2021   SOB (shortness of breath) on exertion 08/20/2021   Overweight (BMI 25.0-29.9) 06/18/2021   Attention deficit hyperactivity disorder (ADHD), 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   Hyperlipidemia LDL goal <70 03/18/2021   Type 2 diabetes mellitus with hyperglycemia, without long-term current use of insulin ([place]) 03/13/2021   Seasonal allergies 12/25/2020   Bilateral leg edema 11/19/2020   Costochondritis 10/22/2020   Rib pain on right side 10/22/2020   Tachycardia 10/22/2020   Symptomatic mammary hypertrophy 09/04/2020   Xiphoid pain 08/13/2020   Sternum pain 08/13/2020   Recurrent major depressive disorder, in partial remission ([place]) 05/24/2020   Generalized anxiety disorder 05/24/2020   DDD (degenerative disc disease), cervical 03/13/2020   Ingrowing left great toenail 03/13/2020   Labral tear of hip, degenerative, right 01/11/2020   Grief reaction 11/28/2019   Toenail fungus 09/09/2019   Vasovagal response 09/09/2019   Decreased visual acuity 08/30/2018   Right corneal abrasion 08/30/2018   Elevated fasting glucose 04/08/2018   Anxiety 01/22/2018   Borderline personality disorder ([place]) 01/22/2018   Rheumatoid arthritis 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 ([place]) 12/09/2017   TMJ (temporomandibular joint syndrome) 09/26/2017   New daily persistent headache 09/26/2017   MTHFR gene mutation 09/20/2017   Bipolar I disorder (  [place]) 09/16/2017   Chronic fatigue syndrome 09/16/2017   Back pain 10/13/2008   DERMATITIS, ALLERGIC 06/05/2008   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   DDD (degenerative disc disease),  lumbar 02/10/2008   FIBROMYALGIA 02/10/2008   IRON, SERUM, ELEVATED 02/10/2008    Immunization History  Administered Date(s) Administered   Influenza Whole 08/08/2011   Influenza,inj,Quad PF,6+ Mos 10/13/2018, 09/09/2019, 08/13/2020, 09/11/2021   Influenza-Unspecified 03/04/2016, 09/09/2019, 08/13/2020   Moderna Sars-Covid-2 Vaccination 10/02/2020, 10/30/2020, 04/16/2021   Pneumococcal Conjugate-13 04/07/2018   Pneumococcal Polysaccharide-23 12/09/2018   Td 02/19/2006   Tdap 02/04/2018    Conditions to be addressed/monitored: HTN, HLD, and DMII  Care Plan : Medication Management  Updates made by Darius Bump, [place] since 06/11/2022 12:00 AM     Problem: DM, HTN, HLD      Long-Range Goal: Disease Progression Prevention   Start Date: 05/30/2021  Recent Progress: On track  Priority: High  Note:   Current Barriers:  None at present  Pharmacist Clinical Goal(s):  Over the next 180 days, patient will maintain control of diabetes as evidenced by medication fill history & lab values through collaboration with PharmD and provider.   Interventions: 1:1 collaboration with Donella Stade, PA-C regarding development and update of comprehensive plan of care as evidenced by provider attestation and co-signature Inter-disciplinary care team collaboration (see longitudinal plan of care) Comprehensive medication review performed; medication list updated in electronic medical record  Diabetes:  Uncontrolled:  current treatment:Metformin 1g BID, ozempic 0.53m weekly;  Current glucose readings: 100s-110s fasting, hasn't been checking postprandial (previously 140s)  Denies hypoglycemic/hyperglycemic symptoms  Current meal patterns: B: fiber one + atkins, L: soft pretzel, D: yogurt, protein drinks, Snacks: peanuts, protein bars, Drink:  water (+ flavoring)   Current exercise: taking care of horses, riding  Previously educated on new start ozempic, how to administer, storage for pens,  needle/sharps disposal, and expected side effects as well as dose titration Recommended continue current medications,   Hypertension:  Controlled; current treatment: previously lisinopril 534mbut was stopped d/t low BP;  Current home readings: SBP 100-110s, doing well  Denies hypotensive/hypertensive symptoms  Recommended continue current regimen    Hyperlipidemia:  Uncontrolled; current treatment:atorvastatin 2066m  Recommended continue current regimen, obtain fasting lipid panel at next visit   Patient Goals/Self-Care Activities Over the next 180 days, patient will:  take medications as prescribed and check glucose in AM prior to eating, document, and provide at future appointments  Follow Up Plan: Face to Face appointment with care management team member scheduled for: 6 months       Medication Assistance:  Ozempic obtained through [employer] assistance program.  Enrollment ends 2022  Patient's preferred pharmacy [facility]:  [facility]: [phone]: [phone] [place]: [phone]: [phone] M. [address] [place]one: [phone]: [phone] Follow Up:  Patient agrees to Care Plan and Follow-up.  Plan: Face to Face appointment with care management team member scheduled for: 6 months  KeeLarinda ButteryharmD Clinical Pharmacist [phone]

## 2022-06-11 NOTE — Patient Instructions (Signed)
Visit Information  Thank you for taking time to visit with me today. Please don't hesitate to contact me if I can be of assistance to you before our next scheduled telephone appointment.  Following are the goals we discussed today:  Patient Goals/Self-Care Activities Over the next 180 days, patient will:  take medications as prescribed and check glucose in AM prior to eating, document, and provide at future appointments  Follow Up Plan: Face to Face appointment with care management team member scheduled for: 6 months  Please call the care guide team at [phone] if you need to cancel or reschedule your appointment.   Patient verbalizes understanding of instructions and care plan provided today and agrees to view in [place]. Active MyChart status and patient understanding of how to access instructions and care plan via MyChart confirmed with patient.     Darius Bump

## 2022-06-16 ENCOUNTER — Encounter: Payer: Self-pay | Admitting: Physician Assistant

## 2022-06-16 ENCOUNTER — Telehealth (HOSPITAL_COMMUNITY): Payer: Self-pay | Admitting: *Deleted

## 2022-06-16 ENCOUNTER — Emergency Department: Admit: 2022-06-16 | Discharge status: Absent without leave | Payer: Self-pay

## 2022-06-16 ENCOUNTER — Emergency Department (INDEPENDENT_AMBULATORY_CARE_PROVIDER_SITE_OTHER)
Admission: EM | Admit: 2022-06-16 | Discharge: 2022-06-16 | Disposition: A | Discharge status: Home / Self Care | Payer: No Typology Code available for payment source | Source: Home / Self Care

## 2022-06-16 DIAGNOSIS — R21 Rash and other nonspecific skin eruption: Secondary | ICD-10-CM

## 2022-06-16 MED ORDER — METHYLPREDNISOLONE ACETATE 80 MG/ML IJ SUSP
80.0000 mg | Freq: Once | INTRAMUSCULAR | Status: AC
  Administered 2022-06-16: 80 mg via INTRAMUSCULAR

## 2022-06-16 MED ORDER — METHYLPREDNISOLONE 4 MG PO TBPK: ORAL_TABLET | ORAL | 0 refills | Status: DC

## 2022-06-16 NOTE — ED Triage Notes (Addendum)
Pt presents to Urgent Care with c/o rash to bilateral arms and legs x 2-3 days. Has been using Triamcinolone cream without improvement. Has not been near poison oak/ivy and no new hygiene products.

## 2022-06-18 ENCOUNTER — Ambulatory Visit (INDEPENDENT_AMBULATORY_CARE_PROVIDER_SITE_OTHER): Payer: No Typology Code available for payment source | Admitting: Physician Assistant

## 2022-06-18 ENCOUNTER — Encounter: Payer: Self-pay | Admitting: Physician Assistant

## 2022-06-18 VITALS — BP 114/76 | HR 104 | Ht 67.0 in | Wt 163.0 lb

## 2022-06-18 DIAGNOSIS — J302 Other seasonal allergic rhinitis: Secondary | ICD-10-CM

## 2022-06-18 DIAGNOSIS — R61 Generalized hyperhidrosis: Secondary | ICD-10-CM | POA: Insufficient documentation

## 2022-06-18 DIAGNOSIS — J3489 Other specified disorders of nose and nasal sinuses: Secondary | ICD-10-CM

## 2022-06-18 DIAGNOSIS — R0989 Other specified symptoms and signs involving the circulatory and respiratory systems: Secondary | ICD-10-CM

## 2022-06-18 DIAGNOSIS — E119 Type 2 diabetes mellitus without complications: Secondary | ICD-10-CM

## 2022-06-18 DIAGNOSIS — L247 Irritant contact dermatitis due to plants, except food: Secondary | ICD-10-CM | POA: Insufficient documentation

## 2022-06-18 DIAGNOSIS — L600 Ingrowing nail: Secondary | ICD-10-CM

## 2022-06-18 LAB — POCT GLYCOSYLATED HEMOGLOBIN (HGB A1C): Hemoglobin A1C: 5.1 % (ref 4.0–5.6)

## 2022-06-18 MED ORDER — LEVOCETIRIZINE DIHYDROCHLORIDE 5 MG PO TABS: 5.0000 mg | ORAL_TABLET | Freq: Every evening | ORAL | 1 refills | Status: DC

## 2022-06-18 MED ORDER — CLOBETASOL PROPIONATE 0.05 % EX CREA: 1.0000 | TOPICAL_CREAM | Freq: Two times a day (BID) | CUTANEOUS | 0 refills | Status: DC

## 2022-06-18 MED ORDER — OZEMPIC (0.25 OR 0.5 MG/DOSE) 2 MG/1.5ML ~~LOC~~ SOPN: 0.5000 mg | PEN_INJECTOR | SUBCUTANEOUS | 0 refills | Status: DC

## 2022-06-18 NOTE — Progress Notes (Signed)
Established Patient Office Visit  Subjective   Patient ID: DEB LOUDIN, female    DOB: 1976-03-18  Age: 47 y.o. MRN: [id number]  Chief Complaint  Patient presents with   Follow-up   Diabetes    HPI  Frances [name] is a 46 year old female with a history of T2DM, Fibromyalgia, Rheumatoid Arthritis, Ingrown Toenail, Bipolar I, GAD presenting for follow up of T2DM. She reports that she has been doing well and is ready to increase her dose of Ozempic back to 0.5 mg. She has not been checking her blood sugar regularly, but denies any serious hyper/hypoglycemic events, chest pain, lower extremity swelling. She has noticed some weight gain.   She was recently seen on 06/16/22 at Urgent Care for evaluation of a rash. The rash had been present for about 2 days prior to initial presentation. The rash was pruritic, erythematous, and blanchable. She was given Depo-medrol IM and Prednisone taper PO. The rash has not improved after 2 days of the prednisone. Denies any insect bites, changes in topicals, new detergents or soaps, joint pain, flu-like-symptoms. 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   Patient is also reporting intermittent toenail pain in bilateral 1st toes. She states that the pain is worse after long periods of standing. Alleviated with rest. She states that it does not feel like an ingrown toenail. Denies any erythema, purulence, swelling, significant pain.  Additionally, she is experiencing intermittent night sweats. She missed her menstrual cycle about 3 months ago, and believes she is going to miss this month's cycle as well. Denies any fevers, unintentional weight loss, lymphadenopathy, hot flashes.   .. 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   DERMATITIS, ALLERGIC 06/05/2008   DDD (degenerative disc disease), lumbar 02/10/2008   Back pain 10/13/2008   FIBROMYALGIA 02/10/2008   IRON, SERUM,  ELEVATED 02/10/2008   Bipolar I disorder ([place]) 09/16/2017   Chronic fatigue syndrome 09/16/2017   MTHFR gene mutation 09/20/2017   TMJ (temporomandibular joint syndrome) 09/26/2017   New daily persistent headache 09/26/2017   Rheumatoid arthritis 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 ([place]) 12/09/2017   Anxiety 01/22/2018   Borderline personality disorder ([place]) 01/22/2018   Elevated fasting glucose 04/08/2018   Decreased visual acuity 08/30/2018   Right corneal abrasion 08/30/2018   Toenail fungus 09/09/2019   Vasovagal response 09/09/2019   Grief reaction 11/28/2019   Labral tear of hip, degenerative, right 01/11/2020   DDD (degenerative disc disease), cervical 03/13/2020   Ingrown toenail of both feet 03/13/2020   Recurrent major depressive disorder, in partial remission ([place]) 05/24/2020   Generalized anxiety disorder 05/24/2020   Xiphoid pain 08/13/2020   Sternum pain 08/13/2020   Symptomatic mammary hypertrophy 09/04/2020   Costochondritis 10/22/2020   Rib pain on right side 10/22/2020   Tachycardia 10/22/2020   Bilateral leg edema 11/19/2020   Seasonal allergies 12/25/2020   Type 2 diabetes mellitus with hyperglycemia, without long-term current use of insulin ([place]) 03/13/2021   Hyperlipidemia LDL goal <70 03/18/2021   Attention deficit hyperactivity disorder (ADHD), 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   Overweight (BMI 25.0-29.9) 06/18/2021   SOB (shortness of breath) on exertion 08/20/2021   ETD (Eustachian tube dysfunction), bilateral 08/23/2021   Right lateral epicondylitis 12/31/2021   Chronic right hip pain 01/06/2022   Acute pain of right shoulder 01/06/2022   Weak urine stream 03/17/2022   Proteinuria 03/17/2022   Acute left-sided  low back pain without sciatica 04/07/2022   DEGENERATIVE DISC DISEASE, LUMBAR SPINE 04/07/2022   Acute midline low back pain without sciatica 04/07/2022   Resolved Ambulatory Problems    Diagnosis Date Noted   FEVER,  RECURRENT 04/02/2009   Hypothyroidism 02/10/2008   SINUSITIS- ACUTE-NOS 03/24/2008   URI 01/17/2009   ANKLE PAIN, BILATERAL 07/28/2008   FATIGUE 05/05/2008   OPEN WOUND FT NO TOE ALONE WITHOUT MENTION COMP 03/28/2009   DYSPNEA 10/15/2011   Fibromyalgia 09/16/2017   Depression    Bipolar 1 disorder, depressed, severe ([place]) 01/25/2018   Chronic right-sided low back pain without sciatica 01/11/2020   Macromastia 06/29/2020   Class 1 obesity due to excess calories without serious comorbidity with body mass index (BMI) of 32.0 to 32.9 in adult 08/13/2020   Hepatomegaly 10/22/2020   Chest congestion 11/20/2020   Sinus drainage 11/20/2020   Ear popping, bilateral 08/20/2021   Past Medical History:  Diagnosis Date   Arthritis    Back pain, chronic    Bipolar 1 disorder ([place])    Diabetes mellitus without complication ([place])    IUD 2012      Review of Systems  Constitutional:  Positive for diaphoresis (night).  Skin:  Positive for itching and rash.  All other systems reviewed and are negative.     Objective:     BP 114/76   Pulse (!) 104   Ht '5\' 7"'$  (1.702 m)   Wt 163 lb (73.9 kg)   LMP 04/25/2022 (Approximate) Comment: suspects she is perimenopausal  SpO2 97%   BMI 25.53 kg/m  BP Readings from Last 3 Encounters:  06/18/22 114/76  06/16/22 101/70  06/06/22 96/67   Wt Readings from Last 3 Encounters:  06/18/22 163 lb (73.9 kg)  06/16/22 162 lb (73.5 kg)  06/06/22 162 lb (73.5 kg)      Physical Exam Vitals reviewed.  Constitutional:      General: She is not in acute distress.    Appearance: Normal appearance. She is not ill-appearing.  Eyes:     General: No scleral icterus.    Extraocular Movements: Extraocular movements intact.     Conjunctiva/sclera: Conjunctivae normal.  Cardiovascular:     Rate and Rhythm: Regular rhythm. Tachycardia present.     Pulses: Normal pulses.     Heart sounds: Normal heart sounds.  Pulmonary:     Effort: Pulmonary effort is  normal.     Breath sounds: Normal breath sounds.  Musculoskeletal:     Right lower leg: No edema.     Left lower leg: No edema.  Feet:     Comments: Bilateral first toes without erythema, swelling, purulence, pain. Toenails have notable deformity indicative of prior partial toenail excision due to past ingrown toenails.  Skin:    General: Skin is warm and dry.     Findings: Rash present.     Comments: The rash is maculopapular, erythematous, pruritic, blanchable, and in random distributions on the body.    Neurological:     Mental Status: She is alert and oriented to person, place, and time.  Psychiatric:        Mood and Affect: Mood normal.        Behavior: Behavior normal.      Results for orders placed or performed in visit on 06/18/22  POCT glycosylated hemoglobin (Hb A1C)  Result Value Ref Range   Hemoglobin A1C 5.1 4.0 - 5.6 %   HbA1c POC (<> result, manual entry)     HbA1c,  POC (prediabetic range)     HbA1c, POC (controlled diabetic range)        Assessment & Plan:   Frances [name] was seen today for follow-up and diabetes.  Diagnoses and all orders for this visit:  Type 2 diabetes mellitus without complication, without long-term current use of insulin (HCC) -     POCT glycosylated hemoglobin (Hb A1C) -     Semaglutide,0.25 or 0.'5MG'$ /DOS, (OZEMPIC, 0.25 OR 0.5 MG/DOSE,) 2 MG/1.5ML SOPN; Inject 0.5 mg into the skin once a week.  Sinus drainage -     levocetirizine (XYZAL) 5 MG tablet; Take 1 tablet (5 mg total) by mouth every evening.  Chest congestion -     levocetirizine (XYZAL) 5 MG tablet; Take 1 tablet (5 mg total) by mouth every evening.  Seasonal allergies -     levocetirizine (XYZAL) 5 MG tablet; Take 1 tablet (5 mg total) by mouth every evening.  Irritant contact dermatitis due to plants, except food -     clobetasol cream (TEMOVATE) 0.05 %; Apply 1 Application topically 2 (two) times daily.  Ingrown toenail of both feet   A1C to goal Continue  medications Increased ozempic to .'5mg'$  weekly to help with weight control On statin BP to goal Covid/flu/pneumonia vaccine UTD.  Refilled allergy medications Rash likely from hay bells  Finish medrol dose pack Added topical steroid Ice pack for itching Follow up as needed or if symptoms worsen  Discussed ingrown toenails and how to prevent Not symptomatic enough to remove at this time

## 2022-06-18 NOTE — Patient Instructions (Signed)
Contact Dermatitis Dermatitis is redness, soreness, and swelling (inflammation) of the skin. Contact dermatitis is a reaction to certain substances that touch the skin. Many different substances can cause contact dermatitis. There are two types of contact dermatitis: Irritant contact dermatitis. This type is caused by something that irritates your skin, such as having dry hands from washing them too often with soap. This type does not require previous exposure to the substance for a reaction to occur. This is the most common type. Allergic contact dermatitis. This type is caused by a substance that you are allergic to, such as poison ivy. This type occurs when you have been exposed to the substance (allergen) and develop a sensitivity to it. Dermatitis may develop soon after your first exposure to the allergen, or it may not develop until the next time you are exposed and every time thereafter. What are the causes? Irritant contact dermatitis is most commonly caused by exposure to: Makeup. Soaps. Detergents. Bleaches. Acids. Metal salts, such as nickel. Allergic contact dermatitis is most commonly caused by exposure to: Poisonous plants. Chemicals. Jewelry. Latex. Medicines. Preservatives in products, such as clothing. What increases the risk? You are more likely to develop this condition if you have: A job that exposes you to irritants or allergens. Certain medical conditions, such as asthma or eczema. What are the signs or symptoms? Symptoms of this condition may occur on your body anywhere the irritant has touched you or is touched by you. Symptoms include: Dryness or flaking. Redness. Cracks. Itching. Pain or a burning feeling. Blisters. Drainage of small amounts of blood or clear fluid from skin cracks. With allergic contact dermatitis, there may also be swelling in areas such as the eyelids, mouth, or genitals. How is this diagnosed? This condition is diagnosed with a medical  history and physical exam. A patch skin test may be performed to help determine the cause. If the condition is related to your job, you may need to see an occupational medicine specialist. How is this treated? This condition is treated by checking for the cause of the reaction and protecting your skin from further contact. Treatment may also include: Steroid creams or ointments. Oral steroid medicines may be needed in more severe cases. Antibiotic medicines or antibacterial ointments, if a skin infection is present. Antihistamine lotion or an antihistamine taken by mouth to ease itching. A bandage (dressing). Follow these instructions at home: Skin care Moisturize your skin as needed. Apply cool compresses to the affected areas. Try applying baking soda paste to your skin. Stir water into baking soda until it reaches a paste-like consistency. Do not scratch your skin, and avoid friction to the affected area. Avoid the use of soaps, perfumes, and dyes. Medicines Take or apply over-the-counter and prescription medicines only as told by your health care provider. If you were prescribed an antibiotic medicine, take or apply the antibiotic as told by your health care provider. Do not stop using the antibiotic even if your condition improves. Bathing Try taking a bath with: Epsom salts. Follow the instructions on the packaging. You can get these at your local pharmacy or grocery store. Baking soda. Pour a small amount into the bath as directed by your health care provider. Colloidal oatmeal. Follow the instructions on the packaging. You can get this at your local pharmacy or grocery store. Bathe less frequently, such as every other day. Bathe in lukewarm water. Avoid using hot water. Bandage care If you were given a bandage (dressing), change it as told   by your health care provider. Wash your hands with soap and water before and after you change your dressing. If soap and water are not  available, use hand sanitizer. General instructions Avoid the substance that caused your reaction. If you do not know what caused it, keep a journal to try to track what caused it. Write down: What you eat. What cosmetic products you use. What you drink. What you wear in the affected area. This includes jewelry. Check the affected areas every day for signs of infection. Check for: More redness, swelling, or pain. More fluid or blood. Warmth. Pus or a bad smell. Keep all follow-up visits as told by your health care provider. This is important. Contact a health care provider if: Your condition does not improve with treatment. Your condition gets worse. You have signs of infection such as swelling, tenderness, redness, soreness, or warmth in the affected area. You have a fever. You have new symptoms. Get help right away if: You have a severe headache, neck pain, or neck stiffness. You vomit. You feel very sleepy. You notice red streaks coming from the affected area. Your bone or joint underneath the affected area becomes painful after the skin has healed. The affected area turns darker. You have difficulty breathing. Summary Dermatitis is redness, soreness, and swelling (inflammation) of the skin. Contact dermatitis is a reaction to certain substances that touch the skin. Symptoms of this condition may occur on your body anywhere the irritant has touched you or is touched by you. This condition is treated by figuring out what caused the reaction and protecting your skin from further contact. Treatment may also include medicines and skin care. Avoid the substance that caused your reaction. If you do not know what caused it, keep a journal to try to track what caused it. Contact a health care provider if your condition gets worse or you have signs of infection such as swelling, tenderness, redness, soreness, or warmth in the affected area. This information is not intended to replace  advice given to you by your health care provider. Make sure you discuss any questions you have with your health care provider. Document Revised: 09/23/2021 Document Reviewed: 09/23/2021 Elsevier Patient Education  2023 Elsevier Inc.  

## 2022-06-19 ENCOUNTER — Telehealth (HOSPITAL_COMMUNITY): Payer: No Payment, Other | Admitting: Psychiatry

## 2022-06-23 NOTE — Progress Notes (Signed)
THERAPIST PROGRESS NOTE Virtual Visit via Video Note  I connected with Para March on 05/29/2022 at  2:00 PM EDT by a video enabled telemedicine application and verified that I am speaking with the correct person using two identifiers.  Location: Patient: home Provider: office   I discussed the limitations of evaluation and management by telemedicine and the availability of in person appointments. The patient expressed understanding and agreed to proceed.   Follow Up Instructions: I discussed the assessment and treatment plan with the patient. The patient was provided an opportunity to ask questions and all were answered. The patient agreed with the plan and demonstrated an understanding of the instructions.   The patient was advised to call back or seek an in-person evaluation if the symptoms worsen or if the condition fails to improve as anticipated.    Session Time: 35 minutes  Participation Level: Active  Behavioral Response: CasualAlertEuthymic  Type of Therapy: Individual Therapy  Treatment Goals addressed: Client will identify 3 cognitive patterns and beliefs that support depression  ProgressTowards Goals: Progressing  Interventions: CBT and Supportive  Summary:  ZERIYAH WAIN is a 46 y.o. female who presents for the scheduled session oriented x5, appropriately dressed, and friendly.  Client denied hallucinations and delusions. Client reported today she is doing okay.  Client reported she has met with her physical doctors related to her back.  Client reported this morning she received her first back injection.  Client reported she was see how this new treatment may be able to help her.  Client reported since her last appointment she found an old letter that she wrote about her dad processing her thoughts and feelings from working with previous therapist.  Client reported she was delivered in 2018.  Client reported she made points of important thoughts such as  he walked out and start new family.  Client also made note of how his actions caused her to feel unworthy, unloved, and like a loser. Client reported thinking "who would want me if my dad didn't".  Client reported at 1 point having questionable emotions towards her mother because her mother defended her father's actions.  Evidence of progress towards goal:  Client completed previous session homework and identified 2 negative beliefs that originate from childhood experience.   Suicidal/Homicidal: Nowithout intent/plan  Therapist Response:  Therapist began the appointment asking the client how she has been doing since last seen. Therapist used CBT to engage using active listening and positive emotional support. Therapist used CBT to ask the client about her emotions towards her chronic pain and interventions being used to help. Therapist used CBT to engage with the client about negative beliefs that stem from childhood experiences. Therapist used CBT to ask the client open-ended questions about her willingness to reframe negative beliefs. Therapist used CBT ask the client to identify her progress with frequency of use with coping skills with continued practice in her daily activity.    Therapist assigned client homework to write out her negative beliefs and reframe her negative self evaluations. Client was scheduled for next appointment.    Plan: Return again in 5 weeks.  Diagnosis: Bipolar 1 disorder  Collaboration of Care: Patient refused AEB none requested by the client.  Patient/Guardian was advised Release of Information must be obtained prior to any record release in order to collaborate their care with an outside provider. Patient/Guardian was advised if they have not already done so to contact the registration department to sign all necessary forms in  order for [place] to release information regarding their care.   Consent: Patient/Guardian gives verbal consent for treatment and assignment  of benefits for services provided during this visit. Patient/Guardian expressed understanding and agreed to proceed.   [place], LCSW 05/29/2022

## 2022-06-23 NOTE — Plan of Care (Signed)
  Problem: Depression CCP Problem  1  Goal: LTG: [name] WILL SCORE LESS THAN 10 ON THE PATIENT HEALTH QUESTIONNAIRE (PHQ-9) Outcome: Progressing Goal: STG: [name] WILL IDENTIFY 3 COGNITIVE PATTERNS AND BELIEFS THAT SUPPORT DEPRESSION Outcome: Progressing   

## 2022-06-26 ENCOUNTER — Telehealth: Payer: Self-pay | Admitting: Physician Assistant

## 2022-06-26 NOTE — Telephone Encounter (Signed)
Patient dropped off forms for you, placed in your box outside your door. AMUCK

## 2022-07-01 DIAGNOSIS — M0600 Rheumatoid arthritis without rheumatoid factor, unspecified site: Principal | ICD-10-CM

## 2022-07-01 MED ORDER — HYDROXYCHLOROQUINE 200 MG TABLET
ORAL_TABLET | Freq: Two times a day (BID) | ORAL | 6 refills | 30 days | Status: CP
Start: 2022-07-01 — End: 2023-07-01
  Filled 2022-07-03: qty 60, 30d supply, fill #0

## 2022-07-01 MED ORDER — DICLOFENAC SODIUM 75 MG TABLET,DELAYED RELEASE
ORAL_TABLET | Freq: Two times a day (BID) | ORAL | 2 refills | 30 days | Status: CP
Start: 2022-07-01 — End: 2022-09-29
  Filled 2022-07-03: qty 60, 30d supply, fill #0

## 2022-07-03 MED FILL — CYMBALTA 30 MG CAPSULE,DELAYED RELEASE: ORAL | 30 days supply | Qty: 90 | Fill #4

## 2022-07-08 ENCOUNTER — Other Ambulatory Visit (HOSPITAL_COMMUNITY): Payer: Self-pay | Admitting: Psychiatry

## 2022-07-08 ENCOUNTER — Other Ambulatory Visit: Payer: Self-pay | Admitting: Physician Assistant

## 2022-07-08 ENCOUNTER — Encounter: Payer: Self-pay | Admitting: Physician Assistant

## 2022-07-08 DIAGNOSIS — F319 Bipolar disorder, unspecified: Secondary | ICD-10-CM

## 2022-07-08 MED ORDER — ATORVASTATIN CALCIUM 20 MG PO TABS: 20.0000 mg | ORAL_TABLET | Freq: Every day | ORAL | 3 refills | Status: DC

## 2022-07-10 ENCOUNTER — Ambulatory Visit (INDEPENDENT_AMBULATORY_CARE_PROVIDER_SITE_OTHER): Payer: No Payment, Other | Admitting: Clinical

## 2022-07-10 DIAGNOSIS — F319 Bipolar disorder, unspecified: Secondary | ICD-10-CM

## 2022-07-12 NOTE — Plan of Care (Signed)
  Problem: Depression CCP Problem  1  Goal: STG: Frances "[name]" WILL IDENTIFY 3 COGNITIVE PATTERNS AND BELIEFS THAT SUPPORT DEPRESSION Outcome: Progressing   Problem: Depression CCP Problem  1  Goal: LTG: Frances Celeste "[name]" WILL SCORE LESS THAN 10 ON THE PATIENT HEALTH QUESTIONNAIRE (PHQ-9) Outcome: Not Progressing

## 2022-07-12 NOTE — Progress Notes (Signed)
THERAPIST PROGRESS NOTE Virtual Visit via Video Note  I connected with Frances [name] on 07/10/2022 at 10:00 AM EDT by a video enabled telemedicine application and verified that I am speaking with the correct person using two identifiers.  Location: Patient: home Provider: office   I discussed the limitations of evaluation and management by telemedicine and the availability of in person appointments. The patient expressed understanding and agreed to proceed.   Follow Up Instructions: I discussed the assessment and treatment plan with the patient. The patient was provided an opportunity to ask questions and all were answered. The patient agreed with the plan and demonstrated an understanding of the instructions.   The patient was advised to call back or seek an in-person evaluation if the symptoms worsen or if the condition fails to improve as anticipated.   Session Time: 40 minutes  Participation Level: Active  Behavioral Response: CasualAlertDepressed  Type of Therapy: Individual Therapy  Treatment Goals addressed: client will identify 3 cognitive patterns and beliefs that support depression  ProgressTowards Goals: Not Progressing  Interventions: CBT and Supportive  Summary:  Frances [name] is a 46 y.o. female who presents for the scheduled session oriented times five, appropriately dressed, and friendly. Client denied hallucinations and delusions. Client reported on today she is feeling fairly well. Client reported she has continued follow up with her doctors for her back pain. Client reported she is feeling some relief with the shot her doctor used in her back. Client reported she cannot lift heavy things as instructed by her doctor. Client reported she also had developed an rash on her arm which she went to the doctor for. Client reported she is trying to figure out if it is from her psychiatric medications or something else. Client reported that she virtually  attends other support groups with the [employer]. Client reported it is beneficial. Client reported she was thinking about the last sessions homework regarding processing anxiety and negative feelings about her father. Client reported she wants to get to a point when she doe snot feel anxiety talking and/or thinking about him. 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 Evidence of progress towards goal:  Client uses 1 positive behavioral activation skills to help with depression at least 5 days per week. Client reported 1 negative belief stemming from childhood that contributes to her depression.   Suicidal/Homicidal: Nowithout intent/plan  Therapist Response:  Therapist began the appointment asking the client how she has been doing since last seen. Therapist used CBT to engage using active listening and positive emotional support. Therapist used CBT to engage and ask the client about her chronic pain and how is effects her mood. Therapist used CBT to discuss previous session homework about her childhood trauma. Therapist used CBT to discuss steps of acceptance and underlying negative emotions that are barrier to letting things go. Therapist used CBT ask the client to identify her progress with frequency of use with coping skills with continued practice in her daily activity.       Plan: Return again in 3 weeks.  Diagnosis: bipolar 1 disorder  Collaboration of Care: Patient refused AEB none   Patient/Guardian was advised Release of Information must be obtained prior to any record release in order to collaborate their care with an outside provider. Patient/Guardian was advised if they have not already done so to contact the registration department to sign all necessary forms in order for [place] to release information regarding their care.  Consent: Patient/Guardian gives verbal consent for treatment and assignment of benefits for services provided  during this visit. Patient/Guardian expressed understanding and agreed to proceed.   [place], LCSW 07/10/2022

## 2022-07-17 ENCOUNTER — Other Ambulatory Visit: Payer: Self-pay | Admitting: Physician Assistant

## 2022-07-17 DIAGNOSIS — J310 Chronic rhinitis: Secondary | ICD-10-CM

## 2022-07-17 DIAGNOSIS — J3489 Other specified disorders of nose and nasal sinuses: Secondary | ICD-10-CM

## 2022-07-17 DIAGNOSIS — R0989 Other specified symptoms and signs involving the circulatory and respiratory systems: Secondary | ICD-10-CM

## 2022-07-20 ENCOUNTER — Emergency Department
Admission: EM | Admit: 2022-07-20 | Discharge: 2022-07-20 | Disposition: A | Discharge status: Home / Self Care | Payer: No Typology Code available for payment source | Attending: Family Medicine | Admitting: Family Medicine

## 2022-07-20 ENCOUNTER — Encounter: Payer: Self-pay | Admitting: Physician Assistant

## 2022-07-20 ENCOUNTER — Other Ambulatory Visit: Payer: Self-pay

## 2022-07-20 DIAGNOSIS — M545 Low back pain, unspecified: Secondary | ICD-10-CM

## 2022-07-20 DIAGNOSIS — M0609 Rheumatoid arthritis without rheumatoid factor, multiple sites: Secondary | ICD-10-CM

## 2022-07-20 DIAGNOSIS — G8929 Other chronic pain: Secondary | ICD-10-CM

## 2022-07-20 DIAGNOSIS — M5136 Other intervertebral disc degeneration, lumbar region: Secondary | ICD-10-CM

## 2022-07-20 DIAGNOSIS — M51369 Other intervertebral disc degeneration, lumbar region without mention of lumbar back pain or lower extremity pain: Secondary | ICD-10-CM

## 2022-07-20 DIAGNOSIS — M5431 Sciatica, right side: Secondary | ICD-10-CM

## 2022-07-20 DIAGNOSIS — M62830 Muscle spasm of back: Secondary | ICD-10-CM

## 2022-07-20 MED ORDER — METHOCARBAMOL 500 MG PO TABS: 500.0000 mg | ORAL_TABLET | Freq: Three times a day (TID) | ORAL | 0 refills | Status: DC | PRN

## 2022-07-20 MED ORDER — PREDNISONE 10 MG (21) PO TBPK: ORAL_TABLET | Freq: Every day | ORAL | 0 refills | Status: DC

## 2022-07-20 MED ORDER — METHYLPREDNISOLONE SODIUM SUCC 125 MG IJ SOLR
125.0000 mg | Freq: Once | INTRAMUSCULAR | Status: AC
  Administered 2022-07-20: 125 mg via INTRAMUSCULAR

## 2022-07-20 NOTE — Discharge Instructions (Addendum)
Advised patient to take medication as directed with food to completion.  Advised patient to start Esther Hardy tomorrow morning Monday, 07/21/2022.  Advised may use Robaxin daily or as needed for accompanying muscle spasms.  Encouraged patient to increase daily water intake while taking these medications.  Advised if symptoms worsen and/or unresolved please follow-up with PCP or here for further evaluation.

## 2022-07-20 NOTE — ED Triage Notes (Signed)
Pt presents to Urgent Care with c/o acute on chronic lower back pain x 2 days--no known injury. Reports hx of bulging/herniated discs. Also reports radiation of pain to R leg, no numbness/tingling.

## 2022-07-20 NOTE — ED Provider Notes (Signed)
[name]    CSN: [id number] Arrival date & time: 07/20/22  1219      History   Chief Complaint Chief Complaint  Patient presents with   Back Pain    HPI Frances [name] is a 46 y.o. female.   HPI 46 year old female presents with back pain for 2 days.  Denies injury or insult.  Reports pain radiating to right leg, no numbness/tingling PMH significant for her chronic back pain, degenerative disc disease of lumbar region, fibromyalgia, left-sided low back pain without sciatica, and personality disorder.  CT of lumbar sacral spine with contrast of 09/19/2021 revealed severe L4-L5 facet arthrosis with grade 1 retrolisthesis and moderate lateral recess stenosis, mild to moderate spinal stenosis and mild lateral recess stenosis at L3-L4, mild spinal stenosis at L2-3.  Past Medical History:  Diagnosis Date   Anxiety    Arthritis    Back pain, chronic    Bipolar 1 disorder (HCC)    Chronic fatigue syndrome    DDD (degenerative disc disease), lumbar    Depression    Diabetes mellitus without complication ([place])    Fibromyalgia    IUD 2012   Mirena   MTHFR gene mutation     Patient Active Problem List   Diagnosis Date Noted   Night sweats 06/18/2022   Irritant contact dermatitis due to plants, except food 06/18/2022   Acute left-sided low back pain without sciatica 04/07/2022   DEGENERATIVE DISC DISEASE, LUMBAR SPINE 04/07/2022   Acute midline low back pain without sciatica 04/07/2022   Weak urine stream 03/17/2022   Proteinuria 03/17/2022   Chronic right hip pain 01/06/2022   Acute pain of right shoulder 01/06/2022   Right lateral epicondylitis 12/31/2021   ETD (Eustachian tube dysfunction), bilateral 08/23/2021   SOB (shortness of breath) on exertion 08/20/2021   Overweight (BMI 25.0-29.9) 06/18/2021   Attention deficit hyperactivity disorder (ADHD), 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   Hyperlipidemia LDL goal <70 03/18/2021   Type 2 diabetes  mellitus with hyperglycemia, without long-term current use of insulin ([place]) 03/13/2021   Seasonal allergies 12/25/2020   Bilateral leg edema 11/19/2020   Costochondritis 10/22/2020   Rib pain on right side 10/22/2020   Tachycardia 10/22/2020   Symptomatic mammary hypertrophy 09/04/2020   Xiphoid pain 08/13/2020   Sternum pain 08/13/2020   Recurrent major depressive disorder, in partial remission ([place]) 05/24/2020   Generalized anxiety disorder 05/24/2020   DDD (degenerative disc disease), cervical 03/13/2020   Ingrown toenail of both feet 03/13/2020   Labral tear of hip, degenerative, right 01/11/2020   Grief reaction 11/28/2019   Toenail fungus 09/09/2019   Vasovagal response 09/09/2019   Decreased visual acuity 08/30/2018   Right corneal abrasion 08/30/2018   Elevated fasting glucose 04/08/2018   Anxiety 01/22/2018   Borderline personality disorder ([place]) 01/22/2018   Rheumatoid arthritis 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 ([place]) 12/09/2017   TMJ (temporomandibular joint syndrome) 09/26/2017   New daily persistent headache 09/26/2017   MTHFR gene mutation 09/20/2017   Bipolar I disorder ([place]) 09/16/2017   Chronic fatigue syndrome 09/16/2017   Back pain 10/13/2008   DERMATITIS, ALLERGIC 06/05/2008   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   DDD (degenerative disc disease), lumbar 02/10/2008   FIBROMYALGIA 02/10/2008   IRON, SERUM, ELEVATED 02/10/2008    Past Surgical History:  Procedure Laterality Date   COLONOSCOPY WITH PROPOFOL N/A 02/25/2017   Procedure: COLONOSCOPY WITH PROPOFOL;  Surgeon: Arta Silence, MD;  Location: [facility];  Service: Endoscopy;  Laterality: N/A;  left knee lateral release  1993   scar tisue removal  03/2005   left ankle   TONSILLECTOMY  age 83's    OB History     Gravida  0   Para  0   Term  0   Preterm  0   AB  0   Living  0      SAB  0   IAB  0   Ectopic  0   Multiple  0   Live Births                Home Medications    Prior to Admission medications   Medication Sig Start Date End Date Taking? Authorizing Provider  methocarbamol (ROBAXIN) 500 MG tablet Take 1 tablet (500 mg total) by mouth 3 (three) times daily as needed for muscle spasms. 07/20/22  Yes Eliezer Lofts, FNP  predniSONE (STERAPRED UNI-PAK 21 TAB) 10 MG (21) TBPK tablet Take by mouth daily. Take 6 tabs by mouth daily  for 2 days, then 5 tabs for 2 days, then 4 tabs for 2 days, then 3 tabs for 2 days, 2 tabs for 2 days, then 1 tab by mouth daily for 2 days 07/20/22  Yes Eliezer Lofts, FNP  albuterol (VENTOLIN HFA) 108 (90 Base) MCG/ACT inhaler Take 2 puffs 15 minutes before exercise and as needed for shortness of breath. 08/27/21   Breeback, Jade L, PA-C  ARIPiprazole (ABILIFY) 15 MG tablet Take 1 tablet (15 mg total) by mouth daily. 03/24/22   Hampton Abbot, MD  atomoxetine (STRATTERA) 40 MG capsule Take 1 capsule (40 mg total) by mouth daily. 05/28/22   Salley Slaughter, NP  atorvastatin (LIPITOR) 20 MG tablet Take 1 tablet (20 mg total) by mouth daily. 07/08/22   Breeback, Jade L, PA-C  Azelastine HCl 137 MCG/SPRAY SOLN USE 2 SPRAYS INTO ALERNATE NOSTRILS TWICE DAILY 07/17/22   Breeback, Jade L, PA-C  benztropine (COGENTIN) 0.5 MG tablet Take 1 tablet (0.5 mg total) by mouth daily. 05/28/22   Salley Slaughter, NP  blood glucose meter kit and supplies KIT Dispense based on patient and insurance preference. Use up to four times daily as directed. 05/29/21   Breeback, Jade L, PA-C  buPROPion (WELLBUTRIN XL) 150 MG 24 hr tablet Take 1 tablet (150 mg total) by mouth every morning. 06/06/22   Salley Slaughter, NP  buPROPion (WELLBUTRIN XL) 300 MG 24 hr tablet Take 1 tablet (300 mg total) by mouth daily. 06/06/22   Salley Slaughter, NP  cholecalciferol (VITAMIN D3) 25 MCG (1000 UT) tablet Take 1,000 Units by mouth daily.    [provider]  clobetasol cream (TEMOVATE) 7.40 % Apply 1 Application topically 2 (two) times  daily. 06/18/22   Breeback, Royetta Car, PA-C  diclofenac (VOLTAREN) 75 MG EC tablet Take 75 mg by mouth 2 (two) times daily.    [provider]  DULoxetine HCl 30 MG CSDR Take 90 mg by mouth daily.    [provider]  fluticasone (FLONASE) 50 MCG/ACT nasal spray USE 2 SPRAYS IN EACH NOSTRIL EVERY DAY 07/08/22   Breeback, Jade L, PA-C  HYDROcodone-acetaminophen (NORCO/VICODIN) 5-325 MG tablet Take 1 tablet by mouth 2 (two) times daily as needed for moderate pain (back pain). 05/13/22   Breeback, Royetta Car, PA-C  hydroxychloroquine (PLAQUENIL) 200 MG tablet Take 200 mg by mouth 2 (two) times daily. 11/21/21 11/21/22  [provider]  levocetirizine (XYZAL) 5 MG tablet Take 1 tablet (5 mg total)  by mouth every evening. 06/18/22   Iran Planas L, PA-C  metFORMIN (GLUCOPHAGE) 1000 MG tablet TAKE 1 Tablet  BY MOUTH TWICE DAILY WITH A MEAL 05/28/22   Breeback, Jade L, PA-C  montelukast (SINGULAIR) 10 MG tablet TAKE 1 Tablet BY MOUTH ONCE EVERY NIGHT AT BEDTIME 05/28/22   Breeback, Jade L, PA-C  pregabalin (LYRICA) 75 MG capsule TAKE 1 CAPSULE BY MOUTH TWICE A DAY 04/10/22   Silverio Decamp, MD  Semaglutide,0.25 or 0.5MG/DOS, (OZEMPIC, 0.25 OR 0.5 MG/DOSE,) 2 MG/1.5ML SOPN Inject 0.5 mg into the skin once a week. 06/18/22   Donella Stade, PA-C    Family History Family History  Problem Relation Age of Onset   Diabetes Mother    Stroke Mother    Lupus Mother    Hypertension Mother    Congestive Heart Failure Mother    Mental illness Brother        Not clear what his diagnosis is--may be related to previous drug use.   Cancer Maternal Grandmother        colon   Diabetes Maternal Grandfather    Depression Maternal Uncle    Alcohol abuse Maternal Uncle     Social History Social History   Tobacco Use   Smoking status: Never   Smokeless tobacco: Never  Vaping Use   Vaping Use: Never used  Substance Use Topics   Alcohol use: No    Alcohol/week: 0.0 standard drinks of  alcohol   Drug use: No     Allergies   Metaxalone   Review of Systems Review of Systems  Musculoskeletal:  Positive for back pain.     Physical Exam Triage Vital Signs ED Triage Vitals  Enc Vitals Group     BP      Pulse      Resp      Temp      Temp src      SpO2      Weight      Height      Head Circumference      Peak Flow      Pain Score      Pain Loc      Pain Edu?      Excl. in [place]?    No data found.  Updated Vital Signs BP 117/78 (BP Location: Right Arm)   Pulse (!) 111   Temp 99.4 F (37.4 C) (Oral)   Resp 20   Ht 5' 7"  (1.702 m)   Wt 162 lb (73.5 kg)   LMP 06/03/2022 (Approximate)   SpO2 97%   BMI 25.37 kg/m      Physical Exam Vitals and nursing note reviewed.  Constitutional:      General: She is not in acute distress.    Appearance: Normal appearance. She is obese. She is not ill-appearing.  HENT:     Head: Normocephalic and atraumatic.     Mouth/Throat:     Mouth: Mucous membranes are moist.     Pharynx: Oropharynx is clear.  Eyes:     Extraocular Movements: Extraocular movements intact.     Conjunctiva/sclera: Conjunctivae normal.     Pupils: Pupils are equal, round, and reactive to light.  Cardiovascular:     Rate and Rhythm: Normal rate and regular rhythm.     Pulses: Normal pulses.     Heart sounds: Normal heart sounds.  Pulmonary:     Effort: Pulmonary effort is normal.     Breath sounds: Normal breath sounds.  No wheezing, rhonchi or rales.  Musculoskeletal:     Cervical back: Normal range of motion and neck supple.     Comments: Lumbar sacral spine: TTP over spinous processes paraspinous muscles, no deformity noted, exam limited due to pain  Skin:    General: Skin is warm and dry.  Neurological:     General: No focal deficit present.     Mental Status: She is alert and oriented to person, place, and time. Mental status is at baseline.      UC Treatments / Results  Labs (all labs ordered are listed, but only  abnormal results are displayed) Labs Reviewed - No data to display  EKG   Radiology No results found.  Procedures Procedures (including critical care time)  Medications Ordered in UC Medications  methylPREDNISolone sodium succinate (SOLU-MEDROL) 125 mg/2 mL injection 125 mg (125 mg Intramuscular Given 07/20/22 1325)    Initial Impression / Assessment and Plan / UC Course  I have reviewed the triage vital signs and the nursing notes.  Pertinent labs & imaging results that were available during my care of the patient were reviewed by me and considered in my medical decision making (see chart for details).     MDM: 1.  Sciatica of right side-IM Solu-Medrol 125 mg given once in clinic prior to discharge, Rx'd Sterapred Unipak; 2. Muscle spasms of back-Rx'd Robaxin.  Advised patient to take medication as directed with food to completion.  Advised patient to start Esther Hardy tomorrow morning Monday, 07/21/2022.  Advised may use Robaxin daily or as needed for accompanying muscle spasms.  Encouraged patient to increase daily water intake while taking these medications.  Advised if symptoms worsen and/or unresolved please follow-up with PCP or here for further evaluation.  Patient discharged home, hemodynamically stable.  Final Clinical Impressions(s) / UC Diagnoses   Final diagnoses:  Sciatica of right side  Muscle spasm of back     Discharge Instructions      Advised patient to take medication as directed with food to completion.  Advised patient to start Esther Hardy tomorrow morning Monday, 07/21/2022.  Advised may use Robaxin daily or as needed for accompanying muscle spasms.  Encouraged patient to increase daily water intake while taking these medications.  Advised if symptoms worsen and/or unresolved please follow-up with PCP or here for further evaluation.     ED Prescriptions     Medication Sig Dispense Auth. Provider   predniSONE (STERAPRED UNI-PAK 21 TAB) 10 MG  (21) TBPK tablet Take by mouth daily. Take 6 tabs by mouth daily  for 2 days, then 5 tabs for 2 days, then 4 tabs for 2 days, then 3 tabs for 2 days, 2 tabs for 2 days, then 1 tab by mouth daily for 2 days 42 tablet Eliezer Lofts, FNP   methocarbamol (ROBAXIN) 500 MG tablet Take 1 tablet (500 mg total) by mouth 3 (three) times daily as needed for muscle spasms. 30 tablet Eliezer Lofts, FNP      I have reviewed the PDMP during this encounter.   Eliezer Lofts, [place] 07/20/22 1339

## 2022-07-21 MED ORDER — HYDROCODONE-ACETAMINOPHEN 5-325 MG PO TABS: 1.0000 | ORAL_TABLET | Freq: Two times a day (BID) | ORAL | 0 refills | Status: DC | PRN

## 2022-07-22 ENCOUNTER — Ambulatory Visit (INDEPENDENT_AMBULATORY_CARE_PROVIDER_SITE_OTHER): Payer: No Typology Code available for payment source | Admitting: Sports Medicine

## 2022-07-22 ENCOUNTER — Ambulatory Visit (INDEPENDENT_AMBULATORY_CARE_PROVIDER_SITE_OTHER): Payer: No Typology Code available for payment source

## 2022-07-22 DIAGNOSIS — M24159 Other articular cartilage disorders, unspecified hip: Secondary | ICD-10-CM

## 2022-07-22 NOTE — Progress Notes (Signed)
    Procedures performed today:    Procedure: Real-time Ultrasound Guided injection of the right hip joint Device: Samsung HS60  Verbal informed consent obtained.  Time-out conducted.  Noted no overlying erythema, induration, or other signs of local infection.  Skin prepped in a sterile fashion.  Local anesthesia: Topical Ethyl chloride.  With sterile technique and under real time ultrasound guidance: Noted minimal arthritic changes, 1 cc Kenalog 40, 2 cc lidocaine, 2 cc bupivacaine injected easily Completed without difficulty  Advised to call if fevers/chills, erythema, induration, drainage, or persistent bleeding.  Images permanently stored and available for review in PACS.  Impression: Technically successful ultrasound guided injection.  Independent interpretation of notes and tests performed by another provider:   None.  Brief History, Exam, Impression, and Recommendations:    Labral tear of hip, degenerative, right Frances [name] returns, she is a pleasant 46 year old female, she has a known labral tear from an MR arthrogram performed several years ago, she had a steroid injection back in November 2022 and she did really well, now she is having recurrence of pain, right groin, worse with internal rotation, repeat right hip joint steroid injection today. She did see a surgeon in the past who suggested hip arthroplasty.    ____________________________________________ Gwen Her. Dianah Field, M.D., ABFM., CAQSM., AME. Primary Care and Sports Medicine [facility]  Adjunct Professor of [place] [employer] [facility] of Medicine  Risk manager

## 2022-07-22 NOTE — Assessment & Plan Note (Signed)
Frances [name] returns, she is a pleasant 46 year old female, she has a known labral tear from an MR arthrogram performed several years ago, she had a steroid injection back in November 2022 and she did really well, now she is having recurrence of pain, right groin, worse with internal rotation, repeat right hip joint steroid injection today. She did see a surgeon in the past who suggested hip arthroplasty.

## 2022-07-28 ENCOUNTER — Ambulatory Visit (INDEPENDENT_AMBULATORY_CARE_PROVIDER_SITE_OTHER): Payer: No Payment, Other | Admitting: Clinical

## 2022-07-28 DIAGNOSIS — F319 Bipolar disorder, unspecified: Secondary | ICD-10-CM
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
## 2022-07-30 ENCOUNTER — Other Ambulatory Visit: Payer: Self-pay

## 2022-07-30 DIAGNOSIS — M545 Low back pain, unspecified: Secondary | ICD-10-CM

## 2022-07-30 DIAGNOSIS — M0609 Rheumatoid arthritis without rheumatoid factor, multiple sites: Secondary | ICD-10-CM

## 2022-07-30 DIAGNOSIS — G8929 Other chronic pain: Secondary | ICD-10-CM

## 2022-07-30 DIAGNOSIS — M5136 Other intervertebral disc degeneration, lumbar region: Secondary | ICD-10-CM

## 2022-07-30 NOTE — Telephone Encounter (Signed)
Frances [name] states the Hydrocodone should have been sent to [place].   Pended prescription and pharmacy.

## 2022-08-01 MED ORDER — HYDROCODONE-ACETAMINOPHEN 5-325 MG PO TABS: 1.0000 | ORAL_TABLET | Freq: Two times a day (BID) | ORAL | 0 refills | Status: DC | PRN

## 2022-08-03 NOTE — Plan of Care (Signed)
  Problem: Depression CCP Problem  1  Goal: LTG: Frances "[name]" WILL SCORE LESS THAN 10 ON THE PATIENT HEALTH QUESTIONNAIRE (PHQ-9) Outcome: Progressing Goal: STG: Frances "[name]" WILL IDENTIFY 3 COGNITIVE PATTERNS AND BELIEFS THAT SUPPORT DEPRESSION Outcome: Progressing   

## 2022-08-12 MED FILL — CYMBALTA 30 MG CAPSULE,DELAYED RELEASE: ORAL | 30 days supply | Qty: 90 | Fill #5

## 2022-08-12 MED FILL — DICLOFENAC SODIUM 75 MG TABLET,DELAYED RELEASE: ORAL | 30 days supply | Qty: 60 | Fill #1

## 2022-08-12 MED FILL — HYDROXYCHLOROQUINE 200 MG TABLET: ORAL | 30 days supply | Qty: 60 | Fill #1

## 2022-08-14 ENCOUNTER — Other Ambulatory Visit (HOSPITAL_COMMUNITY): Payer: Self-pay | Admitting: Psychiatry

## 2022-08-14 ENCOUNTER — Telehealth (HOSPITAL_COMMUNITY): Payer: Self-pay | Admitting: *Deleted

## 2022-08-14 DIAGNOSIS — F900 Attention-deficit hyperactivity disorder, predominantly inattentive type: Secondary | ICD-10-CM

## 2022-08-14 DIAGNOSIS — F319 Bipolar disorder, unspecified: Secondary | ICD-10-CM

## 2022-08-14 MED ORDER — ATOMOXETINE HCL 40 MG PO CAPS: 40.0000 mg | ORAL_CAPSULE | Freq: Every day | ORAL | 3 refills | Status: DC

## 2022-08-14 MED ORDER — ARIPIPRAZOLE 15 MG PO TABS: 15.0000 mg | ORAL_TABLET | Freq: Every day | ORAL | 3 refills | Status: DC

## 2022-08-14 MED ORDER — BUPROPION HCL ER (XL) 300 MG PO TB24: 300.0000 mg | ORAL_TABLET | Freq: Every day | ORAL | 3 refills | Status: DC

## 2022-08-14 MED ORDER — BUPROPION HCL ER (XL) 150 MG PO TB24: 150.0000 mg | ORAL_TABLET | Freq: Every morning | ORAL | 3 refills | Status: DC

## 2022-08-14 MED ORDER — BENZTROPINE MESYLATE 0.5 MG PO TABS: 0.5000 mg | ORAL_TABLET | Freq: Every day | ORAL | 3 refills | Status: DC

## 2022-08-14 NOTE — Telephone Encounter (Signed)
Medication refilled and sent to preferred pharmacy

## 2022-08-14 NOTE — Telephone Encounter (Signed)
Patient called stated she out of her med's & requested refill -- ARIPiprazole (ABILIFY) 15 MG tablet Send to [facility]

## 2022-08-18 ENCOUNTER — Ambulatory Visit (INDEPENDENT_AMBULATORY_CARE_PROVIDER_SITE_OTHER): Payer: No Payment, Other | Admitting: Clinical

## 2022-08-18 DIAGNOSIS — F319 Bipolar disorder, unspecified: Secondary | ICD-10-CM

## 2022-08-18 NOTE — Progress Notes (Signed)
THERAPIST PROGRESS NOTE Virtual Visit via Video Note  I connected with Frances [name] on 08/18/22 at  1:00 PM EDT by a video enabled telemedicine application and verified that I am speaking with the correct person using two identifiers.  Location: Patient: home Provider: office   I discussed the limitations of evaluation and management by telemedicine and the availability of in person appointments. The patient expressed understanding and agreed to proceed.   Follow Up Instructions: I discussed the assessment and treatment plan with the patient. The patient was provided an opportunity to ask questions and all were answered. The patient agreed with the plan and demonstrated an understanding of the instructions.   The patient was advised to call back or seek an in-person evaluation if the symptoms worsen or if the condition fails to improve as anticipated.   Session Time: 35 minutes  Participation Level: Active  Behavioral Response: CasualAlertEuthymic  Type of Therapy: Individual Therapy  Treatment Goals addressed: client will identify 3 cognitive patterns and beliefs that support depression  ProgressTowards Goals: Progressing  Interventions: CBT and Supportive  Summary:  Frances [name] is a 46 y.o. female who presents for the scheduled appointment oriented times five, appropriately dressed, and friendly. Client denied hallucinations and delusions. Client reported on today she is doing pretty well. Client reported she will be fostering some new kittens. Client reported that helps to occupy her and elevate her mood. Client reported she did have a difficult time with getting her prescription for Abilify from the pharmacy. Client reported miscommunication between the pharmacy and her psychiatrist. Client reported she stretched it out to once every other day until she was out. Client reported now that it is filled she is not sure that she needs it. Client reported she  thought it was a #1 medication she needed but seems to being doing okay with it now being approximately 2 weeks without it. Client reported she will discuss that with her psychiatrist next week at her med check. Client reported as of today she rates her depressive symptoms as a "1" with 10 being the worst. Client reported she has noticed she does not talk to herself in a condescending way such as calling herself "stupid". Client reported she has been working to keep herself in a good mood by talking with friends. Client reported she has been attending additional mental health groups on a weekly basis. Client engaged in discussing thoughts and feelings about her father. Client reported she gets butterfly's in her stomach thinking about her father.  Evidence of progress towards goal:  client reported positive improvement with PHQ-9 score. Client also reported using 2 positive positive behavioral activation skills of fostering animals and talking to friends.   Suicidal/Homicidal: Nowithout intent/plan  Therapist Response:  Therapist began the appointment asking the client how she has been doing since last seen. Therapist used CBT to engage in active listening and positive emotional support. Therapist used CBT to engage and ask the client about medication compliance and side effects. Therapist used CBT to engage and ask the client about the severity of her depression symptoms and how she uses activity to help manage. Therapist used CBT to discuss previous discussion about her anxiety processing her feelings about family conflict. Therapist used CBT ask the client to identify her progress with frequency of use with coping skills with continued practice in her daily activity.    Therapist assigned the client homework to practice giving herself exposure about thinking about her father.  Plan: Return again in 5 weeks.  Diagnosis: bipolar 1 disorder  Collaboration of Care: Patient refused AEB none  requested by the client.  Patient/Guardian was advised Release of Information must be obtained prior to any record release in order to collaborate their care with an outside provider. Patient/Guardian was advised if they have not already done so to contact the registration department to sign all necessary forms in order for [place] to release information regarding their care.   Consent: Patient/Guardian gives verbal consent for treatment and assignment of benefits for services provided during this visit. Patient/Guardian expressed understanding and agreed to proceed.   [place], LCSW 08/18/2022

## 2022-08-21 ENCOUNTER — Other Ambulatory Visit: Payer: Self-pay | Admitting: Sports Medicine

## 2022-08-21 DIAGNOSIS — M5030 Other cervical disc degeneration, unspecified cervical region: Secondary | ICD-10-CM

## 2022-08-28 ENCOUNTER — Encounter (HOSPITAL_COMMUNITY): Payer: Self-pay | Admitting: Psychiatry

## 2022-08-28 ENCOUNTER — Telehealth (INDEPENDENT_AMBULATORY_CARE_PROVIDER_SITE_OTHER): Payer: No Payment, Other | Admitting: Psychiatry

## 2022-08-28 DIAGNOSIS — F900 Attention-deficit hyperactivity disorder, predominantly inattentive type: Secondary | ICD-10-CM | POA: Diagnosis not present

## 2022-08-28 DIAGNOSIS — F319 Bipolar disorder, unspecified: Secondary | ICD-10-CM

## 2022-08-28 MED ORDER — ARIPIPRAZOLE 15 MG PO TABS: 15.0000 mg | ORAL_TABLET | Freq: Every day | ORAL | 3 refills | Status: DC

## 2022-08-28 MED ORDER — BUPROPION HCL ER (XL) 150 MG PO TB24: 150.0000 mg | ORAL_TABLET | Freq: Every morning | ORAL | 3 refills | Status: DC

## 2022-08-28 MED ORDER — BUPROPION HCL ER (XL) 300 MG PO TB24: 300.0000 mg | ORAL_TABLET | Freq: Every day | ORAL | 3 refills | Status: DC

## 2022-08-28 MED ORDER — BENZTROPINE MESYLATE 0.5 MG PO TABS: 0.5000 mg | ORAL_TABLET | Freq: Every day | ORAL | 3 refills | Status: DC

## 2022-08-28 NOTE — Progress Notes (Signed)
BH MD/PA/NP OP Progress Note  Virtual Visit via Video Note  I connected with Para March on 08/28/22 at 11:30 AM EDT by a video enabled telemedicine application and verified that I am speaking with the correct person using two identifiers.  Location: Patient: Home Provider: Clinic   I discussed the limitations of evaluation and management by telemedicine and the availability of in person appointments. The patient expressed understanding and agreed to proceed.  I provided 30 minutes of non-face-to-face time during this encounter.           08/28/2022 12:33 PM Frances [name]  MRN:  [id number]  Chief Complaint: "I have been out of my Abilify for two week"  HPI: 46 year old female seen today for follow up psychiatric evaluation. She has a psychiatric history of bipolar 1, borderline personality disorder, depression, and anxiety. She is currently managed on Abilify 15 mg nightly, Cogentin 0.5 mg daily, Wellbutrin 450 mg daily, and Cymbalta 90 mg daily (from rheumatologist). Today, patient notes that medications are somewhat effective in managing her symptoms.   Today the patient was well-groomed, pleasant, cooperative, engaged in eye contact, and engaged in conversation. She informed provider that she was out of her Abilify for 2 weeks.  She notes that not much change with her mood.  Provider asked patient if she wanted to discontinue Abilify and she notes that she did not as the winter months increases depressive episodes.  Today patient notes that her mood is stable and inform her that she has minimal anxiety and depression.  Provider conducted a GAD-7 and patient scored a 2, at her last visit she scored a 3.  Provider also conducted PHQ-9 and patient scored a 4, at her last visit she scored an 11.  She endorses adequate sleep and appetite.  Today she denies SI/HI/AVH, mania, paranoia.    Patient informed Probation officer that she has back pain.  She notes that she is able to cope with  it with the assistance of Vicodin and muscle relaxers.  She former that she tries to take these medications infrequently.  Patient informed Probation officer that Christianne Borrow has not been effective in managing her ADHD.  She notes that she continues to be forgetful, disorganized, inattentive to mentally taxing task, and at times she has poor listening skills.  She notes that occasionally she zones out.  Provider recommended increasing Strattera.  She informed Probation officer that she was agreeable to this.  Strattera increased from 40 mg daily to 80 mg daily.  Patient request that Strattera not be refilled as she has an increased quantity.  She will continue all other medications as prescribed.  No other concerns at this time.       Visit Diagnosis:    ICD-10-CM   1. Bipolar I disorder (HCC)  F31.9 ARIPiprazole (ABILIFY) 15 MG tablet    benztropine (COGENTIN) 0.5 MG tablet    buPROPion (WELLBUTRIN XL) 150 MG 24 hr tablet    buPROPion (WELLBUTRIN XL) 300 MG 24 hr tablet    2. Attention deficit hyperactivity disorder (ADHD), predominantly inattentive type  F90.0 buPROPion (WELLBUTRIN XL) 150 MG 24 hr tablet    buPROPion (WELLBUTRIN XL) 300 MG 24 hr tablet      Past Psychiatric History: Bipolar, anxiety, and depression Past Medical History:  Past Medical History:  Diagnosis Date   Anxiety    Arthritis    Back pain, chronic    Bipolar 1 disorder (HCC)    Chronic fatigue syndrome    DDD (  degenerative disc disease), lumbar    Depression    Diabetes mellitus without complication ([place])    Fibromyalgia    IUD 2012   Mirena   MTHFR gene mutation     Past Surgical History:  Procedure Laterality Date   COLONOSCOPY WITH PROPOFOL N/A 02/25/2017   Procedure: COLONOSCOPY WITH PROPOFOL;  Surgeon: Arta Silence, MD;  Location: [facility];  Service: Endoscopy;  Laterality: N/A;   left knee lateral release  1993   scar tisue removal  03/2005   left ankle   TONSILLECTOMY  age 37's    Family Psychiatric History:  Maternal aunts Bipolar disorder and uncle alcoholic  Family History:  Family History  Problem Relation Age of Onset   Diabetes Mother    Stroke Mother    Lupus Mother    Hypertension Mother    Congestive Heart Failure Mother    Mental illness Brother        Not clear what his diagnosis is--may be related to previous drug use.   Cancer Maternal Grandmother        colon   Diabetes Maternal Grandfather    Depression Maternal Uncle    Alcohol abuse Maternal Uncle     Social History:  Social History   Socioeconomic History   Marital status: Single    Spouse name: Not on file   Number of children: 0   Years of education: Not on file   Highest education level: Bachelor's degree (e.g., BA, AB, BS)  Occupational History   Occupation: Designer, industrial/product at times  Tobacco Use   Smoking status: Never   Smokeless tobacco: Never  Vaping Use   Vaping Use: Never used  Substance and Sexual Activity   Alcohol use: No    Alcohol/week: 0.0 standard drinks of alcohol   Drug use: No   Sexual activity: Not on file  Other Topics Concern   Not on file  Social History Narrative   Originally from [place], outside of [place].    Moved here permanently 2012.   Intermittently worked on a farm.   Lives with many cats--3 plus fosters cats.    Single, lives alone in a one story home. Rarely drinks caffeine. 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   Social Determinants of Health   Financial Resource Strain: Medium Risk (12/06/2020)   Overall Financial Resource Strain (CARDIA)    Difficulty of Paying Living Expenses: Somewhat hard  Food Insecurity: No Food Insecurity (12/06/2020)   Hunger Vital Sign    Worried About Running Out of Food in the Last Year: Never true    Ran Out of Food in the Last Year: Never true  Transportation Needs: No Transportation Needs (12/06/2020)   PRAPARE - Hydrologist (Medical): No    Lack of Transportation  (Non-Medical): No  Physical Activity: Inactive (12/06/2020)   Exercise Vital Sign    Days of Exercise per Week: 0 days    Minutes of Exercise per Session: 0 min  Stress: No Stress Concern Present (12/06/2020)   [place]    Feeling of Stress : Not at all  Social Connections: Moderately Isolated (12/06/2020)   Social Connection and Isolation Panel [NHANES]    Frequency of Communication with Friends and Family: More than three times a week    Frequency of Social Gatherings with Friends and Family: More than three times a week    Attends Religious Services: Never  Active Member of Clubs or Organizations: Yes    Attends Archivist Meetings: More than 4 times per year    Marital Status: Never married    Allergies:  Allergies  Allergen Reactions   Metaxalone Hives    Metabolic Disorder Labs: Lab Results  Component Value Date   HGBA1C 5.1 06/18/2022   MPG 103 04/14/2018   No results found for: "PROLACTIN" Lab Results  Component Value Date   CHOL 157 01/20/2022   TRIG 124 01/20/2022   HDL 46 01/20/2022   CHOLHDL 3.4 01/20/2022   LDLCALC 89 01/20/2022   LDLCALC 73 04/14/2018   Lab Results  Component Value Date   TSH 0.866 11/21/2020   TSH 1.616 01/27/2018    Therapeutic Level Labs: No results found for: "LITHIUM" No results found for: "VALPROATE" No results found for: "CBMZ"  Current Medications: Current Outpatient Medications  Medication Sig Dispense Refill   albuterol (VENTOLIN HFA) 108 (90 Base) MCG/ACT inhaler Take 2 puffs 15 minutes before exercise and as needed for shortness of breath. 6.7 g 1   ARIPiprazole (ABILIFY) 15 MG tablet Take 1 tablet (15 mg total) by mouth daily. 30 tablet 3   atomoxetine (STRATTERA) 40 MG capsule Take 1 capsule (40 mg total) by mouth daily. 30 capsule 3   atorvastatin (LIPITOR) 20 MG tablet Take 1 tablet (20 mg total) by mouth daily. 90 tablet 3    Azelastine HCl 137 MCG/SPRAY SOLN USE 2 SPRAYS INTO ALERNATE NOSTRILS TWICE DAILY 30 mL 2   benztropine (COGENTIN) 0.5 MG tablet Take 1 tablet (0.5 mg total) by mouth daily. 30 tablet 3   blood glucose meter kit and supplies KIT Dispense based on patient and insurance preference. Use up to four times daily as directed. 1 each 0   buPROPion (WELLBUTRIN XL) 150 MG 24 hr tablet Take 1 tablet (150 mg total) by mouth every morning. 30 tablet 3   buPROPion (WELLBUTRIN XL) 300 MG 24 hr tablet Take 1 tablet (300 mg total) by mouth daily. 30 tablet 3   cholecalciferol (VITAMIN D3) 25 MCG (1000 UT) tablet Take 1,000 Units by mouth daily.     clobetasol cream (TEMOVATE) 5.27 % Apply 1 Application topically 2 (two) times daily. 60 g 0   diclofenac (VOLTAREN) 75 MG EC tablet Take 75 mg by mouth 2 (two) times daily.     DULoxetine HCl 30 MG CSDR Take 90 mg by mouth daily.     fluticasone (FLONASE) 50 MCG/ACT nasal spray USE 2 SPRAYS IN EACH NOSTRIL EVERY DAY 16 g 1   HYDROcodone-acetaminophen (NORCO/VICODIN) 5-325 MG tablet Take 1 tablet by mouth 2 (two) times daily as needed for moderate pain (back pain). 60 tablet 0   hydroxychloroquine (PLAQUENIL) 200 MG tablet Take 200 mg by mouth 2 (two) times daily.     levocetirizine (XYZAL) 5 MG tablet Take 1 tablet (5 mg total) by mouth every evening. 90 tablet 1   metFORMIN (GLUCOPHAGE) 1000 MG tablet TAKE 1 Tablet  BY MOUTH TWICE DAILY WITH A MEAL 180 tablet 1   methocarbamol (ROBAXIN) 500 MG tablet Take 1 tablet (500 mg total) by mouth 3 (three) times daily as needed for muscle spasms. 30 tablet 0   montelukast (SINGULAIR) 10 MG tablet TAKE 1 Tablet BY MOUTH ONCE EVERY NIGHT AT BEDTIME 90 tablet 1   predniSONE (STERAPRED UNI-PAK 21 TAB) 10 MG (21) TBPK tablet Take by mouth daily. Take 6 tabs by mouth daily  for 2 days, then 5 tabs  for 2 days, then 4 tabs for 2 days, then 3 tabs for 2 days, 2 tabs for 2 days, then 1 tab by mouth daily for 2 days 42 tablet 0    pregabalin (LYRICA) 75 MG capsule TAKE 1 CAPSULE BY MOUTH TWICE A DAY 180 capsule 1   Semaglutide,0.25 or 0.5MG/DOS, (OZEMPIC, 0.25 OR 0.5 MG/DOSE,) 2 MG/1.5ML SOPN Inject 0.5 mg into the skin once a week. 4.5 mL 0   No current facility-administered medications for this visit.     Musculoskeletal: Strength & Muscle Tone: within normal limits and  telehealth visit Gait & Station: normal, telehealth visit Patient leans: N/A  Psychiatric Specialty Exam: Review of Systems  There were no vitals taken for this visit.There is no height or weight on file to calculate BMI.  General Appearance: Well Groomed  Eye Contact:  Good  Speech:  Clear and Coherent and Normal Rate  Volume:  Normal  Mood:  Euthymic  Affect:  Congruent  Thought Process:  Coherent, Goal Directed and Linear  Orientation:  Full (Time, Place, and Person)  Thought Content: WDL and Logical   Suicidal Thoughts:  No  Homicidal Thoughts:  No  Memory:  Immediate;   Good Recent;   Good Remote;   Good  Judgement:  Good  Insight:  Good  Psychomotor Activity:  Normal  Concentration:  Concentration: Fair and Attention Span: Fair  Recall:  Good  Fund of Knowledge: Good  Language: Good  Akathisia:  No  Handed:  Right  AIMS (if indicated): Not done  Assets:  Communication Skills Desire for Improvement Financial Resources/Insurance Housing Social Support  ADL's:  Intact  Cognition: WNL  Sleep:  Good   Screenings: AIMS    Flowsheet Row Admission (Discharged) from 01/25/2018 in [place] [facility]  AIMS Total Score 0      AUDIT    Flowsheet Row Admission (Discharged) from 01/25/2018 in [facility]  Alcohol Use Disorder Identification Test Final Score (AUDIT) 0      GAD-7    Flowsheet Row Video Visit from 08/28/2022 in [facility] Video Visit from 05/28/2022 in [facility] Video Visit from 01/13/2022 in  [facility] Video Visit from 10/28/2021 in [facility] Office Visit from 09/17/2021 in [place]  Total GAD-7 Score _0 PHQ2-9    Flowsheet Row Video Visit from 08/28/2022 in [facility] Video Visit from 05/28/2022 in [facility] Counselor from 02/21/2022 in [facility] Video Visit from 01/13/2022 in [facility] Counselor from 01/07/2022 in [facility]  PHQ-2 Total Score 0 _1 PHQ-9 Total Score _2 Flowsheet Row ED from 07/20/2022 in [facility] at [facility] ED from 06/16/2022 in [facility] at [facility] ED from 04/06/2022 in [facility] [place] Error: Question 6 not populated Error: Question 6 not populated No Risk        Assessment and Plan: Patient reports that she has been out of her Abilify for 2 weeks.  She however notes that her mood is stable.  She also informed Probation officer that she has been having issues with ADHD and found Strattera ineffective at this current  dose.   Strattera increased from 40 mg daily to 80 mg daily.  Patient request that Strattera not be refilled as she has an increased quantity.  She will continue all other medications as prescribed.  1. Bipolar I disorder (HCC)  Continue- benztropine (COGENTIN) 0.5 MG tablet; Take 1 tablet (0.5 mg total) by mouth daily.  Dispense: 30 tablet; Refill: 3 Continue- buPROPion (WELLBUTRIN XL) 300 MG 24 hr tablet; Take 1 tablet (300 mg total) by mouth daily.  Dispense: 30 tablet; Refill: 2 Continue- buPROPion (WELLBUTRIN XL) 150 MG 24 hr tablet; Take 1 tablet (150 mg total) by mouth every morning.  Dispense: 30 tablet; Refill: 3  2. Attention deficit hyperactivity disorder (ADHD), predominantly inattentive  type  Increase- atomoxetine (STRATTERA) 40 MG capsule; Take 2 capsule (80 mg total) by mouth daily.  Dispense: 30 capsule; Refill: 3 Continue- buPROPion (WELLBUTRIN XL) 300 MG 24 hr tablet; Take 1 tablet (300 mg total) by mouth daily.  Dispense: 30 tablet; Refill: 2 Continue- buPROPion (WELLBUTRIN XL) 150 MG 24 hr tablet; Take 1 tablet (150 mg total) by mouth every morning.  Dispense: 30 tablet; Refill: 3   Follow up in 3 moths Follow up with therapy   Salley Slaughter, NP 08/28/2022, 12:33 PM

## 2022-08-29 NOTE — Plan of Care (Signed)
  Problem: Depression CCP Problem  1  Goal: LTG: Acsa "[name]" WILL SCORE LESS THAN 10 ON THE PATIENT HEALTH QUESTIONNAIRE (PHQ-9) Outcome: Progressing Goal: STG: Marlen "[name]" WILL IDENTIFY 3 COGNITIVE PATTERNS AND BELIEFS THAT SUPPORT DEPRESSION Outcome: Progressing   

## 2022-09-02 ENCOUNTER — Telehealth (INDEPENDENT_AMBULATORY_CARE_PROVIDER_SITE_OTHER): Payer: Self-pay | Admitting: Specialist

## 2022-09-02 NOTE — Telephone Encounter (Signed)
Pt called requesting an updated letter of why she is unable to work. Please call pt when ready for pick up. Pt phone number is [phone].

## 2022-09-03 ENCOUNTER — Encounter: Payer: Self-pay | Admitting: Specialist

## 2022-09-03 NOTE — Telephone Encounter (Signed)
I put a letter in her chart today indicating that she is only able to perform partime work and that surgery is not likely to make her  Much better.

## 2022-09-03 NOTE — Telephone Encounter (Signed)
I called and advised that the note is ready, she will pick it up.

## 2022-09-09 ENCOUNTER — Ambulatory Visit (INDEPENDENT_AMBULATORY_CARE_PROVIDER_SITE_OTHER): Payer: No Typology Code available for payment source | Admitting: Physician Assistant

## 2022-09-09 ENCOUNTER — Encounter: Payer: Self-pay | Admitting: Physician Assistant

## 2022-09-09 VITALS — BP 114/79 | HR 100 | Ht 67.0 in | Wt 167.0 lb

## 2022-09-09 DIAGNOSIS — Z23 Encounter for immunization: Secondary | ICD-10-CM

## 2022-09-09 DIAGNOSIS — R809 Proteinuria, unspecified: Secondary | ICD-10-CM | POA: Insufficient documentation

## 2022-09-09 DIAGNOSIS — L600 Ingrowing nail: Secondary | ICD-10-CM

## 2022-09-09 DIAGNOSIS — E119 Type 2 diabetes mellitus without complications: Secondary | ICD-10-CM

## 2022-09-09 DIAGNOSIS — M5136 Other intervertebral disc degeneration, lumbar region: Secondary | ICD-10-CM

## 2022-09-09 DIAGNOSIS — M797 Fibromyalgia: Principal | ICD-10-CM

## 2022-09-09 LAB — POCT UA - MICROALBUMIN
Creatinine, POC: 300 mg/dL
Microalbumin Ur, POC: 150 mg/L

## 2022-09-09 LAB — POCT GLYCOSYLATED HEMOGLOBIN (HGB A1C): HbA1c, POC (controlled diabetic range): 5.2 % (ref 0.0–7.0)

## 2022-09-09 MED ORDER — DULOXETINE 30 MG CAPSULE,DELAYED RELEASE
ORAL_CAPSULE | Freq: Every day | ORAL | 5 refills | 30 days | Status: CP
Start: 2022-09-09 — End: ?
  Filled 2022-09-11: qty 90, 30d supply, fill #0

## 2022-09-09 MED ORDER — PREDNISONE 50 MG PO TABS: ORAL_TABLET | ORAL | 0 refills | Status: DC

## 2022-09-09 NOTE — Progress Notes (Unsigned)
Established Patient Office Visit  Subjective   Patient ID: Frances [name], female    DOB: Oct 25, 1976  Age: 46 y.o. MRN: [id number]  Chief Complaint  Patient presents with   Follow-up   Diabetes    HPI Pt is a 46 yo female with T2DM, CFS, lumbar DDD, RA, Bipolar 1, GAD who presents to the clinic for 3 month follow.   She is not checking sugars and remains on ozempic. She denies any hypoglycemic events. No open wounds. No CP, palpitations, headaches or vision changes.  She has chronic low back pain and feels like she is headed into a flare. She has muscle relaxers, NSAIDS, and pain medication. No pain into legs or leg weakness. No injury.   She continues to have intermittent medial great right toenail pain and discomfort like her toenail is growing back ingrown. No blood or pus. It hurts more when it rubs in shoes and touched.   .. 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   DERMATITIS, ALLERGIC 06/05/2008   DDD (degenerative disc disease), lumbar 02/10/2008   Back pain 10/13/2008   FIBROMYALGIA 02/10/2008   IRON, SERUM, ELEVATED 02/10/2008   Bipolar I disorder ([place]) 09/16/2017   Chronic fatigue syndrome 09/16/2017   MTHFR gene mutation 09/20/2017   TMJ (temporomandibular joint syndrome) 09/26/2017   New daily persistent headache 09/26/2017   Rheumatoid arthritis 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 ([place]) 12/09/2017   Anxiety 01/22/2018   Borderline personality disorder ([place]) 01/22/2018   Elevated fasting glucose 04/08/2018   Decreased visual acuity 08/30/2018   Right corneal abrasion 08/30/2018   Toenail fungus 09/09/2019   Vasovagal response 09/09/2019   Grief reaction 11/28/2019   Labral tear of hip, degenerative, right 01/11/2020   DDD (degenerative disc disease), cervical 03/13/2020   Ingrown toenail of both feet 03/13/2020   Recurrent major depressive disorder, in partial remission ([place]) 05/24/2020    Generalized anxiety disorder 05/24/2020   Xiphoid pain 08/13/2020   Sternum pain 08/13/2020   Symptomatic mammary hypertrophy 09/04/2020   Costochondritis 10/22/2020   Rib pain on right side 10/22/2020   Tachycardia 10/22/2020   Bilateral leg edema 11/19/2020   Seasonal allergies 12/25/2020   Type 2 diabetes mellitus without complication, without long-term current use of insulin ([place]) 03/13/2021   Hyperlipidemia LDL goal <70 03/18/2021   Attention deficit hyperactivity disorder (ADHD), 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   Overweight (BMI 25.0-29.9) 06/18/2021   SOB (shortness of breath) on exertion 08/20/2021   ETD (Eustachian tube dysfunction), bilateral 08/23/2021   Right lateral epicondylitis 12/31/2021   Chronic right hip pain 01/06/2022   Acute pain of right shoulder 01/06/2022   Weak urine stream 03/17/2022   Proteinuria 03/17/2022   Acute left-sided low back pain without sciatica 04/07/2022   DEGENERATIVE DISC DISEASE, LUMBAR SPINE 04/07/2022   Acute midline low back pain without sciatica 04/07/2022   Night sweats 06/18/2022   Irritant contact dermatitis due to plants, except food 06/18/2022   Ingrown toenail of right foot 09/09/2022   Microalbuminuria 09/09/2022   Resolved Ambulatory Problems    Diagnosis Date Noted   FEVER, RECURRENT 04/02/2009   Hypothyroidism 02/10/2008   SINUSITIS- ACUTE-NOS 03/24/2008   URI 01/17/2009   ANKLE PAIN, BILATERAL 07/28/2008   FATIGUE 05/05/2008   OPEN WOUND FT NO TOE ALONE WITHOUT MENTION COMP 03/28/2009   DYSPNEA 10/15/2011   Fibromyalgia 09/16/2017   Depression    Bipolar 1 disorder, depressed, severe ([place]) 01/25/2018  Chronic right-sided low back pain without sciatica 01/11/2020   Macromastia 06/29/2020   Class 1 obesity due to excess calories without serious comorbidity with body mass index (BMI) of 32.0 to 32.9 in adult 08/13/2020   Hepatomegaly 10/22/2020   Chest congestion 11/20/2020   Sinus drainage 11/20/2020    Ear popping, bilateral 08/20/2021   Past Medical History:  Diagnosis Date   Arthritis    Back pain, chronic    Bipolar 1 disorder ([place])    Diabetes mellitus without complication ([place])    IUD 2012     ROS See HPI.    Objective:     BP 114/79   Pulse 100   Ht '5\' 7"'$  (1.702 m)   Wt 167 lb (75.8 kg)   SpO2 100%   BMI 26.16 kg/m  BP Readings from Last 3 Encounters:  09/09/22 114/79  07/20/22 117/78  06/18/22 114/76   Wt Readings from Last 3 Encounters:  09/09/22 167 lb (75.8 kg)  07/20/22 162 lb (73.5 kg)  06/18/22 163 lb (73.9 kg)      Physical Exam Constitutional:      Appearance: Normal appearance.  HENT:     Head: Normocephalic.  Cardiovascular:     Rate and Rhythm: Normal rate and regular rhythm.     Pulses: Normal pulses.     Heart sounds: Normal heart sounds.  Pulmonary:     Breath sounds: Normal breath sounds.  Musculoskeletal:     Comments: Great right medial toenail tenderness to palpation and some erythema of the medial nail and surrounding skin.   Neurological:     General: No focal deficit present.     Mental Status: She is alert and oriented to person, place, and time.  Psychiatric:        Mood and Affect: Mood normal.      .. Results for orders placed or performed in visit on 09/09/22  POCT glycosylated hemoglobin (Hb A1C)  Result Value Ref Range   Hemoglobin A1C     HbA1c POC (<> result, manual entry)     HbA1c, POC (prediabetic range)     HbA1c, POC (controlled diabetic range) 5.2 0.0 - 7.0 %  POCT UA - Microalbumin  Result Value Ref Range   Microalbumin Ur, POC 150 mg/L   Creatinine, POC 300 mg/dL   Albumin/Creatinine Ratio, Urine, POC 30-300       Results for orders placed or performed in visit on 09/09/22  POCT glycosylated hemoglobin (Hb A1C)  Result Value Ref Range   Hemoglobin A1C     HbA1c POC (<> result, manual entry)     HbA1c, POC (prediabetic range)     HbA1c, POC (controlled diabetic range) 5.2 0.0 - 7.0 %   POCT UA - Microalbumin  Result Value Ref Range   Microalbumin Ur, POC 150 mg/L   Creatinine, POC 300 mg/dL   Albumin/Creatinine Ratio, Urine, POC 30-300       Assessment & Plan:  Marland KitchenMarland KitchenWaylon was seen today for follow-up and diabetes.  Diagnoses and all orders for this visit:  Type 2 diabetes mellitus without complication, without long-term current use of insulin (HCC) -     POCT glycosylated hemoglobin (Hb A1C) -     POCT UA - Microalbumin -     CBC w/Diff/Platelet -     Comprehensive metabolic panel -     Lipid panel  Flu vaccine need -     Flu Vaccine QUAD 58moIM (Fluarix, Fluzone & Alfiuria Quad PF)  Ingrown  toenail of right foot  Microalbuminuria  DDD (degenerative disc disease), lumbar -     predniSONE (DELTASONE) 50 MG tablet; Take one tablet by mouth daily for 5 days.   Will make referral for podiatry downstairs to handle ingrown toenail since I have done it once before.   A1C looks great Stay on same medications BP looks great Protein in urine-discussed farxiga to start Will check cmp Ordered labs On statin Foot and eye exam UTD Flu/pneumonia UTD Will get covid booster at pharmacy Follow up in 3 months  Prednisone burst given for low back pain flare Continue with muscle relaxers Continue with low back exercises and good posture   Return in about 3 months (around 12/09/2022), or if symptoms worsen or fail to improve.    Iran Planas, PA-C

## 2022-09-09 NOTE — Patient Instructions (Signed)
Podiatry for toenail removal  Dapagliflozin Tablets What is this medication? DAPAGLIFLOZIN (DAP a gli FLOE zin) treats type 2 diabetes. It works by helping your kidneys remove sugar (glucose) from your blood through the urine, which decreases your blood sugar. It can also be used to lower the risk of heart attack, stroke, kidney disease, and hospitalization for heart failure in people with type 2 diabetes. Changes to diet and exercise are often combined with this medication. This medicine may be used for other purposes; ask your health care provider or pharmacist if you have questions. COMMON BRAND NAME(S): Wilder Glade What should I tell my care team before I take this medication? They need to know if you have any of these conditions: Dehydration Diabetic ketoacidosis Diet low in salt Eating less due to illness, surgery, dieting, or any other reason Frequently drink alcohol Having surgery History of pancreatitis or pancreas problems History of yeast infection of the penis or vagina Infection in the bladder, kidneys, or urinary tract Kidney disease Low blood pressure On dialysis Problems urinating Type 1 diabetes Uncircumcised female An unusual or allergic reaction to dapagliflozin, other medications, foods, dyes, or preservatives Pregnant or trying to get pregnant Breast-feeding How should I use this medication? Take this medication by mouth with water. Take it as directed on the prescription label at the same time every day. You can take it with or without food. If it upsets your stomach, take it with food. Keep taking it unless your care team tells you to stop. A special MedGuide will be given to you by the pharmacist with each prescription and refill. Be sure to read this information carefully each time. Talk to your care team about the use of this medication in children. Special care may be needed. Overdosage: If you think you have taken too much of this medicine contact a poison control  center or emergency room at once. NOTE: This medicine is only for you. Do not share this medicine with others. What if I miss a dose? If you miss a dose, take it as soon as you can. If it is almost time for your next dose, take only that dose. Do not take double or extra doses. What may interact with this medication? Lithium Sulfonylureas, such as glimepiride, glipizide, glyburide This list may not describe all possible interactions. Give your health care provider a list of all the medicines, herbs, non-prescription drugs, or dietary supplements you use. Also tell them if you smoke, drink alcohol, or use illegal drugs. Some items may interact with your medicine. What should I watch for while using this medication? Visit your care team for regular checks on your progress. Tell your care team if your symptoms do not start to get better or if they get worse. This medication can cause a serious condition in which there is too much acid in the blood. If you develop nausea, vomiting, stomach pain, unusual tiredness, or breathing problems, stop taking this medication and call your care team right away. If possible, use a ketone dipstick to check for ketones in your urine. Check with your care team if you have severe diarrhea, nausea, and vomiting, or if you sweat a lot. The loss of too much body fluid may make it dangerous for you to take this medication. A test called the HbA1C (A1C) will be monitored. This is a simple blood test. It measures your blood sugar control over the last 2 to 3 months. You will receive this test every 3 to 6 months.  Learn how to check your blood sugar. Learn the symptoms of low and high blood sugar and how to manage them. Always carry a quick-source of sugar with you in case you have symptoms of low blood sugar. Examples include hard sugar candy or glucose tablets. Make sure others know that you can choke if you eat or drink when you develop serious symptoms of low blood sugar,  such as seizures or unconsciousness. Get medical help at once. Tell your care team if you have high blood sugar. You might need to change the dose of your medication. If you are sick or exercising more than usual, you may need to change the dose of your medication. Do not skip meals. Ask your care team if you should avoid alcohol. Many nonprescription cough and cold products contain sugar or alcohol. These can affect blood sugar. Wear a medical ID bracelet or chain. Carry a card that describes your condition. List the medications and doses you take on the card. What side effects may I notice from receiving this medication? Side effects that you should report to your care team as soon as possible: Allergic reactions--skin rash, itching, hives, swelling of the face, lips, tongue, or throat Dehydration--increased thirst, dry mouth, feeling faint or lightheaded, headache, dark yellow or brown urine Diabetic ketoacidosis (DKA)--increased thirst or amount of urine, dry mouth, fatigue, fruity odor to breath, trouble breathing, stomach pain, nausea, vomiting Genital yeast infection--redness, swelling, pain, or itchiness, odor, thick or lumpy discharge New pain or tenderness, change in skin color, sores or ulcers, infection of the leg or foot Infection or redness, swelling, tenderness, or pain in the genitals, or area from the genitals to the back of the rectum Urinary tract infection (UTI)--burning when passing urine, passing frequent small amounts of urine, bloody or cloudy urine, pain in the lower back or sides This list may not describe all possible side effects. Call your doctor for medical advice about side effects. You may report side effects to FDA at 1-800-FDA-1088. Where should I keep my medication? Keep out of the reach of children and pets. Store at room temperature between 20 and 25 degrees C (68 and 77 degrees F). Get rid of any unused medication after the expiration date. To get rid of  medications that are no longer needed or have expired: Take the medication to a medication take-back program. Check with your pharmacy or law enforcement to find a location. If you cannot return the medication, check the label or package insert to see if the medication should be thrown out in the garbage or flushed down the toilet. If you are not sure, ask your care team. If it is safe to put it in the trash, take the medication out of the container. Mix the medication with cat litter, dirt, coffee grounds, or other unwanted substance. Seal the mixture in a bag or container. Put it in the trash. NOTE: This sheet is a summary. It may not cover all possible information. If you have questions about this medicine, talk to your doctor, pharmacist, or health care provider.  2023 Elsevier/Gold Standard (2021-02-20 00:00:00)

## 2022-09-11 ENCOUNTER — Telehealth: Payer: Self-pay | Admitting: Pharmacist

## 2022-09-11 ENCOUNTER — Encounter: Payer: Self-pay | Admitting: Podiatry

## 2022-09-11 ENCOUNTER — Ambulatory Visit: Payer: No Typology Code available for payment source | Admitting: Podiatry

## 2022-09-11 DIAGNOSIS — L600 Ingrowing nail: Secondary | ICD-10-CM

## 2022-09-11 LAB — CBC WITH DIFFERENTIAL/PLATELET
Basophils Absolute: 0 10*3/uL (ref 0.0–0.2)
Basos: 0 %
EOS (ABSOLUTE): 0 10*3/uL (ref 0.0–0.4)
Eos: 0 %
Hematocrit: 42.4 % (ref 34.0–46.6)
Hemoglobin: 14.7 g/dL (ref 11.1–15.9)
Immature Grans (Abs): 0 10*3/uL (ref 0.0–0.1)
Immature Granulocytes: 0 %
Lymphocytes Absolute: 0.6 10*3/uL — ABNORMAL LOW (ref 0.7–3.1)
Lymphs: 6 %
MCH: 32.6 pg (ref 26.6–33.0)
MCHC: 34.7 g/dL (ref 31.5–35.7)
MCV: 94 fL (ref 79–97)
Monocytes Absolute: 0.1 10*3/uL (ref 0.1–0.9)
Monocytes: 1 %
Neutrophils Absolute: 10 10*3/uL — ABNORMAL HIGH (ref 1.4–7.0)
Neutrophils: 93 %
Platelets: 276 10*3/uL (ref 150–450)
RBC: 4.51 x10E6/uL (ref 3.77–5.28)
RDW: 12.3 % (ref 11.7–15.4)
WBC: 10.8 10*3/uL (ref 3.4–10.8)

## 2022-09-11 LAB — LIPID PANEL
Chol/HDL Ratio: 2.5 ratio (ref 0.0–4.4)
Cholesterol, Total: 100 mg/dL (ref 100–199)
HDL: 40 mg/dL (ref >39)
LDL Chol Calc (NIH): 41 mg/dL (ref 0–99)
Triglycerides: 103 mg/dL (ref 0–149)
VLDL Cholesterol Cal: 19 mg/dL (ref 5–40)

## 2022-09-11 LAB — COMPREHENSIVE METABOLIC PANEL
ALT: 31 IU/L (ref 0–32)
AST: 26 IU/L (ref 0–40)
Albumin/Globulin Ratio: 2.7 — ABNORMAL HIGH (ref 1.2–2.2)
Albumin: 4.9 g/dL (ref 3.9–4.9)
Alkaline Phosphatase: 78 IU/L (ref 44–121)
BUN/Creatinine Ratio: 15 (ref 9–23)
BUN: 13 mg/dL (ref 6–24)
Bilirubin Total: 0.7 mg/dL (ref 0.0–1.2)
CO2: 20 mmol/L (ref 20–29)
Calcium: 10.1 mg/dL (ref 8.7–10.2)
Chloride: 99 mmol/L (ref 96–106)
Creatinine, Ser: 0.89 mg/dL (ref 0.57–1.00)
Globulin, Total: 1.8 g/dL (ref 1.5–4.5)
Glucose: 257 mg/dL — ABNORMAL HIGH (ref 70–99)
Potassium: 4.7 mmol/L (ref 3.5–5.2)
Sodium: 138 mmol/L (ref 134–144)
Total Protein: 6.7 g/dL (ref 6.0–8.5)
eGFR: 80 mL/min/{1.73_m2} (ref >59)

## 2022-09-11 MED FILL — HYDROXYCHLOROQUINE 200 MG TABLET: ORAL | 30 days supply | Qty: 60 | Fill #2

## 2022-09-11 MED FILL — DICLOFENAC SODIUM 75 MG TABLET,DELAYED RELEASE: ORAL | 30 days supply | Qty: 60 | Fill #2

## 2022-09-11 NOTE — Progress Notes (Signed)
  Subjective:  Patient ID: Frances [name], female    DOB: 30-Jan-1976,   MRN: [id number]  Chief Complaint  Patient presents with   Ingrown Toenail     Right great toe ingrown - on going for 6 months     46 y.o. female presents for concern of right great ingrown that has been going on for 6 months. Relates her PCP has taken some of the nail out recently. Relates no discharge swelling or redness. She has had ingrown removed with Dr. Valentina Lucks in 2021  States she is not a diabetic.  Denies any other pedal complaints. Denies n/v/f/c.   Past Medical History:  Diagnosis Date   Anxiety    Arthritis    Back pain, chronic    Bipolar 1 disorder (HCC)    Chronic fatigue syndrome    DDD (degenerative disc disease), lumbar    Depression    Diabetes mellitus without complication ([place])    Fibromyalgia    IUD 2012   Mirena   MTHFR gene mutation     Objective:  Physical Exam: Vascular: DP/PT pulses 2/4 bilateral. CFT <3 seconds. Normal hair growth on digits. No edema.  Skin. No lacerations or abrasions bilateral feet. Right great toenail medial border incurvated. No erythema edema or purulence noted.  Musculoskeletal: MMT 5/5 bilateral lower extremities in DF, PF, Inversion and Eversion. Deceased ROM in DF of ankle joint.  Neurological: Sensation intact to light touch.   Assessment:  No diagnosis found.   Plan:  Patient was evaluated and treated and all questions answered. Patient requesting removal of ingrown nail today. Procedure below.  Discussed procedure and post procedure care and patient expressed understanding.  Will follow-up in 2 weeks for nail check or sooner if any problems arise.    Procedure:  Procedure: partial Nail Avulsion of right hallux medial nail border.  Surgeon: Lorenda Peck, DPM  Pre-op Dx: Ingrown toenail without infection Post-op: Same  Place of Surgery: Office exam room.  Indications for surgery: Painful and ingrown toenail.    The patient is  requesting removal of nail with chemical matrixectomy. Risks and complications were discussed with the patient for which they understand and written consent was obtained. Under sterile conditions a total of 3 mL of  1% lidocaine plain was infiltrated in a hallux block fashion. Once anesthetized, the skin was prepped in sterile fashion. A tourniquet was then applied. Next the medial aspect of hallux nail border was then sharply excised making sure to remove the entire offending nail border.  Next phenol was then applied under standard conditions and copiously irrigated. Silvadene was applied. A dry sterile dressing was applied. After application of the dressing the tourniquet was removed and there is found to be an immediate capillary refill time to the digit. The patient tolerated the procedure well without any complications. Post procedure instructions were discussed the patient for which he verbally understood. Follow-up in two weeks for nail check or sooner if any problems are to arise. Discussed signs/symptoms of infection and directed to call the office immediately should any occur or go directly to the emergency room. In the meantime, encouraged to call the office with any questions, concerns, changes symptoms.   Lorenda Peck, DPM

## 2022-09-11 NOTE — Telephone Encounter (Signed)
Patient Assistance Inquiry  Received notification from office staff that patient was inquiring about patient assistance status for ozempic, via novonordisk.  Will route to support staff to assist with contacting for status update.  After status is discovered, will get patient back on schedule with pharmacist care coordination.  Larinda Buttery, PharmD Clinical Pharmacist [facility] [phone]

## 2022-09-11 NOTE — Patient Instructions (Signed)

## 2022-09-12 NOTE — Progress Notes (Signed)
Frances [name],   LDL to goal.  Random sugar was really high but overall a1c looks good.  Watch high glucose/carb intake and sugar spikes.  Kidney and liver look good.  Hemoglobin and WBC look good.

## 2022-09-12 NOTE — Telephone Encounter (Signed)
Refaxed information to [employer] with confirmation received from the company that it was received.

## 2022-09-15 ENCOUNTER — Ambulatory Visit (INDEPENDENT_AMBULATORY_CARE_PROVIDER_SITE_OTHER): Payer: No Payment, Other | Admitting: Clinical

## 2022-09-15 DIAGNOSIS — F319 Bipolar disorder, unspecified: Secondary | ICD-10-CM

## 2022-09-16 ENCOUNTER — Ambulatory Visit
Admit: 2022-09-16 | Discharge: 2022-09-17 | Attending: Student in an Organized Health Care Education/Training Program | Primary: Student in an Organized Health Care Education/Training Program

## 2022-09-16 DIAGNOSIS — M797 Fibromyalgia: Principal | ICD-10-CM

## 2022-09-16 DIAGNOSIS — M0600 Rheumatoid arthritis without rheumatoid factor, unspecified site: Principal | ICD-10-CM

## 2022-09-16 DIAGNOSIS — M1990 Unspecified osteoarthritis, unspecified site: Principal | ICD-10-CM

## 2022-09-16 MED ORDER — PREGABALIN 75 MG CAPSULE
ORAL_CAPSULE | Freq: Two times a day (BID) | ORAL | 1 refills | 90 days | Status: CP
Start: 2022-09-16 — End: 2023-03-15
  Filled 2022-11-19: qty 180, 90d supply, fill #0

## 2022-09-19 ENCOUNTER — Telehealth (HOSPITAL_COMMUNITY): Payer: Self-pay | Admitting: *Deleted

## 2022-09-19 NOTE — Progress Notes (Signed)
THERAPIST PROGRESS NOTE Virtual Visit via Video Note  I connected with Frances [name] on 09/15/2022 at  3:00 PM EDT by a video enabled telemedicine application and verified that I am speaking with the correct person using two identifiers.  Location: Patient: home Provider: office   I discussed the limitations of evaluation and management by telemedicine and the availability of in person appointments. The patient expressed understanding and agreed to proceed.  Follow Up Instructions: I discussed the assessment and treatment plan with the patient. The patient was provided an opportunity to ask questions and all were answered. The patient agreed with the plan and demonstrated an understanding of the instructions.   The patient was advised to call back or seek an in-person evaluation if the symptoms worsen or if the condition fails to improve as anticipated.   Session Time: 20 minutes  Participation Level: Active  Behavioral Response: CasualAlertEuthymic  Type of Therapy: Individual Therapy  Treatment Goals addressed: Client will identify 3 cognitive patterns and believes stress for depression  ProgressTowards Goals: Progressing  Interventions: CBT and Supportive  Summary:  Frances [name] is a 46 y.o. female who presents for the scheduled appointment oriented x5, appropriately dressed, and friendly.  Client denied hallucinations and delusions. Client reported on today she is doing pretty well.  Client reported she has a new set of foster kittens which helps to keep her mind positively occupied.  Client reported she was able to get her medication of Prozac from the pharmacy since her last therapy appointment and that has been helping to keep her stable.  Client reported she decided to start taking the medication due to seasonal depression and anxiety issues. Client reported she mainly stays at home unless she leaves for a medical appointment. Client reported she has been  doing fair with the injections for her back but the doctors have considered that she have back surgery. Client reported otherwise she did cook for herself 1x and had left overs from it which she liked. Client reported she has been going to her additional support groups on Wednesday nights.  Client reported otherwise she received a denial from [employer] and is in the process of contacting lawyers to help her following appeal. Evidence of progress towards goal: Client reported she is improving her lifestyle changes by cooking at least once or twice per week to help improve her health.  Suicidal/Homicidal: Nowithout intent/plan  Therapist Response:  Therapist began the appointment asking client how she been doing since last seen. Therapist used CBT to engage using active listening and positive emotional support Therapist used CBT to ask the client about chronic pain and her coping to do daily activities. Therapist used CBT to discuss mindfulness techniques. Therapist used CBT ask the client to identify her progress with frequency of use with coping skills with continued practice in her daily activity.    Therapist assigned client homework to continue cooking at least once or twice per week and practicing self-care.   Plan: Return again in 4 weeks.  Diagnosis: Bipolar 1 disorder  Collaboration of Care: Patient refused AEB no other needs requested by the client at this time.  Patient/Guardian was advised Release of Information must be obtained prior to any record release in order to collaborate their care with an outside provider. Patient/Guardian was advised if they have not already done so to contact the registration department to sign all necessary forms in order for [place] to release information regarding their care.   Consent: Patient/Guardian  gives verbal consent for treatment and assignment of benefits for services provided during this visit. Patient/Guardian expressed understanding and  agreed to proceed.   [place], LCSW 09/15/2022

## 2022-09-19 NOTE — Telephone Encounter (Signed)
Left a VM fri pm for Dr Ronne Binning asking her to increase the dose of her strattera and send it to [facility] pharmacy. I am unsure if I understood the pharmacy, it is not a preferred pharmacy on her snapshot. Will forward this request to Dr Ronne Binning to address on Monday when she returns to the office.

## 2022-09-19 NOTE — Plan of Care (Signed)
  Problem: Depression CCP Problem  1  Goal: LTG: Vanity "[name]" WILL SCORE LESS THAN 10 ON THE PATIENT HEALTH QUESTIONNAIRE (PHQ-9) Outcome: Progressing Goal: STG: Frances "[name]" WILL IDENTIFY 3 COGNITIVE PATTERNS AND BELIEFS THAT SUPPORT DEPRESSION Outcome: Progressing   

## 2022-09-22 ENCOUNTER — Other Ambulatory Visit: Payer: Self-pay | Admitting: Physician Assistant

## 2022-09-22 ENCOUNTER — Other Ambulatory Visit (HOSPITAL_COMMUNITY): Payer: Self-pay | Admitting: Psychiatry

## 2022-09-22 DIAGNOSIS — M545 Low back pain, unspecified: Secondary | ICD-10-CM

## 2022-09-22 DIAGNOSIS — G8929 Other chronic pain: Secondary | ICD-10-CM

## 2022-09-22 DIAGNOSIS — M5136 Other intervertebral disc degeneration, lumbar region: Secondary | ICD-10-CM

## 2022-09-22 DIAGNOSIS — E1165 Type 2 diabetes mellitus with hyperglycemia: Secondary | ICD-10-CM

## 2022-09-22 DIAGNOSIS — M0609 Rheumatoid arthritis without rheumatoid factor, multiple sites: Secondary | ICD-10-CM

## 2022-09-22 MED ORDER — ATOMOXETINE HCL 80 MG PO CAPS: 80.0000 mg | ORAL_CAPSULE | Freq: Every day | ORAL | 3 refills | Status: DC

## 2022-09-22 NOTE — Telephone Encounter (Signed)
Strattera was recently increased on 08/28/2022. Patient notes that she is better able to focus. She informed Probation officer that she does not want a dose increase but rather a refill sent to her preferred pharmacy. Medication refilled and sent to  med assist. No other concerns noted at this time.

## 2022-09-23 MED ORDER — HYDROCODONE-ACETAMINOPHEN 5-325 MG PO TABS: 1.0000 | ORAL_TABLET | Freq: Two times a day (BID) | ORAL | 0 refills | Status: DC | PRN

## 2022-09-25 ENCOUNTER — Encounter: Payer: Self-pay | Admitting: Podiatrist

## 2022-09-25 ENCOUNTER — Ambulatory Visit (INDEPENDENT_AMBULATORY_CARE_PROVIDER_SITE_OTHER): Payer: No Typology Code available for payment source | Admitting: Physician Assistant

## 2022-09-25 ENCOUNTER — Ambulatory Visit (INDEPENDENT_AMBULATORY_CARE_PROVIDER_SITE_OTHER): Payer: No Typology Code available for payment source | Admitting: Podiatrist

## 2022-09-25 VITALS — BP 119/67 | HR 113 | Ht 67.0 in | Wt 168.0 lb

## 2022-09-25 DIAGNOSIS — G894 Chronic pain syndrome: Secondary | ICD-10-CM

## 2022-09-25 DIAGNOSIS — L600 Ingrowing nail: Secondary | ICD-10-CM

## 2022-09-25 DIAGNOSIS — K644 Residual hemorrhoidal skin tags: Secondary | ICD-10-CM

## 2022-09-25 DIAGNOSIS — Z9889 Other specified postprocedural states: Secondary | ICD-10-CM

## 2022-09-25 DIAGNOSIS — G8929 Other chronic pain: Secondary | ICD-10-CM

## 2022-09-25 DIAGNOSIS — M545 Low back pain, unspecified: Secondary | ICD-10-CM | POA: Insufficient documentation

## 2022-09-25 MED ORDER — KETOROLAC TROMETHAMINE 60 MG/2ML IM SOLN
60.0000 mg | Freq: Once | INTRAMUSCULAR | Status: AC
  Administered 2022-09-25: 60 mg via INTRAMUSCULAR

## 2022-09-25 MED ORDER — METHYLPREDNISOLONE ACETATE 80 MG/ML IJ SUSP
80.0000 mg | Freq: Once | INTRAMUSCULAR | Status: AC
  Administered 2022-09-25: 80 mg via INTRAMUSCULAR

## 2022-09-25 NOTE — Progress Notes (Incomplete)
Acute Office Visit  Subjective:     Patient ID: Frances [name], female    DOB: 19-Jul-1976, 46 y.o.   MRN: [id number]     HPI Patient is in today for worsening of chronic low back pain and growth around anus.   She has noticed the growth for at least 2 years but not had it looked at. It is becoming more itchy and bothersome. She wants it looked at.   She has known chronic low back pain   .Frances [name] Active Ambulatory Problems    Diagnosis Date Noted  . 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 DERMATITIS, ALLERGIC 06/05/2008  . DDD (degenerative disc disease), lumbar 02/10/2008  . Back pain 10/13/2008  . FIBROMYALGIA 02/10/2008  . IRON, SERUM, ELEVATED 02/10/2008  . Bipolar I disorder ([place]) 09/16/2017  . Chronic fatigue syndrome 09/16/2017  . MTHFR gene mutation 09/20/2017  . TMJ (temporomandibular joint syndrome) 09/26/2017  . New daily persistent headache 09/26/2017  . Rheumatoid arthritis 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 ([place]) 12/09/2017  . Anxiety 01/22/2018  . Borderline personality disorder ([place]) 01/22/2018  . Elevated fasting glucose 04/08/2018  . Decreased visual acuity 08/30/2018  . Right corneal abrasion 08/30/2018  . Toenail fungus 09/09/2019  . Vasovagal response 09/09/2019  . Grief reaction 11/28/2019  . Labral tear of hip, degenerative, right 01/11/2020  . DDD (degenerative disc disease), cervical 03/13/2020  . Ingrown toenail of both feet 03/13/2020  . Recurrent major depressive disorder, in partial remission ([place]) 05/24/2020  . Generalized anxiety disorder 05/24/2020  . Xiphoid pain 08/13/2020  . Sternum pain 08/13/2020  . Symptomatic mammary hypertrophy 09/04/2020  . Costochondritis 10/22/2020  . Rib pain on right side 10/22/2020  . Tachycardia 10/22/2020  . Bilateral leg edema 11/19/2020  . Seasonal allergies 12/25/2020  . Type 2 diabetes mellitus without complication, without long-term current use of insulin ([place]) 03/13/2021  .  Hyperlipidemia LDL goal <70 03/18/2021  . Attention deficit hyperactivity disorder (ADHD), 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  . Overweight (BMI 25.0-29.9) 06/18/2021  . SOB (shortness of breath) on exertion 08/20/2021  . ETD (Eustachian tube dysfunction), bilateral 08/23/2021  . Right lateral epicondylitis 12/31/2021  . Chronic right hip pain 01/06/2022  . Acute pain of right shoulder 01/06/2022  . Weak urine stream 03/17/2022  . Proteinuria 03/17/2022  . Acute left-sided low back pain without sciatica 04/07/2022  . DEGENERATIVE DISC DISEASE, LUMBAR SPINE 04/07/2022  . Acute midline low back pain without sciatica 04/07/2022  . Night sweats 06/18/2022  . Irritant contact dermatitis due to plants, except food 06/18/2022  . Ingrown toenail of right foot 09/09/2022  . Microalbuminuria 09/09/2022  . Chronic bilateral low back pain without sciatica 09/25/2022   Resolved Ambulatory Problems    Diagnosis Date Noted  . FEVER, RECURRENT 04/02/2009  . Hypothyroidism 02/10/2008  . SINUSITIS- ACUTE-NOS 03/24/2008  . URI 01/17/2009  . ANKLE PAIN, BILATERAL 07/28/2008  . FATIGUE 05/05/2008  . OPEN WOUND FT NO TOE ALONE WITHOUT MENTION COMP 03/28/2009  . DYSPNEA 10/15/2011  . Fibromyalgia 09/16/2017  . Depression   . Bipolar 1 disorder, depressed, severe ([place]) 01/25/2018  . Chronic right-sided low back pain without sciatica 01/11/2020  . Macromastia 06/29/2020  . Class 1 obesity due to excess calories without serious comorbidity with body mass index (BMI) of 32.0 to 32.9 in adult 08/13/2020  . Hepatomegaly 10/22/2020  . Chest congestion 11/20/2020  . Sinus drainage 11/20/2020  . Ear popping, bilateral 08/20/2021   Past Medical History:  Diagnosis Date  .  Arthritis   . Back pain, chronic   . Bipolar 1 disorder ([place])   . Diabetes mellitus without complication ([place])   . IUD 2012     ROS  See HPI>     Objective:    There were no vitals taken for this visit. BP Readings  from Last 3 Encounters:  09/25/22 119/67  09/09/22 114/79  07/20/22 117/78   Wt Readings from Last 3 Encounters:  09/25/22 168 lb 0.6 oz (76.2 kg)  09/09/22 167 lb (75.8 kg)  07/20/22 162 lb (73.5 kg)      Physical Exam Vitals reviewed.  HENT:     Head: Normocephalic.  Cardiovascular:     Rate and Rhythm: Normal rate.  Pulmonary:     Effort: Pulmonary effort is normal.     Breath sounds: Normal breath sounds.  Genitourinary:      Comments: Pedunculated small 6m skin tag Musculoskeletal:     Right lower leg: No edema.     Left lower leg: No edema.  Neurological:     General: No focal deficit present.     Mental Status: She is alert and oriented to person, place, and time.  Psychiatric:        Mood and Affect: Mood normal.    Skin Tag Removal Procedure Note Diagnosis: pruritic skin tags Location:  see picture to the left of anus Informed Consent: Discussed risks (permanent scarring, infection, pain, bleeding, bruising, redness, and recurrence of the lesion) and benefits of the procedure, as well as the alternatives. She is aware that skin tags are benign lesions, and their removal is often not considered medically necessary. Informed consent was obtained. Preparation: The area was prepared in a standard fashion. Anesthesia: not required Procedure Details: Iris scissors were used to perform sharp removal. Aluminum chloride was applied for hemostasis. Ointment and bandage were applied where needed. The patient tolerated the procedure well. Total number of lesions treated: 1 Plan: The patient was instructed on post-op care. Recommend OTC analgesia as needed for pain.        Assessment & Plan:  .Frances KitchenMarland KitchenDelmiwas seen today for back pain and anal lesion.  Diagnoses and all orders for this visit:  Chronic bilateral low back pain without sciatica -     methylPREDNISolone acetate (DEPO-MEDROL) injection 80 mg -     ketorolac (TORADOL) injection 60 mg  Skin tag of  anus  Chronic pain syndrome -     methylPREDNISolone acetate (DEPO-MEDROL) injection 80 mg -     ketorolac (TORADOL) injection 60 mg   Skin tag removed today without complication.   Depo medrol and toradol given today for low back pain flare Continue with muscle relaxer Continue with exer  JIran Planas PA-C

## 2022-09-25 NOTE — Progress Notes (Signed)
Chief Complaint  Patient presents with   Nail Problem    Nail check      HPI: Patient is 46 y.o. female who presents today for a nail check of the medial border of the right great toenail.  She relates it is doing well and overall she is pleased with the result.  She relates there is a small area of pinkish discoloration which she is wondering is if it is normal.   Allergies  Allergen Reactions   Metaxalone Hives    Review of systems is negative except as noted in the HPI.  Denies nausea/ vomiting/ fevers/ chills or night sweats.   Denies difficulty breathing, denies calf pain or tenderness  Physical Exam  Patient is awake, alert, and oriented x 3.  In no acute distress.    Vascular status is intact with palpable pedal pulses DP and PT bilateral and capillary refill time less than 3 seconds bilateral.  No edema or erythema noted.   Neurological exam reveals epicritic and protective sensation grossly intact bilateral.   Dermatological exam reveals skin is supple and dry to bilateral feet.  Right hallux medial nail border is healing well.  No redness, no swelling, no sign of infection is noted.  Normal appearance status post phenol matrixectomy as noted.  Musculoskeletal exam: Musculature intact with dorsiflexion, plantarflexion, inversion, eversion. Ankle and First MPJ joint range of motion normal.     Assessment:   ICD-10-CM   1. Ingrown right greater toenail  L60.0     2. Status post nail surgery  Z98.890        Plan: Discussed exam findings with the patient.  Discussed that the slight discoloration on the medial nail border is completely normal and is healing well.  Recommended keeping the area clean with soap and water and continuing to cover during the day and leave open at night.  Otherwise it will go on to heal uneventfully.  She will call if any problems arise in the future otherwise she will be seen back as needed for follow-up.

## 2022-09-25 NOTE — Progress Notes (Deleted)
   Acute Office Visit  Subjective:     Patient ID: Frances [name], female    DOB: 05/14/1976, 46 y.o.   MRN: [id number]    HPI Patient is in today for ***  2 years anus.   Back pain  ROS      Objective:    There were no vitals taken for this visit. BP Readings from Last 3 Encounters:  09/25/22 119/67  09/09/22 114/79  07/20/22 117/78   Wt Readings from Last 3 Encounters:  09/25/22 168 lb 0.6 oz (76.2 kg)  09/09/22 167 lb (75.8 kg)  07/20/22 162 lb (73.5 kg)      Physical Exam  No results found for any visits on 09/25/22.      Assessment & Plan:     Iran Planas, PA-C

## 2022-09-25 NOTE — Patient Instructions (Signed)
Skin Tag, Adult A skin tag (acrochordon) is a soft, extra growth of skin. Most skin tags are skin-colored and rarely bigger than a pencil eraser. They often form in areas where there is frequent rubbing, or friction, on the skin. This may be where there are folds in the skin, such as: The eyelids. The neck. The armpits. The groin. Skin tags are not dangerous, and they do not spread from person to person (are not contagious). You may have one skin tag or many. Skin tags do not need treatment. However, your health care provider may recommend removing a skin tag if it: Gets irritated from clothing or jewelry. Bleeds. Is visible and unsightly. What are the causes? This condition is linked to: Increasing age. Pregnancy. Diabetes. Obesity. What are the signs or symptoms? Skin tags usually do not cause symptoms unless they get irritated by items touching your skin, such as clothing or jewelry. When this happens, you may have pain, itching, or bleeding. How is this diagnosed? This condition is diagnosed with an evaluation from your health care provider. No testing is needed for diagnosis. How is this treated? Treatment for this condition depends on whether you have symptoms. Your health care provider may also remove your skin tag if it is visible or if you do not like the way it looks. A skin tag can be removed by a health care provider with: A simple surgical procedure using scissors. A procedure that involves freezing your skin tag with a gas in liquid form (liquid nitrogen). A procedure that uses heat to destroy your skin tag (electrodessication). Follow these instructions at home: Watch for any changes in your skin tag. A normal skin tag does not require any other special care at home. Take over-the-counter and prescription medicines only as told by your health care provider. Keep all follow-up visits. Contact a health care provider if: You have a skin tag that: Becomes painful. Changes  color. Bleeds. Swells. Summary Skin tags are soft, extra growths of skin found in areas of frequent rubbing or friction. Skin tags usually do not cause symptoms. If symptoms occur, you may have pain, itching, or bleeding. Your health care provider may remove your skin tag if it causes symptoms or if you do not like the way it looks. This information is not intended to replace advice given to you by your health care provider. Make sure you discuss any questions you have with your health care provider. Document Revised: 01/22/2022 Document Reviewed: 01/22/2022 Elsevier Patient Education  2023 Elsevier Inc.  

## 2022-09-29 ENCOUNTER — Encounter: Payer: Self-pay | Admitting: Physician Assistant

## 2022-09-29 NOTE — Progress Notes (Signed)
Acute Office Visit  Subjective:     Patient ID: Frances [name], female    DOB: 16-Jul-1976, 46 y.o.   MRN: [id number]     HPI Patient is in today for worsening of chronic low back pain and growth around anus.   She has noticed the growth for at least 2 years but not had it looked at. It is becoming more itchy and bothersome. She wants it looked at.   She has known chronic low back pain. She has frequent flares. This flare has been 2 days so fare. No known injury but when she does more around the house usually makes it worse. No red flags of saddle anesthesia, radicular pain, leg weakness. All bending, lifting, twisting makes back pain worse. 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    .. 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   DERMATITIS, ALLERGIC 06/05/2008   DDD (degenerative disc disease), lumbar 02/10/2008   Back pain 10/13/2008   FIBROMYALGIA 02/10/2008   IRON, SERUM, ELEVATED 02/10/2008   Bipolar I disorder ([place]) 09/16/2017   Chronic fatigue syndrome 09/16/2017   MTHFR gene mutation 09/20/2017   TMJ (temporomandibular joint syndrome) 09/26/2017   New daily persistent headache 09/26/2017   Rheumatoid arthritis 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 ([place]) 12/09/2017   Anxiety 01/22/2018   Borderline personality disorder ([place]) 01/22/2018   Elevated fasting glucose 04/08/2018   Decreased visual acuity 08/30/2018   Right corneal abrasion 08/30/2018   Toenail fungus 09/09/2019   Vasovagal response 09/09/2019   Grief reaction 11/28/2019   Labral tear of hip, degenerative, right 01/11/2020   DDD (degenerative disc disease), cervical 03/13/2020   Ingrown toenail of both feet 03/13/2020   Recurrent major depressive disorder, in partial remission ([place]) 05/24/2020   Generalized anxiety disorder 05/24/2020   Xiphoid pain 08/13/2020   Sternum pain 08/13/2020   Symptomatic mammary  hypertrophy 09/04/2020   Costochondritis 10/22/2020   Rib pain on right side 10/22/2020   Tachycardia 10/22/2020   Bilateral leg edema 11/19/2020   Seasonal allergies 12/25/2020   Type 2 diabetes mellitus without complication, without long-term current use of insulin ([place]) 03/13/2021   Hyperlipidemia LDL goal <70 03/18/2021   Attention deficit hyperactivity disorder (ADHD), 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   Overweight (BMI 25.0-29.9) 06/18/2021   SOB (shortness of breath) on exertion 08/20/2021   ETD (Eustachian tube dysfunction), bilateral 08/23/2021   Right lateral epicondylitis 12/31/2021   Chronic right hip pain 01/06/2022   Acute pain of right shoulder 01/06/2022   Weak urine stream 03/17/2022   Proteinuria 03/17/2022   Acute left-sided low back pain without sciatica 04/07/2022   DEGENERATIVE DISC DISEASE, LUMBAR SPINE 04/07/2022   Acute midline low back pain without sciatica 04/07/2022   Night sweats 06/18/2022   Irritant contact dermatitis due to plants, except food 06/18/2022   Ingrown toenail of right foot 09/09/2022   Microalbuminuria 09/09/2022   Chronic bilateral low back pain without sciatica 09/25/2022   Resolved Ambulatory Problems    Diagnosis Date Noted   FEVER, RECURRENT 04/02/2009   Hypothyroidism 02/10/2008   SINUSITIS- ACUTE-NOS 03/24/2008   URI 01/17/2009   ANKLE PAIN, BILATERAL 07/28/2008   FATIGUE 05/05/2008   OPEN WOUND FT NO TOE ALONE WITHOUT MENTION COMP 03/28/2009   DYSPNEA 10/15/2011   Fibromyalgia 09/16/2017   Depression    Bipolar 1 disorder, depressed, severe ([place]) 01/25/2018   Chronic right-sided low back  pain without sciatica 01/11/2020   Macromastia 06/29/2020   Class 1 obesity due to excess calories without serious comorbidity with body mass index (BMI) of 32.0 to 32.9 in adult 08/13/2020   Hepatomegaly 10/22/2020   Chest congestion 11/20/2020   Sinus drainage 11/20/2020   Ear popping, bilateral 08/20/2021   Past Medical  History:  Diagnosis Date   Arthritis    Back pain, chronic    Bipolar 1 disorder (HCC)    Diabetes mellitus without complication ([place])    IUD 2012     ROS  See HPI>     Objective:    BP 119/67   Pulse (!) 113   Ht '5\' 7"'$  (1.702 m)   Wt 168 lb 0.6 oz (76.2 kg)   SpO2 99%   BMI 26.32 kg/m  BP Readings from Last 3 Encounters:  09/25/22 119/67  09/09/22 114/79  07/20/22 117/78   Wt Readings from Last 3 Encounters:  09/25/22 168 lb 0.6 oz (76.2 kg)  09/09/22 167 lb (75.8 kg)  07/20/22 162 lb (73.5 kg)      Physical Exam Vitals reviewed.  HENT:     Head: Normocephalic.  Cardiovascular:     Rate and Rhythm: Normal rate.  Pulmonary:     Effort: Pulmonary effort is normal.     Breath sounds: Normal breath sounds.  Genitourinary:      Comments: Pedunculated small 22m skin tag Musculoskeletal:     Right lower leg: No edema.     Left lower leg: No edema.  Neurological:     General: No focal deficit present.     Mental Status: She is alert and oriented to person, place, and time.  Psychiatric:        Mood and Affect: Mood normal.    Skin Tag Removal Procedure Note Diagnosis: pruritic skin tags Location:  see picture to the left of anus Informed Consent: Discussed risks (permanent scarring, infection, pain, bleeding, bruising, redness, and recurrence of the lesion) and benefits of the procedure, as well as the alternatives. She is aware that skin tags are benign lesions, and their removal is often not considered medically necessary. Informed consent was obtained. Preparation: The area was prepared in a standard fashion. Anesthesia: not required Procedure Details: Iris scissors were used to perform sharp removal. Aluminum chloride was applied for hemostasis. Ointment and bandage were applied where needed. The patient tolerated the procedure well. Total number of lesions treated: 1 Plan: The patient was instructed on post-op care. Recommend OTC analgesia as needed for  pain.        Assessment & Plan:  .Marland KitchenMarland KitchenKieannawas seen today for back pain and anal lesion.  Diagnoses and all orders for this visit:  Chronic bilateral low back pain without sciatica -     methylPREDNISolone acetate (DEPO-MEDROL) injection 80 mg -     ketorolac (TORADOL) injection 60 mg  Skin tag of anus  Chronic pain syndrome -     methylPREDNISolone acetate (DEPO-MEDROL) injection 80 mg -     ketorolac (TORADOL) injection 60 mg   Skin tag removed today without complication.   Depo medrol and toradol given today for low back pain flare Continue with muscle relaxer Continue with exercises and good lifting techniques Continue with diclofenac/norco/lyrica/cymbalta for pain management Letter written for disability. 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

## 2022-10-01 ENCOUNTER — Encounter: Payer: Self-pay | Admitting: Sports Medicine

## 2022-10-01 NOTE — Telephone Encounter (Signed)
This might need to go to [place], looks like she wrote the previous letter for complete disability from her medical/psychiatric issues, from an orthopedic/sports medicine standpoint I think she CAN work a sedentary job.

## 2022-10-03 ENCOUNTER — Telehealth: Payer: Self-pay | Admitting: Physician Assistant

## 2022-10-03 NOTE — Telephone Encounter (Signed)
Patient called in reference to an application that was submitted for her ozempic she is out and is having trouble getting thru to someone. She ask if you would to please call her. Thank you

## 2022-10-06 ENCOUNTER — Ambulatory Visit (INDEPENDENT_AMBULATORY_CARE_PROVIDER_SITE_OTHER): Payer: No Payment, Other | Admitting: Clinical

## 2022-10-06 DIAGNOSIS — F319 Bipolar disorder, unspecified: Secondary | ICD-10-CM | POA: Diagnosis not present

## 2022-10-06 NOTE — Progress Notes (Signed)
THERAPIST PROGRESS NOTE Virtual Visit via Video Note  I connected with Frances [name] on 10/06/2022 at 10:00 AM EDT by a video enabled telemedicine application and verified that I am speaking with the correct person using two identifiers.  Location: Patient: home Provider: office   I discussed the limitations of evaluation and management by telemedicine and the availability of in person appointments. The patient expressed understanding and agreed to proceed.  Follow Up Instructions: I discussed the assessment and treatment plan with the patient. The patient was provided an opportunity to ask questions and all were answered. The patient agreed with the plan and demonstrated an understanding of the instructions.   The patient was advised to call back or seek an in-person evaluation if the symptoms worsen or if the condition fails to improve as anticipated.   Session Time: 40 minutes  Participation Level: Active  Behavioral Response: CasualAlertEuthymic  Type of Therapy: Individual Therapy  Treatment Goals addressed: client will identify 3 cognitive patterns and beliefs that support depression  ProgressTowards Goals: Progressing  Interventions: CBT  Summary:  Frances [name] is a 46 y.o. female who presents for the scheduled appointment oriented x5, appropriately dressed, and friendly.  Client denied hallucinations and delusions. Client reported on today she is doing fairly well.  Client reported she has found herself being busy doing little things around the house. Client reported she is also has been spending some time socially with her friends.  Client reported fostering kittens has also been a positive outlook. Client reported she has been feeling symptoms of the seasonal depression coming back. Client reported 1 indicator of this is having thoughts of "why am I even here".  Client reported is not related to thoughts or intent to harm herself because of his lack of  motivation. Client reported she usually has a morning routine that helps to get her going but once she has eaten, taking her medication, and taking care of her pets is pretty much "what ever" for the rest of the day. Client reported she attends the support group through [employer] once per week but will consider going 2 times per week for additional support during the wintertime. Client reported she has not made any other home-cooked meals since her last appointment. Client reported things that she likes is very limited. Client reported she still has ongoing pain and will be referred to specialist. Evidence of progress towards goal:   client reported being consistent with morning routine 7 days per week. Client reported 1 positive skill of distracting herself from passive suicidal thoughts.   Suicidal/Homicidal: Nowithout intent/plan  Therapist Response:  Therapist began the appointment asking client how she has been doing since last seen. Therapist used CBT to engage using active listening and positive emotional support. Therapist used CBT to ask the client open-ended questions about the severity of her depressive symptoms towards the end of the year. Therapist used CBT to discuss early indicators and asked the client to identify changes she has noticed in her thoughts or behaviors. Therapist used CBT to reinforce the use of routine, planned activities, and social support to help with depressive symptoms. Therapist used CBT ask the client to identify her progress with frequency of use with coping skills with continued practice in her daily activity.    Therapist assigned the client to practice self care.    Plan: Return again in 3 weeks.  Diagnosis: bipolar 1 disorder  Collaboration of Care: Patient refused AEB none requested by the client  at this time.  Patient/Guardian was advised Release of Information must be obtained prior to any record release in order to collaborate their care  with an outside provider. Patient/Guardian was advised if they have not already done so to contact the registration department to sign all necessary forms in order for [place] to release information regarding their care.   Consent: Patient/Guardian gives verbal consent for treatment and assignment of benefits for services provided during this visit. Patient/Guardian expressed understanding and agreed to proceed.   [place], LCSW 10/06/2022

## 2022-10-06 NOTE — Telephone Encounter (Signed)
Patient Assistance Inquiry   Received notification from office staff that patient was inquiring about patient assistance status for ozempic, via nov[employer]ordisk.   Application was refaxed 09/12/22.   Will route to support staff to assist with contacting for status update after application has been resent.   After status is discovered, will get patient back on schedule with pharmacist care coordination.  Larinda Buttery, PharmD Clinical Pharmacist [facility] [phone]

## 2022-10-08 ENCOUNTER — Ambulatory Visit (INDEPENDENT_AMBULATORY_CARE_PROVIDER_SITE_OTHER): Payer: No Typology Code available for payment source | Admitting: Sports Medicine

## 2022-10-08 ENCOUNTER — Encounter: Payer: Self-pay | Admitting: Sports Medicine

## 2022-10-08 DIAGNOSIS — M48061 Spinal stenosis, lumbar region without neurogenic claudication: Secondary | ICD-10-CM

## 2022-10-08 DIAGNOSIS — S4491XA Injury of unspecified nerve at shoulder and upper arm level, right arm, initial encounter: Secondary | ICD-10-CM

## 2022-10-08 NOTE — Progress Notes (Addendum)
    Procedures performed today:    None.  Independent interpretation of notes and tests performed by another provider:   None.  Brief History, Exam, Impression, and Recommendations:    Neuropraxia of right thumb Frances [name] returns, she is a 46 year old female, she was doing some cutting with scissors to shape pad for her horses hooves, afterwards she noted some numbness in her thumb, subjective, she has grossly adequate sensation on exam, good motion, good strength in all directions. Negative Tinel's and Phalen signs. Her cervical spine MRI is almost completely normal. I think this is a temporary neuropraxia of her thumb and I advised her we should watch this for 6 to 12 weeks before making any interventions or further evaluation such as nerve conduction and EMG.  Lumbar spinal stenosis Frances [name] does have multilevel lumbar spinal stenosis worst at the L3-L4 level, severe based on my interpretation of an MRI from last year. She has had multiple interventions including lumbar epidurals, as well as facet injections, medial branch blocks and ablations. She does have a spine surgeon who is unfortunately retiring. At this point I do think she would be a candidate for a laminectomy, however she needs another spine surgeon, there is no one else in the [facility] health system, and she cannot see anyone in the private neurosurgery or spine surgery groups here, but she is able to work with Festus Aloe, she will find a Pharmacologist and let me know who to do the referral to. She did ask for a letter for complete disability, she is able to go out and ride her horse so I am frankly unable to write a letter allowing her complete disability but may be more of a sedentary job.  I spent 30 minutes of total time managing this patient today, this includes chart review, face to face, and non-face to face time.  ____________________________________________ Gwen Her. Dianah Field, M.D., ABFM., CAQSM., AME. Primary Care and  Sports Medicine [facility]  Adjunct Professor of [employer]  Risk manager

## 2022-10-08 NOTE — Assessment & Plan Note (Addendum)
[name] does have multilevel lumbar spinal stenosis worst at the L3-L4 level, severe based on my interpretation of an MRI from last year. She has had multiple interventions including lumbar epidurals, as well as facet injections, medial branch blocks and ablations. She does have a spine surgeon who is unfortunately retiring. At this point I do think she would be a candidate for a laminectomy, however she needs another spine surgeon, there is no one else in the [facility] health system, and she cannot see anyone in the private neurosurgery or spine surgery groups here, but she is able to work with Festus Aloe, she will find a Pharmacologist and let me know who to do the referral to. She did ask for a letter for complete disability, she is able to go out and ride her horse so I am frankly unable to write a letter allowing her complete disability but may be more of a sedentary job.

## 2022-10-08 NOTE — Assessment & Plan Note (Signed)
Frances [name] returns, she is a 46 year old female, she was doing some cutting with scissors to shape pad for her horses hooves, afterwards she noted some numbness in her thumb, subjective, she has grossly adequate sensation on exam, good motion, good strength in all directions. Negative Tinel's and Phalen signs. Her cervical spine MRI is almost completely normal. I think this is a temporary neuropraxia of her thumb and I advised her we should watch this for 6 to 12 weeks before making any interventions or further evaluation such as nerve conduction and EMG.

## 2022-10-09 ENCOUNTER — Encounter: Payer: Self-pay | Admitting: Sports Medicine

## 2022-10-09 DIAGNOSIS — M5136 Other intervertebral disc degeneration, lumbar region: Principal | ICD-10-CM

## 2022-10-09 DIAGNOSIS — M797 Fibromyalgia: Principal | ICD-10-CM

## 2022-10-09 MED ORDER — BACLOFEN 10 MG TABLET
ORAL_TABLET | Freq: Two times a day (BID) | ORAL | 0 refills | 30 days | Status: CP | PRN
Start: 2022-10-09 — End: 2023-10-09
  Filled 2022-10-14: qty 60, 30d supply, fill #0

## 2022-10-10 ENCOUNTER — Telehealth (HOSPITAL_COMMUNITY): Payer: Self-pay | Admitting: Psychiatry

## 2022-10-11 NOTE — Plan of Care (Signed)
  Problem: Depression CCP Problem  1  Goal: LTG: Frances "[name]" WILL SCORE LESS THAN 10 ON THE PATIENT HEALTH QUESTIONNAIRE (PHQ-9) Outcome: Progressing Goal: STG: Frances "[name]" WILL IDENTIFY 3 COGNITIVE PATTERNS AND BELIEFS THAT SUPPORT DEPRESSION Outcome: Progressing   

## 2022-10-13 ENCOUNTER — Encounter (HOSPITAL_COMMUNITY): Payer: Self-pay | Admitting: Psychiatry

## 2022-10-13 NOTE — Telephone Encounter (Signed)
Patient informed Probation officer that she has difficulties maintaining her job and believes that she cannot work.  She requested Probation officer write a letter stating that she cannot work.  Provider informed patient that she could not document that she is not capable of working but did let her know that the letter could be written detailing her mental health and her symptoms that may make it difficult for her to work.  She endorsed understanding and agreed.  Letter written and signed.  Patient made aware.  No other concerns at this time.

## 2022-10-14 MED FILL — DULOXETINE 30 MG CAPSULE,DELAYED RELEASE: ORAL | 30 days supply | Qty: 90 | Fill #1

## 2022-10-14 MED FILL — HYDROXYCHLOROQUINE 200 MG TABLET: ORAL | 30 days supply | Qty: 60 | Fill #3

## 2022-10-15 DIAGNOSIS — M0600 Rheumatoid arthritis without rheumatoid factor, unspecified site: Principal | ICD-10-CM

## 2022-10-15 MED ORDER — DICLOFENAC SODIUM 75 MG TABLET,DELAYED RELEASE
ORAL_TABLET | Freq: Two times a day (BID) | ORAL | 2 refills | 30 days | Status: CP
Start: 2022-10-15 — End: 2023-01-13
  Filled 2022-10-21: qty 60, 30d supply, fill #0

## 2022-10-16 ENCOUNTER — Encounter: Payer: Self-pay | Admitting: Orthopedic Surgery

## 2022-10-16 ENCOUNTER — Ambulatory Visit (INDEPENDENT_AMBULATORY_CARE_PROVIDER_SITE_OTHER): Payer: Self-pay | Admitting: Orthopedic Surgery

## 2022-10-16 ENCOUNTER — Telehealth (HOSPITAL_COMMUNITY): Payer: Self-pay | Admitting: *Deleted

## 2022-10-16 ENCOUNTER — Ambulatory Visit (INDEPENDENT_AMBULATORY_CARE_PROVIDER_SITE_OTHER): Payer: Self-pay

## 2022-10-16 VITALS — BP 114/82 | HR 94 | Ht 67.0 in | Wt 166.0 lb

## 2022-10-16 DIAGNOSIS — M47816 Spondylosis without myelopathy or radiculopathy, lumbar region: Secondary | ICD-10-CM

## 2022-10-16 DIAGNOSIS — M5137 Other intervertebral disc degeneration, lumbosacral region: Secondary | ICD-10-CM

## 2022-10-16 DIAGNOSIS — M4807 Spinal stenosis, lumbosacral region: Secondary | ICD-10-CM

## 2022-10-16 NOTE — Progress Notes (Addendum)
Orthopedic Spine Surgery Office Note  Assessment: Patient is a 46 y.o. female with low back pain that seems to be coming from facet arthropathy   Plan: -Patient has tried multiple treatments over the years including physical therapy, NSAIDs, Vicodin, steroid injections, oral steroids, ablation, and gets temporary relief of her pain with these treatment modalities.  She states that she has not been doing any regular home exercises but is been through physical therapy several times.  I offered her referral to physical therapy to work on core strengthening but she wanted to restart with the home exercises that she already has. I told her that if she needs a referral to pain management, I can provide her with that as well. She was not interested in that referral at this time.  -She inquired about a decompression surgery.  I explained that she does have lateral recess stenosis but is not having any leg symptoms so I would not recommend surgery at this time. -She can return on an as-needed basis   Patient expressed understanding of the plan and all questions were answered to the patient's satisfaction.   ___________________________________________________________________________   History:  Patient is a 46 y.o. female who presents today for lumbar spine.  Patient has a several year history of low back pain.  She states in the past it did radiate down her left leg along the posterior aspect.  However, that pain has now resolved.  All of her pain now is located at her lower lumbar spine.  It sometimes radiates into the immediately adjacent paraspinal muscles.  She states that it is worse if she is standing for a long time or extends her back and gets better if she sits down or flexes her lumbar spine.  She has no pain radiating into her legs at this time.  There is no trauma or injury that brought on the pain.  She states that her pain comes and flares of intensity.  Today, it is not as bad as it is at  other times.   Weakness: Denies Symptoms of imbalance: Denies Paresthesias and numbness: Denies Bowel or bladder incontinence: Denies Saddle anesthesia: Denies  Treatments tried: Vicodin, oral steroids, steroid injection, NSAIDs, physical therapy, home exercises, ablation  Review of systems: Denies fevers and chills, night sweats, unexplained weight loss, history of cancer, pain that wakes them at night  Past medical history: Hyperlipidemia Depression Anxiety Fibromyalgia Diabetes Rheumatoid arthritis Chronic pain  Allergies: Metaxalone  Past surgical history:  Left knee lateral release Left ankle removal of scar tissue Tonsillectomy  Social history: Denies use of nicotine product (smoking, vaping, patches, smokeless) Alcohol use: Denies Denies recreational drug use   Physical Exam:  General: no acute distress, appears stated age Neurologic: alert, answering questions appropriately, following commands Respiratory: unlabored breathing on room air, symmetric chest rise Psychiatric: appropriate affect, normal cadence to speech   MSK (spine):  -Strength exam      Left  Right EHL    5/5  5/5 TA    5/5  5/5 GSC    5/5  5/5 Knee extension  5/5  5/5 Hip flexion   5/5  5/5  -Sensory exam    Sensation intact to light touch in L3-S1 nerve distributions of bilateral lower extremities  -Achilles DTR: 2/4 on the left, 2/4 on the right -Patellar tendon DTR: 2/4 on the left, 2/4 on the right  -Straight leg raise: Negative -Contralateral straight leg raise: Negative -Clonus: no beats bilaterally  -Left hip exam: No pain  through range of motion, negative Stinchfield, negative Corky Sox -Right hip exam: No pain through range of motion, negative Stinchfield, negative Faber  Imaging: XR of the lumbar spine from 10/16/2022 was independently reviewed and interpreted, showing disc height loss and retrolisthesis at L4/5. Disc height loss at L5/S1. Spondylolisthesis at L3/4.  Measures about 2.46m on flexion, reduces on extension.  Lower lumbar spine facet arthropathy.   Patient name: Frances SANDEFURPatient MRN: [id number] of visit: 10/16/22

## 2022-10-16 NOTE — Telephone Encounter (Signed)
Provider printed the letter.  Patient can pick it up at her convenience.  Provider left a voicemail detailing this.

## 2022-10-16 NOTE — Telephone Encounter (Signed)
Patient called & asked if you could print letter you wrote for her attorney.  Stated she see's on my chart but she's unable to print from there

## 2022-10-17 ENCOUNTER — Telehealth: Payer: Self-pay | Admitting: Physician Assistant

## 2022-10-17 NOTE — Telephone Encounter (Signed)
Received Ozempic 0.25 / 0.5 5 boxes on 10/17/22 at 9:30. I called patient and left a voicemail for patient to pick up medication. I am placing in fridge. - CF

## 2022-10-19 ENCOUNTER — Encounter: Payer: Self-pay | Admitting: Emergency Medicine

## 2022-10-19 ENCOUNTER — Ambulatory Visit (INDEPENDENT_AMBULATORY_CARE_PROVIDER_SITE_OTHER): Payer: Self-pay

## 2022-10-19 ENCOUNTER — Ambulatory Visit
Admission: EM | Admit: 2022-10-19 | Discharge: 2022-10-19 | Disposition: A | Discharge status: Home / Self Care | Payer: Self-pay | Attending: Family Medicine | Admitting: Family Medicine

## 2022-10-19 DIAGNOSIS — S20211A Contusion of right front wall of thorax, initial encounter: Secondary | ICD-10-CM

## 2022-10-19 DIAGNOSIS — R0789 Other chest pain: Secondary | ICD-10-CM

## 2022-10-19 MED ORDER — DICLOFENAC SODIUM 75 MG PO TBEC: 75.0000 mg | DELAYED_RELEASE_TABLET | Freq: Two times a day (BID) | ORAL | 0 refills | Status: AC

## 2022-10-19 NOTE — ED Triage Notes (Signed)
[place] off her pony on Tuesday  Hit the pony's neck w/ her right rib cage  Hurts to take a deep breath  Out of NSAIDs for chronic back pain  Diclofenac helped rib pain  No pain meds this am

## 2022-10-19 NOTE — Discharge Instructions (Addendum)
Take the Voltaren 2 times a day with food Take hydrocodone if needed in addition See your doctor if not improving in a week

## 2022-10-19 NOTE — ED Provider Notes (Signed)
[name]    CSN: [id number] Arrival date & time: 10/19/22  0950      History   Chief Complaint Chief Complaint  Patient presents with   Rib Injury    right    HPI Frances [name] is a 46 y.o. female.   HPI Patient has horses.  She was riding her horse and it behaved unpredictably.  She fell off the horse and her rib cage hit across the bridge of the ponies neck.  She had rib pain ever since then.  Pain with deep breath.  No cough or fever.  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   Chronic bilateral low back pain without sciatica 09/25/2022   Microalbuminuria 09/09/2022   Night sweats 06/18/2022   DEGENERATIVE [place] DISEASE, LUMBAR SPINE 04/07/2022   Proteinuria 03/17/2022   Chronic right hip pain 01/06/2022   ETD (Eustachian tube dysfunction), bilateral 08/23/2021   SOB (shortness of breath) on exertion 08/20/2021   Overweight (BMI 25.0-29.9) 06/18/2021   Attention deficit hyperactivity disorder (ADHD), 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   Hyperlipidemia LDL goal <70 03/18/2021   Type 2 diabetes mellitus without complication, without long-term current use of insulin ([place]) 03/13/2021   Seasonal allergies 12/25/2020   Bilateral leg edema 11/19/2020   Symptomatic mammary hypertrophy 09/04/2020   Recurrent major depressive disorder, in partial remission ([place]) 05/24/2020   Generalized anxiety disorder 05/24/2020   DDD (degenerative disc disease), cervical 03/13/2020   Ingrown toenail of both feet 03/13/2020   Labral tear of hip, degenerative, right 01/11/2020   Grief reaction  11/28/2019   Elevated fasting glucose 04/08/2018   Borderline personality disorder ([place]) 01/22/2018   Rheumatoid arthritis 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 ([place]) 12/09/2017   New daily persistent headache 09/26/2017   MTHFR gene mutation 09/20/2017   Bipolar I disorder ([place]) 09/16/2017   Chronic fatigue syndrome 09/16/2017   Back pain 10/13/2008   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   Lumbar spinal stenosis 02/10/2008   FIBROMYALGIA 02/10/2008   IRON, SERUM, ELEVATED 02/10/2008    Past Surgical History:  Procedure Laterality Date   COLONOSCOPY WITH PROPOFOL N/A 02/25/2017   Procedure: COLONOSCOPY WITH PROPOFOL;  Surgeon: Arta Silence, MD;  Location: [facility];  Service: Endoscopy;  Laterality: N/A;   left knee lateral release  1993   scar tisue removal  03/2005   left ankle   TONSILLECTOMY  age 55's    OB History     Gravida  0   Para  0   Term  0   Preterm  0   AB  0   Living  0      SAB  0   IAB  0   Ectopic  0   Multiple  0   Live Births               Home Medications    Prior to Admission medications   Medication Sig Start Date End Date Taking? Authorizing Provider  baclofen (LIORESAL) 10 MG tablet Take  by mouth. 10/09/22  Yes [provider]  albuterol (VENTOLIN HFA) 108 (90 Base) MCG/ACT inhaler Take 2 puffs 15 minutes before exercise and as needed for shortness of breath. Patient not taking: Reported on 10/19/2022 08/27/21   Donella Stade, PA-C  ARIPiprazole (ABILIFY) 15 MG tablet Take 1 tablet (15 mg total) by mouth daily. 08/28/22   Salley Slaughter, NP  atomoxetine (STRATTERA) 80 MG capsule Take 1 capsule (80 mg total) by mouth daily. 09/22/22   Salley Slaughter, NP  atorvastatin (LIPITOR) 20 MG tablet Take 1 tablet (20 mg total) by mouth daily. 07/08/22   Breeback, Jade L, PA-C  Azelastine HCl 137 MCG/SPRAY SOLN USE 2 SPRAYS INTO ALERNATE NOSTRILS TWICE DAILY 07/17/22   Breeback, Jade L, PA-C  benztropine  (COGENTIN) 0.5 MG tablet Take 1 tablet (0.5 mg total) by mouth daily. 08/28/22   Salley Slaughter, NP  blood glucose meter kit and supplies KIT Dispense based on patient and insurance preference. Use up to four times daily as directed. 05/29/21   Breeback, Jade L, PA-C  buPROPion (WELLBUTRIN XL) 150 MG 24 hr tablet Take 1 tablet (150 mg total) by mouth every morning. 08/28/22   Salley Slaughter, NP  buPROPion (WELLBUTRIN XL) 300 MG 24 hr tablet Take 1 tablet (300 mg total) by mouth daily. 08/28/22   Salley Slaughter, NP  cholecalciferol (VITAMIN D3) 25 MCG (1000 UT) tablet Take 1,000 Units by mouth daily.    [provider]  diclofenac (VOLTAREN) 75 MG EC tablet Take 1 tablet (75 mg total) by mouth 2 (two) times daily. 10/19/22   Raylene Everts, MD  DULoxetine HCl 30 MG CSDR Take 90 mg by mouth daily.    [provider]  fluticasone (FLONASE) 50 MCG/ACT nasal spray USE 2 SPRAYS IN EACH NOSTRIL EVERY DAY 09/23/22   Breeback, Jade L, PA-C  HYDROcodone-acetaminophen (NORCO/VICODIN) 5-325 MG tablet Take 1 tablet by mouth 2 (two) times daily as needed for moderate pain (back pain). 09/23/22   Breeback, Royetta Car, PA-C  hydroxychloroquine (PLAQUENIL) 200 MG tablet Take 200 mg by mouth 2 (two) times daily. 11/21/21 11/21/22  [provider]  levocetirizine (XYZAL) 5 MG tablet Take 1 tablet (5 mg total) by mouth every evening. 06/18/22   Breeback, Jade L, PA-C  metFORMIN (GLUCOPHAGE) 1000 MG tablet TAKE 1 Tablet  BY MOUTH TWICE DAILY WITH A MEAL 09/23/22   Breeback, Jade L, PA-C  montelukast (SINGULAIR) 10 MG tablet TAKE 1 Tablet BY MOUTH ONCE EVERY NIGHT AT BEDTIME 09/23/22   Breeback, Jade L, PA-C  pregabalin (LYRICA) 75 MG capsule TAKE 1 CAPSULE BY MOUTH TWICE A DAY 08/22/22   Silverio Decamp, MD  Semaglutide,0.25 or 0.5MG/DOS, (OZEMPIC, 0.25 OR 0.5 MG/DOSE,) 2 MG/1.5ML SOPN Inject 0.5 mg into the skin once a week. 06/18/22   Donella Stade, PA-C    Family History Family  History  Problem Relation Age of Onset   Diabetes Mother    Stroke Mother    Lupus Mother    Hypertension Mother    Congestive Heart Failure Mother    Mental illness Brother        Not clear what his diagnosis is--may be related to previous drug use.   Cancer Maternal Grandmother        colon   Diabetes Maternal Grandfather    Depression Maternal Uncle    Alcohol abuse Maternal Uncle     Social History Social History   Tobacco Use  Smoking status: Never   Smokeless tobacco: Never  Vaping Use   Vaping Use: Never used  Substance Use Topics   Alcohol use: No    Alcohol/week: 0.0 standard drinks of alcohol   Drug use: No     Allergies   Metaxalone   Review of Systems Review of Systems See HPI  Physical Exam Triage Vital Signs ED Triage Vitals  Enc Vitals Group     BP 10/19/22 1112 121/84     Pulse Rate 10/19/22 1112 (!) 103     Resp 10/19/22 1112 14     Temp 10/19/22 1112 98.4 F (36.9 C)     Temp Source 10/19/22 1112 Oral     SpO2 10/19/22 1112 99 %     Weight 10/19/22 1115 166 lb (75.3 kg)     Height 10/19/22 1115 _0  (1.702 m)     Head Circumference --      Peak Flow --      Pain Score 10/19/22 1114 4     Pain Loc --      Pain Edu? --      Excl. in [place]? --    No data found.  Updated Vital Signs BP 121/84 (BP Location: Left Arm)   Pulse (!) 103   Temp 98.4 F (36.9 C) (Oral)   Resp 14   Ht _1  (1.702 m)   Wt 75.3 kg   SpO2 99%   BMI 26.00 kg/m      Physical Exam Constitutional:      General: She is not in acute distress.    Appearance: She is well-developed.  HENT:     Head: Normocephalic and atraumatic.  Eyes:     Conjunctiva/sclera: Conjunctivae normal.     Pupils: Pupils are equal, round, and reactive to light.  Cardiovascular:     Rate and Rhythm: Normal rate.  Pulmonary:     Effort: Pulmonary effort is normal. No respiratory distress.     Comments: Legs are clear.  There is tenderness underneath the right breast in the  lower rib cage anterior axillary line.  Patient does have a large breasted condition Chest:     Chest wall: Tenderness present.  Abdominal:     General: There is no distension.     Palpations: Abdomen is soft.  Musculoskeletal:        General: Normal range of motion.     Cervical back: Normal range of motion.  Skin:    General: Skin is warm and dry.  Neurological:     Mental Status: She is alert.      UC Treatments / Results  Labs (all labs ordered are listed, but only abnormal results are displayed) Labs Reviewed - No data to display  EKG   Radiology DG Ribs Unilateral W/Chest Right  Result Date: 10/19/2022 CLINICAL DATA:  Patient fell off a pony 5 days ago injuring the right side of the chest. Right lateral chest wall pain. EXAM: RIGHT RIBS AND CHEST - 3+ VIEW COMPARISON:  02/25/2021.  CT, 08/31/2020. FINDINGS: No fracture or other bone lesions are seen involving the ribs. There is no evidence of pneumothorax or pleural effusion. Both lungs are clear. Heart size and mediastinal contours are within normal limits. IMPRESSION: Negative. Electronically Signed   By: Lajean Manes M.D.   On: 10/19/2022 11:46    Procedures Procedures (including critical care time)  Medications Ordered in UC Medications - No data to display  Initial Impression / Assessment and Plan /  UC Course  I have reviewed the triage vital signs and the nursing notes.  Pertinent labs & imaging results that were available during my care of the patient were reviewed by me and considered in my medical decision making (see chart for details).     Final Clinical Impressions(s) / UC Diagnoses   Final diagnoses:  Rib contusion, right, initial encounter  Fall from horse, initial encounter     Discharge Instructions      Take the Voltaren 2 times a day with food Take hydrocodone if needed in addition See your doctor if not improving in a week   ED Prescriptions     Medication Sig Dispense Auth.  Provider   diclofenac (VOLTAREN) 75 MG EC tablet Take 1 tablet (75 mg total) by mouth 2 (two) times daily. 60 tablet Raylene Everts, MD      PDMP not reviewed this encounter.   Raylene Everts, MD 10/19/22 [phone]

## 2022-10-20 ENCOUNTER — Telehealth: Payer: Self-pay

## 2022-10-20 NOTE — Telephone Encounter (Signed)
Called pt to check on status since UC visit. States still pretty sore. Will be picking up Diclofenac today. Advised to take hydrocodone as well if needed per Dr Thera Flake notes. F/u in a week if not improved.

## 2022-10-23 ENCOUNTER — Ambulatory Visit (INDEPENDENT_AMBULATORY_CARE_PROVIDER_SITE_OTHER): Payer: No Payment, Other | Admitting: Clinical

## 2022-10-23 DIAGNOSIS — F319 Bipolar disorder, unspecified: Secondary | ICD-10-CM

## 2022-10-23 NOTE — Progress Notes (Signed)
THERAPIST PROGRESS NOTE Virtual Visit via Video Note  I connected with Frances [name] on 10/23/22 at  2:00 PM EDT by a video enabled telemedicine application and verified that I am speaking with the correct person using two identifiers.  Location: Patient: home Provider: office   I discussed the limitations of evaluation and management by telemedicine and the availability of in person appointments. The patient expressed understanding and agreed to proceed.   Follow Up Instructions: I discussed the assessment and treatment plan with the patient. The patient was provided an opportunity to ask questions and all were answered. The patient agreed with the plan and demonstrated an understanding of the instructions.   The patient was advised to call back or seek an in-person evaluation if the symptoms worsen or if the condition fails to improve as anticipated.   Session Time: 30 minutes  Participation Level: Active  Behavioral Response: CasualAlertEuthymic  Type of Therapy: Individual Therapy  Treatment Goals addressed: Client will score less than a 10 on the PHQ-9 questionnaire  ProgressTowards Goals: Progressing  Interventions: CBT and Supportive  Summary:  Frances [name] is a 45 y.o. female who presents for the scheduled appointment oriented x5, appropriately dressed, and friendly.  Client denied hallucinations and delusions. Client reported on today she is doing pretty well but experiencing some pain.  Client reported she had an incident with her horse got spooked and she had injury to her ribs.  Client reported she was seen by a physician and treated.  Client reported she has been having unexplained anxiety but feels that it is manageable.  Client reported she is being compliant with his psychiatric medications which has been effective for managing her depression and anxiety.  Client reported going into the last few months of the year and during the holidays by history  she experiences depression being by herself that she is learned over time how to manage it.  Client reported throughout the year she keeps in contact with her sister who lives across the country via messages.  Client reported she has not had contact with her brother since their mother passed 3 years ago.  Client reported she has been attending additional mental health support group through [facility] each week.  Client reported she has been continuing to complete self-care activities as discussed in her sessions such as making some healthier meals as well as doing what she is physically able to with organizing her home. Evidence of progress towards goal: Client reported since her last therapy session she has cooked for herself at least 2 times.  Client reported she attends additional support groups at least 1 time per week.  Client's PHQ-9 is a 6.     10/23/2022    2:14 PM 09/25/2022   10:27 AM 08/28/2022   11:47 AM 05/28/2022    9:01 AM  GAD 7 : Generalized Anxiety Score  Nervous, Anxious, on Edge 1 1 0 1  Control/stop worrying 0 0 0 0  Worry too much - different things 0 0 1 1  Trouble relaxing 1 0 0 0  Restless 0 0 0 0  Easily annoyed or irritable 0 0 1 1  Afraid - awful might happen 0 0 0 0  Total GAD 7 Score '2 1 2 3  '$ Anxiety Difficulty Not difficult at all Not difficult at all Not difficult at all Not difficult at all     [facility] from 10/23/2022 in [facility]  PHQ-9 Total  Score 6        Suicidal/Homicidal: Nowithout intent/plan  Therapist Response:  Therapist began the appointment asking the client how she has been doing since last seen. Therapist used CBT to engage using active listening and positive emotional support towards her thoughts and feelings. Therapist used CBT to ask client open-ended questions about current severity of her depressive and anxiety symptoms compared to medication compliant. Therapist used CBT to engage  with the client discussed symptoms of seasonal depression and positive coping skills. Therapist used CBT to ask the client about applied self-care practices as it relates to health that were previously discussed. Therapist used CBT ask the client to identify her progress with frequency of use with coping skills with continued practice in her daily activity.    Therapist assigned client homework to continue attending support groups, doing self-care activities which include cooking nutritious meals for self.   Client was scheduled for next appointment   Plan: Return again in 2 weeks.  Diagnosis: bipolar 1 disorder  Collaboration of Care: Patient refused AEB none requested by the client at this time.  Patient/Guardian was advised Release of Information must be obtained prior to any record release in order to collaborate their care with an outside provider. Patient/Guardian was advised if they have not already done so to contact the registration department to sign all necessary forms in order for [place] to release information regarding their care.   Consent: Patient/Guardian gives verbal consent for treatment and assignment of benefits for services provided during this visit. Patient/Guardian expressed understanding and agreed to proceed.   [place], LCSW 10/23/2022

## 2022-10-23 NOTE — Plan of Care (Signed)
  Problem: Depression CCP Problem  1  Goal: LTG: Frances Celeste "[name]" WILL SCORE LESS THAN 10 ON THE PATIENT HEALTH QUESTIONNAIRE (PHQ-9) Outcome: Progressing Goal: STG: Frances Celeste "[name]" WILL IDENTIFY 3 COGNITIVE PATTERNS AND BELIEFS THAT SUPPORT DEPRESSION Outcome: Progressing

## 2022-10-27 ENCOUNTER — Ambulatory Visit: Payer: Self-pay | Admitting: Sports Medicine

## 2022-10-27 ENCOUNTER — Telehealth: Payer: Self-pay | Admitting: Physician Assistant

## 2022-10-27 NOTE — Telephone Encounter (Signed)
Patient arrived at practice and picked up Ozempic 0.25 / 0.5, 5 boxes on 10/27/2022 at 9:45 AM. Frances [name]

## 2022-11-06 ENCOUNTER — Ambulatory Visit (INDEPENDENT_AMBULATORY_CARE_PROVIDER_SITE_OTHER): Payer: No Payment, Other | Admitting: Clinical

## 2022-11-06 DIAGNOSIS — F319 Bipolar disorder, unspecified: Secondary | ICD-10-CM | POA: Diagnosis not present

## 2022-11-06 NOTE — Progress Notes (Signed)
THERAPIST PROGRESS NOTE Virtual Visit via Video Note  I connected with Para March on 11/06/22 at  1:00 PM EST by a video enabled telemedicine application and verified that I am speaking with the correct person using two identifiers.  Location: Patient: home Provider: office   I discussed the limitations of evaluation and management by telemedicine and the availability of in person appointments. The patient expressed understanding and agreed to proceed.  Follow Up Instructions: I discussed the assessment and treatment plan with the patient. The patient was provided an opportunity to ask questions and all were answered. The patient agreed with the plan and demonstrated an understanding of the instructions.   The patient was advised to call back or seek an in-person evaluation if the symptoms worsen or if the condition fails to improve as anticipated.   Session Time: 30 minutes  Participation Level: Active  Behavioral Response: CasualAlertDepressed  Type of Therapy: Individual Therapy  Treatment Goals addressed: Client will identify 3 cognitive patterns and beliefs that support depression  ProgressTowards Goals: Progressing  Interventions: CBT and Supportive  Summary:  HYDEIA MCATEE is a 46 y.o. female who presents for the scheduled appointment oriented x5, appropriately dressed, and friendly.  Client denied hallucinations and delusions. Client reported she has been maintaining fairly okay.  Client reported in the recent weeks her dryer and stove have broken.  Client reported she had someone helping to fix her dryer but she has had to find another way to cook food.  Client reported otherwise that she has had a concern about her psychiatric medications.  Client reported she has been experiencing some breakthrough symptoms of depression that she has not had in a while such as quick mood shift to feeling down for no explained reason and irritability.  Client reported she  has some negative thoughts but none that involve passive suicidal ideation.  Client reported she is not sure if her body has become adjusted to the medication the way that it is not effective as it once was.  Client reported she described her depressive symptoms as "low to moderate".  Client reported she is getting on average 5 to 6 hours of sleep at night regardless.  Client reported she has to think of some ways to keep herself positively engaged when she is inside and that tending to her outdoor animals.  Client reported she has some positive support from some friends and social involvement in her riding club for horses.  Client reported she is maintaining attending additional mental health support groups at least once per week through the [facility]. Evidence of progress towards goal: Client reported medication compliant 7 days/week. Client reported having some depressive thoughts.   Suicidal/Homicidal: Nowithout intent/plan  Therapist Response:  Therapist began the appointment asking the client how she has been doing since last seen. Therapist used CBT to engage using active listening and positive emotional support. Therapist used CBT to ask client open-ended questions about her concerns with medication compliance compared to breakthrough symptoms she is experiencing. Therapist used CBT to discuss mindfulness to reframe negative thoughts and feelings and engaging in practical positive behavioral activation within means in the home. Therapist assigned the client homework to practice self care.   Plan: Return again in 3 weeks.  Diagnosis: bipolar 1 disorder  Collaboration of Care: Patient refused AEB none requested by the client.  Patient/Guardian was advised Release of Information must be obtained prior to any record release in order to collaborate their care with an  outside provider. Patient/Guardian was advised if they have not already done so to contact the registration department  to sign all necessary forms in order for [place] to release information regarding their care.   Consent: Patient/Guardian gives verbal consent for treatment and assignment of benefits for services provided during this visit. Patient/Guardian expressed understanding and agreed to proceed.   [place], LCSW 11/06/2022

## 2022-11-06 NOTE — Plan of Care (Signed)
  Problem: Depression CCP Problem  1  Goal: LTG: Frances Celeste "[name]" WILL SCORE LESS THAN 10 ON THE PATIENT HEALTH QUESTIONNAIRE (PHQ-9) Outcome: Progressing Goal: STG: Frances Celeste "[name]" WILL IDENTIFY 3 COGNITIVE PATTERNS AND BELIEFS THAT SUPPORT DEPRESSION Outcome: Progressing

## 2022-11-08 ENCOUNTER — Encounter (HOSPITAL_BASED_OUTPATIENT_CLINIC_OR_DEPARTMENT_OTHER): Payer: Self-pay | Admitting: Emergency Medicine

## 2022-11-08 ENCOUNTER — Emergency Department (HOSPITAL_BASED_OUTPATIENT_CLINIC_OR_DEPARTMENT_OTHER)
Admission: EM | Admit: 2022-11-08 | Discharge: 2022-11-08 | Disposition: A | Discharge status: Home / Self Care | Payer: Self-pay | Attending: Emergency Medicine | Admitting: Emergency Medicine

## 2022-11-08 ENCOUNTER — Ambulatory Visit (HOSPITAL_COMMUNITY): Admission: EM | Admit: 2022-11-08 | Discharge: 2022-11-08 | Discharge status: Against Medical Advice | Payer: Self-pay

## 2022-11-08 DIAGNOSIS — S4992XA Unspecified injury of left shoulder and upper arm, initial encounter: Secondary | ICD-10-CM | POA: Insufficient documentation

## 2022-11-08 DIAGNOSIS — Z79899 Other long term (current) drug therapy: Secondary | ICD-10-CM | POA: Insufficient documentation

## 2022-11-08 NOTE — ED Triage Notes (Signed)
Pt reports fell off pony today onto gravel; concerned because LUE keeps bleeding (puncture wounds)

## 2022-11-08 NOTE — Discharge Instructions (Signed)
You came in today with a cut to your left arm that was bleeding.  It is not bleeding on my physical exam and there are no major arteries to be concerned about in this area.  If it begins to bleed use gauze or towel to hold pressure.  You may use Neosporin or bacitracin over the wound twice a day.  It was a pleasure to meet you and we hope you feel better!

## 2022-11-08 NOTE — ED Provider Notes (Signed)
[name] EMERGENCY DEPARTMENT Provider Note   CSN: [id number] Arrival date & time: 11/08/22  1742     History  Chief Complaint  Patient presents with   Arm Injury    Frances [name] is a 46 y.o. female presenting today with a left arm injury.  Reports that this morning she fell off of her horse and had bleeding to her left arm.  She says it keeps bleeding through Band-Aids so she wanted to come and get it checked.  Not on any blood thinners.  No joint pain, full range of motion of the elbow joint   Arm Injury      Home Medications Prior to Admission medications   Medication Sig Start Date End Date Taking? Authorizing Provider  albuterol (VENTOLIN HFA) 108 (90 Base) MCG/ACT inhaler Take 2 puffs 15 minutes before exercise and as needed for shortness of breath. Patient not taking: Reported on 10/19/2022 08/27/21   Donella Stade, PA-C  ARIPiprazole (ABILIFY) 15 MG tablet Take 1 tablet (15 mg total) by mouth daily. 08/28/22   Salley Slaughter, NP  atomoxetine (STRATTERA) 80 MG capsule Take 1 capsule (80 mg total) by mouth daily. 09/22/22   Salley Slaughter, NP  atorvastatin (LIPITOR) 20 MG tablet Take 1 tablet (20 mg total) by mouth daily. 07/08/22   Breeback, Jade L, PA-C  Azelastine HCl 137 MCG/SPRAY SOLN USE 2 SPRAYS INTO ALERNATE NOSTRILS TWICE DAILY 07/17/22   Breeback, Jade L, PA-C  baclofen (LIORESAL) 10 MG tablet Take by mouth. 10/09/22   [provider]  benztropine (COGENTIN) 0.5 MG tablet Take 1 tablet (0.5 mg total) by mouth daily. 08/28/22   Salley Slaughter, NP  blood glucose meter kit and supplies KIT Dispense based on patient and insurance preference. Use up to four times daily as directed. 05/29/21   Breeback, Jade L, PA-C  buPROPion (WELLBUTRIN XL) 150 MG 24 hr tablet Take 1 tablet (150 mg total) by mouth every morning. 08/28/22   Salley Slaughter, NP  buPROPion (WELLBUTRIN XL) 300 MG 24 hr tablet Take 1 tablet (300 mg total) by mouth  daily. 08/28/22   Salley Slaughter, NP  cholecalciferol (VITAMIN D3) 25 MCG (1000 UT) tablet Take 1,000 Units by mouth daily.    [provider]  diclofenac (VOLTAREN) 75 MG EC tablet Take 1 tablet (75 mg total) by mouth 2 (two) times daily. 10/19/22   Raylene Everts, MD  DULoxetine HCl 30 MG CSDR Take 90 mg by mouth daily.    [provider]  fluticasone (FLONASE) 50 MCG/ACT nasal spray USE 2 SPRAYS IN EACH NOSTRIL EVERY DAY 09/23/22   Breeback, Jade L, PA-C  HYDROcodone-acetaminophen (NORCO/VICODIN) 5-325 MG tablet Take 1 tablet by mouth 2 (two) times daily as needed for moderate pain (back pain). 09/23/22   Breeback, Royetta Car, PA-C  hydroxychloroquine (PLAQUENIL) 200 MG tablet Take 200 mg by mouth 2 (two) times daily. 11/21/21 11/21/22  [provider]  levocetirizine (XYZAL) 5 MG tablet Take 1 tablet (5 mg total) by mouth every evening. 06/18/22   Breeback, Jade L, PA-C  metFORMIN (GLUCOPHAGE) 1000 MG tablet TAKE 1 Tablet  BY MOUTH TWICE DAILY WITH A MEAL 09/23/22   Breeback, Jade L, PA-C  montelukast (SINGULAIR) 10 MG tablet TAKE 1 Tablet BY MOUTH ONCE EVERY NIGHT AT BEDTIME 09/23/22   Breeback, Jade L, PA-C  pregabalin (LYRICA) 75 MG capsule TAKE 1 CAPSULE BY MOUTH TWICE A DAY 08/22/22   Silverio Decamp,  MD  Semaglutide,0.25 or 0.5MG/DOS, (OZEMPIC, 0.25 OR 0.5 MG/DOSE,) 2 MG/1.5ML SOPN Inject 0.5 mg into the skin once a week. 06/18/22   Donella Stade, PA-C      Allergies    Metaxalone    Review of Systems   Review of Systems  Physical Exam Updated Vital Signs BP (!) 127/90 (BP Location: Right Arm)   Pulse 100   Temp 98.6 F (37 C) (Oral)   Resp 18   Ht _0  (1.702 m)   Wt 74.8 kg   LMP 09/22/2022   SpO2 100%   BMI 25.84 kg/m  Physical Exam Vitals and nursing note reviewed.  Constitutional:      Appearance: Normal appearance.  HENT:     Head: Normocephalic and atraumatic.  Eyes:     General: No scleral icterus.    Conjunctiva/sclera:  Conjunctivae normal.  Pulmonary:     Effort: Pulmonary effort is normal. No respiratory distress.  Musculoskeletal:     Comments: Full range of motion of the elbow, strong radial pulse, sensation intact  Skin:    Findings: Bruising present. No rash.     Comments: Superficial abrasions to the left volar surface of the forearm just distal to the elbow bleeding is controlled  Neurological:     Mental Status: She is alert.  Psychiatric:        Mood and Affect: Mood normal.     ED Results / Procedures / Treatments   Labs (all labs ordered are listed, but only abnormal results are displayed) Labs Reviewed - No data to display  EKG None  Radiology No results found.  Procedures Procedures   Medications Ordered in ED Medications - No data to display  ED Course/ Medical Decision Making/ A&P                           Medical Decision Making  46 year old female presenting with a bleeding left upper extremity.  Bleeding is controlled.  No wounds requiring repair.  She is not on any blood thinners and there are no deep wounds or injuries near any major arteries or veins.  Believe bleeding will stop with pressure if it begins to bleed again at home.  No intervention necessary.  Wound was cleaned and patient was discharged   Final Clinical Impression(s) / ED Diagnoses Final diagnoses:  Injury of left upper extremity, initial encounter    Rx / DC Orders ED Discharge Orders     None      Results and diagnoses were explained to the patient. Return precautions discussed in full. Patient had no additional questions and expressed complete understanding.   This chart was dictated using voice recognition software.  Despite best efforts to proofread,  errors can occur which can change the documentation meaning.    Rhae Hammock, PA-C 11/08/22 1803    Lennice Sites, DO 11/08/22 1806

## 2022-11-10 ENCOUNTER — Telehealth: Payer: Self-pay | Admitting: General Practice

## 2022-11-10 NOTE — Telephone Encounter (Signed)
Transition Care Management Follow-up Telephone Call Date of discharge and from where: 11/08/22 from [facility] How have you been since you were released from the hospital? Patient stated that she is better.  Any questions or concerns? No  Items Reviewed: Did the pt receive and understand the discharge instructions provided? Yes  Medications obtained and verified? Yes  Other? No  Any new allergies since your discharge? No  Dietary orders reviewed? Yes Do you have support at home? Yes   Home Care and Equipment/Supplies: Were home health services ordered? no   Functional Questionnaire: (I = Independent and D = Dependent) ADLs: I  Bathing/Dressing- I  Meal Prep- I  Eating- I  Maintaining continence- I  Transferring/Ambulation- I  Managing Meds- I  Follow up appointments reviewed:  PCP Hospital f/u appt confirmed? No   Specialist Hospital f/u appt confirmed? No   Are transportation arrangements needed? No  If their condition worsens, is the pt aware to call PCP or go to the Emergency Dept.? Yes Was the patient provided with contact information for the PCP's office or ED? Yes Was to pt encouraged to call back with questions or concerns? Yes

## 2022-11-17 ENCOUNTER — Encounter: Payer: Self-pay | Admitting: Physician Assistant

## 2022-11-17 ENCOUNTER — Ambulatory Visit (INDEPENDENT_AMBULATORY_CARE_PROVIDER_SITE_OTHER): Payer: Self-pay | Admitting: Medical-Surgical

## 2022-11-17 VITALS — BP 148/95 | HR 103 | Ht 67.0 in | Wt 166.0 lb

## 2022-11-17 DIAGNOSIS — L03114 Cellulitis of left upper limb: Secondary | ICD-10-CM

## 2022-11-17 MED ORDER — CEPHALEXIN 500 MG PO CAPS: 500.0000 mg | ORAL_CAPSULE | Freq: Three times a day (TID) | ORAL | 0 refills | Status: DC

## 2022-11-17 MED ORDER — BACLOFEN 10 MG PO TABS: 10.0000 mg | ORAL_TABLET | Freq: Two times a day (BID) | ORAL | 0 refills | Status: DC | PRN

## 2022-11-17 NOTE — Progress Notes (Signed)
Established Patient Office Visit  Subjective   Patient ID: SITLALY GUDIEL, female   DOB: Oct 23, 1976 Age: 46 y.o. MRN: [id number]   Chief Complaint  Patient presents with   Open Wound    HPI Pleasant 46 year old female presenting today for evaluation of an injury she sustained about 9 days ago. She was trying to get on her pony but her pants got caught on the saddle. When her leg touched the pony's rump, he shied away and she fel to the ground, landing on her left forearm on gravel. She did go to the ED for evaluation but there was no fracture and she was sent home with wound care instructions. Over the past week, there has been worsening of the bruising with some tenderness on palpation. Has several small open spots that are draining thin, yellow fluid. Is keeping it wrapped up during the day and has been putting triple antibiotic ointment on it since it happened. Leaves the dressing off for a couple of hours each night. Has been taking Norco and Baclofen that was ordered for pain for other conditions. Is requesting a refill on Baclofen. Denies fevers, chills, and malodorous drainage.    Objective:    Vitals:   11/17/22 1440  BP: (!) 148/95  Pulse: (!) 103  Height: '5\' 7"'$  (1.702 m)  Weight: 166 lb (75.3 kg)  SpO2: 100%  BMI (Calculated): 25.99    Physical Exam Vitals and nursing note reviewed.  Constitutional:      General: She is not in acute distress.    Appearance: Normal appearance. She is not ill-appearing.  HENT:     Head: Normocephalic and atraumatic.  Cardiovascular:     Rate and Rhythm: Normal rate and regular rhythm.     Pulses: Normal pulses.     Heart sounds: Normal heart sounds.  Pulmonary:     Effort: Pulmonary effort is normal. No respiratory distress.     Breath sounds: Normal breath sounds. No wheezing, rhonchi or rales.  Musculoskeletal:        General: Signs of injury (left forearm) present.  Skin:    General: Skin is warm and dry.     Findings:  Bruising (left forearm) and erythema (left forearm) present.  Neurological:     Mental Status: She is alert and oriented to person, place, and time.  Psychiatric:        Mood and Affect: Mood normal.        Behavior: Behavior normal.        Thought Content: Thought content normal.        Judgment: Judgment normal.   No results found for this or any previous visit (from the past 24 hour(s)).     The ASCVD Risk score (Arnett DK, et al., 2019) failed to calculate for the following reasons:   The valid total cholesterol range is 130 to 320 mg/dL   Assessment & Plan:   1. Cellulitis of left upper extremity Left forearm wound does appear to be healing fairly well but the redness in the center near the puncture wounds is concerning for cellulitis. Treating with Kefflex '500mg'$  TID x 7 days. Reviewed site care with washing the area with warm soapy water, rinsing well, then patting dry. If out and about, keep the area covered but ok to leave open if home and relaxing in the evening. Avoid overuse of antibiotic ointment to prevent worsening of redness and irritation. Baclofen refilled.  - cephALEXin (KEFLEX) 500 MG capsule; Take 1 capsule (  500 mg total) by mouth 3 (three) times daily.  Dispense: 21 capsule; Refill: 0  Return if symptoms worsen or fail to improve.  ___________________________________________ Clearnce Sorrel, DNP, APRN, FNP-BC [facility] [place]

## 2022-11-19 MED FILL — HYDROXYCHLOROQUINE 200 MG TABLET: ORAL | 30 days supply | Qty: 60 | Fill #4

## 2022-11-19 MED FILL — DICLOFENAC SODIUM 75 MG TABLET,DELAYED RELEASE: ORAL | 30 days supply | Qty: 60 | Fill #1

## 2022-11-19 MED FILL — DULOXETINE 30 MG CAPSULE,DELAYED RELEASE: ORAL | 30 days supply | Qty: 90 | Fill #2

## 2022-11-24 ENCOUNTER — Telehealth (HOSPITAL_COMMUNITY): Payer: No Payment, Other | Admitting: Psychiatry

## 2022-11-25 ENCOUNTER — Encounter (HOSPITAL_COMMUNITY): Payer: Self-pay | Admitting: Psychiatry

## 2022-11-25 ENCOUNTER — Telehealth (INDEPENDENT_AMBULATORY_CARE_PROVIDER_SITE_OTHER): Payer: No Payment, Other | Admitting: Psychiatry

## 2022-11-25 DIAGNOSIS — F319 Bipolar disorder, unspecified: Secondary | ICD-10-CM | POA: Diagnosis not present

## 2022-11-25 DIAGNOSIS — F900 Attention-deficit hyperactivity disorder, predominantly inattentive type: Secondary | ICD-10-CM

## 2022-11-25 MED ORDER — BUPROPION HCL ER (XL) 300 MG PO TB24: 300.0000 mg | ORAL_TABLET | Freq: Every day | ORAL | 3 refills | Status: DC

## 2022-11-25 MED ORDER — BENZTROPINE MESYLATE 0.5 MG PO TABS: 0.5000 mg | ORAL_TABLET | Freq: Every day | ORAL | 3 refills | Status: DC

## 2022-11-25 MED ORDER — ARIPIPRAZOLE 15 MG PO TABS: 15.0000 mg | ORAL_TABLET | Freq: Every day | ORAL | 3 refills | Status: DC

## 2022-11-25 NOTE — Progress Notes (Signed)
BH MD/PA/NP OP Progress Note  Virtual Visit via Video Note  I connected with Para March on 11/25/22 at 11:30 AM EST by a video enabled telemedicine application and verified that I am speaking with the correct person using two identifiers.  Location: Patient: Home Provider: Clinic   I discussed the limitations of evaluation and management by telemedicine and the availability of in person appointments. The patient expressed understanding and agreed to proceed.  I provided 30 minutes of non-face-to-face time during this encounter.           11/25/2022 12:02 PM DALONDA SIMONI  MRN:  [id number]  Chief Complaint: "I have been depressed"  HPI: 46 year old female seen today for follow up psychiatric evaluation. She has a psychiatric history of bipolar 1, borderline personality disorder, depression, and anxiety. She is currently managed on Abilify 15 mg nightly, Cogentin 0.5 mg daily, Wellbutrin 450 mg daily, and Cymbalta 90 mg daily (from rheumatologist). Today, patient notes that medications are somewhat effective in managing her symptoms.   Today the patient was well-groomed, pleasant, cooperative, engaged in eye contact, and engaged in conversation. She informed provider that she has been depressed.  She notes that her medications are not effective.  Patient notes that the 3-year anniversary of her mother's death will be coming up.  At times she notes that this is overwhelming and reports that she has been thinking about it more frequently.  Despite the stressors patient notes that her anxiety is well managed.  Provider conducted a GAD-7 and patient scored a 4. ,  at her last visit she scored 2.  Provider also conducted PHQ-9 ans she scored an 11, at her last visit she scored a 4.  She endorses hypersomnia noting that she sleeps over 10 hours.  Today she denies SI/HI/VAH, mania, paranoia.    Patient recently fell off her porch and injured her left arm.  She notes that her pain  is well managed and she is not having to take medication for this.  Patient notes that she continues to worry about her disability being denied.  She notes that she has appealed this and has a Chief Executive Officer.  Patient informed to request her records from [facility] records if needed.  She endorsed understanding and agreed.  Patient notes that Wellbutrin and Strattera were ineffective in managing her ADHD. Patient would like to discontinue Strattera.   At this time no other medications started to help manage ADHD.  Patient was agreeable to a dose reduction of Wellbutrin and now will take Wellbutrin 300 mg daily.  She will continue all other medications as prescribed and follow-up with outpatient counseling for therapy.  No other concerns at this time.         Visit Diagnosis:    ICD-10-CM   1. Bipolar I disorder (HCC)  F31.9 ARIPiprazole (ABILIFY) 15 MG tablet    benztropine (COGENTIN) 0.5 MG tablet    buPROPion (WELLBUTRIN XL) 300 MG 24 hr tablet    2. Attention deficit hyperactivity disorder (ADHD), predominantly inattentive type  F90.0 buPROPion (WELLBUTRIN XL) 300 MG 24 hr tablet      Past Psychiatric History: Bipolar, anxiety, and depression Past Medical History:  Past Medical History:  Diagnosis Date   Anxiety    Arthritis    Back pain, chronic    Bipolar 1 disorder (HCC)    Chronic fatigue syndrome    DDD (degenerative disc disease), lumbar    Depression    Diabetes mellitus without  complication ([place])    Fibromyalgia    IUD 2012   Mirena   MTHFR gene mutation     Past Surgical History:  Procedure Laterality Date   COLONOSCOPY WITH PROPOFOL N/A 02/25/2017   Procedure: COLONOSCOPY WITH PROPOFOL;  Surgeon: Arta Silence, MD;  Location: [facility];  Service: Endoscopy;  Laterality: N/A;   left knee lateral release  1993   scar tisue removal  03/2005   left ankle   TONSILLECTOMY  age 28's    Family Psychiatric History: Maternal aunts Bipolar disorder and uncle  alcoholic  Family History:  Family History  Problem Relation Age of Onset   Diabetes Mother    Stroke Mother    Lupus Mother    Hypertension Mother    Congestive Heart Failure Mother    Mental illness Brother        Not clear what his diagnosis is--may be related to previous drug use.   Cancer Maternal Grandmother        colon   Diabetes Maternal Grandfather    Depression Maternal Uncle    Alcohol abuse Maternal Uncle     Social History:  Social History   Socioeconomic History   Marital status: Single    Spouse name: Not on file   Number of children: 0   Years of education: Not on file   Highest education level: Bachelor's degree (e.g., BA, AB, BS)  Occupational History   Occupation: Designer, industrial/product at times  Tobacco Use   Smoking status: Never   Smokeless tobacco: Never  Vaping Use   Vaping Use: Never used  Substance and Sexual Activity   Alcohol use: No    Alcohol/week: 0.0 standard drinks of alcohol   Drug use: No   Sexual activity: Not on file  Other Topics Concern   Not on file  Social History Narrative   Originally from [place], outside of [place].    Moved here permanently 2012.   Intermittently worked on a farm.   Lives with many cats--3 plus fosters cats.    Single, lives alone in a one story home. Rarely drinks caffeine. 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   Social Determinants of Health   Financial Resource Strain: Medium Risk (12/06/2020)   Overall Financial Resource Strain (CARDIA)    Difficulty of Paying Living Expenses: Somewhat hard  Food Insecurity: No Food Insecurity (12/06/2020)   Hunger Vital Sign    Worried About Running Out of Food in the Last Year: Never true    Ran Out of Food in the Last Year: Never true  Transportation Needs: No Transportation Needs (12/06/2020)   PRAPARE - Hydrologist (Medical): No    Lack of Transportation (Non-Medical): No  Physical Activity: Inactive  (12/06/2020)   Exercise Vital Sign    Days of Exercise per Week: 0 days    Minutes of Exercise per Session: 0 min  Stress: No Stress Concern Present (12/06/2020)   [place]    Feeling of Stress : Not at all  Social Connections: Moderately Isolated (12/06/2020)   Social Connection and Isolation Panel [NHANES]    Frequency of Communication with Friends and Family: More than three times a week    Frequency of Social Gatherings with Friends and Family: More than three times a week    Attends Religious Services: Never    Marine scientist or Organizations: Yes    Attends [employer]  or Organization Meetings: More than 4 times per year    Marital Status: Never married    Allergies:  Allergies  Allergen Reactions   Metaxalone Hives    Metabolic Disorder Labs: Lab Results  Component Value Date   HGBA1C 5.2 09/09/2022   MPG 103 04/14/2018   No results found for: "PROLACTIN" Lab Results  Component Value Date   CHOL 100 09/10/2022   TRIG 103 09/10/2022   HDL 40 09/10/2022   CHOLHDL 2.5 09/10/2022   LDLCALC 41 09/10/2022   LDLCALC 89 01/20/2022   Lab Results  Component Value Date   TSH 0.866 11/21/2020   TSH 1.616 01/27/2018    Therapeutic Level Labs: No results found for: "LITHIUM" No results found for: "VALPROATE" No results found for: "CBMZ"  Current Medications: Current Outpatient Medications  Medication Sig Dispense Refill   albuterol (VENTOLIN HFA) 108 (90 Base) MCG/ACT inhaler Take 2 puffs 15 minutes before exercise and as needed for shortness of breath. 6.7 g 1   ARIPiprazole (ABILIFY) 15 MG tablet Take 1 tablet (15 mg total) by mouth daily. 30 tablet 3   atorvastatin (LIPITOR) 20 MG tablet Take 1 tablet (20 mg total) by mouth daily. 90 tablet 3   Azelastine HCl 137 MCG/SPRAY SOLN USE 2 SPRAYS INTO ALERNATE NOSTRILS TWICE DAILY 30 mL 2   baclofen (LIORESAL) 10 MG tablet Take 1 tablet (10 mg total)  by mouth 2 (two) times daily as needed for muscle spasms. 60 each 0   benztropine (COGENTIN) 0.5 MG tablet Take 1 tablet (0.5 mg total) by mouth daily. 30 tablet 3   blood glucose meter kit and supplies KIT Dispense based on patient and insurance preference. Use up to four times daily as directed. 1 each 0   buPROPion (WELLBUTRIN XL) 150 MG 24 hr tablet Take 1 tablet (150 mg total) by mouth every morning. 30 tablet 3   buPROPion (WELLBUTRIN XL) 300 MG 24 hr tablet Take 1 tablet (300 mg total) by mouth daily. 30 tablet 3   cephALEXin (KEFLEX) 500 MG capsule Take 1 capsule (500 mg total) by mouth 3 (three) times daily. 21 capsule 0   cholecalciferol (VITAMIN D3) 25 MCG (1000 UT) tablet Take 1,000 Units by mouth daily.     diclofenac (VOLTAREN) 75 MG EC tablet Take 1 tablet (75 mg total) by mouth 2 (two) times daily. 60 tablet 0   DULoxetine HCl 30 MG CSDR Take 90 mg by mouth daily.     fluticasone (FLONASE) 50 MCG/ACT nasal spray USE 2 SPRAYS IN EACH NOSTRIL EVERY DAY 16 g 1   HYDROcodone-acetaminophen (NORCO/VICODIN) 5-325 MG tablet Take 1 tablet by mouth 2 (two) times daily as needed for moderate pain (back pain). 60 tablet 0   levocetirizine (XYZAL) 5 MG tablet Take 1 tablet (5 mg total) by mouth every evening. 90 tablet 1   metFORMIN (GLUCOPHAGE) 1000 MG tablet TAKE 1 Tablet  BY MOUTH TWICE DAILY WITH A MEAL 180 tablet 1   montelukast (SINGULAIR) 10 MG tablet TAKE 1 Tablet BY MOUTH ONCE EVERY NIGHT AT BEDTIME 90 tablet 1   pregabalin (LYRICA) 75 MG capsule TAKE 1 CAPSULE BY MOUTH TWICE A DAY 180 capsule 1   Semaglutide,0.25 or 0.5MG/DOS, (OZEMPIC, 0.25 OR 0.5 MG/DOSE,) 2 MG/1.5ML SOPN Inject 0.5 mg into the skin once a week. 4.5 mL 0   No current facility-administered medications for this visit.     Musculoskeletal: Strength & Muscle Tone: within normal limits and  telehealth visit Gait &  Station: normal, telehealth visit Patient leans: N/A  Psychiatric Specialty Exam: Review of Systems   Last menstrual period 09/22/2022.There is no height or weight on file to calculate BMI.  General Appearance: Well Groomed  Eye Contact:  Good  Speech:  Clear and Coherent and Normal Rate  Volume:  Normal  Mood:   Mild depression  Affect:  Congruent  Thought Process:  Coherent, Goal Directed and Linear  Orientation:  Full (Time, Place, and Person)  Thought Content: WDL and Logical   Suicidal Thoughts:  No  Homicidal Thoughts:  No  Memory:  Immediate;   Good Recent;   Good Remote;   Good  Judgement:  Good  Insight:  Good  Psychomotor Activity:  Normal  Concentration:  Concentration: Fair and Attention Span: Fair  Recall:  Good  Fund of Knowledge: Good  Language: Good  Akathisia:  No  Handed:  Right  AIMS (if indicated): Not done  Assets:  Communication Skills Desire for Improvement Financial Resources/Insurance Housing Social Support  ADL's:  Intact  Cognition: WNL  Sleep:  Good   Screenings: AIMS    Flowsheet Row Admission (Discharged) from 01/25/2018 in [facility]  AIMS Total Score 0      AUDIT    Flowsheet Row Admission (Discharged) from 01/25/2018 in [facility]  Alcohol Use Disorder Identification Test Final Score (AUDIT) 0      GAD-7    Flowsheet Row Video Visit from 11/25/2022 in [facility] Counselor from 10/23/2022 in [facility] Office Visit from 09/25/2022 in [facility] Video Visit from 08/28/2022 in [facility] Video Visit from 05/28/2022 in [facility]  Total GAD-7 Score _0 [id number]    Flowsheet Row Video Visit from 11/25/2022 in [facility] Counselor from 10/23/2022 in [facility] Office Visit from 09/25/2022 in [facility] Visit  from 09/09/2022 in [facility] Video Visit from 08/28/2022 in [facility]  PHQ-2 Total Score _1 0  PHQ-9 Total Score _2 -- 4      Flowsheet Row ED from 11/08/2022 in [facility] ED from 10/19/2022 in [facility] ED from 07/20/2022 in [facility] No Risk No Risk Error: Question 6 not populated        Assessment and Plan: Patient endorses mild depression due to recent life stressors and the anniversary of her mother's death. Patient notes that Wellbutrin and Strattera were ineffective in managing her ADHD.  Patient would like to discontinue Strattera.  At this time no other medications started to help manage ADHD.  Patient was agreeable to a dose reduction of Wellbutrin and now will take Wellbutrin 300 mg daily.  She will continue all other medications as prescribed and follow-up with outpatient counseling for therapy.   1. Bipolar I disorder (HCC)  Continue- ARIPiprazole (ABILIFY) 15 MG tablet; Take 1 tablet (15 mg total) by mouth daily.  Dispense: 30 tablet; Refill: 3 Continue- benztropine (COGENTIN) 0.5 MG tablet; Take 1 tablet (0.5 mg total) by mouth daily.  Dispense: 30 tablet; Refill: 3 Reduced- buPROPion (WELLBUTRIN XL) 300 MG 24 hr tablet; Take 1 tablet (300 mg total)  by mouth daily.  Dispense: 30 tablet; Refill: 3  2. Attention deficit hyperactivity disorder (ADHD), predominantly inattentive type  Reduced- buPROPion (WELLBUTRIN XL) 300 MG 24 hr tablet; Take 1 tablet (300 mg total) by mouth daily.  Dispense: 30 tablet; Refill: 3   Follow up in 3 moths Follow up with therapy   Salley Slaughter, NP 11/25/2022, 12:03 PM

## 2022-11-27 ENCOUNTER — Ambulatory Visit (INDEPENDENT_AMBULATORY_CARE_PROVIDER_SITE_OTHER): Payer: Self-pay | Admitting: Family Medicine

## 2022-11-27 ENCOUNTER — Encounter: Payer: Self-pay | Admitting: Family Medicine

## 2022-11-27 ENCOUNTER — Ambulatory Visit (INDEPENDENT_AMBULATORY_CARE_PROVIDER_SITE_OTHER): Payer: No Payment, Other | Admitting: Clinical

## 2022-11-27 VITALS — BP 117/81 | HR 88 | Ht 67.0 in | Wt 161.0 lb

## 2022-11-27 DIAGNOSIS — M545 Low back pain, unspecified: Secondary | ICD-10-CM

## 2022-11-27 DIAGNOSIS — M25551 Pain in right hip: Secondary | ICD-10-CM

## 2022-11-27 DIAGNOSIS — F319 Bipolar disorder, unspecified: Secondary | ICD-10-CM

## 2022-11-27 DIAGNOSIS — M0609 Rheumatoid arthritis without rheumatoid factor, multiple sites: Secondary | ICD-10-CM

## 2022-11-27 DIAGNOSIS — G8929 Other chronic pain: Secondary | ICD-10-CM

## 2022-11-27 DIAGNOSIS — M5136 Other intervertebral disc degeneration, lumbar region: Secondary | ICD-10-CM

## 2022-11-27 DIAGNOSIS — S59912D Unspecified injury of left forearm, subsequent encounter: Secondary | ICD-10-CM

## 2022-11-27 MED ORDER — HYDROCODONE-ACETAMINOPHEN 5-325 MG PO TABS: 1.0000 | ORAL_TABLET | Freq: Two times a day (BID) | ORAL | 0 refills | Status: DC | PRN

## 2022-11-27 NOTE — Progress Notes (Signed)
   Acute Office Visit  Subjective:     Patient ID: Frances [name], female    DOB: [birth year], 46 y.o.   MRN: [id number]  I interviewed and examined the patient and agree with assessment and plan.

## 2022-11-27 NOTE — Progress Notes (Signed)
   Acute Office Visit  Subjective:     Patient ID: Frances [name], female    DOB: 1976-03-24, 46 y.o.   MRN: [id number]  Chief Complaint  Patient presents with   Arm Pain    Arm Pain    Patient is in today for follow up on left arm cellulitis after falling of a pony landing on gravel about 3 weeks ago. She was last seen 11/27 and prescribed Keflex. At the time, the wound was draining with yellow fluid. The drainage has stopped. She just experiences some pain with touch and cannot lean her arm against anything. She completed the antibiotic. She just wanted to come in and follow up to make sure it was healing properly. She has been covering it with a bandage. Denies any pus, burning, itching.   Review of Systems  Skin:  Positive for rash. Negative for itching.        Objective:    BP 117/81   Pulse 88   Ht '5\' 7"'$  (1.702 m)   Wt 73 kg   LMP 09/22/2022   SpO2 99%   BMI 25.22 kg/m    Physical Exam Skin:    General: Skin is warm.     Findings: Abrasion, bruising, erythema, rash and wound present.     Comments: No drainage. Abrasion healing. Dry skin present. No induration.      No results found for any visits on 11/27/22.      Assessment & Plan:   Problem List Items Addressed This Visit       Musculoskeletal and Integument   Rheumatoid arthritis 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 (HCC)   Relevant Medications   HYDROcodone-acetaminophen (NORCO/VICODIN) 5-325 MG tablet     Other   Back pain   Relevant Medications   HYDROcodone-acetaminophen (NORCO/VICODIN) 5-325 MG tablet   Chronic right hip pain   Relevant Medications   HYDROcodone-acetaminophen (NORCO/VICODIN) 5-325 MG tablet   Other Visit Diagnoses     Injury of left lower arm, subsequent encounter    -  Primary   DDD (degenerative disc disease), lumbar       Relevant Medications   HYDROcodone-acetaminophen (NORCO/VICODIN) 5-325 MG tablet       Meds ordered this encounter   Medications   HYDROcodone-acetaminophen (NORCO/VICODIN) 5-325 MG tablet    Sig: Take 1 tablet by mouth 2 (two) times daily as needed for moderate pain (back pain).    Dispense:  60 tablet    Refill:  0   Refilled Hydrocodone for pain relief and rheumatoid arthritis.  Arm is healing well. No signs of ongoing infection.  Can apply Vaseline as needed for dry skin. Follow up as needed.   No follow-ups on file.  Dorian Heckle, Student-PA

## 2022-11-27 NOTE — Progress Notes (Signed)
THERAPIST PROGRESS NOTE Virtual Visit via Video Note  I connected with Para March on 11/27/22 at  1:00 PM EST by a video enabled telemedicine application and verified that I am speaking with the correct person using two identifiers.  Location: Patient: home Provider: office   I discussed the limitations of evaluation and management by telemedicine and the availability of in person appointments. The patient expressed understanding and agreed to proceed.   Follow Up Instructions: I discussed the assessment and treatment plan with the patient. The patient was provided an opportunity to ask questions and all were answered. The patient agreed with the plan and demonstrated an understanding of the instructions.   The patient was advised to call back or seek an in-person evaluation if the symptoms worsen or if the condition fails to improve as anticipated.   Session Time: 30 minutes  Participation Level: Active  Behavioral Response: CasualAlertEuthymic  Type of Therapy: Individual Therapy  Treatment Goals addressed: client will identify 3 cognitive patterns and beliefs that support depression  ProgressTowards Goals: Progressing  Interventions: CBT  Summary:  Frances [name] is a 46 y.o. female who presents for the scheduled appointment oriented times five, appropriately dressed, and friendly. Client denied hallucinations and delusions. Client reported on today she is doing fairly well. Client reported she had her medications slightly adjusted with the psychiatrist a few days ago. Client reported she does well with her morning medications but sometimes forgets her night time medication. Client reported the doctor told her to contact her if another adjustment is needed due to her seasonal depression. Client reported otherwise yesterday was the 3 year anniversary of her mothers passing. Client reported she is not sure but for some reason it seems to be bothering her a little  more than usual this year. Client reported having some guilt thinking back to not talking to her more. Client reported overall she is managing the emotions and thoughts of guilt fairly well on her own. Client reported she has been feeling more productive around the house doing more than usual. Client reported she also has been doing paper applications to reapply for financial assistance for her medications and other needs. Client reported today she rates her depression a 3 or 4 out of 10, with 10 being the worst. Evidence of progress towards goal:  client reported 1 positive of finding activities to do at home to decrease depression. Client reported 1 source of her depression is grief.   Suicidal/Homicidal: Nowithout intent/plan  Therapist Response:  Therapist began the appointment asking the client how she has been doing since last seen. Therapist used CBT to engage using active listening and positive emotional support. Therapist used CBT to discuss seasonal depression. Therapist used CBT to discuss her symptoms of grief and normalize her reactions. Therapist used CBT ask the client to identify her progress with frequency of use with coping skills with continued practice in her daily activity.    Therapist assigned the client homework to practice self care.   Plan: Return again in 3 weeks.  Diagnosis: bipolar 1 disorder  Collaboration of Care: Patient refused AEB none requested by the client.  Patient/Guardian was advised Release of Information must be obtained prior to any record release in order to collaborate their care with an outside provider. Patient/Guardian was advised if they have not already done so to contact the registration department to sign all necessary forms in order for [place] to release information regarding their care.   Consent: Patient/Guardian gives  verbal consent for treatment and assignment of benefits for services provided during this visit. Patient/Guardian expressed  understanding and agreed to proceed.   [place], LCSW 11/27/2022

## 2022-11-27 NOTE — Progress Notes (Signed)
Pt fell off of her pony approximately 3 weeks ago. When she fell she landed on some gravel onto her L arm into some gravel. She was given Keflex and has since completed this

## 2022-12-03 ENCOUNTER — Ambulatory Visit (INDEPENDENT_AMBULATORY_CARE_PROVIDER_SITE_OTHER): Payer: Self-pay | Admitting: Pharmacist

## 2022-12-03 DIAGNOSIS — E785 Hyperlipidemia, unspecified: Secondary | ICD-10-CM

## 2022-12-03 DIAGNOSIS — E119 Type 2 diabetes mellitus without complications: Secondary | ICD-10-CM

## 2022-12-03 NOTE — Progress Notes (Signed)
Chronic Care Management Pharmacy Note  12/03/2022 Name:  Frances [name] MRN:  [id number] DOB:  03-08-1976  Summary: addressed HTN, HLD, DM. A1c doing extremely well (5.2), tolerating ozempic 0.22m weekly without issues.  She reports her current medication supply through patient assistance is going well.  BG: 90-100s  Recommendations/Changes made from today's visit: Continue current regimen, no changes. Patient will monitor her mail for 2024 re-enrollment of ozempic patient assistance, fill out patient portion, and provide to our office.  Plan: f/u with pharmacist in 2 months  Subjective: Frances BAYLESSis an 46y.o. year old female who is a primary patient of BDonella Stade PA-C.  The CCM team was consulted for assistance with disease management and care coordination needs.    Engaged with patient face to face for follow up visit in response to provider referral for pharmacy case management and/or care coordination services.   Consent to Services:  The patient was given information about Chronic Care Management services, agreed to services, and gave verbal consent prior to initiation of services.  Please see initial visit note for detailed documentation.   Patient Care Team: BLavada Mesias PCP - General (Family Medicine) LLona Millard MD as Referring Physician (Rheumatology)  Objective:  Lab Results  Component Value Date   CREATININE 0.89 09/10/2022   CREATININE 0.7 03/08/2021   CREATININE 0.8 02/20/2021    Lab Results  Component Value Date   HGBA1C 5.2 09/09/2022       Component Value Date/Time   CHOL 100 09/10/2022 1409   TRIG 103 09/10/2022 1409   HDL 40 09/10/2022 1409   CHOLHDL 2.5 09/10/2022 1409   CHOLHDL 4.6 04/14/2018 0822   LDLCALC 41 09/10/2022 1409   LDLCALC 73 04/14/2018 0822       Latest Ref Rng & Units 09/10/2022    2:09 PM 03/08/2021   12:00 AM 02/20/2021   12:00 AM  Hepatic Function  Total Protein 6.0 - 8.5 g/dL 6.7      Albumin 3.9 - 4.9 g/dL 4.9  3.6     4.4      AST 0 - 40 IU/L 26  37     36      ALT 0 - 32 IU/L 31  58     45      Alk Phosphatase 44 - 121 IU/L 78  120     57      Total Bilirubin 0.0 - 1.2 mg/dL 0.7        This result is from an external source.    Lab Results  Component Value Date/Time   TSH 0.866 11/21/2020 09:33 AM   TSH 1.616 01/27/2018 07:35 AM   TSH 0.967 09/14/2012 02:20 PM       Latest Ref Rng & Units 09/10/2022    2:09 PM 03/08/2021   12:00 AM 02/20/2021   12:00 AM  CBC  WBC 3.4 - 10.8 x10E3/uL 10.8  6.6     6.7      Hemoglobin 11.1 - 15.9 g/dL 14.7  13.9     14.4      Hematocrit 34.0 - 46.6 % 42.4  40     41      Platelets 150 - 450 x10E3/uL 276  252     220         This result is from an external source.    No results found for: "VD25OH"  Clinical ASCVD: No  The ASCVD Risk score (Arnett  DK, et al., 2019) failed to calculate for the following reasons:   The valid total cholesterol range is 130 to 320 mg/dL     Social History   Tobacco Use  Smoking Status Never  Smokeless Tobacco Never   BP Readings from Last 3 Encounters:  11/27/22 117/81  11/17/22 (!) 148/95  11/08/22 (!) 127/90   Pulse Readings from Last 3 Encounters:  11/27/22 88  11/17/22 (!) 103  11/08/22 100   Wt Readings from Last 3 Encounters:  11/27/22 161 lb (73 kg)  11/17/22 166 lb (75.3 kg)  11/08/22 165 lb (74.8 kg)    Assessment: Review of patient past medical history, allergies, medications, health status, including review of consultants reports, laboratory and other test data, was performed as part of comprehensive evaluation and provision of chronic care management services.   SDOH:  (Social Determinants of Health) assessments and interventions performed:  SDOH Interventions    Flowsheet Row Video Visit from 11/25/2022 in [facility] Counselor from 10/23/2022 in [facility] Office Visit from 09/25/2022 in [place] Video Visit from 05/28/2022 in [facility] Counselor from 02/21/2022 in [facility] Video Visit from 01/13/2022 in [facility]  SDOH Interventions        Depression Interventions/Treatment  Currently on Treatment Currently on Treatment Counseling Currently on Treatment Referral to Psychiatry, Medication, Counseling Currently on Treatment       CCM Care Plan  Allergies  Allergen Reactions   Metaxalone Hives    Medications Reviewed Today     Reviewed by Hali Marry, MD (Physician) on 11/27/22 at 1152  Med List Status: <None>   Medication Order Taking? Sig Documenting Provider Last Dose Status Informant  albuterol (VENTOLIN HFA) 108 (90 Base) MCG/ACT inhaler [id number] No Take 2 puffs 15 minutes before exercise and as needed for shortness of breath. Donella Stade, PA-C Taking Active Self  ARIPiprazole (ABILIFY) 15 MG tablet [id number]  Take 1 tablet (15 mg total) by mouth daily. Salley Slaughter, NP  Active   atorvastatin (LIPITOR) 20 MG tablet [id number] No Take 1 tablet (20 mg total) by mouth daily. Donella Stade, PA-C Taking Active   Azelastine HCl 137 MCG/SPRAY SOLN [id number] No USE 2 SPRAYS INTO ALERNATE NOSTRILS TWICE DAILY Breeback, Jade L, PA-C Taking Active   baclofen (LIORESAL) 10 MG tablet [id number]  Take 1 tablet (10 mg total) by mouth 2 (two) times daily as needed for muscle spasms. Samuel Bouche, NP  Active   benztropine (COGENTIN) 0.5 MG tablet [id number]  Take 1 tablet (0.5 mg total) by mouth daily. Eulis Canner E, NP  Active   blood glucose meter kit and supplies KIT [id number] No Dispense based on patient and insurance preference. Use up to four times daily as directed. Donella Stade, PA-C Taking Active   buPROPion (WELLBUTRIN XL) 150 MG 24 hr tablet [id number] No Take 1 tablet (150 mg total) by mouth every morning. Salley Slaughter, NP  Taking Active   buPROPion (WELLBUTRIN XL) 300 MG 24 hr tablet [id number]  Take 1 tablet (300 mg total) by mouth daily. Salley Slaughter, NP  Active   cephALEXin (KEFLEX) 500 MG capsule [id number]  Take 1 capsule (500 mg total) by mouth 3 (three) times daily. Samuel Bouche, NP  Active   cholecalciferol (VITAMIN D3) 25 MCG (1000 UT) tablet [id number] No Take 1,000 Units by mouth daily. [provider] Taking Active   diclofenac (VOLTAREN) 75 MG EC tablet [id number] No Take 1 tablet (75 mg total) by mouth 2 (two) times daily. Raylene Everts, MD Taking Active   DULoxetine HCl 30 MG CSDR [id number] No Take 90 mg by mouth daily. [provider] Taking Active   fluticasone (FLONASE) 50 MCG/ACT nasal spray [id number] No USE 2 SPRAYS IN EACH NOSTRIL EVERY DAY Breeback, Jade L, PA-C Taking Active   HYDROcodone-acetaminophen (NORCO/VICODIN) 5-325 MG tablet [id number]  Take 1 tablet by mouth 2 (two) times daily as needed for moderate pain (back pain). Hali Marry, MD  Active   levocetirizine (XYZAL) 5 MG tablet [id number] No Take 1 tablet (5 mg total) by mouth every evening. Donella Stade, PA-C Taking Active   metFORMIN (GLUCOPHAGE) 1000 MG tablet [id number] No TAKE 1 Tablet  BY MOUTH TWICE DAILY WITH A MEAL Breeback, Jade L, PA-C Taking Active   montelukast (SINGULAIR) 10 MG tablet [id number] No TAKE 1 Tablet BY MOUTH ONCE EVERY NIGHT AT BEDTIME Breeback, Jade L, PA-C Taking Active   pregabalin (LYRICA) 75 MG capsule [id number] No TAKE 1 CAPSULE BY MOUTH TWICE A DAY Silverio Decamp, MD Taking Active   Semaglutide,0.25 or 0.5MG/DOS, (OZEMPIC, 0.25 OR 0.5 MG/DOSE,) 2 MG/1.5ML SOPN [id number] No Inject 0.5 mg into the skin once a week. 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   Chronic bilateral low back  pain without sciatica 09/25/2022   Microalbuminuria 09/09/2022   Night sweats 06/18/2022   DEGENERATIVE DISC DISEASE, LUMBAR SPINE 04/07/2022   Proteinuria 03/17/2022   Chronic right hip pain 01/06/2022   ETD (Eustachian tube dysfunction), bilateral 08/23/2021   SOB (shortness of breath) on exertion 08/20/2021   Overweight (BMI 25.0-29.9) 06/18/2021   Attention deficit hyperactivity disorder (ADHD), 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   Hyperlipidemia LDL goal <70 03/18/2021   Type 2 diabetes mellitus without complication, without long-term current use of insulin ([place]) 03/13/2021   Seasonal allergies 12/25/2020   Bilateral leg edema 11/19/2020   Symptomatic mammary hypertrophy 09/04/2020   Recurrent major depressive disorder, in partial remission ([place]) 05/24/2020   Generalized anxiety disorder 05/24/2020   DDD (degenerative disc disease), cervical 03/13/2020   Ingrown toenail of both feet 03/13/2020   Labral tear of hip, degenerative, right 01/11/2020   Grief reaction 11/28/2019   Elevated fasting glucose 04/08/2018   Borderline personality disorder (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   Lumbar spinal stenosis 02/10/2008   FIBROMYALGIA 02/10/2008   IRON, SERUM, ELEVATED 02/10/2008    Immunization History  Administered Date(s) Administered   Influenza Whole 08/08/2011   Influenza,inj,Quad PF,6+ Mos 10/13/2018, 09/09/2019, 08/13/2020, 09/11/2021, 09/09/2022   Influenza-Unspecified 03/04/2016, 09/09/2019, 08/13/2020   Moderna Covid-19 Vaccine Bivalent Booster 85yr & up 10/22/2021   Moderna Sars-Covid-2 Vaccination 10/02/2020, 10/30/2020, 04/16/2021   Pneumococcal Conjugate-13 04/07/2018    Pneumococcal Polysaccharide-23 12/09/2018   Td 02/19/2006   Td (Adult), 2 Lf Tetanus Toxid, Preservative Free 02/19/2006   Tdap 02/04/2018  Conditions to be addressed/monitored: HTN, HLD, and DMII  There are no care plans that you recently modified to display for this patient.      Medication Assistance:  Ozempic obtained through [place] medication assistance program.  Enrollment ends 2022  Patient's preferred pharmacy is:  [facility] [address], [place] [address] Phone: [phone] Fax: [phone]  [facility], [address], [place], [place] [address] Phone: [phone] Fax: [phone] - [place], [place] [address] [place] [address] Phone: [phone] Fax: [phone]    Follow Up:  Patient agrees to Care Plan and Follow-up.  Plan: Telephone follow up appointment with care management team member scheduled for:  2 months  Larinda Buttery, PharmD Clinical Pharmacist [facility] [phone]

## 2022-12-09 ENCOUNTER — Encounter: Payer: Self-pay | Admitting: Physician Assistant

## 2022-12-09 ENCOUNTER — Ambulatory Visit (INDEPENDENT_AMBULATORY_CARE_PROVIDER_SITE_OTHER): Payer: Self-pay | Admitting: Physician Assistant

## 2022-12-09 ENCOUNTER — Ambulatory Visit (INDEPENDENT_AMBULATORY_CARE_PROVIDER_SITE_OTHER): Payer: No Payment, Other | Admitting: Clinical

## 2022-12-09 VITALS — BP 125/78 | HR 96 | Ht 67.0 in | Wt 166.0 lb

## 2022-12-09 DIAGNOSIS — F319 Bipolar disorder, unspecified: Secondary | ICD-10-CM

## 2022-12-09 DIAGNOSIS — Z23 Encounter for immunization: Secondary | ICD-10-CM

## 2022-12-09 DIAGNOSIS — E119 Type 2 diabetes mellitus without complications: Secondary | ICD-10-CM

## 2022-12-09 DIAGNOSIS — M542 Cervicalgia: Secondary | ICD-10-CM

## 2022-12-09 LAB — POCT GLYCOSYLATED HEMOGLOBIN (HGB A1C): Hemoglobin A1C: 5.4 % (ref 4.0–5.6)

## 2022-12-09 MED ORDER — BACLOFEN 10 MG PO TABS: 10.0000 mg | ORAL_TABLET | Freq: Two times a day (BID) | ORAL | 1 refills | Status: DC | PRN

## 2022-12-09 NOTE — Progress Notes (Signed)
Established Patient Office Visit  Subjective   Patient ID: Frances [name], female    DOB: 05/29/1976  Age: 46 y.o. MRN: [id number]  Chief Complaint  Patient presents with   Follow-up    diabetes    HPI Pt is a 46 yo female with T2DM, RA, Bipolar 1, chronic pain due to DDD who presents to the clinic for 3 month follow up.   Pt did not report any BS taken. She is taking ozempic weekly and metformin daily. She denies any hypoglycemic events. She did have a fall and abrasion to left elbow. It did get infected. No other open sores or wounds. Denies any CP, palpitations, headaches or vision changes.   She would like refill on baclofen. She does report some worsening neck pain and stiffness but no new injury.   .. 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   Lumbar spinal stenosis 02/10/2008   Back pain 10/13/2008   FIBROMYALGIA 02/10/2008   IRON, SERUM, ELEVATED 02/10/2008   Bipolar I disorder ([place]) 09/16/2017   Chronic fatigue syndrome 09/16/2017   MTHFR gene mutation 09/20/2017   New daily persistent headache 09/26/2017   Rheumatoid arthritis 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 ([place]) 12/09/2017   Borderline personality disorder ([place]) 01/22/2018   Elevated fasting glucose 04/08/2018   Grief reaction 11/28/2019   Labral tear of hip, degenerative, right 01/11/2020   DDD (degenerative disc disease), cervical 03/13/2020   Ingrown toenail of both feet 03/13/2020   Recurrent major depressive disorder, in partial remission ([place]) 05/24/2020   Generalized anxiety disorder 05/24/2020   Symptomatic mammary hypertrophy 09/04/2020   Bilateral leg edema 11/19/2020   Seasonal allergies 12/25/2020   Type 2 diabetes mellitus without complication, without long-term current use of insulin ([place]) 03/13/2021   Hyperlipidemia LDL goal <70 03/18/2021   Attention deficit hyperactivity disorder (ADHD), predominantly inattentive type  04/24/2021   Overweight (BMI 25.0-29.9) 06/18/2021   SOB (shortness of breath) on exertion 08/20/2021   ETD (Eustachian tube dysfunction), bilateral 08/23/2021   Chronic right hip pain 01/06/2022   Proteinuria 03/17/2022   DEGENERATIVE DISC DISEASE, LUMBAR SPINE 04/07/2022   Night sweats 06/18/2022   Microalbuminuria 09/09/2022   Chronic bilateral low back pain without sciatica 09/25/2022   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   Resolved Ambulatory Problems    Diagnosis Date Noted   FEVER, RECURRENT 04/02/2009   Hypothyroidism 02/10/2008   SINUSITIS- ACUTE-NOS 03/24/2008   URI 01/17/2009   DERMATITIS, ALLERGIC 06/05/2008   ANKLE PAIN, BILATERAL 07/28/2008   FATIGUE 05/05/2008   OPEN WOUND FT NO TOE ALONE WITHOUT MENTION COMP 03/28/2009   DYSPNEA 10/15/2011   Fibromyalgia 09/16/2017   TMJ (temporomandibular joint syndrome) 09/26/2017   Anxiety 01/22/2018   Depression    Bipolar 1 disorder, depressed, severe ([place]) 01/25/2018   Decreased visual acuity 08/30/2018   Right corneal abrasion 08/30/2018   Toenail fungus 09/09/2019   Vasovagal response 09/09/2019   Chronic right-sided low back pain without sciatica 01/11/2020   Macromastia 06/29/2020   Xiphoid pain 08/13/2020   Sternum pain 08/13/2020   Class 1 obesity due to excess calories without serious comorbidity with body mass index (BMI) of 32.0 to 32.9 in adult 08/13/2020   Costochondritis 10/22/2020   Rib pain on right side 10/22/2020   Tachycardia 10/22/2020   Hepatomegaly 10/22/2020   Chest congestion 11/20/2020   Sinus drainage 11/20/2020   Ear popping, bilateral 08/20/2021   Right lateral epicondylitis 12/31/2021  Acute pain of right shoulder 01/06/2022   Weak urine stream 03/17/2022   Acute left-sided low back pain without sciatica 04/07/2022   Acute midline low back pain without sciatica 04/07/2022   Irritant contact dermatitis due to plants, except food 06/18/2022   Ingrown toenail of right foot 09/09/2022    Past Medical History:  Diagnosis Date   Arthritis    Back pain, chronic    Bipolar 1 disorder (HCC)    DDD (degenerative disc disease), lumbar    Diabetes mellitus without complication ([place])    IUD 2012     Review of Systems  All other systems reviewed and are negative.     Objective:     BP 125/78 (BP Location: Right Arm, Patient Position: Sitting, Cuff Size: Normal)   Pulse 96   Ht '5\' 7"'$  (1.702 m)   Wt 166 lb 0.6 oz (75.3 kg)   SpO2 97%   BMI 26.01 kg/m  BP Readings from Last 3 Encounters:  12/09/22 125/78  11/27/22 117/81  11/17/22 (!) 148/95   Wt Readings from Last 3 Encounters:  12/09/22 166 lb 0.6 oz (75.3 kg)  11/27/22 161 lb (73 kg)  11/17/22 166 lb (75.3 kg)      Physical Exam Constitutional:      Appearance: Normal appearance.  HENT:     Head: Normocephalic.  Neck:     Vascular: No carotid bruit.  Cardiovascular:     Rate and Rhythm: Normal rate and regular rhythm.  Pulmonary:     Effort: Pulmonary effort is normal.  Musculoskeletal:     Right lower leg: No edema.     Left lower leg: No edema.  Neurological:     General: No focal deficit present.     Mental Status: She is alert and oriented to person, place, and time.  Psychiatric:        Mood and Affect: Mood normal.      Results for orders placed or performed in visit on 12/09/22  POCT glycosylated hemoglobin (Hb A1C)  Result Value Ref Range   Hemoglobin A1C 5.4 4.0 - 5.6 %   HbA1c POC (<> result, manual entry)     HbA1c, POC (prediabetic range)     HbA1c, POC (controlled diabetic range)          Assessment & Plan:  Marland KitchenMarland KitchenGerlean was seen today for follow-up.  Diagnoses and all orders for this visit:  Type 2 diabetes mellitus without complication, without long-term current use of insulin (HCC) -     POCT glycosylated hemoglobin (Hb A1C)  Neck pain -     baclofen (LIORESAL) 10 MG tablet; Take 1 tablet (10 mg total) by mouth 2 (two) times daily as needed for muscle  spasms.    A1C to goal Continue on ozempic and metformin BP to goal On statin Foot and eye exam to goal Covid booster today  Flu and pneumonia UTD Follow up in 3 months  Baclofen sent for muscle relaxer   [place] Planas, PA-C

## 2022-12-11 NOTE — Progress Notes (Signed)
THERAPIST PROGRESS NOTE Virtual Visit via Video Note  I connected with Frances [name] on 12/09/2022 at  1:00 PM EST by a video enabled telemedicine application and verified that I am speaking with the correct person using two identifiers.  Location: Patient: home Provider: office   I discussed the limitations of evaluation and management by telemedicine and the availability of in person appointments. The patient expressed understanding and agreed to proceed.   Follow Up Instructions: I discussed the assessment and treatment plan with the patient. The patient was provided an opportunity to ask questions and all were answered. The patient agreed with the plan and demonstrated an understanding of the instructions.   The patient was advised to call back or seek an in-person evaluation if the symptoms worsen or if the condition fails to improve as anticipated.   Session Time: 40 minutes  Participation Level: Active  Behavioral Response: CasualAlertDepressed  Type of Therapy: Individual Therapy  Treatment Goals addressed: client will identify 3 cognitive patterns and beliefs that support depression  ProgressTowards Goals: Progressing  Interventions: CBT and Supportive  Summary:  Frances [name] is a 46 y.o. female who presents for the scheduled appointment oriented times five, appropriately dressed, and friendly. Client denied hallucinations and delusions. Client reported she is doing okay. Client reported she completed her SSI application with a Education officer, museum from [place]. Client reported a friend took her for a drive to see Christmas lights in several towns and enjoyed herself. Client reported she had some other plans with another female friend but unsure if they will do it. Client reported she does not look to do much during the holidays since her family is so spread out. Client reported she has had a hard time this year with grief thinking of her mother who  passed in 2020.  Client reported thinking she'd wish she went back home after her mother passed to collect some of her things for keeping. Client reported her brother let the apartment do away with majority of her things. Client reported she has gone through some of her own things and a found a birthday card from her mom and a picture of them together. Client reported she has not been back to [place] since 2019. Client reported she talked to her mom on the phone before she passed. Client reported she plans to spend Christmas with some friends possibly.    Suicidal/Homicidal: Nowithout intent/plan  Therapist Response:  Therapist began the appointment asking the client how she has been doing since last seen. Therapist used CBT to engage using active listening and positive emotional support. Therapist used CBT to give the client time to discuss positive outlets of people and activity that helps elevate her mood. Therapist used CBT to discuss grief and normalizing her emotions. Therapist used CBT ask the client to identify her progress with frequency of use with coping skills with continued practice in her daily activity.    Therapist assigned the client homework to practice self care.    Plan: Return again in 3 weeks.  Diagnosis: bipolar 1 disorder  Collaboration of Care: Patient refused AEB none requested by the client.  Patient/Guardian was advised Release of Information must be obtained prior to any record release in order to collaborate their care with an outside provider. Patient/Guardian was advised if they have not already done so to contact the registration department to sign all necessary forms in order for [place] to release information regarding their care.   Consent: Patient/Guardian gives verbal  consent for treatment and assignment of benefits for services provided during this visit. Patient/Guardian expressed understanding and agreed to proceed.   [place], LCSW 12/09/2022

## 2022-12-18 ENCOUNTER — Ambulatory Visit (INDEPENDENT_AMBULATORY_CARE_PROVIDER_SITE_OTHER): Payer: Self-pay | Admitting: Sports Medicine

## 2022-12-18 ENCOUNTER — Other Ambulatory Visit: Payer: Self-pay | Admitting: Sports Medicine

## 2022-12-18 ENCOUNTER — Ambulatory Visit (INDEPENDENT_AMBULATORY_CARE_PROVIDER_SITE_OTHER): Payer: Self-pay

## 2022-12-18 DIAGNOSIS — M47816 Spondylosis without myelopathy or radiculopathy, lumbar region: Secondary | ICD-10-CM

## 2022-12-18 DIAGNOSIS — S4491XA Injury of unspecified nerve at shoulder and upper arm level, right arm, initial encounter: Secondary | ICD-10-CM

## 2022-12-18 DIAGNOSIS — M0600 Rheumatoid arthritis without rheumatoid factor, unspecified site: Secondary | ICD-10-CM

## 2022-12-18 MED ORDER — PREGABALIN 100 MG PO CAPS: 100.0000 mg | ORAL_CAPSULE | Freq: Two times a day (BID) | ORAL | 3 refills | Status: DC

## 2022-12-18 MED ORDER — PREDNISONE 50 MG PO TABS: ORAL_TABLET | ORAL | 0 refills | Status: DC

## 2022-12-18 NOTE — Progress Notes (Signed)
    Procedures performed today:    None.  Independent interpretation of notes and tests performed by another provider:   None.  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 She has not responded to multiple medications including steroids, Lyrica, narcotics. She has had physical therapy without sufficient improvement, she did get a consultation with another spine surgeon who suggested nonoperative intervention and pain management. She is back here after a acute on chronic increase in her back pain after having a small fall. Adding prednisone, bumping Lyrica up to 100 mg twice daily, x-rays. Return to see me on an as-needed basis. She does get regular codon prescriptions from her PCP. If we are unable to get adequate control of her back pain after a titration to the max tolerable dose of Lyrica we will consider bumping up the hydrocodone.  Neuropraxia of right thumb [name] does continue to have some paresthesias into her hands, swelling with fullness, pain worse at night and in the mornings, suspect carpal tunnel syndrome. She had more of a right thumb neuropraxia at a previous visit. I suggested we consider nerve conduction and EMG to confirm the diagnosis before considering median nerve hydrodissections, she will think about it and let me know.  Seronegative rheumatoid arthritis [facility]) Comanaged by Dr. Candis Schatz at [facility], he is a former Careers information officer of mine.    ____________________________________________ Gwen Her. Dianah Field, M.D., ABFM., CAQSM., AME. Primary Care and Sports Medicine [employer] [facility]  Adjunct Professor of [place] of [facility] of Medicine  Engineer, structural

## 2022-12-18 NOTE — Assessment & Plan Note (Addendum)
Frances [name] does continue to have some paresthesias into her hands, swelling with fullness, pain worse at night and in the mornings, suspect carpal tunnel syndrome. She had more of a right thumb neuropraxia at a previous visit. I suggested we consider nerve conduction and EMG to confirm the diagnosis before considering median nerve hydrodissections, she will think about it and let me know.

## 2022-12-18 NOTE — Assessment & Plan Note (Signed)
Comanaged by Dr. Candis Schatz at [facility], he is a former Careers information officer of mine.

## 2022-12-18 NOTE — Assessment & Plan Note (Signed)
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 She has not responded to multiple medications including steroids, Lyrica, narcotics. She has had physical therapy without sufficient improvement, she did get a consultation with another spine surgeon who suggested nonoperative intervention and pain management. She is back here after a acute on chronic increase in her back pain after having a small fall. Adding prednisone, bumping Lyrica up to 100 mg twice daily, x-rays. Return to see me on an as-needed basis. She does get regular codon prescriptions from her PCP. If we are unable to get adequate control of her back pain after a titration to the max tolerable dose of Lyrica we will consider bumping up the hydrocodone.

## 2022-12-19 MED FILL — HYDROXYCHLOROQUINE 200 MG TABLET: ORAL | 30 days supply | Qty: 60 | Fill #5

## 2022-12-19 MED FILL — DULOXETINE 30 MG CAPSULE,DELAYED RELEASE: ORAL | 30 days supply | Qty: 90 | Fill #3

## 2022-12-19 MED FILL — DICLOFENAC SODIUM 75 MG TABLET,DELAYED RELEASE: ORAL | 30 days supply | Qty: 60 | Fill #2

## 2022-12-30 ENCOUNTER — Other Ambulatory Visit: Payer: Self-pay | Admitting: Physician Assistant

## 2022-12-30 DIAGNOSIS — Z1231 Encounter for screening mammogram for malignant neoplasm of breast: Secondary | ICD-10-CM

## 2023-01-08 ENCOUNTER — Ambulatory Visit (INDEPENDENT_AMBULATORY_CARE_PROVIDER_SITE_OTHER): Payer: No Payment, Other | Admitting: Clinical

## 2023-01-08 DIAGNOSIS — F319 Bipolar disorder, unspecified: Secondary | ICD-10-CM | POA: Diagnosis not present

## 2023-01-08 NOTE — Progress Notes (Signed)
   THERAPIST PROGRESS NOTE Virtual Visit via Video Note  I connected with Para March on 01/08/2023 at  1:00 PM EST by a video enabled telemedicine application and verified that I am speaking with the correct person using two identifiers.  Location: Patient: home Provider: office   I discussed the limitations of evaluation and management by telemedicine and the availability of in person appointments. The patient expressed understanding and agreed to proceed.   Follow Up Instructions: I discussed the assessment and treatment plan with the patient. The patient was provided an opportunity to ask questions and all were answered. The patient agreed with the plan and demonstrated an understanding of the instructions.   The patient was advised to call back or seek an in-person evaluation if the symptoms worsen or if the condition fails to improve as anticipated.   Session Time: 25 minutes  Participation Level: Active  Behavioral Response: CasualAlertEuthymic  Type of Therapy: Individual Therapy  Treatment Goals addressed: client will score less than a 10 on the PHQ9  ProgressTowards Goals: Progressing  Interventions: CBT and Supportive  Summary:  AMITA ATAYDE is a 47 y.o. female who presents for scheduled appointment oriented x 5, appropriately dressed, friendly.  Client denies hallucinations and delusions. Client reported on today she is doing "much better" from when she was last seen.  Client reported the validate season helped to improve her mood.  Client reported she also met with the psychiatrist and her dosage of Wellbutrin was decreased which has seemed to help as well. Client reported she has been having chronic pain in her hands. Client reported she is not able to make a fist and will be going to see a specialist. Client reported she has continued to attend her group therapy sessions weekly with [employer]. Client reported they went over how to improve self  talk. Client reported that she already does. Client reported she will continue working on her eating habits but it is hard for her to eat vegetables. Evidence of progress towards goal:  client reported medication compliance 7 days per week. Client reported engaging in positive behavioral activation at least 3x per week.   Suicidal/Homicidal: Nowithout intent/plan  Therapist Response:  Therapist began the appointment asking the client how she is doing since last seen. Therapist used CBT to engage using active listening and positive emotional support. Therapist used CBT to engage and ask the client about her reaction to medication adjustment. Therapist used CBT to engage and ask the client about changes to her depressive symptoms since coming out of the holiday season. Therapist used CBT to reinforce positive coping skills and incorporating healthier eating habits. Therapist used CBT ask the client to identify her progress with frequency of use with coping skills with continued practice in her daily activity.       Plan: Return again in 3 weeks.  Diagnosis: bipolar 1 disorder  Collaboration of Care: Patient refused AEB none requested by the client.  Patient/Guardian was advised Release of Information must be obtained prior to any record release in order to collaborate their care with an outside provider. Patient/Guardian was advised if they have not already done so to contact the registration department to sign all necessary forms in order for [place] to release information regarding their care.   Consent: Patient/Guardian gives verbal consent for treatment and assignment of benefits for services provided during this visit. Patient/Guardian expressed understanding and agreed to proceed.   [place], LCSW 01/08/2023

## 2023-01-09 ENCOUNTER — Other Ambulatory Visit: Payer: Self-pay | Admitting: Physician Assistant

## 2023-01-09 DIAGNOSIS — J302 Other seasonal allergic rhinitis: Secondary | ICD-10-CM

## 2023-01-09 DIAGNOSIS — J310 Chronic rhinitis: Secondary | ICD-10-CM

## 2023-01-09 DIAGNOSIS — R0989 Other specified symptoms and signs involving the circulatory and respiratory systems: Secondary | ICD-10-CM

## 2023-01-09 DIAGNOSIS — J3489 Other specified disorders of nose and nasal sinuses: Secondary | ICD-10-CM

## 2023-01-13 ENCOUNTER — Ambulatory Visit: Payer: Self-pay | Admitting: Sports Medicine

## 2023-01-13 ENCOUNTER — Encounter: Payer: Self-pay | Admitting: Physician Assistant

## 2023-01-13 DIAGNOSIS — G8929 Other chronic pain: Secondary | ICD-10-CM

## 2023-01-13 DIAGNOSIS — M0609 Rheumatoid arthritis without rheumatoid factor, multiple sites: Secondary | ICD-10-CM

## 2023-01-13 DIAGNOSIS — M5136 Other intervertebral disc degeneration, lumbar region: Secondary | ICD-10-CM

## 2023-01-13 MED ORDER — HYDROCODONE-ACETAMINOPHEN 5-325 MG PO TABS: 1.0000 | ORAL_TABLET | Freq: Two times a day (BID) | ORAL | 0 refills | Status: DC | PRN

## 2023-01-14 ENCOUNTER — Ambulatory Visit: Payer: Self-pay

## 2023-01-14 ENCOUNTER — Telehealth: Payer: Self-pay

## 2023-01-14 MED ORDER — HYDROCODONE-ACETAMINOPHEN 5-325 MG PO TABS: 1.0000 | ORAL_TABLET | Freq: Two times a day (BID) | ORAL | 0 refills | Status: DC | PRN

## 2023-01-14 NOTE — Addendum Note (Signed)
Addended by: Narda Rutherford on: 01/14/2023 07:12 AM   Modules accepted: Orders

## 2023-01-14 NOTE — Addendum Note (Signed)
Addended by: Donella Stade on: 01/14/2023 01:03 PM   Modules accepted: Orders

## 2023-01-14 NOTE — Telephone Encounter (Addendum)
Initiated Prior authorization [id number] 5-325MG tablets Via: fax only [place] [id number] Status: approved  as of 01/14/23 Reason:approved till 04/27/23 Notified Pt via: Mychart

## 2023-01-15 ENCOUNTER — Ambulatory Visit
Admit: 2023-01-15 | Discharge: 2023-01-16 | Payer: MEDICAID | Attending: Student in an Organized Health Care Education/Training Program | Primary: Student in an Organized Health Care Education/Training Program

## 2023-01-15 DIAGNOSIS — R200 Anesthesia of skin: Principal | ICD-10-CM

## 2023-01-15 DIAGNOSIS — M0600 Rheumatoid arthritis without rheumatoid factor, unspecified site: Principal | ICD-10-CM

## 2023-01-15 DIAGNOSIS — M79641 Pain in right hand: Principal | ICD-10-CM

## 2023-01-15 DIAGNOSIS — M79642 Pain in left hand: Principal | ICD-10-CM

## 2023-01-16 ENCOUNTER — Ambulatory Visit (INDEPENDENT_AMBULATORY_CARE_PROVIDER_SITE_OTHER): Payer: Medicaid Other | Admitting: Sports Medicine

## 2023-01-16 DIAGNOSIS — M47816 Spondylosis without myelopathy or radiculopathy, lumbar region: Secondary | ICD-10-CM

## 2023-01-16 DIAGNOSIS — S4491XA Injury of unspecified nerve at shoulder and upper arm level, right arm, initial encounter: Secondary | ICD-10-CM | POA: Diagnosis not present

## 2023-01-16 MED ORDER — PREGABALIN 150 MG PO CAPS: 150.0000 mg | ORAL_CAPSULE | Freq: Two times a day (BID) | ORAL | 3 refills | Status: DC

## 2023-01-16 NOTE — Assessment & Plan Note (Signed)
Very pleasant 47 year old female, multilevel lumbar spinal stenosis moderate at the L3-L4 level, she has had multiple interventions including epidurals, facet injections, medial branch blocks, radiofrequency ablations, she has not responded to multiple medications including steroids, narcotics. Physical therapy did not provide sufficient relief. Spine surgery did not suggest operative intervention. At the last visit we bumped her Lyrica up to 100 mg twice a day, has not yet noticed any improvement in the baseline back pain. She also gets regular hydrocodone prescriptions from PCP. We will give the Lyrica the due diligence of an up taper to the maximum dose before increasing hydrocodone dose. Increasing Lyrica to 150 mg twice daily.

## 2023-01-16 NOTE — Progress Notes (Signed)
    Procedures performed today:    None.  Independent interpretation of notes and tests performed by another provider:   None.  Brief History, Exam, Impression, and Recommendations:    Lumbar spondylosis Very pleasant 47 year old female, multilevel lumbar spinal stenosis moderate at the L3-L4 level, she has had multiple interventions including epidurals, facet injections, medial branch blocks, radiofrequency ablations, she has not responded to multiple medications including steroids, narcotics. Physical therapy did not provide sufficient relief. Spine surgery did not suggest operative intervention. At the last visit we bumped her Lyrica up to 100 mg twice a day, has not yet noticed any improvement in the baseline back pain. She also gets regular hydrocodone prescriptions from PCP. We will give the Lyrica the due diligence of an up taper to the maximum dose before increasing hydrocodone dose. Increasing Lyrica to 150 mg twice daily.   Neuropraxia of right thumb Achiness and neuropraxia both hands, thumb is doing a little bit better, we will proceed with nerve conduction and EMG.  Chronic process not at goal with pharmacologic intervention  ____________________________________________ Gwen Her. Dianah Field, M.D., ABFM., CAQSM., AME. Primary Care and Sports Medicine [place] [facility]  Adjunct Professor of [place] of [facility] of Medicine  Risk manager

## 2023-01-16 NOTE — Assessment & Plan Note (Signed)
Achiness and neuropraxia both hands, thumb is doing a little bit better, we will proceed with nerve conduction and EMG.

## 2023-01-20 ENCOUNTER — Ambulatory Visit (INDEPENDENT_AMBULATORY_CARE_PROVIDER_SITE_OTHER): Payer: Medicaid Other | Admitting: Clinical

## 2023-01-20 DIAGNOSIS — F319 Bipolar disorder, unspecified: Secondary | ICD-10-CM

## 2023-01-20 DIAGNOSIS — M797 Fibromyalgia: Principal | ICD-10-CM

## 2023-01-20 DIAGNOSIS — M0600 Rheumatoid arthritis without rheumatoid factor, unspecified site: Principal | ICD-10-CM

## 2023-01-20 MED ORDER — DULOXETINE 30 MG CAPSULE,DELAYED RELEASE
ORAL_CAPSULE | Freq: Every day | ORAL | 3 refills | 90 days | Status: CP
Start: 2023-01-20 — End: ?

## 2023-01-20 MED ORDER — DICLOFENAC SODIUM 75 MG TABLET,DELAYED RELEASE
ORAL_TABLET | Freq: Two times a day (BID) | ORAL | 3 refills | 90 days | Status: CP
Start: 2023-01-20 — End: ?

## 2023-01-20 MED ORDER — HYDROXYCHLOROQUINE 200 MG TABLET
ORAL_TABLET | Freq: Two times a day (BID) | ORAL | 3 refills | 90 days | Status: CP
Start: 2023-01-20 — End: 2024-01-20

## 2023-01-21 ENCOUNTER — Encounter: Payer: Self-pay | Admitting: Physician Assistant

## 2023-01-21 DIAGNOSIS — N6320 Unspecified lump in the left breast, unspecified quadrant: Secondary | ICD-10-CM

## 2023-01-22 ENCOUNTER — Telehealth: Payer: Self-pay

## 2023-01-22 NOTE — Telephone Encounter (Signed)
Patient made aware.

## 2023-01-22 NOTE — Telephone Encounter (Signed)
[employer]ovo [employer] PAP shipment for Ozempic 2 mg/98m (5 boxes) received this morning. Please contact the patient to come and pick up their order today. Placed in the fridge with patient identifier. Thanks in advance.   [place] [id number]: [id number]: 2024-10-21

## 2023-01-25 DIAGNOSIS — M0600 Rheumatoid arthritis without rheumatoid factor, unspecified site: Principal | ICD-10-CM

## 2023-01-26 ENCOUNTER — Telehealth (INDEPENDENT_AMBULATORY_CARE_PROVIDER_SITE_OTHER): Payer: Medicaid Other | Admitting: Psychiatry

## 2023-01-26 ENCOUNTER — Encounter (HOSPITAL_COMMUNITY): Payer: Self-pay | Admitting: Psychiatry

## 2023-01-26 DIAGNOSIS — F900 Attention-deficit hyperactivity disorder, predominantly inattentive type: Secondary | ICD-10-CM

## 2023-01-26 DIAGNOSIS — F319 Bipolar disorder, unspecified: Secondary | ICD-10-CM

## 2023-01-26 MED ORDER — ARIPIPRAZOLE 15 MG PO TABS: 15.0000 mg | ORAL_TABLET | Freq: Every day | ORAL | 3 refills | Status: DC

## 2023-01-26 MED ORDER — BENZTROPINE MESYLATE 0.5 MG PO TABS: 0.5000 mg | ORAL_TABLET | Freq: Every day | ORAL | 3 refills | Status: DC

## 2023-01-26 MED ORDER — BUPROPION HCL ER (XL) 300 MG PO TB24: 300.0000 mg | ORAL_TABLET | Freq: Every day | ORAL | 3 refills | Status: DC

## 2023-01-26 NOTE — Progress Notes (Signed)
THERAPIST PROGRESS NOTE Virtual Visit via Video Note  I connected with Para March on 01/20/2023 at  1:00 PM EST by a video enabled telemedicine application and verified that I am speaking with the correct person using two identifiers.  Location: Patient: home Provider: office   I discussed the limitations of evaluation and management by telemedicine and the availability of in person appointments. The patient expressed understanding and agreed to proceed.   Follow Up Instructions: I discussed the assessment and treatment plan with the patient. The patient was provided an opportunity to ask questions and all were answered. The patient agreed with the plan and demonstrated an understanding of the instructions.   The patient was advised to call back or seek an in-person evaluation if the symptoms worsen or if the condition fails to improve as anticipated.   Session Time: 30 minutes  Participation Level: Active  Behavioral Response: CasualAlertEuthymic  Type of Therapy: Individual Therapy  Treatment Goals addressed: client will score less than a 10 on the PHQ-9  ProgressTowards Goals: Progressing  Interventions: CBT and Supportive  Summary:  ZSAZSA BAHENA is a 47 y.o. female who presents for the scheduled appointment oriented times five, appropriately dressed, and friendly. Client denied hallucinations and delusions. Client reported she is doing fairly good. Client reported she has been able to enjoy some time sitting outside beside the weather has been nice. Client reported when it is gloomy it causes her to feel sad. Client reported she is happy that she was approved for the medicaid expansion. Client reported she spent a lot of time applying for financial assistance but is overall happy. Client reported she has been to the rheumatologist recently and they are considering a diagnosis of carpul tunnel. Client reported having a hard time having full range of motion  usage. Client reported she is also being followed by a sports medicine doctor. Client reported otherwise she is doing well with her medication adjustment from the psychiatrist. Evidence of progress towards goal:  client reported medication compliance 7 days per week. Client reported practicing behavioral activation at least 2x per week being outside to improve her mood.   Suicidal/Homicidal: Nowithout intent/plan  Therapist Response:  Therapist began the appointment asking the client how she has been doing since last seen. Therapist used CBT to engage using active listening and positive emotional support. Therapist used CBT to engage and ask the client about how her mood has been affected by ongoing health issues. Therapist used CBT to engage and ask the client about coping skills she uses for depression to improve her mood. Therapist used CBT to positively reinforce the clients use of behavioral activation. Therapist used CBT ask the client to identify her progress with frequency of use with coping skills with continued practice in her daily activity.    Therapist assigned the client homework to practice self care.    Plan: Return again in 3 weeks.  Diagnosis: bipolar 1 disorder  Collaboration of Care: Patient refused AEB none requested by the client.  Patient/Guardian was advised Release of Information must be obtained prior to any record release in order to collaborate their care with an outside provider. Patient/Guardian was advised if they have not already done so to contact the registration department to sign all necessary forms in order for [place] to release information regarding their care.   Consent: Patient/Guardian gives verbal consent for treatment and assignment of benefits for services provided during this visit. Patient/Guardian expressed understanding and agreed to proceed.  [place], LCSW 01/20/2023

## 2023-01-26 NOTE — Progress Notes (Signed)
BH MD/PA/NP OP Progress Note  Virtual Visit via Video Note  I connected with Frances [name] on 01/26/23 at 11:30 AM EST by a video enabled telemedicine application and verified that I am speaking with the correct person using two identifiers.  Location: Patient: Home Provider: Clinic   I discussed the limitations of evaluation and management by telemedicine and the availability of in person appointments. The patient expressed understanding and agreed to proceed.  I provided 30 minutes of non-face-to-face time during this encounter.           01/26/2023 11:48 AM Frances [name]  MRN:  [id number]  Chief Complaint: "I am doing okay"  HPI: 47 year old female seen today for follow up psychiatric evaluation. She has a psychiatric history of bipolar 1, borderline personality disorder, depression, and anxiety. She is currently managed on Abilify 15 mg nightly, Cogentin 0.5 mg daily, Wellbutrin 300 we will mg daily, and Cymbalta 90 mg daily (from rheumatologist). Today, patient notes that medications are somewhat effective in managing her symptoms.   Today the patient was well-groomed, pleasant, cooperative, engaged in eye contact, and engaged in conversation. She informed provider that she has been doing okay.  Patient reports that her mood is stable and notes that her depression and anxiety are well-managed.  Today provider conducted GAD-7 the patient scored a 2, at her last visit she scored a 2.  Provider also conducted PHQ-9 ans she scored an 6, at her last visit she scored a 11.  She adequate sleep and appetite.  Today she denies SI/HI/VAH, mania, paranoia.    Patient reports that she continues to have mild arm pain and numbness in her hands.  She informed Probation officer that she will have a nerve conduction test soon to rule out carpal tunnel or arthritis.  Patient notes that she takes pregabalin to help manage her pain.    Overall patient notes that she is doing well.  No medication  changes made today.  Patient agreeable to continue medications as prescribed.  No other concerns noted at this time.         Visit Diagnosis:    ICD-10-CM   1. Bipolar I disorder (HCC)  F31.9 ARIPiprazole (ABILIFY) 15 MG tablet    buPROPion (WELLBUTRIN XL) 300 MG 24 hr tablet    benztropine (COGENTIN) 0.5 MG tablet    2. Attention deficit hyperactivity disorder (ADHD), predominantly inattentive type  F90.0 buPROPion (WELLBUTRIN XL) 300 MG 24 hr tablet      Past Psychiatric History: Bipolar, anxiety, and depression Past Medical History:  Past Medical History:  Diagnosis Date   Anxiety    Arthritis    Back pain, chronic    Bipolar 1 disorder (HCC)    Chronic fatigue syndrome    DDD (degenerative disc disease), lumbar    Depression    Diabetes mellitus without complication ([place])    Fibromyalgia    IUD 2012   Mirena   MTHFR gene mutation     Past Surgical History:  Procedure Laterality Date   COLONOSCOPY WITH PROPOFOL N/A 02/25/2017   Procedure: COLONOSCOPY WITH PROPOFOL;  Surgeon: Arta Silence, MD;  Location: [facility];  Service: Endoscopy;  Laterality: N/A;   left knee lateral release  1993   scar tisue removal  03/2005   left ankle   TONSILLECTOMY  age 70's    Family Psychiatric History: Maternal aunts Bipolar disorder and uncle alcoholic  Family History:  Family History  Problem Relation Age of Onset  Diabetes Mother    Stroke Mother    Lupus Mother    Hypertension Mother    Congestive Heart Failure Mother    Mental illness Brother        Not clear what his diagnosis is--may be related to previous drug use.   Cancer Maternal Grandmother        colon   Diabetes Maternal Grandfather    Depression Maternal Uncle    Alcohol abuse Maternal Uncle     Social History:  Social History   Socioeconomic History   Marital status: Single    Spouse name: Not on file   Number of children: 0   Years of education: Not on file   Highest education level:  Bachelor's degree (e.g., BA, AB, BS)  Occupational History   Occupation: Designer, industrial/product at times  Tobacco Use   Smoking status: Never   Smokeless tobacco: Never  Vaping Use   Vaping Use: Never used  Substance and Sexual Activity   Alcohol use: No    Alcohol/week: 0.0 standard drinks of alcohol   Drug use: No   Sexual activity: Not on file  Other Topics Concern   Not on file  Social History Narrative   Originally from [place], outside of [place].    Moved here permanently 2012.   Intermittently worked on a farm.   Lives with many cats--3 plus fosters cats.    Single, lives alone in a one story home. Rarely drinks caffeine. 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   Social Determinants of Health   Financial Resource Strain: Medium Risk (12/06/2020)   Overall Financial Resource Strain (CARDIA)    Difficulty of Paying Living Expenses: Somewhat hard  Food Insecurity: No Food Insecurity (12/06/2020)   Hunger Vital Sign    Worried About Running Out of Food in the Last Year: Never true    Ran Out of Food in the Last Year: Never true  Transportation Needs: No Transportation Needs (12/06/2020)   PRAPARE - Hydrologist (Medical): No    Lack of Transportation (Non-Medical): No  Physical Activity: Inactive (12/06/2020)   Exercise Vital Sign    Days of Exercise per Week: 0 days    Minutes of Exercise per Session: 0 min  Stress: No Stress Concern Present (12/06/2020)   [place]    Feeling of Stress : Not at all  Social Connections: Moderately Isolated (12/06/2020)   Social Connection and Isolation Panel [NHANES]    Frequency of Communication with Friends and Family: More than three times a week    Frequency of Social Gatherings with Friends and Family: More than three times a week    Attends Religious Services: Never    Marine scientist or Organizations: Yes     Attends Music therapist: More than 4 times per year    Marital Status: Never married    Allergies:  Allergies  Allergen Reactions   Metaxalone Hives    Metabolic Disorder Labs: Lab Results  Component Value Date   HGBA1C 5.4 12/09/2022   MPG 103 04/14/2018   No results found for: "PROLACTIN" Lab Results  Component Value Date   CHOL 100 09/10/2022   TRIG 103 09/10/2022   HDL 40 09/10/2022   CHOLHDL 2.5 09/10/2022   LDLCALC 41 09/10/2022   LDLCALC 89 01/20/2022   Lab Results  Component Value Date   TSH 0.866 11/21/2020   TSH  1.616 01/27/2018    Therapeutic Level Labs: No results found for: "LITHIUM" No results found for: "VALPROATE" No results found for: "CBMZ"  Current Medications: Current Outpatient Medications  Medication Sig Dispense Refill   albuterol (VENTOLIN HFA) 108 (90 Base) MCG/ACT inhaler Take 2 puffs 15 minutes before exercise and as needed for shortness of breath. 6.7 g 1   ARIPiprazole (ABILIFY) 15 MG tablet Take 1 tablet (15 mg total) by mouth daily. 30 tablet 3   atorvastatin (LIPITOR) 20 MG tablet Take 1 tablet (20 mg total) by mouth daily. 90 tablet 3   Azelastine HCl 137 MCG/SPRAY SOLN USE 2 SPRAYS ALERNATE NOSTRILS TWICE DAILY 30 mL 2   baclofen (LIORESAL) 10 MG tablet Take 1 tablet (10 mg total) by mouth 2 (two) times daily as needed for muscle spasms. 180 each 1   benztropine (COGENTIN) 0.5 MG tablet Take 1 tablet (0.5 mg total) by mouth daily. 30 tablet 3   blood glucose meter kit and supplies KIT Dispense based on patient and insurance preference. Use up to four times daily as directed. 1 each 0   buPROPion (WELLBUTRIN XL) 300 MG 24 hr tablet Take 1 tablet (300 mg total) by mouth daily. 30 tablet 3   cholecalciferol (VITAMIN D3) 25 MCG (1000 UT) tablet Take 1,000 Units by mouth daily.     diclofenac (VOLTAREN) 75 MG EC tablet Take 1 tablet (75 mg total) by mouth 2 (two) times daily. 60 tablet 0   DULoxetine HCl 30 MG CSDR Take 90  mg by mouth daily.     fluticasone (FLONASE) 50 MCG/ACT nasal spray USE 2 SPRAYS IN EACH NOSTRIL EVERY DAY 16 g 1   HYDROcodone-acetaminophen (NORCO/VICODIN) 5-325 MG tablet Take 1 tablet by mouth 2 (two) times daily as needed for moderate pain (back pain). 60 tablet 0   hydroxychloroquine (PLAQUENIL) 200 MG tablet Take 200 mg by mouth 2 (two) times daily. '200mg'$  twice daily on weekdays and '200mg'$  once daily on weekends as of 12/03/22 review     levocetirizine (XYZAL) 5 MG tablet TAKE 1 TABLET (5 MG TOTAL) BY MOUTH EVERY EVENING. 90 tablet 1   metFORMIN (GLUCOPHAGE) 1000 MG tablet TAKE 1 Tablet  BY MOUTH TWICE DAILY WITH A MEAL 180 tablet 1   montelukast (SINGULAIR) 10 MG tablet TAKE 1 Tablet BY MOUTH ONCE EVERY NIGHT AT BEDTIME 90 tablet 1   predniSONE (DELTASONE) 50 MG tablet One tab PO daily for 5 days. 5 tablet 0   pregabalin (LYRICA) 150 MG capsule Take 1 capsule (150 mg total) by mouth 2 (two) times daily. 60 capsule 3   Semaglutide,0.25 or 0.'5MG'$ /DOS, (OZEMPIC, 0.25 OR 0.5 MG/DOSE,) 2 MG/1.5ML SOPN Inject 0.5 mg into the skin once a week. 4.5 mL 0   No current facility-administered medications for this visit.     Musculoskeletal: Strength & Muscle Tone: within normal limits and  telehealth visit Gait & Station: normal, telehealth visit Patient leans: N/A  Psychiatric Specialty Exam: Review of Systems  There were no vitals taken for this visit.There is no height or weight on file to calculate BMI.  General Appearance: Well Groomed  Eye Contact:  Good  Speech:  Clear and Coherent and Normal Rate  Volume:  Normal  Mood:   Mild depression  Affect:  Congruent  Thought Process:  Coherent, Goal Directed and Linear  Orientation:  Full (Time, Place, and Person)  Thought Content: WDL and Logical   Suicidal Thoughts:  No  Homicidal Thoughts:  No  Memory:  Immediate;   Good Recent;   Good Remote;   Good  Judgement:  Good  Insight:  Good  Psychomotor Activity:  Normal  Concentration:   Concentration: Fair and Attention Span: Fair  Recall:  Good  Fund of Knowledge: Good  Language: Good  Akathisia:  No  Handed:  Right  AIMS (if indicated): Not done  Assets:  Communication Skills Desire for Improvement Financial Resources/Insurance Housing Social Support  ADL's:  Intact  Cognition: WNL  Sleep:  Good   Screenings: AIMS    Flowsheet Row Admission (Discharged) from 01/25/2018 in [facility]  AIMS Total Score 0      AUDIT    Flowsheet Row Admission (Discharged) from 01/25/2018 in [facility]  Alcohol Use Disorder Identification Test Final Score (AUDIT) 0      GAD-7    Flowsheet Row Video Visit from 01/26/2023 in [facility] Office Visit from 12/09/2022 in [place] [facility] Video Visit from 11/25/2022 in [facility] Counselor from 10/23/2022 in [facility] Office Visit from 09/25/2022 in [place] at [facility]  Total GAD-7 Score '2 2 4 2 1      '$ PHQ2-9    Flowsheet Row Video Visit from 01/26/2023 in [facility] Office Visit from 12/09/2022 in [place] at [facility] Video Visit from 11/25/2022 in [facility] Counselor from 10/23/2022 in [facility] Office Visit from 09/25/2022 in [place] at [facility]  PHQ-2 Total Score '2 2 3 2 2  '$ PHQ-9 Total Score '6 8 11 6 6      '$ [place] ED from 11/08/2022 in [facility] Emergency Department at [facility] ED from 10/19/2022 in [facility] ED from 07/20/2022 in [facility] [place] No Risk No Risk Error: Question 6 not populated         Assessment and Plan: Patient notes that she is doing well on her current medication regimen.  No medication changes made today.  Patient agreeable to take medication as prescribed.  1. Bipolar I disorder (HCC)  Continue- ARIPiprazole (ABILIFY) 15 MG tablet; Take 1 tablet (15 mg total) by mouth daily.  Dispense: 30 tablet; Refill: 3 Continue- benztropine (COGENTIN) 0.5 MG tablet; Take 1 tablet (0.5 mg total) by mouth daily.  Dispense: 30 tablet; Refill: 3 Continue- buPROPion (WELLBUTRIN XL) 300 MG 24 hr tablet; Take 1 tablet (300 mg total) by mouth daily.  Dispense: 30 tablet; Refill: 3  2. Attention deficit hyperactivity disorder (ADHD), predominantly inattentive type  Continue- buPROPion (WELLBUTRIN XL) 300 MG 24 hr tablet; Take 1 tablet (300 mg total) by mouth daily.  Dispense: 30 tablet; Refill: 3   Follow up in 3 moths Follow up with therapy   Salley Slaughter, NP 01/26/2023, 11:48 AM

## 2023-01-27 ENCOUNTER — Other Ambulatory Visit: Payer: Self-pay

## 2023-01-27 ENCOUNTER — Telehealth: Payer: Self-pay

## 2023-01-27 NOTE — Progress Notes (Signed)
Erroneous error

## 2023-01-29 ENCOUNTER — Other Ambulatory Visit: Payer: Medicaid Other | Admitting: Pharmacist

## 2023-01-29 NOTE — Progress Notes (Signed)
01/29/2023 Name: Frances [name] MRN: [id number] DOB: 07-Dec-1976   Frances [name] is a 47 y.o. year old female who presented for a telephone visit.   They were referred to the pharmacist by their PCP for assistance in managing diabetes and hyperlipidemia.   Patient is participating in a Managed Medicaid Plan:  Yes  Subjective:  Care Team: Primary Care Provider: Lavada Mesi ; Next Scheduled Visit: 03/10/23  Medication Access/Adherence  Current Pharmacy:  [facility] [address], [place] [address] Phone: [phone] Fax: [phone]  [facility], [address], [place], [place] [address] Phone: [phone] Fax: [phone]  [place], [address] [place] [address] Phone: [phone] Fax: [phone]   Patient reports affordability concerns with their medications: No  Patient reports access/transportation concerns to their pharmacy: No  Patient reports adherence concerns with their medications:  No     Diabetes:  Current medications: metformin 1034m BID, ozempic 0.547mweekly   Current glucose readings: not currently checking, A1c at goal  Patient denies hypoglycemic s/sx including dizziness, shakiness, sweating. Patient denies hyperglycemic symptoms including polyuria, polydipsia, polyphagia, nocturia, neuropathy, blurred vision.   Current medication access support: ozempic 0.65m17meekly via novonordisk    Hyperlipidemia/ASCVD Risk Reduction  Current lipid lowering medications: atorvastatin 55m57mily, LDL 41    Objective:  Lab Results  Component Value Date   HGBA1C 5.4 12/09/2022    Lab Results  Component Value Date   CREATININE 0.89 09/10/2022   BUN 13 09/10/2022   NA 138 09/10/2022   K 4.7  09/10/2022   CL 99 09/10/2022   CO2 20 09/10/2022    Lab Results  Component Value Date   CHOL 100 09/10/2022   HDL 40 09/10/2022   LDLCALC 41 09/10/2022   TRIG 103 09/10/2022   CHOLHDL 2.5 09/10/2022    Medications Reviewed Today     Reviewed by KlinDarius BumpH (Pharmacist) on 01/29/23 at 0911[place] Status: <None>   Medication Order Taking? Sig Documenting Provider Last Dose Status Informant  albuterol (VENTOLIN HFA) 108 (90 Base) MCG/ACT inhaler [id number] 2 puffs 15 minutes before exercise and as needed for shortness of breath.  Patient not taking: Reported on 01/29/2023   BreeDonella Stade-C Not Taking Active Self  ARIPiprazole (ABILIFY) 15 MG tablet [id number] Take 1 tablet (15 mg total) by mouth daily. ParsSalley Slaughter Taking Active   atorvastatin (LIPITOR) 20 MG tablet [id number] Take 1 tablet (20 mg total) by mouth daily. BreeDonella Stade-C Taking Active   Azelastine HCl 137 MCG/SPRAY SOLN [id number] USE 2 SPRAYS ALERNATE NOSTRILS TWICE DAILY Breeback, Jade L, PA-C Taking Active   baclofen (LIORESAL) 10 MG tablet [id number] Take 1 tablet (10 mg total) by mouth 2 (two) times daily as needed for muscle spasms. BreeDonella Stade-C Taking Active   benztropine (COGENTIN) 0.5 MG tablet [id number] Take 1 tablet (0.5 mg total) by mouth daily. ParsSalley Slaughter Taking Active   blood glucose meter kit and supplies KIT [id number] Dispense based on patient and insurance preference. Use up to four times daily as directed. BreeDonella Stade-C Taking Active   buPROPion (WELLBUTRIN XL) 300 MG 24 hr tablet [id number] Take 1  tablet (300 mg total) by mouth daily. Salley Slaughter, NP Taking Active   cholecalciferol (VITAMIN D3) 25 MCG (1000 UT) tablet [id number] Yes Take 1,000 Units by mouth daily. [provider] Taking Active   diclofenac (VOLTAREN) 75 MG EC tablet [id number] Yes Take 1 tablet (75 mg total) by mouth 2 (two)  times daily. Raylene Everts, MD Taking Active   DULoxetine HCl 30 MG CSDR [id number] Yes Take 90 mg by mouth daily. [provider] Taking Active   fluticasone (FLONASE) 50 MCG/ACT nasal spray [id number] Yes USE 2 SPRAYS IN EACH NOSTRIL EVERY DAY Breeback, Jade L, PA-C Taking Active   HYDROcodone-acetaminophen (NORCO/VICODIN) 5-325 MG tablet [id number] Yes Take 1 tablet by mouth 2 (two) times daily as needed for moderate pain (back pain). Donella Stade, PA-C Taking Active   hydroxychloroquine (PLAQUENIL) 200 MG tablet [id number] Yes Take 200 mg by mouth 2 (two) times daily. 251m twice daily on weekdays and 204monce daily on weekends as of 12/03/22 review [provider] Taking Active   levocetirizine (XYZAL) 5 MG tablet [id number] TAKE 1 TABLET (5 MG TOTAL) BY MOUTH EVERY EVENING. BrDonella StadePA-C Taking Active   metFORMIN (GLUCOPHAGE) 1000 MG tablet [id number] TAKE 1 Tablet  BY MOUTH TWICE DAILY WITH A MEAL Breeback, Jade L, PA-C Taking Active   montelukast (SINGULAIR) 10 MG tablet [id number] TAKE 1 Tablet BY MOUTH ONCE EVERY NIGHT AT BEDTIME Breeback, Jade L, PA-C Taking Active   pregabalin (LYRICA) 150 MG capsule [id number] Take 1 capsule (150 mg total) by mouth 2 (two) times daily. ThSilverio DecampMD Taking Active   Semaglutide,0.25 or 0.5MG/DOS, (OZEMPIC, 0.25 OR 0.5 MG/DOSE,) 2 MG/1.5ML SOPN [id number] Inject 0.5 mg into the skin once a week. BrDonella StadePA-C Taking Active   Med List Note (FRosario Jacks3/02/18 1343): Pt uses [place] med assist               Assessment/Plan:   Diabetes: - Currently controlled - Recommend to continue current regimen  - Novonordisk PAP for ozempic is ongoing for 2024. When this runs out, under medicaid, patient will be able to obtain using RX to pharmacy, prior auth, and should be $4 for 90DS. Will support this transition if/when we need it.   Hyperlipidemia/ASCVD Risk Reduction: -  Currently controlled.  - Recommend to continue current regimen    Follow Up Plan: 3 months to assess transition of medication coverage to meLovelandPharmD Clinical Pharmacist [phone]

## 2023-01-29 NOTE — Patient Instructions (Signed)
Frances [name],  Great catching up with you today.  We discussed continuing all medications as currently prescribed, and keeping up the great work with having your A1c and cholesterol well controlled!  I will reach out in about 3 months to see how things are going as your medications transition over to your new medicaid insurance coverage.  Take care, [name], PharmD Clinical Pharmacist [facility] [phone]

## 2023-02-12 ENCOUNTER — Ambulatory Visit
Admit: 2023-02-12 | Discharge: 2023-02-13 | Payer: MEDICAID | Attending: Student in an Organized Health Care Education/Training Program | Primary: Student in an Organized Health Care Education/Training Program

## 2023-02-13 ENCOUNTER — Ambulatory Visit (INDEPENDENT_AMBULATORY_CARE_PROVIDER_SITE_OTHER): Payer: No Payment, Other | Admitting: Clinical

## 2023-02-13 DIAGNOSIS — F319 Bipolar disorder, unspecified: Secondary | ICD-10-CM

## 2023-02-13 NOTE — Progress Notes (Signed)
THERAPIST PROGRESS NOTE Virtual Visit via Video Note  I connected with Frances [name] on 02/13/23 at 10:00 AM EST by a video enabled telemedicine application and verified that I am speaking with the correct person using two identifiers.  Location: Patient: home Provider: office   I discussed the limitations of evaluation and management by telemedicine and the availability of in person appointments. The patient expressed understanding and agreed to proceed.   Follow Up Instructions: I discussed the assessment and treatment plan with the patient. The patient was provided an opportunity to ask questions and all were answered. The patient agreed with the plan and demonstrated an understanding of the instructions.   The patient was advised to call back or seek an in-person evaluation if the symptoms worsen or if the condition fails to improve as anticipated.   Session Time: 20 minutes  Participation Level: Active  Behavioral Response: CasualAlertEuthymic  Type of Therapy: Individual Therapy  Treatment Goals addressed: client will score less than 10 on the PHQ9 questionnaire   ProgressTowards Goals: Progressing  Interventions: CBT and Supportive  Summary:  Frances [name] is a 47 y.o. female who presents for the scheduled appointment oriented times five, appropriately dressed, and friendly. Client denied hallucinations and delusions. Client reported she is doing pretty well on today. Client reported she would current rate her depression at a "3" with 10 being the worst. Client reported she recently had a nerve conduction test and the doctor determined she does not have carpul tunnel. Client reported they will figure out how to improve her hand function. Client reported she is taking Lyrica and Cymbalta which she has had people tell her it can have long term side effects. Client reported she has issue with dizziness and memory and will ask her doctor if there could be a  connection. Client reported otherwise she is working on making changes to her diet to also keep her weight and diabetes. Client reported she needs to cut out sugar as she likes to drink soda most days out the week.  Evidence of progress towards goal:  client PHQ9 score is a 3. Flowsheet Row Counselor from 02/13/2023 in [facility]  PHQ-9 Total Score 3        Suicidal/Homicidal: Nowithout intent/plan  Therapist Response:  Therapist began the appointment asking the client how she has been doing since last seen. Therapist used CBT to engage using active listening and positive emotional support. Therapist used CBT to engage and ask the client about her current severity of depressive symptoms and contributing factors. Therapist completed PHQ questionnaire. Therapist used CBT to reinforce the use of positive coping skills. Therapist used CBT ask the client to identify her progress with frequency of use with coping skills with continued practice in her daily activity.    Therapist assigned the client homework to practice self care and follow up with her medical concerns to her providers.    Plan: Return again in 3 weeks.  Diagnosis: bipolar 1 disorder  Collaboration of Care: Patient refused AEB none requested by the client.e  Patient/Guardian was advised Release of Information must be obtained prior to any record release in order to collaborate their care with an outside provider. Patient/Guardian was advised if they have not already done so to contact the registration department to sign all necessary forms in order for [place] to release information regarding their care.   Consent: Patient/Guardian gives verbal consent for treatment and assignment of benefits for services provided during  this visit. Patient/Guardian expressed understanding and agreed to proceed.   [place], LCSW 02/13/2023

## 2023-02-27 ENCOUNTER — Ambulatory Visit
Admission: RE | Admit: 2023-02-27 | Discharge: 2023-02-27 | Disposition: A | Discharge status: Home / Self Care | Payer: Medicaid Other | Source: Ambulatory Visit | Attending: Physician Assistant | Admitting: Physician Assistant

## 2023-02-27 DIAGNOSIS — N6320 Unspecified lump in the left breast, unspecified quadrant: Secondary | ICD-10-CM

## 2023-02-28 NOTE — Progress Notes (Signed)
Sebaceous cyst. No concerns. Follow up in 1 year.

## 2023-03-06 ENCOUNTER — Ambulatory Visit (INDEPENDENT_AMBULATORY_CARE_PROVIDER_SITE_OTHER): Payer: Medicaid Other | Admitting: Clinical

## 2023-03-06 DIAGNOSIS — F319 Bipolar disorder, unspecified: Secondary | ICD-10-CM | POA: Diagnosis not present

## 2023-03-06 NOTE — Progress Notes (Signed)
   THERAPIST PROGRESS NOTE Virtual Visit via Video Note  I connected with Frances [name] on 03/06/2023 at 10:00 AM EDT by a video enabled telemedicine application and verified that I am speaking with the correct person using two identifiers.  Location: Patient: home Provider: office   I discussed the limitations of evaluation and management by telemedicine and the availability of in person appointments. The patient expressed understanding and agreed to proceed.   Follow Up Instructions: I discussed the assessment and treatment plan with the patient. The patient was provided an opportunity to ask questions and all were answered. The patient agreed with the plan and demonstrated an understanding of the instructions.   The patient was advised to call back or seek an in-person evaluation if the symptoms worsen or if the condition fails to improve as anticipated.    Session Time: 25 minutes  Participation Level: Active  Behavioral Response: CasualAlertEuthymic  Type of Therapy: Individual Therapy  Treatment Goals addressed: client will score less than 10 on the client PHQ9 questionnaire  ProgressTowards Goals: Progressing  Interventions: CBT and Supportive  Summary:  Frances [name] is a 47 y.o. female who presents for the scheduled appointment oriented times five, appropriately dressed, and friendly. Client denied hallucinations and delusions. Client reported she is doing fairly well today. Client reported she spent her birthday yesterday at home and was taken to dinner by one of her friends. Client reported otherwise she has been enjoying the warmer weather as it helps to make her feel better. Client reported she is still foster 3 kittens which is good company for her. Client reported she does not know if she will return to the group therapy sessions through [facility] because they have changed people who run the group and does not like it as much. Client reported no  other complaints at this time. Evidence of progress towards goal:  client GAD7 score is a 2.  Flowsheet Row Counselor from 03/06/2023 in [facility]  PHQ-9 Total Score 2        Suicidal/Homicidal: Nowithout intent/plan  Therapist Response:  Therapist began the appointment asking the client how she has been doing since last seen. Therapist used CBT to engage using active listening and positive emotional support. Therapist used CBT to ask the client about current severity of depressive symptoms. Therapist completed updated SDOH. Therapist used CBT to discuss healthy lifestyle habits. Therapist used CBT ask the client to identify her progress with frequency of use with coping skills with continued practice in her daily activity.    Therapist assigned the client homework to practice self care.    Plan: Return again in 4 weeks.  Diagnosis: bipolar 1 disorder  Collaboration of Care: Patient refused AEB none requested by the client.  Patient/Guardian was advised Release of Information must be obtained prior to any record release in order to collaborate their care with an outside provider. Patient/Guardian was advised if they have not already done so to contact the registration department to sign all necessary forms in order for [place] to release information regarding their care.   Consent: Patient/Guardian gives verbal consent for treatment and assignment of benefits for services provided during this visit. Patient/Guardian expressed understanding and agreed to proceed.   Neena Rhymes Lorre Opdahl, LCSW 03/06/2023

## 2023-03-10 ENCOUNTER — Ambulatory Visit (INDEPENDENT_AMBULATORY_CARE_PROVIDER_SITE_OTHER): Payer: Medicaid Other | Admitting: Physician Assistant

## 2023-03-10 ENCOUNTER — Encounter: Payer: Self-pay | Admitting: Physician Assistant

## 2023-03-10 VITALS — BP 127/83 | HR 95 | Ht 67.0 in | Wt 173.0 lb

## 2023-03-10 DIAGNOSIS — E119 Type 2 diabetes mellitus without complications: Secondary | ICD-10-CM

## 2023-03-10 DIAGNOSIS — M25511 Pain in right shoulder: Secondary | ICD-10-CM | POA: Diagnosis not present

## 2023-03-10 LAB — POCT GLYCOSYLATED HEMOGLOBIN (HGB A1C): Hemoglobin A1C: 5.2 % (ref 4.0–5.6)

## 2023-03-10 MED ORDER — PREDNISONE 50 MG PO TABS: ORAL_TABLET | ORAL | 0 refills | Status: DC

## 2023-03-10 MED ORDER — KETOROLAC TROMETHAMINE 60 MG/2ML IM SOLN
60.0000 mg | Freq: Once | INTRAMUSCULAR | Status: AC
  Administered 2023-03-10: 60 mg via INTRAMUSCULAR

## 2023-03-10 NOTE — Patient Instructions (Signed)
Increase lyrica 2 tablets in morning and then 1 tablet in evening.  Prednisone burst Start exercises Follow up with Dr. Darene Lamer.

## 2023-03-10 NOTE — Progress Notes (Signed)
Established Patient Office Visit  Subjective   Patient ID: Frances [name], female    DOB: 10/06/1976  Age: 47 y.o. MRN: [id number]  Chief Complaint  Patient presents with   Follow-up   Diabetes    HPI Pt is a 47 yo female with T2DM, Bipolar 1, chronic pain due to OA/RA/fibromyalgia who presents to the clinic for 3 month follow up.   She is taking her ozempic and metformin. She is not checking her morning sugars. She denies any hypoglycemic event. No open sores or wounds. She is active but does not exercise. She is in a lot of chronic pain. No injury to her right shoulder but it has really been bothering her. Hard to lift it. No numbness or tingling. No neck pain.   Mood is good. No concerns.   .. 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   Lumbar spondylosis 02/10/2008   Back pain 10/13/2008   FIBROMYALGIA 02/10/2008   IRON, SERUM, ELEVATED 02/10/2008   Bipolar I disorder ([place]) 09/16/2017   Chronic fatigue syndrome 09/16/2017   MTHFR gene mutation 09/20/2017   New daily persistent headache 09/26/2017   Seronegative rheumatoid arthritis ([place]) 12/09/2017   Borderline personality disorder ([place]) 01/22/2018   Elevated fasting glucose 04/08/2018   Grief reaction 11/28/2019   Labral tear of hip, degenerative, right 01/11/2020   DDD (degenerative disc disease), cervical 03/13/2020   Ingrown toenail of both feet 03/13/2020   Recurrent major depressive disorder, in partial remission ([place]) 05/24/2020   Generalized anxiety disorder 05/24/2020   Symptomatic mammary hypertrophy 09/04/2020   Bilateral leg edema 11/19/2020   Seasonal allergies 12/25/2020   Type 2 diabetes mellitus without complication, without long-term current use of insulin ([place]) 03/13/2021   Hyperlipidemia LDL goal <70 03/18/2021   Attention deficit hyperactivity disorder (ADHD), 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   Overweight (BMI 25.0-29.9) 06/18/2021    SOB (shortness of breath) on exertion 08/20/2021   ETD (Eustachian tube dysfunction), bilateral 08/23/2021   Chronic right hip pain 01/06/2022   Acute pain of right shoulder 01/06/2022   Proteinuria 03/17/2022   DEGENERATIVE DISC DISEASE, LUMBAR SPINE 04/07/2022   Night sweats 06/18/2022   Microalbuminuria 09/09/2022   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   Resolved Ambulatory Problems    Diagnosis Date Noted   FEVER, RECURRENT 04/02/2009   Hypothyroidism 02/10/2008   SINUSITIS- ACUTE-NOS 03/24/2008   URI 01/17/2009   DERMATITIS, ALLERGIC 06/05/2008   ANKLE PAIN, BILATERAL 07/28/2008   FATIGUE 05/05/2008   OPEN WOUND FT NO TOE ALONE WITHOUT MENTION COMP 03/28/2009   DYSPNEA 10/15/2011   Fibromyalgia 09/16/2017   TMJ (temporomandibular joint syndrome) 09/26/2017   Anxiety 01/22/2018   Depression    Bipolar 1 disorder, depressed, severe ([place]) 01/25/2018   Decreased visual acuity 08/30/2018   Right corneal abrasion 08/30/2018   Toenail fungus 09/09/2019   Vasovagal response 09/09/2019   Chronic right-sided low back pain without sciatica 01/11/2020   Macromastia 06/29/2020   Xiphoid pain 08/13/2020   Sternum pain 08/13/2020   Class 1 obesity due to excess calories without serious comorbidity with body mass index (BMI) of 32.0 to 32.9 in adult 08/13/2020   Costochondritis 10/22/2020   Rib pain on right side 10/22/2020   Tachycardia 10/22/2020   Hepatomegaly 10/22/2020   Chest congestion 11/20/2020   Sinus drainage 11/20/2020   Ear popping, bilateral 08/20/2021   Right lateral epicondylitis 12/31/2021   Weak urine stream 03/17/2022   Acute left-sided low  back pain without sciatica 04/07/2022   Acute midline low back pain without sciatica 04/07/2022   Irritant contact dermatitis due to plants, except food 06/18/2022   Ingrown toenail of right foot 09/09/2022   Chronic bilateral low back pain without sciatica 09/25/2022   Past Medical History:  Diagnosis Date   Arthritis     Back pain, chronic    Bipolar 1 disorder (HCC)    DDD (degenerative disc disease), lumbar    Diabetes mellitus without complication ([place])    IUD 2012     ROS Right shoulder pain with abduction and ROM.  Tenderness to palpation over lateral deltoid.  Strength 5/5 right shoulder.    Objective:     BP 127/83   Pulse 95   Ht 5\' 7"  (1.702 m)   Wt 173 lb (78.5 kg)   SpO2 96%   BMI 27.10 kg/m  BP Readings from Last 3 Encounters:  03/10/23 127/83  12/09/22 125/78  11/27/22 117/81   Wt Readings from Last 3 Encounters:  03/10/23 173 lb (78.5 kg)  12/09/22 166 lb 0.6 oz (75.3 kg)  11/27/22 161 lb (73 kg)      Physical Exam Constitutional:      Appearance: Normal appearance.  HENT:     Head: Normocephalic.  Cardiovascular:     Rate and Rhythm: Normal rate and regular rhythm.  Pulmonary:     Effort: Pulmonary effort is normal.     Breath sounds: Normal breath sounds.  Musculoskeletal:     Cervical back: Normal range of motion and neck supple.     Right lower leg: No edema.     Left lower leg: No edema.     Comments: Right shoulder: pain with abduction. Tenderness over deltoid to palpation.  5/5 strength.   Neurological:     General: No focal deficit present.     Mental Status: She is alert and oriented to person, place, and time.  Psychiatric:        Mood and Affect: Mood normal.       Results for orders placed or performed in visit on 03/10/23  POCT glycosylated hemoglobin (Hb A1C)  Result Value Ref Range   Hemoglobin A1C 5.2 4.0 - 5.6 %   HbA1c POC (<> result, manual entry)     HbA1c, POC (prediabetic range)     HbA1c, POC (controlled diabetic range)         Assessment & Plan:  Marland KitchenMarland KitchenMerilyn was seen today for follow-up and diabetes.  Diagnoses and all orders for this visit:  Type 2 diabetes mellitus without complication, without long-term current use of insulin (HCC) -     POCT glycosylated hemoglobin (Hb A1C)  Acute pain of right shoulder -      predniSONE (DELTASONE) 50 MG tablet; One tab PO daily for 5 days. -     ketorolac (TORADOL) injection 60 mg  Other orders -     Discontinue: predniSONE (DELTASONE) 50 MG tablet; One tab PO daily for 5 days.  A1C to goal Continue on same medications BP to goal.  On statin.  Eye and foot exam UTD.  Flu/pneumonia/covid UTD Follow up in 3 months  Likely bursitis of right shoulder No red flags Has appt with Dr. Darene Lamer, sports medicine coming up Toradol in office Prednisone burst given HO with exercises to start Ice regularly Follow up as needed  For chronic pain increase lyrica to 2 tablets in the morning and then 1 tablet in evening to see if helping.  Iran Planas, PA-C

## 2023-03-13 ENCOUNTER — Other Ambulatory Visit: Payer: Self-pay | Admitting: Physician Assistant

## 2023-03-13 DIAGNOSIS — M5136 Other intervertebral disc degeneration, lumbar region: Secondary | ICD-10-CM

## 2023-03-13 DIAGNOSIS — M0609 Rheumatoid arthritis without rheumatoid factor, multiple sites: Secondary | ICD-10-CM

## 2023-03-13 DIAGNOSIS — M51369 Other intervertebral disc degeneration, lumbar region without mention of lumbar back pain or lower extremity pain: Secondary | ICD-10-CM

## 2023-03-13 DIAGNOSIS — G8929 Other chronic pain: Secondary | ICD-10-CM

## 2023-03-16 ENCOUNTER — Encounter: Payer: Self-pay | Admitting: Physician Assistant

## 2023-03-16 MED ORDER — HYDROCODONE-ACETAMINOPHEN 5-325 MG PO TABS: 1.0000 | ORAL_TABLET | Freq: Two times a day (BID) | ORAL | 0 refills | Status: DC | PRN

## 2023-03-16 NOTE — Telephone Encounter (Signed)
PDMP reviewed during this encounter. 

## 2023-03-18 ENCOUNTER — Ambulatory Visit (INDEPENDENT_AMBULATORY_CARE_PROVIDER_SITE_OTHER): Payer: Medicaid Other | Admitting: Sports Medicine

## 2023-03-18 DIAGNOSIS — S4491XD Injury of unspecified nerve at shoulder and upper arm level, right arm, subsequent encounter: Secondary | ICD-10-CM

## 2023-03-18 DIAGNOSIS — M47816 Spondylosis without myelopathy or radiculopathy, lumbar region: Secondary | ICD-10-CM | POA: Diagnosis not present

## 2023-03-18 DIAGNOSIS — M25511 Pain in right shoulder: Secondary | ICD-10-CM | POA: Diagnosis not present

## 2023-03-18 MED ORDER — PREGABALIN 200 MG PO CAPS: ORAL_CAPSULE | ORAL | 3 refills | Status: DC

## 2023-03-18 NOTE — Assessment & Plan Note (Addendum)
Bilateral upper extremity nerve conduction and EMG were negative, symptoms are likely myofascial, continue Lyrica up taper. At this point I do not have a good explanation as to why she is having numbness and tingling in her hand.

## 2023-03-18 NOTE — Assessment & Plan Note (Signed)
Frances [name] has multilevel lumbar spinal stenosis moderate at the L3-L4 level, she has had multiple interventions including epidurals, facet injections, medial branch blocks, RFA's, she has not responded to multiple medications, including steroids, narcotics, PT has not provided sufficient relief, spine surgery did suggest nonoperative intervention, Lyrica was recently bumped up to 150 twice a day, she has noted some improvement so we will go up again, 200 mg 2-3 times daily. I can follow this up in about 6 weeks.

## 2023-03-18 NOTE — Assessment & Plan Note (Signed)
Frances [name] returns, she has had several visits since last year for shoulder pain, she localizes the shoulder pain anteriorly, worse with abduction and forward flexion. On exam she has good passive motion, she has minimally positive impingement signs, she does have a fairly strongly positive speeds test. This is suspicious for bicipital tendinopathy. She has had x-rays that were unrevealing, as treatment has gone on for several months we will proceed with MRI, I like to see her back for MRI results we will consider bicipital versus subacromial injection if not better.  For insurance coverage purposes she has had visits on 12/18/2022, 01/16/2023, 03/10/2023 and 03/18/2023 for her shoulder. She has done [place] exercises, rotator cuff conditioning 3 times a day every other day since her initial visit on 12/18/2022.  She did have x-rays as well that were unrevealing. She has tried multiple medications including Lyrica.

## 2023-03-18 NOTE — Progress Notes (Signed)
    Procedures performed today:    None.  Independent interpretation of notes and tests performed by another provider:   None.  Brief History, Exam, Impression, and Recommendations:    Acute pain of right shoulder Frances [name] returns, she has had several visits since last year for shoulder pain, she localizes the shoulder pain anteriorly, worse with abduction and forward flexion. On exam she has good passive motion, she has minimally positive impingement signs, she does have a fairly strongly positive speeds test. This is suspicious for bicipital tendinopathy. She has had x-rays that were unrevealing, as treatment has gone on for several months we will proceed with MRI, I like to see her back for MRI results we will consider bicipital versus subacromial injection if not better.  For insurance coverage purposes she has had visits on 12/18/2022, 01/16/2023, 03/10/2023 and 03/18/2023 for her shoulder. She has done [place] exercises, rotator cuff conditioning 3 times a day every other day since her initial visit on 12/18/2022.  She did have x-rays as well that were unrevealing. She has tried multiple medications including Lyrica.   Lumbar spondylosis Frances [name] has multilevel lumbar spinal stenosis moderate at the L3-L4 level, she has had multiple interventions including epidurals, facet injections, medial branch blocks, RFA's, she has not responded to multiple medications, including steroids, narcotics, PT has not provided sufficient relief, spine surgery did suggest nonoperative intervention, Lyrica was recently bumped up to 150 twice a day, she has noted some improvement so we will go up again, 200 mg 2-3 times daily. I can follow this up in about 6 weeks.  Neuropraxia of right thumb Bilateral upper extremity nerve conduction and EMG were negative, symptoms are likely myofascial, continue Lyrica up taper. At this point I do not have a good explanation as to why she is having numbness and tingling in  her hand.    ____________________________________________ Frances [name], M.D., ABFM., CAQSM., AME. Primary Care and Sports Medicine [facility]  Adjunct Professor of [place] [employer]  Risk manager

## 2023-03-19 ENCOUNTER — Ambulatory Visit
Admit: 2023-03-19 | Discharge: 2023-03-20 | Payer: MEDICAID | Attending: Student in an Organized Health Care Education/Training Program | Primary: Student in an Organized Health Care Education/Training Program

## 2023-03-20 MED ORDER — TOCILIZUMAB 162 MG/0.9 ML SUBCUTANEOUS PEN INJECTOR
SUBCUTANEOUS | 2 refills | 0 days | Status: CP
Start: 2023-03-20 — End: ?
  Filled 2023-03-30: qty 3.6, 28d supply, fill #0

## 2023-03-21 ENCOUNTER — Ambulatory Visit (INDEPENDENT_AMBULATORY_CARE_PROVIDER_SITE_OTHER): Payer: Medicaid Other

## 2023-03-21 DIAGNOSIS — M25511 Pain in right shoulder: Secondary | ICD-10-CM | POA: Diagnosis not present

## 2023-03-23 DIAGNOSIS — M0600 Rheumatoid arthritis without rheumatoid factor, unspecified site: Principal | ICD-10-CM

## 2023-03-25 ENCOUNTER — Encounter: Payer: Self-pay | Admitting: Physician Assistant

## 2023-03-25 NOTE — Unmapped (Signed)
[facility] Specialty Medication Onboarding    Specialty Medication: ACTEMRA ACTPEN 162 mg/0.9 mL Pnij (tocilizumab)  Prior Authorization: Approved   Financial Assistance: No - copay  <$25  Final Copay/Day Supply: $4 / 28 days    Insurance Restrictions: None     Notes to Pharmacist:     The triage team has completed the benefits investigation and has determined that the patient is able to fill this medication at [facility]. Please contact the patient to complete the onboarding or follow up with the prescribing physician as needed.

## 2023-03-25 NOTE — Unmapped (Signed)
[facility]   Patient Onboarding/Medication Counseling    Alice Howell is a 47 y.o. female with RA who I am counseling today on initiation of therapy.  I am speaking to the patient.    Was a Nurse, learning disability used for this call? No    Verified patient's date of birth / HIPAA.    Specialty medication(s) to be sent: Inflammatory Disorders: Actemra      Non-specialty medications/supplies to be sent: sharps container      Medications not needed at this time: n/a         Actemra?? (tocilizumab)    Medication & Administration     Dosage: Rheumatoid arthritis (100kg or more): Inject 162mg  under the skin one time weekly    Lab tests required prior to treatment initiation:  Tuberculosis: Tuberculosis screening resulted in a non-reactive Quantiferon TB Gold assay.  Absolute neutrophil count: ANC greater than 2,000/mm3.  Platelet count: Platelets are greater than 100,000/mm3.  Liver function tests: ALT and AST not documented in the patient's chart but medication prescriber has indicated they are aware and wishing to initiate treatment at this time.      Administration:     Systems developer all supplies needed for injection on a clean, flat working surface: medication pen removed from packaging, alcohol swab, sharps container, etc.  Look at the medication label - look for correct medication, correct dose, and check the expiration date  Look at the medication - the liquid visible in the window on the side of the pen device should appear clear and colorless to pale yellow  Lay the auto-injector pen on a flat surface and allow it to warm up to room temperature for at least 45 minutes  Select injection site - you can use the front of your thigh or your belly (but not the area 2 inches around your belly button); if someone else is giving you the injection you can also use your upper arm in the skin covering your triceps muscle  Prepare injection site - wash your hands and clean the skin at the injection site with an alcohol swab and let it air dry, do not touch the injection site again before the injection  Twist and pull off the green safety cap, do not remove until immediately prior to injection and do not touch the needle shield  Pinch the skin - with your hand not holding the auto-injector pinch up a fold of skin at the injection site using your forefinger and thumb to prepare a firm injection site  Put the needle shield against your skin at the injection site at a 90 degree angle, hold the pen such that you can see the clear medication window  To initiate the injection unlock the green activation button by pressing the needle shield completely in against the injection site, then depress the green activation button to start the injection - you will hear a click sound  Continue to press the green activation button and hold the pen firmly against your skin for about 10 seconds - the window will start to turn solid purple  There will be a second click sound when the injection is complete, verify the window is solid purple before pulling the pen away from your skin  Dispose of the used auto-injector pen immediately in your sharps disposal container the needle will be covered automatically  If you see any blood at the injection site, press a cotton ball or gauze on the site and  maintain pressure until the bleeding stops, do not rub the injection site      Adherence/Missed dose instructions:  If your injection is given more than 2 days after your scheduled injection date - consult your pharmacist for additional instructions on how to adjust your dosing schedule.      Goals of Therapy     Rheumatoid arthritis  Achieve low disease activity or remission  Slow disease progression  Protection of remaining articular structures  Maintenance of function  Maintenance of effective psychosocial functioning      Side Effects & Monitoring Parameters     Injection site reaction (redness, irritation, inflammation localized to the site of administration)  Signs of a common cold - minor sore throat, runny or stuffy nose, etc.  Headache    The following side effects should be reported to the provider:  Signs of a hypersensitivity reaction - rash; hives; itching; red, swollen, blistered, or peeling skin; wheezing; tightness in the chest or throat; difficulty breathing, swallowing, or talking; swelling of the mouth, face, lips, tongue, or throat; etc.  Reduced immune function - report signs of infection such as fever; chills; body aches; very bad sore throat; ear or sinus pain; cough; more sputum or change in color of sputum; pain with passing urine; wound that will not heal, etc.  Also at a slightly higher risk of some malignancies (mainly skin and blood cancers) due to this reduced immune function.  In the case of signs of infection - the patient should hold the next dose of Actemra?? and call your primary care provider to ensure adequate medical care.  Treatment may be resumed when infection is treated and patient is asymptomatic.  Signs of unexplained bruising or bleeding - throwing up blood or emesis that looks like coffee grounds; black, tarry, or bloody stool; etc.  Changes in skin - a new growth or lump that forms; changes in shape, size, or color of a previous mole or marking  Shortness of breath  Chest pain/pressure      Warnings, Precautions, & Contraindications     Have your bloodwork checked as you have been told by your prescriber (ANC, platelets, lipids, etc.)  Patients with concomitant IBD or history of diverticulitis are at an increased risk for GI perforations.  Birth control pills and other hormone-based birth control may not work as well to prevent pregnancy  Talk with your doctor if you are pregnant, planning to become pregnant, or breastfeeding  Discuss the possible need for holding your dose(s) of Actemra?? when a planned procedure is scheduled with the prescriber as it may delay healing/recovery timeline Drug/Food Interactions     Medication list reviewed in Epic. The patient was instructed to inform the care team before taking any new medications or supplements. No drug interactions identified.   Talk with you prescriber or pharmacist before receiving any live vaccinations while taking this medication and after you stop taking it    Storage, Handling Precautions, & Disposal     Store this medication in the refrigerator.  Do not freeze  If needed, you may store at room temperature for up to 14 days  Store in original packaging, protected from light  Do not shake  Dispose of used syringes/pens in a sharps disposal container      Current Medications (including OTC/herbals), Comorbidities and Allergies     Current Outpatient Medications   Medication Sig Dispense Refill    albuterol HFA 90 mcg/actuation inhaler Take 2 puffs 15 minutes before exercise  and as needed for shortness of breath.      ARIPiprazole (ABILIFY) 10 MG tablet Take 1 tablet (10 mg total) by mouth daily.      atorvastatin (LIPITOR) 10 MG tablet Take 1 tablet (10 mg total) by mouth daily.      azelastine (ASTELIN) 137 mcg (0.1 %) nasal spray 1 spray into each nostril two (2) times a day. Use in each nostril as directed      baclofen (LIORESAL) 10 MG tablet Take 1 tablet (10 mg total) by mouth two (2) times a day as needed for muscle spasms. 60 tablet 0    baclofen (LIORESAL) 10 MG tablet Take 1 tablet (10 mg total) by mouth two (2) times a day as needed for muscle spasm. 60 tablet 0    benztropine (COGENTIN) 0.5 MG tablet Take 1 tablet (0.5 mg total) by mouth daily with evening meal.      buPROPion (WELLBUTRIN XL) 300 MG 24 hr tablet Take 1 tablet (300 mg total) by mouth daily. Take 450 mg daily      cholecalciferol, vitamin D3, (VITAMIN D3 ORAL) Take 5,000 Units by mouth daily.       diclofenac (VOLTAREN) 75 MG EC tablet Take 1 tablet (75 mg total) by mouth two (2) times a day. 180 tablet 3    DULoxetine (CYMBALTA) 30 MG capsule Take 3 capsules (90 mg total) by mouth daily. 270 capsule 3    fluticasone propionate (FLONASE) 50 mcg/actuation nasal spray USE 2 SPRAYS IN EACH NOSTRIL EVERY DAY      HYDROcodone-acetaminophen (NORCO) 5-325 mg per tablet Take 1 tablet by mouth every four (4) hours as needed.      hydroxychloroquine (PLAQUENIL) 200 mg tablet Take 1 tablet (200 mg total) by mouth Two (2) times a day. 180 tablet 3    levocetirizine (XYZAL) 5 MG tablet Take 1 tablet (5 mg total) by mouth every evening.      metFORMIN (GLUCOPHAGE) 1000 MG tablet Take 1 tablet (1,000 mg total) by mouth in the morning and 1 tablet (1,000 mg total) in the evening. Take with meals.      montelukast (SINGULAIR) 10 mg tablet Take 1 tablet (10 mg total) by mouth nightly.      pregabalin (LYRICA) 100 MG capsule Take 1 capsule (100 mg total) by mouth two (2) times a day.      semaglutide (OZEMPIC) 0.25 mg or 0.5 mg(2 mg/1.5 mL) PnIj injection Inject 0.25 mg under the skin every seven (7) days.      tocilizumab 162 mg/0.9 mL PnIj Inject the contents of 1 pen (162 mg total) under the skin every seven (7) days. 3.6 mL 2     No current facility-administered medications for this visit.       Allergies   Allergen Reactions    Metaxalone Hives       There is no problem list on file for this patient.      Reviewed and up to date in Epic.    Appropriateness of Therapy     Acute infections noted within Epic:  No active infections  Patient reported infection: None    Is medication and dose appropriate based on diagnosis and infection status? Yes    Prescription has been clinically reviewed: Yes      Baseline Quality of Life Assessment      How many days over the past month did your RA  keep you from your normal activities? For example, brushing your teeth or  getting up in the morning. No limitation but wake up with hands and fingers swollen and sore     Financial Information     Medication Assistance provided: Prior Authorization    Anticipated copay of $4 reviewed with patient. Verified delivery address.    Delivery Information     Scheduled delivery date: 4/9    Expected start date: 4/9    Patient was notified of new phone menu: No    Medication will be delivered via UPS to the prescription address in Epic [place].  This shipment will not require a signature.      Explained the services we provide at [facility] and that each month we would call to set up refills.  Stressed importance of returning phone calls so that we could ensure they receive their medications in time each month.  Informed patient that we should be setting up refills 7-10 days prior to when they will run out of medication.  A pharmacist will reach out to perform a clinical assessment periodically.  Informed patient that a welcome packet, containing information about our pharmacy and other support services, a Notice of Privacy Practices, and a drug information handout will be sent.      The patient or caregiver noted above participated in the development of this care plan and knows that they can request review of or adjustments to the care plan at any time.      Patient or caregiver verbalized understanding of the above information as well as how to contact the pharmacy at [phone] option 4 with any questions/concerns.  The pharmacy is open Monday through Friday 8:30am-4:30pm.  A pharmacist is available 24/7 via pager to answer any clinical questions they may have.    Patient Specific Needs     Does the patient have any physical, cognitive, or cultural barriers? No    Does the patient have adequate living arrangements? (i.e. the ability to store and take their medication appropriately) Yes    Did you identify any home environmental safety or security hazards? No    Patient prefers to have medications discussed with  Patient     Is the patient or caregiver able to read and understand education materials at a high school level or above? Yes    Patient's primary language is  English     Is the patient high risk? No    SOCIAL DETERMINANTS OF HEALTH     At the [facility], we have learned that life circumstances - like trouble affording food, housing, utilities, or transportation can affect the health of many of our patients.   That is why we wanted to ask: are you currently experiencing any life circumstances that are negatively impacting your health and/or quality of life? No    Social Determinants of Health     Financial Resource Strain: Medium Risk (12/06/2020)    Received from [facility]    Overall Financial Resource Strain (CARDIA)     Difficulty of Paying Living Expenses: Somewhat hard   Internet Connectivity: Not on file   Food Insecurity: No Food Insecurity (12/06/2020)    Received from [facility]    Hunger Vital Sign     Worried About Running Out of Food in the Last Year: Never true     Ran Out of Food in the Last Year: Never true   Tobacco Use: Low Risk  (03/19/2023)    Patient History     Smoking Tobacco Use: Never  Smokeless Tobacco Use: Never     Passive Exposure: Not on file   Housing/Utilities: Not on file   Alcohol Use: Not on file   Transportation Needs: No Transportation Needs (12/06/2020)    Received from [facility] - Transportation     Lack of Transportation (Medical): No     Lack of Transportation (Non-Medical): No   Substance Use: Not on file   Health Literacy: Not on file   Physical Activity: Inactive (12/06/2020)    Received from [facility]    Exercise Vital Sign     Days of Exercise per Week: 0 days     Minutes of Exercise per Session: 0 min   Interpersonal Safety: Not on file   Stress: No Stress Concern Present (12/06/2020)    Received from [facility] - Occupational Stress Questionnaire     Feeling of Stress : Not at all   Intimate Partner Violence: Not At Risk (12/06/2020)    Received from [facility]    Humiliation, Afraid, Rape, and Kick questionnaire     Fear of Current or Ex-Partner: No     Emotionally Abused: No Physically Abused: No     Sexually Abused: No   Depression: Not on file   Social Connections: Moderately Isolated (12/06/2020)    Received from [facility]    Social Connection and Isolation Panel [NHANES]     Frequency of Communication with Friends and Family: More than three times a week     Frequency of Social Gatherings with Friends and Family: More than three times a week     Attends Religious Services: Never     Database administrator or Organizations: Yes     Attends Engineer, structural: More than 4 times per year     Marital Status: Never married       Would you be willing to receive help with any of the needs that you have identified today? Not applicable       Julianne Rice, PharmD  [facility] Specialty Pharmacist

## 2023-03-27 MED ORDER — EMPTY CONTAINER
2 refills | 0 days
Start: 2023-03-27 — End: ?

## 2023-03-30 MED FILL — EMPTY CONTAINER: 90 days supply | Qty: 1 | Fill #0

## 2023-03-31 ENCOUNTER — Other Ambulatory Visit (INDEPENDENT_AMBULATORY_CARE_PROVIDER_SITE_OTHER): Payer: Medicaid Other

## 2023-03-31 ENCOUNTER — Encounter: Payer: Self-pay | Admitting: Sports Medicine

## 2023-03-31 ENCOUNTER — Ambulatory Visit (INDEPENDENT_AMBULATORY_CARE_PROVIDER_SITE_OTHER): Payer: Medicaid Other | Admitting: Sports Medicine

## 2023-03-31 DIAGNOSIS — M25511 Pain in right shoulder: Secondary | ICD-10-CM | POA: Diagnosis not present

## 2023-03-31 NOTE — Progress Notes (Signed)
    Procedures performed today:    Procedure: Real-time Ultrasound Guided injection of the right subacromial bursa Device: Samsung HS60  Verbal informed consent obtained.  Time-out conducted.  Noted no overlying erythema, induration, or other signs of local infection.  Skin prepped in a sterile fashion.  Local anesthesia: Topical Ethyl chloride.  With sterile technique and under real time ultrasound guidance: Noted mild bursitis 1 cc Kenalog 40, 1 cc lidocaine,1 cc bupivacaine injected easily Completed without difficulty  Advised to call if fevers/chills, erythema, induration, drainage, or persistent bleeding.  Images permanently stored and available for review in PACS.  Impression: Technically successful ultrasound guided injection.  Procedure: Real-time Ultrasound Guided injection of the right glenohumeral joint Device: Samsung HS60  Verbal informed consent obtained.  Time-out conducted.  Noted no overlying erythema, induration, or other signs of local infection.  Skin prepped in a sterile fashion.  Local anesthesia: Topical Ethyl chloride.  With sterile technique and under real time ultrasound guidance: Arthritic joint noted, 1 cc Kenalog 40, 2 cc lidocaine, 2 cc bupivacaine injected easily Completed without difficulty  Advised to call if fevers/chills, erythema, induration, drainage, or persistent bleeding.  Images permanently stored and available for review in PACS.  Impression: Technically successful ultrasound guided injection.  Independent interpretation of notes and tests performed by another provider:   None.  Brief History, Exam, Impression, and Recommendations:    Acute pain of right shoulder Chronic shoulder pain, MRI with subacromial bursitis and glenohumeral osteoarthritis, we injected both structures today. Return to see me in 6 weeks.    ____________________________________________ Ihor Austin. Benjamin Stain, M.D., ABFM., CAQSM., AME. Primary Care and Sports  Medicine [facility]  Adjunct Professor of Family Medicine  [place] [employer] [facility] [employer]  Restaurant manager, fast food

## 2023-03-31 NOTE — Assessment & Plan Note (Signed)
Chronic shoulder pain, MRI with subacromial bursitis and glenohumeral osteoarthritis, we injected both structures today. Return to see me in 6 weeks.

## 2023-04-01 ENCOUNTER — Other Ambulatory Visit: Payer: Self-pay | Admitting: Physician Assistant

## 2023-04-01 DIAGNOSIS — E1165 Type 2 diabetes mellitus with hyperglycemia: Secondary | ICD-10-CM

## 2023-04-03 ENCOUNTER — Ambulatory Visit (INDEPENDENT_AMBULATORY_CARE_PROVIDER_SITE_OTHER): Payer: Medicaid Other | Admitting: Clinical

## 2023-04-03 DIAGNOSIS — F319 Bipolar disorder, unspecified: Secondary | ICD-10-CM | POA: Diagnosis not present

## 2023-04-03 NOTE — Progress Notes (Signed)
THERAPIST PROGRESS NOTE Virtual Visit via Video Note  I connected with Frances [name] on 04/03/23 at 10:00 AM EDT by a video enabled telemedicine application and verified that I am speaking with the correct person using two identifiers.  Location: Patient: home Provider: office   I discussed the limitations of evaluation and management by telemedicine and the availability of in person appointments. The patient expressed understanding and agreed to proceed.   Follow Up Instructions: I discussed the assessment and treatment plan with the patient. The patient was provided an opportunity to ask questions and all were answered. The patient agreed with the plan and demonstrated an understanding of the instructions.   The patient was advised to call back or seek an in-person evaluation if the symptoms worsen or if the condition fails to improve as anticipated.    Session Time: 20 minutes  Participation Level: Active  Behavioral Response: CasualAlertEuthymic  Type of Therapy: Individual Therapy  Treatment Goals addressed: Client will score less than a 10 on the PHQ-9 questionnaire  ProgressTowards Goals: Progressing  Interventions: CBT and Supportive  Summary:  Frances [name] is a 47 y.o. female who presents with scheduled appointment oriented x 5, appropriately dressed, friendly.  Client denied hallucinations and delusions. Client reported on today she is doing fairly well but has been a bit sad the past few days.  Client reported she had to get an injection in her right shoulder due to pain.  Client reported she has been somewhat limited with her usual range of motion.  Client reported she has bursitis, tendinitis and arthritis in her joints that cause her constant discomfort.  Client reported she went to see rheumatoid specialist and she is being tried on a new medication for her hand to see if she has rheumatoid or osteoarthritis.  Client reported she is having to give  herself an injection once per week.  Client reported her doctor told her that her hands are in worse condition than it should be for a individual her age.  Client reported in the meantime to keep herself occupied she still has her 3 kittens that she has been fostering.  Client reported if her body discomfort is feeling better she will go sit at a horse showing show to get out of the house.  Client reported the summertime otherwise usually tends to be a good time of year for her as the good weather helps to minimize her depressive symptoms. Evidence of progress towards goal:  Client PHQ9 score is below 10. Flowsheet Row Counselor from 04/03/2023 in [facility]  PHQ-9 Total Score 7        Suicidal/Homicidal: Nowithout intent/plan  Therapist Response:  Therapist began the appointment asking client how she has been doing since last seen. Therapist used CBT to engage using active listening and positive emotional support. Therapist used CBT to engage the client and ask her about the progression of her previously mentioned health concerns and how she is coping with them and how it affects her mood. Therapist used CBT to engage the client to discuss some positive activation activities she can do within reason to help keep her mood elevated. Therapist used CBT ask the client to identify her progress with frequency of use with coping skills with continued practice in her daily activity.    Therapist assigned client homework to utilize her to journal her notepad to record thoughts or other topics that she would like to discuss during her therapy sessions.  Plan: Return again in 3 weeks.  Diagnosis: Bipolar 1 disorder  Collaboration of Care: Patient refused AEB none requested by the client.  Patient/Guardian was advised Release of Information must be obtained prior to any record release in order to collaborate their care with an outside provider. Patient/Guardian was  advised if they have not already done so to contact the registration department to sign all necessary forms in order for [place] to release information regarding their care.   Consent: Patient/Guardian gives verbal consent for treatment and assignment of benefits for services provided during this visit. Patient/Guardian expressed understanding and agreed to proceed.   Neena Rhymes Brihany Butch, LCSW 04/03/2023

## 2023-04-10 LAB — HM DIABETES EYE EXAM

## 2023-04-13 NOTE — Unmapped (Signed)
[facility] Clinical Assessment & Refill Coordination Note    Alice Howell, DOB: Mar 27, 1976  Phone: [phone] (home)     All above HIPAA information was verified with patient.     Was a Nurse, learning disability used for this call? No    Specialty Medication(s):   Inflammatory Disorders: Actemra     Current Outpatient Medications   Medication Sig Dispense Refill    albuterol HFA 90 mcg/actuation inhaler Take 2 puffs 15 minutes before exercise and as needed for shortness of breath.      ARIPiprazole (ABILIFY) 10 MG tablet Take 1 tablet (10 mg total) by mouth daily.      atorvastatin (LIPITOR) 10 MG tablet Take 1 tablet (10 mg total) by mouth daily.      azelastine (ASTELIN) 137 mcg (0.1 %) nasal spray 1 spray into each nostril two (2) times a day. Use in each nostril as directed      baclofen (LIORESAL) 10 MG tablet Take 1 tablet (10 mg total) by mouth two (2) times a day as needed for muscle spasms. 60 tablet 0    baclofen (LIORESAL) 10 MG tablet Take 1 tablet (10 mg total) by mouth two (2) times a day as needed for muscle spasm. 60 tablet 0    benztropine (COGENTIN) 0.5 MG tablet Take 1 tablet (0.5 mg total) by mouth daily with evening meal.      buPROPion (WELLBUTRIN XL) 300 MG 24 hr tablet Take 1 tablet (300 mg total) by mouth daily. Take 450 mg daily      cholecalciferol, vitamin D3, (VITAMIN D3 ORAL) Take 5,000 Units by mouth daily.       diclofenac (VOLTAREN) 75 MG EC tablet Take 1 tablet (75 mg total) by mouth two (2) times a day. 180 tablet 3    DULoxetine (CYMBALTA) 30 MG capsule Take 3 capsules (90 mg total) by mouth daily. 270 capsule 3    empty container Misc USE AS DIRECTED 1 each 2    fluticasone propionate (FLONASE) 50 mcg/actuation nasal spray USE 2 SPRAYS IN EACH NOSTRIL EVERY DAY      HYDROcodone-acetaminophen (NORCO) 5-325 mg per tablet Take 1 tablet by mouth every four (4) hours as needed.      hydroxychloroquine (PLAQUENIL) 200 mg tablet Take 1 tablet (200 mg total) by mouth Two (2) times a day. 180 tablet 3    levocetirizine (XYZAL) 5 MG tablet Take 1 tablet (5 mg total) by mouth every evening.      metFORMIN (GLUCOPHAGE) 1000 MG tablet Take 1 tablet (1,000 mg total) by mouth in the morning and 1 tablet (1,000 mg total) in the evening. Take with meals.      montelukast (SINGULAIR) 10 mg tablet Take 1 tablet (10 mg total) by mouth nightly.      pregabalin (LYRICA) 100 MG capsule Take 1 capsule (100 mg total) by mouth two (2) times a day.      semaglutide (OZEMPIC) 0.25 mg or 0.5 mg(2 mg/1.5 mL) PnIj injection Inject 0.25 mg under the skin every seven (7) days.      tocilizumab 162 mg/0.9 mL PnIj Inject the contents of 1 pen (162 mg total) under the skin every seven (7) days. 3.6 mL 2     No current facility-administered medications for this visit.        Changes to medications: [place] reports no changes at this time.    Allergies   Allergen Reactions    Metaxalone Hives  Changes to allergies: No    SPECIALTY MEDICATION ADHERENCE     Actemra 162  mg/0.76ml : 8 days of medicine on hand       Medication Adherence    Patient reported X missed doses in the last month: 0  Specialty Medication: Actemra          Specialty medication(s) dose(s) confirmed: Regimen is correct and unchanged.     Are there any concerns with adherence? No    Adherence counseling provided? Not needed    CLINICAL MANAGEMENT AND INTERVENTION      Clinical Benefit Assessment:    Do you feel the medicine is effective or helping your condition? Yes - her hands hurt less in the morning now    Clinical Benefit counseling provided? Not needed    Adverse Effects Assessment:    Are you experiencing any side effects? No    Are you experiencing difficulty administering your medicine? No    Quality of Life Assessment:    Quality of Life    Rheumatology  1. What impact has your specialty medication had on the reduction of your daily pain level?: Some  2. What impact has your specialty medication had on your ability to complete daily tasks (prepare meals, get dressed, etc...)?: Some  Oncology  Dermatology  Cystic Fibrosis               Have you discussed this with your provider? Not needed    Acute Infection Status:    Acute infections noted within Epic:  No active infections  Patient reported infection: None    Therapy Appropriateness:    Is therapy appropriate and patient progressing towards therapeutic goals? Yes, therapy is appropriate and should be continued    DISEASE/MEDICATION-SPECIFIC INFORMATION      For patients on injectable medications: Patient currently has 2 doses left.  Next injection is scheduled for 4/23.    Chronic Inflammatory Diseases: Have you experienced any flares in the last month? No  Has this been reported to your provider? No    PATIENT SPECIFIC NEEDS     Does the patient have any physical, cognitive, or cultural barriers? No    Is the patient high risk? No    Did the patient require a clinical intervention? No    Does the patient require physician intervention or other additional services (i.e., nutrition, smoking cessation, social work)? No    SOCIAL DETERMINANTS OF HEALTH     At the [facility], we have learned that life circumstances - like trouble affording food, housing, utilities, or transportation can affect the health of many of our patients.   That is why we wanted to ask: are you currently experiencing any life circumstances that are negatively impacting your health and/or quality of life? Patient declined to answer    Social Determinants of Health     Financial Resource Strain: Medium Risk (12/06/2020)    Received from [facility]    Overall Financial Resource Strain (CARDIA)     Difficulty of Paying Living Expenses: Somewhat hard   Internet Connectivity: Not on file   Food Insecurity: No Food Insecurity (12/06/2020)    Received from [facility]    Hunger Vital Sign     Worried About Running Out of Food in the Last Year: Never true     Ran Out of Food in the Last Year: Never true Tobacco Use: Low Risk  (03/19/2023)    Patient History     Smoking Tobacco Use:  Never     Smokeless Tobacco Use: Never     Passive Exposure: Not on file   Housing/Utilities: Not on file   Alcohol Use: Not on file   Transportation Needs: No Transportation Needs (12/06/2020)    Received from [facility] - Transportation     Lack of Transportation (Medical): No     Lack of Transportation (Non-Medical): No   Substance Use: Not on file   Health Literacy: Not on file   Physical Activity: Inactive (12/06/2020)    Received from [facility]    Exercise Vital Sign     Days of Exercise per Week: 0 days     Minutes of Exercise per Session: 0 min   Interpersonal Safety: Not on file   Stress: No Stress Concern Present (12/06/2020)    Received from [facility] - Occupational Stress Questionnaire     Feeling of Stress : Not at all   Intimate Partner Violence: Not At Risk (12/06/2020)    Received from [facility]    Humiliation, Afraid, Rape, and Kick questionnaire     Fear of Current or Ex-Partner: No     Emotionally Abused: No     Physically Abused: No     Sexually Abused: No   Depression: Not on file   Social Connections: Moderately Isolated (12/06/2020)    Received from [facility]    Social Connection and Isolation Panel [NHANES]     Frequency of Communication with Friends and Family: More than three times a week     Frequency of Social Gatherings with Friends and Family: More than three times a week     Attends Religious Services: Never     Database administrator or Organizations: Yes     Attends Engineer, structural: More than 4 times per year     Marital Status: Never married       Would you be willing to receive help with any of the needs that you have identified today? Not applicable       SHIPPING     Specialty Medication(s) to be Shipped:   Inflammatory Disorders: Actemra    Other medication(s) to be shipped: No additional medications requested for fill at this time     Changes to insurance: No    Delivery Scheduled: Yes, Expected medication delivery date: 5/2.     Medication will be delivered via UPS to the confirmed prescription address in [facility].    The patient will receive a drug information handout for each medication shipped and additional FDA Medication Guides as required.  Verified that patient has previously received a Conservation officer, historic buildings and a Surveyor, mining.    The patient or caregiver noted above participated in the development of this care plan and knows that they can request review of or adjustments to the care plan at any time.      All of the patient's questions and concerns have been addressed.    Clydell Hakim, PharmD   [facility] Specialty Pharmacist

## 2023-04-15 ENCOUNTER — Telehealth (INDEPENDENT_AMBULATORY_CARE_PROVIDER_SITE_OTHER): Payer: Medicaid Other | Admitting: Psychiatry

## 2023-04-15 ENCOUNTER — Encounter (HOSPITAL_COMMUNITY): Payer: Self-pay | Admitting: Psychiatry

## 2023-04-15 DIAGNOSIS — F900 Attention-deficit hyperactivity disorder, predominantly inattentive type: Secondary | ICD-10-CM

## 2023-04-15 DIAGNOSIS — F319 Bipolar disorder, unspecified: Secondary | ICD-10-CM | POA: Diagnosis not present

## 2023-04-15 MED ORDER — BUPROPION HCL ER (XL) 300 MG PO TB24: 300.0000 mg | ORAL_TABLET | Freq: Every day | ORAL | 3 refills | Status: DC

## 2023-04-15 MED ORDER — ARIPIPRAZOLE 15 MG PO TABS: 15.0000 mg | ORAL_TABLET | Freq: Every day | ORAL | 3 refills | Status: DC

## 2023-04-15 MED ORDER — BENZTROPINE MESYLATE 0.5 MG PO TABS: 0.5000 mg | ORAL_TABLET | Freq: Two times a day (BID) | ORAL | 3 refills | Status: DC

## 2023-04-15 NOTE — Progress Notes (Signed)
BH MD/PA/NP OP Progress Note  Virtual Visit via Video Note  I connected with Frances [name] on 04/15/23 at 11:30 AM EDT by a video enabled telemedicine application and verified that I am speaking with the correct person using two identifiers.  Location: Patient: Home Provider: Clinic   I discussed the limitations of evaluation and management by telemedicine and the availability of in person appointments. The patient expressed understanding and agreed to proceed.  I provided 30 minutes of non-face-to-face time during this encounter.           04/15/2023 11:37 AM Frances [name]  MRN:  [id number]  Chief Complaint: "Can cogentin be increased"  HPI: 47 year old female seen today for follow up psychiatric evaluation. She has a psychiatric history of bipolar 1, borderline personality disorder, depression, and anxiety. She is currently managed on Abilify 15 mg nightly, Cogentin 0.5 mg daily, Wellbutrin 300 we will mg daily, and Cymbalta 90 mg daily (from rheumatologist). Today, patient notes that medications are somewhat effective in managing her symptoms.   Today the patient was well-groomed, pleasant, cooperative, engaged in eye contact, and engaged in conversation. She requested that Cogentin be increased.  She informed writer that at times she has abnormal movements in her fingers and legs.  Provider unable to conduct an aims assessment but schedule patient next visit for in person.   Provider conducted a GAD-7 and patient scored a 0, at her last visit she scored a 2.  Provider also conducted PHQ-9 the patient scored a 7, at her last visit she scored a 6.   She adequate appetite and reports sleeping 13 hours.  Today she denies SI/HI/VAH, mania, paranoia.    Patient reports that she continues to have bodily pains.  She reports that recently she was started on another medication to help with rheumatoid arthritis.  Today Cogentin increased from 0.5 mg daily to 0.5 mg twice daily.   She will continue her other medications as prescribed. No other concerns noted at this time.         Visit Diagnosis:    ICD-10-CM   1. Bipolar I disorder  F31.9 ARIPiprazole (ABILIFY) 15 MG tablet    buPROPion (WELLBUTRIN XL) 300 MG 24 hr tablet    benztropine (COGENTIN) 0.5 MG tablet    2. Attention deficit hyperactivity disorder (ADHD), predominantly inattentive type  F90.0 buPROPion (WELLBUTRIN XL) 300 MG 24 hr tablet      Past Psychiatric History: Bipolar, anxiety, and depression Past Medical History:  Past Medical History:  Diagnosis Date   Anxiety    Arthritis    Back pain, chronic    Bipolar 1 disorder (HCC)    Chronic fatigue syndrome    DDD (degenerative disc disease), lumbar    Depression    Diabetes mellitus without complication (HCC)    Fibromyalgia    IUD 2012   Mirena   MTHFR gene mutation     Past Surgical History:  Procedure Laterality Date   COLONOSCOPY WITH PROPOFOL N/A 02/25/2017   Procedure: COLONOSCOPY WITH PROPOFOL;  Surgeon: Willis Modena, MD;  Location: [facility];  Service: Endoscopy;  Laterality: N/A;   left knee lateral release  1993   scar tisue removal  03/2005   left ankle   TONSILLECTOMY  age 55's    Family Psychiatric History: Maternal aunts Bipolar disorder and uncle alcoholic  Family History:  Family History  Problem Relation Age of Onset   Diabetes Mother    Stroke Mother  Lupus Mother    Hypertension Mother    Congestive Heart Failure Mother    Mental illness Brother        Not clear what his diagnosis is--may be related to previous drug use.   Cancer Maternal Grandmother        colon   Diabetes Maternal Grandfather    Depression Maternal Uncle    Alcohol abuse Maternal Uncle     Social History:  Social History   Socioeconomic History   Marital status: Single    Spouse name: Not on file   Number of children: 0   Years of education: Not on file   Highest education level: Bachelor's degree (e.g., BA, AB, BS)   Occupational History   Occupation: Airline pilot at times  Tobacco Use   Smoking status: Never   Smokeless tobacco: Never  Vaping Use   Vaping Use: Never used  Substance and Sexual Activity   Alcohol use: No    Alcohol/week: 0.0 standard drinks of alcohol   Drug use: No   Sexual activity: Not on file  Other Topics Concern   Not on file  Social History Narrative   Originally from [place], outside of [place].    Moved here permanently 2012.   Intermittently worked on a farm.   Lives with many cats--3 plus fosters cats.    Single, lives alone in a one story home. Rarely drinks caffeine. 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   Social Determinants of Health   Financial Resource Strain: Medium Risk (12/06/2020)   Overall Financial Resource Strain (CARDIA)    Difficulty of Paying Living Expenses: Somewhat hard  Food Insecurity: No Food Insecurity (12/06/2020)   Hunger Vital Sign    Worried About Running Out of Food in the Last Year: Never true    Ran Out of Food in the Last Year: Never true  Transportation Needs: No Transportation Needs (12/06/2020)   PRAPARE - Administrator, Civil Service (Medical): No    Lack of Transportation (Non-Medical): No  Physical Activity: Inactive (12/06/2020)   Exercise Vital Sign    Days of Exercise per Week: 0 days    Minutes of Exercise per Session: 0 min  Stress: No Stress Concern Present (12/06/2020)   [employer] of Occupational Health - Occupational Stress Questionnaire    Feeling of Stress : Not at all  Social Connections: Moderately Isolated (12/06/2020)   Social Connection and Isolation Panel [NHANES]    Frequency of Communication with Friends and Family: More than three times a week    Frequency of Social Gatherings with Friends and Family: More than three times a week    Attends Religious Services: Never    Database administrator or Organizations: Yes    Attends Hospital doctor: More than 4 times per year    Marital Status: Never married    Allergies:  Allergies  Allergen Reactions   Metaxalone Hives    Metabolic Disorder Labs: Lab Results  Component Value Date   HGBA1C 5.2 03/10/2023   MPG 103 04/14/2018   No results found for: "PROLACTIN" Lab Results  Component Value Date   CHOL 100 09/10/2022   TRIG 103 09/10/2022   HDL 40 09/10/2022   CHOLHDL 2.5 09/10/2022   LDLCALC 41 09/10/2022   LDLCALC 89 01/20/2022   Lab Results  Component Value Date   TSH 0.866 11/21/2020   TSH 1.616 01/27/2018    Therapeutic Level Labs: No results  found for: "LITHIUM" No results found for: "VALPROATE" No results found for: "CBMZ"  Current Medications: Current Outpatient Medications  Medication Sig Dispense Refill   albuterol (VENTOLIN HFA) 108 (90 Base) MCG/ACT inhaler Take 2 puffs 15 minutes before exercise and as needed for shortness of breath. 6.7 g 1   ARIPiprazole (ABILIFY) 15 MG tablet Take 1 tablet (15 mg total) by mouth daily. 30 tablet 3   atorvastatin (LIPITOR) 20 MG tablet Take 1 tablet (20 mg total) by mouth daily. 90 tablet 3   Azelastine HCl 137 MCG/SPRAY SOLN USE 2 SPRAYS ALERNATE NOSTRILS TWICE DAILY 30 mL 2   baclofen (LIORESAL) 10 MG tablet Take 1 tablet (10 mg total) by mouth 2 (two) times daily as needed for muscle spasms. 180 each 1   benztropine (COGENTIN) 0.5 MG tablet Take 1 tablet (0.5 mg total) by mouth 2 (two) times daily. 60 tablet 3   blood glucose meter kit and supplies KIT Dispense based on patient and insurance preference. Use up to four times daily as directed. 1 each 0   buPROPion (WELLBUTRIN XL) 300 MG 24 hr tablet Take 1 tablet (300 mg total) by mouth daily. 30 tablet 3   cholecalciferol (VITAMIN D3) 25 MCG (1000 UT) tablet Take 1,000 Units by mouth daily.     diclofenac (VOLTAREN) 75 MG EC tablet Take 1 tablet (75 mg total) by mouth 2 (two) times daily. 60 tablet 0   DULoxetine HCl 30 MG CSDR Take 90 mg by mouth  daily.     fluticasone (FLONASE) 50 MCG/ACT nasal spray USE 2 SPRAYS IN EACH NOSTRIL EVERY DAY 16 g 0   HYDROcodone-acetaminophen (NORCO/VICODIN) 5-325 MG tablet Take 1 tablet by mouth 2 (two) times daily as needed for moderate pain (back pain). 60 tablet 0   hydroxychloroquine (PLAQUENIL) 200 MG tablet Take 200 mg by mouth 2 (two) times daily.  twice daily on weekdays and  once daily on weekends as of 12/03/22 review     levocetirizine (XYZAL) 5 MG tablet TAKE 1 TABLET (5 MG TOTAL) BY MOUTH EVERY EVENING. 90 tablet 1   metFORMIN (GLUCOPHAGE) 1000 MG tablet TAKE 1 Tablet  BY MOUTH TWICE DAILY WITH A MEAL 180 tablet 0   montelukast (SINGULAIR) 10 MG tablet TAKE 1 Tablet BY MOUTH ONCE EVERY NIGHT AT BEDTIME 90 tablet 0   predniSONE (DELTASONE) 50 MG tablet One tab PO daily for 5 days. 5 tablet 0   pregabalin (LYRICA) 200 MG capsule 1 capsule p.o. 2-3 times daily 90 capsule 3   Semaglutide,0.25 or 0.5MG /DOS, (OZEMPIC, 0.25 OR 0.5 MG/DOSE,) 2 MG/1.5ML SOPN Inject 0.5 mg into the skin once a week. 4.5 mL 0   No current facility-administered medications for this visit.     Musculoskeletal: Strength & Muscle Tone: within normal limits and  telehealth visit Gait & Station: normal, telehealth visit Patient leans: N/A  Psychiatric Specialty Exam: Review of Systems  There were no vitals taken for this visit.There is no height or weight on file to calculate BMI.  General Appearance: Well Groomed  Eye Contact:  Good  Speech:  Clear and Coherent and Normal Rate  Volume:  Normal  Mood:  Euthymic  Affect:  Congruent  Thought Process:  Coherent, Goal Directed and Linear  Orientation:  Full (Time, Place, and Person)  Thought Content: WDL and Logical   Suicidal Thoughts:  No  Homicidal Thoughts:  No  Memory:  Immediate;   Good Recent;   Good Remote;   Good  Judgement:  Good  Insight:  Good  Psychomotor Activity:  Normal  Concentration:  Concentration: Good and Attention Span: Good   Recall:  Good  Fund of Knowledge: Good  Language: Good  Akathisia:  No  Handed:  Right  AIMS (if indicated): Not done  Assets:  Communication Skills Desire for Improvement Financial Resources/Insurance Housing Social Support  ADL's:  Intact  Cognition: WNL  Sleep:  Good   Screenings: AIMS    Flowsheet Row [facility]  AIMS Total Score 0      AUDIT    Flowsheet Row [facility]  Alcohol Use Disorder Identification Test Final Score (AUDIT) 0      GAD-7    Flowsheet Row Video Visit from 04/15/2023 in [facility] Office Visit from 03/10/2023 in [facility] at [facility] Video Visit from 01/26/2023 in [facility] Office Visit from 12/09/2022 in [facility] at [facility] Video Visit from 11/25/2022 in [facility]  Total GAD-7 Score 0 1 2 2 4       PHQ2-9    Flowsheet Row Video Visit from 04/15/2023 in [facility] Counselor from 04/03/2023 in [place] Office Visit from 03/10/2023 in [facility] at [facility] Counselor from 03/06/2023 in [facility] Counselor from 02/13/2023 in [facility]  PHQ-2 Total Score 2 2 2 2 2   PHQ-9 Total Score 7 7 8 2 3       Flowsheet Row ED from 11/08/2022 in [facility] at [facility] ED from 10/19/2022 in [facility] at [facility] ED from 07/20/2022 in [facility] at [facility] RISK CATEGORY No Risk No Risk Error: Question 6 not populated        Assessment and Plan: Patient notes that at times she has abnormal  finger and leg movements. Today Cogentin increased from 0.5 mg daily to 0.5 mg twice daily.  She will continue her other medications as prescribed.  1. Bipolar I disorder  Continue- ARIPiprazole (ABILIFY) 15 MG tablet; Take 1 tablet (15 mg total) by mouth daily.  Dispense: 30 tablet; Refill: 3 Continue- buPROPion (WELLBUTRIN XL) 300 MG 24 hr tablet; Take 1 tablet (300 mg total) by mouth daily.  Dispense: 30 tablet; Refill: 3 Increased- benztropine (COGENTIN) 0.5 MG tablet; Take 1 tablet (0.5 mg total) by mouth 2 (two) times daily.  Dispense: 60 tablet; Refill: 3  2. Attention deficit hyperactivity disorder (ADHD), predominantly inattentive type  Continue- buPROPion (WELLBUTRIN XL) 300 MG 24 hr tablet; Take 1 tablet (300 mg total) by mouth daily.  Dispense: 30 tablet; Refill: 3   Follow up in 2.5 moths Follow up with therapy   Shanna Cisco, NP 04/15/2023, 11:37 AM

## 2023-04-21 ENCOUNTER — Ambulatory Visit (INDEPENDENT_AMBULATORY_CARE_PROVIDER_SITE_OTHER): Payer: Medicaid Other | Admitting: Clinical

## 2023-04-21 DIAGNOSIS — F319 Bipolar disorder, unspecified: Secondary | ICD-10-CM

## 2023-04-21 NOTE — Progress Notes (Signed)
THERAPIST PROGRESS NOTE Virtual Visit via Video Note  I connected with Frances [name] on 04/21/2023 at 10:00 AM EDT by a video enabled telemedicine application and verified that I am speaking with the correct person using two identifiers.  Location: Patient: home Provider: office   I discussed the limitations of evaluation and management by telemedicine and the availability of in person appointments. The patient expressed understanding and agreed to proceed.   Follow Up Instructions: I discussed the assessment and treatment plan with the patient. The patient was provided an opportunity to ask questions and all were answered. The patient agreed with the plan and demonstrated an understanding of the instructions.   The patient was advised to call back or seek an in-person evaluation if the symptoms worsen or if the condition fails to improve as anticipated.   Session Time: 25 minutes  Participation Level: Active  Behavioral Response: CasualAlertEuthymic  Type of Therapy: Individual Therapy  Treatment Goals addressed: client will score less than a 10 on the PHQ9   ProgressTowards Goals: Progressing  Interventions: CBT and Supportive  Summary:  Frances [name] is a 47 y.o. female who presents with a scheduled appointment oriented x 5, appropriately dressed, and friendly.  Client denies hallucinations or delusions. Client reported on today she is doing pretty well.  Client reported over the past few weeks she has kept herself outside of the house by attending some work shows been supportive of her friend.  Client reported the good weather and being able to be outside with friends helps to alleviate the severity of her depressive symptoms.  Client reported otherwise she is tired of being in pain from her hands, her back and her feet.  Client reported the medications her doctors have given her to help with the pain did not seem to be helping as of yet.  Client reported she does  not know how to alleviate it. Client reported she has ongoing issue with stuttering and forgetfulness. Client reported she had a medication increase from the psychiatrist to help with her tics. Client reported she will see the psychiatrist in person for her next appointment to evaluate her symptoms. Client reported she is continuing to work on her eating habits as she does not like the weight gains he has had.  Evidence of progress towards goal:  client PHQ9 score is a 7.  Flowsheet Row Video Visit from 04/15/2023 in [facility]  PHQ-9 Total Score 7        Suicidal/Homicidal: Nowithout intent/plan  Therapist Response:  Therapist began the appointment asking the client how she has been doing. Therapist used CBT to engage using active listening and positive emotional support. Therapist used CBT to ask the client about daily functioning and keeping herself interactive outside of the home. Therapist used CBT to complete SDOH. Therapist used CBT to discuss utilizing healthy eating, activity and cognitive practices to help improve mood. Therapist used CBT ask the client to identify her progress with frequency of use with coping skills with continued practice in her daily activity.    Therapist assigned the client homework to practice self care.   Plan: Return again in 3 weeks.  Diagnosis: bipolar 1 disorder  Collaboration of Care: Patient refused AEB none requested by the client.  Patient/Guardian was advised Release of Information must be obtained prior to any record release in order to collaborate their care with an outside provider. Patient/Guardian was advised if they have not already done so to contact  the registration department to sign all necessary forms in order for [place] to release information regarding their care.   Consent: Patient/Guardian gives verbal consent for treatment and assignment of benefits for services provided during this visit.  Patient/Guardian expressed understanding and agreed to proceed.   Neena Rhymes Milt Coye, LCSW 04/21/2023

## 2023-04-22 MED FILL — ACTEMRA ACTPEN 162 MG/0.9 ML SUBCUTANEOUS PEN INJECTOR: SUBCUTANEOUS | 28 days supply | Qty: 3.6 | Fill #1

## 2023-04-23 ENCOUNTER — Other Ambulatory Visit: Payer: Medicaid Other | Admitting: Pharmacist

## 2023-04-23 NOTE — Progress Notes (Signed)
04/23/2023 Name: Frances [name] MRN: [id number] DOB: 22-Jan-1976   Frances [name] is a 47 y.o. year old female who presented for a telephone visit.   They were referred to the pharmacist by their PCP for assistance in managing diabetes and hyperlipidemia.   Patient is participating in a Managed Medicaid Plan:  Yes  Subjective:  Care Team: Primary Care Provider: Nolene Ebbs ; Next Scheduled Visit: 03/10/23  Medication Access/Adherence  Current Pharmacy:  [facility] [address] [place] - [address] [place] [address] Phone: [phone] Fax: [phone]  [facility] [place], [place] - [address], [place] [address] [place], [place] [address] [place] [address] Phone: [phone] Fax: [phone]  [id number] - [place], [place] - [address] [place] [address] Phone: [phone] Fax: [phone]   Patient reports affordability concerns with their medications: No  Patient reports access/transportation concerns to their pharmacy: No  Patient reports adherence concerns with their medications:  No     Diabetes:  Current medications: metformin 1000mg  BID, ozempic 0.5mg  weekly   Current glucose readings: not currently checking, A1c at goal  Patient denies hypoglycemic s/sx including dizziness, shakiness, sweating. Patient denies hyperglycemic symptoms including polyuria, polydipsia, polyphagia, nocturia, neuropathy, blurred vision.   Current medication access support: ozempic 0.5mg  weekly via novonordisk    Hyperlipidemia/ASCVD Risk Reduction  Current lipid lowering medications: atorvastatin 20mg  daily, LDL 41    Objective:  Lab Results  Component Value Date   HGBA1C 5.2 03/10/2023    Lab Results  Component Value Date   CREATININE 0.89 09/10/2022   BUN 13 09/10/2022   NA 138 09/10/2022   K 4.7  09/10/2022   CL 99 09/10/2022   CO2 20 09/10/2022    Lab Results  Component Value Date   CHOL 100 09/10/2022   HDL 40 09/10/2022   LDLCALC 41 09/10/2022   TRIG 103 09/10/2022   CHOLHDL 2.5 09/10/2022    Medications Reviewed Today     Reviewed by Shanna Cisco, NP (Nurse Practitioner) on 04/15/23 at 1140  Med List Status: <None>   Medication Order Taking? Sig Documenting Provider Last Dose Status Informant  albuterol (VENTOLIN HFA) 108 (90 Base) MCG/ACT inhaler [id number] No Take 2 puffs 15 minutes before exercise and as needed for shortness of breath. Jomarie Longs, PA-C Taking Active Self  ARIPiprazole (ABILIFY) 15 MG tablet [id number]  Take 1 tablet (15 mg total) by mouth daily. Shanna Cisco, NP  Active   atorvastatin (LIPITOR) 20 MG tablet [id number] No Take 1 tablet (20 mg total) by mouth daily. Jomarie Longs, PA-C Taking Active   Azelastine HCl 137 MCG/SPRAY SOLN [id number] No USE 2 SPRAYS ALERNATE NOSTRILS TWICE DAILY Breeback, Jade L, PA-C Taking Active   baclofen (LIORESAL) 10 MG tablet [id number] No Take 1 tablet (10 mg total) by mouth 2 (two) times daily as needed for muscle spasms. Jomarie Longs, PA-C Taking Active   benztropine (COGENTIN) 0.5 MG tablet [id number]  Take 1 tablet (0.5 mg total) by mouth 2 (two) times daily. Toy Cookey E, NP  Active   blood glucose meter kit and supplies KIT [id number] No Dispense based on patient and insurance preference. Use up to four times daily as directed. Jomarie Longs, PA-C Taking Active   buPROPion (WELLBUTRIN XL) 300 MG 24 hr tablet [id number]  Take 1 tablet (300 mg total) by mouth  daily. Shanna Cisco, NP  Active   cholecalciferol (VITAMIN D3) 25 MCG (1000 UT) tablet [id number] No Take 1,000 Units by mouth daily. [provider] Taking Active   diclofenac (VOLTAREN) 75 MG EC tablet [id number] No Take 1 tablet (75 mg total) by mouth 2 (two) times daily. Eustace Moore, MD Taking Active    DULoxetine HCl 30 MG CSDR [id number] No Take 90 mg by mouth daily. [provider] Taking Active   fluticasone (FLONASE) 50 MCG/ACT nasal spray [id number]  USE 2 SPRAYS IN EACH NOSTRIL EVERY DAY Breeback, Jade L, PA-C  Active   HYDROcodone-acetaminophen (NORCO/VICODIN) 5-325 MG tablet [id number] No Take 1 tablet by mouth 2 (two) times daily as needed for moderate pain (back pain). Jomarie Longs, PA-C Taking Active   hydroxychloroquine (PLAQUENIL) 200 MG tablet [id number] No Take 200 mg by mouth 2 (two) times daily. 200mg  twice daily on weekdays and 200mg  once daily on weekends as of 12/03/22 review [provider] Taking Active   levocetirizine (XYZAL) 5 MG tablet [id number] No TAKE 1 TABLET (5 MG TOTAL) BY MOUTH EVERY EVENING. Jomarie Longs, PA-C Taking Active   metFORMIN (GLUCOPHAGE) 1000 MG tablet [id number]  TAKE 1 Tablet  BY MOUTH TWICE DAILY WITH A MEAL Breeback, Jade L, PA-C  Active   montelukast (SINGULAIR) 10 MG tablet [id number]  TAKE 1 Tablet BY MOUTH ONCE EVERY NIGHT AT BEDTIME Breeback, Jade L, PA-C  Active   predniSONE (DELTASONE) 50 MG tablet [id number] No One tab PO daily for 5 days. Jomarie Longs, PA-C Taking Active   pregabalin (LYRICA) 200 MG capsule [id number] No 1 capsule p.o. 2-3 times daily Monica Becton, MD Taking Active   Semaglutide,0.25 or 0.5MG /DOS, (OZEMPIC, 0.25 OR 0.5 MG/DOSE,) 2 MG/1.5ML SOPN [id number] No Inject 0.5 mg into the skin once a week. Jomarie Longs, PA-C Taking Active   Med List Note Gailen Shelter 02/20/17 1343): Pt uses [place] med assist               Assessment/Plan:   Diabetes: - Currently controlled. Updated and reviewed medication list. All is going well. - Recommend to continue current regimen  - Novonordisk PAP for ozempic is ongoing for 2024. When this runs out, under medicaid, patient will be able to obtain using RX to pharmacy, prior auth, and should be $4 for 90DS. Will support this transition if/when  we need it.   Hyperlipidemia/ASCVD Risk Reduction: - Currently controlled.  - Recommend to continue current regimen    Follow Up Plan: 3-4 months  Lynnda Shields, PharmD Clinical Pharmacist [facility] [phone]

## 2023-04-29 ENCOUNTER — Ambulatory Visit: Payer: Medicaid Other | Admitting: Sports Medicine

## 2023-05-06 ENCOUNTER — Ambulatory Visit (INDEPENDENT_AMBULATORY_CARE_PROVIDER_SITE_OTHER): Payer: Medicaid Other | Admitting: Physician Assistant

## 2023-05-06 ENCOUNTER — Encounter: Payer: Self-pay | Admitting: Physician Assistant

## 2023-05-06 VITALS — BP 109/70 | HR 103 | Resp 20 | Ht 67.0 in | Wt 176.1 lb

## 2023-05-06 DIAGNOSIS — L600 Ingrowing nail: Secondary | ICD-10-CM | POA: Diagnosis not present

## 2023-05-06 NOTE — Progress Notes (Signed)
Acute Office Visit  Subjective:     Patient ID: Frances [name], female    DOB: 05-20-1976, 47 y.o.   MRN: [id number]  Chief Complaint  Patient presents with   IN GROWN TOE NAIL    HPI Patient is in today for right ingrown toenail and pain associated. Hx of ingrown toenails. She is using some epson water soaks and seems to be helping. Hurts more when she is on her feet all day. Not hurting today.   .. 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   Lumbar spondylosis 02/10/2008   Back pain 10/13/2008   FIBROMYALGIA 02/10/2008   IRON, SERUM, ELEVATED 02/10/2008   Bipolar I disorder (HCC) 09/16/2017   Chronic fatigue syndrome 09/16/2017   MTHFR gene mutation 09/20/2017   New daily persistent headache 09/26/2017   Seronegative rheumatoid arthritis (HCC) 12/09/2017   Borderline personality disorder (HCC) 01/22/2018   Elevated fasting glucose 04/08/2018   Grief reaction 11/28/2019   Labral tear of hip, degenerative, right 01/11/2020   DDD (degenerative disc disease), cervical 03/13/2020   Ingrown right big toenail 03/13/2020   Recurrent major depressive disorder, in partial remission (HCC) 05/24/2020   Generalized anxiety disorder 05/24/2020   Symptomatic mammary hypertrophy 09/04/2020   Bilateral leg edema 11/19/2020   Seasonal allergies 12/25/2020   Type 2 diabetes mellitus without complication, without long-term current use of insulin (HCC) 03/13/2021   Hyperlipidemia LDL goal <70 03/18/2021   Attention deficit hyperactivity disorder (ADHD), 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   Overweight (BMI 25.0-29.9) 06/18/2021   SOB (shortness of breath) on exertion 08/20/2021   ETD (Eustachian tube dysfunction), bilateral 08/23/2021   Chronic right hip pain 01/06/2022   Acute pain of right shoulder 01/06/2022   Proteinuria 03/17/2022   DEGENERATIVE DISC DISEASE, LUMBAR SPINE 04/07/2022   Night sweats 06/18/2022    Microalbuminuria 09/09/2022   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   Resolved Ambulatory Problems    Diagnosis Date Noted   FEVER, RECURRENT 04/02/2009   Hypothyroidism 02/10/2008   SINUSITIS- ACUTE-NOS 03/24/2008   URI 01/17/2009   DERMATITIS, ALLERGIC 06/05/2008   ANKLE PAIN, BILATERAL 07/28/2008   FATIGUE 05/05/2008   OPEN WOUND FT NO TOE ALONE WITHOUT MENTION COMP 03/28/2009   DYSPNEA 10/15/2011   Fibromyalgia 09/16/2017   TMJ (temporomandibular joint syndrome) 09/26/2017   Anxiety 01/22/2018   Depression    Bipolar 1 disorder, depressed, severe (HCC) 01/25/2018   Decreased visual acuity 08/30/2018   Right corneal abrasion 08/30/2018   Toenail fungus 09/09/2019   Vasovagal response 09/09/2019   Chronic right-sided low back pain without sciatica 01/11/2020   Macromastia 06/29/2020   Xiphoid pain 08/13/2020   Sternum pain 08/13/2020   Class 1 obesity due to excess calories without serious comorbidity with body mass index (BMI) of 32.0 to 32.9 in adult 08/13/2020   Costochondritis 10/22/2020   Rib pain on right side 10/22/2020   Tachycardia 10/22/2020   Hepatomegaly 10/22/2020   Chest congestion 11/20/2020   Sinus drainage 11/20/2020   Ear popping, bilateral 08/20/2021   Right lateral epicondylitis 12/31/2021   Weak urine stream 03/17/2022   Acute left-sided low back pain without sciatica 04/07/2022   Acute midline low back pain without sciatica 04/07/2022   Irritant contact dermatitis due to plants, except food 06/18/2022   Ingrown toenail of right foot 09/09/2022   Chronic bilateral low back pain without sciatica 09/25/2022   Past Medical History:  Diagnosis Date   Arthritis  Back pain, chronic    Bipolar 1 disorder (HCC)    DDD (degenerative disc disease), lumbar    Diabetes mellitus without complication (HCC)    IUD 2012     ROS  See HPI.     Objective:    BP 109/70 (BP Location: Right Arm, Cuff Size: Normal)   Pulse (!) 103   Resp 20   Ht 5\' 7"   (1.702 m)   Wt 176 lb 1.3 oz (79.9 kg)   SpO2 97%   BMI 27.58 kg/m  BP Readings from Last 3 Encounters:  05/06/23 109/70  03/10/23 127/83  12/09/22 125/78   Wt Readings from Last 3 Encounters:  05/06/23 176 lb 1.3 oz (79.9 kg)  03/10/23 173 lb (78.5 kg)  12/09/22 166 lb 0.6 oz (75.3 kg)      Physical Exam Right:  Lateral nail cut at a angle and starting to grow downward Notenderness to palpation of nail edges No redness or swelling of paronychia No purulent drainage      Assessment & Plan:  Marland KitchenMarland KitchenKadie was seen today for in grown toe nail.  Diagnoses and all orders for this visit:  Ingrown right big toenail   Reassured patient not infected Does appear to be somewhat ingrown but showed her how to lift nail and prevent growth downward Continue epson water soaks Wear loose fitting shoes that do not rub Follow up as needed if symptoms worse or do not resolve to consider removal  Tandy Gaw, PA-C

## 2023-05-11 ENCOUNTER — Other Ambulatory Visit: Payer: Self-pay | Admitting: Physician Assistant

## 2023-05-11 DIAGNOSIS — M5136 Other intervertebral disc degeneration, lumbar region: Secondary | ICD-10-CM

## 2023-05-11 DIAGNOSIS — G8929 Other chronic pain: Secondary | ICD-10-CM

## 2023-05-11 DIAGNOSIS — M545 Low back pain, unspecified: Secondary | ICD-10-CM

## 2023-05-11 DIAGNOSIS — M0609 Rheumatoid arthritis without rheumatoid factor, multiple sites: Secondary | ICD-10-CM

## 2023-05-12 ENCOUNTER — Ambulatory Visit (INDEPENDENT_AMBULATORY_CARE_PROVIDER_SITE_OTHER): Payer: Medicaid Other | Admitting: Sports Medicine

## 2023-05-12 DIAGNOSIS — M25511 Pain in right shoulder: Secondary | ICD-10-CM | POA: Diagnosis not present

## 2023-05-12 DIAGNOSIS — M24159 Other articular cartilage disorders, unspecified hip: Secondary | ICD-10-CM | POA: Diagnosis not present

## 2023-05-12 MED ORDER — HYDROCODONE-ACETAMINOPHEN 5-325 MG PO TABS: 1.0000 | ORAL_TABLET | Freq: Two times a day (BID) | ORAL | 0 refills | Status: DC | PRN

## 2023-05-12 NOTE — Patient Instructions (Signed)
Nonsurgical options for your friend's knee arthritis include PRP (platelet rich plasma) injection, viscosupplementation, and genicular artery embolization.

## 2023-05-12 NOTE — Assessment & Plan Note (Signed)
Known hip pain, she has a known labral tear from an MR arthrography performed several years ago, she had a steroid injection in November 2022, she had another 1 in August 2023 which seem to have helped for almost a year. Since we just did 2 steroid injections 6 weeks ago we will hold off on any additional injections and I will have her do some formal PT.

## 2023-05-12 NOTE — Addendum Note (Signed)
Addended by: Monica Becton on: 05/12/2023 02:19 PM   Modules accepted: Orders

## 2023-05-12 NOTE — Progress Notes (Signed)
    Procedures performed today:    None.  Independent interpretation of notes and tests performed by another provider:   None.  Brief History, Exam, Impression, and Recommendations:    Acute pain of right shoulder Pleasant 47 year old female with chronic right shoulder pain, MRI did show subacromial bursitis and glenohumeral osteoarthritis, early April I injected her glenohumeral joint and her subacromial bursa, she returns today for the most part pain-free. Return to see me as needed.  Labral tear of hip, degenerative, right Known hip pain, she has a known labral tear from an MR arthrography performed several years ago, she had a steroid injection in November 2022, she had another 1 in August 2023 which seem to have helped for almost a year. Since we just did 2 steroid injections 6 weeks ago we will hold off on any additional injections and I will have her do some formal PT.    ____________________________________________ Ihor Austin. Benjamin Stain, M.D., ABFM., CAQSM., AME. Primary Care and Sports Medicine [facility]  Adjunct Professor of Family Medicine  [place] [employer] [facility] [employer] [facility]  Restaurant manager, fast food

## 2023-05-12 NOTE — Telephone Encounter (Signed)
..  PDMP reviewed during this encounter. No concerns.  Norco refilled.

## 2023-05-12 NOTE — Assessment & Plan Note (Signed)
Pleasant 47 year old female with chronic right shoulder pain, MRI did show subacromial bursitis and glenohumeral osteoarthritis, early April I injected her glenohumeral joint and her subacromial bursa, she returns today for the most part pain-free. Return to see me as needed.

## 2023-05-13 ENCOUNTER — Telehealth: Payer: Self-pay | Admitting: Physician Assistant

## 2023-05-13 ENCOUNTER — Ambulatory Visit (HOSPITAL_COMMUNITY): Payer: No Payment, Other | Admitting: Clinical

## 2023-05-13 ENCOUNTER — Other Ambulatory Visit: Payer: Self-pay | Admitting: Physician Assistant

## 2023-05-13 DIAGNOSIS — E1165 Type 2 diabetes mellitus with hyperglycemia: Secondary | ICD-10-CM

## 2023-05-13 MED ORDER — METFORMIN HCL 1000 MG PO TABS: 1000.0000 mg | ORAL_TABLET | Freq: Two times a day (BID) | ORAL | 0 refills | Status: DC

## 2023-05-13 NOTE — Telephone Encounter (Signed)
Patient called she needs a PA on Hydrocodone-Acetaminophen 5-325mg  Please advise her pharmacy is [facility]

## 2023-05-13 NOTE — Unmapped (Signed)
[facility] Refill Coordination Note    Specialty Medication(s) to be Shipped:   Inflammatory Disorders: Actemra    Other medication(s) to be shipped: No additional medications requested for fill at this time     Alice Howell, DOB: [birth year]  Phone: [phone] (home)       All above HIPAA information was verified with patient.     Was a Nurse, learning disability used for this call? No    Completed refill call assessment today to schedule patient's medication shipment from the [facility] [phone]).  All relevant notes have been reviewed.     Specialty medication(s) and dose(s) confirmed: Regimen is correct and unchanged.   Changes to medications: [name] reports no changes at this time.  Changes to insurance: No  New side effects reported not previously addressed with a pharmacist or physician: None reported  Questions for the pharmacist: No    Confirmed patient received a Conservation officer, historic buildings and a Surveyor, mining with first shipment. The patient will receive a drug information handout for each medication shipped and additional FDA Medication Guides as required.       DISEASE/MEDICATION-SPECIFIC INFORMATION        For patients on injectable medications: Patient currently has 1 doses left.  Next injection is scheduled for 05/19/23.    SPECIALTY MEDICATION ADHERENCE     Medication Adherence    Patient reported X missed doses in the last month: 0  Specialty Medication: ACTEMRA ACTPEN 162 mg/0.9 mL Pnij (tocilizumab)  Patient is on additional specialty medications: No  Patient is on more than two specialty medications: No  Any gaps in refill history greater than 2 weeks in the last 3 months: no  Demonstrates understanding of importance of adherence: yes  Informant: patient  Reliability of informant: reliable  Provider-estimated medication adherence level: good  Patient is at risk for Non-Adherence: No  Reasons for non-adherence: no problems identified              Were doses missed due to medication being on hold? No    ACTEMRA ACTPEN 162 mg/0.9 mL Pnij (tocilizumab)  : 7 days of medicine on hand       REFERRAL TO PHARMACIST     Referral to the pharmacist: Not needed      [facility]     Shipping address confirmed in Epic.       Delivery Scheduled: Yes, Expected medication delivery date: 05/21/23.     Medication will be delivered via UPS to the prescription address in Epic WAM.    Alice Howell[facility] Specialty Technician

## 2023-05-14 ENCOUNTER — Encounter: Payer: Self-pay | Admitting: Physician Assistant

## 2023-05-14 ENCOUNTER — Encounter: Payer: Self-pay | Admitting: Sports Medicine

## 2023-05-14 MED ORDER — FLUTICASONE PROPIONATE 50 MCG/ACT NA SUSP: 2.0000 | Freq: Every day | NASAL | 0 refills | Status: DC

## 2023-05-20 ENCOUNTER — Telehealth: Payer: Self-pay | Admitting: Physician Assistant

## 2023-05-20 MED FILL — ACTEMRA ACTPEN 162 MG/0.9 ML SUBCUTANEOUS PEN INJECTOR: SUBCUTANEOUS | 28 days supply | Qty: 3.6 | Fill #2

## 2023-05-20 NOTE — Telephone Encounter (Signed)
Pt called. PA required on her Hydrocodone.

## 2023-05-25 ENCOUNTER — Ambulatory Visit (INDEPENDENT_AMBULATORY_CARE_PROVIDER_SITE_OTHER): Payer: Medicaid Other

## 2023-05-25 ENCOUNTER — Ambulatory Visit (INDEPENDENT_AMBULATORY_CARE_PROVIDER_SITE_OTHER): Payer: Medicaid Other | Admitting: Sports Medicine

## 2023-05-25 DIAGNOSIS — S9781XA Crushing injury of right foot, initial encounter: Secondary | ICD-10-CM

## 2023-05-25 NOTE — Assessment & Plan Note (Signed)
Pleasant 47 year old female, approximately 5 years ago her horse stepped on her right foot, she had immediate pain, swelling, bruising but she was able to bear weight, she waited about 5 weeks, she presents today with a minimally tender nodule dorsum midfoot at the base of the metatarsals.  She is able to bear weight, she is good motion, good strength, I think this is an organizing hematoma, we will get x-rays and she will apply warm compresses and massage to the area.

## 2023-05-25 NOTE — Telephone Encounter (Signed)
Pt is currently out of medication and is requesting an update on PA.

## 2023-05-25 NOTE — Progress Notes (Signed)
    Procedures performed today:    None.  Independent interpretation of notes and tests performed by another provider:   None.  Brief History, Exam, Impression, and Recommendations:    Crush injury of right foot Pleasant 47 year old female, approximately 5 years ago her horse stepped on her right foot, she had immediate pain, swelling, bruising but she was able to bear weight, she waited about 5 weeks, she presents today with a minimally tender nodule dorsum midfoot at the base of the metatarsals.  She is able to bear weight, she is good motion, good strength, I think this is an organizing hematoma, we will get x-rays and she will apply warm compresses and massage to the area.    ____________________________________________ Ihor Austin. Benjamin Stain, M.D., ABFM., CAQSM., AME. Primary Care and Sports Medicine [facility]  Adjunct Professor of Family Medicine  [place] [employer] [facility]  Restaurant manager, fast food

## 2023-05-28 ENCOUNTER — Other Ambulatory Visit: Payer: Self-pay | Admitting: Physician Assistant

## 2023-05-28 ENCOUNTER — Ambulatory Visit (INDEPENDENT_AMBULATORY_CARE_PROVIDER_SITE_OTHER): Payer: Medicaid Other | Admitting: Clinical

## 2023-05-28 DIAGNOSIS — F319 Bipolar disorder, unspecified: Secondary | ICD-10-CM | POA: Diagnosis not present

## 2023-05-28 DIAGNOSIS — R0989 Other specified symptoms and signs involving the circulatory and respiratory systems: Secondary | ICD-10-CM

## 2023-05-28 DIAGNOSIS — J310 Chronic rhinitis: Secondary | ICD-10-CM

## 2023-05-28 DIAGNOSIS — J3489 Other specified disorders of nose and nasal sinuses: Secondary | ICD-10-CM

## 2023-05-29 NOTE — Progress Notes (Signed)
THERAPIST PROGRESS NOTE Virtual Visit via Video Note  I connected with Frances [name] on 05/28/2023 at  2:00 PM EDT by a video enabled telemedicine application and verified that I am speaking with the correct person using two identifiers.  Location: Patient: home Provider: office   I discussed the limitations of evaluation and management by telemedicine and the availability of in person appointments. The patient expressed understanding and agreed to proceed.  Follow Up Instructions: I discussed the assessment and treatment plan with the patient. The patient was provided an opportunity to ask questions and all were answered. The patient agreed with the plan and demonstrated an understanding of the instructions.   The patient was advised to call back or seek an in-person evaluation if the symptoms worsen or if the condition fails to improve as anticipated.    Session Time: 20 minutes  Participation Level: Active  Behavioral Response: CasualAlertDepressed  Type of Therapy: Individual Therapy  Treatment Goals addressed: client will score less than 10 on the patient health questionnaire  ProgressTowards Goals: Progressing  Interventions: CBT and Supportive  Summary:  Frances [name] is a 47 y.o. female who presents for the scheduled appointment oriented times five, appropriately dressed and friendly. Client denied hallucinations and delusions. Client reported she has been doing fairly okay. Client reported she has a foot injury from her pony stepping on it. Client reported she is going to the doctor to have it checked. Client reported otherwise she has been struggling to pay her bills. Client reported she is thinking about asking her sister for some help. Client reported anxiety is not present. Client reported "the depression is still there" but it is minimal. Client reported she rates the depression at a "4". Client reported no other complaints at this time. Client reported  the depression is manageable. Client reported she has no other identifiable goals she wants to work on in therapy and is agreeable to cease ongoing appointments at this time. Client reported she is understanding she can book more appointments in the future. Evidence of progress towards goal:  client PHQ9 score has maintained below 10 for more than 4 weeks.  Flowsheet Row Counselor from 05/28/2023 in [facility]  PHQ-9 Total Score 4        Suicidal/Homicidal: Nowithout intent/plan  Therapist Response:  Therapist began the appointment asking the client how she has been doing since last seen. Therapist used CBT to engage using active listening and positive emotional support. Therapist used CBT to engage and ask the client how she has been feeling emotionally and/or if there are any identifiable stress. Therapist used CBT to ask the client about severity of symptoms and complete SDOH. Therapist used CBT to ask the client open ended questions about her continued goals for treatment.  Therapist updated the treatment plan. Therapist used CBT ask the client to identify her progress with frequency of use with coping skills with continued practice in her daily activity.    Therapist discussed discharge planning form therapy at this time.   Plan: Client will discontinue therapy at this time. Client will continue with psychiatry appointments as scheduled.  Diagnosis: Bipolar 1 disorder  Collaboration of Care: Patient refused AEB none requested by the client at this time.  Patient/Guardian was advised Release of Information must be obtained prior to any record release in order to collaborate their care with an outside provider. Patient/Guardian was advised if they have not already done so to contact the registration department to  sign all necessary forms in order for [place] to release information regarding their care.   Consent: Patient/Guardian gives verbal consent for  treatment and assignment of benefits for services provided during this visit. Patient/Guardian expressed understanding and agreed to proceed.   Neena Rhymes Tryone Kille, LCSW 05/28/2023

## 2023-06-03 ENCOUNTER — Other Ambulatory Visit: Payer: Self-pay

## 2023-06-03 ENCOUNTER — Ambulatory Visit: Payer: Medicaid Other | Attending: Sports Medicine | Admitting: Physical Therapy

## 2023-06-03 ENCOUNTER — Encounter: Payer: Self-pay | Admitting: Physical Therapy

## 2023-06-03 DIAGNOSIS — M6281 Muscle weakness (generalized): Secondary | ICD-10-CM | POA: Insufficient documentation

## 2023-06-03 DIAGNOSIS — G8929 Other chronic pain: Secondary | ICD-10-CM | POA: Diagnosis present

## 2023-06-03 DIAGNOSIS — M25551 Pain in right hip: Secondary | ICD-10-CM | POA: Insufficient documentation

## 2023-06-03 DIAGNOSIS — M545 Low back pain, unspecified: Secondary | ICD-10-CM | POA: Insufficient documentation

## 2023-06-03 DIAGNOSIS — R293 Abnormal posture: Secondary | ICD-10-CM | POA: Insufficient documentation

## 2023-06-03 DIAGNOSIS — M24159 Other articular cartilage disorders, unspecified hip: Secondary | ICD-10-CM | POA: Diagnosis not present

## 2023-06-03 DIAGNOSIS — R2689 Other abnormalities of gait and mobility: Secondary | ICD-10-CM | POA: Insufficient documentation

## 2023-06-03 NOTE — Therapy (Signed)
OUTPATIENT PHYSICAL THERAPY LOWER EXTREMITY EVALUATION   Patient Name: Frances [name] MRN: [id number] DOB:05-Jan-1976, 47 y.o., female Today's Date: 06/03/2023  END OF SESSION:  PT End of Session - 06/03/23 1407     Visit Number 1    Number of Visits 8    Date for PT Re-Evaluation 07/29/23    Authorization Type [place] [employer] -- initial 3 visit auth requested 06/04/23    Authorization - Visit Number 0    Authorization - Number of Visits 3    PT Start Time 1405    PT Stop Time 1445    PT Time Calculation (min) 40 min             Past Medical History:  Diagnosis Date   Anxiety    Arthritis    Back pain, chronic    Bipolar 1 disorder (HCC)    Chronic fatigue syndrome    DDD (degenerative disc disease), lumbar    Depression    Diabetes mellitus without complication (HCC)    Fibromyalgia    IUD 2012   Mirena   MTHFR gene mutation    Past Surgical History:  Procedure Laterality Date   COLONOSCOPY WITH PROPOFOL N/A 02/25/2017   Procedure: COLONOSCOPY WITH PROPOFOL;  Surgeon: Willis Modena, MD;  Location: [facility];  Service: Endoscopy;  Laterality: N/A;   left knee lateral release  1993   scar tisue removal  03/2005   left ankle   TONSILLECTOMY  age 58's   Patient Active Problem List   Diagnosis Date Noted   Crush injury of right foot 05/25/2023   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   Microalbuminuria 09/09/2022   Night sweats 06/18/2022   DEGENERATIVE DISC DISEASE, LUMBAR SPINE 04/07/2022   Proteinuria 03/17/2022   Chronic right hip pain 01/06/2022   Acute pain of right shoulder 01/06/2022   ETD (Eustachian tube dysfunction), bilateral 08/23/2021   SOB (shortness of breath) on exertion 08/20/2021   Overweight (BMI 25.0-29.9) 06/18/2021   Attention deficit hyperactivity disorder (ADHD), 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   Hyperlipidemia LDL goal <70 03/18/2021   Type 2 diabetes mellitus without complication, without long-term current use of insulin  (HCC) 03/13/2021   Seasonal allergies 12/25/2020   Bilateral leg edema 11/19/2020   Symptomatic mammary hypertrophy 09/04/2020   Recurrent major depressive disorder, in partial remission (HCC) 05/24/2020   Generalized anxiety disorder 05/24/2020   DDD (degenerative disc disease), cervical 03/13/2020   Ingrown right big toenail 03/13/2020   Labral tear of hip, degenerative, right 01/11/2020   Grief reaction 11/28/2019   Elevated fasting glucose 04/08/2018   Borderline personality disorder (HCC) 01/22/2018   Seronegative rheumatoid arthritis (HCC) 12/09/2017   New daily persistent headache 09/26/2017   MTHFR gene mutation 09/20/2017   Bipolar I disorder (HCC) 09/16/2017   Chronic fatigue syndrome 09/16/2017   Back pain 10/13/2008   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 Pt reports she rides horses and when she first gets on/off it will  hurt. Pt states she will get sharp pain. Feels that it happens randomly. Can feel a catching sensation -- sometimes with standing. Pt states left hip has improved. Pt has not been doing any stretches for hips. Pt states R hip has been injected several times.   PERTINENT HISTORY: Arthritis, back pain  PAIN:  Are you having pain? Yes: NPRS scale: 1 or 2 currently; at worst 9/10 Pain location: R groin Pain description: sharp and brief Aggravating factors: certain movements Relieving factors:  Rest  PRECAUTIONS: None  WEIGHT BEARING RESTRICTIONS: No  FALLS:  Has patient fallen in last 6 months? No  LIVING ENVIRONMENT: Lives with: lives alone Lives in: Mobile home Stairs: Yes: External: 1-2 steps; none Has following equipment at home: None  OCCUPATION: Currently not working; horseback riding, coloring, TV  PLOF: Independent  PATIENT GOALS: Lessen the pain; try not to get surgery  NEXT MD VISIT: n/a  OBJECTIVE:   DIAGNOSTIC FINDINGS: Hip MRI 06/15/18 IMPRESSION: 1. Anterior right labral degeneration with a tear.     Electronically Signed   By: Elige Ko   On: 06/15/2018 14:28  PATIENT SURVEYS:  FOTO 52; predicted 62  COGNITION: Overall cognitive status: Within functional limits for tasks assessed     SENSATION: WFL -- N/T in left leg  EDEMA:  None  MUSCLE LENGTH: Hamstrings: WFL Thomas test: Right -5 deg; Left neutral deg  POSTURE: rounded shoulders  PALPATION: TTP around R greater trochanter, glute med, lateral hamstring  LOWER EXTREMITY ROM:  Active ROM Right eval Left eval  Hip flexion    Hip extension    Hip abduction    Hip adduction    Hip internal rotation    Hip external rotation    Knee flexion    Knee extension    Ankle dorsiflexion    Ankle plantarflexion    Ankle inversion    Ankle eversion     (Blank rows = not tested)  LOWER EXTREMITY MMT:  MMT Right eval Left eval  Hip flexion 4 5  Hip extension 3 4-  Hip abduction 4 4+  Hip adduction 3- 3  Hip internal rotation 4 4  Hip external rotation 3+ 4-  Knee flexion 5 4+  Knee extension 5 5  Ankle dorsiflexion    Ankle plantarflexion    Ankle inversion    Ankle eversion     (Blank rows = not tested)  LOWER EXTREMITY SPECIAL TESTS:  Hip special tests: Luisa Hart (FABER) test: positive , Trendelenburg test: positive , Thomas test: negative, Craig's test: negative, and Ely's test: negative; Painful with FADIR  FUNCTIONAL TESTS:  Did not  assess  GAIT: Distance walked: 100' Assistive device utilized: None Level of assistance: Complete Independence Comments: Normal reciprocal pattern   TODAY'S TREATMENT:                                                                                                                              DATE:  06/03/23 See HEP below    PATIENT EDUCATION:  Education details: Exam findings, POC, initial HEP Person educated: Patient Education method: Explanation, Demonstration, and Handouts Education comprehension: verbalized understanding, returned demonstration, and needs further education  HOME EXERCISE PROGRAM: Access Code: [email or web address] URL: [email or web address] Date: 06/03/2023 Prepared by: Vernon Prey April Kirstie Peri  Exercises - Hip Flexor Stretch at [facility] of Bed  - 1 x daily - 7 x weekly - 2 sets - 30 sec hold - Upright Side Lunge  - 1 x daily - 7 x weekly - 2 sets - 10 reps - Prone Heel Squeeze  - 1 x daily - 7 x weekly - 2 sets - 10 reps - 3 sec hold - Prone Bent Leg Fallout  - 1 x daily - 7 x weekly - 2 sets - 10 reps - Standing Marching  - 1 x daily - 7 x weekly - 2 sets - 10 reps  ASSESSMENT:  CLINICAL IMPRESSION: Patient is a 47 y.o. F who was seen today for physical therapy evaluation and treatment for R hip pain. Pt notes pain is intermittent, sharp and brief primarily in her groin. Assessment significant for R>L hip weakness especially in glutes, adductors and rotators. Pt would benefit from PT to address these issues for pain free community activity and recreational activities.   OBJECTIVE IMPAIRMENTS: decreased activity tolerance, decreased endurance, decreased mobility, decreased strength, increased fascial restrictions, increased muscle spasms, improper body mechanics, and pain.   ACTIVITY LIMITATIONS: bending, hygiene/grooming, and locomotion level  PARTICIPATION LIMITATIONS: community activity and horse back riding  PERSONAL FACTORS: Age, Fitness,  Past/current experiences, and Time since onset of injury/illness/exacerbation are also affecting patient's functional outcome.   REHAB POTENTIAL: Good  CLINICAL DECISION MAKING: Stable/uncomplicated  EVALUATION COMPLEXITY: Low   GOALS: Goals reviewed with patient? Yes  SHORT TERM GOALS: Target date: 07/01/2023  Pt will be ind with initial HEP Baseline: Goal status: INITIAL  2.  Pt will demo improved hip adduction on R to at least 3+/5 Baseline:  Goal status: INITIAL   LONG TERM GOALS: Target date: 07/29/2023   Pt will be ind with progression and management of HEP Baseline:  Goal status: INITIAL  2.  Pt will report improved pain with horse back riding to </=50% Baseline:  Goal status: INITIAL  3.  Pt will have improved FOTO to >/=62 Baseline:  Goal status: INITIAL  4.  Pt will report worst pain to </=6/10 Baseline:  Goal status: INITIAL  PLAN:  PT FREQUENCY: 1x/week  PT DURATION: 8 weeks  PLANNED INTERVENTIONS: Therapeutic exercises, Therapeutic activity, Neuromuscular re-education, Balance training, Gait training, Patient/Family education, Self Care, Joint mobilization, Stair training, Aquatic Therapy, Dry Needling, Electrical stimulation, Spinal mobilization, Cryotherapy, Moist heat, Taping, Ionotophoresis 4mg /ml Dexamethasone, Manual therapy, and Re-evaluation  PLAN FOR NEXT SESSION: Assess response to HEP. Continue R hip strengthening and stabilization exercises as tolerated.    Natalya Domzalski April Ma L Keierra Nudo, PT 06/03/2023, 4:28 PM

## 2023-06-09 ENCOUNTER — Encounter: Payer: Self-pay | Admitting: Physician Assistant

## 2023-06-10 ENCOUNTER — Ambulatory Visit: Payer: Medicaid Other | Admitting: Physician Assistant

## 2023-06-10 ENCOUNTER — Encounter: Payer: Self-pay | Admitting: Rehabilitative and Restorative Service Providers"

## 2023-06-10 ENCOUNTER — Ambulatory Visit: Payer: Medicaid Other | Admitting: Rehabilitative and Restorative Service Providers"

## 2023-06-10 ENCOUNTER — Encounter: Payer: Self-pay | Admitting: Physician Assistant

## 2023-06-10 VITALS — BP 116/77 | HR 99 | Ht 67.0 in | Wt 178.0 lb

## 2023-06-10 DIAGNOSIS — M6281 Muscle weakness (generalized): Secondary | ICD-10-CM | POA: Diagnosis not present

## 2023-06-10 DIAGNOSIS — G8929 Other chronic pain: Secondary | ICD-10-CM

## 2023-06-10 DIAGNOSIS — E1169 Type 2 diabetes mellitus with other specified complication: Secondary | ICD-10-CM

## 2023-06-10 DIAGNOSIS — S9781XD Crushing injury of right foot, subsequent encounter: Secondary | ICD-10-CM | POA: Diagnosis not present

## 2023-06-10 DIAGNOSIS — Z7984 Long term (current) use of oral hypoglycemic drugs: Secondary | ICD-10-CM

## 2023-06-10 DIAGNOSIS — R2689 Other abnormalities of gait and mobility: Secondary | ICD-10-CM

## 2023-06-10 DIAGNOSIS — M25551 Pain in right hip: Secondary | ICD-10-CM

## 2023-06-10 DIAGNOSIS — G894 Chronic pain syndrome: Secondary | ICD-10-CM | POA: Diagnosis not present

## 2023-06-10 DIAGNOSIS — R293 Abnormal posture: Secondary | ICD-10-CM

## 2023-06-10 LAB — POCT GLYCOSYLATED HEMOGLOBIN (HGB A1C): Hemoglobin A1C: 5.5 % (ref 4.0–5.6)

## 2023-06-10 MED ORDER — METFORMIN HCL 1000 MG PO TABS: 1000.0000 mg | ORAL_TABLET | Freq: Two times a day (BID) | ORAL | 3 refills | Status: DC

## 2023-06-10 NOTE — Therapy (Signed)
OUTPATIENT PHYSICAL THERAPY LOWER EXTREMITY EVALUATION   Patient Name: Frances [name] MRN: [id number] DOB:09-11-1976, 47 y.o., female Today's Date: 06/10/2023  END OF SESSION:  PT End of Session - 06/10/23 0845     Visit Number 2    Number of Visits 8    Date for PT Re-Evaluation 07/29/23    Authorization Type [place] [employer] -- initial 3 visit auth requested 06/04/23    Authorization - Visit Number 1    Authorization - Number of Visits 3    PT Start Time 0845    PT Stop Time 0930    PT Time Calculation (min) 45 min    Activity Tolerance Patient tolerated treatment well             Past Medical History:  Diagnosis Date   Anxiety    Arthritis    Back pain, chronic    Bipolar 1 disorder (HCC)    Chronic fatigue syndrome    DDD (degenerative disc disease), lumbar    Depression    Diabetes mellitus without complication (HCC)    Fibromyalgia    IUD 2012   Mirena   MTHFR gene mutation    Past Surgical History:  Procedure Laterality Date   COLONOSCOPY WITH PROPOFOL N/A 02/25/2017   Procedure: COLONOSCOPY WITH PROPOFOL;  Surgeon: Frances Modena, MD;  Location: [facility];  Service: Endoscopy;  Laterality: N/A;   left knee lateral release  1993   scar tisue removal  03/2005   left ankle   TONSILLECTOMY  age 73's   Patient Active Problem List   Diagnosis Date Noted   Crush injury of right foot 05/25/2023   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   Microalbuminuria 09/09/2022   Night sweats 06/18/2022   DEGENERATIVE DISC DISEASE, LUMBAR SPINE 04/07/2022   Proteinuria 03/17/2022   Chronic right hip pain 01/06/2022   Acute pain of right shoulder 01/06/2022   ETD (Eustachian tube dysfunction), bilateral 08/23/2021   SOB (shortness of breath) on exertion 08/20/2021   Overweight (BMI 25.0-29.9) 06/18/2021   Attention deficit hyperactivity disorder (ADHD), 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   Hyperlipidemia LDL goal <70 03/18/2021   Type 2 diabetes mellitus  without complication, without long-term current use of insulin (HCC) 03/13/2021   Seasonal allergies 12/25/2020   Bilateral leg edema 11/19/2020   Symptomatic mammary hypertrophy 09/04/2020   Recurrent major depressive disorder, in partial remission (HCC) 05/24/2020   Generalized anxiety disorder 05/24/2020   DDD (degenerative disc disease), cervical 03/13/2020   Ingrown right big toenail 03/13/2020   Labral tear of hip, degenerative, right 01/11/2020   Grief reaction 11/28/2019   Elevated fasting glucose 04/08/2018   Borderline personality disorder (HCC) 01/22/2018   Seronegative rheumatoid arthritis (HCC) 12/09/2017   New daily persistent headache 09/26/2017   MTHFR gene mutation 09/20/2017   Bipolar I disorder (HCC) 09/16/2017   Chronic fatigue syndrome 09/16/2017   Back pain 10/13/2008   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 She has not had  Vicodin in the past 3 weeks. Vicodin helps the back and maybe helps the hip a "little bit". Has not done exercises but not consistently. Would like some stretches she can do quickly before she rides her horse.   EVAL: Pt reports she has a R hip labral tear and arthritis. Pt reports she rides horses and when she first gets on/off it will hurt. Pt states she will get sharp pain. Feels that it happens randomly. Can feel a catching sensation -- sometimes with standing. Pt states left hip  has improved. Pt has not been doing any stretches for hips. Pt states R hip has been injected several times.   PERTINENT HISTORY: Arthritis, back pain  PAIN:  Are you having pain? Yes: NPRS scale: 4/10 currently; at worst 9/10 Pain location: R groin Pain description: sharp and brief Aggravating factors: certain movements Relieving factors: Rest  PRECAUTIONS: None  WEIGHT BEARING RESTRICTIONS: No  FALLS:  Has patient fallen in last 6 months? No  LIVING ENVIRONMENT: Lives with: lives alone Lives in: Mobile home Stairs: Yes: External: 1-2 steps; none Has following equipment at home: None  OCCUPATION: Currently not working; horseback riding, coloring, TV  PATIENT GOALS: Lessen the pain; try not to get surgery  NEXT MD VISIT: n/a  OBJECTIVE:   DIAGNOSTIC FINDINGS: Hip MRI 06/15/18 IMPRESSION: 1. Anterior right labral degeneration with a tear.   PATIENT SURVEYS:  FOTO 52; predicted 71  SENSATION: WFL -- N/T in left leg  MUSCLE LENGTH: Hamstrings: WFL Thomas test: Right -5 deg; Left neutral deg  POSTURE: rounded shoulders  PALPATION: TTP around R greater trochanter, glute med, lateral hamstring  LOWER EXTREMITY ROM:  Active ROM Right eval Left eval  Hip flexion    Hip extension    Hip abduction    Hip adduction    Hip internal rotation    Hip external rotation    Knee flexion    Knee extension    Ankle dorsiflexion    Ankle plantarflexion    Ankle inversion    Ankle eversion     (Blank rows = not tested)  LOWER EXTREMITY MMT:  MMT Right eval Left eval  Hip flexion 4 5  Hip extension 3 4-  Hip abduction 4 4+  Hip adduction 3- 3  Hip internal rotation 4 4  Hip external rotation 3+ 4-  Knee flexion 5 4+  Knee extension 5 5  Ankle dorsiflexion    Ankle plantarflexion    Ankle inversion    Ankle eversion     (Blank rows = not tested)  LOWER EXTREMITY SPECIAL TESTS:  Hip special tests: Frances [name] (FABER) test: positive , Trendelenburg test:  positive , Thomas test: negative, Craig's test: negative, and Ely's test: negative; Painful with FADIR  FUNCTIONAL TESTS:  Did not assess  GAIT: Distance walked: 100' Assistive device utilized: None Level of assistance: Complete Independence Comments: Normal reciprocal pattern   [facility] Adult PT Treatment:                                                DATE: 06/10/23 Therapeutic Exercise: Nustep L5 x 6 min  Sitting  Hip flexor stretch 30 sec x 3 VC to engage core R/L  Standing  Side hip lunge x 10 R/L foot on wash cloth  Side hip lunge stretch 20 sec x 2 R/L  Prone  Heel  squeeze 3 sec x 10 x 2  Bent knee fall outs x 10 x 2  Glut squeeze w/ core engaged 5 sec x 10 x 2 Supine  4 part core 10 sec x 10  Shoulder flexion alt R/L with core engaged x 10  Pallof press blue TB 3 sec x 10 R/L VC to engage core    Self Care: Discussed the importance of consistent exercise     PATIENT EDUCATION:  Education details: Exam findings, POC, initial HEP Person educated: Patient Education method: Explanation, Demonstration, and Handouts Education comprehension: verbalized understanding, returned demonstration, and needs further education  HOME EXERCISE PROGRAM:  Access Code: [email or web address] URL: [email or web address] Date: 06/10/2023 Prepared by: Corlis Leak  Exercises - Hip Flexor Stretch at Edge of Bed  - 1 x daily - 7 x weekly - 2 sets - 30 sec hold - Upright Side Lunge  - 1 x daily - 7 x weekly - 2 sets - 10 reps - Prone Heel Squeeze  - 1 x daily - 7 x weekly - 2 sets - 10 reps - 3 sec hold - Prone Bent Leg Fallout  - 1 x daily - 7 x weekly - 2 sets - 10 reps - Standing Marching  - 1 x daily - 7 x weekly - 2 sets - 10 reps - Seated Hip Flexor Stretch  - 2 x daily - 7 x weekly - 1 sets - 3 reps - 30 sec  hold - Prone Gluteal Sets  - 1 x daily - 7 x weekly - 2 sets - 10 reps - 5 sec  hold - Supine Transversus Abdominis Bracing with Pelvic Floor Contraction  - 2 x daily - 7 x  weekly - 1 sets - 10 reps - 10sec  hold - Supine Alternating Shoulder Flexion  - 2 x daily - 7 x weekly - 1-2 sets - 10 reps - 2 sec  hold - Anti-Rotation Lateral Stepping with Press  - 2 x daily - 7 x weekly - 1-2 sets - 10 reps - 2-3 sec  hold  ASSESSMENT:  CLINICAL IMPRESSION: Patient has been inconsistent with HEP. Patient requested quick stretches she can do before riding. Provided stretches in sitting and standing to add to program. Encouraged consistent HEP.   Eval: Patient is a 47 y.o. F who was seen today for physical therapy evaluation and treatment for R hip pain. Pt notes pain is intermittent, sharp and brief primarily in her groin. Assessment significant for R>L hip weakness especially in glutes, adductors and rotators. Pt would benefit from PT to address these issues for pain free community activity and recreational activities.   OBJECTIVE IMPAIRMENTS: decreased activity tolerance, decreased endurance, decreased mobility, decreased strength, increased fascial restrictions, increased muscle spasms, improper body mechanics, and pain.    GOALS: Goals reviewed with patient? Yes  SHORT TERM GOALS: Target date: 07/01/2023  Pt will be ind with initial HEP Baseline: Goal status: INITIAL  2.  Pt will demo improved hip adduction on R to at least 3+/5 Baseline:  Goal status: INITIAL   LONG TERM GOALS: Target date: 07/29/2023   Pt will be ind with progression and management of HEP Baseline:  Goal status: INITIAL  2.  Pt will report improved pain with horse back riding to </=50% Baseline:  Goal status: INITIAL  3.  Pt will have improved FOTO to >/=62 Baseline:  Goal status: INITIAL  4.  Pt will report worst pain to </=6/10 Baseline:  Goal status: INITIAL  PLAN:  PT FREQUENCY: 1x/week  PT DURATION: 8 weeks  PLANNED INTERVENTIONS: Therapeutic exercises, Therapeutic activity, Neuromuscular re-education, Balance training, Gait training, Patient/Family education, Self  Care, Joint mobilization, Stair training, Aquatic Therapy, Dry Needling, Electrical stimulation, Spinal mobilization, Cryotherapy, Moist heat, Taping, Ionotophoresis 4mg /ml Dexamethasone, Manual therapy, and Re-evaluation  PLAN FOR NEXT SESSION: Assess response to HEP. Continue R hip strengthening and stabilization exercises as tolerated.    Mirely Pangle Rober Minion, PT 06/10/2023, 8:45 AM

## 2023-06-10 NOTE — Progress Notes (Signed)
Established Patient Office Visit  Subjective   Patient ID: Frances [name], female    DOB: [birth year]  Age: 47 y.o. MRN: [id number]  Chief Complaint  Patient presents with   Follow-up   Diabetes    HPI Patient is a 47 year old female with type 2 diabetes, chronic pain due to RA and DDD, hyperlipidemia who presents to the clinic for 79-month follow-up.  Patient is taking her Ozempic and metformin regularly.  She is compliant with her medications.  She denies any chest pain, palpitations, headaches or vision changes.  She denies any hypoglycemic events.  Her pony did step on her right but a few weeks ago.  She saw Dr. Karie Schwalbe and x-rays were negative for fracture.  She still has some tenderness and swelling of the dorsal midfoot.  She has Norco that has been prescribed but she has not been able to pick it up because insurance is fighting approval.   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   Lumbar spondylosis 02/10/2008   Back pain 10/13/2008   FIBROMYALGIA 02/10/2008   IRON, SERUM, ELEVATED 02/10/2008   Bipolar I disorder (HCC) 09/16/2017   Chronic fatigue syndrome 09/16/2017   MTHFR gene mutation 09/20/2017   New daily persistent headache 09/26/2017   Seronegative rheumatoid arthritis (HCC) 12/09/2017   Borderline personality disorder (HCC) 01/22/2018   Elevated fasting glucose 04/08/2018   Grief reaction 11/28/2019   Labral tear of hip, degenerative, right 01/11/2020   DDD (degenerative disc disease), cervical 03/13/2020   Ingrown right big toenail 03/13/2020   Recurrent major depressive disorder, in partial remission (HCC) 05/24/2020   Generalized anxiety disorder 05/24/2020   Symptomatic mammary hypertrophy 09/04/2020   Bilateral leg edema 11/19/2020   Seasonal allergies 12/25/2020   Type 2 diabetes mellitus without complication, without long-term current use of insulin (HCC) 03/13/2021   Hyperlipidemia LDL goal <70  03/18/2021   Attention deficit hyperactivity disorder (ADHD), 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   Overweight (BMI 25.0-29.9) 06/18/2021   SOB (shortness of breath) on exertion 08/20/2021   ETD (Eustachian tube dysfunction), bilateral 08/23/2021   Chronic right hip pain 01/06/2022   Acute pain of right shoulder 01/06/2022   Proteinuria 03/17/2022   DEGENERATIVE DISC DISEASE, LUMBAR SPINE 04/07/2022   Night sweats 06/18/2022   Microalbuminuria 09/09/2022   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   Crush injury of right foot 05/25/2023   Resolved Ambulatory Problems    Diagnosis Date Noted   FEVER, RECURRENT 04/02/2009   Hypothyroidism 02/10/2008   SINUSITIS- ACUTE-NOS 03/24/2008   URI 01/17/2009   DERMATITIS, ALLERGIC 06/05/2008   ANKLE PAIN, BILATERAL 07/28/2008   FATIGUE 05/05/2008   OPEN WOUND FT NO TOE ALONE WITHOUT MENTION COMP 03/28/2009   DYSPNEA 10/15/2011   Fibromyalgia 09/16/2017   TMJ (temporomandibular joint syndrome) 09/26/2017   Anxiety 01/22/2018   Depression    Bipolar 1 disorder, depressed, severe (HCC) 01/25/2018   Decreased visual acuity 08/30/2018   Right corneal abrasion 08/30/2018   Toenail fungus 09/09/2019   Vasovagal response 09/09/2019   Chronic right-sided low back pain without sciatica 01/11/2020   Macromastia 06/29/2020   Xiphoid pain 08/13/2020   Sternum pain 08/13/2020   Class 1 obesity due to excess calories without serious comorbidity with body mass index (BMI) of 32.0 to 32.9 in adult 08/13/2020   Costochondritis 10/22/2020   Rib pain on right side 10/22/2020   Tachycardia 10/22/2020   Hepatomegaly 10/22/2020   Chest congestion 11/20/2020  Sinus drainage 11/20/2020   Ear popping, bilateral 08/20/2021   Right lateral epicondylitis 12/31/2021   Weak urine stream 03/17/2022   Acute left-sided low back pain without sciatica 04/07/2022   Acute midline low back pain without sciatica 04/07/2022   Irritant contact dermatitis due to  plants, except food 06/18/2022   Ingrown toenail of right foot 09/09/2022   Chronic bilateral low back pain without sciatica 09/25/2022   Past Medical History:  Diagnosis Date   Arthritis    Back pain, chronic    Bipolar 1 disorder (HCC)    DDD (degenerative disc disease), lumbar    Diabetes mellitus without complication (HCC)    IUD 2012     ROS   See HPI.  Objective:     BP 116/77 (BP Location: Right Arm, Patient Position: Sitting, Cuff Size: Normal)   Pulse 99   Ht 5\' 7"  (1.702 m)   Wt 178 lb (80.7 kg)   SpO2 97%   BMI 27.88 kg/m  BP Readings from Last 3 Encounters:  06/10/23 116/77  05/06/23 109/70  03/10/23 127/83   Wt Readings from Last 3 Encounters:  06/10/23 178 lb (80.7 kg)  05/06/23 176 lb 1.3 oz (79.9 kg)  03/10/23 173 lb (78.5 kg)      Physical Exam Constitutional:      Appearance: Normal appearance.  HENT:     Head: Normocephalic.  Cardiovascular:     Rate and Rhythm: Normal rate and regular rhythm.     Pulses: Normal pulses.     Heart sounds: Normal heart sounds.  Pulmonary:     Effort: Pulmonary effort is normal.     Breath sounds: Normal breath sounds.  Musculoskeletal:     Comments: Right dorsal foot tender and slightly swollen over midfoot.   Neurological:     General: No focal deficit present.     Mental Status: She is alert and oriented to person, place, and time.  Psychiatric:        Mood and Affect: Mood normal.      Results for orders placed or performed in visit on 06/10/23  POCT HgB A1C  Result Value Ref Range   Hemoglobin A1C 5.5 4.0 - 5.6 %   HbA1c POC (<> result, manual entry)     HbA1c, POC (prediabetic range)     HbA1c, POC (controlled diabetic range)         Assessment & Plan:  Marland KitchenMarland KitchenJohnda was seen today for follow-up and diabetes.  Diagnoses and all orders for this visit:  Type 2 diabetes mellitus with other specified complication, without long-term current use of insulin (HCC) -     POCT HgB A1C -      metFORMIN (GLUCOPHAGE) 1000 MG tablet; Take 1 tablet (1,000 mg total) by mouth 2 (two) times daily with a meal.  Crushing injury of right foot, subsequent encounter   A1C to goal Continue ozempic and metformin BP to goal On statin Foot and eye exam UTD Vaccines UTD Follow up in 3 months  Continue to do epson water soaks and compression with elevation Wear good supporting but loose fitting shoes  Has norco as needed for chronic pain due to DDD and RA. She has started to use about once a day. Will send to nurse for PA in office to see if these codes can get it approved. Price out of pocket cost.    Return in about 3 months (around 09/10/2023).    Tandy Gaw, PA-C

## 2023-06-12 DIAGNOSIS — M0600 Rheumatoid arthritis without rheumatoid factor, unspecified site: Principal | ICD-10-CM

## 2023-06-12 MED ORDER — ACTEMRA ACTPEN 162 MG/0.9 ML SUBCUTANEOUS PEN INJECTOR
SUBCUTANEOUS | 2 refills | 28 days | Status: CP
Start: 2023-06-12 — End: ?
  Filled 2023-06-15: qty 3.6, 28d supply, fill #0

## 2023-06-12 NOTE — Unmapped (Signed)
[facility] Refill Coordination Note    Tyisha Cressy, DOB: May 24, 1976  Phone: [phone] (home)       All above HIPAA information was verified with patient.         06/11/2023     2:52 PM   Specialty Rx Medication Refill Questionnaire   Which Medications would you like refilled and shipped? Actemra  completely out because I startled and pulled the needle out on accident.   Please list all current allergies: Skelaxin   Have you missed any doses in the last 30 days? No   Have you had any changes to your medication(s) since your last refill? No   How many days remaining of each medication do you have at home? Will have to look when I get home.   If receiving an injectable medication, next injection date is 06/16/2023   Have you experienced any side effects in the last 30 days? No   Please enter the full address (street address, city, state, zip code) where you would like your medication(s) to be delivered to. [address] [place] [address]   Please specify on which day you would like your medication(s) to arrive. Note: if you need your medication(s) within 3 days, please call the pharmacy to schedule your order at [phone]  06/16/2023   Has your insurance changed since your last refill? No   Would you like a pharmacist to call you to discuss your medication(s)? No   Do you require a signature for your package? (Note: if we are billing Medicare Part B or your order contains a controlled substance, we will require a signature) No         Completed refill call assessment today to schedule patient's medication shipment from the [facility] [phone]).  All relevant notes have been reviewed.       Confirmed patient received a Conservation officer, historic buildings and a Surveyor, mining with first shipment. The patient will receive a drug information handout for each medication shipped and additional FDA Medication Guides as required.         REFERRAL TO PHARMACIST     Referral to the pharmacist: Not needed      [facility]     Shipping address confirmed in Epic.     Delivery Scheduled: Yes, Expected medication delivery date: 06/16/23.  However, Rx request for refills was sent to the provider as there are none remaining.     Medication will be delivered via UPS to the prescription address in Epic WAM.    Tobi Bastos, PharmD   [facility] Specialty Pharmacist

## 2023-06-12 NOTE — Unmapped (Signed)
Actemra refill  Last Visit Date: 03/19/2023  Next Visit Date: 07/22/2023    Lab Results   Component Value Date    ALT 89 (H) 01/15/2023    AST 63 (H) 01/15/2023    ALBUMIN 3.8 01/15/2023    CREATININE 0.70 01/15/2023     Lab Results   Component Value Date    WBC 4.8 01/15/2023    HGB 13.5 01/15/2023    HCT 38.7 01/15/2023    PLT 226 01/15/2023     Lab Results   Component Value Date    NEUTROPCT 49.2 01/15/2023    LYMPHOPCT 39.8 01/15/2023    MONOPCT 5.9 01/15/2023    EOSPCT 4.2 01/15/2023    BASOPCT 0.9 01/15/2023

## 2023-06-16 ENCOUNTER — Telehealth: Payer: Self-pay | Admitting: Physician Assistant

## 2023-06-16 ENCOUNTER — Encounter: Payer: Self-pay | Admitting: Physician Assistant

## 2023-06-16 ENCOUNTER — Telehealth: Payer: Self-pay

## 2023-06-16 ENCOUNTER — Other Ambulatory Visit: Payer: Self-pay | Admitting: Physician Assistant

## 2023-06-16 ENCOUNTER — Ambulatory Visit: Payer: Medicaid Other | Admitting: Physician Assistant

## 2023-06-16 VITALS — BP 128/88 | HR 106 | Ht 67.0 in | Wt 180.0 lb

## 2023-06-16 DIAGNOSIS — N3281 Overactive bladder: Secondary | ICD-10-CM

## 2023-06-16 DIAGNOSIS — E1169 Type 2 diabetes mellitus with other specified complication: Secondary | ICD-10-CM

## 2023-06-16 DIAGNOSIS — R350 Frequency of micturition: Secondary | ICD-10-CM | POA: Diagnosis not present

## 2023-06-16 LAB — POCT URINALYSIS DIP (CLINITEK)
Bilirubin, UA: NEGATIVE
Blood, UA: NEGATIVE
Glucose, UA: NEGATIVE mg/dL
Ketones, POC UA: NEGATIVE mg/dL
Leukocytes, UA: NEGATIVE
Nitrite, UA: NEGATIVE
POC PROTEIN,UA: NEGATIVE
Spec Grav, UA: 1.01 (ref 1.010–1.025)
Urobilinogen, UA: 0.2 E.U./dL
pH, UA: 7 (ref 5.0–8.0)

## 2023-06-16 MED ORDER — PHENAZOPYRIDINE HCL 200 MG PO TABS: 200.0000 mg | ORAL_TABLET | Freq: Three times a day (TID) | ORAL | 0 refills | Status: AC

## 2023-06-16 MED ORDER — MIRABEGRON ER 50 MG PO TB24: 50.0000 mg | ORAL_TABLET | Freq: Every day | ORAL | 0 refills | Status: DC

## 2023-06-16 NOTE — Telephone Encounter (Addendum)
Initiated Prior authorization [id number] 50MG   Via: [facility]  [phone] L  Status: pending as of 06/16/23 Reason: Notified Pt via: Mychart   Initiated Prior authorization [id number] (NORCO/VICODIN) 5-325 MG tablet Via: [facility] Case/Key:[id number]  Status: Pending as of 06/16/23 Reason: Notified Pt via: Mychart

## 2023-06-16 NOTE — Patient Instructions (Signed)
Try pyridium for 2 days and let me know about symptoms then try mybetriq for 1 month and then let me know about symptoms.

## 2023-06-16 NOTE — Telephone Encounter (Signed)
Patient needs prior authorization for mirabegron ER (MYRBETRIQ) 50 MG TB24 tablet [[id number]]

## 2023-06-16 NOTE — Progress Notes (Signed)
Acute Office Visit  Subjective:     Patient ID: Frances [name], female    DOB: 06-Sep-1976, 47 y.o.   MRN: [id number]  Chief Complaint  Patient presents with   Trouble Urinating    HPI Patient is in today for urinary frequency and urge to go but not always urinating very much. No real dysuria. No fever, chills, body aches or abdominal pain. Nothing that makes worse or better. No vaginal discharge or itching. Not sexually active. Symptoms noticeable for last 6 months but worse last 2 months. Caffeine does make it worse. Nothing seems to make better that she is aware of.   .. 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   Lumbar spondylosis 02/10/2008   Back pain 10/13/2008   FIBROMYALGIA 02/10/2008   IRON, SERUM, ELEVATED 02/10/2008   Bipolar I disorder (HCC) 09/16/2017   Chronic fatigue syndrome 09/16/2017   MTHFR gene mutation 09/20/2017   New daily persistent headache 09/26/2017   Seronegative rheumatoid arthritis (HCC) 12/09/2017   Borderline personality disorder (HCC) 01/22/2018   Elevated fasting glucose 04/08/2018   Grief reaction 11/28/2019   Labral tear of hip, degenerative, right 01/11/2020   DDD (degenerative disc disease), cervical 03/13/2020   Ingrown right big toenail 03/13/2020   Recurrent major depressive disorder, in partial remission (HCC) 05/24/2020   Generalized anxiety disorder 05/24/2020   Symptomatic mammary hypertrophy 09/04/2020   Bilateral leg edema 11/19/2020   Seasonal allergies 12/25/2020   Type 2 diabetes mellitus without complication, without long-term current use of insulin (HCC) 03/13/2021   Hyperlipidemia LDL goal <70 03/18/2021   Attention deficit hyperactivity disorder (ADHD), 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   Overweight (BMI 25.0-29.9) 06/18/2021   SOB (shortness of breath) on exertion 08/20/2021   ETD (Eustachian tube dysfunction), bilateral 08/23/2021   Chronic right hip  pain 01/06/2022   Acute pain of right shoulder 01/06/2022   Proteinuria 03/17/2022   DEGENERATIVE DISC DISEASE, LUMBAR SPINE 04/07/2022   Night sweats 06/18/2022   Microalbuminuria 09/09/2022   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   Crush injury of right foot 05/25/2023   Chronic pain syndrome 06/10/2023   Urinary frequency 06/16/2023   OAB (overactive bladder) 06/16/2023   Resolved Ambulatory Problems    Diagnosis Date Noted   FEVER, RECURRENT 04/02/2009   Hypothyroidism 02/10/2008   SINUSITIS- ACUTE-NOS 03/24/2008   URI 01/17/2009   DERMATITIS, ALLERGIC 06/05/2008   ANKLE PAIN, BILATERAL 07/28/2008   FATIGUE 05/05/2008   OPEN WOUND FT NO TOE ALONE WITHOUT MENTION COMP 03/28/2009   DYSPNEA 10/15/2011   Fibromyalgia 09/16/2017   TMJ (temporomandibular joint syndrome) 09/26/2017   Anxiety 01/22/2018   Depression    Bipolar 1 disorder, depressed, severe (HCC) 01/25/2018   Decreased visual acuity 08/30/2018   Right corneal abrasion 08/30/2018   Toenail fungus 09/09/2019   Vasovagal response 09/09/2019   Chronic right-sided low back pain without sciatica 01/11/2020   Macromastia 06/29/2020   Xiphoid pain 08/13/2020   Sternum pain 08/13/2020   Class 1 obesity due to excess calories without serious comorbidity with body mass index (BMI) of 32.0 to 32.9 in adult 08/13/2020   Costochondritis 10/22/2020   Rib pain on right side 10/22/2020   Tachycardia 10/22/2020   Hepatomegaly 10/22/2020   Chest congestion 11/20/2020   Sinus drainage 11/20/2020   Ear popping, bilateral 08/20/2021   Right lateral epicondylitis 12/31/2021   Weak urine stream 03/17/2022   Acute left-sided low back pain without sciatica 04/07/2022  Acute midline low back pain without sciatica 04/07/2022   Irritant contact dermatitis due to plants, except food 06/18/2022   Ingrown toenail of right foot 09/09/2022   Chronic bilateral low back pain without sciatica 09/25/2022   Past Medical History:  Diagnosis  Date   Arthritis    Back pain, chronic    Bipolar 1 disorder (HCC)    DDD (degenerative disc disease), lumbar    Diabetes mellitus without complication (HCC)    IUD 2012     ROS  See HPI.     Objective:    BP 128/88 (BP Location: Left Arm, Patient Position: Sitting, Cuff Size: Normal)   Pulse (!) 106   Ht 5\' 7"  (1.702 m)   Wt 180 lb (81.6 kg)   SpO2 96%   BMI 28.19 kg/m  BP Readings from Last 3 Encounters:  06/16/23 128/88  06/10/23 116/77  05/06/23 109/70   Wt Readings from Last 3 Encounters:  06/16/23 180 lb (81.6 kg)  06/10/23 178 lb (80.7 kg)  05/06/23 176 lb 1.3 oz (79.9 kg)   .[name] Results for orders placed or performed in visit on 06/16/23  POCT URINALYSIS DIP (CLINITEK)  Result Value Ref Range   Color, UA yellow yellow   Clarity, UA clear clear   Glucose, UA negative negative mg/dL   Bilirubin, UA negative negative   Ketones, POC UA negative negative mg/dL   Spec Grav, UA 5.409 8.119 - 1.025   Blood, UA negative negative   pH, UA 7.0 5.0 - 8.0   POC PROTEIN,UA negative negative, trace   Urobilinogen, UA 0.2 0.2 or 1.0 E.U./dL   Nitrite, UA Negative Negative   Leukocytes, UA Negative Negative     Physical Exam Constitutional:      Appearance: Normal appearance.  Cardiovascular:     Rate and Rhythm: Normal rate and regular rhythm.  Pulmonary:     Effort: Pulmonary effort is normal.     Breath sounds: Normal breath sounds.  Abdominal:     General: There is no distension.     Palpations: Abdomen is soft.     Tenderness: There is no abdominal tenderness.  Musculoskeletal:     Right lower leg: No edema.     Left lower leg: No edema.  Neurological:     General: No focal deficit present.     Mental Status: She is alert and oriented to person, place, and time.  Psychiatric:        Mood and Affect: Mood normal.          Assessment & Plan:  Marland KitchenMarland KitchenAudreana was seen today for trouble urinating.  Diagnoses and all orders for this visit:  Urinary  frequency -     POCT URINALYSIS DIP (CLINITEK) -     Urine Culture -     phenazopyridine (PYRIDIUM) 200 MG tablet; Take 1 tablet (200 mg total) by mouth 3 (three) times daily for 2 days. -     mirabegron ER (MYRBETRIQ) 50 MG TB24 tablet; Take 1 tablet (50 mg total) by mouth daily.  OAB (overactive bladder) -     phenazopyridine (PYRIDIUM) 200 MG tablet; Take 1 tablet (200 mg total) by mouth 3 (three) times daily for 2 days. -     mirabegron ER (MYRBETRIQ) 50 MG TB24 tablet; Take 1 tablet (50 mg total) by mouth daily.   UA in office negative for blood, leuks, nitrites.  Will send for culture Pyridium for 2 days then start mybetriq Follow up in 4-6 weeks.  Iran Planas, PA-C

## 2023-06-16 NOTE — Telephone Encounter (Signed)
Patient called requesting an update on the increase dose for her Ozempic medication.   

## 2023-06-17 ENCOUNTER — Ambulatory Visit: Payer: Medicaid Other | Admitting: Rehabilitative and Restorative Service Providers"

## 2023-06-17 NOTE — Telephone Encounter (Signed)
Patient called requesting an update on the increase dose for her Ozempic medication.

## 2023-06-18 ENCOUNTER — Ambulatory Visit: Payer: Medicaid Other

## 2023-06-18 DIAGNOSIS — M25551 Pain in right hip: Secondary | ICD-10-CM

## 2023-06-18 DIAGNOSIS — M545 Low back pain, unspecified: Secondary | ICD-10-CM

## 2023-06-18 DIAGNOSIS — R2689 Other abnormalities of gait and mobility: Secondary | ICD-10-CM

## 2023-06-18 DIAGNOSIS — M6281 Muscle weakness (generalized): Secondary | ICD-10-CM | POA: Diagnosis not present

## 2023-06-18 DIAGNOSIS — R293 Abnormal posture: Secondary | ICD-10-CM

## 2023-06-18 DIAGNOSIS — G8929 Other chronic pain: Secondary | ICD-10-CM

## 2023-06-18 NOTE — Therapy (Signed)
OUTPATIENT PHYSICAL THERAPY LOWER EXTREMITY TREATMENT   Patient Name: Frances [name] MRN: [id number] DOB:[birth year], 47 y.o., female Today's Date: 06/18/2023  END OF SESSION:  PT End of Session - 06/18/23 1143     Visit Number 3    Number of Visits 8    Date for PT Re-Evaluation 07/29/23    Authorization Type [place] Medicaid -- initial 3 visit + eval auth requested 06/04/23    Authorization - Visit Number 3    Authorization - Number of Visits 4    PT Start Time 1148    PT Stop Time 1228    PT Time Calculation (min) 40 min    Activity Tolerance Patient tolerated treatment well    Behavior During Therapy WFL for tasks assessed/performed             Past Medical History:  Diagnosis Date   Anxiety    Arthritis    Back pain, chronic    Bipolar 1 disorder (HCC)    Chronic fatigue syndrome    DDD (degenerative disc disease), lumbar    Depression    Diabetes mellitus without complication (HCC)    Fibromyalgia    IUD 2012   Mirena   MTHFR gene mutation    Past Surgical History:  Procedure Laterality Date   COLONOSCOPY WITH PROPOFOL N/A 02/25/2017   Procedure: COLONOSCOPY WITH PROPOFOL;  Surgeon: Willis Modena, MD;  Location: [facility];  Service: Endoscopy;  Laterality: N/A;   left knee lateral release  1993   scar tisue removal  03/2005   left ankle   TONSILLECTOMY  age 49's   Patient Active Problem List   Diagnosis Date Noted   Urinary frequency 06/16/2023   OAB (overactive bladder) 06/16/2023   Chronic pain syndrome 06/10/2023   Crush injury of right foot 05/25/2023   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   Microalbuminuria 09/09/2022   Night sweats 06/18/2022   DEGENERATIVE DISC DISEASE, LUMBAR SPINE 04/07/2022   Proteinuria 03/17/2022   Chronic right hip pain 01/06/2022   Acute pain of right shoulder 01/06/2022   ETD (Eustachian tube dysfunction), bilateral 08/23/2021   SOB (shortness of breath) on exertion 08/20/2021   Overweight (BMI 25.0-29.9)  06/18/2021   Attention deficit hyperactivity disorder (ADHD), 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   Hyperlipidemia LDL goal <70 03/18/2021   Type 2 diabetes mellitus without complication, without long-term current use of insulin (HCC) 03/13/2021   Seasonal allergies 12/25/2020   Bilateral leg edema 11/19/2020   Symptomatic mammary hypertrophy 09/04/2020   Recurrent major depressive disorder, in partial remission (HCC) 05/24/2020   Generalized anxiety disorder 05/24/2020   DDD (degenerative disc disease), cervical 03/13/2020   Ingrown right big toenail 03/13/2020   Labral tear of hip, degenerative, right 01/11/2020   Grief reaction 11/28/2019   Elevated fasting glucose 04/08/2018   Borderline personality disorder (HCC) 01/22/2018   Seronegative rheumatoid arthritis (HCC) 12/09/2017   New daily persistent headache 09/26/2017   MTHFR gene mutation 09/20/2017   Bipolar I disorder (HCC) 09/16/2017   Chronic fatigue syndrome 09/16/2017   Back pain 10/13/2008   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   Lumbar spondylosis 02/10/2008   FIBROMYALGIA 02/10/2008   IRON, SERUM, ELEVATED 02/10/2008    PCP: Jomarie Longs, PA-C  REFERRING PROVIDER: Monica Becton, MD  REFERRING DIAG: M24.159 (ICD-10-CM) - Labral tear of hip, degenerative  THERAPY DIAG:  Muscle weakness (generalized)  Pain in right hip  Abnormal posture  Other abnormalities of gait and mobility  Chronic bilateral low back  pain without sciatica  Rationale for Evaluation and Treatment: Rehabilitation  ONSET DATE: ~1 year ago  SUBJECTIVE:   SUBJECTIVE STATEMENT: Patient reports she has intermittent sharp pain in hip, states the hip has been aching the last few days. Patient states she has not been able to ride her horses the last few days due to a busy schedule.  EVAL: Pt reports she has a R hip labral tear and arthritis. Pt reports she rides horses and when she first gets on/off it will hurt. Pt states  she will get sharp pain. Feels that it happens randomly. Can feel a catching sensation -- sometimes with standing. Pt states left hip has improved. Pt has not been doing any stretches for hips. Pt states R hip has been injected several times.   PERTINENT HISTORY: Arthritis, back pain  PAIN:  Are you having pain? Yes: NPRS scale: 4/10 currently; at worst 9/10 Pain location: R groin Pain description: sharp and brief Aggravating factors: certain movements Relieving factors: Rest  PRECAUTIONS: None  WEIGHT BEARING RESTRICTIONS: No  FALLS:  Has patient fallen in last 6 months? No  LIVING ENVIRONMENT: Lives with: lives alone Lives in: Mobile home Stairs: Yes: External: 1-2 steps; none Has following equipment at home: None  OCCUPATION: Currently not working; horseback riding, coloring, TV  PATIENT GOALS: Lessen the pain; try not to get surgery  NEXT MD VISIT: n/a  OBJECTIVE:   DIAGNOSTIC FINDINGS: Hip MRI 06/15/18 IMPRESSION: 1. Anterior right labral degeneration with a tear.   PATIENT SURVEYS:  FOTO 52; predicted 59  SENSATION: WFL -- N/T in left leg  MUSCLE LENGTH: Hamstrings: WFL Thomas test: Right -5 deg; Left neutral deg  POSTURE: rounded shoulders  PALPATION: TTP around R greater trochanter, glute med, lateral hamstring  LOWER EXTREMITY ROM:  Active ROM Right eval Left eval  Hip flexion    Hip extension    Hip abduction    Hip adduction    Hip internal rotation    Hip external rotation    Knee flexion    Knee extension    Ankle dorsiflexion    Ankle plantarflexion    Ankle inversion    Ankle eversion     (Blank rows = not tested)  LOWER EXTREMITY MMT:  MMT Right eval Left eval  Hip flexion 4 5  Hip extension 3 4-  Hip abduction 4 4+  Hip adduction 3- 3  Hip internal rotation 4 4  Hip external rotation 3+ 4-  Knee flexion 5 4+  Knee extension 5 5  Ankle dorsiflexion    Ankle plantarflexion    Ankle inversion    Ankle eversion      (Blank rows = not tested)  LOWER EXTREMITY SPECIAL TESTS:  Hip special tests: Luisa Hart (FABER) test: positive , Trendelenburg test: positive , Thomas test: negative, Craig's test: negative, and Ely's test: negative; Painful with FADIR  FUNCTIONAL TESTS:  Did not assess  GAIT: Distance walked: 100' Assistive device utilized: None Level of assistance: Complete Independence Comments: Normal reciprocal pattern   [facility] Adult PT Treatment:                                                DATE: 06/18/2023 Therapeutic Exercise: NuStep L5 x Seated hip flexor stretch x30" B Supine: Modified unilateral single leg stretch Double leg stretch (tabletop, ball b/w knees) Modified bicycle  abs Bent knee fall out BTB x10 Ankle ball squeeze + hip abd BTB x10 Bridges --> typewriter bridge Prone:  Heel squeezes 10x5" Bent knee hip ext (small range) x10 B Standing: Lateral lunges with slider x12 B Lateral rocking on rocker board --> neutral balance hold x30"   [facility] Adult PT Treatment:                                                DATE: 06/10/23 Therapeutic Exercise: Nustep L5 x 6 min  Sitting  Hip flexor stretch 30 sec x 3 VC to engage core R/L  Standing  Side hip lunge x 10 R/L foot on wash cloth  Side hip lunge stretch 20 sec x 2 R/L  Prone  Heel squeeze 3 sec x 10 x 2  Bent knee fall outs x 10 x 2  Glut squeeze w/ core engaged 5 sec x 10 x 2 Supine  4 part core 10 sec x 10  Shoulder flexion alt R/L with core engaged x 10  Pallof press blue TB 3 sec x 10 R/L VC to engage core  Self Care: Discussed the importance of consistent exercise     PATIENT EDUCATION:  Education details: Exam findings, POC, initial HEP Person educated: Patient Education method: Explanation, Demonstration, and Handouts Education comprehension: verbalized understanding, returned demonstration, and needs further education  HOME EXERCISE PROGRAM: Access Code: [email or web address] URL:  [email or web address] Date: 06/10/2023 Prepared by: Corlis Leak  Exercises - Hip Flexor Stretch at Edge of Bed  - 1 x daily - 7 x weekly - 2 sets - 30 sec hold - Upright Side Lunge  - 1 x daily - 7 x weekly - 2 sets - 10 reps - Prone Heel Squeeze  - 1 x daily - 7 x weekly - 2 sets - 10 reps - 3 sec hold - Prone Bent Leg Fallout  - 1 x daily - 7 x weekly - 2 sets - 10 reps - Standing Marching  - 1 x daily - 7 x weekly - 2 sets - 10 reps - Seated Hip Flexor Stretch  - 2 x daily - 7 x weekly - 1 sets - 3 reps - 30 sec  hold - Prone Gluteal Sets  - 1 x daily - 7 x weekly - 2 sets - 10 reps - 5 sec  hold - Supine Transversus Abdominis Bracing with Pelvic Floor Contraction  - 2 x daily - 7 x weekly - 1 sets - 10 reps - 10sec  hold - Supine Alternating Shoulder Flexion  - 2 x daily - 7 x weekly - 1-2 sets - 10 reps - 2 sec  hold - Anti-Rotation Lateral Stepping with Press  - 2 x daily - 7 x weekly - 1-2 sets - 10 reps - 2-3 sec  hold  ASSESSMENT:  CLINICAL IMPRESSION: Core stability progressed with pilates-focused exercises; tactile and verbal cues improved TA activation. Postural stability and dynamic balance challenged with rocker board exercises. Patient able to complete all exercises with no exacerbation of pain.  Eval: Patient is a 47 y.o. F who was seen today for physical therapy evaluation and treatment for R hip pain. Pt notes pain is intermittent, sharp and brief primarily in her groin. Assessment significant for R>L hip weakness especially in glutes, adductors and rotators. Pt would benefit from PT to address these issues  for pain free community activity and recreational activities.   OBJECTIVE IMPAIRMENTS: decreased activity tolerance, decreased endurance, decreased mobility, decreased strength, increased fascial restrictions, increased muscle spasms, improper body mechanics, and pain.    GOALS: Goals reviewed with patient? Yes  SHORT TERM GOALS: Target date:  07/01/2023  Pt will be ind with initial HEP Baseline: Goal status: INITIAL  2.  Pt will demo improved hip adduction on R to at least 3+/5 Baseline:  Goal status: INITIAL   LONG TERM GOALS: Target date: 07/29/2023  Pt will be ind with progression and management of HEP Baseline:  Goal status: INITIAL  2.  Pt will report improved pain with horse back riding to </=50% Baseline:  Goal status: INITIAL  3.  Pt will have improved FOTO to >/=62 Baseline:  Goal status: INITIAL  4.  Pt will report worst pain to </=6/10 Baseline:  Goal status: INITIAL  PLAN:  PT FREQUENCY: 1x/week  PT DURATION: 8 weeks  PLANNED INTERVENTIONS: Therapeutic exercises, Therapeutic activity, Neuromuscular re-education, Balance training, Gait training, Patient/Family education, Self Care, Joint mobilization, Stair training, Aquatic Therapy, Dry Needling, Electrical stimulation, Spinal mobilization, Cryotherapy, Moist heat, Taping, Ionotophoresis 4mg /ml Dexamethasone, Manual therapy, and Re-evaluation  PLAN FOR NEXT SESSION: Follow up on HEP compliance. Continue R hip strengthening and stabilization exercises as tolerated.    Sanjuana Mae, PTA 06/18/2023, 12:29 PM

## 2023-06-19 ENCOUNTER — Other Ambulatory Visit: Payer: Self-pay | Admitting: Physician Assistant

## 2023-06-19 LAB — URINE CULTURE
MICRO NUMBER:: 15124858
SPECIMEN QUALITY:: ADEQUATE

## 2023-06-19 LAB — HOUSE ACCOUNT TRACKING

## 2023-06-19 MED ORDER — NITROFURANTOIN MONOHYD MACRO 100 MG PO CAPS: 100.0000 mg | ORAL_CAPSULE | Freq: Two times a day (BID) | ORAL | 0 refills | Status: DC

## 2023-06-19 NOTE — Progress Notes (Signed)
Sent macrobid for 7 days to treat enterococcus found in culture.

## 2023-06-23 ENCOUNTER — Encounter: Payer: Self-pay | Admitting: Physician Assistant

## 2023-06-24 ENCOUNTER — Encounter: Payer: Self-pay | Admitting: Physical Therapy

## 2023-06-24 ENCOUNTER — Ambulatory Visit: Payer: MEDICAID | Attending: Sports Medicine | Admitting: Physical Therapy

## 2023-06-24 DIAGNOSIS — M25551 Pain in right hip: Secondary | ICD-10-CM | POA: Diagnosis present

## 2023-06-24 DIAGNOSIS — M6281 Muscle weakness (generalized): Secondary | ICD-10-CM | POA: Diagnosis present

## 2023-06-24 NOTE — Therapy (Signed)
OUTPATIENT PHYSICAL THERAPY LOWER EXTREMITY TREATMENT   Patient Name: Frances [name] MRN: [id number] DOB:November 26, 1976, 47 y.o., female Today's Date: 06/24/2023  END OF SESSION:  PT End of Session - 06/24/23 1525     Visit Number 4    Number of Visits 8    Date for PT Re-Evaluation 07/29/23    Authorization Type [place] Medicaid -- initial 3 visit + eval auth requested 06/04/23    Authorization - Visit Number 4    Authorization - Number of Visits 4    PT Start Time 1445    PT Stop Time 1525    PT Time Calculation (min) 40 min    Activity Tolerance Patient tolerated treatment well    Behavior During Therapy WFL for tasks assessed/performed              Past Medical History:  Diagnosis Date   Anxiety    Arthritis    Back pain, chronic    Bipolar 1 disorder (HCC)    Chronic fatigue syndrome    DDD (degenerative disc disease), lumbar    Depression    Diabetes mellitus without complication (HCC)    Fibromyalgia    IUD 2012   Mirena   MTHFR gene mutation    Past Surgical History:  Procedure Laterality Date   COLONOSCOPY WITH PROPOFOL N/A 02/25/2017   Procedure: COLONOSCOPY WITH PROPOFOL;  Surgeon: Willis Modena, MD;  Location: [facility];  Service: Endoscopy;  Laterality: N/A;   left knee lateral release  1993   scar tisue removal  03/2005   left ankle   TONSILLECTOMY  age 32's   Patient Active Problem List   Diagnosis Date Noted   Urinary frequency 06/16/2023   OAB (overactive bladder) 06/16/2023   Chronic pain syndrome 06/10/2023   Crush injury of right foot 05/25/2023   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   Microalbuminuria 09/09/2022   Night sweats 06/18/2022   DEGENERATIVE DISC DISEASE, LUMBAR SPINE 04/07/2022   Proteinuria 03/17/2022   Chronic right hip pain 01/06/2022   Acute pain of right shoulder 01/06/2022   ETD (Eustachian tube dysfunction), bilateral 08/23/2021   SOB (shortness of breath) on exertion 08/20/2021   Overweight (BMI 25.0-29.9)  06/18/2021   Attention deficit hyperactivity disorder (ADHD), 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   Hyperlipidemia LDL goal <70 03/18/2021   Type 2 diabetes mellitus without complication, without long-term current use of insulin (HCC) 03/13/2021   Seasonal allergies 12/25/2020   Bilateral leg edema 11/19/2020   Symptomatic mammary hypertrophy 09/04/2020   Recurrent major depressive disorder, in partial remission (HCC) 05/24/2020   Generalized anxiety disorder 05/24/2020   DDD (degenerative disc disease), cervical 03/13/2020   Ingrown right big toenail 03/13/2020   Labral tear of hip, degenerative, right 01/11/2020   Grief reaction 11/28/2019   Elevated fasting glucose 04/08/2018   Borderline personality disorder (HCC) 01/22/2018   Seronegative rheumatoid arthritis (HCC) 12/09/2017   New daily persistent headache 09/26/2017   MTHFR gene mutation 09/20/2017   Bipolar I disorder (HCC) 09/16/2017   Chronic fatigue syndrome 09/16/2017   Back pain 10/13/2008   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   Lumbar spondylosis 02/10/2008   FIBROMYALGIA 02/10/2008   IRON, SERUM, ELEVATED 02/10/2008    PCP: Jomarie Longs, PA-C  REFERRING PROVIDER: Monica Becton, MD  REFERRING DIAG: M24.159 (ICD-10-CM) - Labral tear of hip, degenerative  THERAPY DIAG:  Muscle weakness (generalized)  Pain in right hip  Rationale for Evaluation and Treatment: Rehabilitation  ONSET DATE: ~1 year ago  SUBJECTIVE:   SUBJECTIVE STATEMENT: Patient reports she rode her horse this morning and still has inner thigh pain. She states she feels she is "worse" than when she started PT.  EVAL: Pt reports she has a R hip labral tear and arthritis. Pt reports she rides horses and when she first gets on/off it will hurt. Pt states she will get sharp pain. Feels that it happens randomly. Can feel a catching sensation -- sometimes with standing. Pt states left hip has improved. Pt has not been doing any  stretches for hips. Pt states R hip has been injected several times.   PERTINENT HISTORY: Arthritis, back pain  PAIN:  Are you having pain? Yes: NPRS scale: 5/10 currently; at worst 9/10 Pain location: R groin Pain description: sharp and brief Aggravating factors: certain movements Relieving factors: Rest  PRECAUTIONS: None  WEIGHT BEARING RESTRICTIONS: No  FALLS:  Has patient fallen in last 6 months? No  LIVING ENVIRONMENT: Lives with: lives alone Lives in: Mobile home Stairs: Yes: External: 1-2 steps; none Has following equipment at home: None  OCCUPATION: Currently not working; horseback riding, coloring, TV  PATIENT GOALS: Lessen the pain; try not to get surgery  NEXT MD VISIT: n/a  OBJECTIVE:   DIAGNOSTIC FINDINGS: Hip MRI 06/15/18 IMPRESSION: 1. Anterior right labral degeneration with a tear.   PATIENT SURVEYS:  FOTO 52; predicted 62   MUSCLE LENGTH: Hamstrings: WFL Thomas test: Right -5 deg; Left neutral deg  POSTURE: rounded shoulders  PALPATION: TTP around R greater trochanter, glute med, lateral hamstring   LOWER EXTREMITY MMT:  MMT Right eval Left eval  Hip flexion 4 5  Hip extension 3 4-  Hip abduction 4 4+  Hip adduction 3- 3  Hip internal rotation 4 4  Hip external rotation 3+ 4-  Knee flexion 5 4+  Knee extension 5 5  Ankle dorsiflexion    Ankle plantarflexion    Ankle inversion    Ankle eversion     (Blank rows = not tested)  LOWER EXTREMITY SPECIAL TESTS:  Hip special tests: Luisa Hart (FABER) test: positive , Trendelenburg test: positive , Thomas test: negative, Craig's test: negative, and Ely's test: negative; Painful with FADIR  FUNCTIONAL TESTS:  Did not assess  GAIT: Distance walked: 100' Assistive device utilized: None Level of assistance: Complete Independence Comments: Normal reciprocal pattern [facility] Adult PT Treatment:                                                DATE: 06/24/23 Therapeutic Exercise: Nustep L6 x 5  min Sidestep red TB around feet along counter Standing hip ext with bent knee ball squeeze x 10 bilat Wide squat and hold 5 x 5 sec Happy baby x 30 sec Typewriter bridge x 10 Sidelying clam x 30 Reverse clam x 30 Rocker board laterally static holds 2 x 30 sec, rocker board rocking laterally x 1 min    [facility] Adult PT Treatment:                                                DATE: 06/18/2023 Therapeutic Exercise: NuStep L5 x Seated hip flexor stretch x30" B Supine: Modified unilateral single leg stretch Double leg stretch (tabletop, ball  b/w knees) Modified bicycle abs Bent knee fall out BTB x10 Ankle ball squeeze + hip abd BTB x10 Bridges --> typewriter bridge Prone:  Heel squeezes 10x5" Bent knee hip ext (small range) x10 B Standing: Lateral lunges with slider x12 B Lateral rocking on rocker board --> neutral balance hold x30"   [facility] Adult PT Treatment:                                                DATE: 06/10/23 Therapeutic Exercise: Nustep L5 x 6 min  Sitting  Hip flexor stretch 30 sec x 3 VC to engage core R/L  Standing  Side hip lunge x 10 R/L foot on wash cloth  Side hip lunge stretch 20 sec x 2 R/L  Prone  Heel squeeze 3 sec x 10 x 2  Bent knee fall outs x 10 x 2  Glut squeeze w/ core engaged 5 sec x 10 x 2 Supine  4 part core 10 sec x 10  Shoulder flexion alt R/L with core engaged x 10  Pallof press blue TB 3 sec x 10 R/L VC to engage core  Self Care: Discussed the importance of consistent exercise     PATIENT EDUCATION:  Education details: Exam findings, POC, initial HEP Person educated: Patient Education method: Explanation, Demonstration, and Handouts Education comprehension: verbalized understanding, returned demonstration, and needs further education  HOME EXERCISE PROGRAM: Access Code: [email or web address] URL: [email or web address] Date: 06/24/2023 Prepared by: Reggy Eye  Exercises - Hip Flexor Stretch at [facility] of Bed  - 1 x  daily - 7 x weekly - 2 sets - 30 sec hold - Upright Side Lunge  - 1 x daily - 7 x weekly - 2 sets - 10 reps - Prone Heel Squeeze  - 1 x daily - 7 x weekly - 2 sets - 10 reps - 3 sec hold - Prone Bent Leg Fallout  - 1 x daily - 7 x weekly - 2 sets - 10 reps - Standing Marching  - 1 x daily - 7 x weekly - 2 sets - 10 reps - Seated Hip Flexor Stretch  - 2 x daily - 7 x weekly - 1 sets - 3 reps - 30 sec  hold - Prone Gluteal Sets  - 1 x daily - 7 x weekly - 2 sets - 10 reps - 5 sec  hold - Supine Transversus Abdominis Bracing with Pelvic Floor Contraction  - 2 x daily - 7 x weekly - 1 sets - 10 reps - 10sec  hold - Supine Alternating Shoulder Flexion  - 2 x daily - 7 x weekly - 1-2 sets - 10 reps - 2 sec  hold - Anti-Rotation Lateral Stepping with Press  - 2 x daily - 7 x weekly - 1-2 sets - 10 reps - 2-3 sec  hold - Clamshell  - 1 x daily - 7 x weekly - 3 sets - 10 reps - Sidelying Reverse Clamshell  - 1 x daily - 7 x weekly - 3 sets - 10 reps - Side Stepping with Resistance at Feet  - 1 x daily - 7 x weekly - 3 sets - 10 reps - Hip Extension with Single Leg Support Prone on Table Edge  - 1 x daily - 7 x weekly - 3 sets - 10 reps  ASSESSMENT:  CLINICAL IMPRESSION: Modified  program due to pt c/o being "worse" than when she started. Pt able to complete modified program with no c/o pain during session  Eval: Patient is a 47 y.o. F who was seen today for physical therapy evaluation and treatment for R hip pain. Pt notes pain is intermittent, sharp and brief primarily in her groin. Assessment significant for R>L hip weakness especially in glutes, adductors and rotators. Pt would benefit from PT to address these issues for pain free community activity and recreational activities.   OBJECTIVE IMPAIRMENTS: decreased activity tolerance, decreased endurance, decreased mobility, decreased strength, increased fascial restrictions, increased muscle spasms, improper body mechanics, and pain.    GOALS: Goals  reviewed with patient? Yes  SHORT TERM GOALS: Target date: 07/01/2023  Pt will be ind with initial HEP Baseline: Goal status: INITIAL  2.  Pt will demo improved hip adduction on R to at least 3+/5 Baseline:  Goal status: INITIAL   LONG TERM GOALS: Target date: 07/29/2023  Pt will be ind with progression and management of HEP Baseline:  Goal status: INITIAL  2.  Pt will report improved pain with horse back riding to </=50% Baseline:  Goal status: INITIAL  3.  Pt will have improved FOTO to >/=62 Baseline:  Goal status: INITIAL  4.  Pt will report worst pain to </=6/10 Baseline:  Goal status: INITIAL  PLAN:  PT FREQUENCY: 1x/week  PT DURATION: 8 weeks  PLANNED INTERVENTIONS: Therapeutic exercises, Therapeutic activity, Neuromuscular re-education, Balance training, Gait training, Patient/Family education, Self Care, Joint mobilization, Stair training, Aquatic Therapy, Dry Needling, Electrical stimulation, Spinal mobilization, Cryotherapy, Moist heat, Taping, Ionotophoresis 4mg /ml Dexamethasone, Manual therapy, and Re-evaluation  PLAN FOR NEXT SESSION: assess response to modified program. Continue R hip strengthening and stabilization exercises as tolerated.    Dave Mannes, PT 06/24/2023, 3:26 PM

## 2023-06-26 NOTE — Telephone Encounter (Signed)
HI Frances [name], can you help [place] with inc dose on ozempic.

## 2023-06-27 ENCOUNTER — Encounter: Payer: Self-pay | Admitting: Physician Assistant

## 2023-06-27 DIAGNOSIS — N3281 Overactive bladder: Secondary | ICD-10-CM

## 2023-06-27 DIAGNOSIS — R3911 Hesitancy of micturition: Secondary | ICD-10-CM

## 2023-06-27 DIAGNOSIS — R350 Frequency of micturition: Secondary | ICD-10-CM

## 2023-06-29 MED ORDER — SEMAGLUTIDE (1 MG/DOSE) 4 MG/3ML ~~LOC~~ SOPN: 1.0000 mg | PEN_INJECTOR | SUBCUTANEOUS | 1 refills | Status: DC

## 2023-06-29 NOTE — Telephone Encounter (Signed)
Yes lets go ahead and bump up the Ozempic to 1 mg and then we can stop the metformin.  I really like to simplify her medication regimen so that makes the most sense.  Thank you so much.  I did update the prescription on her chart to 1 mg but was not sure if we need to fax the prescription somewhere.

## 2023-06-30 ENCOUNTER — Ambulatory Visit (INDEPENDENT_AMBULATORY_CARE_PROVIDER_SITE_OTHER): Payer: MEDICAID | Admitting: Psychiatry

## 2023-06-30 ENCOUNTER — Encounter (HOSPITAL_COMMUNITY): Payer: Self-pay | Admitting: Psychiatry

## 2023-06-30 ENCOUNTER — Telehealth: Payer: Self-pay

## 2023-06-30 VITALS — BP 123/86 | HR 117 | Resp 16 | Ht 67.0 in | Wt 176.6 lb

## 2023-06-30 DIAGNOSIS — R3911 Hesitancy of micturition: Secondary | ICD-10-CM | POA: Insufficient documentation

## 2023-06-30 DIAGNOSIS — F319 Bipolar disorder, unspecified: Secondary | ICD-10-CM

## 2023-06-30 DIAGNOSIS — F900 Attention-deficit hyperactivity disorder, predominantly inattentive type: Secondary | ICD-10-CM

## 2023-06-30 DIAGNOSIS — G2401 Drug induced subacute dyskinesia: Secondary | ICD-10-CM | POA: Diagnosis not present

## 2023-06-30 DIAGNOSIS — E1169 Type 2 diabetes mellitus with other specified complication: Secondary | ICD-10-CM

## 2023-06-30 MED ORDER — VALBENAZINE TOSYLATE 40 MG PO CAPS: 40.0000 mg | ORAL_CAPSULE | Freq: Every day | ORAL | 3 refills | Status: DC

## 2023-06-30 MED ORDER — ARIPIPRAZOLE 15 MG PO TABS: 15.0000 mg | ORAL_TABLET | Freq: Every day | ORAL | 3 refills | Status: DC

## 2023-06-30 MED ORDER — BUPROPION HCL ER (XL) 300 MG PO TB24: 300.0000 mg | ORAL_TABLET | Freq: Every day | ORAL | 3 refills | Status: DC

## 2023-06-30 NOTE — Progress Notes (Addendum)
BH MD/PA/NP OP Progress Note            06/30/2023 12:37 PM Frances [name]  MRN:  [id number]  Chief Complaint: "The movements did not get bette with increasing Cogentin"  HPI: 47 year old female seen today for follow up psychiatric evaluation. She has a psychiatric history of bipolar 1, borderline personality disorder, depression, and anxiety. She is currently managed on Abilify 15 mg nightly, Cogentin 0.5 mg twice daily, Wellbutrin 300 we will mg daily, and Cymbalta 90 mg daily (from rheumatologist). Today, patient notes that medications are somewhat effective in managing her symptoms.   Today the patient was well-groomed, pleasant, cooperative, engaged in eye contact, and engaged in conversation. She informed Clinical research associate that her abnormal muscle movements has not improved with Cogentin.  She notes that it at times becomes difficult to hold her phone or utensils.  Patient notes recently she was holding a bowl and spilled milk all over her cats because of her abnormal hand movements.  Provider conducted an aims assessment and patient scored a 5.  Patient has noted to have abnormal movements in her tongue, hands, and toes.    Since her last visit she informed writer that her anxiety depression continues to be well-managed.  Today provider conducted GAD-7 and patient scored a 2, at her last visit she scored a 0.  Provider also conducted PHQ-9 patient scored a 9, at her last visit she scored a 2.  Patient informed Clinical research associate that time she has visual hallucinations.  She notes recently she thought that she saw a mini horse.  She denies auditory hallucinations, mania or paranoia.She reports that she sleeps 10 hours nightly and has an adequate appetite.   Patient informed Clinical research associate that she enjoys riding her horses.  She notes that she rides with a friend.  She is unable to ride today or tomorrow because of the intense heat.   At this time cogentin discontinued. Patient given a sample of Ingrezza 40 mg to  take daily to help manage symptoms of TD.  Patient has not had routine psychiatric labs in over a year.  Today provider ordered lipid panel, last CBC, CMP, LFT, HgbA1c, vitamin D level, vitamin B12 level, and thyroid level.  Patient complains of visual hallucinations however notes that she would like labs assessed prior to Abilify being adjusted. No medication changes made today she will continue all other medication as prescribed. No other concerns noted at this time.       Visit Diagnosis:    ICD-10-CM   1. Tardive dyskinesia  G24.01 valbenazine (INGREZZA) 40 MG capsule    2. Bipolar I disorder (HCC)  F31.9 ARIPiprazole (ABILIFY) 15 MG tablet    buPROPion (WELLBUTRIN XL) 300 MG 24 hr tablet    valbenazine (INGREZZA) 40 MG capsule    CBC w/Diff/Platelet    Comprehensive Metabolic Panel (CMET)    Hepatic function panel    Thyroid Panel With TSH    HgB A1c    Prolactin    Vitamin B12    Vitamin D (25 hydroxy)    Lipid Profile    3. Attention deficit hyperactivity disorder (ADHD), predominantly inattentive type  F90.0 buPROPion (WELLBUTRIN XL) 300 MG 24 hr tablet      Past Psychiatric History: Bipolar, anxiety, and depression Past Medical History:  Past Medical History:  Diagnosis Date   Anxiety    Arthritis    Back pain, chronic    Bipolar 1 disorder (HCC)    Chronic fatigue syndrome  DDD (degenerative disc disease), lumbar    Depression    Diabetes mellitus without complication (HCC)    Fibromyalgia    IUD 2012   Mirena   MTHFR gene mutation     Past Surgical History:  Procedure Laterality Date   COLONOSCOPY WITH PROPOFOL N/A 02/25/2017   Procedure: COLONOSCOPY WITH PROPOFOL;  Surgeon: Willis Modena, MD;  Location: [facility];  Service: Endoscopy;  Laterality: N/A;   left knee lateral release  1993   scar tisue removal  03/2005   left ankle   TONSILLECTOMY  age 75's    Family Psychiatric History: Maternal aunts Bipolar disorder and uncle alcoholic  Family  History:  Family History  Problem Relation Age of Onset   Diabetes Mother    Stroke Mother    Lupus Mother    Hypertension Mother    Congestive Heart Failure Mother    Mental illness Brother        Not clear what his diagnosis is--may be related to previous drug use.   Cancer Maternal Grandmother        colon   Diabetes Maternal Grandfather    Depression Maternal Uncle    Alcohol abuse Maternal Uncle     Social History:  Social History   Socioeconomic History   Marital status: Single    Spouse name: Not on file   Number of children: 0   Years of education: Not on file   Highest education level: Bachelor's degree (e.g., BA, AB, BS)  Occupational History   Occupation: Airline pilot at times  Tobacco Use   Smoking status: Never   Smokeless tobacco: Never  Vaping Use   Vaping Use: Never used  Substance and Sexual Activity   Alcohol use: No    Alcohol/week: 0.0 standard drinks of alcohol   Drug use: No   Sexual activity: Not on file  Other Topics Concern   Not on file  Social History Narrative   Originally from [place], outside of [place].    Moved here permanently 2012.   Intermittently worked on a farm.   Lives with many cats--3 plus fosters cats.    Single, lives alone in a one story home. Rarely drinks caffeine. 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   Social Determinants of Health   Financial Resource Strain: Medium Risk (06/12/2023)   Overall Financial Resource Strain (CARDIA)    Difficulty of Paying Living Expenses: Somewhat hard  Food Insecurity: Food Insecurity Present (06/12/2023)   Hunger Vital Sign    Worried About Running Out of Food in the Last Year: Sometimes true    Ran Out of Food in the Last Year: Never true  Transportation Needs: No Transportation Needs (06/12/2023)   PRAPARE - Administrator, Civil Service (Medical): No    Lack of Transportation (Non-Medical): No  Physical Activity: Unknown (06/12/2023)    Exercise Vital Sign    Days of Exercise per Week: 0 days    Minutes of Exercise per Session: Not on file  Stress: No Stress Concern Present (06/12/2023)   [employer] of Occupational Health - Occupational Stress Questionnaire    Feeling of Stress : Only a little  Social Connections: Socially Isolated (06/12/2023)   Social Connection and Isolation Panel [NHANES]    Frequency of Communication with Friends and Family: Never    Frequency of Social Gatherings with Friends and Family: Twice a week    Attends Religious Services: Never    Production manager of [employer]  or Organizations: Yes    Attends Banker Meetings: More than 4 times per year    Marital Status: Never married    Allergies:  Allergies  Allergen Reactions   Metaxalone Hives    Metabolic Disorder Labs: Lab Results  Component Value Date   HGBA1C 5.5 06/10/2023   MPG 103 04/14/2018   No results found for: "PROLACTIN" Lab Results  Component Value Date   CHOL 100 09/10/2022   TRIG 103 09/10/2022   HDL 40 09/10/2022   CHOLHDL 2.5 09/10/2022   LDLCALC 41 09/10/2022   LDLCALC 89 01/20/2022   Lab Results  Component Value Date   TSH 0.866 11/21/2020   TSH 1.616 01/27/2018    Therapeutic Level Labs: No results found for: "LITHIUM" No results found for: "VALPROATE" No results found for: "CBMZ"  Current Medications: Current Outpatient Medications  Medication Sig Dispense Refill   nitrofurantoin, macrocrystal-monohydrate, (MACROBID) 100 MG capsule Take 1 capsule (100 mg total) by mouth 2 (two) times daily. 14 capsule 0   valbenazine (INGREZZA) 40 MG capsule Take 1 capsule (40 mg total) by mouth daily. 30 capsule 3   albuterol (VENTOLIN HFA) 108 (90 Base) MCG/ACT inhaler Take 2 puffs 15 minutes before exercise and as needed for shortness of breath. 6.7 g 1   ARIPiprazole (ABILIFY) 15 MG tablet Take 1 tablet (15 mg total) by mouth daily. 30 tablet 3   atorvastatin (LIPITOR) 20 MG tablet Take 1 tablet (20 mg  total) by mouth daily. 90 tablet 3   Azelastine HCl 137 MCG/SPRAY SOLN USE 2 SPRAYS ALTERNATE NOSTRILS TWICE DAILY 30 mL 2   baclofen (LIORESAL) 10 MG tablet Take 1 tablet (10 mg total) by mouth 2 (two) times daily as needed for muscle spasms. 180 each 1   blood glucose meter kit and supplies KIT Dispense based on patient and insurance preference. Use up to four times daily as directed. 1 each 0   buPROPion (WELLBUTRIN XL) 300 MG 24 hr tablet Take 1 tablet (300 mg total) by mouth daily. 30 tablet 3   cholecalciferol (VITAMIN D3) 25 MCG (1000 UT) tablet Take 1,000 Units by mouth daily.     diclofenac (VOLTAREN) 75 MG EC tablet Take 1 tablet (75 mg total) by mouth 2 (two) times daily. 60 tablet 0   DULoxetine HCl 30 MG CSDR Take 90 mg by mouth daily.     fluticasone (FLONASE) 50 MCG/ACT nasal spray Place 2 sprays into both nostrils daily. 16 g 0   HYDROcodone-acetaminophen (NORCO/VICODIN) 5-325 MG tablet Take 1 tablet by mouth 2 (two) times daily as needed for moderate pain (back pain). 60 tablet 0   hydroxychloroquine (PLAQUENIL) 200 MG tablet Take 200 mg by mouth 2 (two) times daily. 200mg  twice daily on weekdays and 200mg  once daily on weekends as of 12/03/22 review     levocetirizine (XYZAL) 5 MG tablet TAKE 1 TABLET (5 MG TOTAL) BY MOUTH EVERY EVENING. 90 tablet 1   metFORMIN (GLUCOPHAGE) 1000 MG tablet Take 1 tablet (1,000 mg total) by mouth 2 (two) times daily with a meal. 180 tablet 3   mirabegron ER (MYRBETRIQ) 50 MG TB24 tablet Take 1 tablet (50 mg total) by mouth daily. 90 tablet 0   montelukast (SINGULAIR) 10 MG tablet TAKE 1 Tablet BY MOUTH ONCE EVERY NIGHT AT BEDTIME 90 tablet 0   pregabalin (LYRICA) 200 MG capsule 1 capsule p.o. 2-3 times daily 90 capsule 3   Semaglutide, 1 MG/DOSE, 4 MG/3ML SOPN Inject 1 mg as  directed once a week. 3 mL 1   Tocilizumab (ACTEMRA) 162 MG/0.9ML SOSY Inject 162 mg into the skin once a week.     No current facility-administered medications for this visit.      Musculoskeletal: Strength & Muscle Tone: within normal limits and  telehealth visit Gait & Station: normal, telehealth visit Patient leans: N/A  Psychiatric Specialty Exam: Review of Systems  Blood pressure 123/86, pulse (!) 117, resp. rate 16, height 5\' 7"  (1.702 m), weight 176 lb 9.6 oz (80.1 kg), SpO2 98 %.Body mass index is 27.66 kg/m.  General Appearance: Well Groomed  Eye Contact:  Good  Speech:  Clear and Coherent and Normal Rate  Volume:  Normal  Mood:  Euthymic  Affect:  Congruent  Thought Process:  Coherent, Goal Directed and Linear  Orientation:  Full (Time, Place, and Person)  Thought Content: WDL and Logical   Suicidal Thoughts:  No  Homicidal Thoughts:  No  Memory:  Immediate;   Good Recent;   Good Remote;   Good  Judgement:  Good  Insight:  Good  Psychomotor Activity:  Normal  Concentration:  Concentration: Good and Attention Span: Good  Recall:  Good  Fund of Knowledge: Good  Language: Good  Akathisia:  No  Handed:  Right  AIMS (if indicated): done, 5  Assets:  Communication Skills Desire for Improvement Financial Resources/Insurance Housing Social Support  ADL's:  Intact  Cognition: WNL  Sleep:  Good   Screenings: AIMS    Flowsheet Row Clinical Support from 06/30/2023 in [facility]) [facility]  AIMS Total Score 5 0      AUDIT    Flowsheet Row [facility]  Alcohol Use Disorder Identification Test Final Score (AUDIT) 0      GAD-7    Flowsheet Row Clinical Support from 06/30/2023 in [facility] Office Visit from 06/10/2023 in [facility] at [facility] Office Visit from 05/06/2023 in [facility] Primary Care & Sports Medicine at [facility] Video Visit from 04/15/2023 in [facility] Office Visit from 03/10/2023 in [facility]  Total GAD-7 Score 2 0 0 0 1      PHQ2-9    Flowsheet Row Clinical Support from 06/30/2023 in [facility] Office Visit from 06/16/2023 in [facility] Primary Care & Sports Medicine at [facility] Office Visit from 06/10/2023 in [facility] Primary Care & Sports Medicine at [facility] Counselor from 05/28/2023 in [facility] Office Visit from 05/06/2023 in [facility] Primary Care & Sports Medicine at [facility]  PHQ-2 Total Score 2 2 2 2 2   PHQ-9 Total Score 9 -- 8 4 9       Flowsheet Row ED from 11/08/2022 in [facility] Emergency Department at [facility] ED from 10/19/2022 in [facility] Care at [facility] ED from 07/20/2022 in [facility] at [facility] RISK CATEGORY No Risk No Risk Error: Question 6 not populated        Assessment and Plan: Patient notes that at times she has abnormal finger and leg movements. Provider conducted an [place] assessment and patient scored a 5. At this time cogentin discontinued. Patient given a sample of Ingrezza 40 mg to take daily to help manage symptoms  of TD.  Patient has not had routine psychiatric labs in over a year.  Today provider ordered lipid panel, last CBC, CMP, LFT, HgbA1c, vitamin D level, vitamin B12 level, and thyroid level.  Patient complains of visual hallucinations however notes that she would like labs assessed prior to Abilify being adjusted. No medication changes made today she will continue all other medication as prescribed. Today Cogentin increased from 0.5 mg daily to 0.5 mg twice daily.  She will continue her other medications as prescribed.  1. Bipolar I disorder (HCC)  Continue- ARIPiprazole (ABILIFY) 15 MG tablet; Take 1 tablet (15 mg total) by mouth daily.  Dispense: 30 tablet; Refill:  3 Continue- buPROPion (WELLBUTRIN XL) 300 MG 24 hr tablet; Take 1 tablet (300 mg total) by mouth daily.  Dispense: 30 tablet; Refill: 3 Continue- valbenazine (INGREZZA) 40 MG capsule; Take 1 capsule (40 mg total) by mouth daily.  Dispense: 30 capsule; Refill: 3 - CBC w/Diff/Platelet - Comprehensive Metabolic Panel (CMET) - Hepatic function panel - Thyroid Panel With TSH - HgB A1c - Prolactin - Vitamin B12 - Vitamin D (25 hydroxy) - Lipid Profile  2. Attention deficit hyperactivity disorder (ADHD), predominantly inattentive type  Continue- buPROPion (WELLBUTRIN XL) 300 MG 24 hr tablet; Take 1 tablet (300 mg total) by mouth daily.  Dispense: 30 tablet; Refill: 3  3. Tardive dyskinesia  Start- valbenazine (INGREZZA) 40 MG capsule; Take 1 capsule (40 mg total) by mouth daily.  Dispense: 30 capsule; Refill: 3    Follow up in 2.5 moths Follow up with therapy   Shanna Cisco, NP 06/30/2023, 12:37 PM

## 2023-06-30 NOTE — Progress Notes (Incomplete)
   06/30/2023  Patient ID: Frances [name], female   DOB: 07/28/1976, 47 y.o.   MRN: [id number]  S/O: Patient outreach in regard to clinic routed request that patient was requesting a dose increase on Ozempic  Diabetes Management Plan   -Patient approved for Ozempic PAP and has been taking 0.5mg  weekly along with metformin 1000mg  BID -Patient does not check BG at home regularly, because anytime she has checked it has been <100 -Most recent A1c 5.5 -Patient does not endorse and s/sx of hypo or hyperglycemia  A/P:  Diabetes Management Plan -Diabetes is well controlled -Increasing Ozempic to 1mg  weekly and stopping metformin -Patient endorses now having Medicaid, so I have pended an order to go to [place] pharmacy for PCP to sign -Will contact pharmacy to verify coverage of medication once order is signed.  If covered, will make sure patient PAP does not auto refill.  If PA is required, I will work on this.  Follow-up:  Will track progress of prescription and keep patient/provider informed  Lenna Gilford, PharmD, DPLA

## 2023-06-30 NOTE — Unmapped (Signed)
[facility] Refill Coordination Note    Specialty Medication(s) to be Shipped:   Inflammatory Disorders: Actemra    Other medication(s) to be shipped: No additional medications requested for fill at this time     Alice Howell, DOB: [birth year]  Phone: [phone] (home)       All above HIPAA information was verified with patient.     Was a Nurse, learning disability used for this call? No    Completed refill call assessment today to schedule patient's medication shipment from the [facility] [phone]).  All relevant notes have been reviewed.     Specialty medication(s) and dose(s) confirmed: Regimen is correct and unchanged.   Changes to medications: Brendan reports starting the following medications: Ingrezza(sample)  Changes to insurance: No  New side effects reported not previously addressed with a pharmacist or physician: None reported  Questions for the pharmacist: No    Confirmed patient received a Conservation officer, historic buildings and a Surveyor, mining with first shipment. The patient will receive a drug information handout for each medication shipped and additional FDA Medication Guides as required.       DISEASE/MEDICATION-SPECIFIC INFORMATION        For patients on injectable medications: Patient currently has 2 doses left.  Next injection is scheduled for 07/07/23.    SPECIALTY MEDICATION ADHERENCE     Medication Adherence    Patient reported X missed doses in the last month: 0  Specialty Medication: ACTEMRA ACTPEN 162 mg/0.9 mL Pnij (tocilizumab)              Were doses missed due to medication being on hold? No     ACTEMRA ACTPEN 162 mg/0.9 mL Pnij (tocilizumab): 14 days of medicine on hand       REFERRAL TO PHARMACIST     Referral to the pharmacist: Not needed      [facility]     Shipping address confirmed in Epic.       Delivery Scheduled: Yes, Expected medication delivery date: 07/08/23.     Medication will be delivered via UPS to the prescription address in Epic WAM.    Craige Cotta   [facility] Specialty Technician

## 2023-07-01 ENCOUNTER — Ambulatory Visit: Payer: MEDICAID | Admitting: Physical Therapy

## 2023-07-01 ENCOUNTER — Encounter: Payer: Self-pay | Admitting: Physical Therapy

## 2023-07-01 DIAGNOSIS — M25551 Pain in right hip: Secondary | ICD-10-CM

## 2023-07-01 DIAGNOSIS — M6281 Muscle weakness (generalized): Secondary | ICD-10-CM | POA: Diagnosis not present

## 2023-07-01 MED ORDER — SEMAGLUTIDE (1 MG/DOSE) 4 MG/3ML ~~LOC~~ SOPN
1.0000 mg | PEN_INJECTOR | SUBCUTANEOUS | 0 refills | Status: DC
Start: 2023-07-01 — End: 2023-09-11

## 2023-07-01 NOTE — Therapy (Signed)
OUTPATIENT PHYSICAL THERAPY LOWER EXTREMITY TREATMENT   Patient Name: Frances [name] MRN: [id number] DOB:02-14-1976, 47 y.o., female Today's Date: 07/01/2023  END OF SESSION:  PT End of Session - 07/01/23 1437     Visit Number 5    Number of Visits 8    Date for PT Re-Evaluation 07/29/23    Authorization Type [place] [employer] -- initial 3 visit + eval auth requested 06/04/23 - initial auth requested 07/01/23    PT Start Time 1355    PT Stop Time 1436    PT Time Calculation (min) 41 min    Activity Tolerance Patient tolerated treatment well    Behavior During Therapy WFL for tasks assessed/performed               Past Medical History:  Diagnosis Date   Anxiety    Arthritis    Back pain, chronic    Bipolar 1 disorder (HCC)    Chronic fatigue syndrome    DDD (degenerative disc disease), lumbar    Depression    Diabetes mellitus without complication (HCC)    Fibromyalgia    IUD 2012   Mirena   MTHFR gene mutation    Past Surgical History:  Procedure Laterality Date   COLONOSCOPY WITH PROPOFOL N/A 02/25/2017   Procedure: COLONOSCOPY WITH PROPOFOL;  Surgeon: Willis Modena, MD;  Location: [facility];  Service: Endoscopy;  Laterality: N/A;   left knee lateral release  1993   scar tisue removal  03/2005   left ankle   TONSILLECTOMY  age 8's   Patient Active Problem List   Diagnosis Date Noted   Urinary hesitancy 06/30/2023   Urinary frequency 06/16/2023   OAB (overactive bladder) 06/16/2023   Chronic pain syndrome 06/10/2023   Crush injury of right foot 05/25/2023   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   Microalbuminuria 09/09/2022   Night sweats 06/18/2022   DEGENERATIVE DISC DISEASE, LUMBAR SPINE 04/07/2022   Proteinuria 03/17/2022   Chronic right hip pain 01/06/2022   Acute pain of right shoulder 01/06/2022   ETD (Eustachian tube dysfunction), bilateral 08/23/2021   SOB (shortness of breath) on exertion 08/20/2021   Overweight (BMI 25.0-29.9) 06/18/2021    Attention deficit hyperactivity disorder (ADHD), 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   Hyperlipidemia LDL goal <70 03/18/2021   Type 2 diabetes mellitus without complication, without long-term current use of insulin (HCC) 03/13/2021   Seasonal allergies 12/25/2020   Bilateral leg edema 11/19/2020   Symptomatic mammary hypertrophy 09/04/2020   Recurrent major depressive disorder, in partial remission (HCC) 05/24/2020   Generalized anxiety disorder 05/24/2020   DDD (degenerative disc disease), cervical 03/13/2020   Ingrown right big toenail 03/13/2020   Labral tear of hip, degenerative, right 01/11/2020   Grief reaction 11/28/2019   Elevated fasting glucose 04/08/2018   Borderline personality disorder (HCC) 01/22/2018   Seronegative rheumatoid arthritis (HCC) 12/09/2017   New daily persistent headache 09/26/2017   MTHFR gene mutation 09/20/2017   Bipolar I disorder (HCC) 09/16/2017   Chronic fatigue syndrome 09/16/2017   Back pain 10/13/2008   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   Lumbar spondylosis 02/10/2008   FIBROMYALGIA 02/10/2008   IRON, SERUM, ELEVATED 02/10/2008    PCP: Jomarie Longs, PA-C  REFERRING PROVIDER: Monica Becton, MD  REFERRING DIAG: M24.159 (ICD-10-CM) - Labral tear of hip, degenerative  THERAPY DIAG:  Muscle weakness (generalized)  Pain in right hip  Rationale for Evaluation and Treatment: Rehabilitation  ONSET DATE: ~1 year ago  SUBJECTIVE:   SUBJECTIVE STATEMENT: Pt  states her hip continues to feel "worse" every day. She states she does think her current meds are helping her RA  EVAL: Pt reports she has a R hip labral tear and arthritis. Pt reports she rides horses and when she first gets on/off it will hurt. Pt states she will get sharp pain. Feels that it happens randomly. Can feel a catching sensation -- sometimes with standing. Pt states left hip has improved. Pt has not been doing any stretches for hips. Pt states R hip has  been injected several times.   PERTINENT HISTORY: Arthritis, back pain  PAIN:  Are you having pain? Yes: NPRS scale: 6/10 currently; at worst 9/10 Pain location: R groin Pain description: sharp and brief Aggravating factors: certain movements Relieving factors: Rest  PRECAUTIONS: None  WEIGHT BEARING RESTRICTIONS: No  FALLS:  Has patient fallen in last 6 months? No  LIVING ENVIRONMENT: Lives with: lives alone Lives in: Mobile home Stairs: Yes: External: 1-2 steps; none Has following equipment at home: None  OCCUPATION: Currently not working; horseback riding, coloring, TV  PATIENT GOALS: Lessen the pain; try not to get surgery  NEXT MD VISIT: n/a  OBJECTIVE:   DIAGNOSTIC FINDINGS: Hip MRI 06/15/18 IMPRESSION: 1. Anterior right labral degeneration with a tear.   PATIENT SURVEYS:  FOTO 52; predicted 62   MUSCLE LENGTH: Hamstrings: WFL Thomas test: Right -5 deg; Left neutral deg  POSTURE: rounded shoulders  PALPATION: TTP around R greater trochanter, glute med, lateral hamstring   LOWER EXTREMITY MMT:  MMT Right eval Left eval Right 07/01/23  Hip flexion 4 5 4+  Hip extension 3 4- 3+  Hip abduction 4 4+ 4  Hip adduction 3- 3 3+  Hip internal rotation 4 4 4   Hip external rotation 3+ 4- 4  Knee flexion 5 4+   Knee extension 5 5   Ankle dorsiflexion     Ankle plantarflexion     Ankle inversion     Ankle eversion      (Blank rows = not tested)  LOWER EXTREMITY SPECIAL TESTS:  Hip special tests: Luisa Hart (FABER) test: positive , Trendelenburg test: positive , Thomas test: negative, Craig's test: negative, and Ely's test: negative; Painful with FADIR  FUNCTIONAL TESTS:  Did not assess  GAIT: Distance walked: 100' Assistive device utilized: None Level of assistance: Complete Independence Comments: Normal reciprocal pattern  [facility] Adult PT Treatment:                                                DATE: 07/01/23 Therapeutic Exercise: Nustep L6 x 5  min Wide leg squat 2 x 5 Half kneeling wt shifts for hip stretch and strengthening Resisted walking laterally 10# x 10 bilat Resisted walking backward 15# x 10 Rocker board laterally static hold x 1 min, tapping x 1 min Prone hip ext with knee flexed 2 x 10 Reverse clam ball between knees x 30 Clam ball between ankles x 30   [facility] Adult PT Treatment:                                                DATE: 06/24/23 Therapeutic Exercise: Nustep L6 x 5 min Sidestep red TB around feet along counter Standing hip  ext with bent knee ball squeeze x 10 bilat Wide squat and hold 5 x 5 sec Happy baby x 30 sec Typewriter bridge x 10 Sidelying clam x 30 Reverse clam x 30 Rocker board laterally static holds 2 x 30 sec, rocker board rocking laterally x 1 min    [facility] Adult PT Treatment:                                                DATE: 06/18/2023 Therapeutic Exercise: NuStep L5 x Seated hip flexor stretch x30" B Supine: Modified unilateral single leg stretch Double leg stretch (tabletop, ball b/w knees) Modified bicycle abs Bent knee fall out BTB x10 Ankle ball squeeze + hip abd BTB x10 Bridges --> typewriter bridge Prone:  Heel squeezes 10x5" Bent knee hip ext (small range) x10 B Standing: Lateral lunges with slider x12 B Lateral rocking on rocker board --> neutral balance hold x30"  PATIENT EDUCATION:  Education details: Exam findings, POC, initial HEP Person educated: Patient Education method: Explanation, Demonstration, and Handouts Education comprehension: verbalized understanding, returned demonstration, and needs further education  HOME EXERCISE PROGRAM: Access Code: [email or web address] URL: [email or web address] Date: 06/24/2023 Prepared by: Reggy Eye  Exercises - Hip Flexor Stretch at [facility] of Bed  - 1 x daily - 7 x weekly - 2 sets - 30 sec hold - Upright Side Lunge  - 1 x daily - 7 x weekly - 2 sets - 10 reps - Prone Heel Squeeze  - 1 x daily - 7 x weekly  - 2 sets - 10 reps - 3 sec hold - Prone Bent Leg Fallout  - 1 x daily - 7 x weekly - 2 sets - 10 reps - Standing Marching  - 1 x daily - 7 x weekly - 2 sets - 10 reps - Seated Hip Flexor Stretch  - 2 x daily - 7 x weekly - 1 sets - 3 reps - 30 sec  hold - Prone Gluteal Sets  - 1 x daily - 7 x weekly - 2 sets - 10 reps - 5 sec  hold - Supine Transversus Abdominis Bracing with Pelvic Floor Contraction  - 2 x daily - 7 x weekly - 1 sets - 10 reps - 10sec  hold - Supine Alternating Shoulder Flexion  - 2 x daily - 7 x weekly - 1-2 sets - 10 reps - 2 sec  hold - Anti-Rotation Lateral Stepping with Press  - 2 x daily - 7 x weekly - 1-2 sets - 10 reps - 2-3 sec  hold - Clamshell  - 1 x daily - 7 x weekly - 3 sets - 10 reps - Sidelying Reverse Clamshell  - 1 x daily - 7 x weekly - 3 sets - 10 reps - Side Stepping with Resistance at Feet  - 1 x daily - 7 x weekly - 3 sets - 10 reps - Hip Extension with Single Leg Support Prone on Table Edge  - 1 x daily - 7 x weekly - 3 sets - 10 reps  ASSESSMENT:  CLINICAL IMPRESSION: Pt has improved Rt hip strength and has met STGs. She continues with pain but is progressing well with strength and balance.  Eval: Patient is a 47 y.o. F who was seen today for physical therapy evaluation and treatment for R hip pain. Pt notes pain is intermittent,  sharp and brief primarily in her groin. Assessment significant for R>L hip weakness especially in glutes, adductors and rotators. Pt would benefit from PT to address these issues for pain free community activity and recreational activities.   OBJECTIVE IMPAIRMENTS: decreased activity tolerance, decreased endurance, decreased mobility, decreased strength, increased fascial restrictions, increased muscle spasms, improper body mechanics, and pain.    GOALS: Goals reviewed with patient? Yes  SHORT TERM GOALS: Target date: 07/01/2023  Pt will be ind with initial HEP Baseline: Goal status: MET  2.  Pt will demo improved hip  adduction on R to at least 3+/5 Baseline:  Goal status: MET   LONG TERM GOALS: Target date: 07/29/2023  Pt will be ind with progression and management of HEP Baseline:  Goal status: INITIAL  2.  Pt will report improved pain with horse back riding to </=50% Baseline:  Goal status: INITIAL  3.  Pt will have improved FOTO to >/=62 Baseline:  Goal status: INITIAL  4.  Pt will report worst pain to </=6/10 Baseline:  Goal status: INITIAL  PLAN:  PT FREQUENCY: 1x/week  PT DURATION: 8 weeks  PLANNED INTERVENTIONS: Therapeutic exercises, Therapeutic activity, Neuromuscular re-education, Balance training, Gait training, Patient/Family education, Self Care, Joint mobilization, Stair training, Aquatic Therapy, Dry Needling, Electrical stimulation, Spinal mobilization, Cryotherapy, Moist heat, Taping, Ionotophoresis 4mg /ml Dexamethasone, Manual therapy, and Re-evaluation  PLAN FOR NEXT SESSION: Continue R hip strengthening and stabilization exercises as tolerated.    Rafael Salway, PT 07/01/2023, 2:39 PM

## 2023-07-02 ENCOUNTER — Telehealth: Payer: Self-pay

## 2023-07-02 NOTE — Progress Notes (Signed)
   07/02/2023  Patient ID: Frances [name], female   DOB: 1976-06-24, 47 y.o.   MRN: [id number]  Ozempic 1mg  weekly prescription sent to [facility] yesterday.  Contacted pharmacy today to check on insurance coverage of this dose increase, and the insurance is requiring a prior authorization.  I am completing and submitting a PA request through CoverMyMeds and will inform patient via MyChart message.  Once approved, I will have pharmacy reprocess and will notify [name].  Lenna Gilford, PharmD, DPLA

## 2023-07-03 ENCOUNTER — Ambulatory Visit (INDEPENDENT_AMBULATORY_CARE_PROVIDER_SITE_OTHER): Payer: MEDICAID | Admitting: Family Medicine

## 2023-07-03 ENCOUNTER — Encounter: Payer: Self-pay | Admitting: Family Medicine

## 2023-07-03 VITALS — BP 114/89 | HR 90 | Resp 20 | Ht 67.0 in | Wt 177.8 lb

## 2023-07-03 DIAGNOSIS — M545 Low back pain, unspecified: Secondary | ICD-10-CM | POA: Diagnosis not present

## 2023-07-03 MED ORDER — METHYLPREDNISOLONE 4 MG PO TBPK
ORAL_TABLET | ORAL | 0 refills | Status: DC
Start: 2023-07-03 — End: 2023-09-03

## 2023-07-03 MED ORDER — KETOROLAC TROMETHAMINE 60 MG/2ML IM SOLN
60.0000 mg | Freq: Once | INTRAMUSCULAR | Status: AC
Start: 2023-07-03 — End: 2023-07-03
  Administered 2023-07-03: 60 mg via INTRAMUSCULAR

## 2023-07-03 NOTE — Assessment & Plan Note (Addendum)
Patient has a history of degenerative disc disease and is coming in with back flare.  Will go ahead and do a Toradol 60 mg shot in clinic today advised patient to drink lots of water following injection.  We will then go ahead and do a Medrol Dosepak since patient says that this is usually what helps her.  Encourage patient to do some back stretches and provided her with a list of stretches.  Gave patient red flag symptoms and warnings to watch out for.  She does not have any current red flag symptoms in clinic today.

## 2023-07-03 NOTE — Progress Notes (Signed)
Established patient visit   Patient: Frances [name]   DOB: 1976/08/04   47 y.o. Female  MRN: [id number] Visit Date: 07/03/2023  Today's healthcare provider: Charlton Amor, DO   Chief Complaint  Patient presents with   Back Pain    Pt comes in today with complaints of low back pain dull x1wk has gotten worse    SUBJECTIVE    Chief Complaint  Patient presents with   Back Pain    Pt comes in today with complaints of low back pain dull x1wk has gotten worse   HPI HPI     Back Pain    Additional comments: Pt comes in today with complaints of low back pain dull x1wk has gotten worse      Last edited by Roselyn Reef, CMA on 07/03/2023 10:56 AM.       Pt presents for acute visit today. She is having a back flare up. She has a history of degenerative disc disease.  She was vacuuming earlier this week and woke up with back pain the next day.  She does have Vicodin at home to which she took 1 dose this morning but says she has not gotten any relief which prompted her to make this appointment today. She says her flareups are usually cured with a Toradol injection and steroids.  Review of Systems  Constitutional:  Negative for activity change, fatigue and fever.  Respiratory:  Negative for cough and shortness of breath.   Cardiovascular:  Negative for chest pain.  Gastrointestinal:  Negative for abdominal pain.  Genitourinary:  Negative for difficulty urinating.       Current Meds  Medication Sig   albuterol (VENTOLIN HFA) 108 (90 Base) MCG/ACT inhaler Take 2 puffs 15 minutes before exercise and as needed for shortness of breath.   ARIPiprazole (ABILIFY) 15 MG tablet Take 1 tablet (15 mg total) by mouth daily.   atorvastatin (LIPITOR) 20 MG tablet Take 1 tablet (20 mg total) by mouth daily.   Azelastine HCl 137 MCG/SPRAY SOLN USE 2 SPRAYS ALTERNATE NOSTRILS TWICE DAILY   baclofen (LIORESAL) 10 MG tablet Take 1 tablet (10 mg total) by mouth 2 (two) times daily as  needed for muscle spasms.   blood glucose meter kit and supplies KIT Dispense based on patient and insurance preference. Use up to four times daily as directed.   buPROPion (WELLBUTRIN XL) 300 MG 24 hr tablet Take 1 tablet (300 mg total) by mouth daily.   cholecalciferol (VITAMIN D3) 25 MCG (1000 UT) tablet Take 1,000 Units by mouth daily.   diclofenac (VOLTAREN) 75 MG EC tablet Take 1 tablet (75 mg total) by mouth 2 (two) times daily.   DULoxetine HCl 30 MG CSDR Take 90 mg by mouth daily.   fluticasone (FLONASE) 50 MCG/ACT nasal spray Place 2 sprays into both nostrils daily.   hydroxychloroquine (PLAQUENIL) 200 MG tablet Take 200 mg by mouth 2 (two) times daily. 200mg  twice daily on weekdays and 200mg  once daily on weekends as of 12/03/22 review   levocetirizine (XYZAL) 5 MG tablet TAKE 1 TABLET (5 MG TOTAL) BY MOUTH EVERY EVENING.   metFORMIN (GLUCOPHAGE) 1000 MG tablet Take 1 tablet (1,000 mg total) by mouth 2 (two) times daily with a meal.   methylPREDNISolone (MEDROL DOSEPAK) 4 MG TBPK tablet Follow instructions on pill pack   montelukast (SINGULAIR) 10 MG tablet TAKE 1 Tablet BY MOUTH ONCE EVERY NIGHT AT BEDTIME   pregabalin (LYRICA) 200 MG capsule 1  capsule p.o. 2-3 times daily   Semaglutide, 1 MG/DOSE, 4 MG/3ML SOPN Inject 1 mg as directed once a week.   Tocilizumab (ACTEMRA) 162 MG/0.9ML SOSY Inject 162 mg into the skin once a week.   valbenazine (INGREZZA) 40 MG capsule Take 1 capsule (40 mg total) by mouth daily.   [DISCONTINUED] HYDROcodone-acetaminophen (NORCO/VICODIN) 5-325 MG tablet Take 1 tablet by mouth 2 (two) times daily as needed for moderate pain (back pain).   Current Facility-Administered Medications for the 07/03/23 encounter (Office Visit) with Charlton Amor, DO  Medication   ketorolac (TORADOL) injection 60 mg    OBJECTIVE    BP 114/89 (BP Location: Left Arm, Patient Position: Sitting, Cuff Size: Normal)   Pulse 90   Resp 20   Ht 5\' 7"  (1.702 m)   Wt 177 lb 12  oz (80.6 kg)   SpO2 99%   BMI 27.84 kg/m   Physical Exam Vitals and nursing note reviewed.  Constitutional:      General: She is not in acute distress.    Appearance: Normal appearance.  HENT:     Head: Normocephalic and atraumatic.     Right Ear: External ear normal.     Left Ear: External ear normal.     Nose: Nose normal.  Eyes:     Conjunctiva/sclera: Conjunctivae normal.  Cardiovascular:     Rate and Rhythm: Normal rate and regular rhythm.  Pulmonary:     Effort: Pulmonary effort is normal.     Breath sounds: Normal breath sounds.  Musculoskeletal:     Comments: Tenderness to palpation of paraspinal muscles of the low back.  Neurological:     General: No focal deficit present.     Mental Status: She is alert and oriented to person, place, and time.  Psychiatric:        Mood and Affect: Mood normal.        Behavior: Behavior normal.        Thought Content: Thought content normal.        Judgment: Judgment normal.       ASSESSMENT & PLAN    Problem List Items Addressed This Visit       Other   Back pain - Primary    Patient has a history of degenerative disc disease and is coming in with back flare.  Will go ahead and do a Toradol 60 mg shot in clinic today advised patient to drink lots of water following injection.  We will then go ahead and do a Medrol Dosepak since patient says that this is usually what helps her.  Encourage patient to do some back stretches and provided her with a list of stretches.  Gave patient red flag symptoms and warnings to watch out for.  She does not have any current red flag symptoms in clinic today.      Relevant Medications   ketorolac (TORADOL) injection 60 mg (Start on 07/03/2023 11:15 AM)   methylPREDNISolone (MEDROL DOSEPAK) 4 MG TBPK tablet    Return if symptoms worsen or fail to improve.      Meds ordered this encounter  Medications   ketorolac (TORADOL) injection 60 mg   methylPREDNISolone (MEDROL DOSEPAK) 4 MG TBPK  tablet    Sig: Follow instructions on pill pack    Dispense:  21 tablet    Refill:  0    No orders of the defined types were placed in this encounter.    Charlton Amor, DO   Primary  Care & Sports Medicine at [facility] [phone] (phone) [phone] (fax)  [facility]

## 2023-07-07 MED FILL — ACTEMRA ACTPEN 162 MG/0.9 ML SUBCUTANEOUS PEN INJECTOR: SUBCUTANEOUS | 28 days supply | Qty: 3.6 | Fill #1

## 2023-07-08 ENCOUNTER — Encounter: Payer: Self-pay | Admitting: Physical Therapy

## 2023-07-08 ENCOUNTER — Ambulatory Visit: Payer: MEDICAID | Admitting: Physical Therapy

## 2023-07-08 DIAGNOSIS — M6281 Muscle weakness (generalized): Secondary | ICD-10-CM

## 2023-07-08 DIAGNOSIS — M25551 Pain in right hip: Secondary | ICD-10-CM

## 2023-07-08 NOTE — Therapy (Signed)
OUTPATIENT PHYSICAL THERAPY LOWER EXTREMITY TREATMENT   Patient Name: Frances [name] MRN: [id number] DOB:September 12, 1976, 47 y.o., female Today's Date: 07/08/2023  END OF SESSION:  PT End of Session - 07/08/23 1531     Visit Number 6    Number of Visits 8    Date for PT Re-Evaluation 07/29/23    Authorization Type [place] Medicaid -- initial 3 visit + eval auth requested 06/04/23 - initial auth requested 07/01/23    Authorization - Visit Number 6    PT Start Time 1445    PT Stop Time 1530    PT Time Calculation (min) 45 min    Activity Tolerance Patient tolerated treatment well    Behavior During Therapy WFL for tasks assessed/performed                Past Medical History:  Diagnosis Date   Anxiety    Arthritis    Back pain, chronic    Bipolar 1 disorder (HCC)    Chronic fatigue syndrome    DDD (degenerative disc disease), lumbar    Depression    Diabetes mellitus without complication (HCC)    Fibromyalgia    IUD 2012   Mirena   MTHFR gene mutation    Past Surgical History:  Procedure Laterality Date   COLONOSCOPY WITH PROPOFOL N/A 02/25/2017   Procedure: COLONOSCOPY WITH PROPOFOL;  Surgeon: Willis Modena, MD;  Location: [facility];  Service: Endoscopy;  Laterality: N/A;   left knee lateral release  1993   scar tisue removal  03/2005   left ankle   TONSILLECTOMY  age 44's   Patient Active Problem List   Diagnosis Date Noted   Urinary hesitancy 06/30/2023   Urinary frequency 06/16/2023   OAB (overactive bladder) 06/16/2023   Chronic pain syndrome 06/10/2023   Crush injury of right foot 05/25/2023   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   Microalbuminuria 09/09/2022   Night sweats 06/18/2022   DEGENERATIVE DISC DISEASE, LUMBAR SPINE 04/07/2022   Proteinuria 03/17/2022   Chronic right hip pain 01/06/2022   Acute pain of right shoulder 01/06/2022   ETD (Eustachian tube dysfunction), bilateral 08/23/2021   SOB (shortness of breath) on exertion 08/20/2021    Overweight (BMI 25.0-29.9) 06/18/2021   Attention deficit hyperactivity disorder (ADHD), 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   Hyperlipidemia LDL goal <70 03/18/2021   Type 2 diabetes mellitus without complication, without long-term current use of insulin (HCC) 03/13/2021   Seasonal allergies 12/25/2020   Bilateral leg edema 11/19/2020   Symptomatic mammary hypertrophy 09/04/2020   Recurrent major depressive disorder, in partial remission (HCC) 05/24/2020   Generalized anxiety disorder 05/24/2020   DDD (degenerative disc disease), cervical 03/13/2020   Ingrown right big toenail 03/13/2020   Labral tear of hip, degenerative, right 01/11/2020   Grief reaction 11/28/2019   Elevated fasting glucose 04/08/2018   Borderline personality disorder (HCC) 01/22/2018   Seronegative rheumatoid arthritis (HCC) 12/09/2017   New daily persistent headache 09/26/2017   MTHFR gene mutation 09/20/2017   Bipolar I disorder (HCC) 09/16/2017   Chronic fatigue syndrome 09/16/2017   Back pain 10/13/2008   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   Lumbar spondylosis 02/10/2008   FIBROMYALGIA 02/10/2008   IRON, SERUM, ELEVATED 02/10/2008    PCP: Jomarie Longs, PA-C  REFERRING PROVIDER: Monica Becton, MD  REFERRING DIAG: M24.159 (ICD-10-CM) - Labral tear of hip, degenerative  THERAPY DIAG:  Muscle weakness (generalized)  Pain in right hip  Rationale for Evaluation and Treatment: Rehabilitation  ONSET DATE: ~1  year ago  SUBJECTIVE:   SUBJECTIVE STATEMENT: Pt had an injection last week. She states the injection hurt going in but that she thinks she feels "a little better". She plans to follow up with MD after completing therapy  EVAL: Pt reports she has a R hip labral tear and arthritis. Pt reports she rides horses and when she first gets on/off it will hurt. Pt states she will get sharp pain. Feels that it happens randomly. Can feel a catching sensation -- sometimes with standing. Pt  states left hip has improved. Pt has not been doing any stretches for hips. Pt states R hip has been injected several times.   PERTINENT HISTORY: Arthritis, back pain  PAIN:  Are you having pain? Yes: NPRS scale: 5/10 currently; at worst 9/10 Pain location: R groin Pain description: sharp and brief Aggravating factors: certain movements Relieving factors: Rest  PRECAUTIONS: None  WEIGHT BEARING RESTRICTIONS: No  FALLS:  Has patient fallen in last 6 months? No  LIVING ENVIRONMENT: Lives with: lives alone Lives in: Mobile home Stairs: Yes: External: 1-2 steps; none Has following equipment at home: None  OCCUPATION: Currently not working; horseback riding, coloring, TV  PATIENT GOALS: Lessen the pain; try not to get surgery  NEXT MD VISIT: n/a  OBJECTIVE:   DIAGNOSTIC FINDINGS: Hip MRI 06/15/18 IMPRESSION: 1. Anterior right labral degeneration with a tear.   PATIENT SURVEYS:  FOTO 52; predicted 62   MUSCLE LENGTH: Hamstrings: WFL Thomas test: Right -5 deg; Left neutral deg  POSTURE: rounded shoulders  PALPATION: TTP around R greater trochanter, glute med, lateral hamstring   LOWER EXTREMITY MMT:  MMT Right eval Left eval Right 07/01/23  Hip flexion 4 5 4+  Hip extension 3 4- 3+  Hip abduction 4 4+ 4  Hip adduction 3- 3 3+  Hip internal rotation 4 4 4   Hip external rotation 3+ 4- 4  Knee flexion 5 4+   Knee extension 5 5   Ankle dorsiflexion     Ankle plantarflexion     Ankle inversion     Ankle eversion      (Blank rows = not tested)  LOWER EXTREMITY SPECIAL TESTS:  Hip special tests: Luisa Hart (FABER) test: positive , Trendelenburg test: positive , Thomas test: negative, Craig's test: negative, and Ely's test: negative; Painful with FADIR  FUNCTIONAL TESTS:  Did not assess  GAIT: Distance walked: 100' Assistive device utilized: None Level of assistance: Complete Independence Comments: Normal reciprocal pattern  [facility] Adult PT Treatment:                                                 DATE: 07/08/23 Therapeutic Exercise: Nustep L6 x 5 min for warm up Hip abd on slider 2 x 10 bilat Rocker board laterally static hold x 1 min, tapping x 1 min Eccentric lateral step down 6'' step x 10 bilat Runners step up alt LE 8'' x 10 bilat Prone hip ext with knee flexed 2 x 10 Reverse clam ball between knees x 30 Clam ball between ankles x 30 Wide leg squat x 10 Half kneeling wt shifts for hip mobility Hip arcs red TB x 10 bilat Resisted walking backwards 15# x 10 Resisted walking laterally 10# x 5 bilat    [facility] Adult PT Treatment:  DATE: 07/01/23 Therapeutic Exercise: Nustep L6 x 5 min Wide leg squat 2 x 5 Half kneeling wt shifts for hip stretch and strengthening Resisted walking laterally 10# x 10 bilat Resisted walking backward 15# x 10 Rocker board laterally static hold x 1 min, tapping x 1 min Prone hip ext with knee flexed 2 x 10 Reverse clam ball between knees x 30 Clam ball between ankles x 30   [facility] Adult PT Treatment:                                                DATE: 06/24/23 Therapeutic Exercise: Nustep L6 x 5 min Sidestep red TB around feet along counter Standing hip ext with bent knee ball squeeze x 10 bilat Wide squat and hold 5 x 5 sec Happy baby x 30 sec Typewriter bridge x 10 Sidelying clam x 30 Reverse clam x 30 Rocker board laterally static holds 2 x 30 sec, rocker board rocking laterally x 1 min   PATIENT EDUCATION:  Education details: Exam findings, POC, initial HEP Person educated: Patient Education method: Explanation, Demonstration, and Handouts Education comprehension: verbalized understanding, returned demonstration, and needs further education  HOME EXERCISE PROGRAM: Access Code: [email or web address] URL: [email or web address] Date: 06/24/2023 Prepared by: Reggy Eye  Exercises - Hip Flexor Stretch at [facility] of Bed  - 1 x daily - 7 x weekly  - 2 sets - 30 sec hold - Upright Side Lunge  - 1 x daily - 7 x weekly - 2 sets - 10 reps - Prone Heel Squeeze  - 1 x daily - 7 x weekly - 2 sets - 10 reps - 3 sec hold - Prone Bent Leg Fallout  - 1 x daily - 7 x weekly - 2 sets - 10 reps - Standing Marching  - 1 x daily - 7 x weekly - 2 sets - 10 reps - Seated Hip Flexor Stretch  - 2 x daily - 7 x weekly - 1 sets - 3 reps - 30 sec  hold - Prone Gluteal Sets  - 1 x daily - 7 x weekly - 2 sets - 10 reps - 5 sec  hold - Supine Transversus Abdominis Bracing with Pelvic Floor Contraction  - 2 x daily - 7 x weekly - 1 sets - 10 reps - 10sec  hold - Supine Alternating Shoulder Flexion  - 2 x daily - 7 x weekly - 1-2 sets - 10 reps - 2 sec  hold - Anti-Rotation Lateral Stepping with Press  - 2 x daily - 7 x weekly - 1-2 sets - 10 reps - 2-3 sec  hold - Clamshell  - 1 x daily - 7 x weekly - 3 sets - 10 reps - Sidelying Reverse Clamshell  - 1 x daily - 7 x weekly - 3 sets - 10 reps - Side Stepping with Resistance at Feet  - 1 x daily - 7 x weekly - 3 sets - 10 reps - Hip Extension with Single Leg Support Prone on Table Edge  - 1 x daily - 7 x weekly - 3 sets - 10 reps  ASSESSMENT:  CLINICAL IMPRESSION: Pt continues to improve activity tolerance, hip strength and mobility. She was able to tolerate strengthening on sliders and hip arcs today  Eval: Patient is a 47 y.o. F who was seen today for physical therapy evaluation  and treatment for R hip pain. Pt notes pain is intermittent, sharp and brief primarily in her groin. Assessment significant for R>L hip weakness especially in glutes, adductors and rotators. Pt would benefit from PT to address these issues for pain free community activity and recreational activities.   OBJECTIVE IMPAIRMENTS: decreased activity tolerance, decreased endurance, decreased mobility, decreased strength, increased fascial restrictions, increased muscle spasms, improper body mechanics, and pain.    GOALS: Goals reviewed with  patient? Yes  SHORT TERM GOALS: Target date: 07/01/2023  Pt will be ind with initial HEP Baseline: Goal status: MET  2.  Pt will demo improved hip adduction on R to at least 3+/5 Baseline:  Goal status: MET   LONG TERM GOALS: Target date: 07/29/2023  Pt will be ind with progression and management of HEP Baseline:  Goal status: INITIAL  2.  Pt will report improved pain with horse back riding to </=50% Baseline:  Goal status: INITIAL  3.  Pt will have improved FOTO to >/=62 Baseline:  Goal status: INITIAL  4.  Pt will report worst pain to </=6/10 Baseline:  Goal status: INITIAL  PLAN:  PT FREQUENCY: 1x/week  PT DURATION: 8 weeks  PLANNED INTERVENTIONS: Therapeutic exercises, Therapeutic activity, Neuromuscular re-education, Balance training, Gait training, Patient/Family education, Self Care, Joint mobilization, Stair training, Aquatic Therapy, Dry Needling, Electrical stimulation, Spinal mobilization, Cryotherapy, Moist heat, Taping, Ionotophoresis 4mg /ml Dexamethasone, Manual therapy, and Re-evaluation  PLAN FOR NEXT SESSION: Continue R hip strengthening and stabilization exercises as tolerated.    Elton Heid, PT 07/08/2023, 3:32 PM

## 2023-07-13 ENCOUNTER — Other Ambulatory Visit: Payer: Self-pay | Admitting: Physician Assistant

## 2023-07-13 MED ORDER — MONTELUKAST SODIUM 10 MG PO TABS: 10.0000 mg | ORAL_TABLET | Freq: Every day | ORAL | 3 refills | Status: DC

## 2023-07-13 NOTE — Telephone Encounter (Signed)
Patient requesting singulair be refilled to  [place] instead of [facility]  Singulair 10mg  last written 04/01/2023 Last OV dr. Tamera Punt 07/03/2023 Upcoming appt 09/11/23

## 2023-07-13 NOTE — Telephone Encounter (Signed)
Patient called.  Requesting that Singulair be sent to [place] instead of [facility].

## 2023-07-15 ENCOUNTER — Encounter: Payer: Self-pay | Admitting: Physician Assistant

## 2023-07-15 DIAGNOSIS — N62 Hypertrophy of breast: Secondary | ICD-10-CM

## 2023-07-16 ENCOUNTER — Ambulatory Visit: Payer: MEDICAID | Admitting: Urology

## 2023-07-16 ENCOUNTER — Encounter: Payer: Self-pay | Admitting: Urology

## 2023-07-16 VITALS — BP 122/84 | HR 103 | Ht 67.0 in | Wt 181.0 lb

## 2023-07-16 DIAGNOSIS — N3281 Overactive bladder: Secondary | ICD-10-CM

## 2023-07-16 DIAGNOSIS — R350 Frequency of micturition: Secondary | ICD-10-CM

## 2023-07-16 LAB — MICROSCOPIC EXAMINATION: RBC, Urine: NONE SEEN /hpf (ref 0–2)

## 2023-07-16 LAB — URINALYSIS, ROUTINE W REFLEX MICROSCOPIC
Bilirubin, UA: NEGATIVE
Ketones, UA: NEGATIVE
Leukocytes,UA: NEGATIVE
Nitrite, UA: NEGATIVE
Protein,UA: NEGATIVE
RBC, UA: NEGATIVE
Specific Gravity, UA: >1.03 — ABNORMAL HIGH (ref 1.005–1.030)
Urobilinogen, Ur: 0.2 mg/dL (ref 0.2–1.0)
pH, UA: 5.5 (ref 5.0–7.5)

## 2023-07-16 LAB — BLADDER SCAN AMB NON-IMAGING

## 2023-07-16 MED ORDER — GEMTESA 75 MG PO TABS
75.0000 mg | ORAL_TABLET | Freq: Every day | ORAL | Status: DC
Start: 2023-07-16 — End: 2023-08-13

## 2023-07-16 NOTE — Progress Notes (Signed)
Assessment: 1. Urinary frequency   2. OAB (overactive bladder)     Plan: I personally reviewed the patient's chart including provider notes, and lab results. Diagnosis and management of overactive bladder symptoms discussed with the patient.  Management options including avoidance of dietary irritants, behavioral modification, medical therapy, neuromodulation, and chemodenervation discussed. Bladder diet sheet provided. Trial of Gemtesa 75 mg daily.  Samples provided.  Use and side effects discussed. Return to office in 1 month.   Chief Complaint:  Chief Complaint  Patient presents with   Urinary Frequency    History of Present Illness:  Frances [name] is a 47 y.o. female who is seen in consultation from [place], Lonna Cobb, [place] for evaluation of urinary frequency.  She reports urinary symptoms for approximately 6 months.  She has urinary frequency voiding every 30 minutes, occasional urgency and nocturia x 1.  She does notice some urinary hesitancy and sensation of incomplete emptying.  She has occasional slight dysuria.  No gross hematuria.  No urinary incontinence. She does have problems with constipation.   She has not been on any medical therapy for her symptoms other than Pyridium.  She was given a prescription for Myrbetriq but her insurance would not cover this. No history of recurrent UTIs.  Urine culture from 06/19/2023 grew 10-49 K Enterococcus.  She did not receive any antibiotics for this.   Past Medical History:  Past Medical History:  Diagnosis Date   Anxiety    Arthritis    Back pain, chronic    Bipolar 1 disorder (HCC)    Chronic fatigue syndrome    DDD (degenerative disc disease), lumbar    Depression    Diabetes mellitus without complication (HCC)    Fibromyalgia    IUD 2012   Mirena   MTHFR gene mutation     Past Surgical History:  Past Surgical History:  Procedure Laterality Date   COLONOSCOPY WITH PROPOFOL N/A 02/25/2017   Procedure:  COLONOSCOPY WITH PROPOFOL;  Surgeon: Willis Modena, MD;  Location: [facility];  Service: Endoscopy;  Laterality: N/A;   left knee lateral release  1993   scar tisue removal  03/2005   left ankle   TONSILLECTOMY  age 65's    Allergies:  Allergies  Allergen Reactions   Metaxalone Hives    Family History:  Family History  Problem Relation Age of Onset   Diabetes Mother    Stroke Mother    Lupus Mother    Hypertension Mother    Congestive Heart Failure Mother    Mental illness Brother        Not clear what his diagnosis is--may be related to previous drug use.   Cancer Maternal Grandmother        colon   Diabetes Maternal Grandfather    Depression Maternal Uncle    Alcohol abuse Maternal Uncle     Social History:  Social History   Tobacco Use   Smoking status: Never   Smokeless tobacco: Never  Vaping Use   Vaping status: Never Used  Substance Use Topics   Alcohol use: No    Alcohol/week: 0.0 standard drinks of alcohol   Drug use: No    Review of symptoms:  Constitutional:  Negative for unexplained weight loss, night sweats, fever, chills ENT:  Negative for nose bleeds, sinus pain, painful swallowing CV:  Negative for chest pain, shortness of breath, exercise intolerance, palpitations, loss of consciousness Resp:  Negative for cough, wheezing, shortness of breath GI:  Negative for  nausea, vomiting, diarrhea, bloody stools GU:  Positives noted in HPI; otherwise negative for gross hematuria, dysuria, urinary incontinence Neuro:  Negative for seizures, poor balance, limb weakness, slurred speech Psych:  Negative for lack of energy, depression, anxiety Endocrine:  Negative for polydipsia, polyuria, symptoms of hypoglycemia (dizziness, hunger, sweating) Hematologic:  Negative for anemia, purpura, petechia, prolonged or excessive bleeding, use of anticoagulants  Allergic:  Negative for difficulty breathing or choking as a result of exposure to anything; no shellfish  allergy; no allergic response (rash/itch) to materials, foods  Physical exam: BP 122/84   Pulse (!) 103   Ht 5\' 7"  (1.702 m)   Wt 181 lb (82.1 kg)   BMI 28.35 kg/m  GENERAL APPEARANCE:  Well appearing, well developed, well nourished, NAD HEENT: Atraumatic, Normocephalic, oropharynx clear. NECK: Supple without lymphadenopathy or thyromegaly. LUNGS: Clear to auscultation bilaterally. HEART: Regular Rate and Rhythm without murmurs, gallops, or rubs. ABDOMEN: Soft, non-tender, No Masses. EXTREMITIES: Moves all extremities well.  Without clubbing, cyanosis, or edema. NEUROLOGIC:  Alert and oriented x 3, normal gait, CN II-XII grossly intact.  MENTAL STATUS:  Appropriate. BACK:  Non-tender to palpation.  No CVAT SKIN:  Warm, dry and intact.    Results: U/A: 0-5 WBC, 0 RBC  PVR: 90 ml

## 2023-07-21 NOTE — Addendum Note (Signed)
Addended by: Jomarie Longs on: 07/21/2023 04:33 PM   Modules accepted: Orders

## 2023-07-21 NOTE — Addendum Note (Signed)
Addended by: Chalmers Cater on: 07/21/2023 10:30 AM   Modules accepted: Orders

## 2023-07-22 ENCOUNTER — Ambulatory Visit
Admit: 2023-07-22 | Discharge: 2023-07-23 | Payer: MEDICARE | Attending: Student in an Organized Health Care Education/Training Program | Primary: Student in an Organized Health Care Education/Training Program

## 2023-07-22 DIAGNOSIS — M0600 Rheumatoid arthritis without rheumatoid factor, unspecified site: Principal | ICD-10-CM

## 2023-07-22 LAB — CBC W/ AUTO DIFF
BASOPHILS ABSOLUTE COUNT: 0 10*9/L (ref 0.0–0.1)
BASOPHILS RELATIVE PERCENT: 1.2 %
EOSINOPHILS ABSOLUTE COUNT: 0.1 10*9/L (ref 0.0–0.5)
EOSINOPHILS RELATIVE PERCENT: 1.8 %
HEMATOCRIT: 38.3 % (ref 34.0–44.0)
HEMOGLOBIN: 13 g/dL (ref 11.3–14.9)
LYMPHOCYTES ABSOLUTE COUNT: 1.7 10*9/L (ref 1.1–3.6)
LYMPHOCYTES RELATIVE PERCENT: 47.9 %
MEAN CORPUSCULAR HEMOGLOBIN CONC: 34 g/dL (ref 32.0–36.0)
MEAN CORPUSCULAR HEMOGLOBIN: 32.5 pg — ABNORMAL HIGH (ref 25.9–32.4)
MEAN CORPUSCULAR VOLUME: 95.5 fL (ref 77.6–95.7)
MEAN PLATELET VOLUME: 7.6 fL (ref 6.8–10.7)
MONOCYTES ABSOLUTE COUNT: 0.3 10*9/L (ref 0.3–0.8)
MONOCYTES RELATIVE PERCENT: 9.1 %
NEUTROPHILS ABSOLUTE COUNT: 1.4 10*9/L — ABNORMAL LOW (ref 1.8–7.8)
NEUTROPHILS RELATIVE PERCENT: 40 %
PLATELET COUNT: 169 10*9/L (ref 150–450)
RED BLOOD CELL COUNT: 4.01 10*12/L (ref 3.95–5.13)
RED CELL DISTRIBUTION WIDTH: 13.9 % (ref 12.2–15.2)
WBC ADJUSTED: 3.5 10*9/L — ABNORMAL LOW (ref 3.6–11.2)

## 2023-07-22 LAB — HEPATIC FUNCTION PANEL
ALBUMIN: 3.8 g/dL (ref 3.4–5.0)
ALKALINE PHOSPHATASE: 57 U/L (ref 46–116)
ALT (SGPT): 64 U/L — ABNORMAL HIGH (ref 10–49)
AST (SGOT): 35 U/L — ABNORMAL HIGH (ref <=34)
BILIRUBIN DIRECT: 0.6 mg/dL — ABNORMAL HIGH (ref 0.00–0.30)
BILIRUBIN TOTAL: 1.7 mg/dL — ABNORMAL HIGH (ref 0.3–1.2)
PROTEIN TOTAL: 6 g/dL (ref 5.7–8.2)

## 2023-07-22 LAB — CREATININE
CREATININE: 0.74 mg/dL
EGFR CKD-EPI (2021) FEMALE: >90 mL/min/{1.73_m2} (ref >=60)

## 2023-07-22 NOTE — Unmapped (Unsigned)
Rheumatology Clinic Follow-up Note     Assessment/Plan:    Alice Howell is a 47 y.o.  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 She couldn't tolerate MTX or leflunomide.     At last visit, I repeated her POC [place] and noted ongoing mild MCP/PIP effusions without CDS or other inflammatory features. Discussed that effusions are nonspecific and I was concerned her sympotoms were more OA driven. Nonetheless we agreed on a trial of Acemtra.    Today, she reports significant improvement with Actemra in the hands from a morning stiffness standpoint but has not had resolution/improvement of her end of the day pain. No active synovitis today.    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   - Cont Actemra  - Continue Diclofenac PRN. Advised pt to take with food.   - Last hand XR 02/2022: no new erosions compared to 09/2019  - CBC w/ diff, Cr, hepatic panel     Fibromyalgia with severe depression:   - Cymbalta 90mg  daily and lyrica 75mg  BID (Rx by me)  - Abilify 10mg  (Rx by others)    Return in about 6 months (around 01/22/2024).     Fleeta Emmer, MD  Assistant Professor of Medicine  Department of Medicine/Division of Rheumatology  [facility]    Primary Care Provider: Hedy Jacob, [facility]    HPI:  Alice Howell is a 47 y.o.  female with a PMHx of fibromyalgia, degenerative disc disease, bipolar and/or borderline personality disorder, and homzygous MTHFR mutation who presents today for follow-up of hand arthritis, likely due to osteoarthritis +/- mild seronegative inflammatory arthritis based on ultrasound in August 2021 showing R 3rd MCP osteophytosis and erosions, and prior ultrasound in 2018 w/ MCP effusions.    Today  Doing well today. Says the Actemra has significantly improved her morning stiffness and function. Less swelling in the morning. Tolerating it well without any adverse effects.    Disease History: She first established care in October 2018 for bilateral hand pain. Due to minimal synovitis on exam she has a diagnostic ultrasound in Dec 2018 which showed evidence of active inflammatory arthritis with erosive changes and effusions in the hands and MCPs. She was started on MTX but could not tolerate it due to concern for homozygous MTHFR state. She later tried [place], but had GI symptoms with this. In April 2019 she was switched to Plaquenil 200mg  BID, without minimal response. She was started on Humira in October 2019 and increased to weekly dosing in Feb 2020. Humira was stopped in July 2020 as it had not been effective. She was started on Xeljanz in Jan 2021 when ultrasound showed MCP synovitis/effusion, which was stopped in April 2021 as she had no benefit. 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 Xrays have not shown chondrocalcinosis. She  had a hand [place] in August 2021 with osteophytes and erosions but no signs of inflammation, so she was continued on NSAIDs and Plaquenil was stopped.     Review of Systems:  Positive findings noted above, otherwise a 14 point review of systems was reviewed and negative    Past Medical, Surgical, Family and Social History reviewed and updated per EMR Allergies:  Metaxalone    Medications:     Current Outpatient Medications:     albuterol HFA 90 mcg/actuation inhaler, Take 2 puffs 15 minutes before exercise and as needed for shortness of breath., Disp: , Rfl:     ARIPiprazole (ABILIFY) 10 MG tablet, Take 1 tablet (10 mg total) by mouth daily., Disp: , Rfl:     atorvastatin (LIPITOR) 10 MG tablet, Take 1 tablet (10 mg total) by mouth daily., Disp: , Rfl:     azelastine (ASTELIN) 137 mcg (0.1 %) nasal spray, 1 spray into each nostril two (2) times a day. Use in each nostril as directed, Disp: , Rfl:     baclofen (LIORESAL) 10 MG tablet, Take 1 tablet (10 mg total) by mouth two (2) times a day as needed for muscle spasm., Disp: 60 tablet, Rfl: 0    benztropine (COGENTIN) 0.5 MG tablet, Take 1 tablet (0.5 mg total) by mouth daily with evening meal., Disp: , Rfl:     buPROPion (WELLBUTRIN XL) 300 MG 24 hr tablet, Take 1 tablet (300 mg total) by mouth daily. Take 450 mg daily, Disp: , Rfl:     cholecalciferol, vitamin D3, (VITAMIN D3 ORAL), Take 5,000 Units by mouth daily. , Disp: , Rfl:     diclofenac (VOLTAREN) 75 MG EC tablet, Take 1 tablet (75 mg total) by mouth two (2) times a day., Disp: 180 tablet, Rfl: 3    DULoxetine (CYMBALTA) 30 MG capsule, Take 3 capsules (90 mg total) by mouth daily., Disp: 270 capsule, Rfl: 3    empty container Misc, USE AS DIRECTED, Disp: 1 each, Rfl: 2    fluticasone propionate (FLONASE) 50 mcg/actuation nasal spray, USE 2 SPRAYS IN EACH NOSTRIL EVERY DAY, Disp: , Rfl:     HYDROcodone-acetaminophen (NORCO) 5-325 mg per tablet, Take 1 tablet by mouth every four (4) hours as needed., Disp: , Rfl:     hydroxychloroquine (PLAQUENIL) 200 mg tablet, Take 1 tablet (200 mg total) by mouth Two (2) times a day., Disp: 180 tablet, Rfl: 3    levocetirizine (XYZAL) 5 MG tablet, Take 1 tablet (5 mg total) by mouth every evening., Disp: , Rfl:     montelukast (SINGULAIR) 10 mg tablet, Take 1 tablet (10 mg total) by mouth nightly., Disp: , Rfl: pregabalin (LYRICA) 100 MG capsule, Take 1 capsule (100 mg total) by mouth two (2) times a day., Disp: , Rfl:     semaglutide (OZEMPIC) 0.25 mg or 0.5 mg(2 mg/1.5 mL) PnIj injection, Inject 0.25 mg under the skin every seven (7) days., Disp: , Rfl:     tocilizumab (ACTEMRA ACTPEN) 162 mg/0.9 mL PnIj, Inject the contents of 1 pen (162 mg total) under the skin every seven (7) days., Disp: 3.6 mL, Rfl: 2    baclofen (LIORESAL) 10 MG tablet, Take 1 tablet (10 mg total) by mouth two (2) times a day as needed for muscle spasms., Disp: 60 tablet, Rfl: 0    metFORMIN (GLUCOPHAGE) 1000 MG tablet, Take 1 tablet (1,000 mg total) by mouth in the morning and 1 tablet (1,000 mg total) in the evening. Take with  meals. (Patient not taking: Reported on 07/22/2023), Disp: , Rfl:       Objective   Vitals:    07/22/23 1047   BP: 103/80   Pulse: 100   Temp: 36.3 ??C (97.3 ??F)   TempSrc: Temporal   Weight: 83 kg (183 lb)             Physical Exam  General: well appearing, no acute distress  HEENT: EOMI, normal conjunctivae, MMM, no oral ulcers.  Cardiovascular: Radial pulses 2+ and symmetric, regular.  Pulmonary: Nl WOB on RA, CTAB.  Skin: No rash, lesions, breakdown. No purpura or petechiae. No digital ulcers.   Extremities: Warm and well perfused, no cyanosis, clubbing or edema  Musculoskeletal: Mild TTP predominately over trapezius and paraspinal muscles, some epicondyles and greater trochanters. No synovitis or focal tenderness to palpation in the hands, wrists, elbows, shoulders, knees, ankles, or feet. Full ROM throughout.   Neurologic: Cranial nerves grossly intact, strength 5/5 throughout.  Normal sensation  Psychiatric: Normal mood and affect.    I personally spent 34 minutes face-to-face and non-face-to-face in the care of this patient, which includes all pre, intra, and post visit time on the date of service.  All documented time was specific to the E/M visit and does not include any procedures that may have been performed. service.  All documented time was specific to the E/M visit and does not include any procedures that may have been performed.

## 2023-07-24 ENCOUNTER — Other Ambulatory Visit: Payer: Self-pay | Admitting: Physician Assistant

## 2023-07-24 DIAGNOSIS — R0989 Other specified symptoms and signs involving the circulatory and respiratory systems: Secondary | ICD-10-CM

## 2023-07-24 DIAGNOSIS — J302 Other seasonal allergic rhinitis: Secondary | ICD-10-CM

## 2023-07-24 DIAGNOSIS — J3489 Other specified disorders of nose and nasal sinuses: Secondary | ICD-10-CM

## 2023-07-27 ENCOUNTER — Ambulatory Visit (HOSPITAL_COMMUNITY): Payer: MEDICAID | Admitting: Mental Health

## 2023-07-27 DIAGNOSIS — F319 Bipolar disorder, unspecified: Secondary | ICD-10-CM | POA: Diagnosis not present

## 2023-07-27 DIAGNOSIS — F900 Attention-deficit hyperactivity disorder, predominantly inattentive type: Secondary | ICD-10-CM

## 2023-07-27 NOTE — Progress Notes (Signed)
Comprehensive Clinical Assessment (CCA) Note Virtual Visit via Video Note  I connected with Frances [name] on 07/27/23 at  9:00 AM EDT by a video enabled telemedicine application and verified that I am speaking with the correct person using two identifiers.  Location: Patient: home address on file Provider: home office   I discussed the limitations of evaluation and management by telemedicine and the availability of in person appointments. The patient expressed understanding and agreed to proceed.  I discussed the assessment and treatment plan with the patient. The patient was provided an opportunity to ask questions and all were answered. The patient agreed with the plan and demonstrated an understanding of the instructions.   The patient was advised to call back or seek an in-person evaluation if the symptoms worsen or if the condition fails to improve as anticipated.  I provided 50 minutes of non-face-to-face time during this encounter.   Stephan Minister Ruston, [facility]   07/27/2023 Frances [name] [id number]  Chief Complaint:  Chief Complaint  Patient presents with   Establish Care   Visit Diagnosis: Bipolar disorder, GAD, Borderline personality    CCA Screening, Triage and Referral (STR)  Patient Reported Information How did you hear about [place]? Self  Referral name: Self referral  Whom do you see for routine medical problems? Primary Care  What Is the Reason for Your Visit/Call Today? "I was triggered on TV one day, how can this happen. I have not been really able to get into my feelings. It is almost like I don't have feelings anymore."  How Long Has This Been Causing You Problems? > than 6 months  What Do You Feel Would Help You the Most Today? Treatment for Depression or other mood problem  Have You Recently Been in Any Inpatient Treatment (Hospital/Detox/Crisis Center/28-Day Program)? No  Have You Ever Received Services From [employer] Before? Yes  Who Do  You See at [facility]? Burtis Junes- Medication management   Have You Recently Had Any Thoughts About Hurting Yourself? No  Are You Planning to Commit Suicide/Harm Yourself At This time? No   Have you Recently Had Thoughts About Hurting Someone Karolee Ohs? No  Have You Used Any Alcohol or Drugs in the Past 24 Hours? No  What Did You Use and How Much? NA   Do You Currently Have a Therapist/Psychiatrist? Yes  Name of Therapist/Psychiatrist: [facility]   Have You Been Recently Discharged From Any Office Practice or Programs? No    CCA Screening Triage Referral Assessment Type of Contact: Tele-Assessment  Is this Initial or Reassessment? Initial Assessment  Collateral Involvement: None  Is CPS involved or ever been involved? Never  Is APS involved or ever been involved? Never  Patient Determined To Be At Risk for Harm To Self or Others Based on Review of Patient Reported Information or Presenting Complaint? No  Method: No Plan  Availability of Means: No access or NA  Intent: Vague intent or NA  Notification Required: No need or identified person  Are There Guns or Other Weapons in Your Home? No  Types of Guns/Weapons: NA  Who Could Verify You Are Able To Have These Secured: NA  Do You Have any Outstanding Charges, Pending Court Dates, Parole/Probation? Denies  Location of Assessment: Other (comment) (home address on file)  Does Patient Present under Involuntary Commitment? No  [place] of Residence: [place]  Patient Currently Receiving the Following Services: Medication Management  Determination of Need: Routine (7 days)  Options For  Referral: Outpatient Therapy; Medication Management     CCA Biopsychosocial Intake/Chief Complaint:  "I was triggered on TV one day, I thought how can this happen. I have not been really able to get into my feelings. It is almost like I don't have feelings anymore. Bipolar, personality disorder. It is a lot better than  it used to(MH)." Frances [name] is a 47 year old single Caucasian female who presents for routine tele-assessment to engage in outpatient therapy services, self referred. Hx of previous outpatient therapy for over a year with [facility] OP and reports to have ceased and would like to re-engage. Notes long history of outpatient therapy and shares has been hospitalized for mental health in the past. Recieves medication managment services, followed by BDoyne Keel at [facility] OP; reports to be medication complaint and denies current medication complaints. Shares history of mental health concerns in high school and shares for sxs to have increased during college with feelings of depression. Notes hx of being diagnosed with Bipolar disorder and a personality disorder, chart reports bipolar disorder, borderline personality disorder, generalized anxiety and ADHD. Shares current to feel as if sxs are laregly managed. Reports current difficulty talking about her father, sharing to not feel like as if she had a father growing up. Reports current stressors to include finances, attempting to get SSI benefits and working to obtain a new Clinical research associate. Notes largely feeling numb and emotionally blunted.  Current Symptoms/Problems: feelings of numbness, increased sleep, pain   Patient Reported Schizophrenia/Schizoaffective Diagnosis in Past: No   Strengths: "helpful to other people."  Preferences: denies  Abilities: "training horses."   Type of Services Patient Feels are Needed: OPT   Initial Clinical Notes/Concerns: Hx of bipolar disorder and borderline personality.   Mental Health Symptoms Depression:   Worthlessness; Increase/decrease in appetite; Sleep (too much or little) (hx of suicidal thoughts, hx of hopeless, decreased appetite, anhedonia. Hx of self-harm behaviors- 5 years last episode)   Duration of Depressive symptoms:  Greater than two weeks   Mania:   Increased Energy; Racing thoughts   Anxiety:    Worrying;  Fatigue   Psychosis:   Hallucinations (shares can see things out the corner of her eye- daily occurrence.)   Duration of Psychotic symptoms:  Less than six months   Trauma:   None   Obsessions:   None   Compulsions:   None   Inattention:   Disorganized; Loses things; Forgetful; Avoids/dislikes activities that require focus (Hx of ADHD dx. x 6 months ago)   Hyperactivity/Impulsivity:   None   Oppositional/Defiant Behaviors:   None   Emotional Irregularity:   Chronic feelings of emptiness; Frantic efforts to avoid abandonment; Intense/unstable relationships   Other Mood/Personality Symptoms:  No data recorded   Mental Status Exam Appearance and self-care  Stature:   Average   Weight:   Average weight   Clothing:   Casual   Grooming:   Well-groomed   Cosmetic use:   None   Posture/gait:   Normal   Motor activity:   Not Remarkable   Sensorium  Attention:   Normal   Concentration:   Normal   Orientation:   X5   Recall/memory:   Normal   Affect and Mood  Affect:   Blunted; Flat   Mood:   Dysphoric   Relating  Eye contact:   Normal   Facial expression:   Sad   Attitude toward examiner:   Cooperative   Thought and Language  Speech flow:  Clear and Coherent  Thought content:   Appropriate to Mood and Circumstances   Preoccupation:   None   Hallucinations:   None   Organization:  No data recorded  [employer] of Knowledge:   Good   Intelligence:   Average   Abstraction:   Normal   Judgement:   Fair   Reality Testing:   Realistic   Insight:   Fair   Decision Making:   Paralyzed; Vacilates   Social Functioning  Social Maturity:   Isolates   Social Judgement:   Normal   Stress  Stressors:   Surveyor, quantity; Housing   Coping Ability:   Overwhelmed; Exhausted   Skill Deficits:   Self-care; Interpersonal; Activities of daily living   Supports:   Friends/Service system      Religion: Religion/Spirituality Are You A Religious Person?: No  Leisure/Recreation: Leisure / Recreation Do You Have Hobbies?: Yes Leisure and Hobbies: Riding horses  Exercise/Diet: Exercise/Diet Do You Exercise?: Yes What Type of Exercise Do You Do?: Other (Comment) How Many Times a Week Do You Exercise?: 1-3 times a week Have You Gained or Lost A Significant Amount of Weight in the Past Six Months?: Yes-Gained Number of Pounds Gained: 15 Do You Follow a Special Diet?: No Do You Have Any Trouble Sleeping?: Yes Explanation of Sleeping Difficulties: increased sleep   CCA Employment/Education Employment/Work Situation: Employment / Work Situation Employment Situation: Unemployed (Attempting to apply for SSI; shares has been denied disability. 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 "zero to 100 in a second." Difficulty getting along with others. 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 Has been in [place] for the past 2000. Describes her childhood as "stressful." Shares to have not had a relationship with father. Shares would only see father for Christmas and birthdays. Shares mother was there for them. Description of patient's relationship with caregiver when they were a child: Mother: good relationoship. Father: distant Patient's description of current relationship with people who raised him/her: Mother: deceased. Father: no contact. How were you disciplined when you got in trouble as a child/adolescent?: - Does patient have siblings?: Yes Number of Siblings: 2 (x 1 brother; x 1 sister.) Description of patient's current relationship with siblings: Shares to hae x 2 siblings. She is the youngest. Shella Maxim has not spoken to her brother in several years. Shares to speak to sister sometimes through [employer]. Did patient suffer  any verbal/emotional/physical/sexual abuse as a child?: Yes (Shares was teased by family.) Did patient suffer from severe childhood neglect?: No Has patient ever been sexually abused/assaulted/raped as an adolescent or adult?: No Was the patient ever a victim of a crime or a disaster?: No Witnessed domestic violence?: No Has patient been affected by domestic violence as an adult?: No  Child/Adolescent Assessment:     CCA Substance Use Alcohol/Drug Use: Alcohol / Drug Use Prescriptions: See MAR History of alcohol / drug use?: No history of alcohol / drug abuse                         ASAM's:  Six Dimensions of Multidimensional Assessment  Dimension 1:  Acute Intoxication and/or Withdrawal Potential:      Dimension 2:  Biomedical Conditions and Complications:      Dimension 3:  Emotional, Behavioral, or Cognitive Conditions and  Complications:     Dimension 4:  Readiness to Change:     Dimension 5:  Relapse, Continued use, or Continued Problem Potential:     Dimension 6:  Recovery/Living Environment:     ASAM Severity Score:    ASAM Recommended Level of Treatment:     Substance use Disorder (SUD)    Recommendations for Services/Supports/Treatments: Recommendations for Services/Supports/Treatments Recommendations For Services/Supports/Treatments: Individual Therapy, Medication Management  DSM5 Diagnoses: Patient Active Problem List   Diagnosis Date Noted   Urinary hesitancy 06/30/2023   Urinary frequency 06/16/2023   OAB (overactive bladder) 06/16/2023   Chronic pain syndrome 06/10/2023   Crush injury of right foot 05/25/2023   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   Microalbuminuria 09/09/2022   Night sweats 06/18/2022   DEGENERATIVE DISC DISEASE, LUMBAR SPINE 04/07/2022   Proteinuria 03/17/2022   Chronic right hip pain 01/06/2022   Acute pain of right shoulder 01/06/2022   ETD (Eustachian tube dysfunction), bilateral 08/23/2021   SOB (shortness of breath) on exertion 08/20/2021   Overweight (BMI 25.0-29.9) 06/18/2021   Attention deficit hyperactivity disorder (ADHD), 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   Hyperlipidemia LDL goal <70 03/18/2021   Type 2 diabetes mellitus without complication, without long-term current use of insulin (HCC) 03/13/2021   Seasonal allergies 12/25/2020   Bilateral leg edema 11/19/2020   Symptomatic mammary hypertrophy 09/04/2020   Recurrent major depressive disorder, in partial remission (HCC) 05/24/2020   Generalized anxiety disorder 05/24/2020   DDD (degenerative disc disease), cervical 03/13/2020   Ingrown right big toenail 03/13/2020   Labral tear of hip, degenerative, right 01/11/2020   Grief reaction 11/28/2019   Elevated fasting glucose 04/08/2018   Borderline personality disorder (HCC) 01/22/2018   Seronegative rheumatoid arthritis (HCC) 12/09/2017    New daily persistent headache 09/26/2017   MTHFR gene mutation 09/20/2017   Bipolar I disorder (HCC) 09/16/2017   Chronic fatigue syndrome 09/16/2017   Back pain 10/13/2008   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 Hx of previous outpatient therapy for over a year with [facility] and reports to have ceased and would like to re-engage. Notes long history of outpatient therapy and shares has been hospitalized for mental health in the past. Recieves medication managment services, followed by BDoyne Keel at [facility]; reports to be medication complaint and denies current medication complaints. Shares history of  mental health concerns in high school and shares for sxs to have increased during college with feelings of depression. Notes hx of being diagnosed with Bipolar disorder and a personality disorder, chart reports bipolar disorder, borderline personality disorder, generalized anxiety and ADHD. Shares current to feel as if sxs are laregly managed. Reports current difficulty talking about her father, sharing to not feel like as if she had a father growing up. Reports current stressors to include finances, attempting to get SSI benefits and working to obtain a new Clinical research associate. Notes largely feeling numb and emotionally blunted.   Frances [name], who prefers to go by [place], presents for tele-assessment alert and oriented; mood and affect blunted neutral. Speech clear and coherent at normal rate and tone. Engaged and cooperative to assessment. Good eye-contact; neutral demeanor. Frances [name] shares history of therapy services and currently engaged in medication management services. Reports for moods to be well monitored with medications. Shares history of depression with feelings of  worthlessness, anhedonia, low mood, decreased appetite, increased sleep. Shares history of suicidal thoughts but deneis hx of attempts. Notes hx of self-harm behaviors x 5 years ago. Shares periods of elevated moods, hx of increased irritability/anger with racing thoughts. Notes periods of visual hallucinations out corner of eye. Shares sxs of anxiety with occasional excessive worrying; denies history of panic attacks. Denies trauma events or experiences. Notes diagnoses of ADHD in adult hood with poor focus, forgetful and disorganized. Shares historical dx of borderline personality disorder and shares history of self-harm behaviors, feelings of emptiness with abandonment concerns. Shares instability in relationships. Currently not working; has not worked since 2009. Currently attempted to obtain SSI. Lives alone and reports minimal natural supports. Denies use of substances. No legal concerns reported. CSSRS, pain, nutrition, GAD and PHQ completed.   PHQ: 10 GAD: 4  Referrals to Alternative Service(s): Referred to Alternative Service(s):   Place:   Date:   Time:    Referred to Alternative Service(s):   Place:   Date:   Time:    Referred to Alternative Service(s):   Place:   Date:   Time:    Referred to Alternative Service(s):   Place:   Date:   Time:      Collaboration of Care: Other None  Patient/Guardian was advised Release of Information must be obtained prior to any record release in order to collaborate their care with an outside provider. Patient/Guardian was advised if they have not already done so to contact the registration department to sign all necessary forms in order for [place] to release information regarding their care.   Consent: Patient/Guardian gives verbal consent for treatment and assignment of benefits for services provided during this visit. Patient/Guardian expressed understanding and agreed to proceed.   Dorris Singh, [facility]

## 2023-08-03 ENCOUNTER — Ambulatory Visit: Payer: MEDICAID | Admitting: Sports Medicine

## 2023-08-03 ENCOUNTER — Encounter: Payer: Self-pay | Admitting: Sports Medicine

## 2023-08-03 ENCOUNTER — Other Ambulatory Visit (INDEPENDENT_AMBULATORY_CARE_PROVIDER_SITE_OTHER): Payer: MEDICAID

## 2023-08-03 DIAGNOSIS — S73191S Other sprain of right hip, sequela: Secondary | ICD-10-CM

## 2023-08-03 DIAGNOSIS — M24159 Other articular cartilage disorders, unspecified hip: Secondary | ICD-10-CM | POA: Diagnosis not present

## 2023-08-03 DIAGNOSIS — M24151 Other articular cartilage disorders, right hip: Secondary | ICD-10-CM | POA: Diagnosis not present

## 2023-08-03 NOTE — Assessment & Plan Note (Signed)
This is a pleasant 47 year old female, she has a diagnosed labral tear from hip MR arthrography performed several years ago. She has done well with steroid injections intermittently, 1 in November 2022, another 22 July 2022, she is having recurrence of pain, we did another 1, as they are lasting about a year I think we can hold off on any recommendation for surgical intervention for now but if she gets a recurrence or if injections do not last at least 3 to 6 months we will get Dr. Steward Drone to weigh in.

## 2023-08-03 NOTE — Progress Notes (Addendum)
    Procedures performed today:    Procedure: Real-time Ultrasound Guided injection of the right hip joint Device: Samsung HS60  Verbal informed consent obtained.  Time-out conducted.  Noted no overlying erythema, induration, or other signs of local infection.  Skin prepped in a sterile fashion.  Local anesthesia: Topical Ethyl chloride.  With sterile technique and under real time ultrasound guidance: Noted hip joint effusion, 1 cc Kenalog 40, 2 cc lidocaine, 2 cc bupivacaine injected easily Completed without difficulty  Advised to call if fevers/chills, erythema, induration, drainage, or persistent bleeding.  Images permanently stored and available for review in PACS.  Impression: Technically successful ultrasound guided injection.  Independent interpretation of notes and tests performed by another provider:   None.  Brief History, Exam, Impression, and Recommendations:    Labral tear of right hip joint, degenerative This is a pleasant 47 year old female, she has a diagnosed labral tear from hip MR arthrography performed several years ago. She has done well with steroid injections intermittently, 1 in November 2022, another 22 July 2022, she is having recurrence of pain, we did another 1, as they are lasting about a year I think we can hold off on any recommendation for surgical intervention for now but if she gets a recurrence or if injections do not last at least 3 to 6 months we will get Dr. Steward Drone to weigh in.    ____________________________________________ Ihor Austin. Benjamin Stain, M.D., ABFM., CAQSM., AME. Primary Care and Sports Medicine [facility]  Adjunct Professor of Family Medicine  [employer]  Restaurant manager, fast food

## 2023-08-04 NOTE — Unmapped (Signed)
[facility] Refill Coordination Note    Specialty Medication(s) to be Shipped:   Inflammatory Disorders: Actemra    Other medication(s) to be shipped: No additional medications requested for fill at this time     Alice Howell, DOB: 1976/08/20  Phone: [phone] (home)       All above HIPAA information was verified with patient.     Was a Nurse, learning disability used for this call? No    Completed refill call assessment today to schedule patient's medication shipment from the [facility] [phone]).  All relevant notes have been reviewed.     Specialty medication(s) and dose(s) confirmed: Regimen is correct and unchanged.   Changes to medications: [name] reports no changes at this time.  Changes to insurance: No  New side effects reported not previously addressed with a pharmacist or physician: None reported  Questions for the pharmacist: No    Confirmed patient received a Conservation officer, historic buildings and a Surveyor, mining with first shipment. The patient will receive a drug information handout for each medication shipped and additional FDA Medication Guides as required.       DISEASE/MEDICATION-SPECIFIC INFORMATION        For patients on injectable medications: Patient currently has 1 doses left.  Next injection is scheduled for 8/13.    SPECIALTY MEDICATION ADHERENCE     Medication Adherence    Patient reported X missed doses in the last month: 0  Specialty Medication: ACTEMRA ACTPEN 162 mg/0.9 mL Pnij (tocilizumab)  Patient is on additional specialty medications: No              Were doses missed due to medication being on hold? No    Actemra 162/0.9 mg/ml: 1 days of medicine on hand        REFERRAL TO PHARMACIST     Referral to the pharmacist: Not needed      [facility]     Shipping address confirmed in Epic.       Delivery Scheduled: Yes, Expected medication delivery date: 08/12/23.     Medication will be delivered via UPS to the prescription address in Epic WAM.    Willette Pa [facility] Specialty Technician

## 2023-08-11 MED FILL — ACTEMRA ACTPEN 162 MG/0.9 ML SUBCUTANEOUS PEN INJECTOR: SUBCUTANEOUS | 28 days supply | Qty: 3.6 | Fill #2

## 2023-08-11 NOTE — Therapy (Signed)
OUTPATIENT PHYSICAL THERAPY LOWER EXTREMITY TREATMENT   Patient Name: Frances [name] MRN: [id number] DOB:1976-04-19, 47 y.o., female Today's Date: 08/11/2023  END OF SESSION:       Past Medical History:  Diagnosis Date   Anxiety    Arthritis    Back pain, chronic    Bipolar 1 disorder (HCC)    Chronic fatigue syndrome    DDD (degenerative disc disease), lumbar    Depression    Diabetes mellitus without complication (HCC)    Fibromyalgia    IUD 2012   Mirena   MTHFR gene mutation    Past Surgical History:  Procedure Laterality Date   COLONOSCOPY WITH PROPOFOL N/A 02/25/2017   Procedure: COLONOSCOPY WITH PROPOFOL;  Surgeon: Willis Modena, MD;  Location: [facility];  Service: Endoscopy;  Laterality: N/A;   left knee lateral release  1993   scar tisue removal  03/2005   left ankle   TONSILLECTOMY  age 81's   Patient Active Problem List   Diagnosis Date Noted   Urinary hesitancy 06/30/2023   Urinary frequency 06/16/2023   OAB (overactive bladder) 06/16/2023   Chronic pain syndrome 06/10/2023   Crush injury of right foot 05/25/2023   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   Microalbuminuria 09/09/2022   Night sweats 06/18/2022   DEGENERATIVE DISC DISEASE, LUMBAR SPINE 04/07/2022   Proteinuria 03/17/2022   Chronic right hip pain 01/06/2022   Acute pain of right shoulder 01/06/2022   ETD (Eustachian tube dysfunction), bilateral 08/23/2021   SOB (shortness of breath) on exertion 08/20/2021   Overweight (BMI 25.0-29.9) 06/18/2021   Attention deficit hyperactivity disorder (ADHD), 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   Hyperlipidemia LDL goal <70 03/18/2021   Type 2 diabetes mellitus without complication, without long-term current use of insulin (HCC) 03/13/2021   Seasonal allergies 12/25/2020   Bilateral leg edema 11/19/2020   Symptomatic mammary hypertrophy 09/04/2020   Recurrent major depressive disorder, in partial remission (HCC) 05/24/2020    Generalized anxiety disorder 05/24/2020   DDD (degenerative disc disease), cervical 03/13/2020   Ingrown right big toenail 03/13/2020   Labral tear of right hip joint, degenerative 01/11/2020   Grief reaction 11/28/2019   Elevated fasting glucose 04/08/2018   Borderline personality disorder (HCC) 01/22/2018   Seronegative rheumatoid arthritis (HCC) 12/09/2017   New daily persistent headache 09/26/2017   MTHFR gene mutation 09/20/2017   Bipolar I disorder (HCC) 09/16/2017   Chronic fatigue syndrome 09/16/2017   Back pain 10/13/2008   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   Lumbar spondylosis 02/10/2008   FIBROMYALGIA 02/10/2008   IRON, SERUM, ELEVATED 02/10/2008    PCP: Jomarie Longs, PA-C  REFERRING PROVIDER: Monica Becton, MD  REFERRING DIAG: M24.159 (ICD-10-CM) - Labral tear of hip, degenerative  THERAPY DIAG:  No diagnosis found.  Rationale for Evaluation and Treatment: Rehabilitation  ONSET DATE: ~1 year ago  SUBJECTIVE:   SUBJECTIVE STATEMENT: ***  EVAL: Pt reports she has a R hip labral tear and arthritis. Pt reports she rides horses and when she first gets on/off it will hurt. Pt states she will get sharp pain. Feels that it happens randomly. Can feel a catching sensation -- sometimes with standing. Pt states left hip has improved. Pt has not been doing any stretches for hips. Pt states R hip has been injected several times.   PERTINENT HISTORY: Arthritis, back pain  PAIN:  Are you having pain? Yes: NPRS scale: 5/10 currently; at worst 9/10 Pain location: R groin Pain description: sharp and brief Aggravating factors:  certain movements Relieving factors: Rest  PRECAUTIONS: None  WEIGHT BEARING RESTRICTIONS: No  FALLS:  Has patient fallen in last 6 months? No  LIVING ENVIRONMENT: Lives with: lives alone Lives in: Mobile home Stairs: Yes: External: 1-2 steps; none Has following equipment at home: None  OCCUPATION: Currently not working;  horseback riding, coloring, TV  PATIENT GOALS: Lessen the pain; try not to get surgery  NEXT MD VISIT: n/a  OBJECTIVE:   DIAGNOSTIC FINDINGS: Hip MRI 06/15/18 IMPRESSION: 1. Anterior right labral degeneration with a tear.   PATIENT SURVEYS:  FOTO 52; predicted 62   MUSCLE LENGTH: Hamstrings: WFL Thomas test: Right -5 deg; Left neutral deg  POSTURE: rounded shoulders  PALPATION: TTP around R greater trochanter, glute med, lateral hamstring   LOWER EXTREMITY MMT:  MMT Right eval Left eval Right 07/01/23  Hip flexion 4 5 4+  Hip extension 3 4- 3+  Hip abduction 4 4+ 4  Hip adduction 3- 3 3+  Hip internal rotation 4 4 4   Hip external rotation 3+ 4- 4  Knee flexion 5 4+   Knee extension 5 5   Ankle dorsiflexion     Ankle plantarflexion     Ankle inversion     Ankle eversion      (Blank rows = not tested)  LOWER EXTREMITY SPECIAL TESTS:  Hip special tests: Luisa Hart (FABER) test: positive , Trendelenburg test: positive , Thomas test: negative, Craig's test: negative, and Ely's test: negative; Painful with FADIR  FUNCTIONAL TESTS:  Did not assess  GAIT: Distance walked: 100' Assistive device utilized: None Level of assistance: Complete Independence Comments: Normal reciprocal pattern  [facility] Adult PT Treatment:                                                DATE: 08/12/23 Therapeutic Exercise: Nustep L6 x 5 min for warm up RECERT; FOTO Hip abd on slider 2 x 10 bilat Rocker board laterally static hold x 1 min, tapping x 1 min Eccentric lateral step down 6'' step x 10 bilat Runners step up alt LE 8'' x 10 bilat Prone hip ext with knee flexed 2 x 10 Reverse clam ball between knees x 30 Clam ball between ankles x 30 Wide leg squat x 10 Half kneeling wt shifts for hip mobility Hip arcs red TB x 10 bilat Resisted walking backwards 15# x 10 Resisted walking laterally 10# x 5 bilat  [facility] Adult PT Treatment:                                                DATE:  07/08/23 Therapeutic Exercise: Nustep L6 x 5 min for warm up Hip abd on slider 2 x 10 bilat Rocker board laterally static hold x 1 min, tapping x 1 min Eccentric lateral step down 6'' step x 10 bilat Runners step up alt LE 8'' x 10 bilat Prone hip ext with knee flexed 2 x 10 Reverse clam ball between knees x 30 Clam ball between ankles x 30 Wide leg squat x 10 Half kneeling wt shifts for hip mobility Hip arcs red TB x 10 bilat Resisted walking backwards 15# x 10 Resisted walking laterally 10# x 5 bilat    [facility] Adult PT  Treatment:                                                DATE: 07/01/23 Therapeutic Exercise: Nustep L6 x 5 min Wide leg squat 2 x 5 Half kneeling wt shifts for hip stretch and strengthening Resisted walking laterally 10# x 10 bilat Resisted walking backward 15# x 10 Rocker board laterally static hold x 1 min, tapping x 1 min Prone hip ext with knee flexed 2 x 10 Reverse clam ball between knees x 30 Clam ball between ankles x 30   [facility] Adult PT Treatment:                                                DATE: 06/24/23 Therapeutic Exercise: Nustep L6 x 5 min Sidestep red TB around feet along counter Standing hip ext with bent knee ball squeeze x 10 bilat Wide squat and hold 5 x 5 sec Happy baby x 30 sec Typewriter bridge x 10 Sidelying clam x 30 Reverse clam x 30 Rocker board laterally static holds 2 x 30 sec, rocker board rocking laterally x 1 min   PATIENT EDUCATION:  Education details: Exam findings, POC, initial HEP Person educated: Patient Education method: Explanation, Demonstration, and Handouts Education comprehension: verbalized understanding, returned demonstration, and needs further education  HOME EXERCISE PROGRAM: Access Code: [email or web address] URL: [email or web address] Date: 06/24/2023 Prepared by: Reggy Eye  Exercises - Hip Flexor Stretch at [facility] of Bed  - 1 x daily - 7 x weekly - 2 sets - 30 sec hold - Upright Side Lunge   - 1 x daily - 7 x weekly - 2 sets - 10 reps - Prone Heel Squeeze  - 1 x daily - 7 x weekly - 2 sets - 10 reps - 3 sec hold - Prone Bent Leg Fallout  - 1 x daily - 7 x weekly - 2 sets - 10 reps - Standing Marching  - 1 x daily - 7 x weekly - 2 sets - 10 reps - Seated Hip Flexor Stretch  - 2 x daily - 7 x weekly - 1 sets - 3 reps - 30 sec  hold - Prone Gluteal Sets  - 1 x daily - 7 x weekly - 2 sets - 10 reps - 5 sec  hold - Supine Transversus Abdominis Bracing with Pelvic Floor Contraction  - 2 x daily - 7 x weekly - 1 sets - 10 reps - 10sec  hold - Supine Alternating Shoulder Flexion  - 2 x daily - 7 x weekly - 1-2 sets - 10 reps - 2 sec  hold - Anti-Rotation Lateral Stepping with Press  - 2 x daily - 7 x weekly - 1-2 sets - 10 reps - 2-3 sec  hold - Clamshell  - 1 x daily - 7 x weekly - 3 sets - 10 reps - Sidelying Reverse Clamshell  - 1 x daily - 7 x weekly - 3 sets - 10 reps - Side Stepping with Resistance at Feet  - 1 x daily - 7 x weekly - 3 sets - 10 reps - Hip Extension with Single Leg Support Prone on Table Edge  - 1 x daily - 7 x weekly -  3 sets - 10 reps  ASSESSMENT:  CLINICAL IMPRESSION: ***  Eval: Patient is a 47 y.o. F who was seen today for physical therapy evaluation and treatment for R hip pain. Pt notes pain is intermittent, sharp and brief primarily in her groin. Assessment significant for R>L hip weakness especially in glutes, adductors and rotators. Pt would benefit from PT to address these issues for pain free community activity and recreational activities.   OBJECTIVE IMPAIRMENTS: decreased activity tolerance, decreased endurance, decreased mobility, decreased strength, increased fascial restrictions, increased muscle spasms, improper body mechanics, and pain.    GOALS: Goals reviewed with patient? Yes  SHORT TERM GOALS: Target date: 07/01/2023  Pt will be ind with initial HEP Baseline: Goal status: MET  2.  Pt will demo improved hip adduction on R to at least  3+/5 Baseline:  Goal status: MET   LONG TERM GOALS: Target date: 07/29/2023  Pt will be ind with progression and management of HEP Baseline:  Goal status: INITIAL  2.  Pt will report improved pain with horse back riding to </=50% Baseline:  Goal status: INITIAL  3.  Pt will have improved FOTO to >/=62 Baseline:  Goal status: INITIAL  4.  Pt will report worst pain to </=6/10 Baseline:  Goal status: INITIAL  PLAN:  PT FREQUENCY: 1x/week  PT DURATION: 8 weeks  PLANNED INTERVENTIONS: Therapeutic exercises, Therapeutic activity, Neuromuscular re-education, Balance training, Gait training, Patient/Family education, Self Care, Joint mobilization, Stair training, Aquatic Therapy, Dry Needling, Electrical stimulation, Spinal mobilization, Cryotherapy, Moist heat, Taping, Ionotophoresis 4mg /ml Dexamethasone, Manual therapy, and Re-evaluation  PLAN FOR NEXT SESSION: Continue R hip strengthening and stabilization exercises as tolerated.    Comer Devins, PT 08/11/2023, 5:20 PM

## 2023-08-12 ENCOUNTER — Encounter: Payer: Self-pay | Admitting: Physical Therapy

## 2023-08-12 ENCOUNTER — Ambulatory Visit: Payer: MEDICAID | Attending: Sports Medicine | Admitting: Physical Therapy

## 2023-08-12 DIAGNOSIS — M25551 Pain in right hip: Secondary | ICD-10-CM | POA: Diagnosis present

## 2023-08-12 DIAGNOSIS — M6281 Muscle weakness (generalized): Secondary | ICD-10-CM | POA: Insufficient documentation

## 2023-08-13 ENCOUNTER — Encounter: Payer: Self-pay | Admitting: Urology

## 2023-08-13 ENCOUNTER — Telehealth: Payer: Self-pay | Admitting: Physician Assistant

## 2023-08-13 ENCOUNTER — Ambulatory Visit: Payer: MEDICAID | Admitting: Urology

## 2023-08-13 VITALS — BP 125/77 | HR 111 | Ht 67.0 in | Wt 188.0 lb

## 2023-08-13 DIAGNOSIS — N3281 Overactive bladder: Secondary | ICD-10-CM | POA: Diagnosis not present

## 2023-08-13 DIAGNOSIS — M5136 Other intervertebral disc degeneration, lumbar region: Secondary | ICD-10-CM

## 2023-08-13 DIAGNOSIS — R8290 Unspecified abnormal findings in urine: Secondary | ICD-10-CM

## 2023-08-13 DIAGNOSIS — G8929 Other chronic pain: Secondary | ICD-10-CM

## 2023-08-13 DIAGNOSIS — M0609 Rheumatoid arthritis without rheumatoid factor, multiple sites: Secondary | ICD-10-CM

## 2023-08-13 DIAGNOSIS — R350 Frequency of micturition: Secondary | ICD-10-CM

## 2023-08-13 DIAGNOSIS — M51369 Other intervertebral disc degeneration, lumbar region without mention of lumbar back pain or lower extremity pain: Secondary | ICD-10-CM

## 2023-08-13 DIAGNOSIS — M545 Low back pain, unspecified: Secondary | ICD-10-CM

## 2023-08-13 LAB — MICROSCOPIC EXAMINATION

## 2023-08-13 LAB — URINALYSIS, ROUTINE W REFLEX MICROSCOPIC
Bilirubin, UA: NEGATIVE
Glucose, UA: NEGATIVE
Ketones, UA: NEGATIVE
Nitrite, UA: NEGATIVE
Protein,UA: NEGATIVE
Specific Gravity, UA: 1.015 (ref 1.005–1.030)
Urobilinogen, Ur: 0.2 mg/dL (ref 0.2–1.0)
pH, UA: 7 (ref 5.0–7.5)

## 2023-08-13 MED ORDER — SOLIFENACIN SUCCINATE 10 MG PO TABS
10.0000 mg | ORAL_TABLET | Freq: Every day | ORAL | 11 refills | Status: AC
Start: 2023-08-13 — End: ?

## 2023-08-13 NOTE — Progress Notes (Signed)
Assessment: 1. Urinary frequency   2. OAB (overactive bladder)     Plan: Urine culture sent today - will call with results Continue bladder diet Trial of solifenacin 10 mg daily.  Rx sent. Patient advised to call with results of medication in 1 month. Return to office in 2 months  Chief Complaint:  Chief Complaint  Patient presents with   Over Active Bladder    History of Present Illness:  Frances [name] is a 47 y.o. female who is seen for further evaluation of urinary frequency.  She reported urinary symptoms for approximately 6 months.  She has urinary frequency voiding every 30 minutes, occasional urgency and nocturia x 1.  She does notice some urinary hesitancy and sensation of incomplete emptying.  She has occasional slight dysuria.  No gross hematuria.  No urinary incontinence. She does have problems with constipation.   She had not been on any medical therapy for her symptoms other than Pyridium.  She was given a prescription for Myrbetriq but her insurance would not cover this. No history of recurrent UTIs.   Urine culture from 06/19/2023 grew 10-49 K Enterococcus.  She did not receive any antibiotics for this. She was given a trial of Gemtesa 75 mg daily at her visit in July 2024.  She returns today for follow-up.  She did not see any improvement in her symptoms with Gemtesa.  No decreased frequency.  No side effects.  No dysuria or gross hematuria. She has had issues with constipation but has bowel movements daily. She is working on decreasing her caffeine intake.  Portions of the above documentation were copied from a prior visit for review purposes only.   Past Medical History:  Past Medical History:  Diagnosis Date   Anxiety    Arthritis    Back pain, chronic    Bipolar 1 disorder (HCC)    Chronic fatigue syndrome    DDD (degenerative disc disease), lumbar    Depression    Diabetes mellitus without complication (HCC)    Fibromyalgia    IUD 2012    Mirena   MTHFR gene mutation     Past Surgical History:  Past Surgical History:  Procedure Laterality Date   COLONOSCOPY WITH PROPOFOL N/A 02/25/2017   Procedure: COLONOSCOPY WITH PROPOFOL;  Surgeon: Willis Modena, MD;  Location: [facility];  Service: Endoscopy;  Laterality: N/A;   left knee lateral release  1993   scar tisue removal  03/2005   left ankle   TONSILLECTOMY  age 62's    Allergies:  Allergies  Allergen Reactions   Metaxalone Hives    Family History:  Family History  Problem Relation Age of Onset   Diabetes Mother    Stroke Mother    Lupus Mother    Hypertension Mother    Congestive Heart Failure Mother    Mental illness Brother        Not clear what his diagnosis is--may be related to previous drug use.   Cancer Maternal Grandmother        colon   Diabetes Maternal Grandfather    Depression Maternal Uncle    Alcohol abuse Maternal Uncle     Social History:  Social History   Tobacco Use   Smoking status: Never   Smokeless tobacco: Never  Vaping Use   Vaping status: Never Used  Substance Use Topics   Alcohol use: No    Alcohol/week: 0.0 standard drinks of alcohol   Drug use: No    ROS:  Constitutional:  Negative for fever, chills, weight loss CV: Negative for chest pain, previous MI, hypertension Respiratory:  Negative for shortness of breath, wheezing, sleep apnea, frequent cough GI:  Negative for nausea, vomiting, bloody stool, GERD  Physical exam: BP 125/77   Pulse (!) 111   Ht 5\' 7"  (1.702 m)   Wt 188 lb (85.3 kg)   BMI 29.44 kg/m  GENERAL APPEARANCE:  Well appearing, well developed, well nourished, NAD HEENT:  Atraumatic, normocephalic, oropharynx clear NECK:  Supple without lymphadenopathy or thyromegaly ABDOMEN:  Soft, non-tender, no masses EXTREMITIES:  Moves all extremities well, without clubbing, cyanosis, or edema NEUROLOGIC:  Alert and oriented x 3, normal gait, CN II-XII grossly intact MENTAL STATUS:  appropriate BACK:   Non-tender to palpation, No CVAT SKIN:  Warm, dry, and intact  Results: U/A: 11-30 WBC, many bacteria

## 2023-08-13 NOTE — Telephone Encounter (Signed)
Prescription Request  08/13/2023  LOV: 06/16/2023  What is the name of the medication or equipment? Vicodin 5-325mg    Have you contacted your pharmacy to request a refill? Yes   Which pharmacy would you like this sent to? [facility]  [place]  Phone [phone]

## 2023-08-14 ENCOUNTER — Encounter: Payer: Self-pay | Admitting: Urology

## 2023-08-14 MED ORDER — HYDROCODONE-ACETAMINOPHEN 5-325 MG PO TABS
1.0000 | ORAL_TABLET | Freq: Two times a day (BID) | ORAL | 0 refills | Status: DC | PRN
Start: 2023-08-14 — End: 2023-10-20

## 2023-08-14 NOTE — Addendum Note (Signed)
Addended by: Jomarie Longs on: 08/14/2023 12:50 PM   Modules accepted: Orders

## 2023-08-14 NOTE — Telephone Encounter (Signed)
..  PDMP reviewed during this encounter. No concerns.  Visit within 3 months UTD pain contract Sent refill

## 2023-08-14 NOTE — Telephone Encounter (Signed)
Sent!

## 2023-08-17 LAB — URINE CULTURE

## 2023-08-17 MED ORDER — SULFAMETHOXAZOLE-TRIMETHOPRIM 800-160 MG PO TABS: 1.0000 | ORAL_TABLET | Freq: Two times a day (BID) | ORAL | 0 refills | Status: AC

## 2023-08-17 NOTE — Addendum Note (Signed)
Addended by: Milderd Meager on: 08/17/2023 12:21 PM   Modules accepted: Orders

## 2023-08-31 ENCOUNTER — Other Ambulatory Visit (HOSPITAL_COMMUNITY): Payer: MEDICAID

## 2023-09-02 ENCOUNTER — Other Ambulatory Visit: Payer: Self-pay | Admitting: Psychiatry

## 2023-09-03 ENCOUNTER — Other Ambulatory Visit: Payer: Medicaid Other | Admitting: Pharmacist

## 2023-09-03 LAB — THYROID PANEL WITH TSH
Free Thyroxine Index: 1.5 (ref 1.2–4.9)
T3 Uptake Ratio: 29 % (ref 24–39)
T4, Total: 5 ug/dL (ref 4.5–12.0)
TSH: 1.03 u[IU]/mL (ref 0.450–4.500)

## 2023-09-03 LAB — VITAMIN B12: Vitamin B-12: 461 pg/mL (ref 232–1245)

## 2023-09-03 LAB — LIPID PANEL
Chol/HDL Ratio: 3.4 ratio (ref 0.0–4.4)
Cholesterol, Total: 132 mg/dL (ref 100–199)
HDL: 39 mg/dL — ABNORMAL LOW (ref >39)
LDL Chol Calc (NIH): 60 mg/dL (ref 0–99)
Triglycerides: 201 mg/dL — ABNORMAL HIGH (ref 0–149)
VLDL Cholesterol Cal: 33 mg/dL (ref 5–40)

## 2023-09-03 LAB — VITAMIN D 25 HYDROXY (VIT D DEFICIENCY, FRACTURES): Vit D, 25-Hydroxy: 71.4 ng/mL (ref 30.0–100.0)

## 2023-09-03 LAB — SPECIMEN STATUS REPORT

## 2023-09-03 LAB — HEMOGLOBIN A1C
Est. average glucose Bld gHb Est-mCnc: 111 mg/dL
Hgb A1c MFr Bld: 5.5 % (ref 4.8–5.6)

## 2023-09-03 LAB — PROLACTIN: Prolactin: 16.4 ng/mL (ref 4.8–33.4)

## 2023-09-03 NOTE — Patient Instructions (Signed)
[name],  It was such a pleasure working with you - don't hesitate to ask Lesly Rubenstein or the office staff if you desire pharmacist support in the future!   Take care, Elmarie Shiley, PharmD, BCPS Clinical Pharmacist [facility]

## 2023-09-03 NOTE — Progress Notes (Signed)
09/03/2023 Name: Frances [name] MRN: [id number] DOB: 06-15-1976   Frances [name] is a 47 y.o. year old female who presented for a telephone visit.   They were referred to the pharmacist by their PCP for assistance in managing diabetes.   Subjective:  Care Team: Primary Care Provider: Nolene Ebbs  Medication Access/Adherence  Current Pharmacy:  [facility], [place] - [address] [place] [address] Phone: [phone] Fax: [phone]  [facility] [address] [place] - [address] [place] [address] Phone: [phone] Fax: [phone]    Diabetes:  Current medications: ozempic 1mg  weekly   Current glucose readings: 100 Using traditional meter; testing rarely  Current medication access support: previously ozempic via PAP was uninsured, now medicaid and prescriptions covered well.   Objective:  Lab Results  Component Value Date   HGBA1C 5.5 06/10/2023    Lab Results  Component Value Date   CREATININE 0.89 09/10/2022   BUN 13 09/10/2022   NA 138 09/10/2022   K 4.7 09/10/2022   CL 99 09/10/2022   CO2 20 09/10/2022    Lab Results  Component Value Date   CHOL 100 09/10/2022   HDL 40 09/10/2022   LDLCALC 41 09/10/2022   TRIG 103 09/10/2022   CHOLHDL 2.5 09/10/2022    Medications Reviewed Today     Reviewed by Gabriel Carina, RPH (Pharmacist) on 09/03/23 at 0920  Med List Status: <None>   Medication Order Taking? Sig Documenting Provider Last Dose Status Informant  albuterol (VENTOLIN HFA) 108 (90 Base) MCG/ACT inhaler [id number] Yes Take 2 puffs 15 minutes before exercise and as needed for shortness of breath. Jomarie Longs, PA-C Taking Active Self  ARIPiprazole (ABILIFY) 15 MG tablet [id number] Yes Take 1 tablet (15 mg total) by mouth daily. Shanna Cisco, NP Taking Active   atorvastatin  (LIPITOR) 20 MG tablet [id number] Yes TAKE 1 TABLET BY MOUTH DAILY Jomarie Longs, PA-C Taking Active   Azelastine HCl 137 MCG/SPRAY SOLN [id number] Yes USE 2 SPRAYS ALTERNATE NOSTRILS TWICE DAILY Breeback, Jade L, PA-C Taking Active   baclofen (LIORESAL) 10 MG tablet [id number] Yes Take 1 tablet (10 mg total) by mouth 2 (two) times daily as needed for muscle spasms. Jomarie Longs, PA-C Taking Active   blood glucose meter kit and supplies KIT [id number] Yes Dispense based on patient and insurance preference. Use up to four times daily as directed. Jomarie Longs, PA-C Taking Active   buPROPion (WELLBUTRIN XL) 300 MG 24 hr tablet [id number] Yes Take 1 tablet (300 mg total) by mouth daily. Shanna Cisco, NP Taking Active   cholecalciferol (VITAMIN D3) 25 MCG (1000 UT) tablet [id number] Yes Take 1,000 Units by mouth daily. [provider] Taking Active   diclofenac (VOLTAREN) 75 MG EC tablet [id number] Yes Take 1 tablet (75 mg total) by mouth 2 (two) times daily. Eustace Moore, MD Taking Active   DULoxetine HCl 30 MG CSDR [id number] Yes Take 90 mg by mouth daily. [provider] Taking Active   fluticasone (FLONASE) 50 MCG/ACT nasal spray [id number] Yes 2 SPRAYS IN EACH NOSTRIL DAILY Breeback, Jade L, PA-C Taking Active   HYDROcodone-acetaminophen (NORCO/VICODIN) 5-325 MG tablet [id number] Yes Take 1 tablet by mouth 2 (two) times daily as needed for moderate pain (back pain). Jomarie Longs, PA-C Taking Active   hydroxychloroquine (PLAQUENIL) 200 MG tablet [id number] Yes Take  200 mg by mouth 2 (two) times daily. 200mg  twice daily on weekdays and 200mg  once daily on weekends as of 12/03/22 review [provider] Taking Active   levocetirizine (XYZAL) 5 MG tablet [id number] Yes TAKE 1 TABLET BY MOUTH EVERY EVENING Breeback, Jade L, PA-C Taking Active   montelukast (SINGULAIR) 10 MG tablet [id number] Yes Take 1 tablet (10 mg total) by mouth at bedtime. Jomarie Longs, PA-C  Taking Active   pregabalin (LYRICA) 200 MG capsule [id number] Yes 1 capsule p.o. 2-3 times daily Monica Becton, MD Taking Active   Semaglutide, 1 MG/DOSE, 4 MG/3ML SOPN [id number] Yes Inject 1 mg as directed once a week. Jomarie Longs, PA-C Taking Active   solifenacin (VESICARE) 10 MG tablet [id number] Yes Take 1 tablet (10 mg total) by mouth daily. Stoneking, Danford Bad., MD Taking Active   Tocilizumab (ACTEMRA) 162 MG/0.9ML Konrad Penta [id number] Yes Inject 162 mg into the skin once a week. [provider] Taking Active   Med List Note Geri Seminole, CPhT 02/20/17 1343): Pt uses [place] med assist               Assessment/Plan:   Diabetes: - Currently controlled - Recommend to consider increase ozempic to 2mg  weekly dosing at upcoming PCP visit (for pt desiring more weight loss in setting of otherwise controlled glucose and tolerating well from GI perspective) - Recommend to check glucose if any signs/symptoms of low blood sugar, counseled patient - Of note, patient expressed possibly changing muscle relaxers and/or adjusting dose of norco for relief of worsened back pain. She will discuss at upcoming PCP visit.    Follow Up Plan: none scheduled, no needs at this time  Lynnda Shields, PharmD, BCPS Clinical Pharmacist [facility]

## 2023-09-04 NOTE — Progress Notes (Signed)
Provider called patient to discuss her labs.  Provider informed patient that her triglycerides was somewhat elevated.  She notes that she is currently taking medication for her cholesterol and will speak to her PCP regarding this elevated level at her appointment next week.  No other concerns at this time.

## 2023-09-07 DIAGNOSIS — M0600 Rheumatoid arthritis without rheumatoid factor, unspecified site: Principal | ICD-10-CM

## 2023-09-07 MED ORDER — ACTEMRA ACTPEN 162 MG/0.9 ML SUBCUTANEOUS PEN INJECTOR
SUBCUTANEOUS | 2 refills | 28 days
Start: 2023-09-07 — End: ?

## 2023-09-08 MED ORDER — ACTEMRA ACTPEN 162 MG/0.9 ML SUBCUTANEOUS PEN INJECTOR
SUBCUTANEOUS | 2 refills | 28 days | Status: CP
Start: 2023-09-08 — End: ?
  Filled 2023-09-10: qty 3.6, 28d supply, fill #0

## 2023-09-08 NOTE — Unmapped (Signed)
Actemra refill  Last Visit Date: 07/22/2023  Next Visit Date: 12/25/2023    Lab Results   Component Value Date    ALT 64 (H) 07/22/2023    AST 35 (H) 07/22/2023    ALBUMIN 3.8 07/22/2023    CREATININE 0.74 07/22/2023     Lab Results   Component Value Date    WBC 3.5 (L) 07/22/2023    HGB 13.0 07/22/2023    HCT 38.3 07/22/2023    PLT 169 07/22/2023     Lab Results   Component Value Date    NEUTROPCT 40.0 07/22/2023    LYMPHOPCT 47.9 07/22/2023    MONOPCT 9.1 07/22/2023    EOSPCT 1.8 07/22/2023    BASOPCT 1.2 07/22/2023

## 2023-09-09 NOTE — Unmapped (Signed)
[facility] Refill Coordination Note    Specialty Medication(s) to be Shipped:   Inflammatory Disorders: Actemra    Other medication(s) to be shipped: No additional medications requested for fill at this time     Alice Howell, DOB: 10-13-1976  Phone: There are no phone numbers on file.      All above HIPAA information was verified with patient.     Was a Nurse, learning disability used for this call? No    Completed refill call assessment today to schedule patient's medication shipment from the [facility]  [phone]).  All relevant notes have been reviewed.     Specialty medication(s) and dose(s) confirmed: Regimen is correct and unchanged.   Changes to medications: Alice Howell reports starting the following medications: Vesicare - added to med list in Epic  Changes to insurance: No  New side effects reported not previously addressed with a pharmacist or physician: None reported  Questions for the pharmacist: No    Confirmed patient received a Conservation officer, historic buildings and a Surveyor, mining with first shipment. The patient will receive a drug information handout for each medication shipped and additional FDA Medication Guides as required.       DISEASE/MEDICATION-SPECIFIC INFORMATION        For patients on injectable medications: Patient currently has 0 doses left.  Next injection is scheduled for 9/24.    SPECIALTY MEDICATION ADHERENCE     Medication Adherence    Patient reported X missed doses in the last month: 0  Specialty Medication: ACTEMRA ACTPEN 162 mg/0.9 mL Pnij (tocilizumab)  Patient is on additional specialty medications: No              Were doses missed due to medication being on hold? No    Actemra 162  mg/0.85ml : 0 doses of medicine on hand       REFERRAL TO PHARMACIST     Referral to the pharmacist: Not needed      [facility]     Shipping address confirmed in Epic.       Delivery Scheduled: Yes, Expected medication delivery date: 9/20.     Medication will be delivered via UPS to the prescription address in Epic WAM.    Clydell Hakim, PharmD   [facility]  Specialty Pharmacist

## 2023-09-10 ENCOUNTER — Telehealth (HOSPITAL_COMMUNITY): Payer: MEDICAID | Admitting: Psychiatry

## 2023-09-10 ENCOUNTER — Encounter (HOSPITAL_COMMUNITY): Payer: Self-pay | Admitting: Psychiatry

## 2023-09-10 DIAGNOSIS — F319 Bipolar disorder, unspecified: Secondary | ICD-10-CM

## 2023-09-10 DIAGNOSIS — F900 Attention-deficit hyperactivity disorder, predominantly inattentive type: Secondary | ICD-10-CM | POA: Diagnosis not present

## 2023-09-10 MED ORDER — ARIPIPRAZOLE 15 MG PO TABS: 15.0000 mg | ORAL_TABLET | Freq: Every day | ORAL | 3 refills | Status: DC

## 2023-09-10 MED ORDER — BUPROPION HCL ER (XL) 300 MG PO TB24: 300.0000 mg | ORAL_TABLET | Freq: Every day | ORAL | 3 refills | Status: DC

## 2023-09-10 NOTE — Progress Notes (Signed)
BH MD/PA/NP OP Progress Note Virtual Visit via Video Note  I connected with Frances [name] on 09/10/23 at  1:30 PM EDT by a video enabled telemedicine application and verified that I am speaking with the correct person using two identifiers.  Location: Patient: Home Provider: Clinic   I discussed the limitations of evaluation and management by telemedicine and the availability of in person appointments. The patient expressed understanding and agreed to proceed.  I provided 30 minutes of non-face-to-face time during this encounter.              09/10/2023 8:49 AM Frances [name]  MRN:  [id number]  Chief Complaint: "I stopped Ingrezza"  HPI: 47 year old female seen today for follow up psychiatric evaluation. She has a psychiatric history of bipolar 1, borderline personality disorder, depression, and anxiety. She is currently managed on Abilify 15 mg nightly, Ingrezza 40 mg daily, Wellbutrin 300 we will mg daily, and Cymbalta 90 mg daily (from rheumatologist). Today, patient notes that medications are effective in managing her symptoms. She reports that she is no longer taking ingrezza.   Today the patient was well-groomed, pleasant, cooperative, engaged in eye contact, and engaged in conversation. She informed Clinical research associate that her  abnormal muscle movements has not improved with Cogentin.  She notes that stopped Ingrezza because it makes her sleepy. She notes that her symptoms of TD has improved. She notes that since her last visit her mood continues to be stable and notes that she has minimal anxiety and depression. Today provider conducted a GAD 7 and patient scored a 3, at her last visit she scored a 2. Provider also conducted a PHQ 9 and patient scored an 8, at her last visit she scored 9. She endorses adequate sleep and appetite. Today she denies SI/HI/VAH, mania or paranoia.  Patient informed Clinical research associate that she continues to have pain in her back and her hands.   she  Patient  informed Clinical research associate that she enjoys riding her horses.  She notes that she plans to go riding today.   No medication changes made today.  Patient agreeable to continue medications as prescribed.  No other concerns at this time.     Visit Diagnosis:    ICD-10-CM   1. Bipolar I disorder (HCC)  F31.9 buPROPion (WELLBUTRIN XL) 300 MG 24 hr tablet    ARIPiprazole (ABILIFY) 15 MG tablet    2. Attention deficit hyperactivity disorder (ADHD), predominantly inattentive type  F90.0 buPROPion (WELLBUTRIN XL) 300 MG 24 hr tablet       Past Psychiatric History: Bipolar, anxiety, and depression Past Medical History:  Past Medical History:  Diagnosis Date   Anxiety    Arthritis    Back pain, chronic    Bipolar 1 disorder (HCC)    Chronic fatigue syndrome    DDD (degenerative disc disease), lumbar    Depression    Diabetes mellitus without complication (HCC)    Fibromyalgia    IUD 2012   Mirena   MTHFR gene mutation     Past Surgical History:  Procedure Laterality Date   COLONOSCOPY WITH PROPOFOL N/A 02/25/2017   Procedure: COLONOSCOPY WITH PROPOFOL;  Surgeon: Willis Modena, MD;  Location: [facility];  Service: Endoscopy;  Laterality: N/A;   left knee lateral release  1993   scar tisue removal  03/2005   left ankle   TONSILLECTOMY  age 21's    Family Psychiatric History: Maternal aunts Bipolar disorder and uncle alcoholic  Family History:  Family History  Problem Relation Age of Onset   Diabetes Mother    Stroke Mother    Lupus Mother    Hypertension Mother    Congestive Heart Failure Mother    Mental illness Brother        Not clear what his diagnosis is--may be related to previous drug use.   Cancer Maternal Grandmother        colon   Diabetes Maternal Grandfather    Depression Maternal Uncle    Alcohol abuse Maternal Uncle     Social History:  Social History   Socioeconomic History   Marital status: Single    Spouse name: Not on file   Number of children: 0    Years of education: Not on file   Highest education level: Bachelor's degree (e.g., BA, AB, BS)  Occupational History   Occupation: Airline pilot at times  Tobacco Use   Smoking status: Never   Smokeless tobacco: Never  Vaping Use   Vaping status: Never Used  Substance and Sexual Activity   Alcohol use: No    Alcohol/week: 0.0 standard drinks of alcohol   Drug use: No   Sexual activity: Not on file  Other Topics Concern   Not on file  Social History Narrative   Originally from [place], outside of [place].    Moved here permanently 2012.   Intermittently worked on a farm.   Lives with many cats--3 plus fosters cats.    Single, lives alone in a one story home. Rarely drinks caffeine. 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   Social Determinants of Health   Financial Resource Strain: Medium Risk (06/12/2023)   Overall Financial Resource Strain (CARDIA)    Difficulty of Paying Living Expenses: Somewhat hard  Food Insecurity: Food Insecurity Present (06/12/2023)   Hunger Vital Sign    Worried About Running Out of Food in the Last Year: Sometimes true    Ran Out of Food in the Last Year: Never true  Transportation Needs: No Transportation Needs (06/12/2023)   PRAPARE - Administrator, Civil Service (Medical): No    Lack of Transportation (Non-Medical): No  Physical Activity: Insufficiently Active (07/27/2023)   Exercise Vital Sign    Days of Exercise per Week: 3 days    Minutes of Exercise per Session: 30 min  Stress: No Stress Concern Present (06/12/2023)   [employer] of Occupational Health - Occupational Stress Questionnaire    Feeling of Stress : Only a little  Social Connections: Socially Isolated (06/12/2023)   Social Connection and Isolation Panel [NHANES]    Frequency of Communication with Friends and Family: Never    Frequency of Social Gatherings with Friends and Family: Twice a week    Attends Religious Services: Never    Automotive engineer or Organizations: Yes    Attends Engineer, structural: More than 4 times per year    Marital Status: Never married    Allergies:  Allergies  Allergen Reactions   Metaxalone Hives    Metabolic Disorder Labs: Lab Results  Component Value Date   HGBA1C 5.5 09/02/2023   MPG 103 04/14/2018   Lab Results  Component Value Date   PROLACTIN 16.4 09/02/2023   Lab Results  Component Value Date   CHOL 132 09/02/2023   TRIG 201 (H) 09/02/2023   HDL 39 (L) 09/02/2023   CHOLHDL 3.4 09/02/2023   LDLCALC 60 09/02/2023   LDLCALC 41 09/10/2022   Lab Results  Component  Value Date   TSH 1.030 09/02/2023   TSH 0.866 11/21/2020    Therapeutic Level Labs: No results found for: "LITHIUM" No results found for: "VALPROATE" No results found for: "CBMZ"  Current Medications: Current Outpatient Medications  Medication Sig Dispense Refill   albuterol (VENTOLIN HFA) 108 (90 Base) MCG/ACT inhaler Take 2 puffs 15 minutes before exercise and as needed for shortness of breath. 6.7 g 1   ARIPiprazole (ABILIFY) 15 MG tablet Take 1 tablet (15 mg total) by mouth daily. 30 tablet 3   atorvastatin (LIPITOR) 20 MG tablet TAKE 1 TABLET BY MOUTH DAILY 60 tablet 0   Azelastine HCl 137 MCG/SPRAY SOLN USE 2 SPRAYS ALTERNATE NOSTRILS TWICE DAILY 30 mL 2   baclofen (LIORESAL) 10 MG tablet Take 1 tablet (10 mg total) by mouth 2 (two) times daily as needed for muscle spasms. 180 each 1   blood glucose meter kit and supplies KIT Dispense based on patient and insurance preference. Use up to four times daily as directed. 1 each 0   buPROPion (WELLBUTRIN XL) 300 MG 24 hr tablet Take 1 tablet (300 mg total) by mouth daily. 30 tablet 3   cholecalciferol (VITAMIN D3) 25 MCG (1000 UT) tablet Take 1,000 Units by mouth daily.     diclofenac (VOLTAREN) 75 MG EC tablet Take 1 tablet (75 mg total) by mouth 2 (two) times daily. 60 tablet 0   DULoxetine HCl 30 MG CSDR Take 90 mg by mouth daily.      fluticasone (FLONASE) 50 MCG/ACT nasal spray 2 SPRAYS IN EACH NOSTRIL DAILY 16 g 0   HYDROcodone-acetaminophen (NORCO/VICODIN) 5-325 MG tablet Take 1 tablet by mouth 2 (two) times daily as needed for moderate pain (back pain). 60 tablet 0   hydroxychloroquine (PLAQUENIL) 200 MG tablet Take 200 mg by mouth 2 (two) times daily. 200mg  twice daily on weekdays and 200mg  once daily on weekends as of 12/03/22 review     levocetirizine (XYZAL) 5 MG tablet TAKE 1 TABLET BY MOUTH EVERY EVENING 90 tablet 1   montelukast (SINGULAIR) 10 MG tablet Take 1 tablet (10 mg total) by mouth at bedtime. 90 tablet 3   pregabalin (LYRICA) 200 MG capsule 1 capsule p.o. 2-3 times daily 90 capsule 3   Semaglutide, 1 MG/DOSE, 4 MG/3ML SOPN Inject 1 mg as directed once a week. 9 mL 0   solifenacin (VESICARE) 10 MG tablet Take 1 tablet (10 mg total) by mouth daily. 30 tablet 11   Tocilizumab (ACTEMRA) 162 MG/0.9ML SOSY Inject 162 mg into the skin once a week.     No current facility-administered medications for this visit.     Musculoskeletal: Strength & Muscle Tone: within normal limits and  telehealth visit Gait & Station: normal, telehealth visit Patient leans: N/A  Psychiatric Specialty Exam: Review of Systems  There were no vitals taken for this visit.There is no height or weight on file to calculate BMI.  General Appearance: Well Groomed  Eye Contact:  Good  Speech:  Clear and Coherent and Normal Rate  Volume:  Normal  Mood:  Euthymic  Affect:  Congruent  Thought Process:  Coherent, Goal Directed and Linear  Orientation:  Full (Time, Place, and Person)  Thought Content: WDL and Logical   Suicidal Thoughts:  No  Homicidal Thoughts:  No  Memory:  Immediate;   Good Recent;   Good Remote;   Good  Judgement:  Good  Insight:  Good  Psychomotor Activity:  Normal  Concentration:  Concentration: Good and  Attention Span: Good  Recall:  Good  Fund of Knowledge: Good  Language: Good  Akathisia:  No  Handed:   Right  AIMS (if indicated): not done  Assets:  Communication Skills Desire for Improvement Financial Resources/Insurance Housing Social Support  ADL's:  Intact  Cognition: WNL  Sleep:  Good   Screenings: AIMS    Flowsheet Row Clinical Support from 06/30/2023 in [facility]  AIMS Total Score 5 0      AUDIT    Flowsheet Row [facility] ([facility]  Alcohol Use Disorder Identification Test Final Score (AUDIT) 0      GAD-7    Flowsheet Row Video Visit from 09/10/2023 in [facility] Counselor from 07/27/2023 in [facility] Clinical Support from 06/30/2023 in [facility] Office Visit from 06/10/2023 in [facility] Primary Care & Sports Medicine at [facility] Office Visit from 05/06/2023 in [facility] Primary Care & Sports Medicine at [facility]  Total GAD-7 Score 3 4 2  0 0      PHQ2-9    Flowsheet Row Video Visit from 09/10/2023 in [facility] Counselor from 07/27/2023 in [facility] Office Visit from 07/03/2023 in [facility] Primary Care & Sports Medicine at [facility] Clinical Support from 06/30/2023 in [facility] Office Visit from 06/16/2023 in [facility] Primary Care & Sports Medicine at [facility]  PHQ-2 Total Score 1 2 2 2 2   PHQ-9 Total Score 8 10 -- 9 --      Flowsheet Row Video Visit from 09/10/2023 in [facility] Counselor from 07/27/2023 in [facility] ED from 11/08/2022 in [facility] Emergency Department at [facility]  C-SSRS RISK CATEGORY No Risk No Risk No Risk        Assessment and Plan: Patient notes that he is  doing well on his current medication regimen.  No medication changes made today.  Patient agreeable to continue medication as prescribed.  1. Bipolar I disorder (HCC)  Continue- buPROPion (WELLBUTRIN XL) 300 MG 24 hr tablet; Take 1 tablet (300 mg total) by mouth daily.  Dispense: 30 tablet; Refill: 3 Continue- ARIPiprazole (ABILIFY) 15 MG tablet; Take 1 tablet (15 mg total) by mouth daily.  Dispense: 30 tablet; Refill: 3  2. Attention deficit hyperactivity disorder (ADHD), predominantly inattentive type  Continue- buPROPion (WELLBUTRIN XL) 300 MG 24 hr tablet; Take 1 tablet (300 mg total) by mouth daily.  Dispense: 30 tablet; Refill: 3     Follow up in 2.5 moths Follow up with therapy   Shanna Cisco, NP 09/10/2023, 8:49 AM

## 2023-09-11 ENCOUNTER — Encounter: Payer: Self-pay | Admitting: Physician Assistant

## 2023-09-11 ENCOUNTER — Ambulatory Visit (INDEPENDENT_AMBULATORY_CARE_PROVIDER_SITE_OTHER): Payer: MEDICAID | Admitting: Physician Assistant

## 2023-09-11 VITALS — BP 112/77 | HR 92 | Ht 67.0 in | Wt 188.0 lb

## 2023-09-11 DIAGNOSIS — E663 Overweight: Secondary | ICD-10-CM

## 2023-09-11 DIAGNOSIS — M25551 Pain in right hip: Secondary | ICD-10-CM

## 2023-09-11 DIAGNOSIS — Z87440 Personal history of urinary (tract) infections: Secondary | ICD-10-CM

## 2023-09-11 DIAGNOSIS — M545 Low back pain, unspecified: Secondary | ICD-10-CM

## 2023-09-11 DIAGNOSIS — M0609 Rheumatoid arthritis without rheumatoid factor, multiple sites: Secondary | ICD-10-CM

## 2023-09-11 DIAGNOSIS — Z23 Encounter for immunization: Secondary | ICD-10-CM

## 2023-09-11 DIAGNOSIS — M5136 Other intervertebral disc degeneration, lumbar region: Secondary | ICD-10-CM

## 2023-09-11 DIAGNOSIS — E1169 Type 2 diabetes mellitus with other specified complication: Secondary | ICD-10-CM | POA: Diagnosis not present

## 2023-09-11 DIAGNOSIS — G8929 Other chronic pain: Secondary | ICD-10-CM

## 2023-09-11 DIAGNOSIS — Z7985 Long-term (current) use of injectable non-insulin antidiabetic drugs: Secondary | ICD-10-CM

## 2023-09-11 LAB — POCT UA - MICROALBUMIN
Albumin/Creatinine Ratio, Urine, POC: <30
Creatinine, POC: 100 mg/dL
Microalbumin Ur, POC: 10 mg/L

## 2023-09-11 LAB — POCT URINALYSIS DIP (CLINITEK)
Bilirubin, UA: NEGATIVE
Blood, UA: NEGATIVE
Glucose, UA: NEGATIVE mg/dL
Ketones, POC UA: NEGATIVE mg/dL
Leukocytes, UA: NEGATIVE
Nitrite, UA: NEGATIVE
POC PROTEIN,UA: NEGATIVE
Spec Grav, UA: 1.015 (ref 1.010–1.025)
Urobilinogen, UA: 0.2 E.U./dL
pH, UA: 5.5 (ref 5.0–8.0)

## 2023-09-11 MED ORDER — SEMAGLUTIDE (2 MG/DOSE) 8 MG/3ML ~~LOC~~ SOPN
2.0000 mg | PEN_INJECTOR | SUBCUTANEOUS | 0 refills | Status: DC
Start: 2023-09-11 — End: 2023-12-01

## 2023-09-11 NOTE — Progress Notes (Unsigned)
Established Patient Office Visit  Subjective   Patient ID: Frances [name], female    DOB: Sep 06, 1976  Age: 47 y.o. MRN: [id number]  Chief Complaint  Patient presents with   Follow-up    3 month f/u -  patietn also wanting to discuss strength change for norco and ozempic . She would like to discuss elevated cholesterol reading on last blood work. She states psychiatrist just did blood work. (A1C shows at 5.5) she also wanted to ask why Cogenta removed from med list- she states she can not take Ingrezza.    HPI Pt is a 47 yo female with T2DM, RA, lumbar DDD and chronic pain, HLD who presents to the clinic for 3 month follow.   She is doing well. She is compliant with medication. She is struggling to lose weight and gaining some weight. She would like to increase dose of ozempic. She is not checking her sugars. Denies any hypoglycemic events. No SE of ozempic. She is active with her horses. Denies any CP, palpitations, headaches or vision changes. She does want to go over lab work.    Pt does have medication question and why Cogenta was removed from her medication list.   .. 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   Lumbar spondylosis 02/10/2008   Back pain 10/13/2008   FIBROMYALGIA 02/10/2008   IRON, SERUM, ELEVATED 02/10/2008   Bipolar I disorder (HCC) 09/16/2017   Chronic fatigue syndrome 09/16/2017   MTHFR gene mutation 09/20/2017   New daily persistent headache 09/26/2017   Seronegative rheumatoid arthritis (HCC) 12/09/2017   Borderline personality disorder (HCC) 01/22/2018   Elevated fasting glucose 04/08/2018   Grief reaction 11/28/2019   Labral tear of right hip joint, degenerative 01/11/2020   DDD (degenerative disc disease), cervical 03/13/2020   Ingrown right big toenail 03/13/2020   Recurrent major depressive disorder, in partial remission (HCC) 05/24/2020   Generalized anxiety disorder 05/24/2020   Symptomatic  mammary hypertrophy 09/04/2020   Bilateral leg edema 11/19/2020   Seasonal allergies 12/25/2020   Diabetes mellitus (HCC) 03/13/2021   Hyperlipidemia LDL goal <70 03/18/2021   Attention deficit hyperactivity disorder (ADHD), 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   Overweight (BMI 25.0-29.9) 06/18/2021   SOB (shortness of breath) on exertion 08/20/2021   ETD (Eustachian tube dysfunction), bilateral 08/23/2021   Chronic right hip pain 01/06/2022   Acute pain of right shoulder 01/06/2022   Proteinuria 03/17/2022   DDD (degenerative disc disease), lumbar 04/07/2022   Night sweats 06/18/2022   Microalbuminuria 09/09/2022   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   Crush injury of right foot 05/25/2023   Chronic pain syndrome 06/10/2023   Urinary frequency 06/16/2023   OAB (overactive bladder) 06/16/2023   Urinary hesitancy 06/30/2023   Personal history UTI 09/14/2023   Rheumatoid arthritis 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 (HCC) 09/14/2023   Resolved Ambulatory Problems    Diagnosis Date Noted   FEVER, RECURRENT 04/02/2009   Hypothyroidism 02/10/2008   SINUSITIS- ACUTE-NOS 03/24/2008   URI 01/17/2009   DERMATITIS, ALLERGIC 06/05/2008   ANKLE PAIN, BILATERAL 07/28/2008   FATIGUE 05/05/2008   OPEN WOUND FT NO TOE ALONE WITHOUT MENTION COMP 03/28/2009   DYSPNEA 10/15/2011   Fibromyalgia 09/16/2017   TMJ (temporomandibular joint syndrome) 09/26/2017   Anxiety 01/22/2018   Depression    Bipolar 1 disorder, depressed, severe (HCC) 01/25/2018   Decreased visual acuity 08/30/2018   Right corneal abrasion 08/30/2018   Toenail fungus 09/09/2019  Vasovagal response 09/09/2019   Chronic right-sided low back pain without sciatica 01/11/2020   Macromastia 06/29/2020   Xiphoid pain 08/13/2020   Sternum pain 08/13/2020   Class 1 obesity due to excess calories without serious comorbidity with body mass index (BMI) of 32.0 to 32.9 in adult 08/13/2020   Costochondritis  10/22/2020   Rib pain on right side 10/22/2020   Tachycardia 10/22/2020   Hepatomegaly 10/22/2020   Chest congestion 11/20/2020   Sinus drainage 11/20/2020   Ear popping, bilateral 08/20/2021   Right lateral epicondylitis 12/31/2021   Weak urine stream 03/17/2022   Acute left-sided low back pain without sciatica 04/07/2022   Acute midline low back pain without sciatica 04/07/2022   Irritant contact dermatitis due to plants, except food 06/18/2022   Ingrown toenail of right foot 09/09/2022   Chronic bilateral low back pain without sciatica 09/25/2022   Past Medical History:  Diagnosis Date   Arthritis    Back pain, chronic    Bipolar 1 disorder (HCC)    Diabetes mellitus without complication (HCC)    IUD 2012     ROS See hPI.    Objective:     BP 112/77   Pulse 92   Ht 5\' 7"  (1.702 m)   Wt 188 lb (85.3 kg)   SpO2 99%   BMI 29.44 kg/m  BP Readings from Last 3 Encounters:  09/11/23 112/77  08/13/23 125/77  07/16/23 122/84   Wt Readings from Last 3 Encounters:  09/11/23 188 lb (85.3 kg)  08/13/23 188 lb (85.3 kg)  07/16/23 181 lb (82.1 kg)    .[name] Results for orders placed or performed in visit on 09/11/23  POCT UA - Microalbumin  Result Value Ref Range   Microalbumin Ur, POC 10 mg/L   Creatinine, POC 100 mg/dL   Albumin/Creatinine Ratio, Urine, POC <30   POCT URINALYSIS DIP (CLINITEK)  Result Value Ref Range   Color, UA yellow yellow   Clarity, UA clear clear   Glucose, UA negative negative mg/dL   Bilirubin, UA negative negative   Ketones, POC UA negative negative mg/dL   Spec Grav, UA 3.244 0.102 - 1.025   Blood, UA negative negative   pH, UA 5.5 5.0 - 8.0   POC PROTEIN,UA negative negative, trace   Urobilinogen, UA 0.2 0.2 or 1.0 E.U./dL   Nitrite, UA Negative Negative   Leukocytes, UA Negative Negative   .[name] Diabetic Foot Exam - Simple   Simple Foot Form  09/11/2023  2:00 PM  Visual Inspection No deformities, no ulcerations, no other skin  breakdown bilaterally: Yes Sensation Testing Intact to touch and monofilament testing bilaterally: Yes Pulse Check Posterior Tibialis and Dorsalis pulse intact bilaterally: Yes Comments      Physical Exam Constitutional:      Appearance: Normal appearance.  HENT:     Head: Normocephalic.  Cardiovascular:     Rate and Rhythm: Normal rate and regular rhythm.     Heart sounds: Normal heart sounds.  Pulmonary:     Effort: Pulmonary effort is normal.     Breath sounds: Normal breath sounds.  Neurological:     General: No focal deficit present.     Mental Status: She is alert and oriented to person, place, and time.  Psychiatric:        Mood and Affect: Mood normal.       The 10-year ASCVD risk score (Arnett DK, et al., 2019) is: 1.5%    Assessment & Plan:  Marland KitchenMarland KitchenKobi "[name]" was  seen today for follow-up.  Diagnoses and all orders for this visit:  Type 2 diabetes mellitus with other specified complication, without long-term current use of insulin (HCC) -     Semaglutide, 2 MG/DOSE, 8 MG/3ML SOPN; Inject 2 mg as directed once a week. -     POCT UA - Microalbumin  Rheumatoid arthritis 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 (HCC)  Chronic right-sided low back pain without sciatica  Chronic right hip pain  DDD (degenerative disc disease), lumbar  Overweight (BMI 25.0-29.9)  Personal history UTI -     POCT URINALYSIS DIP (CLINITEK)  Encounter for immunization  Need for influenza vaccination -     Flu vaccine trivalent PF, 6mos and older(Flulaval,Afluria,Fluarix,Fluzone)   Last A1c was to goal when checked with another provider at 5.5 Pt would like to increase to help with weight loss Increased to 2mg  weekly BP to goal Normal microalbumin On statin, LDL to goal under 70. Foot exam done today UTD eye exam Flu shot given today, declined covid vaccine Follow up in 3 months  Discussed labs. LDL to goal. HDL could be better and TG were a little high.  Increasing ozempic and changing up diet to less sugar/carbs/processed can help. Stay on same dose of statin for now.   Discuss cogenta and BH medications with that provider     Return in about 3 months (around 12/11/2023).    Tandy Gaw, PA-C

## 2023-09-11 NOTE — Patient Instructions (Signed)
Increased ozempic to 2mg  weekly Ok to take norco 1.5 tablets as needed for pain.

## 2023-09-14 ENCOUNTER — Encounter (HOSPITAL_COMMUNITY): Payer: Self-pay

## 2023-09-14 ENCOUNTER — Encounter: Payer: Self-pay | Admitting: Urology

## 2023-09-14 ENCOUNTER — Ambulatory Visit (HOSPITAL_COMMUNITY): Payer: MEDICAID | Admitting: Mental Health

## 2023-09-14 DIAGNOSIS — F900 Attention-deficit hyperactivity disorder, predominantly inattentive type: Secondary | ICD-10-CM | POA: Diagnosis not present

## 2023-09-14 DIAGNOSIS — F319 Bipolar disorder, unspecified: Secondary | ICD-10-CM

## 2023-09-14 DIAGNOSIS — M0609 Rheumatoid arthritis without rheumatoid factor, multiple sites: Secondary | ICD-10-CM | POA: Insufficient documentation

## 2023-09-14 DIAGNOSIS — F411 Generalized anxiety disorder: Secondary | ICD-10-CM | POA: Diagnosis not present

## 2023-09-14 DIAGNOSIS — Z87440 Personal history of urinary (tract) infections: Secondary | ICD-10-CM | POA: Insufficient documentation

## 2023-09-14 NOTE — Progress Notes (Unsigned)
   THERAPIST PROGRESS NOTE  Session Time: 11:03 am ( 50 minutes)  Participation Level: Active  Behavioral Response: CasualAlertNeutral  Type of Therapy: Individual Therapy  Treatment Goals addressed: STG: [name] will increase control over thoughts AEB ability to engage in x 3 coping skills with ability to reframe thoughts as needed within the next 90 days.   ProgressTowards Goals: Initial  Interventions: Supportive  Summary: ALETHIA GOSHORN is a 47 y.o. female who presents with dx of Bipolar disorder, borderline personality disorder, GAD and ADHD. Presents for session alert and oriented; mood and affect blunted; stable. Speech clear and coherent at normal rate and tone. Shares for moods to have been stable and reports to be medication complaint. Notes to be spending time fostering cats. Shares to have also recently attended a horse show and shares to enjoy riding horses. Notes to have been denied disability and reports for one lawyer to have suggested another Clinical research associate. Reports has attempted to work with VR in the past and explores with therapist possible options if she did have to return to work. Shares unable to work as she is in pain and shares unable to pick up heavy items. Shares thoughts of father and reports to have thoughts of him in which she wishes she did not. Shares with therapist hx of relationship with father and " I disowned him when I was 41." Denies having much contact with family members and mother to be deceased. Shares would like to work on processing unwanted to thoughts of father. Denies SI/HI. Initial goal.    Suicidal/Homicidal: Nowithout intent/plan  Therapist Response: Therapist engaged [name] in tele-therapy session. Completed check in and reviewed current level of functioning, sxs management and level of stressors. Provided supportive encouragement; validated feelings. Engaged [name] in exploring current stressors and provided feedback. Explored options for disability  and option to explore ability to re-engage wit work with denials. Educated on supported employment services. Assessed current stability in moods and daily routine. Supported in processing thoughts of father and desire to not think about him. Reviewed session and provided follow up. No safety concerns reported.   Plan: Return again in  x 5 weeks.  Diagnosis: Bipolar I disorder (HCC)  Attention deficit hyperactivity disorder (ADHD), predominantly inattentive type  Generalized anxiety disorder  Collaboration of Care: Other None  Patient/Guardian was advised Release of Information must be obtained prior to any record release in order to collaborate their care with an outside provider. Patient/Guardian was advised if they have not already done so to contact the registration department to sign all necessary forms in order for [place] to release information regarding their care.   Consent: Patient/Guardian gives verbal consent for treatment and assignment of benefits for services provided during this visit. Patient/Guardian expressed understanding and agreed to proceed.   Stephan Minister [place], [facility] 09/16/2023

## 2023-09-16 ENCOUNTER — Telehealth (HOSPITAL_COMMUNITY): Payer: Self-pay | Admitting: *Deleted

## 2023-09-16 NOTE — Telephone Encounter (Signed)
Patient called asking if she is suppose to take Cogentin. Went over her medication list and explained that the note states it was ineffective for her and no longer on her current list. She would like to talk with her psychiatrist to find out why it was removed.

## 2023-09-17 NOTE — Telephone Encounter (Signed)
Provider called patient however she did not pick up.  Provider left a message informing her that Cogentin was discontinued in the past because she found it in effective.  She is now managed on Ingrezza.  Provider informed patient that she does not need to restart Cogentin.  No other concerns noted at this time.

## 2023-09-23 ENCOUNTER — Ambulatory Visit: Payer: MEDICAID | Admitting: Physician Assistant

## 2023-09-23 ENCOUNTER — Encounter: Payer: Self-pay | Admitting: Physician Assistant

## 2023-09-23 ENCOUNTER — Ambulatory Visit: Payer: MEDICAID

## 2023-09-23 ENCOUNTER — Other Ambulatory Visit (HOSPITAL_COMMUNITY)
Admission: RE | Admit: 2023-09-23 | Discharge: 2023-09-23 | Disposition: A | Discharge status: Home / Self Care | Payer: MEDICAID | Source: Ambulatory Visit | Attending: Physician Assistant | Admitting: Physician Assistant

## 2023-09-23 VITALS — BP 129/79 | HR 95 | Ht 67.0 in | Wt 190.0 lb

## 2023-09-23 DIAGNOSIS — Z01419 Encounter for gynecological examination (general) (routine) without abnormal findings: Secondary | ICD-10-CM | POA: Diagnosis present

## 2023-09-23 DIAGNOSIS — S67193A Crushing injury of left middle finger, initial encounter: Secondary | ICD-10-CM

## 2023-09-23 DIAGNOSIS — Z23 Encounter for immunization: Secondary | ICD-10-CM | POA: Diagnosis not present

## 2023-09-23 NOTE — Progress Notes (Signed)
No fracture or dislocation.

## 2023-09-23 NOTE — Progress Notes (Signed)
Established Patient Office Visit  Subjective   Patient ID: Frances [name], female    DOB: [birth year]  Age: 47 y.o. MRN: [id number]  Chief Complaint  Patient presents with   Laceration    Left hand middle finger lac doi: last week    HPI Pt is a 47 yo female who presents to the clinic to get pap smear.   A few days ago her left hand and more specifically her left middle finger got jammed into a horse feed bucket. It conitnues to hurt and PIP joint is swollen. She wonders if it is fractured.   .. 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   Lumbar spondylosis 02/10/2008   Back pain 10/13/2008   FIBROMYALGIA 02/10/2008   Other specified abnormal findings of blood chemistry 02/10/2008   Bipolar I disorder (HCC) 09/16/2017   Chronic fatigue syndrome 09/16/2017   MTHFR gene mutation 09/20/2017   New daily persistent headache 09/26/2017   Seronegative rheumatoid arthritis (HCC) 12/09/2017   Borderline personality disorder (HCC) 01/22/2018   Elevated fasting glucose 04/08/2018   Grief reaction 11/28/2019   Labral tear of right hip joint, degenerative 01/11/2020   DDD (degenerative disc disease), cervical 03/13/2020   Ingrown right big toenail 03/13/2020   Recurrent major depressive disorder, in partial remission (HCC) 05/24/2020   Generalized anxiety disorder 05/24/2020   Symptomatic mammary hypertrophy 09/04/2020   Bilateral leg edema 11/19/2020   Seasonal allergies 12/25/2020   Diabetes mellitus (HCC) 03/13/2021   Hyperlipidemia LDL goal <70 03/18/2021   Attention deficit hyperactivity disorder (ADHD), 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   Overweight (BMI 25.0-29.9) 06/18/2021   SOB (shortness of breath) on exertion 08/20/2021   ETD (Eustachian tube dysfunction), bilateral 08/23/2021   Chronic right hip pain 01/06/2022   Acute pain of right shoulder 01/06/2022   Proteinuria 03/17/2022   DDD (degenerative disc  disease), lumbar 04/07/2022   Night sweats 06/18/2022   Microalbuminuria 09/09/2022   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   Crush injury of right foot 05/25/2023   Chronic pain syndrome 06/10/2023   Urinary frequency 06/16/2023   OAB (overactive bladder) 06/16/2023   Urinary hesitancy 06/30/2023   Personal history UTI 09/14/2023   Rheumatoid arthritis 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 (HCC) 09/14/2023   Resolved Ambulatory Problems    Diagnosis Date Noted   FEVER, RECURRENT 04/02/2009   Hypothyroidism 02/10/2008   SINUSITIS- ACUTE-NOS 03/24/2008   Acute upper respiratory infection 01/17/2009   DERMATITIS, ALLERGIC 06/05/2008   ANKLE PAIN, BILATERAL 07/28/2008   FATIGUE 05/05/2008   Open wound of foot excluding toes 03/28/2009   DYSPNEA 10/15/2011   Fibromyalgia 09/16/2017   TMJ (temporomandibular joint syndrome) 09/26/2017   Anxiety 01/22/2018   Depression    Bipolar 1 disorder, depressed, severe (HCC) 01/25/2018   Decreased visual acuity 08/30/2018   Right corneal abrasion 08/30/2018   Toenail fungus 09/09/2019   Vasovagal response 09/09/2019   Chronic right-sided low back pain without sciatica 01/11/2020   Macromastia 06/29/2020   Xiphoid pain 08/13/2020   Sternum pain 08/13/2020   Class 1 obesity due to excess calories without serious comorbidity with body mass index (BMI) of 32.0 to 32.9 in adult 08/13/2020   Costochondritis 10/22/2020   Rib pain on right side 10/22/2020   Tachycardia 10/22/2020   Hepatomegaly 10/22/2020   Chest congestion 11/20/2020   Sinus drainage 11/20/2020   Ear popping, bilateral 08/20/2021   Right lateral epicondylitis 12/31/2021   Weak urine  stream 03/17/2022   Acute left-sided low back pain without sciatica 04/07/2022   Acute midline low back pain without sciatica 04/07/2022   Irritant contact dermatitis due to plants, except food 06/18/2022   Ingrown toenail of right foot 09/09/2022   Chronic bilateral low back pain  without sciatica 09/25/2022   Past Medical History:  Diagnosis Date   Arthritis    Back pain, chronic    Bipolar 1 disorder (HCC)    Diabetes mellitus without complication (HCC)    IUD 2012     ROS See HPI.    Objective:     BP 129/79   Pulse 95   Ht 5\' 7"  (1.702 m)   Wt 190 lb (86.2 kg)   SpO2 99%   BMI 29.76 kg/m  BP Readings from Last 3 Encounters:  09/23/23 129/79  09/11/23 112/77  08/13/23 125/77   Wt Readings from Last 3 Encounters:  09/23/23 190 lb (86.2 kg)  09/11/23 188 lb (85.3 kg)  08/13/23 188 lb (85.3 kg)      Physical Exam Constitutional:      Appearance: Normal appearance.  HENT:     Head: Normocephalic.  Cardiovascular:     Rate and Rhythm: Normal rate and regular rhythm.  Pulmonary:     Effort: Pulmonary effort is normal.     Breath sounds: Normal breath sounds.  Genitourinary:    General: Normal vulva.     Vagina: No vaginal discharge.     Rectum: Normal.     Comments: No adnexal masses, no cervical polyps Musculoskeletal:     Right lower leg: No edema.     Left lower leg: No edema.     Comments: Left middle PIP joint swollen and painful No external open injuries  Neurological:     General: No focal deficit present.     Mental Status: She is alert.  Psychiatric:        Mood and Affect: Mood normal.       The 10-year ASCVD risk score (Arnett DK, et al., 2019) is: 1.9%    Assessment & Plan:  Marland KitchenMarland KitchenErsa "[name]" was seen today for laceration.  Diagnoses and all orders for this visit:  Pap smear, as part of routine gynecological examination -     Cytology - PAP  Immunization due -     Pfizer Comirnaty Covid -19 Vaccine 43yrs and older  Crushing injury of left middle finger, initial encounter -     DG Finger Middle Left; Future    Pap done today No concerns  Covid vaccine given  Will xray finger  Discussed ice and continue to splint  Follow up with Dr. Karie Schwalbe if symptoms/swelling/pain continues      Tandy Gaw, PA-C

## 2023-09-25 NOTE — Progress Notes (Signed)
She can still splint/buddy tape above and below the joint that hurts to adjacent finger for the next week until swelling improves.

## 2023-09-28 ENCOUNTER — Other Ambulatory Visit: Payer: Self-pay | Admitting: Sports Medicine

## 2023-09-28 DIAGNOSIS — M47816 Spondylosis without myelopathy or radiculopathy, lumbar region: Secondary | ICD-10-CM

## 2023-09-29 LAB — CYTOLOGY - PAP
Comment: NEGATIVE
Diagnosis: NEGATIVE
High risk HPV: NEGATIVE

## 2023-09-29 NOTE — Progress Notes (Signed)
Normal pap. No abnormal cells and negative HPV. Next pap 5 years.

## 2023-10-01 ENCOUNTER — Telehealth: Payer: Self-pay | Admitting: Family Medicine

## 2023-10-01 DIAGNOSIS — M47816 Spondylosis without myelopathy or radiculopathy, lumbar region: Secondary | ICD-10-CM

## 2023-10-01 NOTE — Telephone Encounter (Signed)
Patient is following up on her refills for [facility] 200mg   Please submit to  [facility] [phone]

## 2023-10-02 MED ORDER — PREGABALIN 200 MG PO CAPS
ORAL_CAPSULE | ORAL | 3 refills | Status: DC
Start: 2023-10-02 — End: 2024-02-19

## 2023-10-02 NOTE — Telephone Encounter (Signed)
Not a problem, resent

## 2023-10-02 NOTE — Telephone Encounter (Signed)
These were sent to her pharmacy, [place], approximately 4 days ago.  Does she needs me to send it again?

## 2023-10-02 NOTE — Addendum Note (Signed)
Addended by: Monica Becton on: 10/02/2023 03:42 PM   Modules accepted: Orders

## 2023-10-06 NOTE — Unmapped (Signed)
[facility] Refill Coordination Note    Specialty Medication(s) to be Shipped:   Inflammatory Disorders: Actemra    Other medication(s) to be shipped: No additional medications requested for fill at this time     Alice Howell, DOB: 1976/07/02  Phone: There are no phone numbers on file.      All above HIPAA information was verified with patient.     Was a Nurse, learning disability used for this call? No    Completed refill call assessment today to schedule patient's medication shipment from the [facility]  [phone]).  All relevant notes have been reviewed.     Specialty medication(s) and dose(s) confirmed: Regimen is correct and unchanged.   Changes to medications: [name] reports no changes at this time.  Changes to insurance: No  New side effects reported not previously addressed with a pharmacist or physician: None reported  Questions for the pharmacist: No    Confirmed patient received a Conservation officer, historic buildings and a Surveyor, mining with first shipment. The patient will receive a drug information handout for each medication shipped and additional FDA Medication Guides as required.       DISEASE/MEDICATION-SPECIFIC INFORMATION        For patients on injectable medications: Patient currently has 1 doses left.  Next injection is scheduled for 10/17.    SPECIALTY MEDICATION ADHERENCE     Medication Adherence    Patient reported X missed doses in the last month: 0  Specialty Medication: ACTEMRA ACTPEN 162 mg/0.9 mL Pnij (tocilizumab)  Patient is on additional specialty medications: No              Were doses missed due to medication being on hold? No    Actemra actpend 162mg /0.43ml  : 1 doses of medicine on hand       REFERRAL TO PHARMACIST     Referral to the pharmacist: Not needed      [facility]     Shipping address confirmed in Epic.       Delivery Scheduled: Yes, Expected medication delivery date: 10/22.     Medication will be delivered via UPS to the prescription address in Epic WAM.    Westley Gambles   [facility]  Specialty Technician

## 2023-10-09 ENCOUNTER — Ambulatory Visit (INDEPENDENT_AMBULATORY_CARE_PROVIDER_SITE_OTHER): Payer: MEDICAID | Admitting: Plastic Surgery

## 2023-10-09 ENCOUNTER — Encounter: Payer: Self-pay | Admitting: Plastic Surgery

## 2023-10-09 VITALS — BP 119/83 | HR 108 | Ht 67.0 in | Wt 181.6 lb

## 2023-10-09 DIAGNOSIS — M546 Pain in thoracic spine: Secondary | ICD-10-CM

## 2023-10-09 DIAGNOSIS — M542 Cervicalgia: Secondary | ICD-10-CM | POA: Diagnosis not present

## 2023-10-09 DIAGNOSIS — M0609 Rheumatoid arthritis without rheumatoid factor, multiple sites: Secondary | ICD-10-CM

## 2023-10-09 DIAGNOSIS — G8929 Other chronic pain: Secondary | ICD-10-CM

## 2023-10-09 DIAGNOSIS — Z6828 Body mass index (BMI) 28.0-28.9, adult: Secondary | ICD-10-CM

## 2023-10-09 DIAGNOSIS — F3341 Major depressive disorder, recurrent, in partial remission: Secondary | ICD-10-CM

## 2023-10-09 DIAGNOSIS — F603 Borderline personality disorder: Secondary | ICD-10-CM

## 2023-10-09 DIAGNOSIS — F319 Bipolar disorder, unspecified: Secondary | ICD-10-CM

## 2023-10-09 DIAGNOSIS — Z803 Family history of malignant neoplasm of breast: Secondary | ICD-10-CM

## 2023-10-09 DIAGNOSIS — M47816 Spondylosis without myelopathy or radiculopathy, lumbar region: Secondary | ICD-10-CM

## 2023-10-09 DIAGNOSIS — N62 Hypertrophy of breast: Secondary | ICD-10-CM | POA: Diagnosis not present

## 2023-10-09 NOTE — Progress Notes (Signed)
Patient ID: Frances [name], female    DOB: 01-Jul-1976, 47 y.o.   MRN: [id number]   Chief Complaint  Patient presents with   Advice Only    Mammary Hyperplasia: The patient is a 47 y.o. female with a history of mammary hyperplasia for several years.  She has extremely large breasts causing symptoms that include the following: Back pain in the upper and lower back, including neck pain. She pulls or pins her bra straps to provide better lift and relief of the pressure and pain. She notices relief by holding her breast up manually.  Her shoulder straps cause grooves and pain and pressure that requires padding for relief. Pain medication is sometimes required with motrin and tylenol.  Activities that are hindered by enlarged breasts include: exercise and running.  She has tried supportive clothing as well as fitted bras without improvement.  Her breasts are extremely large and fairly symmetric.  She has hyperpigmentation of the inframammary area on both sides.  The sternal to nipple distance on the right is 32 cm and the left is 35 cm.  The IMF distance is 20 cm.  She is 5 feet 7 inches tall and weighs 181 pounds.  The BMI = 28.3 kg/m.  Preoperative bra size = Likely DDD cup.  The estimated excess breast tissue to be removed at the time of surgery = 630 grams on the left and 630 grams on the right.  Mammogram history: 2024 and negative.  Family history of breast cancer:  maternal aunt.  Tobacco use:  none.   The patient expresses the desire to pursue surgical intervention. She has DM and her last HgbA1C was 5.6. She had PT at [facility] in the last 2 years without improvement.  The patient is on a weekly injection for her RA.       Review of Systems  Constitutional:  Positive for activity change. Negative for appetite change.  HENT: Negative.    Eyes: Negative.   Respiratory: Negative.    Gastrointestinal: Negative.   Endocrine: Negative.   Genitourinary: Negative.   Musculoskeletal:   Positive for back pain and neck pain.  Skin:  Positive for rash.    Past Medical History:  Diagnosis Date   Anxiety    Arthritis    Back pain, chronic    Bipolar 1 disorder (HCC)    Chronic fatigue syndrome    DDD (degenerative disc disease), lumbar    Depression    Diabetes mellitus without complication (HCC)    Fibromyalgia    IUD 2012   Mirena   MTHFR gene mutation     Past Surgical History:  Procedure Laterality Date   COLONOSCOPY WITH PROPOFOL N/A 02/25/2017   Procedure: COLONOSCOPY WITH PROPOFOL;  Surgeon: Willis Modena, MD;  Location: [facility];  Service: Endoscopy;  Laterality: N/A;   left knee lateral release  1993   scar tisue removal  03/2005   left ankle   TONSILLECTOMY  age 28's      Current Outpatient Medications:    albuterol (VENTOLIN HFA) 108 (90 Base) MCG/ACT inhaler, Take 2 puffs 15 minutes before exercise and as needed for shortness of breath., Disp: 6.7 g, Rfl: 1   ARIPiprazole (ABILIFY) 15 MG tablet, Take 1 tablet (15 mg total) by mouth daily., Disp: 30 tablet, Rfl: 3   atorvastatin (LIPITOR) 20 MG tablet, TAKE 1 TABLET BY MOUTH DAILY, Disp: 60 tablet, Rfl: 0   Azelastine HCl 137 MCG/SPRAY SOLN, USE 2 SPRAYS ALTERNATE NOSTRILS  TWICE DAILY, Disp: 30 mL, Rfl: 2   baclofen (LIORESAL) 10 MG tablet, Take 1 tablet (10 mg total) by mouth 2 (two) times daily as needed for muscle spasms., Disp: 180 each, Rfl: 1   blood glucose meter kit and supplies KIT, Dispense based on patient and insurance preference. Use up to four times daily as directed., Disp: 1 each, Rfl: 0   buPROPion (WELLBUTRIN XL) 300 MG 24 hr tablet, Take 1 tablet (300 mg total) by mouth daily., Disp: 30 tablet, Rfl: 3   cholecalciferol (VITAMIN D3) 25 MCG (1000 UT) tablet, Take 1,000 Units by mouth daily., Disp: , Rfl:    diclofenac (VOLTAREN) 75 MG EC tablet, Take 1 tablet (75 mg total) by mouth 2 (two) times daily., Disp: 60 tablet, Rfl: 0   DULoxetine HCl 30 MG CSDR, Take 90 mg by mouth daily.,  Disp: , Rfl:    fluticasone (FLONASE) 50 MCG/ACT nasal spray, 2 SPRAYS IN EACH NOSTRIL DAILY, Disp: 16 g, Rfl: 0   HYDROcodone-acetaminophen (NORCO/VICODIN) 5-325 MG tablet, Take 1 tablet by mouth 2 (two) times daily as needed for moderate pain (back pain)., Disp: 60 tablet, Rfl: 0   hydroxychloroquine (PLAQUENIL) 200 MG tablet, Take 200 mg by mouth 2 (two) times daily. 200mg  twice daily on weekdays and 200mg  once daily on weekends as of 12/03/22 review, Disp: , Rfl:    levocetirizine (XYZAL) 5 MG tablet, TAKE 1 TABLET BY MOUTH EVERY EVENING, Disp: 90 tablet, Rfl: 1   montelukast (SINGULAIR) 10 MG tablet, Take 1 tablet (10 mg total) by mouth at bedtime., Disp: 90 tablet, Rfl: 3   pregabalin (LYRICA) 200 MG capsule, TAKE 1 CAPSULE BY MOUTH 2-3 TIMES DAILY, Disp: 90 capsule, Rfl: 3   Semaglutide, 2 MG/DOSE, 8 MG/3ML SOPN, Inject 2 mg as directed once a week., Disp: 9 mL, Rfl: 0   solifenacin (VESICARE) 10 MG tablet, Take 1 tablet (10 mg total) by mouth daily., Disp: 30 tablet, Rfl: 11   Tocilizumab (ACTEMRA) 162 MG/0.9ML SOSY, Inject 162 mg into the skin once a week., Disp: , Rfl:    Objective:   Vitals:   10/09/23 1111  BP: 119/83  Pulse: (!) 108  SpO2: 96%    Physical Exam Vitals reviewed.  Constitutional:      Appearance: Normal appearance.  HENT:     Head: Normocephalic and atraumatic.  Cardiovascular:     Rate and Rhythm: Normal rate.     Pulses: Normal pulses.  Pulmonary:     Effort: Pulmonary effort is normal.  Abdominal:     General: There is no distension.     Palpations: Abdomen is soft.  Musculoskeletal:        General: No swelling or deformity.     Cervical back: Normal range of motion.  Skin:    Capillary Refill: Capillary refill takes less than 2 seconds.  Neurological:     Mental Status: She is alert and oriented to person, place, and time.  Psychiatric:        Mood and Affect: Mood normal.        Behavior: Behavior normal.        Thought Content: Thought  content normal.        Judgment: Judgment normal.     Assessment & Plan:  Lumbar spondylosis  Rheumatoid arthritis 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 (HCC)  Bipolar I disorder (HCC)  Symptomatic mammary hypertrophy  Recurrent major depressive disorder, in partial remission (HCC)  Borderline personality disorder (HCC)  Chronic bilateral thoracic back pain  Patient will need to be off her RA injection for one month before surgery and 6 weeks after surgery.  She will need to talk with her doctor ahead of time for direction.  Plan for bilateral breast reduction with liposuction.  Pictures were obtained of the patient and placed in the chart with the patient's or guardian's permission.   Alena Bills Diante Barley, DO

## 2023-10-12 MED FILL — ACTEMRA ACTPEN 162 MG/0.9 ML SUBCUTANEOUS PEN INJECTOR: SUBCUTANEOUS | 28 days supply | Qty: 3.6 | Fill #1

## 2023-10-16 ENCOUNTER — Encounter: Payer: Self-pay | Admitting: Sports Medicine

## 2023-10-16 ENCOUNTER — Ambulatory Visit (INDEPENDENT_AMBULATORY_CARE_PROVIDER_SITE_OTHER): Payer: MEDICAID | Admitting: Sports Medicine

## 2023-10-16 DIAGNOSIS — M79645 Pain in left finger(s): Secondary | ICD-10-CM

## 2023-10-16 MED ORDER — DICLOFENAC SODIUM 1 % EX GEL
2.0000 g | Freq: Four times a day (QID) | CUTANEOUS | 11 refills | Status: AC
Start: 2023-10-16 — End: ?

## 2023-10-16 NOTE — Assessment & Plan Note (Addendum)
Jammed left third PIP approximately 8 weeks ago, she did see one of my partners, x-rays were read as negative, she has had conservative treatment for greater than 6 weeks and has first the pain, on exam she has fusiform swelling left third PIP, tenderness to palpation, collaterals are stable, good motion, good strength. Proceeding with MRI, buddy taping, topical Voltaren in addition to her oral Voltaren, return to see me for MRI results.

## 2023-10-16 NOTE — Progress Notes (Signed)
    Procedures performed today:    None.  Independent interpretation of notes and tests performed by another provider:   X-ray personally reviewed, there is potentially a small avulsion from the radial aspect of the distal third proximal phalanx.  Brief History, Exam, Impression, and Recommendations:    Pain in left third proximal interphalangeal joint Jammed left third PIP approximately 8 weeks ago, she did see one of my partners, x-rays were read as negative, she has had conservative treatment for greater than 6 weeks and has first the pain, on exam she has fusiform swelling left third PIP, tenderness to palpation, collaterals are stable, good motion, good strength. Proceeding with MRI, buddy taping, topical Voltaren in addition to her oral Voltaren, return to see me for MRI results.    ____________________________________________ Ihor Austin. Benjamin Stain, M.D., ABFM., CAQSM., AME. Primary Care and Sports Medicine [facility]  Adjunct Professor of Family Medicine  [employer]  Restaurant manager, fast food

## 2023-10-19 ENCOUNTER — Other Ambulatory Visit: Payer: Self-pay | Admitting: Physician Assistant

## 2023-10-19 DIAGNOSIS — M51369 Other intervertebral disc degeneration, lumbar region without mention of lumbar back pain or lower extremity pain: Secondary | ICD-10-CM

## 2023-10-19 DIAGNOSIS — G8929 Other chronic pain: Secondary | ICD-10-CM

## 2023-10-19 DIAGNOSIS — M545 Low back pain, unspecified: Secondary | ICD-10-CM

## 2023-10-19 DIAGNOSIS — M0609 Rheumatoid arthritis without rheumatoid factor, multiple sites: Secondary | ICD-10-CM

## 2023-10-19 NOTE — Telephone Encounter (Signed)
Prescription Request  10/19/2023  LOV: 09/23/2023  What is the name of the medication or equipment? HYDROcodone-acetaminophen (NORCO/VICODIN) 5-325 MG tablet   Have you contacted your pharmacy to request a refill? Yes   Which pharmacy would you like this sent to?   [id number] - [address] [place] - [address] [place] [address] Phone: [phone] Fax: [phone]   Patient notified that their request is being sent to the clinical staff for review and that they should receive a response within 2 business days.   Please advise at [facility] [phone]

## 2023-10-20 ENCOUNTER — Encounter: Payer: Self-pay | Admitting: Plastic Surgery

## 2023-10-20 ENCOUNTER — Other Ambulatory Visit: Payer: Self-pay | Admitting: Physician Assistant

## 2023-10-20 ENCOUNTER — Encounter: Payer: Self-pay | Admitting: Sports Medicine

## 2023-10-20 DIAGNOSIS — G8929 Other chronic pain: Secondary | ICD-10-CM

## 2023-10-20 DIAGNOSIS — M545 Low back pain, unspecified: Secondary | ICD-10-CM

## 2023-10-20 DIAGNOSIS — M51369 Other intervertebral disc degeneration, lumbar region without mention of lumbar back pain or lower extremity pain: Secondary | ICD-10-CM

## 2023-10-20 DIAGNOSIS — M0609 Rheumatoid arthritis without rheumatoid factor, multiple sites: Secondary | ICD-10-CM

## 2023-10-20 MED ORDER — HYDROCODONE-ACETAMINOPHEN 5-325 MG PO TABS: 1.0000 | ORAL_TABLET | Freq: Two times a day (BID) | ORAL | 0 refills | Status: DC | PRN

## 2023-10-20 NOTE — Telephone Encounter (Signed)
Requesting rx rf hydrocodone - acet  Last written 08/14/2023 Last OV10/01/2023 Next schld appt 12/11/2023

## 2023-10-22 ENCOUNTER — Other Ambulatory Visit: Payer: Self-pay

## 2023-10-22 ENCOUNTER — Telehealth: Payer: Self-pay

## 2023-10-22 DIAGNOSIS — M79645 Pain in left finger(s): Secondary | ICD-10-CM

## 2023-10-22 NOTE — Telephone Encounter (Signed)
Faxed surgical clearance form to patient's pcp: Tandy Gaw, PA-C-[phone], with confirmed receipt. Will scan into patient's chart.

## 2023-10-26 ENCOUNTER — Ambulatory Visit (HOSPITAL_COMMUNITY): Payer: MEDICAID | Admitting: Mental Health

## 2023-10-26 DIAGNOSIS — F900 Attention-deficit hyperactivity disorder, predominantly inattentive type: Secondary | ICD-10-CM | POA: Diagnosis not present

## 2023-10-26 DIAGNOSIS — F319 Bipolar disorder, unspecified: Secondary | ICD-10-CM | POA: Diagnosis not present

## 2023-10-26 DIAGNOSIS — F603 Borderline personality disorder: Secondary | ICD-10-CM

## 2023-10-26 NOTE — Progress Notes (Addendum)
   THERAPIST PROGRESS NOTE Virtual Visit via Video Note  I connected with Ulanda Edison on 10/26/2023 at  3:00 PM EST by a video enabled telemedicine application and verified that I am speaking with the correct person using two identifiers.  Location: Patient: home address on file Provider: home office   I discussed the limitations of evaluation and management by telemedicine and the availability of in person appointments. The patient expressed understanding and agreed to proceed.  I discussed the assessment and treatment plan with the patient. The patient was provided an opportunity to ask questions and all were answered. The patient agreed with the plan and demonstrated an understanding of the instructions.   The patient was advised to call back or seek an in-person evaluation if the symptoms worsen or if the condition fails to improve as anticipated.  I provided 30 minutes of non-face-to-face time during this encounter.   Dorris Singh, [facility]   Session Time: 3:05 pm ( 33 minutes)   Participation Level: Active  Behavioral Response: CasualAlertDysphoric  Type of Therapy: Individual Therapy  Treatment Goals addressed: STG: [name] will increase control over thoughts AEB ability to engage in x 3 coping skills with ability to reframe thoughts as needed within the next 90 days.   ProgressTowards Goals: Progressing  Interventions: Supportive  Summary: MUNNI CONLEY is a 47 y.o. female who presents with dx of Bipolar disorder, borderline personality disorder, GAD and ADHD. Presents for session alert and oriented; mood and affect blunted; stable. Speech clear and coherent at normal rate and tone. Shares for moods to have been stable and reports to be medication complaint.Shares some feelings of depression with being denied for disability and working to re-apply. Feels she will have better luck when she turns 47 years of age next year. Shares use of coping skills with  engaging with horses, coloring and doing puzzles. Notes thoughts on working from home and possibly able to work once she is able to having breast reduction, noting to be in a lot of pain in her back. Shares concern for work from home positions and being scammed. Notes medication compliance and use of coping skills. Denies safety concerns.    Suicidal/Homicidal: Nowithout intent/plan  Therapist Response: Therapist engaged [name] in tele-therapy session. Completed check in and reviewed current level of functioning, sxs management and level of stressors. Provided supportive encouragement; validated feelings. Engaged [name] in processing feelings of depression and factors. Reviewed use of coping skills and barriers to increasing organization in home and barriers of working. Provided support and encouragement; validated feelings. Encouraged ongoing use of coping skills and engaging in balance thoughts. Reviewed session and provided follow up.   Plan: Return again in  x 8 weeks.  Diagnosis: Bipolar I disorder (HCC)  Attention deficit hyperactivity disorder (ADHD), predominantly inattentive type  Borderline personality disorder (HCC)  Collaboration of Care: Other None  Patient/Guardian was advised Release of Information must be obtained prior to any record release in order to collaborate their care with an outside provider. Patient/Guardian was advised if they have not already done so to contact the registration department to sign all necessary forms in order for [place] to release information regarding their care.   Consent: Patient/Guardian gives verbal consent for treatment and assignment of benefits for services provided during this visit. Patient/Guardian expressed understanding and agreed to proceed.   Stephan Minister Darwin, [facility] 10/26/2023

## 2023-10-27 ENCOUNTER — Other Ambulatory Visit: Payer: Self-pay

## 2023-10-27 DIAGNOSIS — R350 Frequency of micturition: Secondary | ICD-10-CM

## 2023-10-28 ENCOUNTER — Encounter: Payer: Self-pay | Admitting: Plastic Surgery

## 2023-10-28 ENCOUNTER — Other Ambulatory Visit: Payer: Self-pay | Admitting: Urology

## 2023-10-28 ENCOUNTER — Other Ambulatory Visit: Payer: MEDICAID

## 2023-10-28 DIAGNOSIS — R350 Frequency of micturition: Secondary | ICD-10-CM

## 2023-10-28 DIAGNOSIS — R8290 Unspecified abnormal findings in urine: Secondary | ICD-10-CM

## 2023-10-29 LAB — MICROSCOPIC EXAMINATION: RBC, Urine: NONE SEEN /[HPF] (ref 0–2)

## 2023-10-29 LAB — URINALYSIS, ROUTINE W REFLEX MICROSCOPIC
Bilirubin, UA: NEGATIVE
Glucose, UA: NEGATIVE
Ketones, UA: NEGATIVE
Nitrite, UA: NEGATIVE
Protein,UA: NEGATIVE
RBC, UA: NEGATIVE
Specific Gravity, UA: 1.01 (ref 1.005–1.030)
Urobilinogen, Ur: 0.2 mg/dL (ref 0.2–1.0)
pH, UA: 6 (ref 5.0–7.5)

## 2023-10-31 ENCOUNTER — Encounter: Payer: Self-pay | Admitting: Urology

## 2023-11-02 ENCOUNTER — Other Ambulatory Visit: Payer: Self-pay | Admitting: Physician Assistant

## 2023-11-02 DIAGNOSIS — M542 Cervicalgia: Secondary | ICD-10-CM

## 2023-11-02 DIAGNOSIS — J3489 Other specified disorders of nose and nasal sinuses: Secondary | ICD-10-CM

## 2023-11-02 DIAGNOSIS — R0989 Other specified symptoms and signs involving the circulatory and respiratory systems: Secondary | ICD-10-CM

## 2023-11-02 DIAGNOSIS — J310 Chronic rhinitis: Secondary | ICD-10-CM

## 2023-11-02 LAB — URINE CULTURE

## 2023-11-02 MED ORDER — NITROFURANTOIN MONOHYD MACRO 100 MG PO CAPS: 100.0000 mg | ORAL_CAPSULE | Freq: Two times a day (BID) | ORAL | 0 refills | Status: AC

## 2023-11-02 NOTE — Addendum Note (Signed)
Addended by: Milderd Meager on: 11/02/2023 12:19 PM   Modules accepted: Orders

## 2023-11-04 NOTE — Unmapped (Signed)
[facility] Delivery Pharm[facility] Refill Coordination Note    Specialty Medication(s) to be Shipped:   Inflammatory Disorders: Actemra    Other medication(s) to be shipped: No additional medications requested for fill at this time     Alice Howell, DOB: [birth year]  Phone: There are no phone numbers on file.      All above HIPAA information was verified with patient.     Was a Nurse, learning disability used for this call? No    Completed refill call assessment today to schedule patient's medication shipment from the [facility] Delivery Pharm[facility]  [phone]).  All relevant notes have been reviewed.     Specialty medication(s) and dose(s) confirmed: Regimen is correct and unchanged.   Changes to medications: Harkirat reports no changes at this time.  Changes to insurance: No  New side effects reported not previously addressed with a pharmacist or physician: None reported  Questions for the pharmacist: No    Confirmed patient received a Conservation officer, historic buildings and a Surveyor, mining with first shipment. The patient will receive a drug information handout for each medication shipped and additional FDA Medication Guides as required.       DISEASE/MEDICATION-SPECIFIC INFORMATION        For patients on injectable medications: Patient currently has 0 doses left.  Next injection is scheduled for 11/10/23.    SPECIALTY MEDICATION ADHERENCE     Medication Adherence    Patient reported X missed doses in the last month: 0  Specialty Medication: ACTEMRA ACTPEN 162 mg/0.9 mL Pnij (tocilizumab)  Patient is on additional specialty medications: No  Patient is on more than two specialty medications: No  Any gaps in refill history greater than 2 weeks in the last 3 months: no  Demonstrates understanding of importance of adherence: yes  Informant: patient  Reliability of informant: reliable  Provider-estimated medication adherence level: good  Patient is at risk for Non-Adherence: No  Reasons for non-adherence: no problems identified              Were doses missed due to medication being on hold? No    ACTEMRA ACTPEN 162 mg/0.9 mL Pnij (tocilizumab)  : 0 days of medicine on hand       REFERRAL TO PHARMACIST     Referral to the pharmacist: Not needed      [facility]     Shipping address confirmed in Epic.       Delivery Scheduled: Yes, Expected medication delivery date: 11/06/23.     Medication will be delivered via UPS to the prescription address in Epic WAM.    [facility]  Specialty Technician

## 2023-11-05 MED FILL — ACTEMRA ACTPEN 162 MG/0.9 ML SUBCUTANEOUS PEN INJECTOR: SUBCUTANEOUS | 28 days supply | Qty: 3.6 | Fill #2

## 2023-11-08 ENCOUNTER — Ambulatory Visit: Payer: MEDICAID

## 2023-11-08 DIAGNOSIS — M79645 Pain in left finger(s): Secondary | ICD-10-CM | POA: Diagnosis not present

## 2023-11-11 ENCOUNTER — Encounter (HOSPITAL_COMMUNITY): Payer: Self-pay | Admitting: Psychiatry

## 2023-11-11 ENCOUNTER — Telehealth (INDEPENDENT_AMBULATORY_CARE_PROVIDER_SITE_OTHER): Payer: MEDICAID | Admitting: Psychiatry

## 2023-11-11 DIAGNOSIS — F900 Attention-deficit hyperactivity disorder, predominantly inattentive type: Secondary | ICD-10-CM

## 2023-11-11 DIAGNOSIS — G2401 Drug induced subacute dyskinesia: Secondary | ICD-10-CM

## 2023-11-11 DIAGNOSIS — F319 Bipolar disorder, unspecified: Secondary | ICD-10-CM

## 2023-11-11 MED ORDER — ARIPIPRAZOLE 15 MG PO TABS: 15.0000 mg | ORAL_TABLET | Freq: Every day | ORAL | 3 refills | Status: DC

## 2023-11-11 MED ORDER — BUPROPION HCL ER (XL) 300 MG PO TB24: 300.0000 mg | ORAL_TABLET | Freq: Every day | ORAL | 3 refills | Status: DC

## 2023-11-11 MED ORDER — BENZTROPINE MESYLATE 0.5 MG PO TABS: 0.5000 mg | ORAL_TABLET | Freq: Two times a day (BID) | ORAL | 3 refills | Status: DC

## 2023-11-11 NOTE — Progress Notes (Signed)
BH MD/PA/NP OP Progress Note Virtual Visit via Video Note  I connected with Frances [name] on 11/11/23 at  3:00 PM EST by a video enabled telemedicine application and verified that I am speaking with the correct person using two identifiers.  Location: Patient: Home Provider: Clinic   I discussed the limitations of evaluation and management by telemedicine and the availability of in person appointments. The patient expressed understanding and agreed to proceed.  I provided 30 minutes of non-face-to-face time during this encounter.              11/11/2023 3:15 PM Frances [name]  MRN:  [id number]  Chief Complaint: "I have shaking in my hands"  HPI: 47 year old female seen today for follow up psychiatric evaluation. She has a psychiatric history of bipolar 1, borderline personality disorder, depression, and anxiety. She is currently managed on Abilify 15 mg nightly,  Wellbutrin 300 we will mg daily, and Cymbalta 90 mg daily (from rheumatologist). Patient reports that she has been also taking cogentin 0.5 mg twice daily. Today, patient notes that medications are effective in managing her symptoms.  Today the patient was well-groomed, pleasant, cooperative, engaged in eye contact, and engaged in conversation. She informed Clinical research associate that her at times she has shaking in her hands. She notes that she recently dropped her phone and  abnormal muscle movements.  Patient notes that she restarted Cogentin and reports that it is somewhat effective with her abnormal hand movements.  Provider also encouraged patient to discuss this with her rheumatologist to make sure she is not having weakness in her hand due to orthopedic issues.  She endorsed understanding and agreed.   Since her last visit she notes that her anxiety depression continues to be well-managed.  At this time I did not conduct a GAD-7 or PHQ-9.  She notes adequate sleep and appetite.  Today she denies SI/HI/VAH, mania,  paranoia.    Patient informed Clinical research associate that she is excited because she will be getting a breast reduction in January.  Today provider reordered Cogentin 0.5 mg twice daily.  She will continue all other medications as prescribed.  No other concerns at this time.      Visit Diagnosis:    ICD-10-CM   1. Tardive dyskinesia  G24.01 benztropine (COGENTIN) 0.5 MG tablet    2. Bipolar I disorder (HCC)  F31.9 ARIPiprazole (ABILIFY) 15 MG tablet    buPROPion (WELLBUTRIN XL) 300 MG 24 hr tablet    3. Attention deficit hyperactivity disorder (ADHD), predominantly inattentive type  F90.0 buPROPion (WELLBUTRIN XL) 300 MG 24 hr tablet        Past Psychiatric History: Bipolar, anxiety, and depression Past Medical History:  Past Medical History:  Diagnosis Date   Anxiety    Arthritis    Back pain, chronic    Bipolar 1 disorder (HCC)    Chronic fatigue syndrome    DDD (degenerative disc disease), lumbar    Depression    Diabetes mellitus without complication (HCC)    Fibromyalgia    IUD 2012   Mirena   MTHFR gene mutation     Past Surgical History:  Procedure Laterality Date   COLONOSCOPY WITH PROPOFOL N/A 02/25/2017   Procedure: COLONOSCOPY WITH PROPOFOL;  Surgeon: Willis Modena, MD;  Location: [facility];  Service: Endoscopy;  Laterality: N/A;   left knee lateral release  1993   scar tisue removal  03/2005   left ankle   TONSILLECTOMY  age 74's    Family  Psychiatric History: Maternal aunts Bipolar disorder and uncle alcoholic  Family History:  Family History  Problem Relation Age of Onset   Diabetes Mother    Stroke Mother    Lupus Mother    Hypertension Mother    Congestive Heart Failure Mother    Mental illness Brother        Not clear what his diagnosis is--may be related to previous drug use.   Cancer Maternal Grandmother        colon   Diabetes Maternal Grandfather    Depression Maternal Uncle    Alcohol abuse Maternal Uncle     Social History:  Social History    Socioeconomic History   Marital status: Single    Spouse name: Not on file   Number of children: 0   Years of education: Not on file   Highest education level: Bachelor's degree (e.g., BA, AB, BS)  Occupational History   Occupation: Airline pilot at times  Tobacco Use   Smoking status: Never   Smokeless tobacco: Never  Vaping Use   Vaping status: Never Used  Substance and Sexual Activity   Alcohol use: No    Alcohol/week: 0.0 standard drinks of alcohol   Drug use: No   Sexual activity: Not on file  Other Topics Concern   Not on file  Social History Narrative   Originally from [place], outside of [place].    Moved here permanently 2012.   Intermittently worked on a farm.   Lives with many cats--3 plus fosters cats.    Single, lives alone in a one story home. Rarely drinks caffeine. 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   Social Determinants of Health   Financial Resource Strain: Medium Risk (06/12/2023)   Overall Financial Resource Strain (CARDIA)    Difficulty of Paying Living Expenses: Somewhat hard  Food Insecurity: Food Insecurity Present (06/12/2023)   Hunger Vital Sign    Worried About Running Out of Food in the Last Year: Sometimes true    Ran Out of Food in the Last Year: Never true  Transportation Needs: No Transportation Needs (06/12/2023)   PRAPARE - Administrator, Civil Service (Medical): No    Lack of Transportation (Non-Medical): No  Physical Activity: Insufficiently Active (07/27/2023)   Exercise Vital Sign    Days of Exercise per Week: 3 days    Minutes of Exercise per Session: 30 min  Stress: No Stress Concern Present (06/12/2023)   [employer] of Occupational Health - Occupational Stress Questionnaire    Feeling of Stress : Only a little  Social Connections: Socially Isolated (06/12/2023)   Social Connection and Isolation Panel [NHANES]    Frequency of Communication with Friends and Family: Never     Frequency of Social Gatherings with Friends and Family: Twice a week    Attends Religious Services: Never    Database administrator or Organizations: Yes    Attends Engineer, structural: More than 4 times per year    Marital Status: Never married    Allergies:  Allergies  Allergen Reactions   Metaxalone Hives    Metabolic Disorder Labs: Lab Results  Component Value Date   HGBA1C 5.5 09/02/2023   MPG 103 04/14/2018   Lab Results  Component Value Date   PROLACTIN 16.4 09/02/2023   Lab Results  Component Value Date   CHOL 132 09/02/2023   TRIG 201 (H) 09/02/2023   HDL 39 (L) 09/02/2023   CHOLHDL 3.4 09/02/2023  LDLCALC 60 09/02/2023   LDLCALC 41 09/10/2022   Lab Results  Component Value Date   TSH 1.030 09/02/2023   TSH 0.866 11/21/2020    Therapeutic Level Labs: No results found for: "LITHIUM" No results found for: "VALPROATE" No results found for: "CBMZ"  Current Medications: Current Outpatient Medications  Medication Sig Dispense Refill   benztropine (COGENTIN) 0.5 MG tablet Take 1 tablet (0.5 mg total) by mouth 2 (two) times daily. 60 tablet 3   albuterol (VENTOLIN HFA) 108 (90 Base) MCG/ACT inhaler Take 2 puffs 15 minutes before exercise and as needed for shortness of breath. 6.7 g 1   ARIPiprazole (ABILIFY) 15 MG tablet Take 1 tablet (15 mg total) by mouth daily. 30 tablet 3   atorvastatin (LIPITOR) 20 MG tablet TAKE 1 TABLET BY MOUTH DAILY 60 tablet 0   Azelastine HCl 137 MCG/SPRAY SOLN USE 2 SPRAYS ALTERNATE NOSTRILS TWICE A DAY 30 mL 2   baclofen (LIORESAL) 10 MG tablet TAKE 1 TABLET BY MOUTH TWICE A DAY AS NEEDED FOR MUSCLE SPASMS 180 tablet 1   blood glucose meter kit and supplies KIT Dispense based on patient and insurance preference. Use up to four times daily as directed. 1 each 0   buPROPion (WELLBUTRIN XL) 300 MG 24 hr tablet Take 1 tablet (300 mg total) by mouth daily. 30 tablet 3   cholecalciferol (VITAMIN D3) 25 MCG (1000 UT) tablet Take  1,000 Units by mouth daily.     diclofenac (VOLTAREN) 75 MG EC tablet Take 1 tablet (75 mg total) by mouth 2 (two) times daily. 60 tablet 0   diclofenac Sodium (VOLTAREN) 1 % GEL Apply 2 g topically 4 (four) times daily. To affected joint. 100 g 11   DULoxetine HCl 30 MG CSDR Take 90 mg by mouth daily.     fluticasone (FLONASE) 50 MCG/ACT nasal spray USE 2 SPRAYS IN EACH NOSRIL DAILY 16 g 0   HYDROcodone-acetaminophen (NORCO/VICODIN) 5-325 MG tablet Take 1 tablet by mouth 2 (two) times daily as needed for moderate pain (pain score 4-6) (back pain). 60 tablet 0   hydroxychloroquine (PLAQUENIL) 200 MG tablet Take 200 mg by mouth 2 (two) times daily. 200mg  twice daily on weekdays and 200mg  once daily on weekends as of 12/03/22 review     levocetirizine (XYZAL) 5 MG tablet TAKE 1 TABLET BY MOUTH EVERY EVENING 90 tablet 1   montelukast (SINGULAIR) 10 MG tablet Take 1 tablet (10 mg total) by mouth at bedtime. 90 tablet 3   pregabalin (LYRICA) 200 MG capsule TAKE 1 CAPSULE BY MOUTH 2-3 TIMES DAILY 90 capsule 3   Semaglutide, 2 MG/DOSE, 8 MG/3ML SOPN Inject 2 mg as directed once a week. 9 mL 0   solifenacin (VESICARE) 10 MG tablet Take 1 tablet (10 mg total) by mouth daily. 30 tablet 11   Tocilizumab (ACTEMRA) 162 MG/0.9ML SOSY Inject 162 mg into the skin once a week.     No current facility-administered medications for this visit.     Musculoskeletal: Strength & Muscle Tone: within normal limits and  telehealth visit Gait & Station: normal, telehealth visit Patient leans: N/A  Psychiatric Specialty Exam: Review of Systems  There were no vitals taken for this visit.There is no height or weight on file to calculate BMI.  General Appearance: Well Groomed  Eye Contact:  Good  Speech:  Clear and Coherent and Normal Rate  Volume:  Normal  Mood:  Euthymic  Affect:  Congruent  Thought Process:  Coherent, Goal Directed  and Linear  Orientation:  Full (Time, Place, and Person)  Thought Content: WDL  and Logical   Suicidal Thoughts:  No  Homicidal Thoughts:  No  Memory:  Immediate;   Good Recent;   Good Remote;   Good  Judgement:  Good  Insight:  Good  Psychomotor Activity:  Normal  Concentration:  Concentration: Good and Attention Span: Good  Recall:  Good  Fund of Knowledge: Good  Language: Good  Akathisia:  No  Handed:  Right  AIMS (if indicated): not done  Assets:  Communication Skills Desire for Improvement Financial Resources/Insurance Housing Social Support  ADL's:  Intact  Cognition: WNL  Sleep:  Good   Screenings: AIMS    Flowsheet Row Clinical Support from 06/30/2023 in [facility]  AIMS Total Score 5 0      AUDIT    Flowsheet Row [facility]  Alcohol Use Disorder Identification Test Final Score (AUDIT) 0      GAD-7    Flowsheet Row Office Visit from 09/11/2023 in [facility] at [facility] Video Visit from 09/10/2023 in [facility] Counselor from 07/27/2023 in [facility] Clinical Support from 06/30/2023 in [facility] Office Visit from 06/10/2023 in [facility] at [facility]  Total GAD-7 Score 2 3 4 2  0      PHQ2-9    Flowsheet Row Office Visit from 09/11/2023 in [facility] Primary [facility] & Sports Medicine at [facility] Video Visit from 09/10/2023 in [facility] Counselor from 07/27/2023 in [facility] Office Visit from 07/03/2023 in [facility] Primary Care & [facility] at [facility] Clinical Support from 06/30/2023 in [facility]  PHQ-2 Total Score 2 1 2 2 2   PHQ-9 Total Score 9 8 10  -- 9       Flowsheet Row Video Visit from 09/10/2023 in [facility] Counselor from 07/27/2023 in [facility] ED from 11/08/2022 in [facility] Emergency Department at [facility]  C-SSRS RISK CATEGORY No Risk No Risk No Risk        Assessment and Plan: Patient notes that he is doing well on his current medication regimen.  She reports that she restarted Cogentin.  Today provider reordered Cogentin 0.5 mg twice daily.  He will continue all other medications as prescribed.    1. Bipolar I disorder (HCC)  Continue continue- ARIPiprazole (ABILIFY) 15 MG tablet; Take 1 tablet (15 mg total) by mouth daily.  Dispense: 30 tablet; Refill: 3 Continue- buPROPion (WELLBUTRIN XL) 300 MG 24 hr tablet; Take 1 tablet (300 mg total) by mouth daily.  Dispense: 30 tablet; Refill: 3  2. Attention deficit hyperactivity disorder (ADHD), predominantly inattentive type  Continue- buPROPion (WELLBUTRIN XL) 300 MG 24 hr tablet; Take 1 tablet (300 mg total) by mouth daily.  Dispense: 30 tablet; Refill: 3  3. Tardive dyskinesia  Restart- benztropine (COGENTIN) 0.5 MG tablet; Take 1 tablet (0.5 mg total) by mouth 2 (two) times daily.  Dispense: 60 tablet; Refill: 3     Follow up in 2.5 moths Follow up with therapy   Shanna Cisco, NP 11/11/2023, 3:15 PM

## 2023-11-17 ENCOUNTER — Encounter: Payer: Self-pay | Admitting: Plastic Surgery

## 2023-11-24 ENCOUNTER — Telehealth: Payer: Self-pay

## 2023-11-24 NOTE — Telephone Encounter (Signed)
Copied from CRM [id number]. Topic: Clinical - Lab/Test Results >> Nov 23, 2023  1:34 PM Alvino Blood C wrote:  Reason for CRM: PT calling for results of MRI.

## 2023-11-24 NOTE — Telephone Encounter (Signed)
Please review previous note.   Msg sent to imaging to change pending results to a STAT reading. Attempted to contact the patient with an update on the delay. No answer, left a detailed vm msg.

## 2023-11-27 ENCOUNTER — Ambulatory Visit
Admit: 2023-11-27 | Discharge: 2023-11-28 | Payer: MEDICARE | Attending: Student in an Organized Health Care Education/Training Program | Primary: Student in an Organized Health Care Education/Training Program

## 2023-11-27 DIAGNOSIS — M1990 Unspecified osteoarthritis, unspecified site: Principal | ICD-10-CM

## 2023-11-27 DIAGNOSIS — M0600 Rheumatoid arthritis without rheumatoid factor, unspecified site: Principal | ICD-10-CM

## 2023-11-27 LAB — CREATININE
CREATININE: 0.92 mg/dL (ref 0.55–1.02)
EGFR CKD-EPI (2021) FEMALE: 76 mL/min/{1.73_m2} (ref >=60)

## 2023-11-27 LAB — CBC W/ AUTO DIFF
BASOPHILS ABSOLUTE COUNT: 0 10*9/L (ref 0.0–0.1)
BASOPHILS RELATIVE PERCENT: 1.2 %
EOSINOPHILS ABSOLUTE COUNT: 0.1 10*9/L (ref 0.0–0.5)
EOSINOPHILS RELATIVE PERCENT: 2 %
HEMATOCRIT: 39.8 % (ref 34.0–44.0)
HEMOGLOBIN: 14.1 g/dL (ref 11.3–14.9)
LYMPHOCYTES ABSOLUTE COUNT: 1.5 10*9/L (ref 1.1–3.6)
LYMPHOCYTES RELATIVE PERCENT: 48.4 %
MEAN CORPUSCULAR HEMOGLOBIN CONC: 35.5 g/dL (ref 32.0–36.0)
MEAN CORPUSCULAR HEMOGLOBIN: 33.2 pg — ABNORMAL HIGH (ref 25.9–32.4)
MEAN CORPUSCULAR VOLUME: 93.5 fL (ref 77.6–95.7)
MEAN PLATELET VOLUME: 8.2 fL (ref 6.8–10.7)
MONOCYTES ABSOLUTE COUNT: 0.3 10*9/L (ref 0.3–0.8)
MONOCYTES RELATIVE PERCENT: 9.8 %
NEUTROPHILS ABSOLUTE COUNT: 1.2 10*9/L — ABNORMAL LOW (ref 1.8–7.8)
NEUTROPHILS RELATIVE PERCENT: 38.6 %
PLATELET COUNT: 153 10*9/L (ref 150–450)
RED BLOOD CELL COUNT: 4.26 10*12/L (ref 3.95–5.13)
RED CELL DISTRIBUTION WIDTH: 13.2 % (ref 12.2–15.2)
WBC ADJUSTED: 3.1 10*9/L — ABNORMAL LOW (ref 3.6–11.2)

## 2023-11-27 LAB — HEPATIC FUNCTION PANEL
ALBUMIN: 4 g/dL (ref 3.4–5.0)
ALKALINE PHOSPHATASE: 74 U/L (ref 46–116)
ALT (SGPT): 161 U/L — ABNORMAL HIGH (ref 10–49)
AST (SGOT): 67 U/L — ABNORMAL HIGH (ref <=34)
BILIRUBIN DIRECT: 0.7 mg/dL — ABNORMAL HIGH (ref 0.00–0.30)
BILIRUBIN TOTAL: 2.2 mg/dL — ABNORMAL HIGH (ref 0.3–1.2)
PROTEIN TOTAL: 6.5 g/dL (ref 5.7–8.2)

## 2023-11-27 NOTE — Unmapped (Signed)
Rheumatology Clinic Follow-up Note     Assessment/Plan:    Alice Howell is a 47 y.o.  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 She couldn't tolerate MTX or leflunomide.     At recent visits, I repeated her POC [place] and noted ongoing mild MCP/PIP effusions without CDS or other inflammatory features. Discussed that effusions are nonspecific and I was concerned her sympotoms were more OA driven. Nonetheless we agreed on a trial of Acemtra, which she initially felt was helpful but now is having a return of her symptoms despite an MR of her fingers without an inflammatory process.    We discussed that I currently feel her symptoms are OA/mechanical driven. She is to stop the Actemra and not restart it after her surgery. Reasonable to continue HCQ for now.    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   - Stop Actemra  - Continue Diclofenac PRN. Advised pt to take with food.   - Last hand XR 02/2022: no new erosions compared to 09/2019 (noted subchondral cysts)  - CBC w/ diff, Cr, hepatic panel     Fibromyalgia with severe depression:   - Cymbalta 90mg  daily and lyrica 75mg  BID (Rx by me)  - Abilify 10mg  (Rx by others)    Return in about 6 months (around 05/27/2024).     Fleeta Emmer, MD  Assistant Professor of Medicine  Department of Medicine/Division of Rheumatology  [facility]    Primary Care Provider: Hedy Jacob, [facility]    HPI:  Alice Howell is a 47 y.o.  female with a PMHx of fibromyalgia, degenerative disc disease, bipolar and/or borderline personality disorder, and homzygous MTHFR mutation who presents today for follow-up of hand arthritis, likely due to osteoarthritis +/- mild seronegative inflammatory arthritis based on ultrasound in August 2021 showing R 3rd MCP osteophytosis and erosions, and prior ultrasound in 2018 w/ MCP effusions.    Today  Feeling poorly today despite regular use of Actemra, does not feel like she is getting the benefit she previously had. Planning on undergoing a breast reduction surgery, plan is told hold Actemra. Otherwise remains on HCQ.    Disease History: She first established care in October 2018 for bilateral hand pain. Due to minimal synovitis on exam she has a diagnostic ultrasound in Dec 2018 which showed evidence of active inflammatory arthritis with erosive changes and effusions in the hands and MCPs. She was started on MTX but could not tolerate it due to concern for homozygous MTHFR state. She later tried [place], but had GI symptoms with this. In April 2019 she was switched to Plaquenil 200mg  BID, without minimal response. She was started on Humira in October 2019 and increased to weekly dosing in Feb 2020. Humira was stopped in July 2020 as it had not been effective. She was started on Xeljanz in Jan 2021 when ultrasound showed MCP synovitis/effusion, which was stopped in April 2021 as she had  no benefit. 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 Xrays have not shown chondrocalcinosis. She had a hand [place] in August 2021 with osteophytes and erosions but no signs of inflammation, so she was continued on NSAIDs and Plaquenil was stopped.     Review of Systems:  Positive findings noted above, otherwise a 14 point review of systems was reviewed and negative    Past Medical, Surgical, Family and Social History reviewed and updated per EMR     Allergies:  Metaxalone    Medications:     Current Outpatient Medications:     ARIPiprazole (ABILIFY) 10 MG tablet, Take 1 tablet (10 mg total) by mouth daily., Disp: , Rfl:     atorvastatin (LIPITOR) 10 MG tablet, Take 1 tablet (10 mg total) by mouth daily., Disp: , Rfl:     azelastine (ASTELIN) 137 mcg (0.1 %) nasal spray, 1 spray into each nostril two (2) times a day. Use in each nostril as directed, Disp: , Rfl:     baclofen (LIORESAL) 10 MG tablet, Take 1 tablet (10 mg total) by mouth two (2) times a day as needed for muscle spasm., Disp: 60 tablet, Rfl: 0    benztropine (COGENTIN) 0.5 MG tablet, Take 1 tablet (0.5 mg total) by mouth daily with evening meal., Disp: , Rfl:     buPROPion (WELLBUTRIN XL) 300 MG 24 hr tablet, Take 1 tablet (300 mg total) by mouth daily. Take 450 mg daily, Disp: , Rfl:     cholecalciferol, vitamin D3, (VITAMIN D3 ORAL), Take 5,000 Units by mouth daily. , Disp: , Rfl:     diclofenac (VOLTAREN) 75 MG EC tablet, Take 1 tablet (75 mg total) by mouth two (2) times a day., Disp: 180 tablet, Rfl: 3    DULoxetine (CYMBALTA) 30 MG capsule, Take 3 capsules (90 mg total) by mouth daily., Disp: 270 capsule, Rfl: 3    empty container Misc, USE AS DIRECTED, Disp: 1 each, Rfl: 2    fluticasone propionate (FLONASE) 50 mcg/actuation nasal spray, USE 2 SPRAYS IN EACH NOSTRIL EVERY DAY, Disp: , Rfl:     HYDROcodone-acetaminophen (NORCO) 5-325 mg per tablet, Take 1 tablet by mouth every four (4) hours as needed., Disp: , Rfl:     hydroxychloroquine (PLAQUENIL) 200 mg tablet, Take 1 tablet (200 mg total) by mouth Two (2) times a day., Disp: 180 tablet, Rfl: 3    levocetirizine (XYZAL) 5 MG tablet, Take 1 tablet (5 mg total) by mouth every evening., Disp: , Rfl:     montelukast (SINGULAIR) 10 mg tablet, Take 1 tablet (10 mg total) by mouth nightly., Disp: , Rfl:     pregabalin (LYRICA) 100 MG capsule, Take 1 capsule (100 mg total) by mouth two (2) times a day. 2-3 times daily, Disp: , Rfl:     semaglutide (OZEMPIC) 0.25 mg or 0.5 mg(2 mg/1.5 mL) PnIj injection, Inject 2 mg under the skin every seven (7) days., Disp: , Rfl:     solifenacin (VESICARE) 10 MG tablet, Take 1 tablet (10 mg total) by mouth daily., Disp: , Rfl:     tocilizumab (ACTEMRA ACTPEN) 162 mg/0.9 mL PnIj, Inject the contents of 1 pen (162 mg total) under the skin every seven (7) days., Disp: 3.6 mL, Rfl: 2    albuterol HFA 90 mcg/actuation inhaler, Take 2 puffs 15 minutes before exercise and as needed  for shortness of breath. (Patient not taking: Reported on 11/27/2023), Disp: , Rfl:       Objective   Vitals:    11/27/23 1310   BP: 108/75   BP Site: L Arm   BP Cuff Size: Large   Pulse: 93   Temp: 36.2 ??C (97.2 ??F)   Weight: 82.7 kg (182 lb 6.4 oz)             Physical Exam  General: well appearing, no acute distress  HEENT: EOMI, normal conjunctivae, MMM, no oral ulcers.  Cardiovascular: Radial pulses 2+ and symmetric, regular.  Pulmonary: Nl WOB on RA, CTAB.  Skin: No rash, lesions, breakdown. No purpura or petechiae. No digital ulcers.   Extremities: Warm and well perfused, no cyanosis, clubbing or edema  Musculoskeletal: Mild TTP predominately over trapezius and paraspinal muscles, some epicondyles and greater trochanters. No synovitis or focal tenderness to palpation in the hands, wrists, elbows, shoulders, knees, ankles, or feet. Full ROM throughout.   Neurologic: Cranial nerves grossly intact, strength 5/5 throughout.  Normal sensation  Psychiatric: Normal mood and affect.    I personally spent 36 minutes face-to-face and non-face-to-face in the care of this patient, which includes all pre, intra, and post visit time on the date of service.  All documented time was specific to the E/M visit and does not include any procedures that may have been performed.

## 2023-11-30 ENCOUNTER — Other Ambulatory Visit: Payer: Self-pay | Admitting: Physician Assistant

## 2023-11-30 DIAGNOSIS — E1169 Type 2 diabetes mellitus with other specified complication: Secondary | ICD-10-CM

## 2023-12-02 DIAGNOSIS — T50Z15A Adverse effect of immunoglobulin, initial encounter: Principal | ICD-10-CM

## 2023-12-05 ENCOUNTER — Ambulatory Visit
Admission: EM | Admit: 2023-12-05 | Discharge: 2023-12-05 | Disposition: A | Discharge status: Home / Self Care | Payer: MEDICAID | Attending: Family Medicine | Admitting: Family Medicine

## 2023-12-05 ENCOUNTER — Other Ambulatory Visit: Payer: Self-pay

## 2023-12-05 DIAGNOSIS — M797 Fibromyalgia: Secondary | ICD-10-CM | POA: Diagnosis not present

## 2023-12-05 DIAGNOSIS — G894 Chronic pain syndrome: Secondary | ICD-10-CM | POA: Insufficient documentation

## 2023-12-05 DIAGNOSIS — R109 Unspecified abdominal pain: Secondary | ICD-10-CM | POA: Diagnosis not present

## 2023-12-05 DIAGNOSIS — M549 Dorsalgia, unspecified: Secondary | ICD-10-CM | POA: Insufficient documentation

## 2023-12-05 DIAGNOSIS — B9689 Other specified bacterial agents as the cause of diseases classified elsewhere: Secondary | ICD-10-CM | POA: Insufficient documentation

## 2023-12-05 DIAGNOSIS — N3000 Acute cystitis without hematuria: Secondary | ICD-10-CM | POA: Diagnosis not present

## 2023-12-05 LAB — POCT URINALYSIS DIP (MANUAL ENTRY)
Bilirubin, UA: NEGATIVE
Blood, UA: NEGATIVE
Glucose, UA: NEGATIVE mg/dL
Ketones, POC UA: NEGATIVE mg/dL
Nitrite, UA: POSITIVE — AB
Protein Ur, POC: NEGATIVE mg/dL
Spec Grav, UA: 1.02 (ref 1.010–1.025)
Urobilinogen, UA: 0.2 U/dL
pH, UA: 5.5 (ref 5.0–8.0)

## 2023-12-05 MED ORDER — CEFDINIR 300 MG PO CAPS: 300.0000 mg | ORAL_CAPSULE | Freq: Two times a day (BID) | ORAL | 0 refills | Status: AC

## 2023-12-05 NOTE — ED Provider Notes (Signed)
[facility]    CSN: [id number] Arrival date & time: 12/05/23  1020      History   Chief Complaint Chief Complaint  Patient presents with   Back Pain   Flank Pain    LT    HPI Frances [name] is a 47 y.o. female.   HPI 47 year old female presents with back pain and flank pain for 2 days.  Patient reports using Vicodin as needed.  PMH significant for chronic back pain, chronic fatigue syndrome, and fibromyalgia.  Past Medical History:  Diagnosis Date   Anxiety    Arthritis    Back pain, chronic    Bipolar 1 disorder (HCC)    Chronic fatigue syndrome    DDD (degenerative disc disease), lumbar    Depression    Diabetes mellitus without complication (HCC)    Fibromyalgia    IUD 2012   Mirena   MTHFR gene mutation     Patient Active Problem List   Diagnosis Date Noted   Pain in left third proximal interphalangeal joint 10/16/2023   Personal history UTI 09/14/2023   Rheumatoid arthritis 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 (HCC) 09/14/2023   Urinary hesitancy 06/30/2023   Urinary frequency 06/16/2023   OAB (overactive bladder) 06/16/2023   Chronic pain syndrome 06/10/2023   Crush injury of right foot 05/25/2023   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   Microalbuminuria 09/09/2022   Night sweats 06/18/2022   DDD (degenerative disc disease), lumbar 04/07/2022   Proteinuria 03/17/2022   Chronic right hip pain 01/06/2022   Acute pain of right shoulder 01/06/2022   ETD (Eustachian tube dysfunction), bilateral 08/23/2021   SOB (shortness of breath) on exertion 08/20/2021   Overweight (BMI 25.0-29.9) 06/18/2021   Attention deficit hyperactivity disorder (ADHD), 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   Hyperlipidemia LDL goal <70 03/18/2021   Diabetes mellitus (HCC) 03/13/2021   Seasonal allergies 12/25/2020   Bilateral leg edema 11/19/2020   Symptomatic mammary hypertrophy 09/04/2020   Recurrent major depressive disorder, in partial  remission (HCC) 05/24/2020   Generalized anxiety disorder 05/24/2020   DDD (degenerative disc disease), cervical 03/13/2020   Ingrown right big toenail 03/13/2020   Labral tear of right hip joint, degenerative 01/11/2020   Grief reaction 11/28/2019   Elevated fasting glucose 04/08/2018   Borderline personality disorder (HCC) 01/22/2018   Seronegative rheumatoid arthritis (HCC) 12/09/2017   New daily persistent headache 09/26/2017   MTHFR gene mutation 09/20/2017   Bipolar I disorder (HCC) 09/16/2017   Chronic fatigue syndrome 09/16/2017   Back pain 10/13/2008   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   Lumbar spondylosis 02/10/2008   FIBROMYALGIA 02/10/2008   Other specified abnormal findings of blood chemistry 02/10/2008    Past Surgical History:  Procedure Laterality Date   COLONOSCOPY WITH PROPOFOL N/A 02/25/2017   Procedure: COLONOSCOPY WITH PROPOFOL;  Surgeon: Willis Modena, MD;  Location: [facility];  Service: Endoscopy;  Laterality: N/A;   left knee lateral release  1993   scar tisue removal  03/2005   left ankle   TONSILLECTOMY  age 10's    OB History     Gravida  0   Para  0   Term  0   Preterm  0   AB  0   Living  0      SAB  0   IAB  0   Ectopic  0   Multiple  0   Live Births  Home Medications    Prior to Admission medications   Medication Sig Start Date End Date Taking? Authorizing Provider  cefdinir (OMNICEF) 300 MG capsule Take 1 capsule (300 mg total) by mouth 2 (two) times daily for 7 days. 12/05/23 12/12/23 Yes Trevor Iha, FNP  albuterol (VENTOLIN HFA) 108 (90 Base) MCG/ACT inhaler Take 2 puffs 15 minutes before exercise and as needed for shortness of breath. 08/27/21   Breeback, Jade L, PA-C  ARIPiprazole (ABILIFY) 15 MG tablet Take 1 tablet (15 mg total) by mouth daily. 11/11/23   Shanna Cisco, NP  atorvastatin (LIPITOR) 20 MG tablet TAKE 1 TABLET BY MOUTH DAILY 11/02/23   Breeback, Jade L, PA-C  Azelastine  HCl 137 MCG/SPRAY SOLN USE 2 SPRAYS ALTERNATE NOSTRILS TWICE A DAY 11/02/23   Breeback, Jade L, PA-C  baclofen (LIORESAL) 10 MG tablet TAKE 1 TABLET BY MOUTH TWICE A DAY AS NEEDED FOR MUSCLE SPASMS 11/02/23   Breeback, Jade L, PA-C  benztropine (COGENTIN) 0.5 MG tablet Take 1 tablet (0.5 mg total) by mouth 2 (two) times daily. 11/11/23   Shanna Cisco, NP  blood glucose meter kit and supplies KIT Dispense based on patient and insurance preference. Use up to four times daily as directed. 05/29/21   Breeback, Jade L, PA-C  buPROPion (WELLBUTRIN XL) 300 MG 24 hr tablet Take 1 tablet (300 mg total) by mouth daily. 11/11/23   Shanna Cisco, NP  cholecalciferol (VITAMIN D3) 25 MCG (1000 UT) tablet Take 1,000 Units by mouth daily.    [provider]  diclofenac (VOLTAREN) 75 MG EC tablet Take 1 tablet (75 mg total) by mouth 2 (two) times daily. 10/19/22   Eustace Moore, MD  diclofenac Sodium (VOLTAREN) 1 % GEL Apply 2 g topically 4 (four) times daily. To affected joint. 10/16/23   Monica Becton, MD  DULoxetine HCl 30 MG CSDR Take 90 mg by mouth daily.    [provider]  fluticasone (FLONASE) 50 MCG/ACT nasal spray USE 2 SPRAYS IN EACH NOSRIL DAILY 11/02/23   Breeback, Jade L, PA-C  HYDROcodone-acetaminophen (NORCO/VICODIN) 5-325 MG tablet Take 1 tablet by mouth 2 (two) times daily as needed for moderate pain (pain score 4-6) (back pain). 10/20/23   Breeback, Lonna Cobb, PA-C  hydroxychloroquine (PLAQUENIL) 200 MG tablet Take 200 mg by mouth 2 (two) times daily. 200mg  twice daily on weekdays and 200mg  once daily on weekends as of 12/03/22 review    [provider]  levocetirizine (XYZAL) 5 MG tablet TAKE 1 TABLET BY MOUTH EVERY EVENING 07/24/23   Breeback, Jade L, PA-C  montelukast (SINGULAIR) 10 MG tablet Take 1 tablet (10 mg total) by mouth at bedtime. 07/13/23   Breeback, Jade L, PA-C  OZEMPIC, 2 MG/DOSE, 8 MG/3ML SOPN INJECT 2 MG AS DIRECTED ONCE A WEEK.  12/01/23   Breeback, Jade L, PA-C  pregabalin (LYRICA) 200 MG capsule TAKE 1 CAPSULE BY MOUTH 2-3 TIMES DAILY 10/02/23   Monica Becton, MD  solifenacin (VESICARE) 10 MG tablet Take 1 tablet (10 mg total) by mouth daily. 08/13/23   Stoneking, Danford Bad., MD  Tocilizumab (ACTEMRA) 162 MG/0.9ML SOSY Inject 162 mg into the skin once a week.    [provider]    Family History Family History  Problem Relation Age of Onset   Diabetes Mother    Stroke Mother    Lupus Mother    Hypertension Mother    Congestive Heart Failure Mother    Mental illness Brother  Not clear what his diagnosis is--may be related to previous drug use.   Cancer Maternal Grandmother        colon   Diabetes Maternal Grandfather    Depression Maternal Uncle    Alcohol abuse Maternal Uncle     Social History Social History   Tobacco Use   Smoking status: Never   Smokeless tobacco: Never  Vaping Use   Vaping status: Never Used  Substance Use Topics   Alcohol use: No    Alcohol/week: 0.0 standard drinks of alcohol   Drug use: No     Allergies   Metaxalone   Review of Systems Review of Systems  Musculoskeletal:  Positive for back pain.  All other systems reviewed and are negative.    Physical Exam Triage Vital Signs ED Triage Vitals  Encounter Vitals Group     BP 12/05/23 1032 112/73     Systolic BP Percentile --      Diastolic BP Percentile --      Pulse Rate 12/05/23 1032 99     Resp 12/05/23 1032 17     Temp 12/05/23 1032 98.4 F (36.9 C)     Temp Source 12/05/23 1032 Oral     SpO2 12/05/23 1032 98 %     Weight --      Height --      Head Circumference --      Peak Flow --      Pain Score 12/05/23 1034 4     Pain Loc --      Pain Education --      Exclude from Growth Chart --    No data found.  Updated Vital Signs BP 112/73 (BP Location: Right Arm)   Pulse 99   Temp 98.4 F (36.9 C) (Oral)   Resp 17   SpO2 98%      Physical Exam Vitals and nursing  note reviewed.  Constitutional:      General: She is not in acute distress.    Appearance: Normal appearance. She is obese. She is not ill-appearing.  HENT:     Head: Normocephalic and atraumatic.     Mouth/Throat:     Mouth: Mucous membranes are moist.     Pharynx: Oropharynx is clear.  Eyes:     Extraocular Movements: Extraocular movements intact.     Conjunctiva/sclera: Conjunctivae normal.     Pupils: Pupils are equal, round, and reactive to light.  Cardiovascular:     Rate and Rhythm: Normal rate and regular rhythm.     Pulses: Normal pulses.     Heart sounds: Normal heart sounds.  Pulmonary:     Effort: Pulmonary effort is normal.     Breath sounds: Normal breath sounds. No wheezing, rhonchi or rales.  Abdominal:     Tenderness: There is no right CVA tenderness or left CVA tenderness.  Musculoskeletal:        General: Normal range of motion.     Cervical back: Normal range of motion and neck supple.  Skin:    General: Skin is warm and dry.  Neurological:     General: No focal deficit present.     Mental Status: She is alert and oriented to person, place, and time. Mental status is at baseline.  Psychiatric:        Mood and Affect: Mood normal.        Behavior: Behavior normal.      UC Treatments / Results  Labs (all labs ordered are listed,  but only abnormal results are displayed) Labs Reviewed  POCT URINALYSIS DIP (MANUAL ENTRY) - Abnormal; Notable for the following components:      Result Value   Nitrite, UA Positive (*)    Leukocytes, UA Trace (*)    All other components within normal limits  URINE CULTURE    EKG   Radiology No results found.  Procedures Procedures (including critical care time)  Medications Ordered in UC Medications - No data to display  Initial Impression / Assessment and Plan / UC Course  I have reviewed the triage vital signs and the nursing notes.  Pertinent labs & imaging results that were available during my care of the  patient were reviewed by me and considered in my medical decision making (see chart for details).     MDM: 1.  Acute cystitis without hematuria-UA revealed above, urine culture ordered, Rx'd cefdinir 300 mg capsule: Take 1 capsule twice daily x 7 days. Advised patient to take medication as directed with food to completion.  Encouraged increase daily water intake to 64 ounces per day while taking this medication.  Advised we will follow-up with urine culture results once received.  Advised if symptoms worsen and/or unresolved please follow-up PCP or here for further evaluation.  Patient discharged home, hemodynamically stable. Final Clinical Impressions(s) / UC Diagnoses   Final diagnoses:  Acute cystitis without hematuria     Discharge Instructions      Advised patient to take medication as directed with food to completion.  Encouraged increase daily water intake to 64 ounces per day while taking this medication.  Advised we will follow-up with urine culture results once received.  Advised if symptoms worsen and/or unresolved please follow-up PCP or here for further evaluation.     ED Prescriptions     Medication Sig Dispense Auth. Provider   cefdinir (OMNICEF) 300 MG capsule Take 1 capsule (300 mg total) by mouth 2 (two) times daily for 7 days. 14 capsule Trevor Iha, FNP      PDMP not reviewed this encounter.   Trevor Iha, FNP 12/05/23 [phone]

## 2023-12-05 NOTE — ED Triage Notes (Signed)
Pt c/o lower back pain and LT sided back pain as well x 2 days. Denies urinary sxs. No hx of kidney stones. Hx of arhtritis and DDD. Vicodin prn.

## 2023-12-05 NOTE — Discharge Instructions (Addendum)
Advised patient to take medication as directed with food to completion.  Encouraged increase daily water intake to 64 ounces per day while taking this medication.  Advised we will follow-up with urine culture results once received.  Advised if symptoms worsen and/or unresolved please follow-up PCP or here for further evaluation.

## 2023-12-07 LAB — URINE CULTURE: Culture: >=100000 — AB

## 2023-12-08 ENCOUNTER — Other Ambulatory Visit: Payer: Self-pay | Admitting: Physician Assistant

## 2023-12-08 DIAGNOSIS — G8929 Other chronic pain: Secondary | ICD-10-CM

## 2023-12-08 DIAGNOSIS — M51369 Other intervertebral disc degeneration, lumbar region without mention of lumbar back pain or lower extremity pain: Secondary | ICD-10-CM

## 2023-12-08 DIAGNOSIS — M545 Low back pain, unspecified: Secondary | ICD-10-CM

## 2023-12-08 DIAGNOSIS — M0609 Rheumatoid arthritis without rheumatoid factor, multiple sites: Secondary | ICD-10-CM

## 2023-12-08 MED ORDER — HYDROCODONE-ACETAMINOPHEN 5-325 MG PO TABS: 1.0000 | ORAL_TABLET | Freq: Two times a day (BID) | ORAL | 0 refills | Status: DC | PRN

## 2023-12-08 NOTE — Telephone Encounter (Signed)
..  PDMP reviewed during this encounter. No concerns.  Sent norco refill.

## 2023-12-09 ENCOUNTER — Ambulatory Visit (INDEPENDENT_AMBULATORY_CARE_PROVIDER_SITE_OTHER): Payer: MEDICAID | Admitting: Physician Assistant

## 2023-12-09 ENCOUNTER — Encounter: Payer: Self-pay | Admitting: Physician Assistant

## 2023-12-09 VITALS — BP 136/81 | HR 97 | Resp 14 | Ht 67.0 in | Wt 181.6 lb

## 2023-12-09 DIAGNOSIS — N3001 Acute cystitis with hematuria: Secondary | ICD-10-CM | POA: Diagnosis not present

## 2023-12-09 DIAGNOSIS — M545 Low back pain, unspecified: Secondary | ICD-10-CM | POA: Diagnosis not present

## 2023-12-09 DIAGNOSIS — E1169 Type 2 diabetes mellitus with other specified complication: Secondary | ICD-10-CM

## 2023-12-09 DIAGNOSIS — G894 Chronic pain syndrome: Secondary | ICD-10-CM

## 2023-12-09 LAB — POCT GLYCOSYLATED HEMOGLOBIN (HGB A1C): Hemoglobin A1C: 5.2 % (ref 4.0–5.6)

## 2023-12-09 MED ORDER — KETOROLAC TROMETHAMINE 60 MG/2ML IM SOLN
60.0000 mg | Freq: Once | INTRAMUSCULAR | Status: AC
  Administered 2023-12-09: 60 mg via INTRAMUSCULAR

## 2023-12-09 NOTE — Progress Notes (Signed)
Established Patient Office Visit  Subjective   Patient ID: Frances [name], female    DOB: [birth year]  Age: 47 y.o. MRN: [id number]  Chief Complaint  Patient presents with   Medication Refill    Follow up    HPI Pt is a 47 yo female with T2DM, Chronic pain due to RA/OA/Fibromyalgia who presents to the clinic for refills.   Pt just seen in UC with acute cystitis due to E.coli. she was treated with omnicef. She went to UC with worsening back and flank pain. Pain has improved some with omnicef.   She needs refills of norco due to pain contract.   She is not checking her sugars. She denies any hypoglycemic events. No CP, palpitations, headaches or vision changes. She is compliant with ozempic. She is active.    .. 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   Lumbar spondylosis 02/10/2008   Back pain 10/13/2008   FIBROMYALGIA 02/10/2008   Other specified abnormal findings of blood chemistry 02/10/2008   Bipolar I disorder (HCC) 09/16/2017   Chronic fatigue syndrome 09/16/2017   MTHFR gene mutation 09/20/2017   New daily persistent headache 09/26/2017   Seronegative rheumatoid arthritis (HCC) 12/09/2017   Borderline personality disorder (HCC) 01/22/2018   Elevated fasting glucose 04/08/2018   Grief reaction 11/28/2019   Labral tear of right hip joint, degenerative 01/11/2020   DDD (degenerative disc disease), cervical 03/13/2020   Ingrown right big toenail 03/13/2020   Recurrent major depressive disorder, in partial remission (HCC) 05/24/2020   Generalized anxiety disorder 05/24/2020   Symptomatic mammary hypertrophy 09/04/2020   Bilateral leg edema 11/19/2020   Seasonal allergies 12/25/2020   Diabetes mellitus (HCC) 03/13/2021   Hyperlipidemia LDL goal <70 03/18/2021   Attention deficit hyperactivity disorder (ADHD), 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   Overweight (BMI 25.0-29.9) 06/18/2021   SOB (shortness of  breath) on exertion 08/20/2021   ETD (Eustachian tube dysfunction), bilateral 08/23/2021   Chronic right hip pain 01/06/2022   Acute pain of right shoulder 01/06/2022   Proteinuria 03/17/2022   DDD (degenerative disc disease), lumbar 04/07/2022   Night sweats 06/18/2022   Microalbuminuria 09/09/2022   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   Crush injury of right foot 05/25/2023   Chronic pain syndrome 06/10/2023   Urinary frequency 06/16/2023   OAB (overactive bladder) 06/16/2023   Urinary hesitancy 06/30/2023   Personal history UTI 09/14/2023   Rheumatoid arthritis 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 (HCC) 09/14/2023   Pain in left third proximal interphalangeal joint 10/16/2023   Resolved Ambulatory Problems    Diagnosis Date Noted   FEVER, RECURRENT 04/02/2009   Hypothyroidism 02/10/2008   SINUSITIS- ACUTE-NOS 03/24/2008   Acute upper respiratory infection 01/17/2009   DERMATITIS, ALLERGIC 06/05/2008   ANKLE PAIN, BILATERAL 07/28/2008   FATIGUE 05/05/2008   Open wound of foot excluding toes 03/28/2009   DYSPNEA 10/15/2011   Fibromyalgia 09/16/2017   TMJ (temporomandibular joint syndrome) 09/26/2017   Anxiety 01/22/2018   Depression    Bipolar 1 disorder, depressed, severe (HCC) 01/25/2018   Decreased visual acuity 08/30/2018   Right corneal abrasion 08/30/2018   Toenail fungus 09/09/2019   Vasovagal response 09/09/2019   Chronic right-sided low back pain without sciatica 01/11/2020   Macromastia 06/29/2020   Xiphoid pain 08/13/2020   Sternum pain 08/13/2020   Class 1 obesity due to excess calories without serious comorbidity with body mass index (BMI) of 32.0 to 32.9 in adult 08/13/2020  Costochondritis 10/22/2020   Rib pain on right side 10/22/2020   Tachycardia 10/22/2020   Hepatomegaly 10/22/2020   Chest congestion 11/20/2020   Sinus drainage 11/20/2020   Ear popping, bilateral 08/20/2021   Right lateral epicondylitis 12/31/2021   Weak urine  stream 03/17/2022   Acute left-sided low back pain without sciatica 04/07/2022   Acute midline low back pain without sciatica 04/07/2022   Irritant contact dermatitis due to plants, except food 06/18/2022   Ingrown toenail of right foot 09/09/2022   Chronic bilateral low back pain without sciatica 09/25/2022   Past Medical History:  Diagnosis Date   Arthritis    Back pain, chronic    Bipolar 1 disorder (HCC)    Diabetes mellitus without complication (HCC)    IUD 2012     ROS See HPI.    Objective:     BP 136/81 (BP Location: Right Arm, Patient Position: Sitting)   Pulse 97   Resp 14   Ht 5\' 7"  (1.702 m)   Wt 181 lb 9.6 oz (82.4 kg)   SpO2 99%   BMI 28.44 kg/m  BP Readings from Last 3 Encounters:  12/09/23 136/81  12/05/23 112/73  10/09/23 119/83   Wt Readings from Last 3 Encounters:  12/09/23 181 lb 9.6 oz (82.4 kg)  10/09/23 181 lb 9.6 oz (82.4 kg)  09/23/23 190 lb (86.2 kg)      Physical Exam Constitutional:      Appearance: Normal appearance.  HENT:     Head: Normocephalic.  Cardiovascular:     Rate and Rhythm: Normal rate and regular rhythm.  Pulmonary:     Effort: Pulmonary effort is normal.     Breath sounds: Normal breath sounds.  Abdominal:     General: There is no distension.     Palpations: Abdomen is soft. There is no mass.     Tenderness: There is no abdominal tenderness. There is right CVA tenderness and left CVA tenderness. There is no guarding or rebound.     Hernia: No hernia is present.  Musculoskeletal:     Right lower leg: No edema.     Left lower leg: No edema.     Comments: Generally tender to touch over entire back and paraspinal muscles  Neurological:     General: No focal deficit present.     Mental Status: She is alert and oriented to person, place, and time.  Psychiatric:        Mood and Affect: Mood normal.   .. Results for orders placed or performed in visit on 12/09/23  POCT HgB A1C   Collection Time: 12/09/23  1:13  PM  Result Value Ref Range   Hemoglobin A1C 5.2 4.0 - 5.6 %   HbA1c POC (<> result, manual entry)     HbA1c, POC (prediabetic range)     HbA1c, POC (controlled diabetic range)          The 10-year ASCVD risk score (Arnett DK, et al., 2019) is: 2.1%    Assessment & Plan:  Marland KitchenMarland KitchenZitlaly "[name]" was seen today for medication refill.  Diagnoses and all orders for this visit:  Chronic pain syndrome -     ketorolac (TORADOL) injection 60 mg  Acute cystitis with hematuria  Chronic right-sided low back pain without sciatica -     ketorolac (TORADOL) injection 60 mg  Type 2 diabetes mellitus with other specified complication, without long-term current use of insulin (HCC) -     POCT HgB A1C   Pain  contract signed today UDS inadvertently not ordered, will get at next visit .Marland KitchenPDMP reviewed during this encounter. No concerns Refill sent yesterday Follow up in 3 months  Reviewed urine culture and omnicef should treat Pt continues to have some acute on chronic low back pain Toradol given IM today Finish omnicef and follow up if UTI symptoms not resolved  A1C looks great today Continue on same medication BP to goal On statin Eye and foot exam UTD Vaccines UTD Follow up in 3 months   Tandy Gaw, PA-C

## 2023-12-09 NOTE — Progress Notes (Unsigned)
Patient ID: Frances [name], female    DOB: Nov 01, 1976, 47 y.o.   MRN: [id number]  No chief complaint on file.     ICD-10-CM   1. Symptomatic mammary hypertrophy  N62        History of Present Illness: Frances [name] is a 47 y.o.  female  with a history of macromastia.  She presents for preoperative evaluation for upcoming procedure, Bilateral Breast Reduction with liposuction, scheduled for 01/07/2024 with Dr.  Ulice Bold  The patient {HAS HAS [id number]:[id number]} had problems with anesthesia. ***  Summary of Previous Visit: Patient was seen for initial consult by Dr. Ulice Bold on 10/09/2023.  At this visit, patient reported upper back and neck pain due to her enlarged breasts.  Her STN on the right was 32 cm and her STN on the left was 35 cm.  Her BMI was 28.3 kg/m.  Her preoperative bra size was likely a DDD cup.  Patient reported she has diabetes and her last hemoglobin A1c was 5.6.  Patient also reported she was on a weekly injection for her RA.  It was discussed with the patient that she would need to be off her RA injection for 1 month before surgery and 6 weeks after surgery.  Estimated excess breast tissue to be removed at time of surgery: 630 grams  Job: ***  PMH Significant for: ***   Past Medical History: Allergies: Allergies  Allergen Reactions   Metaxalone Hives    Current Medications:  Current Outpatient Medications:    albuterol (VENTOLIN HFA) 108 (90 Base) MCG/ACT inhaler, Take 2 puffs 15 minutes before exercise and as needed for shortness of breath., Disp: 6.7 g, Rfl: 1   ARIPiprazole (ABILIFY) 15 MG tablet, Take 1 tablet (15 mg total) by mouth daily., Disp: 30 tablet, Rfl: 3   atorvastatin (LIPITOR) 20 MG tablet, TAKE 1 TABLET BY MOUTH DAILY, Disp: 60 tablet, Rfl: 0   Azelastine HCl 137 MCG/SPRAY SOLN, USE 2 SPRAYS ALTERNATE NOSTRILS TWICE A DAY, Disp: 30 mL, Rfl: 2   baclofen (LIORESAL) 10 MG tablet, TAKE 1 TABLET BY MOUTH TWICE A DAY AS NEEDED FOR  MUSCLE SPASMS, Disp: 180 tablet, Rfl: 1   benztropine (COGENTIN) 0.5 MG tablet, Take 1 tablet (0.5 mg total) by mouth 2 (two) times daily., Disp: 60 tablet, Rfl: 3   blood glucose meter kit and supplies KIT, Dispense based on patient and insurance preference. Use up to four times daily as directed., Disp: 1 each, Rfl: 0   buPROPion (WELLBUTRIN XL) 300 MG 24 hr tablet, Take 1 tablet (300 mg total) by mouth daily., Disp: 30 tablet, Rfl: 3   cefdinir (OMNICEF) 300 MG capsule, Take 1 capsule (300 mg total) by mouth 2 (two) times daily for 7 days., Disp: 14 capsule, Rfl: 0   cholecalciferol (VITAMIN D3) 25 MCG (1000 UT) tablet, Take 1,000 Units by mouth daily., Disp: , Rfl:    diclofenac (VOLTAREN) 75 MG EC tablet, Take 1 tablet (75 mg total) by mouth 2 (two) times daily., Disp: 60 tablet, Rfl: 0   diclofenac Sodium (VOLTAREN) 1 % GEL, Apply 2 g topically 4 (four) times daily. To affected joint., Disp: 100 g, Rfl: 11   DULoxetine HCl 30 MG CSDR, Take 90 mg by mouth daily., Disp: , Rfl:    fluticasone (FLONASE) 50 MCG/ACT nasal spray, USE 2 SPRAYS IN EACH NOSRIL DAILY, Disp: 16 g, Rfl: 0   HYDROcodone-acetaminophen (NORCO/VICODIN) 5-325 MG tablet, Take 1 tablet by mouth 2 (two)  times daily as needed for moderate pain (pain score 4-6) (back pain)., Disp: 60 tablet, Rfl: 0   hydroxychloroquine (PLAQUENIL) 200 MG tablet, Take 200 mg by mouth 2 (two) times daily. 200mg  twice daily on weekdays and 200mg  once daily on weekends as of 12/03/22 review, Disp: , Rfl:    levocetirizine (XYZAL) 5 MG tablet, TAKE 1 TABLET BY MOUTH EVERY EVENING, Disp: 90 tablet, Rfl: 1   montelukast (SINGULAIR) 10 MG tablet, Take 1 tablet (10 mg total) by mouth at bedtime., Disp: 90 tablet, Rfl: 3   OZEMPIC, 2 MG/DOSE, 8 MG/3ML SOPN, INJECT 2 MG AS DIRECTED ONCE A WEEK., Disp: 9 mL, Rfl: 0   pregabalin (LYRICA) 200 MG capsule, TAKE 1 CAPSULE BY MOUTH 2-3 TIMES DAILY, Disp: 90 capsule, Rfl: 3   solifenacin (VESICARE) 10 MG tablet, Take 1  tablet (10 mg total) by mouth daily., Disp: 30 tablet, Rfl: 11   Tocilizumab (ACTEMRA) 162 MG/0.9ML SOSY, Inject 162 mg into the skin once a week., Disp: , Rfl:   Past Medical Problems: Past Medical History:  Diagnosis Date   Anxiety    Arthritis    Back pain, chronic    Bipolar 1 disorder (HCC)    Chronic fatigue syndrome    DDD (degenerative disc disease), lumbar    Depression    Diabetes mellitus without complication (HCC)    Fibromyalgia    IUD 2012   Mirena   MTHFR gene mutation     Past Surgical History: Past Surgical History:  Procedure Laterality Date   COLONOSCOPY WITH PROPOFOL N/A 02/25/2017   Procedure: COLONOSCOPY WITH PROPOFOL;  Surgeon: Willis Modena, MD;  Location: [facility];  Service: Endoscopy;  Laterality: N/A;   left knee lateral release  1993   scar tisue removal  03/2005   left ankle   TONSILLECTOMY  age 48's    Social History: Social History   Socioeconomic History   Marital status: Single    Spouse name: Not on file   Number of children: 0   Years of education: Not on file   Highest education level: Bachelor's degree (e.g., BA, AB, BS)  Occupational History   Occupation: Airline pilot at times  Tobacco Use   Smoking status: Never   Smokeless tobacco: Never  Vaping Use   Vaping status: Never Used  Substance and Sexual Activity   Alcohol use: No    Alcohol/week: 0.0 standard drinks of alcohol   Drug use: No   Sexual activity: Not on file  Other Topics Concern   Not on file  Social History Narrative   Originally from [place], outside of [place].    Moved here permanently 2012.   Intermittently worked on a farm.   Lives with many cats--3 plus fosters cats.    Single, lives alone in a one story home. Rarely drinks caffeine. 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   Social Drivers of Health   Financial Resource Strain: Medium Risk (06/12/2023)   Overall Financial Resource Strain (CARDIA)    Difficulty of  Paying Living Expenses: Somewhat hard  Food Insecurity: Food Insecurity Present (06/12/2023)   Hunger Vital Sign    Worried About Running Out of Food in the Last Year: Sometimes true    Ran Out of Food in the Last Year: Never true  Transportation Needs: No Transportation Needs (06/12/2023)   PRAPARE - Administrator, Civil Service (Medical): No    Lack of Transportation (Non-Medical): No  Physical Activity: Insufficiently  Active (07/27/2023)   Exercise Vital Sign    Days of Exercise per Week: 3 days    Minutes of Exercise per Session: 30 min  Stress: No Stress Concern Present (06/12/2023)   [employer] of Occupational Health - Occupational Stress Questionnaire    Feeling of Stress : Only a little  Social Connections: Socially Isolated (06/12/2023)   Social Connection and Isolation Panel [NHANES]    Frequency of Communication with Friends and Family: Never    Frequency of Social Gatherings with Friends and Family: Twice a week    Attends Religious Services: Never    Database administrator or Organizations: Yes    Attends Engineer, structural: More than 4 times per year    Marital Status: Never married  Intimate Partner Violence: Not At Risk (12/06/2020)   Humiliation, Afraid, Rape, and Kick questionnaire    Fear of Current or Ex-Partner: No    Emotionally Abused: No    Physically Abused: No    Sexually Abused: No    Family History: Family History  Problem Relation Age of Onset   Diabetes Mother    Stroke Mother    Lupus Mother    Hypertension Mother    Congestive Heart Failure Mother    Mental illness Brother        Not clear what his diagnosis is--may be related to previous drug use.   Cancer Maternal Grandmother        colon   Diabetes Maternal Grandfather    Depression Maternal Uncle    Alcohol abuse Maternal Uncle     Review of Systems: ROS  Physical Exam: Vital Signs There were no vitals taken for this visit.  Physical Exam  *** Constitutional:      General: Not in acute distress.    Appearance: Normal appearance. Not ill-appearing.  HENT:     Head: Normocephalic and atraumatic.  Eyes:     Pupils: Pupils are equal, round Neck:     Musculoskeletal: Normal range of motion.  Cardiovascular:     Rate and Rhythm: Normal rate    Pulses: Normal pulses.  Pulmonary:     Effort: Pulmonary effort is normal. No respiratory distress.  Musculoskeletal: Normal range of motion.  Skin:    General: Skin is warm and dry.     Findings: No erythema or rash.  Neurological:     General: No focal deficit present.     Mental Status: Alert and oriented to person, place, and time. Mental status is at baseline.     Motor: No weakness.  Psychiatric:        Mood and Affect: Mood normal.        Behavior: Behavior normal.    Assessment/Plan: The patient is scheduled for bilateral breast reduction with Dr. Ulice Bold.  Risks, benefits, and alternatives of procedure discussed, questions answered and consent obtained.    Smoking Status: ***; Counseling Given? *** Last Mammogram: 02/27/23; Results: BIRADS category 2 Benign  Caprini Score: ***; Risk Factors include: ***, BMI *** 25, and length of planned surgery. Recommendation for mechanical *** pharmacological prophylaxis. Encourage early ambulation.   Pictures obtained: @consult   Post-op Rx sent to pharmacy: {Blank:[id number]::"Oxycodone, Zofran, Keflex","Oxycodone, Zofran"}  Patient was provided with the breast reduction and General Surgical Risk consent document and Pain Medication Agreement prior to their appointment.  They had adequate time to read through the risk consent documents and Pain Medication Agreement. We also discussed them in person together during this preop appointment. All of  their questions were answered to their satisfaction.  Recommended calling if they have any further questions.  Risk consent form and Pain Medication Agreement to be scanned into patient's  chart.  The risk that can be encountered with breast reduction were discussed and include the following but not limited to these:  Breast asymmetry, fluid accumulation, firmness of the breast, inability to breast feed, loss of nipple or areola, skin loss, decrease or no nipple sensation, fat necrosis of the breast tissue, bleeding, infection, healing delay.  There are risks of anesthesia, changes to skin sensation and injury to nerves or blood vessels.  The muscle can be temporarily or permanently injured.  You may have an allergic reaction to tape, suture, glue, blood products which can result in skin discoloration, swelling, pain, skin lesions, poor healing.  Any of these can lead to the need for revisonal surgery or stage procedures.  A reduction has potential to interfere with diagnostic procedures.  Nipple or breast piercing can increase risks of infection.  This procedure is best done when the breast is fully developed.  Changes in the breast will continue to occur over time.  Pregnancy can alter the outcomes of previous breast reduction surgery, weight gain and weigh loss can also effect the long term appearance.     Electronically signed by: Laurena Spies, PA-C 12/09/2023 11:17 AM

## 2023-12-10 ENCOUNTER — Ambulatory Visit: Payer: MEDICAID | Admitting: Physician Assistant

## 2023-12-10 ENCOUNTER — Encounter: Payer: Self-pay | Admitting: Physician Assistant

## 2023-12-10 VITALS — BP 112/78 | HR 88

## 2023-12-10 DIAGNOSIS — N62 Hypertrophy of breast: Secondary | ICD-10-CM

## 2023-12-10 MED ORDER — ONDANSETRON 4 MG PO TBDP: 4.0000 mg | ORAL_TABLET | Freq: Three times a day (TID) | ORAL | 0 refills | Status: DC | PRN

## 2023-12-10 MED ORDER — CEPHALEXIN 500 MG PO CAPS: 500.0000 mg | ORAL_CAPSULE | Freq: Four times a day (QID) | ORAL | 0 refills | Status: AC

## 2023-12-10 NOTE — H&P (View-Only) (Signed)
 Patient ID: Frances [name], female    DOB: 03-09-1976, 47 y.o.   MRN: [id number]  Chief Complaint  Patient presents with   Pre-op Exam      ICD-10-CM   1. Symptomatic mammary hypertrophy  N62        History of Present Illness: Frances [name] is a 47 y.o.  female  with a history of macromastia.  She presents for preoperative evaluation for upcoming procedure, Bilateral Breast Reduction with liposuction, scheduled for 01/07/2024 with Dr.  Ulice Bold  The patient has not had problems with anesthesia.  Previous surgeries without complication.  She denies any significant cardiac or pulmonary history.  She denies any personal or family history of blood clots or clotting disorder.  She denies any personal history of cancer, varicosities, nicotine use, or requisite for blood thinner medication.  She is not on any birth control or hormone medication.  She takes baclofen, Norco, and Lyrica daily so we will send clearance to PCP regarding postoperative pain medication.  Will also send clearance to rheumatologist.  I spoke with Dr. Ulice Bold who would prefer that she discontinue the Plaquenil 3 weeks before and after surgery.  Patient states this is fine by her.  She can continue taking her diclofenac, but would prefer she hold it 3 days prior to surgery due to inhibition of platelet function.  Discussed postoperative restrictions and compression garments.  Summary of Previous Visit: Patient was seen for initial consult by Dr. Ulice Bold on 10/09/2023.  At this visit, patient reported upper back and neck pain due to her enlarged breasts.  Her STN on the right was 32 cm and her STN on the left was 35 cm.  Her BMI was 28.3 kg/m.  Her preoperative bra size was likely a DDD cup.  Patient reported she has diabetes and her last hemoglobin A1c was 5.6.  Patient also reported she was on a weekly injection for her RA.  It was discussed with the patient that she would need to be off her RA injection for 1  month before surgery and 6 weeks after surgery.  Estimated excess breast tissue to be removed at time of surgery: 630 grams.  Job: Not currently employed, no FMLA/STD required.  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  Reviewed most recent note from rheumatology who feels as though her joint effusion/inflammation is related to OA and for that reason Actemra was discontinued.  Recommended diclofenac as needed in addition to continued Plaquenil..   Past Medical History: Allergies: Allergies  Allergen Reactions   Metaxalone Hives    Current Medications:  Current Outpatient Medications:    albuterol (VENTOLIN HFA) 108 (90 Base) MCG/ACT inhaler, Take 2 puffs 15 minutes before exercise and as needed for shortness of breath., Disp: 6.7 g, Rfl: 1   ARIPiprazole (ABILIFY) 15 MG tablet, Take 1 tablet (15 mg total) by mouth daily., Disp: 30 tablet, Rfl: 3   atorvastatin (LIPITOR) 20 MG tablet, TAKE 1 TABLET BY MOUTH DAILY, Disp: 60 tablet, Rfl: 0   Azelastine HCl 137 MCG/SPRAY SOLN, USE 2 SPRAYS ALTERNATE NOSTRILS TWICE A DAY, Disp: 30 mL, Rfl: 2   baclofen (LIORESAL) 10 MG tablet, TAKE 1 TABLET BY MOUTH TWICE A DAY AS NEEDED FOR MUSCLE SPASMS, Disp: 180 tablet, Rfl: 1   benztropine (  COGENTIN) 0.5 MG tablet, Take 1 tablet (0.5 mg total) by mouth 2 (two) times daily., Disp: 60 tablet, Rfl: 3   blood glucose meter kit and supplies KIT, Dispense based on patient and insurance preference. Use up to four times daily as directed., Disp: 1 each, Rfl: 0   buPROPion (WELLBUTRIN XL) 300 MG 24 hr tablet, Take 1 tablet (300 mg total) by mouth daily., Disp: 30 tablet, Rfl: 3   cefdinir (OMNICEF) 300 MG capsule, Take 1 capsule (300 mg total) by mouth 2 (two) times daily for 7  days., Disp: 14 capsule, Rfl: 0   cephALEXin (KEFLEX) 500 MG capsule, Take 1 capsule (500 mg total) by mouth 4 (four) times daily for 3 days., Disp: 12 capsule, Rfl: 0   cholecalciferol (VITAMIN D3) 25 MCG (1000 UT) tablet, Take 1,000 Units by mouth daily., Disp: , Rfl:    diclofenac (VOLTAREN) 75 MG EC tablet, Take 1 tablet (75 mg total) by mouth 2 (two) times daily., Disp: 60 tablet, Rfl: 0   diclofenac Sodium (VOLTAREN) 1 % GEL, Apply 2 g topically 4 (four) times daily. To affected joint., Disp: 100 g, Rfl: 11   DULoxetine HCl 30 MG CSDR, Take 90 mg by mouth daily., Disp: , Rfl:    fluticasone (FLONASE) 50 MCG/ACT nasal spray, USE 2 SPRAYS IN EACH NOSRIL DAILY, Disp: 16 g, Rfl: 0   HYDROcodone-acetaminophen (NORCO/VICODIN) 5-325 MG tablet, Take 1 tablet by mouth 2 (two) times daily as needed for moderate pain (pain score 4-6) (back pain)., Disp: 60 tablet, Rfl: 0   hydroxychloroquine (PLAQUENIL) 200 MG tablet, Take 200 mg by mouth 2 (two) times daily. 200mg  twice daily on weekdays and 200mg  once daily on weekends as of 12/03/22 review, Disp: , Rfl:    levocetirizine (XYZAL) 5 MG tablet, TAKE 1 TABLET BY MOUTH EVERY EVENING, Disp: 90 tablet, Rfl: 1   montelukast (SINGULAIR) 10 MG tablet, Take 1 tablet (10 mg total) by mouth at bedtime., Disp: 90 tablet, Rfl: 3   ondansetron (ZOFRAN-ODT) 4 MG disintegrating tablet, Take 1 tablet (4 mg total) by mouth every 8 (eight) hours as needed for nausea or vomiting., Disp: 20 tablet, Rfl: 0   OZEMPIC, 2 MG/DOSE, 8 MG/3ML SOPN, INJECT 2 MG AS DIRECTED ONCE A WEEK., Disp: 9 mL, Rfl: 0   pregabalin (LYRICA) 200 MG capsule, TAKE 1 CAPSULE BY MOUTH 2-3 TIMES DAILY, Disp: 90 capsule, Rfl: 3   solifenacin (VESICARE) 10 MG tablet, Take 1 tablet (10 mg total) by mouth daily., Disp: 30 tablet, Rfl: 11  Past Medical Problems: Past Medical History:  Diagnosis Date   Anxiety    Arthritis    Back pain, chronic    Bipolar 1 disorder (HCC)    Chronic fatigue syndrome     DDD (degenerative disc disease), lumbar    Depression    Diabetes mellitus without complication (HCC)    Fibromyalgia    IUD 2012   Mirena   MTHFR gene mutation     Past Surgical History: Past Surgical History:  Procedure Laterality Date   COLONOSCOPY WITH PROPOFOL N/A 02/25/2017   Procedure: COLONOSCOPY WITH PROPOFOL;  Surgeon: Willis Modena, MD;  Location: [facility];  Service: Endoscopy;  Laterality: N/A;   left knee lateral release  1993   scar tisue removal  03/2005   left ankle   TONSILLECTOMY  age 72's    Social History: Social History   Socioeconomic History   Marital status: Single    Spouse name: Not on file  Number of children: 0   Years of education: Not on file   Highest education level: Bachelor's degree (e.g., BA, AB, BS)  Occupational History   Occupation: Airline pilot at times  Tobacco Use   Smoking status: Never   Smokeless tobacco: Never  Vaping Use   Vaping status: Never Used  Substance and Sexual Activity   Alcohol use: No    Alcohol/week: 0.0 standard drinks of alcohol   Drug use: No   Sexual activity: Not on file  Other Topics Concern   Not on file  Social History Narrative   Originally from [place], outside of [place].    Moved here permanently 2012.   Intermittently worked on a farm.   Lives with many cats--3 plus fosters cats.    Single, lives alone in a one story home. Rarely drinks caffeine. 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   Social Drivers of Health   Financial Resource Strain: Medium Risk (06/12/2023)   Overall Financial Resource Strain (CARDIA)    Difficulty of Paying Living Expenses: Somewhat hard  Food Insecurity: Food Insecurity Present (06/12/2023)   Hunger Vital Sign    Worried About Running Out of Food in the Last Year: Sometimes true    Ran Out of Food in the Last Year: Never true  Transportation Needs: No Transportation Needs (06/12/2023)   PRAPARE - Scientist, research (physical sciences) (Medical): No    Lack of Transportation (Non-Medical): No  Physical Activity: Insufficiently Active (07/27/2023)   Exercise Vital Sign    Days of Exercise per Week: 3 days    Minutes of Exercise per Session: 30 min  Stress: No Stress Concern Present (06/12/2023)   [employer] of Occupational Health - Occupational Stress Questionnaire    Feeling of Stress : Only a little  Social Connections: Socially Isolated (06/12/2023)   Social Connection and Isolation Panel [NHANES]    Frequency of Communication with Friends and Family: Never    Frequency of Social Gatherings with Friends and Family: Twice a week    Attends Religious Services: Never    Database administrator or Organizations: Yes    Attends Engineer, structural: More than 4 times per year    Marital Status: Never married  Intimate Partner Violence: Not At Risk (12/06/2020)   Humiliation, Afraid, Rape, and Kick questionnaire    Fear of Current or Ex-Partner: No    Emotionally Abused: No    Physically Abused: No    Sexually Abused: No    Family History: Family History  Problem Relation Age of Onset   Diabetes Mother    Stroke Mother    Lupus Mother    Hypertension Mother    Congestive Heart Failure Mother    Mental illness Brother        Not clear what his diagnosis is--may be related to previous drug use.   Cancer Maternal Grandmother        colon   Diabetes Maternal Grandfather    Depression Maternal Uncle    Alcohol abuse Maternal Uncle     Review of Systems: ROS Denies any recent chest pain, difficulty breathing, leg swelling, fevers  Physical Exam: Vital Signs BP 112/78 (BP Location: Right Arm, Patient Position: Sitting, Cuff Size: Normal)   Pulse 88   SpO2 98%   Physical Exam Constitutional:      General: Not in acute distress.    Appearance: Normal appearance. Not ill-appearing.  HENT:  Head: Normocephalic and atraumatic.  Eyes:     Pupils: Pupils are equal, round Neck:      Musculoskeletal: Normal range of motion.  Cardiovascular:     Rate and Rhythm: Normal rate    Pulses: Normal pulses.  Pulmonary:     Effort: Pulmonary effort is normal. No respiratory distress.  Musculoskeletal: Normal range of motion.  Skin:    General: Skin is warm and dry.     Findings: No erythema or rash.  Neurological:     General: No focal deficit present.     Mental Status: Alert and oriented to person, place, and time. Mental status is at baseline.     Motor: No weakness.  Psychiatric:        Mood and Affect: Mood normal.        Behavior: Behavior normal.    Assessment/Plan: The patient is scheduled for bilateral breast reduction with Dr. Ulice Bold.  Risks, benefits, and alternatives of procedure discussed, questions answered and consent obtained.    Smoking Status: Non-smoker. Last Mammogram: 02/27/23; Results: BIRADS category 2 Benign  Caprini Score: 4; Risk Factors include: Age, BMI greater than 25, and length of planned surgery. Recommendation for mechanical prophylaxis. Encourage early ambulation.   Pictures obtained: @consult   Post-op Rx sent to pharmacy: Keflex and Zofran. Will prescribe oxycodone upon receipt of clearance from PCP.   Patient was provided with the breast reduction and General Surgical Risk consent document and Pain Medication Agreement prior to their appointment.  They had adequate time to read through the risk consent documents and Pain Medication Agreement. We also discussed them in person together during this preop appointment. All of their questions were answered to their satisfaction.  Recommended calling if they have any further questions.  Risk consent form and Pain Medication Agreement to be scanned into patient's chart.  The risk that can be encountered with breast reduction were discussed and include the following but not limited to these:  Breast asymmetry, fluid accumulation, firmness of the breast, inability to breast feed, loss of  nipple or areola, skin loss, decrease or no nipple sensation, fat necrosis of the breast tissue, bleeding, infection, healing delay.  There are risks of anesthesia, changes to skin sensation and injury to nerves or blood vessels.  The muscle can be temporarily or permanently injured.  You may have an allergic reaction to tape, suture, glue, blood products which can result in skin discoloration, swelling, pain, skin lesions, poor healing.  Any of these can lead to the need for revisonal surgery or stage procedures.  A reduction has potential to interfere with diagnostic procedures.  Nipple or breast piercing can increase risks of infection.  This procedure is best done when the breast is fully developed.  Changes in the breast will continue to occur over time.  Pregnancy can alter the outcomes of previous breast reduction surgery, weight gain and weigh loss can also effect the long term appearance.     Electronically signed by: Evelena Leyden, PA-C 12/10/2023 2:37 PM

## 2023-12-10 NOTE — Progress Notes (Signed)
Patient ID: Frances [name], female    DOB: 03-09-1976, 47 y.o.   MRN: [id number]  Chief Complaint  Patient presents with   Pre-op Exam      ICD-10-CM   1. Symptomatic mammary hypertrophy  N62        History of Present Illness: Frances [name] is a 47 y.o.  female  with a history of macromastia.  She presents for preoperative evaluation for upcoming procedure, Bilateral Breast Reduction with liposuction, scheduled for 01/07/2024 with Dr.  Ulice Bold  The patient has not had problems with anesthesia.  Previous surgeries without complication.  She denies any significant cardiac or pulmonary history.  She denies any personal or family history of blood clots or clotting disorder.  She denies any personal history of cancer, varicosities, nicotine use, or requisite for blood thinner medication.  She is not on any birth control or hormone medication.  She takes baclofen, Norco, and Lyrica daily so we will send clearance to PCP regarding postoperative pain medication.  Will also send clearance to rheumatologist.  I spoke with Dr. Ulice Bold who would prefer that she discontinue the Plaquenil 3 weeks before and after surgery.  Patient states this is fine by her.  She can continue taking her diclofenac, but would prefer she hold it 3 days prior to surgery due to inhibition of platelet function.  Discussed postoperative restrictions and compression garments.  Summary of Previous Visit: Patient was seen for initial consult by Dr. Ulice Bold on 10/09/2023.  At this visit, patient reported upper back and neck pain due to her enlarged breasts.  Her STN on the right was 32 cm and her STN on the left was 35 cm.  Her BMI was 28.3 kg/m.  Her preoperative bra size was likely a DDD cup.  Patient reported she has diabetes and her last hemoglobin A1c was 5.6.  Patient also reported she was on a weekly injection for her RA.  It was discussed with the patient that she would need to be off her RA injection for 1  month before surgery and 6 weeks after surgery.  Estimated excess breast tissue to be removed at time of surgery: 630 grams.  Job: Not currently employed, no FMLA/STD required.  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  Reviewed most recent note from rheumatology who feels as though her joint effusion/inflammation is related to OA and for that reason Actemra was discontinued.  Recommended diclofenac as needed in addition to continued Plaquenil..   Past Medical History: Allergies: Allergies  Allergen Reactions   Metaxalone Hives    Current Medications:  Current Outpatient Medications:    albuterol (VENTOLIN HFA) 108 (90 Base) MCG/ACT inhaler, Take 2 puffs 15 minutes before exercise and as needed for shortness of breath., Disp: 6.7 g, Rfl: 1   ARIPiprazole (ABILIFY) 15 MG tablet, Take 1 tablet (15 mg total) by mouth daily., Disp: 30 tablet, Rfl: 3   atorvastatin (LIPITOR) 20 MG tablet, TAKE 1 TABLET BY MOUTH DAILY, Disp: 60 tablet, Rfl: 0   Azelastine HCl 137 MCG/SPRAY SOLN, USE 2 SPRAYS ALTERNATE NOSTRILS TWICE A DAY, Disp: 30 mL, Rfl: 2   baclofen (LIORESAL) 10 MG tablet, TAKE 1 TABLET BY MOUTH TWICE A DAY AS NEEDED FOR MUSCLE SPASMS, Disp: 180 tablet, Rfl: 1   benztropine (  COGENTIN) 0.5 MG tablet, Take 1 tablet (0.5 mg total) by mouth 2 (two) times daily., Disp: 60 tablet, Rfl: 3   blood glucose meter kit and supplies KIT, Dispense based on patient and insurance preference. Use up to four times daily as directed., Disp: 1 each, Rfl: 0   buPROPion (WELLBUTRIN XL) 300 MG 24 hr tablet, Take 1 tablet (300 mg total) by mouth daily., Disp: 30 tablet, Rfl: 3   cefdinir (OMNICEF) 300 MG capsule, Take 1 capsule (300 mg total) by mouth 2 (two) times daily for 7  days., Disp: 14 capsule, Rfl: 0   cephALEXin (KEFLEX) 500 MG capsule, Take 1 capsule (500 mg total) by mouth 4 (four) times daily for 3 days., Disp: 12 capsule, Rfl: 0   cholecalciferol (VITAMIN D3) 25 MCG (1000 UT) tablet, Take 1,000 Units by mouth daily., Disp: , Rfl:    diclofenac (VOLTAREN) 75 MG EC tablet, Take 1 tablet (75 mg total) by mouth 2 (two) times daily., Disp: 60 tablet, Rfl: 0   diclofenac Sodium (VOLTAREN) 1 % GEL, Apply 2 g topically 4 (four) times daily. To affected joint., Disp: 100 g, Rfl: 11   DULoxetine HCl 30 MG CSDR, Take 90 mg by mouth daily., Disp: , Rfl:    fluticasone (FLONASE) 50 MCG/ACT nasal spray, USE 2 SPRAYS IN EACH NOSRIL DAILY, Disp: 16 g, Rfl: 0   HYDROcodone-acetaminophen (NORCO/VICODIN) 5-325 MG tablet, Take 1 tablet by mouth 2 (two) times daily as needed for moderate pain (pain score 4-6) (back pain)., Disp: 60 tablet, Rfl: 0   hydroxychloroquine (PLAQUENIL) 200 MG tablet, Take 200 mg by mouth 2 (two) times daily. 200mg  twice daily on weekdays and 200mg  once daily on weekends as of 12/03/22 review, Disp: , Rfl:    levocetirizine (XYZAL) 5 MG tablet, TAKE 1 TABLET BY MOUTH EVERY EVENING, Disp: 90 tablet, Rfl: 1   montelukast (SINGULAIR) 10 MG tablet, Take 1 tablet (10 mg total) by mouth at bedtime., Disp: 90 tablet, Rfl: 3   ondansetron (ZOFRAN-ODT) 4 MG disintegrating tablet, Take 1 tablet (4 mg total) by mouth every 8 (eight) hours as needed for nausea or vomiting., Disp: 20 tablet, Rfl: 0   OZEMPIC, 2 MG/DOSE, 8 MG/3ML SOPN, INJECT 2 MG AS DIRECTED ONCE A WEEK., Disp: 9 mL, Rfl: 0   pregabalin (LYRICA) 200 MG capsule, TAKE 1 CAPSULE BY MOUTH 2-3 TIMES DAILY, Disp: 90 capsule, Rfl: 3   solifenacin (VESICARE) 10 MG tablet, Take 1 tablet (10 mg total) by mouth daily., Disp: 30 tablet, Rfl: 11  Past Medical Problems: Past Medical History:  Diagnosis Date   Anxiety    Arthritis    Back pain, chronic    Bipolar 1 disorder (HCC)    Chronic fatigue syndrome     DDD (degenerative disc disease), lumbar    Depression    Diabetes mellitus without complication (HCC)    Fibromyalgia    IUD 2012   Mirena   MTHFR gene mutation     Past Surgical History: Past Surgical History:  Procedure Laterality Date   COLONOSCOPY WITH PROPOFOL N/A 02/25/2017   Procedure: COLONOSCOPY WITH PROPOFOL;  Surgeon: Willis Modena, MD;  Location: [facility];  Service: Endoscopy;  Laterality: N/A;   left knee lateral release  1993   scar tisue removal  03/2005   left ankle   TONSILLECTOMY  age 72's    Social History: Social History   Socioeconomic History   Marital status: Single    Spouse name: Not on file  Number of children: 0   Years of education: Not on file   Highest education level: Bachelor's degree (e.g., BA, AB, BS)  Occupational History   Occupation: Airline pilot at times  Tobacco Use   Smoking status: Never   Smokeless tobacco: Never  Vaping Use   Vaping status: Never Used  Substance and Sexual Activity   Alcohol use: No    Alcohol/week: 0.0 standard drinks of alcohol   Drug use: No   Sexual activity: Not on file  Other Topics Concern   Not on file  Social History Narrative   Originally from [place], outside of [place].    Moved here permanently 2012.   Intermittently worked on a farm.   Lives with many cats--3 plus fosters cats.    Single, lives alone in a one story home. Rarely drinks caffeine. 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   Social Drivers of Health   Financial Resource Strain: Medium Risk (06/12/2023)   Overall Financial Resource Strain (CARDIA)    Difficulty of Paying Living Expenses: Somewhat hard  Food Insecurity: Food Insecurity Present (06/12/2023)   Hunger Vital Sign    Worried About Running Out of Food in the Last Year: Sometimes true    Ran Out of Food in the Last Year: Never true  Transportation Needs: No Transportation Needs (06/12/2023)   PRAPARE - Scientist, research (physical sciences) (Medical): No    Lack of Transportation (Non-Medical): No  Physical Activity: Insufficiently Active (07/27/2023)   Exercise Vital Sign    Days of Exercise per Week: 3 days    Minutes of Exercise per Session: 30 min  Stress: No Stress Concern Present (06/12/2023)   [employer] of Occupational Health - Occupational Stress Questionnaire    Feeling of Stress : Only a little  Social Connections: Socially Isolated (06/12/2023)   Social Connection and Isolation Panel [NHANES]    Frequency of Communication with Friends and Family: Never    Frequency of Social Gatherings with Friends and Family: Twice a week    Attends Religious Services: Never    Database administrator or Organizations: Yes    Attends Engineer, structural: More than 4 times per year    Marital Status: Never married  Intimate Partner Violence: Not At Risk (12/06/2020)   Humiliation, Afraid, Rape, and Kick questionnaire    Fear of Current or Ex-Partner: No    Emotionally Abused: No    Physically Abused: No    Sexually Abused: No    Family History: Family History  Problem Relation Age of Onset   Diabetes Mother    Stroke Mother    Lupus Mother    Hypertension Mother    Congestive Heart Failure Mother    Mental illness Brother        Not clear what his diagnosis is--may be related to previous drug use.   Cancer Maternal Grandmother        colon   Diabetes Maternal Grandfather    Depression Maternal Uncle    Alcohol abuse Maternal Uncle     Review of Systems: ROS Denies any recent chest pain, difficulty breathing, leg swelling, fevers  Physical Exam: Vital Signs BP 112/78 (BP Location: Right Arm, Patient Position: Sitting, Cuff Size: Normal)   Pulse 88   SpO2 98%   Physical Exam Constitutional:      General: Not in acute distress.    Appearance: Normal appearance. Not ill-appearing.  HENT:  Head: Normocephalic and atraumatic.  Eyes:     Pupils: Pupils are equal, round Neck:      Musculoskeletal: Normal range of motion.  Cardiovascular:     Rate and Rhythm: Normal rate    Pulses: Normal pulses.  Pulmonary:     Effort: Pulmonary effort is normal. No respiratory distress.  Musculoskeletal: Normal range of motion.  Skin:    General: Skin is warm and dry.     Findings: No erythema or rash.  Neurological:     General: No focal deficit present.     Mental Status: Alert and oriented to person, place, and time. Mental status is at baseline.     Motor: No weakness.  Psychiatric:        Mood and Affect: Mood normal.        Behavior: Behavior normal.    Assessment/Plan: The patient is scheduled for bilateral breast reduction with Dr. Ulice Bold.  Risks, benefits, and alternatives of procedure discussed, questions answered and consent obtained.    Smoking Status: Non-smoker. Last Mammogram: 02/27/23; Results: BIRADS category 2 Benign  Caprini Score: 4; Risk Factors include: Age, BMI greater than 25, and length of planned surgery. Recommendation for mechanical prophylaxis. Encourage early ambulation.   Pictures obtained: @consult   Post-op Rx sent to pharmacy: Keflex and Zofran. Will prescribe oxycodone upon receipt of clearance from PCP.   Patient was provided with the breast reduction and General Surgical Risk consent document and Pain Medication Agreement prior to their appointment.  They had adequate time to read through the risk consent documents and Pain Medication Agreement. We also discussed them in person together during this preop appointment. All of their questions were answered to their satisfaction.  Recommended calling if they have any further questions.  Risk consent form and Pain Medication Agreement to be scanned into patient's chart.  The risk that can be encountered with breast reduction were discussed and include the following but not limited to these:  Breast asymmetry, fluid accumulation, firmness of the breast, inability to breast feed, loss of  nipple or areola, skin loss, decrease or no nipple sensation, fat necrosis of the breast tissue, bleeding, infection, healing delay.  There are risks of anesthesia, changes to skin sensation and injury to nerves or blood vessels.  The muscle can be temporarily or permanently injured.  You may have an allergic reaction to tape, suture, glue, blood products which can result in skin discoloration, swelling, pain, skin lesions, poor healing.  Any of these can lead to the need for revisonal surgery or stage procedures.  A reduction has potential to interfere with diagnostic procedures.  Nipple or breast piercing can increase risks of infection.  This procedure is best done when the breast is fully developed.  Changes in the breast will continue to occur over time.  Pregnancy can alter the outcomes of previous breast reduction surgery, weight gain and weigh loss can also effect the long term appearance.     Electronically signed by: Evelena Leyden, PA-C 12/10/2023 2:37 PM

## 2023-12-11 ENCOUNTER — Ambulatory Visit: Payer: MEDICAID | Admitting: Physician Assistant

## 2023-12-11 NOTE — Unmapped (Signed)
[facility] has been contacted in regards to their refill of ACTEMRA ACTPEN 162 mg/0.9 mL Pnij (tocilizumab). At this time, they have declined refill due to  patient stated she is going off the medication for surgery and the provider is not going to put her back on the medication after she has surgery . Refill assessment call date has been updated per the patient's request.

## 2023-12-11 NOTE — Unmapped (Signed)
Specialty Medication(s): Actemra    Ms.Couey has been dis-enrolled from the [facility] specialty pharmacy services due to  per note on 11/27/23, patient to stop before surgery and not restart therapy .    Additional information provided to the patient: n/a    Julianne Rice, PharmD  [facility] Specialty Pharmacist

## 2023-12-17 ENCOUNTER — Ambulatory Visit (INDEPENDENT_AMBULATORY_CARE_PROVIDER_SITE_OTHER): Payer: MEDICAID | Admitting: Sports Medicine

## 2023-12-17 DIAGNOSIS — M47816 Spondylosis without myelopathy or radiculopathy, lumbar region: Secondary | ICD-10-CM

## 2023-12-17 MED ORDER — PREDNISONE 50 MG PO TABS: ORAL_TABLET | ORAL | 0 refills | Status: DC

## 2023-12-17 NOTE — Progress Notes (Signed)
    Procedures performed today:    None.  Independent interpretation of notes and tests performed by another provider:   None.  Brief History, Exam, Impression, and Recommendations:    Lumbar spondylosis This is a very pleasant 47 year old female, she has known multilevel lumbar stenosis moderate L3-L4, she has had multiple in the past including epidurals and facet injections, MBB/RFA. Ultimately Lyrica did provide some relief at 200 mg 2-3 times daily. As she is continuing to have symptomatology, this time left-sided axial back pain with radiation down the leg, anterolateral thigh but not past the knee we will proceed with a lumbar epidural. She does have imaging in the chart from 2022. We will also add formal physical therapy, 5 days of prednisone to hold her over in the meantime until she gets the shot. Return to see me in 6 weeks.    ____________________________________________ Ihor Austin. Benjamin Stain, M.D., ABFM., CAQSM., AME. Primary Care and Sports Medicine [facility]  Adjunct Professor of Family Medicine  [employer] [facility] [employer]  Restaurant manager, fast food

## 2023-12-17 NOTE — Assessment & Plan Note (Signed)
This is a very pleasant 47 year old female, she has known multilevel lumbar stenosis moderate L3-L4, she has had multiple in the past including epidurals and facet injections, MBB/RFA. Ultimately Lyrica did provide some relief at 200 mg 2-3 times daily. As she is continuing to have symptomatology, this time left-sided axial back pain with radiation down the leg, anterolateral thigh but not past the knee we will proceed with a lumbar epidural. She does have imaging in the chart from 2022. We will also add formal physical therapy, 5 days of prednisone to hold her over in the meantime until she gets the shot. Return to see me in 6 weeks.

## 2023-12-25 ENCOUNTER — Ambulatory Visit: Payer: MEDICAID | Admitting: Physical Therapy

## 2023-12-25 MED ORDER — OXYCODONE HCL 5 MG PO TABS: 5.0000 mg | ORAL_TABLET | Freq: Three times a day (TID) | ORAL | 0 refills | Status: AC | PRN

## 2023-12-28 ENCOUNTER — Ambulatory Visit (HOSPITAL_COMMUNITY): Payer: MEDICAID | Admitting: Mental Health

## 2023-12-28 DIAGNOSIS — F319 Bipolar disorder, unspecified: Secondary | ICD-10-CM | POA: Diagnosis not present

## 2023-12-28 DIAGNOSIS — F603 Borderline personality disorder: Secondary | ICD-10-CM

## 2023-12-28 NOTE — Progress Notes (Signed)
 THERAPIST PROGRESS NOTE Virtual Visit via Video Note  I connected with Frances [name] on 12/28/23 at  2:00 PM EST by a video enabled telemedicine application and verified that I am speaking with the correct person using two identifiers.  Location: Patient: address on file Provider: home office   I discussed the limitations of evaluation and management by telemedicine and the availability of in person appointments. The patient expressed understanding and agreed to proceed.  I discussed the assessment and treatment plan with the patient. The patient was provided an opportunity to ask questions and all were answered. The patient agreed with the plan and demonstrated an understanding of the instructions.   The patient was advised to call back or seek an in-person evaluation if the symptoms worsen or if the condition fails to improve as anticipated.  I provided 42 minutes of non-face-to-face time during this encounter.   Frances [name], [facility]   Session Time: 2:06 pm (42 minutes)  Participation Level: Active  Behavioral Response: CasualAlertAdequate  Type of Therapy: Individual Therapy  Treatment Goals addressed: STG: Frances [name] will increase control over thoughts AEB ability to engage in x 3 coping skills with ability to reframe thoughts as needed within the next 90 days.     ProgressTowards Goals: Progressing  Interventions: Supportive  Summary:  Frances [name] is a 48 y.o. female who presents with dx of Bipolar disorder, borderline personality disorder, GAD and ADHD. Presents for session alert and oriented; mood and affect euthymic; stable. Speech clear and coherent at normal rate and tone. Shares to be doing ok and reports some increase in anxiety with upcoming breat reduction surgery. \Notes feelings of excitement as well with hopes of reduction in pain in ability to complete every day things. Shares support with neighbors and friend who is moving in temporarily to  support with recovery. Inquires about employment services previously suggested by clinician and shares with therapist hx of working in the past and has not worked since 2009. 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 Notes for sleep to be good; appetite adequate. Shares for thoughts to be balanced and engaging in the community with plans to present to store after session. Notes to continue to foster cats and presents to adoption fairs every other week. Shares cats and engagement with horses to continue to be good coping skills for her and reframing thoughts as needed. Denies safety concerns. Sxs stable; progress with goals.   Suicidal/Homicidal: Nowithout intent/plan  Therapist Response: Therapist engaged Frances [name] in tele-therapy session. Completed check in and reviewed current level of functioning, sxs management and level of stressors. Provided supportive encouragement; validated feelings. Engaged Frances [name] in session exploring thoughts on upcoming surgery, explored hopes of increasing quality of life with decreased pain. Explored levels of depression and anxiety and ability to engage in coping skills and challenging maladaptive thinking patterns. Processed socialization and benefit to mental health and discouraged isolation. Explored hx of work and benefits of mental health and overall level functioning of ability to work. Encouraged ongoing connection to others. Reviewed session provided follow up.   Plan: Return again in  x 5 weeks.  Diagnosis: Bipolar I disorder (HCC)  Borderline personality disorder (HCC)  Collaboration of Care: Other None  Patient/Guardian was advised Release of Information must be obtained prior to any record release in order to collaborate their care with an outside provider. Patient/Guardian was advised if they have not already done so to contact the registration department  to sign all necessary forms in order for us  to release information  regarding their care.   Consent: Patient/Guardian gives verbal consent for treatment and assignment of benefits for services provided during this visit. Patient/Guardian expressed understanding and agreed to proceed.   Frances [name], [facility] 12/28/2023

## 2023-12-28 NOTE — Therapy (Signed)
 OUTPATIENT PHYSICAL THERAPY THORACOLUMBAR EVALUATION   Patient Name: Frances [name] MRN: [id number] DOB:1976-04-20, 48 y.o., female Today's Date: 12/29/2023  END OF SESSION:  PT End of Session - 12/29/23 1106     Visit Number 1    Number of Visits 12    Date for PT Re-Evaluation 02/23/24   due to patient having surgery next week   Authorization Type [employer]    PT Start Time 1105    PT Stop Time 1143    PT Time Calculation (min) 38 min    Activity Tolerance Patient tolerated treatment well    Behavior During Therapy WFL for tasks assessed/performed             Past Medical History:  Diagnosis Date   Anxiety    Arthritis    Back pain, chronic    Bipolar 1 disorder (HCC)    Chronic fatigue syndrome    DDD (degenerative disc disease), lumbar    Depression    Diabetes mellitus without complication (HCC)    Fibromyalgia    IUD 2012   Mirena   MTHFR gene mutation    Past Surgical History:  Procedure Laterality Date   COLONOSCOPY WITH PROPOFOL  N/A 02/25/2017   Procedure: COLONOSCOPY WITH PROPOFOL ;  Surgeon: Elsie Cree, MD;  Location: [facility];  Service: Endoscopy;  Laterality: N/A;   left knee lateral release  1993   scar tisue removal  03/2005   left ankle   TONSILLECTOMY  age 48's   Patient Active Problem List   Diagnosis Date Noted   Pain in left third proximal interphalangeal joint 10/16/2023   Personal history UTI 09/14/2023   Rheumatoid arthritis 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 (HCC) 09/14/2023   Urinary hesitancy 06/30/2023   Urinary frequency 06/16/2023   OAB (overactive bladder) 06/16/2023   Chronic pain syndrome 06/10/2023   Crush injury of right foot 05/25/2023   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   Microalbuminuria 09/09/2022   Night sweats 06/18/2022   Proteinuria 03/17/2022   Chronic right hip pain 01/06/2022   Acute pain of right shoulder 01/06/2022   ETD (Eustachian tube dysfunction), bilateral  08/23/2021   SOB (shortness of breath) on exertion 08/20/2021   Overweight (BMI 25.0-29.9) 06/18/2021   Attention deficit hyperactivity disorder (ADHD), 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   Hyperlipidemia LDL goal <70 03/18/2021   Diabetes mellitus (HCC) 03/13/2021   Seasonal allergies 12/25/2020   Bilateral leg edema 11/19/2020   Symptomatic mammary hypertrophy 09/04/2020   Recurrent major depressive disorder, in partial remission (HCC) 05/24/2020   Generalized anxiety disorder 05/24/2020   DDD (degenerative disc disease), cervical 03/13/2020   Ingrown right big toenail 03/13/2020   Labral tear of right hip joint, degenerative 01/11/2020   Grief reaction 11/28/2019   Elevated fasting glucose 04/08/2018   Borderline personality disorder (HCC) 01/22/2018   Seronegative rheumatoid arthritis (HCC) 12/09/2017   New daily persistent headache 09/26/2017   MTHFR gene mutation 09/20/2017   Bipolar I disorder (HCC) 09/16/2017   Chronic fatigue syndrome 09/16/2017   Back pain 10/13/2008   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   Lumbar spondylosis 02/10/2008   FIBROMYALGIA 02/10/2008   Other specified abnormal findings of blood chemistry 02/10/2008    PCP: Curtis Debby PARAS, MD   REFERRING PROVIDER: Curtis Debby PARAS,*   REFERRING DIAG: F52.183 (ICD-10-CM) - Lumbar spondylosis   Rationale for Evaluation and Treatment: Rehabilitation  THERAPY DIAG:  Radiculopathy, lumbar region  Cramp and spasm  Unsteadiness on feet  ONSET DATE: 2  weeks ago I think  SUBJECTIVE:                                                                                                                                                                                           SUBJECTIVE STATEMENT: It's been bad.  Usually on right side, but now it's on the left. It hurts to put my shoes on. Still feeling on right but not as bad. Getting injection 12/29/23. Patient reports a fall in the hay room  and fell backwards. She does not know if she tripped on something.  I lose my balance a lot especially with sitting to standing.   PERTINENT HISTORY:  L3-4 lumbar spondylosis, DM, anxiety/depression, BPD, RA  PAIN:  Are you having pain? Yes: NPRS scale: 4/10 up to 7/10 Pain location: Left low back around hip to ant thigh to knee Pain description: tingles, burns, numbness  on the left side and just to knee Aggravating factors: walking sitting in hard chairs Relieving factors: bending, lying down  PRECAUTIONS: None  RED FLAGS: None   WEIGHT BEARING RESTRICTIONS: No  FALLS:  Has patient fallen in last 6 months? Yes. Number of falls 1  LIVING ENVIRONMENT: Lives with: lives alone Lives in: House/apartment Stairs: Yes: External: 2 steps; none Has following equipment at home: None  OCCUPATION: unemployed  PLOF: Independent  PATIENT GOALS: reduce pain  NEXT MD VISIT: 01/28/24  OBJECTIVE:  Note: Objective measures were completed at Evaluation unless otherwise noted.  DIAGNOSTIC FINDINGS:  None recent  PATIENT SURVEYS:  Modified Oswestry 20 / 50 = 40.0 %   COGNITION: Overall cognitive status: Within functional limits for tasks assessed     SENSATION: WFL  MUSCLE LENGTH: WFL   POSTURE: rounded shoulders and forward head  PALPATION: L gluteals and lumbar  LUMBAR ROM: *pain  AROM eval  Flexion full  Extension 75% * left sided  Right lateral flexion Hand to mid thigh pain R  Left lateral flexion Hand to greater trochanter  Right rotation Full  Left rotation 75%; passive full   (Blank rows = not tested)  LOWER EXTREMITY ROM:   WFL  LOWER EXTREMITY MMT:  Grossly 5/5 except where noted  MMT Right eval Left eval  Hip flexion 5 3+* in back  Hip extension 5 4+  Hip abduction 5 4+  Hip adduction 5 4+  Hip internal rotation    Hip external rotation    Knee flexion 5 4+  Knee extension 5 5  Ankle dorsiflexion 5 5  Ankle plantarflexion    Ankle inversion     Ankle eversion     (Blank rows =  not tested)  LUMBAR SPECIAL TESTS:  Long sit test: Negative  FUNCTIONAL TESTS:  5 times sit to stand: 15.9 sec Dynamic Gait Index: TBD   TREATMENT DATE:                                                                                                                               12/29/23 See pt ed and HEP  If treatment provided at initial evaluation, no treatment charged due to lack of authorization.     PATIENT EDUCATION:  Education details: PT eval findings, anticipated POC, and initial HEP  Person educated: Patient Education method: Explanation, Demonstration, and Handouts Education comprehension: verbalized understanding and returned demonstration  HOME EXERCISE PROGRAM: Access Code: [email or web address] URL: [email or web address] Date: 12/29/2023 Prepared by: Mliss  Exercises - Single Leg Bridge  - 1 x daily - 3-4 x weekly - 1 sets - 10 reps - Supine March  - 1 x daily - 7 x weekly - 2 sets - 10 reps - Hooklying Sequential Leg March and Lower  - 1 x daily - 3-4 x weekly - 1 sets - 10 reps  ASSESSMENT:  CLINICAL IMPRESSION: Patient is a 48 y.o. female who was seen today for physical therapy evaluation and treatment for left lumbar radiculopathy secondary to lumbar spondylosis. She also reports balance problems and has had one fall. She has deficits in lumbar ROM and strength and has pain limiting sitting and walking. Plan to assess balance fully at next visit. No significant balance deficits were evident today during evaluation. She will benefit from skilled PT to address these deficits.  She is scheduled for a lumbar injection in 2 days and breast reduction surgery in a week, so will miss some time due to restrictions following this surgery.   OBJECTIVE IMPAIRMENTS: decreased activity tolerance, decreased ROM, decreased strength, increased muscle spasms, and pain.   ACTIVITY LIMITATIONS: lifting, sitting, transfers, and locomotion  level  PARTICIPATION LIMITATIONS: occupation  PERSONAL FACTORS: 1 comorbidity: upcoming surgery  are also affecting patient's functional outcome.   REHAB POTENTIAL: Good  CLINICAL DECISION MAKING: Stable/uncomplicated  EVALUATION COMPLEXITY: Low   GOALS: Goals reviewed with patient? Yes  SHORT TERM GOALS: Target date: 01/12/2024   Patient will be independent with initial HEP.  Baseline:  Goal status: INITIAL  2.  Patient will report centralization of radicular symptoms.  Baseline: down L leg to knee Goal status: INITIAL  LONG TERM GOALS: Target date: 02/23/2024   Patient will be independent with advanced/ongoing HEP to improve outcomes and carryover.  Baseline: initial HEP Goal status: INITIAL  2.  Patient will report 75% improvement in low back pain to improve QOL.  Baseline: 4-7/10 Goal status: INITIAL  3.  Patient will demonstrate functional pain free lumbar ROM to perform ADLs.   Baseline: see objective chart Goal status: INITIAL  4.  Patient will demonstrate improved strength to 5/5 to normalize ADLs. Baseline: see objective chart Goal status: INITIAL  5.  Patient will report 14 on modified oswestry to demonstrate improved functional ability.  Baseline: 20 / 50 = 40.0 % Goal status: INITIAL  6.  Patient will demonstrate at least 19/24 on DGI to improve gait stability and reduce risk for falls.  Baseline: TBD Goal status: INITIAL    PLAN:  PT FREQUENCY: 2x/week  PT DURATION: 8 weeks  PLANNED INTERVENTIONS: 97164- PT Re-evaluation, 97110-Therapeutic exercises, 97530- Therapeutic activity, [id number]- Neuromuscular re-education, 97535- Self Care, 02859- Manual therapy, [id number]- Gait training, [phone]- Aquatic Therapy, 97014- Electrical stimulation (unattended), [phone]- Traction (mechanical), Patient/Family education, Balance training, Taping, Dry Needling, Joint mobilization, Spinal mobilization, Cryotherapy, and Moist heat.  PLAN FOR NEXT SESSION: complete DGI and  go over breast reduction exercises, Review and progress HEP, lumbar ROM, flexion biased exercises, core strength, LE strengthening, gait, balance, manual/DN prn,    Mliss Cummins, PT  12/29/2023, 4:46 PM  For all possible CPT codes, reference the Planned Interventions line above.     Check all conditions that are expected to impact treatment: {Conditions expected to impact treatment:Musculoskeletal disorders and Complications related to surgery   If treatment provided at initial evaluation, no treatment charged due to lack of authorization.

## 2023-12-29 ENCOUNTER — Other Ambulatory Visit: Payer: Self-pay

## 2023-12-29 ENCOUNTER — Ambulatory Visit: Payer: MEDICAID | Attending: Sports Medicine | Admitting: Physical Therapy

## 2023-12-29 ENCOUNTER — Encounter: Payer: Self-pay | Admitting: Physical Therapy

## 2023-12-29 ENCOUNTER — Other Ambulatory Visit: Payer: Self-pay | Admitting: Sports Medicine

## 2023-12-29 DIAGNOSIS — M47816 Spondylosis without myelopathy or radiculopathy, lumbar region: Secondary | ICD-10-CM | POA: Insufficient documentation

## 2023-12-29 DIAGNOSIS — M5416 Radiculopathy, lumbar region: Secondary | ICD-10-CM | POA: Diagnosis not present

## 2023-12-29 DIAGNOSIS — R252 Cramp and spasm: Secondary | ICD-10-CM

## 2023-12-29 DIAGNOSIS — M6281 Muscle weakness (generalized): Secondary | ICD-10-CM | POA: Diagnosis present

## 2023-12-29 DIAGNOSIS — R2681 Unsteadiness on feet: Secondary | ICD-10-CM | POA: Diagnosis not present

## 2023-12-30 ENCOUNTER — Encounter (HOSPITAL_BASED_OUTPATIENT_CLINIC_OR_DEPARTMENT_OTHER): Payer: Self-pay | Admitting: Plastic Surgery

## 2023-12-30 ENCOUNTER — Other Ambulatory Visit: Payer: Self-pay

## 2023-12-30 NOTE — Progress Notes (Signed)
   12/30/23 1542  Pre-op Phone Call  Surgery Date Verified 01/07/24  Arrival Time Verified 0615  Surgery Location Verified [facility]  Is the patient taking a GLP-1 receptor agonist? Yes  Has the patient been informed on holding medication? Yes  Does the patient have diabetes? Type I  Does the patient use a Continuous Blood Glucose Monitor? No  Is the patient on an insulin pump? No  Has the diabetes coordinator been notified? No  Do you have a history of heart problems? No  Antiarrhythmic device type  (NA)  Does patient have other implanted devices? No  Patient Teaching Pre / Post Procedure  Patient educated about smoking cessation 24 hours prior to surgery. N/A Non-Smoker  Patient verbalizes understanding of bowel prep? N/A  Med Rec Completed Yes  Take the Following Meds the Morning of Surgery no meds AM of sx; hold NSAID/vit x5d; hold GLP x 7days  Recent  Lab Work, EKG, CXR? No  NPO (Including gum & candy) After midnight  Stop Solids, Milk, Candy, and Gum STARTING AT MIDNIGHT  Responsible adult to drive and be with you for 24 hours? Yes  Name & Phone Number for Ride/Caregiver montie (ride home); lauren (24 hr care)  No Jewelry, money, nail polish or make-up.  No lotions, powders, perfumes. No shaving  48 hrs. prior to surgery. Yes  Contacts, Dentures & Glasses Will Have to be Removed Before OR. Yes  Please bring your ID and Insurance Card the morning of your surgery. (Surgery Centers Only) Yes  Bring any papers or x-rays with you that your surgeon gave you. Yes  Instructed to contact the location of procedure/ provider if they or anyone in their household develops symptoms or tests positive for COVID-19, has close contact with someone who tests positive for COVID, or has known exposure to any contagious illness. Yes  Call this number the morning of surgery  with any problems that may cancel your surgery. [phone]  Covid-19 Assessment  Have you had a positive COVID-19 test within the  previous 90 days? No  COVID Testing Guidance Proceed with the additional questions.  Patient's surgery required a COVID-19 test (cardiothoracic, complex ENT, and bronchoscopies/ EBUS) No  Have you been unmasked and in close contact with anyone with COVID-19 or COVID-19 symptoms within the past 10 days? No  Do you or anyone in your household currently have any COVID-19 symptoms? No

## 2023-12-30 NOTE — Therapy (Signed)
 OUTPATIENT PHYSICAL THERAPY THORACOLUMBAR TREATMENT   Patient Name: Frances [name] MRN: [id number] DOB:[birth year], 48 y.o., female Today's Date: 12/31/2023  END OF SESSION:  PT End of Session - 12/31/23 1104     Visit Number 2    Number of Visits 12    Date for PT Re-Evaluation 02/23/24    Authorization Type [employer]    PT Start Time 1104    PT Stop Time 1146    PT Time Calculation (min) 42 min    Activity Tolerance Patient tolerated treatment well    Behavior During Therapy WFL for tasks assessed/performed              Past Medical History:  Diagnosis Date   Anxiety    Arthritis    Back pain, chronic    Bipolar 1 disorder (HCC)    Chronic fatigue syndrome    DDD (degenerative disc disease), lumbar    Depression    Diabetes mellitus without complication (HCC)    Fibromyalgia    IUD 2012   Mirena   MTHFR gene mutation    Rheumatoid arteritis (HCC)    Past Surgical History:  Procedure Laterality Date   COLONOSCOPY WITH PROPOFOL  N/A 02/25/2017   Procedure: COLONOSCOPY WITH PROPOFOL ;  Surgeon: Elsie Cree, MD;  Location: [facility];  Service: Endoscopy;  Laterality: N/A;   left knee lateral release  1993   scar tisue removal  03/2005   left ankle   TONSILLECTOMY  age 57's   Patient Active Problem List   Diagnosis Date Noted   Pain in left third proximal interphalangeal joint 10/16/2023   Personal history UTI 09/14/2023   Rheumatoid arthritis 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 (HCC) 09/14/2023   Urinary hesitancy 06/30/2023   Urinary frequency 06/16/2023   OAB (overactive bladder) 06/16/2023   Chronic pain syndrome 06/10/2023   Crush injury of right foot 05/25/2023   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   Microalbuminuria 09/09/2022   Night sweats 06/18/2022   Proteinuria 03/17/2022   Chronic right hip pain 01/06/2022   Acute pain of right shoulder 01/06/2022   ETD (Eustachian tube dysfunction), bilateral 08/23/2021   SOB  (shortness of breath) on exertion 08/20/2021   Overweight (BMI 25.0-29.9) 06/18/2021   Attention deficit hyperactivity disorder (ADHD), 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   Hyperlipidemia LDL goal <70 03/18/2021   Diabetes mellitus (HCC) 03/13/2021   Seasonal allergies 12/25/2020   Bilateral leg edema 11/19/2020   Symptomatic mammary hypertrophy 09/04/2020   Recurrent major depressive disorder, in partial remission (HCC) 05/24/2020   Generalized anxiety disorder 05/24/2020   DDD (degenerative disc disease), cervical 03/13/2020   Ingrown right big toenail 03/13/2020   Labral tear of right hip joint, degenerative 01/11/2020   Grief reaction 11/28/2019   Elevated fasting glucose 04/08/2018   Borderline personality disorder (HCC) 01/22/2018   Seronegative rheumatoid arthritis (HCC) 12/09/2017   New daily persistent headache 09/26/2017   MTHFR gene mutation 09/20/2017   Bipolar I disorder (HCC) 09/16/2017   Chronic fatigue syndrome 09/16/2017   Back pain 10/13/2008   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   Lumbar spondylosis 02/10/2008   FIBROMYALGIA 02/10/2008   Other specified abnormal findings of blood chemistry 02/10/2008    PCP: Curtis Debby PARAS, MD   REFERRING PROVIDER: Curtis Debby PARAS,*   REFERRING DIAG: F52.183 (ICD-10-CM) - Lumbar spondylosis   Rationale for Evaluation and Treatment: Rehabilitation  THERAPY DIAG:  Radiculopathy, lumbar region  Cramp and spasm  Unsteadiness on feet  Muscle weakness (generalized)  ONSET DATE: 2 weeks ago I think  SUBJECTIVE:                                                                                                                                                                                           SUBJECTIVE STATEMENT: Woke up in a lot of pain today.   PERTINENT HISTORY:  L3-4 lumbar spondylosis, DM, anxiety/depression, BPD, RA  PAIN:  Are you having pain? Yes: NPRS scale: 7/10 Pain location:  Left low back around hip to ant thigh to knee Pain description: tingles, burns, numbness  on the left side and just to knee Aggravating factors: walking sitting in hard chairs Relieving factors: bending, lying down  PRECAUTIONS: None  RED FLAGS: None   WEIGHT BEARING RESTRICTIONS: No  FALLS:  Has patient fallen in last 6 months? Yes. Number of falls 1  LIVING ENVIRONMENT: Lives with: lives alone Lives in: House/apartment Stairs: Yes: External: 2 steps; none Has following equipment at home: None  OCCUPATION: unemployed  PLOF: Independent  PATIENT GOALS: reduce pain  NEXT MD VISIT: 01/28/24  OBJECTIVE:  Note: Objective measures were completed at Evaluation unless otherwise noted.  DIAGNOSTIC FINDINGS:  None recent  PATIENT SURVEYS:  Modified Oswestry 20 / 50 = 40.0 %   COGNITION: Overall cognitive status: Within functional limits for tasks assessed     SENSATION: WFL  MUSCLE LENGTH: WFL   POSTURE: rounded shoulders and forward head  PALPATION: L gluteals and lumbar  LUMBAR ROM: *pain  AROM eval  Flexion full  Extension 75% * left sided  Right lateral flexion Hand to mid thigh pain R  Left lateral flexion Hand to greater trochanter  Right rotation Full  Left rotation 75%; passive full   (Blank rows = not tested)  LOWER EXTREMITY ROM:   WFL  LOWER EXTREMITY MMT:  Grossly 5/5 except where noted  MMT Right eval Left eval  Hip flexion 5 3+* in back  Hip extension 5 4+  Hip abduction 5 4+  Hip adduction 5 4+  Hip internal rotation    Hip external rotation    Knee flexion 5 4+  Knee extension 5 5  Ankle dorsiflexion 5 5  Ankle plantarflexion    Ankle inversion    Ankle eversion     (Blank rows = not tested)  LUMBAR SPECIAL TESTS:  Long sit test: Negative  FUNCTIONAL TESTS:  5 times sit to stand: 15.9 sec Dynamic Gait Index: 24/24  12/31/23 12/31/23 MCTSIB 120/120 BERG 55/56 SLS R 9 sec, L 30 sec   TREATMENT DATE:  12/31/23 DGI 24/24 MCTSIB 120/120 BERG 55/56 SLS R 9 sec, L 30 sec  Tandem and bwd tandem walking - no difficulty  Supine march x 10  Sequential march caused pain in post L low back today LTR x 10 B Bridge x 5 could not tolerate single leg bridge today  Manual: TPR to R QL with some deep STM in S/L  Issued breast reduction exercises and handouts - no charge due to no authorization  12/29/23 See pt ed and HEP  If treatment provided at initial evaluation, no treatment charged due to lack of authorization.     PATIENT EDUCATION:  Education details: PT eval findings, anticipated POC, and initial HEP  Person educated: Patient Education method: Explanation, Demonstration, and Handouts Education comprehension: verbalized understanding and returned demonstration  HOME EXERCISE PROGRAM: Access Code: [email or web address] URL: [email or web address] Date: 12/31/2023 Prepared by: Mliss  Exercises - Single Leg Bridge  - 1 x daily - 3-4 x weekly - 1 sets - 10 reps - Supine March  - 1 x daily - 7 x weekly - 2 sets - 10 reps - Hooklying Sequential Leg March and Lower  - 1 x daily - 3-4 x weekly - 1 sets - 10 reps - Supine Lower Trunk Rotation  - 2 x daily - 7 x weekly - 1 sets - 5 reps - 10 sec hold  Breast Reduction - post surgical exercises and h/o (see pt instructions)  Access Code: [email or web address] URL: [email or web address] Date: 12/31/2023 Prepared by: Mliss  Program Notes Here are the suggested stretches for after breast reduction surgery. Remember to avoid significant pain.   Exercises - Correct Seated Posture  - Seated Diaphragmatic Breathing  - 3 x daily - 7 x weekly - 10 reps - Shoulder External Rotation and Scapular Retraction  - 3 x daily - 7 x weekly - 5 reps - 3 seconds hold - Seated Chest Stretch with Hands Behind Head  - 3 x daily - 7 x weekly - 3 reps - 5  second hold - Standing Shoulder Flexion Wall Walk  - 3 x daily - 7 x weekly - 3 reps - 3 seconds hold - Standing Shoulder Abduction Finger Walk at Wall  - 3 x daily - 7 x weekly - 3 reps - 3 seconds hold  ASSESSMENT:  CLINICAL IMPRESSION:  [name] presents with increased LBP today. She was less tolerant to her HEP today, so we did what she was able. She did well on her balance testing (see objective) and is only limited with SLS on the R. Her injection has been rescheduled to 1/14. Patient educated on breast reduction exercises and information on what to expect post surgery.  She will benefit from ongoing core and LE strengthening.   Eval: Patient is a 48 y.o. female who was seen today for physical therapy evaluation and treatment for left lumbar radiculopathy secondary to lumbar spondylosis. She also reports balance problems and has had one fall. She has deficits in lumbar ROM and strength and has pain limiting sitting and walking. Plan to assess balance fully at next visit. No significant balance deficits were evident today during evaluation. She will benefit from skilled PT to address these deficits.  She is scheduled for a lumbar injection in 2 days and breast reduction surgery in a week, so will miss some time due to restrictions following this surgery.   OBJECTIVE IMPAIRMENTS: decreased activity tolerance, decreased ROM, decreased strength, increased muscle spasms, and pain.   ACTIVITY LIMITATIONS: lifting, sitting, transfers,  and locomotion level  PARTICIPATION LIMITATIONS: occupation  PERSONAL FACTORS: 1 comorbidity: upcoming surgery  are also affecting patient's functional outcome.   REHAB POTENTIAL: Good  CLINICAL DECISION MAKING: Stable/uncomplicated  EVALUATION COMPLEXITY: Low   GOALS: Goals reviewed with patient? Yes  SHORT TERM GOALS: Target date: 01/12/2024   Patient will be independent with initial HEP.  Baseline:  Goal status: INITIAL  2.  Patient will report  centralization of radicular symptoms.  Baseline: down L leg to knee Goal status: INITIAL  LONG TERM GOALS: Target date: 02/23/2024   Patient will be independent with advanced/ongoing HEP to improve outcomes and carryover.  Baseline: initial HEP Goal status: INITIAL  2.  Patient will report 75% improvement in low back pain to improve QOL.  Baseline: 4-7/10 Goal status: INITIAL  3.  Patient will demonstrate functional pain free lumbar ROM to perform ADLs.   Baseline: see objective chart Goal status: INITIAL  4.  Patient will demonstrate improved strength to 5/5 to normalize ADLs. Baseline: see objective chart Goal status: INITIAL  5.  Patient will report 14 on modified oswestry to demonstrate improved functional ability.  Baseline: 20 / 50 = 40.0 % Goal status: INITIAL  6.  Patient will demonstrate at least 19/24 on DGI to improve gait stability and reduce risk for falls.  Baseline: TBD Goal status: INITIAL    PLAN:  PT FREQUENCY: 2x/week  PT DURATION: 8 weeks  PLANNED INTERVENTIONS: 97164- PT Re-evaluation, 97110-Therapeutic exercises, 97530- Therapeutic activity, [id number]- Neuromuscular re-education, 97535- Self Care, 02859- Manual therapy, [id number]- Gait training, [phone]- Aquatic Therapy, 97014- Electrical stimulation (unattended), [phone]- Traction (mechanical), Patient/Family education, Balance training, Taping, Dry Needling, Joint mobilization, Spinal mobilization, Cryotherapy, and Moist heat.  PLAN FOR NEXT SESSION:work SLS, body mechanics, squats  Review and progress HEP, lumbar ROM, flexion biased exercises, core strength, LE strengthening, gait, balance, manual/DN prn,    Mliss Cummins, PT  12/31/2023, 1:15 PM  For all possible CPT codes, reference the Planned Interventions line above.     Check all conditions that are expected to impact treatment: {Conditions expected to impact treatment:Musculoskeletal disorders and Complications related to surgery   If treatment  provided at initial evaluation, no treatment charged due to lack of authorization.

## 2023-12-31 ENCOUNTER — Other Ambulatory Visit: Payer: MEDICAID

## 2023-12-31 ENCOUNTER — Ambulatory Visit: Payer: MEDICAID | Admitting: Physical Therapy

## 2023-12-31 ENCOUNTER — Encounter: Payer: Self-pay | Admitting: Physical Therapy

## 2023-12-31 ENCOUNTER — Encounter (HOSPITAL_BASED_OUTPATIENT_CLINIC_OR_DEPARTMENT_OTHER)
Admission: RE | Admit: 2023-12-31 | Discharge: 2023-12-31 | Disposition: A | Discharge status: Home / Self Care | Payer: MEDICAID | Source: Ambulatory Visit | Attending: Plastic Surgery | Admitting: Plastic Surgery

## 2023-12-31 DIAGNOSIS — Z01812 Encounter for preprocedural laboratory examination: Secondary | ICD-10-CM | POA: Insufficient documentation

## 2023-12-31 DIAGNOSIS — R252 Cramp and spasm: Secondary | ICD-10-CM

## 2023-12-31 DIAGNOSIS — M5416 Radiculopathy, lumbar region: Secondary | ICD-10-CM

## 2023-12-31 DIAGNOSIS — M47816 Spondylosis without myelopathy or radiculopathy, lumbar region: Secondary | ICD-10-CM | POA: Diagnosis not present

## 2023-12-31 DIAGNOSIS — M6281 Muscle weakness (generalized): Secondary | ICD-10-CM

## 2023-12-31 DIAGNOSIS — R2681 Unsteadiness on feet: Secondary | ICD-10-CM

## 2023-12-31 LAB — BASIC METABOLIC PANEL
Anion gap: 7 (ref 5–15)
BUN: 11 mg/dL (ref 6–20)
CO2: 26 mmol/L (ref 22–32)
Calcium: 9 mg/dL (ref 8.9–10.3)
Chloride: 106 mmol/L (ref 98–111)
Creatinine, Ser: 0.85 mg/dL (ref 0.44–1.00)
GFR, Estimated: >60 mL/min (ref >60)
Glucose, Bld: 76 mg/dL (ref 70–99)
Potassium: 4.2 mmol/L (ref 3.5–5.1)
Sodium: 139 mmol/L (ref 135–145)

## 2023-12-31 NOTE — Patient Instructions (Signed)
 Post op breast reduction surgery tips sheet   We recommend returning to a walking program on the day of surgery and light activity as tolerated to prevent blood clots but avoiding excessive sweating and moisture.    No heavy lifting greater than 20 pounds, no vigorous activity, and no submerging the incisions in water for 6 weeks.    You will likely have a binder placed around your trunk after surgery to provide compression. We also recommend getting a compression bra to wear after surgery once you are cleared by your surgeon to begin wearing it. [facility] has several options at [address], [place], [place] [address]. You need to call for an appointment at [phone]. Insurance may not cover that for breast reduction surgery. To buy one at a store, we recommend getting one that zips up the front so it is easier to get on and off.   Scar massage with coconut oil or a moisturizing lotion that you have already used, can begin at 2 weeks (your surgeon will decide) assuming the incision appears to be closed. This will help to get glue off. And then silicone scar sheets applied for a minimum of 18 hours per day or topical scar cream application 2x per day x 3 months.     You can wear normal bras after compression is discharged to help decrease the risk of aggravating your incisions. Avoiding underwire bras for at least 2 months is important.      Start the following exercises after your drain is removed but no pain is allowed. You should feel a good stretch, but no pain. Do these to your tolerance (see the sheet with pictures and instructions for each one):   -wall shoulder flexion    -wall shoulder abduction   -scap retractions   -seated chest stretch   -posture awareness   -diaphragmatic breathing      We can see you after surgery if you have trouble getting back to normal activity or have continued muscular pain. Give our office a call if you need to schedule at  [phone].

## 2024-01-04 ENCOUNTER — Ambulatory Visit: Payer: MEDICAID

## 2024-01-04 DIAGNOSIS — M47816 Spondylosis without myelopathy or radiculopathy, lumbar region: Secondary | ICD-10-CM | POA: Diagnosis not present

## 2024-01-04 DIAGNOSIS — R2681 Unsteadiness on feet: Secondary | ICD-10-CM

## 2024-01-04 DIAGNOSIS — M6281 Muscle weakness (generalized): Secondary | ICD-10-CM

## 2024-01-04 DIAGNOSIS — R252 Cramp and spasm: Secondary | ICD-10-CM

## 2024-01-04 DIAGNOSIS — M5416 Radiculopathy, lumbar region: Secondary | ICD-10-CM

## 2024-01-04 NOTE — Therapy (Signed)
 OUTPATIENT PHYSICAL THERAPY THORACOLUMBAR TREATMENT   Patient Name: Frances [name] MRN: [id number] DOB:15-Feb-1976, 48 y.o., female Today's Date: 01/04/2024  END OF SESSION:  PT End of Session - 01/04/24 1101     Visit Number 3    Number of Visits 12    Date for PT Re-Evaluation 02/23/24    Authorization Type [employer]    PT Start Time 1101    Activity Tolerance Patient tolerated treatment well    Behavior During Therapy WFL for tasks assessed/performed            Past Medical History:  Diagnosis Date   Anxiety    Arthritis    Back pain, chronic    Bipolar 1 disorder (HCC)    Chronic fatigue syndrome    DDD (degenerative disc disease), lumbar    Depression    Diabetes mellitus without complication (HCC)    Fibromyalgia    IUD 2012   Mirena   MTHFR gene mutation    Rheumatoid arteritis (HCC)    Past Surgical History:  Procedure Laterality Date   COLONOSCOPY WITH PROPOFOL  N/A 02/25/2017   Procedure: COLONOSCOPY WITH PROPOFOL ;  Surgeon: Elsie Cree, MD;  Location: [facility];  Service: Endoscopy;  Laterality: N/A;   left knee lateral release  1993   scar tisue removal  03/2005   left ankle   TONSILLECTOMY  age 35's   Patient Active Problem List   Diagnosis Date Noted   Pain in left third proximal interphalangeal joint 10/16/2023   Personal history UTI 09/14/2023   Rheumatoid arthritis 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 (HCC) 09/14/2023   Urinary hesitancy 06/30/2023   Urinary frequency 06/16/2023   OAB (overactive bladder) 06/16/2023   Chronic pain syndrome 06/10/2023   Crush injury of right foot 05/25/2023   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   Microalbuminuria 09/09/2022   Night sweats 06/18/2022   Proteinuria 03/17/2022   Chronic right hip pain 01/06/2022   Acute pain of right shoulder 01/06/2022   ETD (Eustachian tube dysfunction), bilateral 08/23/2021   SOB (shortness of breath) on exertion 08/20/2021   Overweight  (BMI 25.0-29.9) 06/18/2021   Attention deficit hyperactivity disorder (ADHD), 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   Hyperlipidemia LDL goal <70 03/18/2021   Diabetes mellitus (HCC) 03/13/2021   Seasonal allergies 12/25/2020   Bilateral leg edema 11/19/2020   Symptomatic mammary hypertrophy 09/04/2020   Recurrent major depressive disorder, in partial remission (HCC) 05/24/2020   Generalized anxiety disorder 05/24/2020   DDD (degenerative disc disease), cervical 03/13/2020   Ingrown right big toenail 03/13/2020   Labral tear of right hip joint, degenerative 01/11/2020   Grief reaction 11/28/2019   Elevated fasting glucose 04/08/2018   Borderline personality disorder (HCC) 01/22/2018   Seronegative rheumatoid arthritis (HCC) 12/09/2017   New daily persistent headache 09/26/2017   MTHFR gene mutation 09/20/2017   Bipolar I disorder (HCC) 09/16/2017   Chronic fatigue syndrome 09/16/2017   Back pain 10/13/2008   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   Lumbar spondylosis 02/10/2008   FIBROMYALGIA 02/10/2008   Other specified abnormal findings of blood chemistry 02/10/2008    PCP: Curtis Debby PARAS, MD   REFERRING PROVIDER: Curtis Debby PARAS,*   REFERRING DIAG: F52.183 (ICD-10-CM) - Lumbar spondylosis   Rationale for Evaluation and Treatment: Rehabilitation  THERAPY DIAG:  Radiculopathy, lumbar region  Cramp and spasm  Unsteadiness on feet  Muscle weakness (generalized)  ONSET DATE: 2 weeks ago I think  SUBJECTIVE:  SUBJECTIVE STATEMENT: Patient reports she is feeling very sore on L side of low back.   PERTINENT HISTORY:  L3-4 lumbar spondylosis, DM, anxiety/depression, BPD, RA  PAIN:  Are you having pain? Yes: NPRS scale: 7/10 Pain location: Left low back around hip to  ant thigh to knee Pain description: tingles, burns, numbness  on the left side and just to knee Aggravating factors: walking sitting in hard chairs Relieving factors: bending, lying down  PRECAUTIONS: None  RED FLAGS: None   WEIGHT BEARING RESTRICTIONS: No  FALLS:  Has patient fallen in last 6 months? Yes. Number of falls 1  LIVING ENVIRONMENT: Lives with: lives alone Lives in: House/apartment Stairs: Yes: External: 2 steps; none Has following equipment at home: None  OCCUPATION: unemployed  PLOF: Independent  PATIENT GOALS: reduce pain  NEXT MD VISIT: 01/28/24  OBJECTIVE:  Note: Objective measures were completed at Evaluation unless otherwise noted.  DIAGNOSTIC FINDINGS:  None recent  PATIENT SURVEYS:  Modified Oswestry 20 / 50 = 40.0 %   COGNITION: Overall cognitive status: Within functional limits for tasks assessed     SENSATION: WFL  MUSCLE LENGTH: WFL   POSTURE: rounded shoulders and forward head  PALPATION: L gluteals and lumbar  LUMBAR ROM: *pain  AROM eval  Flexion full  Extension 75% * left sided  Right lateral flexion Hand to mid thigh pain R  Left lateral flexion Hand to greater trochanter  Right rotation Full  Left rotation 75%; passive full   (Blank rows = not tested)  LOWER EXTREMITY ROM:   WFL  LOWER EXTREMITY MMT:  Grossly 5/5 except where noted  MMT Right eval Left eval  Hip flexion 5 3+* in back  Hip extension 5 4+  Hip abduction 5 4+  Hip adduction 5 4+  Hip internal rotation    Hip external rotation    Knee flexion 5 4+  Knee extension 5 5  Ankle dorsiflexion 5 5  Ankle plantarflexion    Ankle inversion    Ankle eversion     (Blank rows = not tested)  LUMBAR SPECIAL TESTS:  Long sit test: Negative  FUNCTIONAL TESTS:  5 times sit to stand: 15.9 sec Dynamic Gait Index: 24/24  12/31/23 12/31/23 MCTSIB 120/120 BERG 55/56 SLS R 9 sec, L 30 sec   [facility] Adult PT Treatment:                                             DATE: 01/04/2024 Therapeutic Exercise: Lateral cat in neutral spine --> hip bump L, looking to R LTR  L figure 4 LTR Hooklying marching Supine sciatic nerve glide 90/90 with feet on wall, big towel roll b/w knees: Adductor activation w/o glute gripping Bridges Diaphragmatic breathing  SKTC  Modified single leg stretch --> modified double leg stretch (pain on L LB) R S/L open books (hand behind head) S/L 90/90 --> illiacus pullbacks (feet on wall, yoga block b/w knees) Standing lateral pelvic glide to column Self-massage with 4 ball L Lumbar   TREATMENT DATE:  12/31/23 DGI 24/24 MCTSIB 120/120 BERG 55/56 SLS R 9 sec, L 30 sec  Tandem and bwd tandem walking - no difficulty  Supine march x 10  Sequential march caused pain in post L low back today LTR x 10 B Bridge x 5 could not tolerate single leg bridge today  Manual: TPR to R QL with some deep STM in S/L  Issued breast reduction exercises and handouts - no charge due to no authorization  12/29/23 See pt ed and HEP  If treatment provided at initial evaluation, no treatment charged due to lack of authorization.     PATIENT EDUCATION:  Education details: PT eval findings, anticipated POC, and initial HEP  Person educated: Patient Education method: Explanation, Demonstration, and Handouts Education comprehension: verbalized understanding and returned demonstration  HOME EXERCISE PROGRAM: Access Code: [email or web address] URL: [email or web address] Date: 12/31/2023 Prepared by: Mliss  Exercises - Single Leg Bridge  - 1 x daily - 3-4 x weekly - 1 sets - 10 reps - Supine March  - 1 x daily - 7 x weekly - 2 sets - 10 reps - Hooklying Sequential Leg March and Lower  - 1 x daily - 3-4 x weekly - 1 sets - 10 reps - Supine Lower Trunk Rotation  - 2 x daily - 7 x weekly - 1 sets - 5 reps - 10 sec  hold  Breast Reduction - post surgical exercises and h/o (see pt instructions)  Access Code: [email or web address] URL: [email or web address] Date: 12/31/2023 Prepared by: Mliss  Program Notes Here are the suggested stretches for after breast reduction surgery. Remember to avoid significant pain.   Exercises - Correct Seated Posture  - Seated Diaphragmatic Breathing  - 3 x daily - 7 x weekly - 10 reps - Shoulder External Rotation and Scapular Retraction  - 3 x daily - 7 x weekly - 5 reps - 3 seconds hold - Seated Chest Stretch with Hands Behind Head  - 3 x daily - 7 x weekly - 3 reps - 5 second hold - Standing Shoulder Flexion Wall Walk  - 3 x daily - 7 x weekly - 3 reps - 3 seconds hold - Standing Shoulder Abduction Finger Walk at Wall  - 3 x daily - 7 x weekly - 3 reps - 3 seconds hold  ASSESSMENT:  CLINICAL IMPRESSION:  Increased L lumbar pain during supine core strengthening exercises; cueing LE mechanics helped, however patient exhibits weak core strength and stability. Proximal hamstring activation added with diaphragmatic breathing with focus on decreasing gripping in glutes and maintaining neutral pelvic alignment.    Eval: Patient is a 48 y.o. female who was seen today for physical therapy evaluation and treatment for left lumbar radiculopathy secondary to lumbar spondylosis. She also reports balance problems and has had one fall. She has deficits in lumbar ROM and strength and has pain limiting sitting and walking. Plan to assess balance fully at next visit. No significant balance deficits were evident today during evaluation. She will benefit from skilled PT to address these deficits.  She is scheduled for a lumbar injection in 2 days and breast reduction surgery in a week, so will miss some time due to restrictions following this surgery.   OBJECTIVE IMPAIRMENTS: decreased activity tolerance, decreased ROM, decreased strength, increased muscle spasms, and pain.   ACTIVITY  LIMITATIONS: lifting, sitting, transfers, and locomotion level  PARTICIPATION LIMITATIONS: occupation  PERSONAL FACTORS: 1 comorbidity: upcoming surgery  are also affecting patient's functional outcome.   REHAB POTENTIAL: Good  CLINICAL  DECISION MAKING: Stable/uncomplicated  EVALUATION COMPLEXITY: Low   GOALS: Goals reviewed with patient? Yes  SHORT TERM GOALS: Target date: 01/12/2024   Patient will be independent with initial HEP.  Baseline:  Goal status: INITIAL  2.  Patient will report centralization of radicular symptoms.  Baseline: down L leg to knee Goal status: INITIAL  LONG TERM GOALS: Target date: 02/23/2024   Patient will be independent with advanced/ongoing HEP to improve outcomes and carryover.  Baseline: initial HEP Goal status: INITIAL  2.  Patient will report 75% improvement in low back pain to improve QOL.  Baseline: 4-7/10 Goal status: INITIAL  3.  Patient will demonstrate functional pain free lumbar ROM to perform ADLs.   Baseline: see objective chart Goal status: INITIAL  4.  Patient will demonstrate improved strength to 5/5 to normalize ADLs. Baseline: see objective chart Goal status: INITIAL  5.  Patient will report 14 on modified oswestry to demonstrate improved functional ability.  Baseline: 20 / 50 = 40.0 % Goal status: INITIAL  6.  Patient will demonstrate at least 19/24 on DGI to improve gait stability and reduce risk for falls.  Baseline: TBD Goal status: INITIAL    PLAN:  PT FREQUENCY: 2x/week  PT DURATION: 8 weeks  PLANNED INTERVENTIONS: [id number]- PT Re-evaluation, 97110-Therapeutic exercises, 97530- Therapeutic activity, [id number]- Neuromuscular re-education, 97535- Self Care, [id number]- Manual therapy, [id number]- Gait training, [phone]- Aquatic Therapy, 97014- Electrical stimulation (unattended), [phone]- Traction (mechanical), Patient/Family education, Balance training, Taping, Dry Needling, Joint mobilization, Spinal mobilization, Cryotherapy,  and Moist heat.  PLAN FOR NEXT SESSION: work SLS, body mechanics, squats, lumbar ROM, flexion biased exercises, core strength, LE strengthening, gait, balance, manual/DN prn   Lamarr Price, PTA 01/04/2024, 11:02 AM

## 2024-01-05 ENCOUNTER — Other Ambulatory Visit: Payer: MEDICAID

## 2024-01-06 ENCOUNTER — Ambulatory Visit: Payer: MEDICAID

## 2024-01-06 DIAGNOSIS — M6281 Muscle weakness (generalized): Secondary | ICD-10-CM

## 2024-01-06 DIAGNOSIS — R2681 Unsteadiness on feet: Secondary | ICD-10-CM

## 2024-01-06 DIAGNOSIS — M47816 Spondylosis without myelopathy or radiculopathy, lumbar region: Secondary | ICD-10-CM | POA: Diagnosis not present

## 2024-01-06 DIAGNOSIS — M5416 Radiculopathy, lumbar region: Secondary | ICD-10-CM

## 2024-01-06 DIAGNOSIS — R252 Cramp and spasm: Secondary | ICD-10-CM

## 2024-01-06 NOTE — Therapy (Signed)
 OUTPATIENT PHYSICAL THERAPY THORACOLUMBAR TREATMENT   Patient Name: Frances [name] MRN: [id number] DOB:Mar 03, 1976, 48 y.o., female Today's Date: 01/06/2024  END OF SESSION:  PT End of Session - 01/06/24 1110     Visit Number 4    Number of Visits 12    Date for PT Re-Evaluation 02/23/24    Authorization Type [employer]    PT Start Time 1110   pt arrived late   PT Stop Time 1148    PT Time Calculation (min) 38 min    Activity Tolerance Patient tolerated treatment well    Behavior During Therapy WFL for tasks assessed/performed            Past Medical History:  Diagnosis Date   Anxiety    Arthritis    Back pain, chronic    Bipolar 1 disorder (HCC)    Chronic fatigue syndrome    DDD (degenerative disc disease), lumbar    Depression    Diabetes mellitus without complication (HCC)    Fibromyalgia    IUD 2012   Mirena   MTHFR gene mutation    Rheumatoid arteritis (HCC)    Past Surgical History:  Procedure Laterality Date   COLONOSCOPY WITH PROPOFOL  N/A 02/25/2017   Procedure: COLONOSCOPY WITH PROPOFOL ;  Surgeon: Evangeline Hilts, MD;  Location: [facility];  Service: Endoscopy;  Laterality: N/A;   left knee lateral release  1993   scar tisue removal  03/2005   left ankle   TONSILLECTOMY  age 37's   Patient Active Problem List   Diagnosis Date Noted   Pain in left third proximal interphalangeal joint 10/16/2023   Personal history UTI 09/14/2023   Rheumatoid arthritis 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 (HCC) 09/14/2023   Urinary hesitancy 06/30/2023   Urinary frequency 06/16/2023   OAB (overactive bladder) 06/16/2023   Chronic pain syndrome 06/10/2023   Crush injury of right foot 05/25/2023   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   Microalbuminuria 09/09/2022   Night sweats 06/18/2022   Proteinuria 03/17/2022   Chronic right hip pain 01/06/2022   Acute pain of right shoulder 01/06/2022   ETD (Eustachian tube dysfunction), bilateral  08/23/2021   SOB (shortness of breath) on exertion 08/20/2021   Overweight (BMI 25.0-29.9) 06/18/2021   Attention deficit hyperactivity disorder (ADHD), 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   Hyperlipidemia LDL goal <70 03/18/2021   Diabetes mellitus (HCC) 03/13/2021   Seasonal allergies 12/25/2020   Bilateral leg edema 11/19/2020   Symptomatic mammary hypertrophy 09/04/2020   Recurrent major depressive disorder, in partial remission (HCC) 05/24/2020   Generalized anxiety disorder 05/24/2020   DDD (degenerative disc disease), cervical 03/13/2020   Ingrown right big toenail 03/13/2020   Labral tear of right hip joint, degenerative 01/11/2020   Grief reaction 11/28/2019   Elevated fasting glucose 04/08/2018   Borderline personality disorder (HCC) 01/22/2018   Seronegative rheumatoid arthritis (HCC) 12/09/2017   New daily persistent headache 09/26/2017   MTHFR gene mutation 09/20/2017   Bipolar I disorder (HCC) 09/16/2017   Chronic fatigue syndrome 09/16/2017   Back pain 10/13/2008   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   Lumbar spondylosis 02/10/2008   FIBROMYALGIA 02/10/2008   Other specified abnormal findings of blood chemistry 02/10/2008    PCP: Gean Keels, MD   REFERRING PROVIDER: Gean Keels,*   REFERRING DIAG: W09.811 (ICD-10-CM) - Lumbar spondylosis   Rationale for Evaluation and Treatment: Rehabilitation  THERAPY DIAG:  Radiculopathy, lumbar region  Cramp and spasm  Unsteadiness on feet  Muscle weakness (  generalized)  ONSET DATE: 2 weeks ago I think  SUBJECTIVE:                                                                                                                                                                                           SUBJECTIVE STATEMENT: Patient reports pain is worse on R lumbar side today; states 2/10 pain on Lumbar and 7/10 pain on R lumbar.  PERTINENT HISTORY:  L3-4 lumbar spondylosis, DM,  anxiety/depression, BPD, RA  PAIN:  Are you having pain? Yes: NPRS scale: 7/10 Pain location: Left low back around hip to ant thigh to knee Pain description: tingles, burns, numbness  on the left side and just to knee Aggravating factors: walking sitting in hard chairs Relieving factors: bending, lying down  PRECAUTIONS: None  RED FLAGS: None   WEIGHT BEARING RESTRICTIONS: No  FALLS:  Has patient fallen in last 6 months? Yes. Number of falls 1  LIVING ENVIRONMENT: Lives with: lives alone Lives in: House/apartment Stairs: Yes: External: 2 steps; none Has following equipment at home: None  OCCUPATION: unemployed  PLOF: Independent  PATIENT GOALS: reduce pain  NEXT MD VISIT: 01/28/24  OBJECTIVE:  Note: Objective measures were completed at Evaluation unless otherwise noted.  DIAGNOSTIC FINDINGS:  None recent  PATIENT SURVEYS:  Modified Oswestry 20 / 50 = 40.0 %   COGNITION: Overall cognitive status: Within functional limits for tasks assessed     SENSATION: WFL  MUSCLE LENGTH: WFL   POSTURE: rounded shoulders and forward head  PALPATION: L gluteals and lumbar  LUMBAR ROM: *pain  AROM eval  Flexion full  Extension 75% * left sided  Right lateral flexion Hand to mid thigh pain R  Left lateral flexion Hand to greater trochanter  Right rotation Full  Left rotation 75%; passive full   (Blank rows = not tested)  LOWER EXTREMITY ROM:   WFL  LOWER EXTREMITY MMT:  Grossly 5/5 except where noted  MMT Right eval Left eval  Hip flexion 5 3+* in back  Hip extension 5 4+  Hip abduction 5 4+  Hip adduction 5 4+  Hip internal rotation    Hip external rotation    Knee flexion 5 4+  Knee extension 5 5  Ankle dorsiflexion 5 5  Ankle plantarflexion    Ankle inversion    Ankle eversion     (Blank rows = not tested)  LUMBAR SPECIAL TESTS:  Long sit test: Negative  FUNCTIONAL TESTS:  5 times sit to stand: 15.9 sec Dynamic Gait Index: 24/24   12/31/23 12/31/23 MCTSIB 120/120 BERG 55/56 SLS R 9 sec, L 30 sec  [facility] Adult PT Treatment:                                           DATE: 01/06/2024 Therapeutic Exercise: Quadruped rock back to heels (lumbar flexion) + static cups on bilateral lumbar paraspinals/upper glutes Seated green PB roll out front x10 LTR  Bridges + yoga block b/w knees Side Lying: Open books --> hand behind head Straight leg hip abd Hip ext in abd (small ROM) Manual Therapy: Cupping treatment:  Dynamic/gliding lumbar paraspinals Static - lumbar paraspinals & upper glutes Shearing technique with lumbar flexion   [facility] Adult PT Treatment:                                            DATE: 01/04/2024 Therapeutic Exercise: Lateral cat in neutral spine --> hip bump L, looking to R LTR  L figure 4 LTR Hooklying marching Supine sciatic nerve glide 90/90 with feet on wall, big towel roll b/w knees: Adductor activation w/o glute gripping Bridges Diaphragmatic breathing  SKTC  Modified single leg stretch --> modified double leg stretch (pain on L LB) R S/L open books (hand behind head) S/L 90/90 --> illiacus pullbacks (feet on wall, yoga block b/w knees) Standing lateral pelvic glide to column Self-massage with 4" ball L Lumbar   TREATMENT DATE:                                                                                                                               12/31/23 DGI 24/24 MCTSIB 120/120 BERG 55/56 SLS R 9 sec, L 30 sec  Tandem and bwd tandem walking - no difficulty  Supine march x 10  Sequential march caused pain in post L low back today LTR x 10 B Bridge x 5 could not tolerate single leg bridge today  Manual: TPR to R QL with some deep STM in S/L  Issued breast reduction exercises and handouts - no charge due to no authorization  12/29/23 See pt ed and HEP  If treatment provided at initial evaluation, no treatment charged due to lack of authorization.     PATIENT EDUCATION:   Education details: Diaphragmatic breathing (HEP) Person educated: Patient Education method: Explanation, Demonstration, and Handouts Education comprehension: verbalized understanding and returned demonstration  HOME EXERCISE PROGRAM: Access Code: [email or web address] URL: [email or web address] Date: 01/06/2024 Prepared by: Sims Duck  Exercises - Single Leg Bridge  - 1 x daily - 3-4 x weekly - 1 sets - 10 reps - Supine March  - 1 x daily - 7 x weekly - 2 sets - 10 reps - Hooklying Sequential Leg March and Lower  - 1 x daily - 3-4 x weekly - 1 sets - 10 reps - Supine Lower Trunk  Rotation  - 2 x daily - 7 x weekly - 1 sets - 5 reps - 10 sec hold - Diaphragmatic Breathing at 90/90 Supported  - 1 x daily - 7 x weekly - 3 sets - 10 reps  Breast Reduction - post surgical exercises and h/o (see pt instructions)  Access Code: [email or web address] URL: [email or web address] Date: 12/31/2023 Prepared by: Concha Deed  Program Notes Here are the suggested stretches for after breast reduction surgery. Remember to avoid significant pain.   Exercises - Correct Seated Posture  - Seated Diaphragmatic Breathing  - 3 x daily - 7 x weekly - 10 reps - Shoulder External Rotation and Scapular Retraction  - 3 x daily - 7 x weekly - 5 reps - 3 seconds hold - Seated Chest Stretch with Hands Behind Head  - 3 x daily - 7 x weekly - 3 reps - 5 second hold - Standing Shoulder Flexion Wall Walk  - 3 x daily - 7 x weekly - 3 reps - 3 seconds hold - Standing Shoulder Abduction Finger Walk at Wall  - 3 x daily - 7 x weekly - 3 reps - 3 seconds hold  ASSESSMENT:  CLINICAL IMPRESSION:  Cupping treatment incorporating a range of technique incorporated in session today to decrease thoracolumbar myofascial tightness and tension. Initial pain on R side thoracolumbar during side lying trunk rotation, however cueing smaller range and hand placement behind head for support significantly decreased symptoms. Patient  reported decreased pain on R side of lumbar and minimal change in L side pain.  Eval: Patient is a 48 y.o. female who was seen today for physical therapy evaluation and treatment for left lumbar radiculopathy secondary to lumbar spondylosis. She also reports balance problems and has had one fall. She has deficits in lumbar ROM and strength and has pain limiting sitting and walking. Plan to assess balance fully at next visit. No significant balance deficits were evident today during evaluation. She will benefit from skilled PT to address these deficits.  She is scheduled for a lumbar injection in 2 days and breast reduction surgery in a week, so will miss some time due to restrictions following this surgery.   OBJECTIVE IMPAIRMENTS: decreased activity tolerance, decreased ROM, decreased strength, increased muscle spasms, and pain.   ACTIVITY LIMITATIONS: lifting, sitting, transfers, and locomotion level  PARTICIPATION LIMITATIONS: occupation  PERSONAL FACTORS: 1 comorbidity: upcoming surgery  are also affecting patient's functional outcome.   REHAB POTENTIAL: Good  CLINICAL DECISION MAKING: Stable/uncomplicated  EVALUATION COMPLEXITY: Low   GOALS: Goals reviewed with patient? Yes  SHORT TERM GOALS: Target date: 01/12/2024   Patient will be independent with initial HEP.  Baseline:  Goal status: INITIAL  2.  Patient will report centralization of radicular symptoms.  Baseline: no change Goal status: IN PROGRESS  LONG TERM GOALS: Target date: 02/23/2024   Patient will be independent with advanced/ongoing HEP to improve outcomes and carryover.  Baseline: initial HEP Goal status: INITIAL  2.  Patient will report 75% improvement in low back pain to improve QOL.  Baseline: 4-7/10 Goal status: INITIAL  3.  Patient will demonstrate functional pain free lumbar ROM to perform ADLs.   Baseline: see objective chart Goal status: INITIAL  4.  Patient will demonstrate improved strength to  5/5 to normalize ADLs. Baseline: see objective chart Goal status: INITIAL  5.  Patient will report 14 on modified oswestry to demonstrate improved functional ability.  Baseline: 20 / 50 = 40.0 % Goal status: INITIAL  6.  Patient will demonstrate at least 19/24 on DGI to improve gait stability and reduce risk for falls.  Baseline: TBD Goal status: INITIAL    PLAN:  PT FREQUENCY: 2x/week  PT DURATION: 8 weeks  PLANNED INTERVENTIONS: 97164- PT Re-evaluation, 97110-Therapeutic exercises, 97530- Therapeutic activity, [id number]- Neuromuscular re-education, 97535- Self Care, 16109- Manual therapy, [id number]- Gait training, [phone]- Aquatic Therapy, 97014- Electrical stimulation (unattended), [phone]- Traction (mechanical), Patient/Family education, Balance training, Taping, Dry Needling, Joint mobilization, Spinal mobilization, Cryotherapy, and Moist heat.  PLAN FOR NEXT SESSION: work SLS, body mechanics, squats, lumbar ROM, flexion biased exercises, core strength, LE strengthening, gait, balance, manual/DN prn   Sims Duck, PTA 01/06/2024, 11:51 AM

## 2024-01-07 ENCOUNTER — Other Ambulatory Visit: Payer: Self-pay

## 2024-01-07 ENCOUNTER — Encounter (HOSPITAL_BASED_OUTPATIENT_CLINIC_OR_DEPARTMENT_OTHER): Payer: Self-pay | Admitting: Plastic Surgery

## 2024-01-07 ENCOUNTER — Ambulatory Visit (HOSPITAL_BASED_OUTPATIENT_CLINIC_OR_DEPARTMENT_OTHER): Payer: MEDICAID | Admitting: Certified Registered"

## 2024-01-07 ENCOUNTER — Encounter (HOSPITAL_BASED_OUTPATIENT_CLINIC_OR_DEPARTMENT_OTHER)
Admission: RE | Disposition: A | Discharge status: Home / Self Care | Payer: Self-pay | Source: Home / Self Care | Attending: Plastic Surgery

## 2024-01-07 ENCOUNTER — Ambulatory Visit (HOSPITAL_BASED_OUTPATIENT_CLINIC_OR_DEPARTMENT_OTHER)
Admission: RE | Admit: 2024-01-07 | Discharge: 2024-01-07 | Disposition: A | Discharge status: Home / Self Care | Payer: MEDICAID | Attending: Plastic Surgery | Admitting: Plastic Surgery

## 2024-01-07 DIAGNOSIS — N62 Hypertrophy of breast: Secondary | ICD-10-CM

## 2024-01-07 DIAGNOSIS — M549 Dorsalgia, unspecified: Secondary | ICD-10-CM | POA: Insufficient documentation

## 2024-01-07 DIAGNOSIS — M542 Cervicalgia: Secondary | ICD-10-CM | POA: Diagnosis not present

## 2024-01-07 DIAGNOSIS — Z419 Encounter for procedure for purposes other than remedying health state, unspecified: Secondary | ICD-10-CM

## 2024-01-07 DIAGNOSIS — M797 Fibromyalgia: Secondary | ICD-10-CM | POA: Diagnosis not present

## 2024-01-07 DIAGNOSIS — Z7985 Long-term (current) use of injectable non-insulin antidiabetic drugs: Secondary | ICD-10-CM | POA: Insufficient documentation

## 2024-01-07 DIAGNOSIS — E119 Type 2 diabetes mellitus without complications: Secondary | ICD-10-CM | POA: Diagnosis not present

## 2024-01-07 HISTORY — DX: Rheumatoid vasculitis with rheumatoid arthritis of unspecified site: M05.20

## 2024-01-07 HISTORY — PX: BREAST REDUCTION SURGERY: SHX8

## 2024-01-07 LAB — GLUCOSE, CAPILLARY
Glucose-Capillary: 108 mg/dL — ABNORMAL HIGH (ref 70–99)
Glucose-Capillary: 131 mg/dL — ABNORMAL HIGH (ref 70–99)

## 2024-01-07 LAB — POCT PREGNANCY, URINE: Preg Test, Ur: NEGATIVE

## 2024-01-07 SURGERY — BREAST REDUCTION WITH LIPOSUCTION
Anesthesia: General | Laterality: Bilateral

## 2024-01-07 MED ORDER — DEXAMETHASONE SODIUM PHOSPHATE 4 MG/ML IJ SOLN
INTRAMUSCULAR | Status: DC | PRN
  Administered 2024-01-07: 5 mg via INTRAVENOUS

## 2024-01-07 MED ORDER — EPHEDRINE SULFATE (PRESSORS) 50 MG/ML IJ SOLN
INTRAMUSCULAR | Status: DC | PRN
  Administered 2024-01-07: 10 mg via INTRAVENOUS
  Administered 2024-01-07: 15 mg via INTRAVENOUS

## 2024-01-07 MED ORDER — BUPIVACAINE LIPOSOME 1.3 % IJ SUSP
INTRAMUSCULAR | Status: AC
  Filled 2024-01-07: qty 20

## 2024-01-07 MED ORDER — HYDROMORPHONE HCL 1 MG/ML IJ SOLN: 0.2500 mg | INTRAMUSCULAR | Status: DC | PRN

## 2024-01-07 MED ORDER — SCOPOLAMINE 1 MG/3DAYS TD PT72
1.0000 | MEDICATED_PATCH | Freq: Once | TRANSDERMAL | Status: DC
  Administered 2024-01-07: 1.5 mg via TRANSDERMAL

## 2024-01-07 MED ORDER — LACTATED RINGERS IV SOLN: INTRAVENOUS | Status: DC

## 2024-01-07 MED ORDER — SCOPOLAMINE 1 MG/3DAYS TD PT72
MEDICATED_PATCH | TRANSDERMAL | Status: AC
  Filled 2024-01-07: qty 1

## 2024-01-07 MED ORDER — ACETAMINOPHEN 500 MG PO TABS
1000.0000 mg | ORAL_TABLET | Freq: Once | ORAL | Status: AC
  Administered 2024-01-07: 1000 mg via ORAL

## 2024-01-07 MED ORDER — FENTANYL CITRATE (PF) 100 MCG/2ML IJ SOLN
25.0000 ug | INTRAMUSCULAR | Status: DC | PRN
Start: 2024-01-07 — End: 2024-01-07

## 2024-01-07 MED ORDER — VASHE WOUND IRRIGATION OPTIME
TOPICAL | Status: DC | PRN
  Administered 2024-01-07: 34 [oz_av]

## 2024-01-07 MED ORDER — CEFAZOLIN SODIUM-DEXTROSE 2-4 GM/100ML-% IV SOLN: 2.0000 g | INTRAVENOUS | Status: DC

## 2024-01-07 MED ORDER — ONDANSETRON HCL 4 MG/2ML IJ SOLN
INTRAMUSCULAR | Status: AC
  Filled 2024-01-07: qty 2

## 2024-01-07 MED ORDER — HYDROMORPHONE HCL 1 MG/ML IJ SOLN
INTRAMUSCULAR | Status: DC | PRN
  Administered 2024-01-07: .5 mg via INTRAVENOUS

## 2024-01-07 MED ORDER — OXYCODONE HCL 5 MG PO TABS
5.0000 mg | ORAL_TABLET | ORAL | Status: DC | PRN
Start: 2024-01-07 — End: 2024-01-07

## 2024-01-07 MED ORDER — EPINEPHRINE PF 1 MG/ML IJ SOLN
INTRAMUSCULAR | Status: AC
  Filled 2024-01-07: qty 2

## 2024-01-07 MED ORDER — SUGAMMADEX SODIUM 200 MG/2ML IV SOLN
INTRAVENOUS | Status: DC | PRN
  Administered 2024-01-07: 200 mg via INTRAVENOUS

## 2024-01-07 MED ORDER — ONDANSETRON HCL 4 MG/2ML IJ SOLN
INTRAMUSCULAR | Status: DC | PRN
  Administered 2024-01-07: 4 mg via INTRAVENOUS

## 2024-01-07 MED ORDER — PROPOFOL 10 MG/ML IV BOLUS
INTRAVENOUS | Status: AC
  Filled 2024-01-07: qty 20

## 2024-01-07 MED ORDER — SODIUM CHLORIDE 0.9% FLUSH: 3.0000 mL | Freq: Two times a day (BID) | INTRAVENOUS | Status: DC

## 2024-01-07 MED ORDER — HYDROMORPHONE HCL 1 MG/ML IJ SOLN
INTRAMUSCULAR | Status: AC
  Filled 2024-01-07: qty 0.5

## 2024-01-07 MED ORDER — LIDOCAINE HCL (PF) 1 % IJ SOLN
INTRAMUSCULAR | Status: AC
  Filled 2024-01-07: qty 120

## 2024-01-07 MED ORDER — BUPIVACAINE LIPOSOME 1.3 % IJ SUSP: INTRAMUSCULAR | Status: DC | PRN

## 2024-01-07 MED ORDER — CHLORHEXIDINE GLUCONATE CLOTH 2 % EX PADS: 6.0000 | MEDICATED_PAD | Freq: Once | CUTANEOUS | Status: DC

## 2024-01-07 MED ORDER — PROPOFOL 10 MG/ML IV BOLUS
INTRAVENOUS | Status: DC | PRN
  Administered 2024-01-07: 180 mg via INTRAVENOUS

## 2024-01-07 MED ORDER — BUPIVACAINE HCL (PF) 0.25 % IJ SOLN
INTRAMUSCULAR | Status: AC
  Filled 2024-01-07: qty 60

## 2024-01-07 MED ORDER — FENTANYL CITRATE (PF) 100 MCG/2ML IJ SOLN
INTRAMUSCULAR | Status: AC
  Filled 2024-01-07: qty 2

## 2024-01-07 MED ORDER — SODIUM CHLORIDE 0.9 % IV SOLN
INTRAVENOUS | Status: DC | PRN
Start: 2024-01-07 — End: 2024-01-07

## 2024-01-07 MED ORDER — PHENYLEPHRINE HCL-NACL 20-0.9 MG/250ML-% IV SOLN
INTRAVENOUS | Status: DC | PRN
  Administered 2024-01-07: 35 ug/min via INTRAVENOUS

## 2024-01-07 MED ORDER — DEXAMETHASONE SODIUM PHOSPHATE 10 MG/ML IJ SOLN
INTRAMUSCULAR | Status: AC
  Filled 2024-01-07: qty 1

## 2024-01-07 MED ORDER — LIDOCAINE HCL (CARDIAC) PF 100 MG/5ML IV SOSY
PREFILLED_SYRINGE | INTRAVENOUS | Status: DC | PRN
  Administered 2024-01-07: 80 mg via INTRAVENOUS

## 2024-01-07 MED ORDER — SODIUM CHLORIDE 0.9 % IV SOLN
250.0000 mL | INTRAVENOUS | Status: DC | PRN
Start: 2024-01-07 — End: 2024-01-07

## 2024-01-07 MED ORDER — DROPERIDOL 2.5 MG/ML IJ SOLN: 0.6250 mg | Freq: Once | INTRAMUSCULAR | Status: DC | PRN

## 2024-01-07 MED ORDER — ACETAMINOPHEN 325 MG RE SUPP
650.0000 mg | RECTAL | Status: DC | PRN
Start: 2024-01-07 — End: 2024-01-07

## 2024-01-07 MED ORDER — MIDAZOLAM HCL 2 MG/2ML IJ SOLN
INTRAMUSCULAR | Status: AC
  Filled 2024-01-07: qty 2

## 2024-01-07 MED ORDER — FENTANYL CITRATE (PF) 100 MCG/2ML IJ SOLN
INTRAMUSCULAR | Status: DC | PRN
  Administered 2024-01-07: 100 ug via INTRAVENOUS

## 2024-01-07 MED ORDER — ACETAMINOPHEN 500 MG PO TABS
ORAL_TABLET | ORAL | Status: AC
  Filled 2024-01-07: qty 2

## 2024-01-07 MED ORDER — LIDOCAINE 2% (20 MG/ML) 5 ML SYRINGE
INTRAMUSCULAR | Status: AC
  Filled 2024-01-07: qty 5

## 2024-01-07 MED ORDER — SODIUM CHLORIDE (PF) 0.9 % IJ SOLN
INTRAMUSCULAR | Status: DC | PRN
  Administered 2024-01-07: 40 mL

## 2024-01-07 MED ORDER — SODIUM CHLORIDE (PF) 0.9 % IJ SOLN
PREFILLED_SYRINGE | INTRAMUSCULAR | Status: DC | PRN
  Administered 2024-01-07: 500 mL

## 2024-01-07 MED ORDER — LIDOCAINE-EPINEPHRINE 1 %-1:100000 IJ SOLN
INTRAMUSCULAR | Status: AC
  Filled 2024-01-07: qty 2

## 2024-01-07 MED ORDER — ACETAMINOPHEN 325 MG PO TABS
650.0000 mg | ORAL_TABLET | ORAL | Status: DC | PRN
Start: 2024-01-07 — End: 2024-01-07

## 2024-01-07 MED ORDER — LIDOCAINE-EPINEPHRINE 1 %-1:100000 IJ SOLN
INTRAMUSCULAR | Status: DC | PRN
  Administered 2024-01-07: 50 mL via INTRAMUSCULAR

## 2024-01-07 MED ORDER — SODIUM CHLORIDE 0.9% FLUSH: 3.0000 mL | INTRAVENOUS | Status: DC | PRN

## 2024-01-07 MED ORDER — ROCURONIUM BROMIDE 100 MG/10ML IV SOLN
INTRAVENOUS | Status: DC | PRN
  Administered 2024-01-07: 60 mg via INTRAVENOUS

## 2024-01-07 MED ORDER — MIDAZOLAM HCL 5 MG/5ML IJ SOLN
INTRAMUSCULAR | Status: DC | PRN
  Administered 2024-01-07: 2 mg via INTRAVENOUS

## 2024-01-07 MED ORDER — CEFAZOLIN SODIUM-DEXTROSE 2-4 GM/100ML-% IV SOLN
INTRAVENOUS | Status: AC
  Filled 2024-01-07: qty 100

## 2024-01-07 MED ORDER — SODIUM CHLORIDE (PF) 0.9 % IJ SOLN
INTRAMUSCULAR | Status: AC
  Filled 2024-01-07: qty 40

## 2024-01-07 SURGICAL SUPPLY — 63 items
BINDER BREAST LRG (GAUZE/BANDAGES/DRESSINGS) IMPLANT
BINDER BREAST MEDIUM (GAUZE/BANDAGES/DRESSINGS) IMPLANT
BINDER BREAST XLRG (GAUZE/BANDAGES/DRESSINGS) IMPLANT
BINDER BREAST XXLRG (GAUZE/BANDAGES/DRESSINGS) IMPLANT
BIOPATCH RED 1 DISK 7.0 (GAUZE/BANDAGES/DRESSINGS) IMPLANT
BLADE HEX COATED 2.75 (ELECTRODE) IMPLANT
BLADE KNIFE PERSONA 10 (BLADE) ×2 IMPLANT
BLADE SURG 15 STRL LF DISP TIS (BLADE) ×1 IMPLANT
CANISTER SUCT 1200ML W/VALVE (MISCELLANEOUS) ×1 IMPLANT
CLEANSER WND VASHE 34 (WOUND CARE) ×1 IMPLANT
COLLAGEN CELLERATERX 5 GRAM (Miscellaneous) IMPLANT
COVER BACK TABLE 60X90IN (DRAPES) ×1 IMPLANT
COVER MAYO STAND STRL (DRAPES) ×1 IMPLANT
DERMABOND ADVANCED .7 DNX12 (GAUZE/BANDAGES/DRESSINGS) ×2 IMPLANT
DRAIN CHANNEL 19F RND (DRAIN) IMPLANT
DRAPE LAPAROSCOPIC ABDOMINAL (DRAPES) ×1 IMPLANT
DRAPE UTILITY XL STRL (DRAPES) ×1 IMPLANT
DRSG MEPILEX POST OP 4X8 (GAUZE/BANDAGES/DRESSINGS) ×2 IMPLANT
DRSG TEGADERM 4X4.75 (GAUZE/BANDAGES/DRESSINGS) IMPLANT
ELECT BLADE 4.0 EZ CLEAN MEGAD (MISCELLANEOUS) ×1 IMPLANT
ELECT REM PT RETURN 9FT ADLT (ELECTROSURGICAL) ×1 IMPLANT
ELECTRODE BLDE 4.0 EZ CLN MEGD (MISCELLANEOUS) ×1 IMPLANT
ELECTRODE REM PT RTRN 9FT ADLT (ELECTROSURGICAL) ×1 IMPLANT
EVACUATOR SILICONE 100CC (DRAIN) IMPLANT
GAUZE PAD ABD 8X10 STRL (GAUZE/BANDAGES/DRESSINGS) ×2 IMPLANT
GLOVE BIO SURGEON STRL SZ 6.5 (GLOVE) ×2 IMPLANT
GLOVE BIO SURGEON STRL SZ7.5 (GLOVE) ×1 IMPLANT
GLOVE BIOGEL PI IND STRL 7.0 (GLOVE) IMPLANT
GLOVE BIOGEL PI IND STRL 8 (GLOVE) IMPLANT
GOWN STRL REUS W/ TWL LRG LVL3 (GOWN DISPOSABLE) ×2 IMPLANT
GOWN STRL REUS W/ TWL XL LVL3 (GOWN DISPOSABLE) ×1 IMPLANT
LINER CANISTER 1000CC FLEX (MISCELLANEOUS) ×1 IMPLANT
NDL FILTER BLUNT 18X1 1/2 (NEEDLE) IMPLANT
NDL HYPO 25X1 1.5 SAFETY (NEEDLE) ×2 IMPLANT
NEEDLE FILTER BLUNT 18X1 1/2 (NEEDLE) ×1 IMPLANT
NEEDLE HYPO 25X1 1.5 SAFETY (NEEDLE) ×2 IMPLANT
NS IRRIG 1000ML POUR BTL (IV SOLUTION) IMPLANT
PACK BASIN DAY SURGERY FS (CUSTOM PROCEDURE TRAY) ×1 IMPLANT
PAD ALCOHOL SWAB (MISCELLANEOUS) IMPLANT
PAD FOAM SILICONE BACKED (GAUZE/BANDAGES/DRESSINGS) IMPLANT
PENCIL SMOKE EVACUATOR (MISCELLANEOUS) ×1 IMPLANT
PIN SAFETY STERILE (MISCELLANEOUS) IMPLANT
SLEEVE SCD COMPRESS KNEE MED (STOCKING) ×1 IMPLANT
SPIKE FLUID TRANSFER (MISCELLANEOUS) IMPLANT
SPONGE T-LAP 18X18 ~~LOC~~+RFID (SPONGE) ×2 IMPLANT
STRIP SUTURE WOUND CLOSURE 1/2 (MISCELLANEOUS) ×4 IMPLANT
SUT MNCRL AB 4-0 PS2 18 (SUTURE) ×4 IMPLANT
SUT MON AB 3-0 SH27 (SUTURE) ×4 IMPLANT
SUT MON AB 5-0 PS2 18 (SUTURE) IMPLANT
SUT PDS 3-0 CT2 (SUTURE) ×6 IMPLANT
SUT PDS II 3-0 CT2 27 ABS (SUTURE) ×4 IMPLANT
SUT SILK 3 0 PS 1 (SUTURE) IMPLANT
SYR 50ML LL SCALE MARK (SYRINGE) IMPLANT
SYR BULB IRRIG 60ML STRL (SYRINGE) ×1 IMPLANT
SYR CONTROL 10ML LL (SYRINGE) ×2 IMPLANT
TAPE MEASURE VINYL STERILE (MISCELLANEOUS) IMPLANT
TOWEL GREEN STERILE FF (TOWEL DISPOSABLE) ×3 IMPLANT
TRAY DSU PREP LF (CUSTOM PROCEDURE TRAY) ×1 IMPLANT
TUBE CONNECTING 20X1/4 (TUBING) ×1 IMPLANT
TUBING INFILTRATION IT-10001 (TUBING) IMPLANT
TUBING SET GRADUATE ASPIR 12FT (MISCELLANEOUS) IMPLANT
UNDERPAD 30X36 HEAVY ABSORB (UNDERPADS AND DIAPERS) ×2 IMPLANT
YANKAUER SUCT BULB TIP NO VENT (SUCTIONS) ×1 IMPLANT

## 2024-01-07 NOTE — Anesthesia Preprocedure Evaluation (Addendum)
Anesthesia Evaluation  Patient identified by MRN, date of birth, ID band Patient awake    Reviewed: Allergy & Precautions, NPO status , Patient's Chart, lab work & pertinent test results  History of Anesthesia Complications Negative for: history of anesthetic complications  Airway Mallampati: II  TM Distance: >3 FB Neck ROM: Full    Dental no notable dental hx.    Pulmonary asthma    Pulmonary exam normal        Cardiovascular negative cardio ROS Normal cardiovascular exam     Neuro/Psych  Headaches  Anxiety Depression Bipolar Disorder      GI/Hepatic negative GI ROS, Neg liver ROS,,,  Endo/Other  diabetes, Type 2    Renal/GU negative Renal ROS  negative genitourinary   Musculoskeletal  (+) Arthritis ,  Fibromyalgia -  Abdominal   Peds  Hematology negative hematology ROS (+)   Anesthesia Other Findings Symptomatic mammary hypertrophy  Reproductive/Obstetrics negative OB ROS                             Anesthesia Physical Anesthesia Plan  ASA: 2  Anesthesia Plan: General   Post-op Pain Management: Tylenol PO (pre-op)*   Induction: Intravenous  PONV Risk Score and Plan: 3 and Treatment may vary due to age or medical condition, Ondansetron, Dexamethasone, Midazolam and Scopolamine patch - Pre-op  Airway Management Planned: Oral ETT  Additional Equipment: None  Intra-op Plan:   Post-operative Plan: Extubation in OR  Informed Consent: I have reviewed the patients History and Physical, chart, labs and discussed the procedure including the risks, benefits and alternatives for the proposed anesthesia with the patient or authorized representative who has indicated his/her understanding and acceptance.     Dental advisory given  Plan Discussed with: CRNA  Anesthesia Plan Comments:         Anesthesia Quick Evaluation

## 2024-01-07 NOTE — Anesthesia Postprocedure Evaluation (Signed)
Anesthesia Post Note  Patient: Frances [name]  Procedure(s) Performed: Bilateral breast reduction with liposuction (Bilateral)     Patient location during evaluation: PACU Anesthesia Type: General Level of consciousness: awake and alert Pain management: pain level controlled Vital Signs Assessment: post-procedure vital signs reviewed and stable Respiratory status: spontaneous breathing, nonlabored ventilation and respiratory function stable Cardiovascular status: blood pressure returned to baseline Postop Assessment: no apparent nausea or vomiting Anesthetic complications: no   No notable events documented.  Last Vitals:  Vitals:   01/07/24 1045 01/07/24 1120  BP: (!) 147/83 (!) 137/92  Pulse: (!) 129 (!) 125  Resp: 17 16  Temp:  (!) 36.4 C  SpO2: 93% 94%    Last Pain:  Vitals:   01/07/24 1120  TempSrc: Temporal  PainSc:                  Shanda Howells

## 2024-01-07 NOTE — Transfer of Care (Signed)
Immediate Anesthesia Transfer of Care Note  Patient: Frances [name]  Procedure(s) Performed: Bilateral breast reduction with liposuction (Bilateral)  Patient Location: PACU  Anesthesia Type:General  Level of Consciousness: awake, alert , and oriented  Airway & Oxygen Therapy: Patient Spontanous Breathing and Patient connected to face mask oxygen  Post-op Assessment: Report given to RN and Post -op Vital signs reviewed and stable  Post vital signs: Reviewed and stable  Last Vitals:  Vitals Value Taken Time  BP 146/85 01/07/24 1001  Temp    Pulse 120 01/07/24 1005  Resp 18 01/07/24 1005  SpO2 96 % 01/07/24 1005  Vitals shown include unfiled device data.  Last Pain:  Vitals:   01/07/24 0634  TempSrc: Oral  PainSc: 2       Patients Stated Pain Goal: 5 (01/07/24 4034)  Complications: No notable events documented.

## 2024-01-07 NOTE — Interval H&P Note (Signed)
History and Physical Interval Note:  01/07/2024 6:59 AM  Frances [name]  has presented today for surgery, with the diagnosis of Symptomatic mammary hypertrophy.  The various methods of treatment have been discussed with the patient and family. After consideration of risks, benefits and other options for treatment, the patient has consented to  Procedure(s): Bilateral breast reduction with liposuction (Bilateral) as a surgical intervention.  The patient's history has been reviewed, patient examined, no change in status, stable for surgery.  I have reviewed the patient's chart and labs.  Questions were answered to the patient's satisfaction.     Alena Bills [name]

## 2024-01-07 NOTE — Op Note (Addendum)
Breast Reduction Op note:    DATE OF PROCEDURE: 01/07/2024  LOCATION: [facility]  SURGEON: Foster Simpson, DO  ASSISTANT: Caroline More, PA  PREOPERATIVE DIAGNOSIS 1. Macromastia 2. Neck Pain 3. Back Pain  POSTOPERATIVE DIAGNOSIS 1. Macromastia 2. Neck Pain 3. Back Pain  PROCEDURES 1. Bilateral breast reduction.  Right reduction 1095 g, Left reduction 1492 g  COMPLICATIONS: None.  DRAINS: none  INDICATIONS FOR PROCEDURE Frances [name] is a 48 y.o. year-old female born on 1976-06-23,with a history of symptomatic macromastia with concominant back pain, neck pain, shoulder grooving from her bra.   MRN: [id number]  CONSENT Informed consent was obtained directly from the patient. The risks, benefits and alternatives were fully discussed. Specific risks including but not limited to bleeding, infection, hematoma, seroma, scarring, pain, nipple necrosis, asymmetry, poor cosmetic results, and need for further surgery were discussed. The patient's questions were answered.  DESCRIPTION OF PROCEDURE  Patient was brought into the operating room and rested on the operating room table in the supine position.  SCDs were placed and appropriate padding was performed.  Antibiotics were given. The patient underwent general anesthesia and the chest was prepped and draped in a sterile fashion.  A timeout was performed and all information was confirmed to be correct by those in the room. Tumescent was placed in each lateral breast. Liposuction was done laterally on each side for improved contour.   Right side: Preoperative markings were confirmed.  Incision lines were injected with local containing epinephrine.  After waiting for vasoconstriction, the marked lines were incised with a #15 blade.  A Wise-pattern superomedial breast reduction was performed by de-epithelializing the pedicle, using bovie to create the superomedial pedicle, and removing breast tissue from the  superior, lateral, and inferior portions of the breast.  Care was taken to not undermine the breast pedicle. Hemostasis was achieved.  The nipple was gently rotated into position and the soft tissue closed with 4-0 Monocryl.   The pocket was irrigated and hemostasis confirmed.  The deep tissues were approximated with 3-0 PDS sutures.  The skin was closed with deep dermal 3-0 Monocryl.  The nipple and skin flaps had good capillary refill at the end of the procedure.    Left side: Preoperative markings were confirmed.  Incision lines were injected with local containing epinephrine.  After waiting for vasoconstriction, the marked lines were incised with a #15 blade.  A Wise-pattern superomedial breast reduction was performed by de-epithelializing the pedicle, using bovie to create the superomedial pedicle, and removing breast tissue from the superior, lateral, and inferior portions of the breast.  Care was taken to not undermine the breast pedicle. Hemostasis was achieved.  The nipple was gently rotated into position and the soft tissue was closed with 4-0 Monocryl.  The patient was sat upright and size and shape symmetry was confirmed.  The pocket was irrigated and hemostasis confirmed.  Experel and Cellerate were placed in the pocket. The deep tissues were approximated with 3-0 PDS sutures. The skin was closed with deep dermal 3-0 Monocryl.  Dermabond was applied.  A breast binder and ABDs were placed.  The nipple and skin flaps had good capillary refill at the end of the procedure.  The patient tolerated the procedure well. The patient was allowed to wake from anesthesia and taken to the recovery room in satisfactory condition.  The advanced practice practitioner (APP) assisted throughout the case.  The APP was essential in retraction and counter traction when needed to make  the case progress smoothly.  This retraction and assistance made it possible to see the tissue plans for the procedure.  The assistance was  needed for blood control, tissue re-approximation and assisted with closure of the incision site.

## 2024-01-07 NOTE — Discharge Instructions (Addendum)

## 2024-01-08 ENCOUNTER — Encounter (HOSPITAL_BASED_OUTPATIENT_CLINIC_OR_DEPARTMENT_OTHER): Payer: Self-pay | Admitting: Plastic Surgery

## 2024-01-11 ENCOUNTER — Telehealth: Payer: Self-pay

## 2024-01-11 NOTE — Telephone Encounter (Signed)
Patient called with post op care questions.Pt had BL breast reduction with Dr. Ulice Bold on 1/16. Patient admitted she took a shower today and wanted to know if she needed to take any dressings or bandages off. I adv to leave the bandages on and pat dry as best as she can. When she comes to her first post op appt on 1/28 with Dr. Ulice Bold she will take her dressings off. She asked if gauze over the nipples was still appropriate. I adv the guaze can help minimize hypersensitivity if she's experiencing that and it can help the sutures from getting stuck on her binder or inside of her bra. Patient conveyed understanding.

## 2024-01-12 LAB — SURGICAL PATHOLOGY

## 2024-01-15 ENCOUNTER — Other Ambulatory Visit: Payer: Self-pay

## 2024-01-15 ENCOUNTER — Telehealth: Payer: Self-pay

## 2024-01-15 ENCOUNTER — Encounter: Payer: Self-pay | Admitting: Emergency Medicine

## 2024-01-15 ENCOUNTER — Ambulatory Visit
Admission: EM | Admit: 2024-01-15 | Discharge: 2024-01-15 | Disposition: A | Discharge status: Home / Self Care | Payer: MEDICAID

## 2024-01-15 DIAGNOSIS — N644 Mastodynia: Secondary | ICD-10-CM

## 2024-01-15 NOTE — ED Triage Notes (Signed)
Patient states that she had a breast reduction on 01/07/2024.  This morning while taken a shower, she began to have left sided breast pain that radiates around to her back.  Hard to tell if there's any redness.  Patient has taken Oxycodone and Tylenol, Diclofenac daily.

## 2024-01-15 NOTE — Discharge Instructions (Addendum)
Advised patient if symptoms worsen and/or unresolved please follow-up with surgeon for further evaluation.

## 2024-01-15 NOTE — Telephone Encounter (Signed)
Pt left voicemail @ 4:46 pm c/o increased pain on side of L breast. Had breast reduction on 1/16. Took a shower yesterday and pain has increased.

## 2024-01-15 NOTE — ED Provider Notes (Signed)
[facility]    CSN: [id number] Arrival date & time: 01/15/24  1831      History   Chief Complaint Chief Complaint  Patient presents with   Breast Pain    HPI Frances [name] is a 48 y.o. female.   HPI 48 year old female presents with postsurgical pain.  Reports having breast reduction performed on 01/07/2024.  Reports while taking a shower this morning she began having left-sided breast pain that radiates around to her back.  Reports hard to tell if there is any redness.  Patient reports she is currently taking oxycodone, Tylenol and diclofenac.  Past Medical History:  Diagnosis Date   Anxiety    Arthritis    Back pain, chronic    Bipolar 1 disorder (HCC)    Chronic fatigue syndrome    DDD (degenerative disc disease), lumbar    Depression    Diabetes mellitus without complication (HCC)    Fibromyalgia    IUD 2012   Mirena   MTHFR gene mutation    Rheumatoid arteritis [facility])     Patient Active Problem List   Diagnosis Date Noted   Pain in left third proximal interphalangeal joint 10/16/2023   Personal history UTI 09/14/2023   Rheumatoid arthritis 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 (HCC) 09/14/2023   Urinary hesitancy 06/30/2023   Urinary frequency 06/16/2023   OAB (overactive bladder) 06/16/2023   Chronic pain syndrome 06/10/2023   Crush injury of right foot 05/25/2023   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   Microalbuminuria 09/09/2022   Night sweats 06/18/2022   Proteinuria 03/17/2022   Chronic right hip pain 01/06/2022   Acute pain of right shoulder 01/06/2022   ETD (Eustachian tube dysfunction), bilateral 08/23/2021   SOB (shortness of breath) on exertion 08/20/2021   Overweight (BMI 25.0-29.9) 06/18/2021   Attention deficit hyperactivity disorder (ADHD), 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   Hyperlipidemia LDL goal <70 03/18/2021   Diabetes mellitus (HCC) 03/13/2021   Seasonal allergies 12/25/2020   Bilateral leg  edema 11/19/2020   Symptomatic mammary hypertrophy 09/04/2020   Recurrent major depressive disorder, in partial remission (HCC) 05/24/2020   Generalized anxiety disorder 05/24/2020   DDD (degenerative disc disease), cervical 03/13/2020   Ingrown right big toenail 03/13/2020   Labral tear of right hip joint, degenerative 01/11/2020   Grief reaction 11/28/2019   Elevated fasting glucose 04/08/2018   Borderline personality disorder (HCC) 01/22/2018   Seronegative rheumatoid arthritis (HCC) 12/09/2017   New daily persistent headache 09/26/2017   MTHFR gene mutation 09/20/2017   Bipolar I disorder (HCC) 09/16/2017   Chronic fatigue syndrome 09/16/2017   Back pain 10/13/2008   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   Lumbar spondylosis 02/10/2008   FIBROMYALGIA 02/10/2008   Other specified abnormal findings of blood chemistry 02/10/2008    Past Surgical History:  Procedure Laterality Date   BREAST REDUCTION SURGERY Bilateral 01/07/2024   Procedure: Bilateral breast reduction with liposuction;  Surgeon: Peggye Form, DO;  Location: [facility];  Service: Plastics;  Laterality: Bilateral;   COLONOSCOPY WITH PROPOFOL N/A 02/25/2017   Procedure: COLONOSCOPY WITH PROPOFOL;  Surgeon: Willis Modena, MD;  Location: [facility];  Service: Endoscopy;  Laterality: N/A;   left knee lateral release  1993   scar tisue removal  03/2005   left ankle   TONSILLECTOMY  age 58's    OB History     Gravida  0   Para  0   Term  0   Preterm  0  AB  0   Living  0      SAB  0   IAB  0   Ectopic  0   Multiple  0   Live Births               Home Medications    Prior to Admission medications   Medication Sig Start Date End Date Taking? Authorizing Provider  albuterol (VENTOLIN HFA) 108 (90 Base) MCG/ACT inhaler Take 2 puffs 15 minutes before exercise and as needed for shortness of breath. 08/27/21  Yes Breeback, Jade L, PA-C  ARIPiprazole (ABILIFY) 15 MG tablet  Take 1 tablet (15 mg total) by mouth daily. 11/11/23  Yes Toy Cookey E, NP  atorvastatin (LIPITOR) 20 MG tablet TAKE 1 TABLET BY MOUTH DAILY 11/02/23  Yes Breeback, Jade L, PA-C  Azelastine HCl 137 MCG/SPRAY SOLN USE 2 SPRAYS ALTERNATE NOSTRILS TWICE A DAY 11/02/23  Yes Breeback, Jade L, PA-C  baclofen (LIORESAL) 10 MG tablet TAKE 1 TABLET BY MOUTH TWICE A DAY AS NEEDED FOR MUSCLE SPASMS 11/02/23  Yes Breeback, Jade L, PA-C  benztropine (COGENTIN) 0.5 MG tablet Take 1 tablet (0.5 mg total) by mouth 2 (two) times daily. 11/11/23  Yes Toy Cookey E, NP  blood glucose meter kit and supplies KIT Dispense based on patient and insurance preference. Use up to four times daily as directed. 05/29/21  Yes Breeback, Jade L, PA-C  buPROPion (WELLBUTRIN XL) 300 MG 24 hr tablet Take 1 tablet (300 mg total) by mouth daily. 11/11/23  Yes Toy Cookey E, NP  cholecalciferol (VITAMIN D3) 25 MCG (1000 UT) tablet Take 1,000 Units by mouth daily.   Yes [provider]  diclofenac (VOLTAREN) 75 MG EC tablet Take 1 tablet (75 mg total) by mouth 2 (two) times daily. 10/19/22  Yes Eustace Moore, MD  diclofenac Sodium (VOLTAREN) 1 % GEL Apply 2 g topically 4 (four) times daily. To affected joint. 10/16/23  Yes Monica Becton, MD  DULoxetine HCl 30 MG CSDR Take 90 mg by mouth daily.   Yes [provider]  fluticasone (FLONASE) 50 MCG/ACT nasal spray USE 2 SPRAYS IN EACH NOSRIL DAILY 11/02/23  Yes Breeback, Jade L, PA-C  HYDROcodone-acetaminophen (NORCO/VICODIN) 5-325 MG tablet Take 1 tablet by mouth 2 (two) times daily as needed for moderate pain (pain score 4-6) (back pain). 12/08/23  Yes Breeback, Jade L, PA-C  hydroxychloroquine (PLAQUENIL) 200 MG tablet Take 200 mg by mouth 2 (two) times daily. 200mg  twice daily on weekdays and 200mg  once daily on weekends as of 12/03/22 review   Yes [provider]  levocetirizine (XYZAL) 5 MG tablet TAKE 1 TABLET BY MOUTH EVERY  EVENING 07/24/23  Yes Breeback, Jade L, PA-C  montelukast (SINGULAIR) 10 MG tablet Take 1 tablet (10 mg total) by mouth at bedtime. 07/13/23  Yes Breeback, Jade L, PA-C  Multiple Vitamin (MULTIVITAMIN WITH MINERALS) TABS tablet Take 1 tablet by mouth daily.   Yes [provider]  OZEMPIC, 2 MG/DOSE, 8 MG/3ML SOPN INJECT 2 MG AS DIRECTED ONCE A WEEK. 12/01/23  Yes Breeback, Jade L, PA-C  pregabalin (LYRICA) 200 MG capsule TAKE 1 CAPSULE BY MOUTH 2-3 TIMES DAILY 10/02/23  Yes Monica Becton, MD  solifenacin (VESICARE) 10 MG tablet Take 1 tablet (10 mg total) by mouth daily. 08/13/23  Yes Stoneking, Danford Bad., MD  ondansetron (ZOFRAN-ODT) 4 MG disintegrating tablet Take 1 tablet (4 mg total) by mouth every 8 (eight) hours as needed for nausea or  vomiting. 12/10/23   Lorelee New, PA-C  predniSONE (DELTASONE) 50 MG tablet One tab PO daily for 5 days. 12/17/23   Monica Becton, MD    Family History Family History  Problem Relation Age of Onset   Diabetes Mother    Stroke Mother    Lupus Mother    Hypertension Mother    Congestive Heart Failure Mother    Mental illness Brother        Not clear what his diagnosis is--may be related to previous drug use.   Cancer Maternal Grandmother        colon   Diabetes Maternal Grandfather    Depression Maternal Uncle    Alcohol abuse Maternal Uncle     Social History Social History   Tobacco Use   Smoking status: Never   Smokeless tobacco: Never  Vaping Use   Vaping status: Never Used  Substance Use Topics   Alcohol use: No    Alcohol/week: 0.0 standard drinks of alcohol   Drug use: No     Allergies   Metaxalone   Review of Systems Review of Systems  Musculoskeletal:        Left breast pain secondary to surgical incision     Physical Exam Triage Vital Signs ED Triage Vitals  Encounter Vitals Group     BP 01/15/24 1846 (!) 142/90     Systolic BP Percentile --      Diastolic BP Percentile --      Pulse  Rate 01/15/24 1846 84     Resp 01/15/24 1846 18     Temp 01/15/24 1846 98.3 F (36.8 C)     Temp Source 01/15/24 1846 Oral     SpO2 01/15/24 1846 98 %     Weight 01/15/24 1848 183 lb (83 kg)     Height 01/15/24 1848 5\' 7"  (1.702 m)     Head Circumference --      Peak Flow --      Pain Score 01/15/24 1847 4     Pain Loc --      Pain Education --      Exclude from Growth Chart --    No data found.  Updated Vital Signs BP (!) 142/90 (BP Location: Right Arm)   Pulse 84   Temp 98.3 F (36.8 C) (Oral)   Resp 18   Ht 5\' 7"  (1.702 m)   Wt 183 lb (83 kg)   SpO2 98%   BMI 28.66 kg/m     Physical Exam Vitals and nursing note reviewed. Exam conducted with a chaperone present.  Constitutional:      General: She is not in acute distress.    Appearance: Normal appearance. She is normal weight. She is not ill-appearing.  HENT:     Head: Normocephalic and atraumatic.     Mouth/Throat:     Mouth: Mucous membranes are moist.     Pharynx: Oropharynx is clear.  Eyes:     Extraocular Movements: Extraocular movements intact.     Conjunctiva/sclera: Conjunctivae normal.     Pupils: Pupils are equal, round, and reactive to light.  Cardiovascular:     Rate and Rhythm: Normal rate and regular rhythm.     Pulses: Normal pulses.     Heart sounds: Normal heart sounds.  Musculoskeletal:        General: Normal range of motion.  Skin:    General: Skin is warm and dry.     Comments: Left breast (inferior mammary fold):  Well-healing surgical area noted, nonerythematous, nonindurated, nonfluctuant, no lymphatic streaking-please see image below  Neurological:     General: No focal deficit present.     Mental Status: She is alert and oriented to person, place, and time. Mental status is at baseline.      UC Treatments / Results  Labs (all labs ordered are listed, but only abnormal results are displayed) Labs Reviewed - No data to display  EKG   Radiology No results  found.  Procedures Procedures (including critical care time)  Medications Ordered in UC Medications - No data to display  Initial Impression / Assessment and Plan / UC Course  I have reviewed the triage vital signs and the nursing notes.  Pertinent labs & imaging results that were available during my care of the patient were reviewed by me and considered in my medical decision making (see chart for details).     MDM: 1.  Breast pain, left-surgical area looks well-healing and free of infection patient reports pain is controlled with current pain regimen.  Advised patient if symptoms worsen and/or unresolved please call on call surgical center for further evaluation.  Patient is scheduled for first postop follow-up on Tuesday, 01/19/2024.  Patient discharged home, hemodynamically stable. Final Clinical Impressions(s) / UC Diagnoses   Final diagnoses:  Breast pain, left     Discharge Instructions      Advised patient if symptoms worsen and/or unresolved please follow-up with surgeon for further evaluation.     ED Prescriptions   None    PDMP not reviewed this encounter.   Trevor Iha, FNP 01/15/24 1916

## 2024-01-18 NOTE — Telephone Encounter (Signed)
Patient was seen in urgent care on 01/15/24, reviewed note. No concerns per provider. She is scheduled to be seen tomorrow in office. Will call patient today to follow up.

## 2024-01-19 ENCOUNTER — Ambulatory Visit (INDEPENDENT_AMBULATORY_CARE_PROVIDER_SITE_OTHER): Payer: MEDICAID | Admitting: Plastic Surgery

## 2024-01-19 VITALS — BP 130/89 | HR 117

## 2024-01-19 DIAGNOSIS — N62 Hypertrophy of breast: Secondary | ICD-10-CM

## 2024-01-19 NOTE — Progress Notes (Signed)
The patient is a 48 year old female here for follow-up from breast reduction surgery that was done on January 07, 2024.  The patient had over 1000 g removed from the right breast and 1400 from the left breast.  I was able to remove the dressings.  She has some bruising and swelling as expected but no sign of seroma or hematoma.  No sign of infection.  Overall she is doing well and she looks really good.  Will plan on seeing her back in 3 weeks.  Pictures were obtained of the patient and placed in the chart with the patient's or guardian's permission.

## 2024-01-21 ENCOUNTER — Telehealth (HOSPITAL_COMMUNITY): Payer: MEDICAID | Admitting: Psychiatry

## 2024-01-21 ENCOUNTER — Encounter (HOSPITAL_COMMUNITY): Payer: Self-pay | Admitting: Psychiatry

## 2024-01-21 DIAGNOSIS — F900 Attention-deficit hyperactivity disorder, predominantly inattentive type: Secondary | ICD-10-CM | POA: Diagnosis not present

## 2024-01-21 DIAGNOSIS — F319 Bipolar disorder, unspecified: Secondary | ICD-10-CM

## 2024-01-21 MED ORDER — BUPROPION HCL ER (XL) 300 MG PO TB24: 300.0000 mg | ORAL_TABLET | Freq: Every day | ORAL | 3 refills | Status: DC

## 2024-01-21 MED ORDER — ARIPIPRAZOLE 15 MG PO TABS: 15.0000 mg | ORAL_TABLET | Freq: Every day | ORAL | 3 refills | Status: DC

## 2024-01-21 NOTE — Progress Notes (Signed)
Marland KitchenMarland KitchenMarland Kitchen.......................................BH MD/PA/NP OP Progress Note Virtual Visit via Video Note  I connected with Frances [name] on 01/21/24 at  2:00 PM EST by a video enabled telemedicine application and verified that I am speaking with the correct person using two identifiers.  Location: Patient: Home Provider: Clinic   I discussed the limitations of evaluation and management by telemedicine and the availability of in person appointments. The patient expressed understanding and agreed to proceed.  I provided 30 minutes of non-face-to-face time during this encounter.              01/21/2024 2:17 PM Frances [name]  MRN:  [id number]  Chief Complaint: "I want to go off cogentin"  HPI: 48 year old female seen today for follow up psychiatric evaluation. She has a psychiatric history of bipolar 1, borderline personality disorder, depression, and anxiety. She is currently managed on Abilify 15 mg nightly,  Wellbutrin 300 we will mg daily, Cogentin 0.5 mg twice daily.  And Cymbalta 90 mg daily (from rheumatologist). Today, patient notes that medications are effective in managing her symptoms.   Today the patient was well-groomed, pleasant, cooperative, engaged in eye contact, and engaged in conversation. She informed Clinical research associate that she would like to stop Cogentin.  She notes that she is not taking it in over a month and has not noticed side effects.  At patient's last visit she informed writer that she has shaking in her hands.  She denies that today.    Since her last visit she informed writer that her anxiety and depression continues to be well-managed.  Today provider conducted a GAD-7 and patient scored a 1.  Provider also conducted PHQ-9 he scored a 8.  She endorsed adequate sleep and appetite.  Today she denies SI/HI/AVH, mania, paranoia.    Patient informed writer that she had a breast reduction and is healing well.  She denies any pain.    Today Cogentin discontinued.   No other medication changes made today.  Patient agreeable to continue medications as prescribed.  No other concerns noted at this time.      Visit Diagnosis:    ICD-10-CM   1. Bipolar I disorder (HCC)  F31.9 ARIPiprazole (ABILIFY) 15 MG tablet    buPROPion (WELLBUTRIN XL) 300 MG 24 hr tablet    2. Attention deficit hyperactivity disorder (ADHD), predominantly inattentive type  F90.0 buPROPion (WELLBUTRIN XL) 300 MG 24 hr tablet         Past Psychiatric History: Bipolar, anxiety, and depression Past Medical History:  Past Medical History:  Diagnosis Date   Anxiety    Arthritis    Back pain, chronic    Bipolar 1 disorder (HCC)    Chronic fatigue syndrome    DDD (degenerative disc disease), lumbar    Depression    Diabetes mellitus without complication (HCC)    Fibromyalgia    IUD 2012   Mirena   MTHFR gene mutation    Rheumatoid arteritis (HCC)     Past Surgical History:  Procedure Laterality Date   BREAST REDUCTION SURGERY Bilateral 01/07/2024   Procedure: Bilateral breast reduction with liposuction;  Surgeon: Peggye Form, DO;  Location: [facility];  Service: Plastics;  Laterality: Bilateral;   COLONOSCOPY WITH PROPOFOL N/A 02/25/2017   Procedure: COLONOSCOPY WITH PROPOFOL;  Surgeon: Willis Modena, MD;  Location: [facility];  Service: Endoscopy;  Laterality: N/A;   left knee lateral release  1993   scar tisue removal  03/2005   left ankle  TONSILLECTOMY  age 48's    Family Psychiatric History: Maternal aunts Bipolar disorder and uncle alcoholic  Family History:  Family History  Problem Relation Age of Onset   Diabetes Mother    Stroke Mother    Lupus Mother    Hypertension Mother    Congestive Heart Failure Mother    Mental illness Brother        Not clear what his diagnosis is--may be related to previous drug use.   Cancer Maternal Grandmother        colon   Diabetes Maternal Grandfather    Depression Maternal Uncle    Alcohol  abuse Maternal Uncle     Social History:  Social History   Socioeconomic History   Marital status: Single    Spouse name: Not on file   Number of children: 0   Years of education: Not on file   Highest education level: Bachelor's degree (e.g., BA, AB, BS)  Occupational History   Occupation: Airline pilot at times  Tobacco Use   Smoking status: Never   Smokeless tobacco: Never  Vaping Use   Vaping status: Never Used  Substance and Sexual Activity   Alcohol use: No    Alcohol/week: 0.0 standard drinks of alcohol   Drug use: No   Sexual activity: Not on file  Other Topics Concern   Not on file  Social History Narrative   Originally from [place], outside of [place].    Moved here permanently 2012.   Intermittently worked on a farm.   Lives with many cats--3 plus fosters cats.    Single, lives alone in a one story home. Rarely drinks caffeine. 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   Social Drivers of Health   Financial Resource Strain: Medium Risk (06/12/2023)   Overall Financial Resource Strain (CARDIA)    Difficulty of Paying Living Expenses: Somewhat hard  Food Insecurity: Food Insecurity Present (06/12/2023)   Hunger Vital Sign    Worried About Running Out of Food in the Last Year: Sometimes true    Ran Out of Food in the Last Year: Never true  Transportation Needs: No Transportation Needs (06/12/2023)   PRAPARE - Administrator, Civil Service (Medical): No    Lack of Transportation (Non-Medical): No  Physical Activity: Insufficiently Active (07/27/2023)   Exercise Vital Sign    Days of Exercise per Week: 3 days    Minutes of Exercise per Session: 30 min  Stress: No Stress Concern Present (06/12/2023)   [employer] of Occupational Health - Occupational Stress Questionnaire    Feeling of Stress : Only a little  Social Connections: Socially Isolated (06/12/2023)   Social Connection and Isolation Panel [NHANES]    Frequency of  Communication with Friends and Family: Never    Frequency of Social Gatherings with Friends and Family: Twice a week    Attends Religious Services: Never    Database administrator or Organizations: Yes    Attends Engineer, structural: More than 4 times per year    Marital Status: Never married    Allergies:  Allergies  Allergen Reactions   Metaxalone Hives    Metabolic Disorder Labs: Lab Results  Component Value Date   HGBA1C 5.2 12/09/2023   MPG 103 04/14/2018   Lab Results  Component Value Date   PROLACTIN 16.4 09/02/2023   Lab Results  Component Value Date   CHOL 132 09/02/2023   TRIG 201 (H) 09/02/2023   HDL  39 (L) 09/02/2023   CHOLHDL 3.4 09/02/2023   LDLCALC 60 09/02/2023   LDLCALC 41 09/10/2022   Lab Results  Component Value Date   TSH 1.030 09/02/2023   TSH 0.866 11/21/2020    Therapeutic Level Labs: No results found for: "LITHIUM" No results found for: "VALPROATE" No results found for: "CBMZ"  Current Medications: Current Outpatient Medications  Medication Sig Dispense Refill   albuterol (VENTOLIN HFA) 108 (90 Base) MCG/ACT inhaler Take 2 puffs 15 minutes before exercise and as needed for shortness of breath. 6.7 g 1   ARIPiprazole (ABILIFY) 15 MG tablet Take 1 tablet (15 mg total) by mouth daily. 30 tablet 3   atorvastatin (LIPITOR) 20 MG tablet TAKE 1 TABLET BY MOUTH DAILY 60 tablet 0   Azelastine HCl 137 MCG/SPRAY SOLN USE 2 SPRAYS ALTERNATE NOSTRILS TWICE A DAY 30 mL 2   baclofen (LIORESAL) 10 MG tablet TAKE 1 TABLET BY MOUTH TWICE A DAY AS NEEDED FOR MUSCLE SPASMS 180 tablet 1   blood glucose meter kit and supplies KIT Dispense based on patient and insurance preference. Use up to four times daily as directed. 1 each 0   buPROPion (WELLBUTRIN XL) 300 MG 24 hr tablet Take 1 tablet (300 mg total) by mouth daily. 30 tablet 3   cholecalciferol (VITAMIN D3) 25 MCG (1000 UT) tablet Take 1,000 Units by mouth daily.     diclofenac (VOLTAREN) 75 MG  EC tablet Take 1 tablet (75 mg total) by mouth 2 (two) times daily. 60 tablet 0   diclofenac Sodium (VOLTAREN) 1 % GEL Apply 2 g topically 4 (four) times daily. To affected joint. 100 g 11   DULoxetine HCl 30 MG CSDR Take 90 mg by mouth daily.     fluticasone (FLONASE) 50 MCG/ACT nasal spray USE 2 SPRAYS IN EACH NOSRIL DAILY 16 g 0   HYDROcodone-acetaminophen (NORCO/VICODIN) 5-325 MG tablet Take 1 tablet by mouth 2 (two) times daily as needed for moderate pain (pain score 4-6) (back pain). 60 tablet 0   hydroxychloroquine (PLAQUENIL) 200 MG tablet Take 200 mg by mouth 2 (two) times daily. 200mg  twice daily on weekdays and 200mg  once daily on weekends as of 12/03/22 review     levocetirizine (XYZAL) 5 MG tablet TAKE 1 TABLET BY MOUTH EVERY EVENING 90 tablet 1   montelukast (SINGULAIR) 10 MG tablet Take 1 tablet (10 mg total) by mouth at bedtime. 90 tablet 3   Multiple Vitamin (MULTIVITAMIN WITH MINERALS) TABS tablet Take 1 tablet by mouth daily.     ondansetron (ZOFRAN-ODT) 4 MG disintegrating tablet Take 1 tablet (4 mg total) by mouth every 8 (eight) hours as needed for nausea or vomiting. 20 tablet 0   OZEMPIC, 2 MG/DOSE, 8 MG/3ML SOPN INJECT 2 MG AS DIRECTED ONCE A WEEK. 9 mL 0   predniSONE (DELTASONE) 50 MG tablet One tab PO daily for 5 days. 5 tablet 0   pregabalin (LYRICA) 200 MG capsule TAKE 1 CAPSULE BY MOUTH 2-3 TIMES DAILY 90 capsule 3   solifenacin (VESICARE) 10 MG tablet Take 1 tablet (10 mg total) by mouth daily. 30 tablet 11   No current facility-administered medications for this visit.     Musculoskeletal: Strength & Muscle Tone: within normal limits and  telehealth visit Gait & Station: normal, telehealth visit Patient leans: N/A  Psychiatric Specialty Exam: Review of Systems  There were no vitals taken for this visit.There is no height or weight on file to calculate BMI.  General Appearance: Well Groomed  Eye Contact:  Good  Speech:  Clear and Coherent and Normal Rate   Volume:  Normal  Mood:  Euthymic  Affect:  Congruent  Thought Process:  Coherent, Goal Directed and Linear  Orientation:  Full (Time, Place, and Person)  Thought Content: WDL and Logical   Suicidal Thoughts:  No  Homicidal Thoughts:  No  Memory:  Immediate;   Good Recent;   Good Remote;   Good  Judgement:  Good  Insight:  Good  Psychomotor Activity:  Normal  Concentration:  Concentration: Good and Attention Span: Good  Recall:  Good  Fund of Knowledge: Good  Language: Good  Akathisia:  No  Handed:  Right  AIMS (if indicated): not done  Assets:  Communication Skills Desire for Improvement Financial Resources/Insurance Housing Social Support  ADL's:  Intact  Cognition: WNL  Sleep:  Good   Screenings: AIMS    Flowsheet Row Clinical Support from 06/30/2023 in [facility]  AIMS Total Score 5 0      AUDIT    Flowsheet Row [facility]  Alcohol Use Disorder Identification Test Final Score (AUDIT) 0      GAD-7    Flowsheet Row Video Visit from 01/21/2024 in [facility] Office Visit from 09/11/2023 in [facility] Video Visit from 09/10/2023 in [facility] Counselor from 07/27/2023 in [facility] Clinical Support from 06/30/2023 in [facility]  Total GAD-7 Score 1 2 3 4 2       PHQ2-9    Flowsheet Row Video Visit from 01/21/2024 in [facility] Office Visit from 09/11/2023 in [facility] Primary Care & Sports Medicine at [facility] Video Visit from 09/10/2023 in [facility] Counselor from 07/27/2023 in [facility] Office Visit from  07/03/2023 in [facility] Primary Care & Sports Medicine at [facility]  PHQ-2 Total Score 2 2 1 2 2   PHQ-9 Total Score 8 9 8 10  --      Flowsheet Row Video Visit from 01/21/2024 in [facility] ED from 01/15/2024 in [facility] Admission (Discharged) from 01/07/2024 in [facility]  C-SSRS RISK CATEGORY No Risk No Risk No Risk        Assessment and Plan: Patient notes that he is doing well on his current medication regimen.  She informed Clinical research associate that she would like to discontinue Cogentin and denies abnormal muscle movements.Today Cogentin discontinued.  No other medication changes made today.  Patient agreeable to continue medications as prescribed.   1. Bipolar I disorder (HCC)  Continue- ARIPiprazole (ABILIFY) 15 MG tablet; Take 1 tablet (15 mg total) by mouth daily.  Dispense: 30 tablet; Refill: 3 Continue- buPROPion (WELLBUTRIN XL) 300 MG 24 hr tablet; Take 1 tablet (300 mg total) by mouth daily.  Dispense: 30 tablet; Refill: 3  2. Attention deficit hyperactivity disorder (ADHD), predominantly inattentive type  Continue- buPROPion (WELLBUTRIN XL) 300 MG 24 hr tablet; Take 1 tablet (300 mg total) by mouth daily.  Dispense: 30 tablet; Refill: 3    Follow up in 2.5 moths Follow up with therapy   Shanna Cisco, NP 01/21/2024, 2:17 PM

## 2024-01-26 ENCOUNTER — Encounter: Payer: Self-pay | Admitting: Plastic Surgery

## 2024-01-26 ENCOUNTER — Encounter: Payer: Self-pay | Admitting: Physician Assistant

## 2024-01-28 ENCOUNTER — Ambulatory Visit: Payer: MEDICAID | Admitting: Sports Medicine

## 2024-01-28 DIAGNOSIS — M47816 Spondylosis without myelopathy or radiculopathy, lumbar region: Secondary | ICD-10-CM

## 2024-01-28 DIAGNOSIS — Z7985 Long-term (current) use of injectable non-insulin antidiabetic drugs: Secondary | ICD-10-CM

## 2024-01-28 DIAGNOSIS — E1169 Type 2 diabetes mellitus with other specified complication: Secondary | ICD-10-CM

## 2024-01-28 DIAGNOSIS — R300 Dysuria: Secondary | ICD-10-CM | POA: Diagnosis not present

## 2024-01-28 LAB — POCT URINALYSIS DIP (CLINITEK)
Bilirubin, UA: NEGATIVE
Glucose, UA: 500 mg/dL — AB
Leukocytes, UA: NEGATIVE
Nitrite, UA: NEGATIVE
POC PROTEIN,UA: NEGATIVE
Spec Grav, UA: 1.02 (ref 1.010–1.025)
Urobilinogen, UA: 0.2 U/dL
pH, UA: 5.5 (ref 5.0–8.0)

## 2024-01-28 MED ORDER — NITROFURANTOIN MONOHYD MACRO 100 MG PO CAPS: 100.0000 mg | ORAL_CAPSULE | Freq: Two times a day (BID) | ORAL | 0 refills | Status: DC

## 2024-01-28 MED ORDER — OZEMPIC (0.25 OR 0.5 MG/DOSE) 2 MG/1.5ML ~~LOC~~ SOPN: PEN_INJECTOR | SUBCUTANEOUS | 1 refills | Status: DC

## 2024-01-28 NOTE — Progress Notes (Signed)
    Procedures performed today:    None.  Independent interpretation of notes and tests performed by another provider:   None.  Brief History, Exam, Impression, and Recommendations:    Lumbar spondylosis This is a very pleasant 48 year old female, she has known multilevel lumbar stenosis moderate L3-L4, she has had multiple in the past including epidurals and facet injections, MBB/RFA. Ultimately Lyrica  did provide some relief at 200 mg 2-3 times daily. As she is continuing to have symptomatology, this time left-sided axial back pain with radiation down the leg, anterolateral thigh but not past the knee we had decided at the last visit to do an epidural, she did have her breast reduction surgery and so far has been doing pretty good from a back perspective. I have asked her to restart physical therapy. The epidural order is in but I do not think she needs it at this juncture.  Diabetes mellitus (HCC) [name] was on the high dose Ozempic , she stopped it about a month ago due to her surgery. I am going to call in the low-dose Ozempic  so she can titrate back up. Further management per PCP.  Dysuria Several days increasing foul-smelling urine with frequency and urgency. Glucose and blood on urinalysis today. She is off of Ozempic  explaining the glycosuria. Also having a period today explaining the hematuria. We will culture the urine, I am also going to add Macrobid  to keep it covered.    ____________________________________________ Debby PARAS. Curtis, M.D., ABFM., CAQSM., AME. Primary Care and Sports Medicine [facility]  Adjunct Professor of [facility] Medicine  [employer]  Restaurant Manager, [employer]

## 2024-01-28 NOTE — Assessment & Plan Note (Signed)
 This is a very pleasant 48 year old female, she has known multilevel lumbar stenosis moderate L3-L4, she has had multiple in the past including epidurals and facet injections, MBB/RFA. Ultimately Lyrica  did provide some relief at 200 mg 2-3 times daily. As she is continuing to have symptomatology, this time left-sided axial back pain with radiation down the leg, anterolateral thigh but not past the knee we had decided at the last visit to do an epidural, she did have her breast reduction surgery and so far has been doing pretty good from a back perspective. I have asked her to restart physical therapy. The epidural order is in but I do not think she needs it at this juncture.

## 2024-01-28 NOTE — Assessment & Plan Note (Signed)
 Several days increasing foul-smelling urine with frequency and urgency. Glucose and blood on urinalysis today. She is off of Ozempic  explaining the glycosuria. Also having a period today explaining the hematuria. We will culture the urine, I am also going to add Macrobid  to keep it covered.

## 2024-01-28 NOTE — Assessment & Plan Note (Signed)
 [name] was on the high dose Ozempic , she stopped it about a month ago due to her surgery. I am going to call in the low-dose Ozempic  so she can titrate back up. Further management per PCP.

## 2024-02-01 LAB — URINE CULTURE

## 2024-02-03 ENCOUNTER — Encounter: Payer: Self-pay | Admitting: Physical Therapy

## 2024-02-03 ENCOUNTER — Ambulatory Visit: Payer: MEDICAID | Attending: Sports Medicine | Admitting: Physical Therapy

## 2024-02-03 DIAGNOSIS — M545 Low back pain, unspecified: Secondary | ICD-10-CM | POA: Insufficient documentation

## 2024-02-03 DIAGNOSIS — R2681 Unsteadiness on feet: Secondary | ICD-10-CM | POA: Diagnosis present

## 2024-02-03 DIAGNOSIS — R252 Cramp and spasm: Secondary | ICD-10-CM | POA: Insufficient documentation

## 2024-02-03 DIAGNOSIS — R293 Abnormal posture: Secondary | ICD-10-CM | POA: Insufficient documentation

## 2024-02-03 DIAGNOSIS — R2689 Other abnormalities of gait and mobility: Secondary | ICD-10-CM | POA: Diagnosis present

## 2024-02-03 DIAGNOSIS — M25551 Pain in right hip: Secondary | ICD-10-CM | POA: Insufficient documentation

## 2024-02-03 DIAGNOSIS — M5416 Radiculopathy, lumbar region: Secondary | ICD-10-CM | POA: Diagnosis present

## 2024-02-03 DIAGNOSIS — G8929 Other chronic pain: Secondary | ICD-10-CM | POA: Diagnosis present

## 2024-02-03 DIAGNOSIS — M6281 Muscle weakness (generalized): Secondary | ICD-10-CM | POA: Diagnosis present

## 2024-02-03 DIAGNOSIS — M0600 Rheumatoid arthritis without rheumatoid factor, unspecified site: Principal | ICD-10-CM

## 2024-02-03 MED ORDER — DICLOFENAC SODIUM 75 MG TABLET,DELAYED RELEASE
ORAL_TABLET | Freq: Two times a day (BID) | ORAL | 3 refills | 0.00 days
Start: 2024-02-03 — End: ?

## 2024-02-03 NOTE — Therapy (Signed)
 OUTPATIENT PHYSICAL THERAPY THORACOLUMBAR TREATMENT   Patient Name: Frances [name] MRN: [id number] DOB:January 20, 1976, 48 y.o., female Today's Date: 02/03/2024  END OF SESSION:  PT End of Session - 02/03/24 1224     Visit Number 5    Number of Visits 12    Date for PT Re-Evaluation 02/23/24    Authorization Type [employer]    PT Start Time 1145    PT Stop Time 1225    PT Time Calculation (min) 40 min    Activity Tolerance Patient tolerated treatment well    Behavior During Therapy WFL for tasks assessed/performed             Past Medical History:  Diagnosis Date   Anxiety    Arthritis    Back pain, chronic    Bipolar 1 disorder (HCC)    Chronic fatigue syndrome    DDD (degenerative disc disease), lumbar    Depression    Diabetes mellitus without complication (HCC)    Fibromyalgia    IUD 2012   Mirena   MTHFR gene mutation    Rheumatoid arteritis (HCC)    Past Surgical History:  Procedure Laterality Date   BREAST REDUCTION SURGERY Bilateral 01/07/2024   Procedure: Bilateral breast reduction with liposuction;  Surgeon: Peggye Form, DO;  Location: [facility];  Service: Plastics;  Laterality: Bilateral;   COLONOSCOPY WITH PROPOFOL N/A 02/25/2017   Procedure: COLONOSCOPY WITH PROPOFOL;  Surgeon: Willis Modena, MD;  Location: [facility];  Service: Endoscopy;  Laterality: N/A;   left knee lateral release  1993   scar tisue removal  03/2005   left ankle   TONSILLECTOMY  age 23's   Patient Active Problem List   Diagnosis Date Noted   Dysuria 01/28/2024   Pain in left third proximal interphalangeal joint 10/16/2023   Personal history UTI 09/14/2023   Rheumatoid arthritis 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 (HCC) 09/14/2023   Urinary hesitancy 06/30/2023   Urinary frequency 06/16/2023   OAB (overactive bladder) 06/16/2023   Chronic pain syndrome 06/10/2023   Crush injury of right foot 05/25/2023   Neuropraxia of right  thumb 10/08/2022   Microalbuminuria 09/09/2022   Night sweats 06/18/2022   Proteinuria 03/17/2022   Chronic right hip pain 01/06/2022   Acute pain of right shoulder 01/06/2022   ETD (Eustachian tube dysfunction), bilateral 08/23/2021   SOB (shortness of breath) on exertion 08/20/2021   Overweight (BMI 25.0-29.9) 06/18/2021   Attention deficit hyperactivity disorder (ADHD), 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   Hyperlipidemia LDL goal <70 03/18/2021   Diabetes mellitus (HCC) 03/13/2021   Seasonal allergies 12/25/2020   Bilateral leg edema 11/19/2020   Symptomatic mammary hypertrophy 09/04/2020   Recurrent major depressive disorder, in partial remission (HCC) 05/24/2020   Generalized anxiety disorder 05/24/2020   DDD (degenerative disc disease), cervical 03/13/2020   Ingrown right big toenail 03/13/2020   Labral tear of right hip joint, degenerative 01/11/2020   Grief reaction 11/28/2019   Elevated fasting glucose 04/08/2018   Borderline personality disorder (HCC) 01/22/2018   Seronegative rheumatoid arthritis (HCC) 12/09/2017   New daily persistent headache 09/26/2017   MTHFR gene mutation 09/20/2017   Bipolar I disorder (HCC) 09/16/2017   Chronic fatigue syndrome 09/16/2017   Back pain 10/13/2008   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   Lumbar spondylosis 02/10/2008   FIBROMYALGIA 02/10/2008   Other specified abnormal findings of blood chemistry 02/10/2008    PCP: Jomarie Longs, PA-C   REFERRING PROVIDER: Jomarie Longs, PA-C  REFERRING DIAG: M47.816 (ICD-10-CM) - Lumbar spondylosis   Rationale for Evaluation and Treatment: Rehabilitation  THERAPY DIAG:  Radiculopathy, lumbar region  Cramp and spasm  ONSET DATE: 2 weeks ago I think  SUBJECTIVE:                                                                                                                                                                                           SUBJECTIVE  STATEMENT: Patient returns after breast reduction surgery 4 weeks ago. She states her back feels better but she thinks that is because she has been resting more due to surgery. She saw MD who wants her to continue PT to improve core strength  PERTINENT HISTORY:  L3-4 lumbar spondylosis, DM, anxiety/depression, BPD, RA  PAIN:  Are you having pain? Yes: NPRS scale: 1-2/10 Pain location: Left low back around hip to ant thigh to knee Pain description: tingles, burns, numbness  on the left side and just to knee Aggravating factors: walking sitting in hard chairs Relieving factors: bending, lying down  PRECAUTIONS: None  RED FLAGS: None   WEIGHT BEARING RESTRICTIONS: No  FALLS:  Has patient fallen in last 6 months? Yes. Number of falls 1  LIVING ENVIRONMENT: Lives with: lives alone Lives in: House/apartment Stairs: Yes: External: 2 steps; none Has following equipment at home: None  OCCUPATION: unemployed  PLOF: Independent  PATIENT GOALS: reduce pain  NEXT MD VISIT: 01/28/24  OBJECTIVE:  Note: Objective measures were completed at Evaluation unless otherwise noted.  DIAGNOSTIC FINDINGS:  None recent  PATIENT SURVEYS:  Modified Oswestry 20 / 50 = 40.0 %   COGNITION: Overall cognitive status: Within functional limits for tasks assessed     SENSATION: WFL  MUSCLE LENGTH: WFL   POSTURE: rounded shoulders and forward head  PALPATION: L gluteals and lumbar  LUMBAR ROM: *pain  AROM eval 02/03/24  Flexion full full  Extension 75% * left sided 75* pain right  Right lateral flexion Hand to mid thigh pain R Hand to mid thigh  Left lateral flexion Hand to greater trochanter Hand to mid thigh  Right rotation Full full  Left rotation 75%; passive full full   (Blank rows = not tested)  LOWER EXTREMITY ROM:   WFL  LOWER EXTREMITY MMT:  Grossly 5/5 except where noted  MMT Right eval Left eval Left 02/03/24  Hip flexion 5 3+* in back 4  Hip extension 5 4+   Hip  abduction 5 4+   Hip adduction 5 4+   Hip internal rotation     Hip external rotation     Knee flexion 5 4+   Knee  extension 5 5   Ankle dorsiflexion 5 5   Ankle plantarflexion     Ankle inversion     Ankle eversion      (Blank rows = not tested)  LUMBAR SPECIAL TESTS:  Long sit test: Negative  FUNCTIONAL TESTS:  5 times sit to stand: 15.9 sec Dynamic Gait Index: 24/24  12/31/23 12/31/23 MCTSIB 120/120 BERG 55/56 SLS R 9 sec, L 30 sec   [facility] Adult PT Treatment:                                                DATE: 02/03/24 Therapeutic Exercise/Activity: ROM and strength measurements Bridge 5 x 10 sec hold --> bridge with yoga block squeeze 5 x 10 sec Supine march with core contraction x 10 Supine march up up down down x 10 - cues for breathing Supine oblique isometric 5 x 5 sec hold bilat Sidelying hip abd Sidelying hip circles CW/CCW 2 x 10 Sit <> stand with red TB resistance at UEs 2 x 10 Quadruped alt hip ext x 10 bilat   [facility] Adult PT Treatment:                                           DATE: 01/06/2024 Therapeutic Exercise: Quadruped rock back to heels (lumbar flexion) + static cups on bilateral lumbar paraspinals/upper glutes Seated green PB roll out front x10 LTR  Bridges + yoga block b/w knees Side Lying: Open books --> hand behind head Straight leg hip abd Hip ext in abd (small ROM) Manual Therapy: Cupping treatment:  Dynamic/gliding lumbar paraspinals Static - lumbar paraspinals & upper glutes Shearing technique with lumbar flexion   [facility] Adult PT Treatment:                                            DATE: 01/04/2024 Therapeutic Exercise: Lateral cat in neutral spine --> hip bump L, looking to R LTR  L figure 4 LTR Hooklying marching Supine sciatic nerve glide 90/90 with feet on wall, big towel roll b/w knees: Adductor activation w/o glute gripping Bridges Diaphragmatic breathing  SKTC  Modified single leg stretch --> modified double leg stretch  (pain on L LB) R S/L open books (hand behind head) S/L 90/90 --> illiacus pullbacks (feet on wall, yoga block b/w knees) Standing lateral pelvic glide to column Self-massage with 4" ball L Lumbar     PATIENT EDUCATION:  Education details: Diaphragmatic breathing (HEP) Person educated: Patient Education method: Explanation, Demonstration, and Handouts Education comprehension: verbalized understanding and returned demonstration  HOME EXERCISE PROGRAM: Access Code: [email or web address] URL: [email or web address] Date: 01/06/2024 Prepared by: Carlynn Herald  Exercises - Single Leg Bridge  - 1 x daily - 3-4 x weekly - 1 sets - 10 reps - Supine March  - 1 x daily - 7 x weekly - 2 sets - 10 reps - Hooklying Sequential Leg March and Lower  - 1 x daily - 3-4 x weekly - 1 sets - 10 reps - Supine Lower Trunk Rotation  - 2 x daily - 7 x weekly - 1 sets - 5 reps - 10 sec hold - Diaphragmatic  Breathing at 90/90 Supported  - 1 x daily - 7 x weekly - 3 sets - 10 reps  Breast Reduction - post surgical exercises and h/o (see pt instructions)  Access Code: [email or web address] URL: [email or web address] Date: 12/31/2023 Prepared by: Raynelle Fanning  Program Notes Here are the suggested stretches for after breast reduction surgery. Remember to avoid significant pain.   Exercises - Correct Seated Posture  - Seated Diaphragmatic Breathing  - 3 x daily - 7 x weekly - 10 reps - Shoulder External Rotation and Scapular Retraction  - 3 x daily - 7 x weekly - 5 reps - 3 seconds hold - Seated Chest Stretch with Hands Behind Head  - 3 x daily - 7 x weekly - 3 reps - 5 second hold - Standing Shoulder Flexion Wall Walk  - 3 x daily - 7 x weekly - 3 reps - 3 seconds hold - Standing Shoulder Abduction Finger Walk at Wall  - 3 x daily - 7 x weekly - 3 reps - 3 seconds hold  ASSESSMENT:  CLINICAL IMPRESSION:  Pt with decreased pain and improved mobility after resting a lot after breast reduction surgery.  Treatment recommenced with focus on core strength as pt hopes to avoid more medical intervention  Eval: Patient is a 48 y.o. female who was seen today for physical therapy evaluation and treatment for left lumbar radiculopathy secondary to lumbar spondylosis. She also reports balance problems and has had one fall. She has deficits in lumbar ROM and strength and has pain limiting sitting and walking. Plan to assess balance fully at next visit. No significant balance deficits were evident today during evaluation. She will benefit from skilled PT to address these deficits.  She is scheduled for a lumbar injection in 2 days and breast reduction surgery in a week, so will miss some time due to restrictions following this surgery.     GOALS: Goals reviewed with patient? Yes  SHORT TERM GOALS: Target date: 01/12/2024   Patient will be independent with initial HEP.  Baseline:  Goal status: INITIAL  2.  Patient will report centralization of radicular symptoms.  Baseline: no change Goal status: IN PROGRESS  LONG TERM GOALS: Target date: 02/23/2024   Patient will be independent with advanced/ongoing HEP to improve outcomes and carryover.  Baseline: initial HEP Goal status: INITIAL  2.  Patient will report 75% improvement in low back pain to improve QOL.  Baseline: 4-7/10 Goal status: INITIAL  3.  Patient will demonstrate functional pain free lumbar ROM to perform ADLs.   Baseline: see objective chart Goal status: MET 02/03/24  4.  Patient will demonstrate improved strength to 5/5 to normalize ADLs. Baseline: see objective chart Goal status: IN PROGRESS  5.  Patient will report 14 on modified oswestry to demonstrate improved functional ability.  Baseline: 20 / 50 = 40.0 % Goal status: INITIAL  6.  Patient will demonstrate at least 19/24 on DGI to improve gait stability and reduce risk for falls.  Baseline: TBD Goal status: INITIAL    PLAN:  PT FREQUENCY: 2x/week  PT DURATION: 8  weeks  PLANNED INTERVENTIONS: 97164- PT Re-evaluation, 97110-Therapeutic exercises, 97530- Therapeutic activity, [id number]- Neuromuscular re-education, 97535- Self Care, [id number]- Manual therapy, [id number]- Gait training, [phone]- Aquatic Therapy, 97014- Electrical stimulation (unattended), [phone]- Traction (mechanical), Patient/Family education, Balance training, Taping, Dry Needling, Joint mobilization, Spinal mobilization, Cryotherapy, and Moist heat.  PLAN FOR NEXT SESSION: update HEP core strength   Reggy Eye, PT,DPT02/11/2511:25 PM

## 2024-02-04 MED ORDER — DICLOFENAC SODIUM 75 MG TABLET,DELAYED RELEASE
ORAL_TABLET | Freq: Two times a day (BID) | ORAL | 3 refills | 90.00 days | Status: CP
Start: 2024-02-04 — End: ?

## 2024-02-09 ENCOUNTER — Encounter: Payer: Self-pay | Admitting: Physical Therapy

## 2024-02-09 ENCOUNTER — Ambulatory Visit: Payer: MEDICAID | Admitting: Physical Therapy

## 2024-02-09 DIAGNOSIS — M6281 Muscle weakness (generalized): Secondary | ICD-10-CM

## 2024-02-09 DIAGNOSIS — M5416 Radiculopathy, lumbar region: Secondary | ICD-10-CM

## 2024-02-09 NOTE — Therapy (Signed)
 OUTPATIENT PHYSICAL THERAPY THORACOLUMBAR TREATMENT   Patient Name: Frances [name] MRN: [id number] DOB:[birth year], 48 y.o., female Today's Date: 02/09/2024  END OF SESSION:  PT End of Session - 02/09/24 1143     Visit Number 6    Number of Visits 12    Date for PT Re-Evaluation 02/23/24    Authorization Type [employer]    PT Start Time 1100    PT Stop Time 1143    PT Time Calculation (min) 43 min    Activity Tolerance Patient tolerated treatment well    Behavior During Therapy WFL for tasks assessed/performed              Past Medical History:  Diagnosis Date   Anxiety    Arthritis    Back pain, chronic    Bipolar 1 disorder (HCC)    Chronic fatigue syndrome    DDD (degenerative disc disease), lumbar    Depression    Diabetes mellitus without complication (HCC)    Fibromyalgia    IUD 2012   Mirena   MTHFR gene mutation    Rheumatoid arteritis (HCC)    Past Surgical History:  Procedure Laterality Date   BREAST REDUCTION SURGERY Bilateral 01/07/2024   Procedure: Bilateral breast reduction with liposuction;  Surgeon: Peggye Form, DO;  Location: [facility];  Service: Plastics;  Laterality: Bilateral;   COLONOSCOPY WITH PROPOFOL N/A 02/25/2017   Procedure: COLONOSCOPY WITH PROPOFOL;  Surgeon: Willis Modena, MD;  Location: [facility];  Service: Endoscopy;  Laterality: N/A;   left knee lateral release  1993   scar tisue removal  03/2005   left ankle   TONSILLECTOMY  age 66's   Patient Active Problem List   Diagnosis Date Noted   Dysuria 01/28/2024   Pain in left third proximal interphalangeal joint 10/16/2023   Personal history UTI 09/14/2023   Rheumatoid arthritis 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 (HCC) 09/14/2023   Urinary hesitancy 06/30/2023   Urinary frequency 06/16/2023   OAB (overactive bladder) 06/16/2023   Chronic pain syndrome 06/10/2023   Crush injury of right foot 05/25/2023   Neuropraxia of right  thumb 10/08/2022   Microalbuminuria 09/09/2022   Night sweats 06/18/2022   Proteinuria 03/17/2022   Chronic right hip pain 01/06/2022   Acute pain of right shoulder 01/06/2022   ETD (Eustachian tube dysfunction), bilateral 08/23/2021   SOB (shortness of breath) on exertion 08/20/2021   Overweight (BMI 25.0-29.9) 06/18/2021   Attention deficit hyperactivity disorder (ADHD), 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   Hyperlipidemia LDL goal <70 03/18/2021   Diabetes mellitus (HCC) 03/13/2021   Seasonal allergies 12/25/2020   Bilateral leg edema 11/19/2020   Symptomatic mammary hypertrophy 09/04/2020   Recurrent major depressive disorder, in partial remission (HCC) 05/24/2020   Generalized anxiety disorder 05/24/2020   DDD (degenerative disc disease), cervical 03/13/2020   Ingrown right big toenail 03/13/2020   Labral tear of right hip joint, degenerative 01/11/2020   Grief reaction 11/28/2019   Elevated fasting glucose 04/08/2018   Borderline personality disorder (HCC) 01/22/2018   Seronegative rheumatoid arthritis (HCC) 12/09/2017   New daily persistent headache 09/26/2017   MTHFR gene mutation 09/20/2017   Bipolar I disorder (HCC) 09/16/2017   Chronic fatigue syndrome 09/16/2017   Back pain 10/13/2008   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   Lumbar spondylosis 02/10/2008   FIBROMYALGIA 02/10/2008   Other specified abnormal findings of blood chemistry 02/10/2008    PCP: Jomarie Longs, PA-C   REFERRING PROVIDER: Jomarie Longs,  PA-C   REFERRING DIAG: M47.816 (ICD-10-CM) - Lumbar spondylosis   Rationale for Evaluation and Treatment: Rehabilitation  THERAPY DIAG:  Radiculopathy, lumbar region  Muscle weakness (generalized)  ONSET DATE: 2 weeks ago I think  SUBJECTIVE:                                                                                                                                                                                           SUBJECTIVE  STATEMENT: Pt states her side felt better after she left here last week. She has some back pain but not too bad  PERTINENT HISTORY:  L3-4 lumbar spondylosis, DM, anxiety/depression, BPD, RA  PAIN:  Are you having pain? Yes: NPRS scale: 4/10 Pain location: Left low back around hip to ant thigh to knee Pain description: tingles, burns, numbness  on the left side and just to knee Aggravating factors: walking sitting in hard chairs Relieving factors: bending, lying down  PRECAUTIONS: None  RED FLAGS: None   WEIGHT BEARING RESTRICTIONS: No  FALLS:  Has patient fallen in last 6 months? Yes. Number of falls 1  LIVING ENVIRONMENT: Lives with: lives alone Lives in: House/apartment Stairs: Yes: External: 2 steps; none Has following equipment at home: None  OCCUPATION: unemployed  PLOF: Independent  PATIENT GOALS: reduce pain  NEXT MD VISIT: 01/28/24  OBJECTIVE:  Note: Objective measures were completed at Evaluation unless otherwise noted.  DIAGNOSTIC FINDINGS:  None recent  PATIENT SURVEYS:  Modified Oswestry 20 / 50 = 40.0 %   COGNITION: Overall cognitive status: Within functional limits for tasks assessed     SENSATION: WFL  MUSCLE LENGTH: WFL   POSTURE: rounded shoulders and forward head  PALPATION: L gluteals and lumbar  LUMBAR ROM: *pain  AROM eval 02/03/24  Flexion full full  Extension 75% * left sided 75* pain right  Right lateral flexion Hand to mid thigh pain R Hand to mid thigh  Left lateral flexion Hand to greater trochanter Hand to mid thigh  Right rotation Full full  Left rotation 75%; passive full full   (Blank rows = not tested)  LOWER EXTREMITY ROM:   WFL  LOWER EXTREMITY MMT:  Grossly 5/5 except where noted  MMT Right eval Left eval Left 02/03/24  Hip flexion 5 3+* in back 4  Hip extension 5 4+   Hip abduction 5 4+   Hip adduction 5 4+   Hip internal rotation     Hip external rotation     Knee flexion 5 4+   Knee extension 5 5    Ankle dorsiflexion 5 5   Ankle plantarflexion     Ankle  inversion     Ankle eversion      (Blank rows = not tested)  LUMBAR SPECIAL TESTS:  Long sit test: Negative  FUNCTIONAL TESTS:  5 times sit to stand: 15.9 sec Dynamic Gait Index: 24/24  12/31/23 12/31/23 MCTSIB 120/120 BERG 55/56 SLS R 9 sec, L 30 sec   [facility] Adult PT Treatment:                                                DATE: 02/09/24 Therapeutic Exercise/Activity: Nustep L6 x 5 min UEs and LEs for warm up Sidelying hip abd Sidelying hip circles CW/CCW 2 x 10 Supine march up up down down x 10 - cues for breathing Supine oblique isometric 5 x 5 sec hold bilat Sit <> stand with red TB resistance at UEs 2 x 10 Quadruped alt hip ext x 10 bilat Shoulder ext green TB x 10 Green TB pull down with march x 10 Resisted walking green TB fwd x 10, bkwd x 10 Palloff press green TB 2 x 10 bilat Physioball roll out all directions   [facility] Adult PT Treatment:                                                DATE: 02/03/24 Therapeutic Exercise/Activity: ROM and strength measurements Bridge 5 x 10 sec hold --> bridge with yoga block squeeze 5 x 10 sec Supine march with core contraction x 10 Supine march up up down down x 10 - cues for breathing Supine oblique isometric 5 x 5 sec hold bilat Sidelying hip abd Sidelying hip circles CW/CCW 2 x 10 Sit <> stand with red TB resistance at UEs 2 x 10 Quadruped alt hip ext x 10 bilat   [facility] Adult PT Treatment:                                           DATE: 01/06/2024 Therapeutic Exercise: Quadruped rock back to heels (lumbar flexion) + static cups on bilateral lumbar paraspinals/upper glutes Seated green PB roll out front x10 LTR  Bridges + yoga block b/w knees Side Lying: Open books --> hand behind head Straight leg hip abd Hip ext in abd (small ROM) Manual Therapy: Cupping treatment:  Dynamic/gliding lumbar paraspinals Static - lumbar paraspinals & upper glutes Shearing technique  with lumbar flexion    PATIENT EDUCATION:  Education details: Diaphragmatic breathing (HEP) Person educated: Patient Education method: Explanation, Demonstration, and Handouts Education comprehension: verbalized understanding and returned demonstration  HOME EXERCISE PROGRAM: Access Code: [email or web address] URL: [email or web address] Date: 02/09/2024 Prepared by: Reggy Eye  Exercises - Single Leg Bridge  - 1 x daily - 3-4 x weekly - 1 sets - 10 reps - Hooklying Sequential Leg March and Lower  - 1 x daily - 3-4 x weekly - 1 sets - 10 reps - Supine Lower Trunk Rotation  - 2 x daily - 7 x weekly - 1 sets - 5 reps - 10 sec hold - Diaphragmatic Breathing at 90/90 Supported  - 1 x daily - 7 x weekly - 3 sets - 10 reps - Sidelying Hip Abduction  -  1 x daily - 7 x weekly - 2 sets - 10 reps - Sidelying Hip Circles  - 1 x daily - 7 x weekly - 3 sets - 10 reps - Resistance Pulldown with March  - 1 x daily - 7 x weekly - 3 sets - 10 reps  Breast Reduction - post surgical exercises and h/o (see pt instructions)  Access Code: [email or web address] URL: [email or web address] Date: 12/31/2023 Prepared by: Raynelle Fanning  Program Notes Here are the suggested stretches for after breast reduction surgery. Remember to avoid significant pain.   Exercises - Correct Seated Posture  - Seated Diaphragmatic Breathing  - 3 x daily - 7 x weekly - 10 reps - Shoulder External Rotation and Scapular Retraction  - 3 x daily - 7 x weekly - 5 reps - 3 seconds hold - Seated Chest Stretch with Hands Behind Head  - 3 x daily - 7 x weekly - 3 reps - 5 second hold - Standing Shoulder Flexion Wall Walk  - 3 x daily - 7 x weekly - 3 reps - 3 seconds hold - Standing Shoulder Abduction Finger Walk at Wall  - 3 x daily - 7 x weekly - 3 reps - 3 seconds hold  ASSESSMENT:  CLINICAL IMPRESSION:  Pt with good tolerance to progression of core strengthening exercises. HEP updated to progress core strength and  stability. Pt returns to MD tomorrow to see if lifting restrictions can be discontinued. She will benefit from more strength training to prepare to ride and care for her horses again.  Eval: Patient is a 48 y.o. female who was seen today for physical therapy evaluation and treatment for left lumbar radiculopathy secondary to lumbar spondylosis. She also reports balance problems and has had one fall. She has deficits in lumbar ROM and strength and has pain limiting sitting and walking. Plan to assess balance fully at next visit. No significant balance deficits were evident today during evaluation. She will benefit from skilled PT to address these deficits.  She is scheduled for a lumbar injection in 2 days and breast reduction surgery in a week, so will miss some time due to restrictions following this surgery.     GOALS: Goals reviewed with patient? Yes  SHORT TERM GOALS: Target date: 01/12/2024   Patient will be independent with initial HEP.  Baseline:  Goal status: INITIAL  2.  Patient will report centralization of radicular symptoms.  Baseline: no change Goal status: IN PROGRESS  LONG TERM GOALS: Target date: 02/23/2024   Patient will be independent with advanced/ongoing HEP to improve outcomes and carryover.  Baseline: initial HEP Goal status: INITIAL  2.  Patient will report 75% improvement in low back pain to improve QOL.  Baseline: 4-7/10 Goal status: INITIAL  3.  Patient will demonstrate functional pain free lumbar ROM to perform ADLs.   Baseline: see objective chart Goal status: MET 02/03/24  4.  Patient will demonstrate improved strength to 5/5 to normalize ADLs. Baseline: see objective chart Goal status: IN PROGRESS  5.  Patient will report 14 on modified oswestry to demonstrate improved functional ability.  Baseline: 20 / 50 = 40.0 % Goal status: INITIAL  6.  Patient will demonstrate at least 19/24 on DGI to improve gait stability and reduce risk for falls.   Baseline: TBD Goal status: INITIAL    PLAN:  PT FREQUENCY: 2x/week  PT DURATION: 8 weeks  PLANNED INTERVENTIONS: 97164- PT Re-evaluation, 97110-Therapeutic exercises, 97530- Therapeutic activity, [id number]- Neuromuscular re-education, 97535- Self Care, 24401-  Manual therapy, [id number]- Gait training, 78295- Aquatic Therapy, [phone]- Electrical stimulation (unattended), [phone]- Traction (mechanical), Patient/Family education, Balance training, Taping, Dry Needling, Joint mobilization, Spinal mobilization, Cryotherapy, and Moist heat.  PLAN FOR NEXT SESSION: core strength, lifting as allowed by MD   Reggy Eye, PT,DPT02/18/2511:44 AM

## 2024-02-10 ENCOUNTER — Encounter: Payer: Self-pay | Admitting: Physician Assistant

## 2024-02-10 ENCOUNTER — Ambulatory Visit: Payer: MEDICAID | Admitting: Physician Assistant

## 2024-02-10 VITALS — BP 133/86 | HR 124

## 2024-02-10 DIAGNOSIS — Z9889 Other specified postprocedural states: Secondary | ICD-10-CM

## 2024-02-10 NOTE — Progress Notes (Signed)
 Patient is a pleasant 48 year old female s/p bilateral breast reduction with liposuction performed 01/07/2024 by Dr. Ulice Bold who presents to clinic for postoperative follow-up.  Reviewed operative report and nearly 1100 g removed from the right breast and nearly 1500 g removed from the left breast.  She was seen in clinic on 01/19/2024 Dr. Ulice Bold for postoperative follow-up.  At that time, there was some swelling and bruising as expected, but no evidence of seroma or hematoma and her exam was entirely benign.    Today, patient is doing well.  She states that her left lateral breast/left axillary region hurts sometimes, but she is overall doing well and without complaints.  She recently started PT which has helped with her slight discomfort.  She states that it was pretty bruised after surgery and does admit that it has improved with time.  Her heart rate was elevated on arrival today, but she cites anxiety about the visit and denies any lightheadedness, difficulty breathing, or other symptoms.  On exam, breasts have excellent shape and symmetry.  Steri-Strips are all removed without complication or difficulty.  Incisions CDI throughout.  Breasts are soft throughout.  The left lateral breast where she described area of discomfort is without considerable TTP or discrete areas of firmness.  Old ecchymoses, but no hematoma.  Recommend continued activity modifications and compressive garments.  Follow-up in 2 weeks for likely final postoperative encounter.  Will discuss scar gels at that time.  Recommending Vaseline or Aquaphor to the incisions throughout to help with residual Dermabond in the interim.  Informed patient that it is not uncommon to have discomfort in the areas where liposuction was performed and it sounds as though it is improving incrementally with time as expected.  Exam today is benign.  Picture(s) obtained of the patient and placed in the chart were with the patient's or guardian's  permission.

## 2024-02-16 ENCOUNTER — Ambulatory Visit: Payer: MEDICAID | Admitting: Physical Therapy

## 2024-02-16 ENCOUNTER — Encounter: Payer: Self-pay | Admitting: Physical Therapy

## 2024-02-16 ENCOUNTER — Other Ambulatory Visit: Payer: Self-pay | Admitting: Physician Assistant

## 2024-02-16 DIAGNOSIS — M6281 Muscle weakness (generalized): Secondary | ICD-10-CM

## 2024-02-16 DIAGNOSIS — M5416 Radiculopathy, lumbar region: Secondary | ICD-10-CM | POA: Diagnosis not present

## 2024-02-16 DIAGNOSIS — M0600 Rheumatoid arthritis without rheumatoid factor, unspecified site: Principal | ICD-10-CM

## 2024-02-16 MED ORDER — HYDROXYCHLOROQUINE 200 MG TABLET
ORAL_TABLET | Freq: Two times a day (BID) | ORAL | 3 refills | 90.00 days
Start: 2024-02-16 — End: ?

## 2024-02-16 NOTE — Therapy (Signed)
 OUTPATIENT PHYSICAL THERAPY THORACOLUMBAR TREATMENT   Patient Name: Frances [name] MRN: [id number] DOB:06-25-1976, 48 y.o., female Today's Date: 02/16/2024  END OF SESSION:  PT End of Session - 02/16/24 1444     Visit Number 7    Number of Visits 12    Date for PT Re-Evaluation 02/23/24    Authorization Type [employer]    PT Start Time 1400    PT Stop Time 1443    PT Time Calculation (min) 43 min    Activity Tolerance Patient tolerated treatment well    Behavior During Therapy WFL for tasks assessed/performed               Past Medical History:  Diagnosis Date   Anxiety    Arthritis    Back pain, chronic    Bipolar 1 disorder (HCC)    Chronic fatigue syndrome    DDD (degenerative disc disease), lumbar    Depression    Diabetes mellitus without complication (HCC)    Fibromyalgia    IUD 2012   Mirena   MTHFR gene mutation    Rheumatoid arteritis (HCC)    Past Surgical History:  Procedure Laterality Date   BREAST REDUCTION SURGERY Bilateral 01/07/2024   Procedure: Bilateral breast reduction with liposuction;  Surgeon: Peggye Form, DO;  Location: [facility];  Service: Plastics;  Laterality: Bilateral;   COLONOSCOPY WITH PROPOFOL N/A 02/25/2017   Procedure: COLONOSCOPY WITH PROPOFOL;  Surgeon: Willis Modena, MD;  Location: [facility];  Service: Endoscopy;  Laterality: N/A;   left knee lateral release  1993   scar tisue removal  03/2005   left ankle   TONSILLECTOMY  age 3's   Patient Active Problem List   Diagnosis Date Noted   Dysuria 01/28/2024   Pain in left third proximal interphalangeal joint 10/16/2023   Personal history UTI 09/14/2023   Rheumatoid arthritis 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 (HCC) 09/14/2023   Urinary hesitancy 06/30/2023   Urinary frequency 06/16/2023   OAB (overactive bladder) 06/16/2023   Chronic pain syndrome 06/10/2023   Crush injury of right foot 05/25/2023   Neuropraxia of  right thumb 10/08/2022   Microalbuminuria 09/09/2022   Night sweats 06/18/2022   Proteinuria 03/17/2022   Chronic right hip pain 01/06/2022   Acute pain of right shoulder 01/06/2022   ETD (Eustachian tube dysfunction), bilateral 08/23/2021   SOB (shortness of breath) on exertion 08/20/2021   Overweight (BMI 25.0-29.9) 06/18/2021   Attention deficit hyperactivity disorder (ADHD), 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   Hyperlipidemia LDL goal <70 03/18/2021   Diabetes mellitus (HCC) 03/13/2021   Seasonal allergies 12/25/2020   Bilateral leg edema 11/19/2020   Symptomatic mammary hypertrophy 09/04/2020   Recurrent major depressive disorder, in partial remission (HCC) 05/24/2020   Generalized anxiety disorder 05/24/2020   DDD (degenerative disc disease), cervical 03/13/2020   Ingrown right big toenail 03/13/2020   Labral tear of right hip joint, degenerative 01/11/2020   Grief reaction 11/28/2019   Elevated fasting glucose 04/08/2018   Borderline personality disorder (HCC) 01/22/2018   Seronegative rheumatoid arthritis (HCC) 12/09/2017   New daily persistent headache 09/26/2017   MTHFR gene mutation 09/20/2017   Bipolar I disorder (HCC) 09/16/2017   Chronic fatigue syndrome 09/16/2017   Back pain 10/13/2008   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   Lumbar spondylosis 02/10/2008   FIBROMYALGIA 02/10/2008   Other specified abnormal findings of blood chemistry 02/10/2008    PCP: Jomarie Longs, PA-C   REFERRING PROVIDER: Tandy Gaw  L, PA-C   REFERRING DIAG: M47.816 (ICD-10-CM) - Lumbar spondylosis   Rationale for Evaluation and Treatment: Rehabilitation  THERAPY DIAG:  Radiculopathy, lumbar region  Muscle weakness (generalized)  ONSET DATE: 2 weeks ago I think  SUBJECTIVE:                                                                                                                                                                                            SUBJECTIVE STATEMENT: Pt returned to breast reduction surgeon who removed steri strips and gave pt permission to lift up to 15#. She states she was sore after that visit but that she is feeling better from that. She states her lower back is bothering her when she is up moving around, it feels better with rest.  PERTINENT HISTORY:  L3-4 lumbar spondylosis, DM, anxiety/depression, BPD, RA  PAIN:  Are you having pain? Yes: NPRS scale: 4/10 Pain location: Left low back around hip to ant thigh to knee Pain description: tingles, burns, numbness  on the left side and just to knee Aggravating factors: walking sitting in hard chairs Relieving factors: bending, lying down  PRECAUTIONS: None  RED FLAGS: None   WEIGHT BEARING RESTRICTIONS: No  FALLS:  Has patient fallen in last 6 months? Yes. Number of falls 1  LIVING ENVIRONMENT: Lives with: lives alone Lives in: House/apartment Stairs: Yes: External: 2 steps; none Has following equipment at home: None  OCCUPATION: unemployed  PLOF: Independent  PATIENT GOALS: reduce pain  NEXT MD VISIT: 01/28/24  OBJECTIVE:  Note: Objective measures were completed at Evaluation unless otherwise noted.  DIAGNOSTIC FINDINGS:  None recent  PATIENT SURVEYS:  Modified Oswestry 20 / 50 = 40.0 %   POSTURE: rounded shoulders and forward head  PALPATION: L gluteals and lumbar  LUMBAR ROM: *pain  AROM eval 02/03/24  Flexion full full  Extension 75% * left sided 75* pain right  Right lateral flexion Hand to mid thigh pain R Hand to mid thigh  Left lateral flexion Hand to greater trochanter Hand to mid thigh  Right rotation Full full  Left rotation 75%; passive full full   (Blank rows = not tested)  LOWER EXTREMITY ROM:   WFL  LOWER EXTREMITY MMT:  Grossly 5/5 except where noted  MMT Right eval Left eval Left 02/03/24  Hip flexion 5 3+* in back 4  Hip extension 5 4+   Hip abduction 5 4+   Hip adduction 5 4+   Hip internal rotation      Hip external rotation     Knee flexion 5 4+   Knee extension 5 5   Ankle  dorsiflexion 5 5   Ankle plantarflexion     Ankle inversion     Ankle eversion      (Blank rows = not tested)  LUMBAR SPECIAL TESTS:  Long sit test: Negative  FUNCTIONAL TESTS:  5 times sit to stand: 15.9 sec Dynamic Gait Index: 24/24  12/31/23 12/31/23 MCTSIB 120/120 BERG 55/56 SLS R 9 sec, L 30 sec   [facility] Adult PT Treatment:                                                DATE: 02/16/24 Therapeutic Exercise/Activity: Nustep L6 x 5 min UEs and LEs for warm up row green TB x 20 Green TB pull down with march x 15 Pallof green TB 2 x 10 Sit <> stand 10# KB x 10  --> sit <> stand with 10# KB unilateral hold x 10 bilat Resisted walking 10# fwd/bkwd x 10 Resisted walking laterally 5# x 10 bilat Sidelying hip abd x 10 bilat Sidelying hip circles 10 each CW/CCW Seated ab isometric with physioball x 10 Seated oblique isometric with physioball x 10 bilat   [facility] Adult PT Treatment:                                                DATE: 02/09/24 Therapeutic Exercise/Activity: Nustep L6 x 5 min UEs and LEs for warm up Sidelying hip abd Sidelying hip circles CW/CCW 2 x 10 Supine march up up down down x 10 - cues for breathing Supine oblique isometric 5 x 5 sec hold bilat Sit <> stand with red TB resistance at UEs 2 x 10 Quadruped alt hip ext x 10 bilat Shoulder ext green TB x 10 Green TB pull down with march x 10 Resisted walking green TB fwd x 10, bkwd x 10 Palloff press green TB 2 x 10 bilat Physioball roll out all directions   [facility] Adult PT Treatment:                                                DATE: 02/03/24 Therapeutic Exercise/Activity: ROM and strength measurements Bridge 5 x 10 sec hold --> bridge with yoga block squeeze 5 x 10 sec Supine march with core contraction x 10 Supine march up up down down x 10 - cues for breathing Supine oblique isometric 5 x 5 sec hold bilat Sidelying hip  abd Sidelying hip circles CW/CCW 2 x 10 Sit <> stand with red TB resistance at UEs 2 x 10 Quadruped alt hip ext x 10 bilat   [facility] Adult PT Treatment:                                           DATE: 01/06/2024 Therapeutic Exercise: Quadruped rock back to heels (lumbar flexion) + static cups on bilateral lumbar paraspinals/upper glutes Seated green PB roll out front x10 LTR  Bridges + yoga block b/w knees Side Lying: Open books --> hand behind head Straight leg hip abd Hip ext  in abd (small ROM) Manual Therapy: Cupping treatment:  Dynamic/gliding lumbar paraspinals Static - lumbar paraspinals & upper glutes Shearing technique with lumbar flexion    PATIENT EDUCATION:  Education details: Diaphragmatic breathing (HEP) Person educated: Patient Education method: Explanation, Demonstration, and Handouts Education comprehension: verbalized understanding and returned demonstration  HOME EXERCISE PROGRAM: Access Code: [email or web address] URL: [email or web address] Date: 02/16/2024 Prepared by: Reggy Eye  Exercises - Single Leg Bridge  - 1 x daily - 3-4 x weekly - 1 sets - 10 reps - Hooklying Sequential Leg March and Lower  - 1 x daily - 3-4 x weekly - 1 sets - 10 reps - Supine Lower Trunk Rotation  - 2 x daily - 7 x weekly - 1 sets - 5 reps - 10 sec hold - Diaphragmatic Breathing at 90/90 Supported  - 1 x daily - 7 x weekly - 3 sets - 10 reps - Sidelying Hip Abduction  - 1 x daily - 7 x weekly - 2 sets - 10 reps - Sidelying Hip Circles  - 1 x daily - 7 x weekly - 3 sets - 10 reps - Resistance Pulldown with March  - 1 x daily - 7 x weekly - 3 sets - 10 reps - Standing Anti-Rotation Press with Anchored Resistance  - 1 x daily - 7 x weekly - 3 sets - 10 reps - Sit to Stand Without Arm Support  - 1 x daily - 7 x weekly - 3 sets - 10 reps  Breast Reduction - post surgical exercises and h/o (see pt instructions)  Access Code: [email or web address] URL:  [email or web address] Date: 12/31/2023 Prepared by: Raynelle Fanning  Program Notes Here are the suggested stretches for after breast reduction surgery. Remember to avoid significant pain.   Exercises - Correct Seated Posture  - Seated Diaphragmatic Breathing  - 3 x daily - 7 x weekly - 10 reps - Shoulder External Rotation and Scapular Retraction  - 3 x daily - 7 x weekly - 5 reps - 3 seconds hold - Seated Chest Stretch with Hands Behind Head  - 3 x daily - 7 x weekly - 3 reps - 5 second hold - Standing Shoulder Flexion Wall Walk  - 3 x daily - 7 x weekly - 3 reps - 3 seconds hold - Standing Shoulder Abduction Finger Walk at Wall  - 3 x daily - 7 x weekly - 3 reps - 3 seconds hold  ASSESSMENT:  CLINICAL IMPRESSION:  Pt is progressing well and is able to tolerate increased exercise each visit. She has decreased back pain and states her main complaint is pain with prolonged standing. Continued to address this complaint through increased core endurance work. She will decide between d/c and hold next visit.  Eval: Patient is a 48 y.o. female who was seen today for physical therapy evaluation and treatment for left lumbar radiculopathy secondary to lumbar spondylosis. She also reports balance problems and has had one fall. She has deficits in lumbar ROM and strength and has pain limiting sitting and walking. Plan to assess balance fully at next visit. No significant balance deficits were evident today during evaluation. She will benefit from skilled PT to address these deficits.  She is scheduled for a lumbar injection in 2 days and breast reduction surgery in a week, so will miss some time due to restrictions following this surgery.     GOALS: Goals reviewed with patient? Yes  SHORT TERM GOALS: Target date: 01/12/2024   Patient will be independent with initial  HEP.  Baseline:  Goal status: INITIAL  2.  Patient will report centralization of radicular symptoms.  Baseline: no  change Goal status: IN PROGRESS  LONG TERM GOALS: Target date: 02/23/2024   Patient will be independent with advanced/ongoing HEP to improve outcomes and carryover.  Baseline: initial HEP Goal status: INITIAL  2.  Patient will report 75% improvement in low back pain to improve QOL.  Baseline: 4-7/10 Goal status: INITIAL  3.  Patient will demonstrate functional pain free lumbar ROM to perform ADLs.   Baseline: see objective chart Goal status: MET 02/03/24  4.  Patient will demonstrate improved strength to 5/5 to normalize ADLs. Baseline: see objective chart Goal status: IN PROGRESS  5.  Patient will report 14 on modified oswestry to demonstrate improved functional ability.  Baseline: 20 / 50 = 40.0 % Goal status: INITIAL  6.  Patient will demonstrate at least 19/24 on DGI to improve gait stability and reduce risk for falls.  Baseline: TBD Goal status: MET    PLAN:  PT FREQUENCY: 2x/week  PT DURATION: 8 weeks  PLANNED INTERVENTIONS: 97164- PT Re-evaluation, 97110-Therapeutic exercises, 97530- Therapeutic activity, [id number]- Neuromuscular re-education, 97535- Self Care, 16109- Manual therapy, [id number]- Gait training, [phone]- Aquatic Therapy, 97014- Electrical stimulation (unattended), [phone]- Traction (mechanical), Patient/Family education, Balance training, Taping, Dry Needling, Joint mobilization, Spinal mobilization, Cryotherapy, and Moist heat.  PLAN FOR NEXT SESSION: revise HEP, check goals - possible d/c or hold   Reggy Eye, PT,DPT02/25/252:44 PM

## 2024-02-17 ENCOUNTER — Ambulatory Visit (INDEPENDENT_AMBULATORY_CARE_PROVIDER_SITE_OTHER): Payer: MEDICAID | Admitting: Mental Health

## 2024-02-17 DIAGNOSIS — F603 Borderline personality disorder: Secondary | ICD-10-CM

## 2024-02-17 DIAGNOSIS — F319 Bipolar disorder, unspecified: Secondary | ICD-10-CM

## 2024-02-17 MED ORDER — ATORVASTATIN CALCIUM 20 MG PO TABS: 20.0000 mg | ORAL_TABLET | Freq: Every day | ORAL | 0 refills | Status: DC

## 2024-02-17 NOTE — Progress Notes (Unsigned)
 THERAPIST PROGRESS NOTE Virtual Visit via Video Note  I connected with Frances [name] on 02/17/24 at  4:00 PM EST by a video enabled telemedicine application and verified that I am speaking with the correct person using two identifiers.  Location: Patient: home address on file Provider: office   I discussed the limitations of evaluation and management by telemedicine and the availability of in person appointments. The patient expressed understanding and agreed to proceed.  I discussed the assessment and treatment plan with the patient. The patient was provided an opportunity to ask questions and all were answered. The patient agreed with the plan and demonstrated an understanding of the instructions.   The patient was advised to call back or seek an in-person evaluation if the symptoms worsen or if the condition fails to improve as anticipated.  I provided 32 minutes of non-face-to-face time during this encounter.   Frances [name], [facility]   Session Time: 4:05 pm ( 32 minutes)  Participation Level: Active  Behavioral Response: CasualAlertNeutral  Type of Therapy: Individual Therapy  Treatment Goals addressed: STG: Frances [name] will increase control over thoughts AEB ability to engage in x 3 coping skills with ability to reframe thoughts as needed within the next 90 days.   ProgressTowards Goals: Progressing  Interventions: Supportive  Summary:  [name] is a 48 y.o. female who presents with dx of Bipolar disorder, borderline personality disorder, GAD and ADHD. Presents for session alert and oriented; mood and affect blunted; neutral but stable. Speech clear and coherent at normal rate and tone. Shares to be doing ok and denies concerns for depression, anxiety or elevated moods. Shares to be medication complaint. Notes to be healing from surgery but continues to have pain in lower back. Shares has been engaging in community at higher rate with friend in which she  has allowed to reside with her but shares this at times can be draining. Shares some financial stressors but declines desire for work with plans to continue to apply for disability until she is able to receive. Shares has had a laywer in the past that dropped her and another lawyer declined to take her case but gave her referrals for additional lawyers. Shares she is unable to work due back pain and is unclear if she would like to engage in the work force or not. Shares for sleep and appetite to be WNL. Denies safety concerns. Shares coping skills of ability to ride horses and caring for cats; denies troublesome thoughts.  Suicidal/Homicidal: Nowithout intent/plan  Therapist Response:  Therapist engaged Frances [name] in tele-therapy session. Completed check in and reviewed current level of functioning, sxs management and level of stressors. Provided supportive encouragement; validated feelings. Engaged Frances [name] in processing thoughts of engaging with current friend and ability to set boundaries as and if needed and communicate needs. Explored desire for part time work with reported financial stress and reminded of employment resources. Assessed for difficult thoughts and presence of mood sxs. Reviewed session and provided follow up.   Plan: Return again in  x 6 weeks.  Diagnosis: Bipolar I disorder (HCC)  Borderline personality disorder (HCC)  Collaboration of Care: Other None  Patient/Guardian was advised Release of Information must be obtained prior to any record release in order to collaborate their care with an outside provider. Patient/Guardian was advised if they have not already done so to contact the registration department to sign all necessary forms in order for [place] to release information regarding their care.   Consent:  Patient/Guardian gives verbal consent for treatment and assignment of benefits for services provided during this visit. Patient/Guardian expressed understanding and agreed to proceed.    Stephan Minister [place], [facility] 02/17/2024

## 2024-02-18 ENCOUNTER — Ambulatory Visit: Payer: MEDICAID

## 2024-02-18 DIAGNOSIS — R252 Cramp and spasm: Secondary | ICD-10-CM

## 2024-02-18 DIAGNOSIS — M545 Low back pain, unspecified: Secondary | ICD-10-CM

## 2024-02-18 DIAGNOSIS — M6281 Muscle weakness (generalized): Secondary | ICD-10-CM

## 2024-02-18 DIAGNOSIS — R2681 Unsteadiness on feet: Secondary | ICD-10-CM

## 2024-02-18 DIAGNOSIS — R2689 Other abnormalities of gait and mobility: Secondary | ICD-10-CM

## 2024-02-18 DIAGNOSIS — R293 Abnormal posture: Secondary | ICD-10-CM

## 2024-02-18 DIAGNOSIS — M25551 Pain in right hip: Secondary | ICD-10-CM

## 2024-02-18 DIAGNOSIS — M5416 Radiculopathy, lumbar region: Secondary | ICD-10-CM | POA: Diagnosis not present

## 2024-02-18 DIAGNOSIS — M797 Fibromyalgia: Principal | ICD-10-CM

## 2024-02-18 MED ORDER — DULOXETINE 30 MG CAPSULE,DELAYED RELEASE
ORAL_CAPSULE | Freq: Every day | ORAL | 3 refills | 0.00 days
Start: 2024-02-18 — End: ?

## 2024-02-18 MED ORDER — HYDROXYCHLOROQUINE 200 MG TABLET
ORAL_TABLET | Freq: Two times a day (BID) | ORAL | 3 refills | 90.00 days | Status: CP
Start: 2024-02-18 — End: ?

## 2024-02-18 NOTE — Therapy (Addendum)
 OUTPATIENT PHYSICAL THERAPY THORACOLUMBAR TREATMENT AND DISCHARGE   Patient Name: Frances [name] MRN: [id number] DOB:1976/08/20, 48 y.o., female Today's Date: 02/18/2024  END OF SESSION:  PT End of Session - 02/18/24 1102     Visit Number 8    Number of Visits 12    Date for PT Re-Evaluation 02/23/24    Authorization Type [employer]    PT Start Time 1102    PT Stop Time 1145    PT Time Calculation (min) 43 min    Activity Tolerance Patient tolerated treatment well    Behavior During Therapy WFL for tasks assessed/performed            Past Medical History:  Diagnosis Date   Anxiety    Arthritis    Back pain, chronic    Bipolar 1 disorder (HCC)    Chronic fatigue syndrome    DDD (degenerative disc disease), lumbar    Depression    Diabetes mellitus without complication (HCC)    Fibromyalgia    IUD 2012   Mirena   MTHFR gene mutation    Rheumatoid arteritis (HCC)    Past Surgical History:  Procedure Laterality Date   BREAST REDUCTION SURGERY Bilateral 01/07/2024   Procedure: Bilateral breast reduction with liposuction;  Surgeon: Peggye Form, DO;  Location: [facility];  Service: Plastics;  Laterality: Bilateral;   COLONOSCOPY WITH PROPOFOL N/A 02/25/2017   Procedure: COLONOSCOPY WITH PROPOFOL;  Surgeon: Willis Modena, MD;  Location: [facility];  Service: Endoscopy;  Laterality: N/A;   left knee lateral release  1993   scar tisue removal  03/2005   left ankle   TONSILLECTOMY  age 67's   Patient Active Problem List   Diagnosis Date Noted   Dysuria 01/28/2024   Pain in left third proximal interphalangeal joint 10/16/2023   Personal history UTI 09/14/2023   Rheumatoid arthritis 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 (HCC) 09/14/2023   Urinary hesitancy 06/30/2023   Urinary frequency 06/16/2023   OAB (overactive bladder) 06/16/2023   Chronic pain syndrome 06/10/2023   Crush injury of right foot 05/25/2023    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   Microalbuminuria 09/09/2022   Night sweats 06/18/2022   Proteinuria 03/17/2022   Chronic right hip pain 01/06/2022   Acute pain of right shoulder 01/06/2022   ETD (Eustachian tube dysfunction), bilateral 08/23/2021   SOB (shortness of breath) on exertion 08/20/2021   Overweight (BMI 25.0-29.9) 06/18/2021   Attention deficit hyperactivity disorder (ADHD), 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   Hyperlipidemia LDL goal <70 03/18/2021   Diabetes mellitus (HCC) 03/13/2021   Seasonal allergies 12/25/2020   Bilateral leg edema 11/19/2020   Symptomatic mammary hypertrophy 09/04/2020   Recurrent major depressive disorder, in partial remission (HCC) 05/24/2020   Generalized anxiety disorder 05/24/2020   DDD (degenerative disc disease), cervical 03/13/2020   Ingrown right big toenail 03/13/2020   Labral tear of right hip joint, degenerative 01/11/2020   Grief reaction 11/28/2019   Elevated fasting glucose 04/08/2018   Borderline personality disorder (HCC) 01/22/2018   Seronegative rheumatoid arthritis (HCC) 12/09/2017   New daily persistent headache 09/26/2017   MTHFR gene mutation 09/20/2017   Bipolar I disorder (HCC) 09/16/2017   Chronic fatigue syndrome 09/16/2017   Back pain 10/13/2008   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   Lumbar spondylosis 02/10/2008   FIBROMYALGIA 02/10/2008   Other specified abnormal findings of blood chemistry 02/10/2008    PCP: Jomarie Longs, PA-C   REFERRING PROVIDER: Jomarie Longs,  PA-C   REFERRING DIAG: M47.816 (ICD-10-CM) - Lumbar spondylosis   Rationale for Evaluation and Treatment: Rehabilitation  THERAPY DIAG:  Radiculopathy, lumbar region  Muscle weakness (generalized)  Cramp and spasm  Unsteadiness on feet  Pain in right hip  Abnormal posture  Other abnormalities of gait and mobility  Chronic bilateral low back pain without sciatica  ONSET DATE: 2 weeks ago I  think  SUBJECTIVE:                                                                                                                                                                                           SUBJECTIVE STATEMENT: Patient reports she is back to taking hydrocodone and is able to tolerate more. Patient states she has been feeding and grooming horses but is not cleared yet to ride. Patient states her sciatica is back.   PERTINENT HISTORY:  L3-4 lumbar spondylosis, DM, anxiety/depression, BPD, RA  PAIN:  Are you having pain? Yes: NPRS scale: 4/10 Pain location: Left low back around hip to ant thigh to knee Pain description: tingles, burns, numbness  on the left side and just to knee Aggravating factors: walking sitting in hard chairs Relieving factors: bending, lying down  PRECAUTIONS: None  RED FLAGS: None   WEIGHT BEARING RESTRICTIONS: No  FALLS:  Has patient fallen in last 6 months? Yes. Number of falls 1  LIVING ENVIRONMENT: Lives with: lives alone Lives in: House/apartment Stairs: Yes: External: 2 steps; none Has following equipment at home: None  OCCUPATION: unemployed  PLOF: Independent  PATIENT GOALS: reduce pain  NEXT MD VISIT: 01/28/24  OBJECTIVE:  Note: Objective measures were completed at Evaluation unless otherwise noted.  DIAGNOSTIC FINDINGS:  None recent  PATIENT SURVEYS:  Modified Oswestry 20 / 50 = 40.0 %   POSTURE: rounded shoulders and forward head  PALPATION: L gluteals and lumbar  LUMBAR ROM: *pain  AROM eval 02/03/24  Flexion full full  Extension 75% * left sided 75* pain right  Right lateral flexion Hand to mid thigh pain R Hand to mid thigh  Left lateral flexion Hand to greater trochanter Hand to mid thigh  Right rotation Full full  Left rotation 75%; passive full full   (Blank rows = not tested)  LOWER EXTREMITY ROM:   [facility]  LOWER EXTREMITY MMT:  Grossly 5/5 except where noted  MMT Right eval Left eval Left 02/03/24  Left 02/18/24  Hip flexion 5 3+* in back 4 4+  Hip extension 5 4+  4+  Hip abduction 5 4+  5  Hip adduction 5 4+    Hip internal rotation  Hip external rotation      Knee flexion 5 4+    Knee extension 5 5    Ankle dorsiflexion 5 5    Ankle plantarflexion      Ankle inversion      Ankle eversion       (Blank rows = not tested)  LUMBAR SPECIAL TESTS:  Long sit test: Negative  FUNCTIONAL TESTS:  5 times sit to stand: 15.9 sec Dynamic Gait Index: 24/24  12/31/23 12/31/23 MCTSIB 120/120 BERG 55/56 SLS R 9 sec, L 30 sec   [facility] Adult PT Treatment:                                                DATE: 02/18/2024 Therapeutic Exercise: Supine (with Oswestry intake): Sciatic nerve glide x10 Modified figure 4 stretch Figure 4 LTR (L) A/P pelvic tilts + pelvis on deflated coregeous ball Bridges w/deflated coregeous ball behind pelvis x10 Quadruped rock back Manual Therapy: Cupping treatment for myofascial decompression:  Static/dynamic/gliding lumbar paraspinals & upper glutes    [facility] Adult PT Treatment:                                                DATE: 02/16/24 Therapeutic Exercise/Activity: Nustep L6 x 5 min UEs and LEs for warm up row green TB x 20 Green TB pull down with march x 15 Pallof green TB 2 x 10 Sit <> stand 10# KB x 10  --> sit <> stand with 10# KB unilateral hold x 10 bilat Resisted walking 10# fwd/bkwd x 10 Resisted walking laterally 5# x 10 bilat Sidelying hip abd x 10 bilat Sidelying hip circles 10 each CW/CCW Seated ab isometric with physioball x 10 Seated oblique isometric with physioball x 10 bilat   [facility] Adult PT Treatment:                                                DATE: 02/09/24 Therapeutic Exercise/Activity: Nustep L6 x 5 min UEs and LEs for warm up Sidelying hip abd Sidelying hip circles CW/CCW 2 x 10 Supine march up up down down x 10 - cues for breathing Supine oblique isometric 5 x 5 sec hold bilat Sit <> stand with red TB resistance  at UEs 2 x 10 Quadruped alt hip ext x 10 bilat Shoulder ext green TB x 10 Green TB pull down with march x 10 Resisted walking green TB fwd x 10, bkwd x 10 Palloff press green TB 2 x 10 bilat Physioball roll out all directions   PATIENT EDUCATION:  Education details: Diaphragmatic breathing (HEP) Person educated: Patient Education method: Explanation, Demonstration, and Handouts Education comprehension: verbalized understanding and returned demonstration  HOME EXERCISE PROGRAM: Access Code: [email or web address] URL: [email or web address] Date: 02/16/2024 Prepared by: Reggy Eye  Exercises - Single Leg Bridge  - 1 x daily - 3-4 x weekly - 1 sets - 10 reps - Hooklying Sequential Leg March and Lower  - 1 x daily - 3-4 x weekly - 1 sets - 10 reps - Supine Lower Trunk Rotation  - 2 x daily - 7  x weekly - 1 sets - 5 reps - 10 sec hold - Diaphragmatic Breathing at 90/90 Supported  - 1 x daily - 7 x weekly - 3 sets - 10 reps - Sidelying Hip Abduction  - 1 x daily - 7 x weekly - 2 sets - 10 reps - Sidelying Hip Circles  - 1 x daily - 7 x weekly - 3 sets - 10 reps - Resistance Pulldown with March  - 1 x daily - 7 x weekly - 3 sets - 10 reps - Standing Anti-Rotation Press with Anchored Resistance  - 1 x daily - 7 x weekly - 3 sets - 10 reps - Sit to Stand Without Arm Support  - 1 x daily - 7 x weekly - 3 sets - 10 reps  Breast Reduction - post surgical exercises and h/o (see pt instructions)  Access Code: [email or web address] URL: [email or web address] Date: 12/31/2023 Prepared by: Raynelle Fanning  Program Notes Here are the suggested stretches for after breast reduction surgery. Remember to avoid significant pain.   Exercises - Correct Seated Posture  - Seated Diaphragmatic Breathing  - 3 x daily - 7 x weekly - 10 reps - Shoulder External Rotation and Scapular Retraction  - 3 x daily - 7 x weekly - 5 reps - 3 seconds hold - Seated Chest Stretch with Hands Behind Head  - 3 x daily -  7 x weekly - 3 reps - 5 second hold - Standing Shoulder Flexion Wall Walk  - 3 x daily - 7 x weekly - 3 reps - 3 seconds hold - Standing Shoulder Abduction Finger Walk at Wall  - 3 x daily - 7 x weekly - 3 reps - 3 seconds hold  ASSESSMENT:  CLINICAL IMPRESSION: Sciatic nerve glides and dynamic piriformis stretches incorporated to address subjective sciatic symptoms. Mild improvement in hip strength as measured by MMT, with greatest improvement demonstrated with hip abd on L LE. Cupping for myofascial decompression performed to bilateral lumbar paraspinals and upper glutes to address pain and myofascial tension. PT placed on hold per discussion between patient and PT at previous session.   Eval: Patient is a 48 y.o. female who was seen today for physical therapy evaluation and treatment for left lumbar radiculopathy secondary to lumbar spondylosis. She also reports balance problems and has had one fall. She has deficits in lumbar ROM and strength and has pain limiting sitting and walking. Plan to assess balance fully at next visit. No significant balance deficits were evident today during evaluation. She will benefit from skilled PT to address these deficits.  She is scheduled for a lumbar injection in 2 days and breast reduction surgery in a week, so will miss some time due to restrictions following this surgery.     GOALS: Goals reviewed with patient? Yes  SHORT TERM GOALS: Target date: 01/12/2024   Patient will be independent with initial HEP.  Baseline:  Goal status: INITIAL  2.  Patient will report centralization of radicular symptoms.  Baseline: no change Goal status: IN PROGRESS  LONG TERM GOALS: Target date: 02/23/2024   Patient will be independent with advanced/ongoing HEP to improve outcomes and carryover.  Baseline: initial HEP Goal status: MET  2.  Patient will report 75% improvement in low back pain to improve QOL.  Baseline: 4-7/10; 02/18/24: 30% improvement Goal status:  IN PROGRESSION  3.  Patient will demonstrate functional pain free lumbar ROM to perform ADLs.   Baseline: see objective chart Goal status: MET 02/03/24  4.  Patient will demonstrate improved strength to 5/5 to normalize ADLs. Baseline: see objective chart; 02/18/24: hip flex & ext 4+ Goal status: IN PROGRESS  5.  Patient will report 14 on modified oswestry to demonstrate improved functional ability.  Baseline: 20 / 50 = 40.0 %; 02/18/24: 15/50 = 50% Goal status: IN PROGRESS  6.  Patient will demonstrate at least 19/24 on DGI to improve gait stability and reduce risk for falls.  Baseline: TBD Goal status: MET    PLAN:  PT FREQUENCY: 2x/week  PT DURATION: 8 weeks  PLANNED INTERVENTIONS: 97164- PT Re-evaluation, 97110-Therapeutic exercises, 97530- Therapeutic activity, [id number]- Neuromuscular re-education, 97535- Self Care, [id number]- Manual therapy, [id number]- Gait training, [phone]- Aquatic Therapy, 97014- Electrical stimulation (unattended), [phone]- Traction (mechanical), Patient/Family education, Balance training, Taping, Dry Needling, Joint mobilization, Spinal mobilization, Cryotherapy, and Moist heat.  PLAN FOR NEXT SESSION: Place on hold; if returns, needs to see PT   Carlynn Herald, PTA 02/18/24, 11:49 AM  PHYSICAL THERAPY DISCHARGE SUMMARY  Visits from Start of Care: 8  Current functional level related to goals / functional outcomes: Pt has improved symptom management and is improving mobility and strength   Remaining deficits: See above   Education / Equipment: HEP   Patient agrees to discharge. Patient goals were partially met. Patient is being discharged due to being pleased with the current functional level.  Reggy Eye, PT,DPT03/07/259:05 AM

## 2024-02-19 ENCOUNTER — Other Ambulatory Visit: Payer: Self-pay | Admitting: Sports Medicine

## 2024-02-19 DIAGNOSIS — M47816 Spondylosis without myelopathy or radiculopathy, lumbar region: Secondary | ICD-10-CM

## 2024-02-19 DIAGNOSIS — M797 Fibromyalgia: Principal | ICD-10-CM

## 2024-02-19 MED ORDER — DULOXETINE 30 MG CAPSULE,DELAYED RELEASE
ORAL_CAPSULE | Freq: Every day | ORAL | 3 refills | 90.00 days | Status: CP
Start: 2024-02-19 — End: ?

## 2024-02-23 ENCOUNTER — Other Ambulatory Visit: Payer: Self-pay | Admitting: Physician Assistant

## 2024-02-23 DIAGNOSIS — M47816 Spondylosis without myelopathy or radiculopathy, lumbar region: Secondary | ICD-10-CM

## 2024-02-23 NOTE — Telephone Encounter (Signed)
 Last Fill: Unknown  Last OV: 01/28/24 Next OV: 02/25/24  Routing to provider for review/authorization.

## 2024-02-23 NOTE — Telephone Encounter (Unsigned)
 Copied from CRM [id number]. Topic: Clinical - Medication Refill >> Feb 23, 2024  3:16 PM Carlatta H wrote: Most Recent Primary Care Visit:  Provider: Monica Becton  Department: [facility]  Visit Type: OFFICE VISIT  Date: 01/28/2024  Medication: pregabalin (LYRICA) 200 MG capsule [[id number]]  Has the patient contacted their pharmacy? Yes (Agent: If no, request that the patient contact the pharmacy for the refill. If patient does not wish to contact the pharmacy document the reason why and proceed with request.) (Agent: If yes, when and what did the pharmacy advise?)Dr office  Is this the correct pharmacy for this prescription? Yes If no, delete pharmacy and type the correct one.  This is the patient's preferred pharmacy:  [facility], [place] - [address] [place] [address] Phone: [phone] Fax: [phone]     Has the prescription been filled recently? No  Is the patient out of the medication? Yes  Has the patient been seen for an appointment in the last year OR does the patient have an upcoming appointment? Yes  Can we respond through MyChart? Yes  Agent: Please be advised that Rx refills may take up to 3 business days. We ask that you follow-up with your pharmacy.

## 2024-02-24 ENCOUNTER — Encounter: Payer: Self-pay | Admitting: Physician Assistant

## 2024-02-24 ENCOUNTER — Ambulatory Visit: Payer: MEDICAID | Admitting: Physician Assistant

## 2024-02-24 VITALS — BP 126/85 | HR 110

## 2024-02-24 DIAGNOSIS — Z9889 Other specified postprocedural states: Secondary | ICD-10-CM

## 2024-02-24 NOTE — Progress Notes (Signed)
 Patient is a pleasant 48 year old female s/p bilateral breast reduction with liposuction performed 01/07/2024 by Dr. Ulice Bold who presents to clinic for postoperative follow-up.   She was last seen here in clinic on 02/10/2024.  At that time, she had recently been PT which was helping with her postoperative discomfort.  Breasts had excellent shape and symmetry, soft throughout.  Old ecchymoses left lateral breast where she had initial postop discomfort.  Follow-up in 2 weeks for likely final postoperative encounter.  Today, patient is doing well.  She states that sometimes she experiences some discomfort underneath her left breast, but does not believe that there are any wounds.  She may have a bump underneath the skin suggestive of nonabsorbable suture.  Otherwise, she is quite pleased with the outcome from her surgery.  She has not noticed a tremendous difference in terms of back/neck pain alleviation, but suspects that she will appreciate the change when she begins horseback riding again.  On exam, breasts have excellent shape and symmetry.  Breasts are soft throughout.  NAC's are healthy and viable.  Incisions CDI throughout, no wounds noted.  Possible protruding suture from underneath the skin left inframammary incision.  The suture underneath the left breast is underneath the skin, do not want to attempt removal at this time.  She understands that she can call the office if it protrudes in the future.  Otherwise, recommending silicone scar gels twice daily x 3 months.  Otherwise, increase activity as tolerated.  No ongoing restrictions.  Follow-up as needed.  Pictures obtained at last visit.

## 2024-02-25 ENCOUNTER — Other Ambulatory Visit (INDEPENDENT_AMBULATORY_CARE_PROVIDER_SITE_OTHER): Payer: MEDICAID

## 2024-02-25 ENCOUNTER — Encounter: Payer: Self-pay | Admitting: Sports Medicine

## 2024-02-25 ENCOUNTER — Ambulatory Visit: Payer: MEDICAID | Admitting: Sports Medicine

## 2024-02-25 DIAGNOSIS — M79645 Pain in left finger(s): Secondary | ICD-10-CM

## 2024-02-25 DIAGNOSIS — M25511 Pain in right shoulder: Secondary | ICD-10-CM | POA: Diagnosis not present

## 2024-02-25 DIAGNOSIS — M47816 Spondylosis without myelopathy or radiculopathy, lumbar region: Secondary | ICD-10-CM

## 2024-02-25 DIAGNOSIS — M7541 Impingement syndrome of right shoulder: Secondary | ICD-10-CM | POA: Diagnosis not present

## 2024-02-25 DIAGNOSIS — G5603 Carpal tunnel syndrome, bilateral upper limbs: Secondary | ICD-10-CM

## 2024-02-25 MED ORDER — PREGABALIN 200 MG PO CAPS: ORAL_CAPSULE | ORAL | 3 refills | Status: DC

## 2024-02-25 MED ORDER — TRIAMCINOLONE ACETONIDE 40 MG/ML IJ SUSP
60.0000 mg | Freq: Once | INTRAMUSCULAR | Status: AC
  Administered 2024-02-25: 60 mg via INTRAMUSCULAR

## 2024-02-25 MED ORDER — PREGABALIN 200 MG PO CAPS: ORAL_CAPSULE | ORAL | 0 refills | Status: DC

## 2024-02-25 NOTE — Progress Notes (Signed)
    Procedures performed today:    Procedure: Real-time Ultrasound Guided injection of the left third PIP Device: Samsung HS60  Verbal informed consent obtained.  Time-out conducted.  Noted no overlying erythema, induration, or other signs of local infection.  Skin prepped in a sterile fashion.  Local anesthesia: Topical Ethyl chloride.  With sterile technique and under real time ultrasound guidance: Arthritic images noted, 0.5 cc lidocaine, 0.5 cc kenalog 40 injected easily Completed without difficulty  Advised to call if fevers/chills, erythema, induration, drainage, or persistent bleeding.  Images permanently stored and available for review in PACS.  Impression: Technically successful ultrasound guided injection.  Procedure: Real-time Ultrasound Guided injection of the right subacromial bursa Device: Samsung HS60  Verbal informed consent obtained.  Time-out conducted.  Noted no overlying erythema, induration, or other signs of local infection.  Skin prepped in a sterile fashion.  Local anesthesia: Topical Ethyl chloride.  With sterile technique and under real time ultrasound guidance: Noted intact cuff, 1 cc Kenalog 40, 1 cc lidocaine, 1 cc bupivacaine injected easily Completed without difficulty  Advised to call if fevers/chills, erythema, induration, drainage, or persistent bleeding.  Images permanently stored and available for review in PACS.  Impression: Technically successful ultrasound guided injection.  Independent interpretation of notes and tests performed by another provider:   None.  Brief History, Exam, Impression, and Recommendations:    Impingement syndrome, shoulder, right Frances [name] returns, she has chronic right shoulder pain, historically MRI did show subacromial bursitis and glenohumeral osteoarthritis, back in April injected her glenohumeral and subacromial spaces, she returned pain-free, she is not having recurrence of pain, she has impingement signs on exam,  we did an ultrasound-guided subacromial injection today, return to see me 6 weeks and if not significantly better we will try glenohumeral injection.  Pain in left third proximal interphalangeal joint Frances [name] has also been dealing with left third PIP pain for approximately 6 months to 7 months. X-rays have been negative, MRI did show scattered interphalangeal joint arthritic changes, due to failure of conservative treatment including topical and oral Voltaren we proceeded today with a left third PIP injection, return to see me in 6 weeks if needed.  Carpal tunnel syndrome, bilateral Also continues to have numbness and tingling right hand, she did have a negative nerve conduction and EMG. This still may represent subclinical carpal tunnel syndrome, symptoms not radicular. I would like her to do some home conditioning and get being back nocturnal cock up splints. Return to see me 6 weeks for this, hydrodissection if not better.    ____________________________________________ Ihor Austin. Benjamin Stain, M.D., ABFM., CAQSM., AME. Primary Care and Sports Medicine [facility]  Adjunct Professor of Family Medicine  [place] [employer]  Restaurant manager, fast food

## 2024-02-25 NOTE — Assessment & Plan Note (Signed)
 Frances [name] has also been dealing with left third PIP pain for approximately 6 months to 7 months. X-rays have been negative, MRI did show scattered interphalangeal joint arthritic changes, due to failure of conservative treatment including topical and oral Voltaren we proceeded today with a left third PIP injection, return to see me in 6 weeks if needed.

## 2024-02-25 NOTE — Assessment & Plan Note (Signed)
 Also continues to have numbness and tingling right hand, she did have a negative nerve conduction and EMG. This still may represent subclinical carpal tunnel syndrome, symptoms not radicular. I would like her to do some home conditioning and get being back nocturnal cock up splints. Return to see me 6 weeks for this, hydrodissection if not better.

## 2024-02-25 NOTE — Assessment & Plan Note (Signed)
 Frances [name] returns, she has chronic right shoulder pain, historically MRI did show subacromial bursitis and glenohumeral osteoarthritis, back in April injected her glenohumeral and subacromial spaces, she returned pain-free, she is not having recurrence of pain, she has impingement signs on exam, we did an ultrasound-guided subacromial injection today, return to see me 6 weeks and if not significantly better we will try glenohumeral injection.

## 2024-02-25 NOTE — Addendum Note (Signed)
 Addended by: Samule Dry on: 02/25/2024 11:50 AM   Modules accepted: Orders

## 2024-02-26 ENCOUNTER — Ambulatory Visit: Payer: MEDICAID | Attending: Sports Medicine | Admitting: Rehabilitative and Restorative Service Providers"

## 2024-02-26 ENCOUNTER — Other Ambulatory Visit: Payer: Self-pay

## 2024-02-26 ENCOUNTER — Encounter: Payer: Self-pay | Admitting: Rehabilitative and Restorative Service Providers"

## 2024-02-26 DIAGNOSIS — M6281 Muscle weakness (generalized): Secondary | ICD-10-CM | POA: Diagnosis present

## 2024-02-26 DIAGNOSIS — M5416 Radiculopathy, lumbar region: Secondary | ICD-10-CM | POA: Insufficient documentation

## 2024-02-26 DIAGNOSIS — M542 Cervicalgia: Secondary | ICD-10-CM | POA: Insufficient documentation

## 2024-02-26 DIAGNOSIS — M25511 Pain in right shoulder: Secondary | ICD-10-CM | POA: Insufficient documentation
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
## 2024-02-29 ENCOUNTER — Other Ambulatory Visit: Payer: Self-pay | Admitting: Physician Assistant

## 2024-02-29 DIAGNOSIS — M0609 Rheumatoid arthritis without rheumatoid factor, multiple sites: Secondary | ICD-10-CM

## 2024-02-29 DIAGNOSIS — G8929 Other chronic pain: Secondary | ICD-10-CM

## 2024-02-29 DIAGNOSIS — M51369 Other intervertebral disc degeneration, lumbar region without mention of lumbar back pain or lower extremity pain: Secondary | ICD-10-CM

## 2024-03-01 ENCOUNTER — Encounter: Payer: MEDICAID | Admitting: Physical Therapy

## 2024-03-02 ENCOUNTER — Ambulatory Visit (INDEPENDENT_AMBULATORY_CARE_PROVIDER_SITE_OTHER): Payer: MEDICAID | Admitting: Physician Assistant

## 2024-03-02 VITALS — BP 119/84 | HR 104

## 2024-03-02 DIAGNOSIS — Z9889 Other specified postprocedural states: Secondary | ICD-10-CM

## 2024-03-02 NOTE — Progress Notes (Signed)
 Patient is a pleasant 48 year old female s/p bilateral breast reduction with liposuction performed 01/07/2024 by Dr. Ulice Bold who presents to clinic for postoperative follow-up.   She was last seen here in clinic on 02/24/2024.  At that time, she was doing excellent from a postoperative standpoint.  She did feel as though there was perhaps a bump under the skin suggestive of on absorbed suture.  However, it was not yet protruding through the skin.  Informed patient that she can return for removal in the future if it were to protrude, but recommended gentle massage in interim to help with breakdown.  Today, she states that she can feel the suture now protruding from her left inframammary incision, just medial to the inferior T zone.  She is hoping that it can be trimmed today.  On examination, there is a short suture tail protruding just through the epidermis along left inframammary incision, just medial from inferior T zone.  Tender to palpation.  No erythema or surrounding induration.  No fluctuance or palpable underlying fluid collection.  Sutures gently trimmed with minimal bleeding provoked.  Applied a triple antibiotic followed by a Band-Aid.  She can wash with soap and water at home as she normally would.  No specific follow-up needed.  She understands that the sutures used are entirely absorbable and if she feels any bumps underneath the skin she can simply massage them as they will likely break down shortly.  Continue with scar management.  Follow-up as needed.

## 2024-03-07 MED ORDER — HYDROCODONE-ACETAMINOPHEN 5-325 MG PO TABS: 1.0000 | ORAL_TABLET | Freq: Two times a day (BID) | ORAL | 0 refills | Status: DC | PRN

## 2024-03-07 NOTE — Telephone Encounter (Signed)
 Requesting rx rf of hydrocodone acet 5-325 Last written 12/08/2023 Last OV 12/09/2023 Upcoming appt 03/08/24

## 2024-03-08 ENCOUNTER — Ambulatory Visit (INDEPENDENT_AMBULATORY_CARE_PROVIDER_SITE_OTHER): Payer: MEDICAID | Admitting: Physician Assistant

## 2024-03-08 ENCOUNTER — Encounter: Payer: Self-pay | Admitting: Physical Therapy

## 2024-03-08 ENCOUNTER — Ambulatory Visit: Payer: MEDICAID | Admitting: Physical Therapy

## 2024-03-08 ENCOUNTER — Encounter: Payer: Self-pay | Admitting: Physician Assistant

## 2024-03-08 VITALS — BP 121/83 | HR 97 | Ht 67.0 in | Wt 188.2 lb

## 2024-03-08 DIAGNOSIS — M25511 Pain in right shoulder: Secondary | ICD-10-CM

## 2024-03-08 DIAGNOSIS — Z7984 Long term (current) use of oral hypoglycemic drugs: Secondary | ICD-10-CM | POA: Diagnosis not present

## 2024-03-08 DIAGNOSIS — M6281 Muscle weakness (generalized): Secondary | ICD-10-CM

## 2024-03-08 DIAGNOSIS — R8290 Unspecified abnormal findings in urine: Secondary | ICD-10-CM | POA: Diagnosis not present

## 2024-03-08 DIAGNOSIS — E1169 Type 2 diabetes mellitus with other specified complication: Secondary | ICD-10-CM

## 2024-03-08 LAB — POCT URINALYSIS DIP (CLINITEK)
Bilirubin, UA: NEGATIVE
Glucose, UA: NEGATIVE mg/dL
Ketones, POC UA: NEGATIVE mg/dL
Leukocytes, UA: NEGATIVE
Nitrite, UA: NEGATIVE
POC PROTEIN,UA: NEGATIVE
Spec Grav, UA: 1.015 (ref 1.010–1.025)
Urobilinogen, UA: 0.2 U/dL
pH, UA: 6 (ref 5.0–8.0)

## 2024-03-08 LAB — POCT GLYCOSYLATED HEMOGLOBIN (HGB A1C): Hemoglobin A1C: 6 % — AB (ref 4.0–5.6)

## 2024-03-08 MED ORDER — METFORMIN HCL 1000 MG PO TABS: 1000.0000 mg | ORAL_TABLET | Freq: Two times a day (BID) | ORAL | 0 refills | Status: DC
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
## 2024-03-08 NOTE — Progress Notes (Unsigned)
   Established Patient Office Visit  Subjective   Patient ID: Frances [name], female    DOB: Feb 15, 1976  Age: 48 y.o. MRN: [id number]  Chief Complaint  Patient presents with  . Medical Management of Chronic Issues    T2DM last A1C 5.2    HPI  Odor  No ithcing   {History (Optional):23778}  ROS    Objective:     BP 121/83 (BP Location: Left Arm, Patient Position: Sitting, Cuff Size: Large)   Pulse 97   Ht 5\' 7"  (1.702 m)   Wt 188 lb 4 oz (85.4 kg)   SpO2 99%   BMI 29.48 kg/m  {Vitals History (Optional):23777}  Physical Exam   Results for orders placed or performed in visit on 03/08/24  POCT HgB A1C  Result Value Ref Range   Hemoglobin A1C 6.0 (A) 4.0 - 5.6 %   HbA1c POC (<> result, manual entry)     HbA1c, POC (prediabetic range)     HbA1c, POC (controlled diabetic range)       The 10-year ASCVD risk score (Arnett DK, et al., 2019) is: 1.9%    Assessment & Plan:     No follow-ups on file.    Tandy Gaw, PA-C

## 2024-03-09 ENCOUNTER — Encounter: Payer: Self-pay | Admitting: Physician Assistant

## 2024-03-10 ENCOUNTER — Encounter: Payer: Self-pay | Admitting: Physician Assistant

## 2024-03-10 ENCOUNTER — Other Ambulatory Visit: Payer: Self-pay | Admitting: Physician Assistant

## 2024-03-10 MED ORDER — CEPHALEXIN 500 MG PO CAPS: 500.0000 mg | ORAL_CAPSULE | Freq: Two times a day (BID) | ORAL | 0 refills | Status: DC

## 2024-03-10 NOTE — Progress Notes (Signed)
 Bacteria seen. Will send keflex to start for UTI. Sent to [facility] [place].

## 2024-03-11 ENCOUNTER — Encounter: Payer: Self-pay | Admitting: Plastic Surgery

## 2024-03-11 ENCOUNTER — Ambulatory Visit: Payer: MEDICAID

## 2024-03-11 DIAGNOSIS — M25511 Pain in right shoulder: Secondary | ICD-10-CM | POA: Diagnosis not present

## 2024-03-11 DIAGNOSIS — M6281 Muscle weakness (generalized): Secondary | ICD-10-CM
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
## 2024-03-12 LAB — URINE CULTURE

## 2024-03-14 ENCOUNTER — Ambulatory Visit (INDEPENDENT_AMBULATORY_CARE_PROVIDER_SITE_OTHER): Payer: MEDICAID | Admitting: Mental Health

## 2024-03-14 ENCOUNTER — Ambulatory Visit
Admission: EM | Admit: 2024-03-14 | Discharge: 2024-03-14 | Disposition: A | Discharge status: Home / Self Care | Payer: MEDICAID | Attending: Family Medicine | Admitting: Family Medicine

## 2024-03-14 ENCOUNTER — Ambulatory Visit (HOSPITAL_COMMUNITY): Payer: MEDICAID | Admitting: Mental Health

## 2024-03-14 ENCOUNTER — Other Ambulatory Visit: Payer: Self-pay

## 2024-03-14 DIAGNOSIS — F603 Borderline personality disorder: Secondary | ICD-10-CM

## 2024-03-14 DIAGNOSIS — M545 Low back pain, unspecified: Secondary | ICD-10-CM

## 2024-03-14 DIAGNOSIS — F319 Bipolar disorder, unspecified: Secondary | ICD-10-CM | POA: Diagnosis not present

## 2024-03-14 DIAGNOSIS — M5441 Lumbago with sciatica, right side: Secondary | ICD-10-CM

## 2024-03-14 DIAGNOSIS — G8929 Other chronic pain: Secondary | ICD-10-CM

## 2024-03-14 MED ORDER — METHYLPREDNISOLONE ACETATE 80 MG/ML IJ SUSP
80.0000 mg | Freq: Once | INTRAMUSCULAR | Status: AC
  Administered 2024-03-14: 80 mg via INTRAMUSCULAR

## 2024-03-14 MED ORDER — KETOROLAC TROMETHAMINE 30 MG/ML IJ SOLN
30.0000 mg | Freq: Once | INTRAMUSCULAR | Status: AC
  Administered 2024-03-14: 30 mg via INTRAMUSCULAR

## 2024-03-14 NOTE — Discharge Instructions (Signed)
 Reduce activity while back is painful May use ice or heat to area The steroid shot will help for several days See your doctor if not improving by the end of the week

## 2024-03-14 NOTE — Progress Notes (Signed)
THERAPIST PROGRESS NOTE Virtual Visit via Video Note  I connected with Frances [name] on 03/14/24 at  9:00 AM EDT by a video enabled telemedicine application and verified that I am speaking with the correct person using two identifiers.  Location: Patient: home address on file Provider: home office   I discussed the limitations of evaluation and management by telemedicine and the availability of in person appointments. The patient expressed understanding and agreed to proceed.   I discussed the assessment and treatment plan with the patient. The patient was provided an opportunity to ask questions and all were answered. The patient agreed with the plan and demonstrated an understanding of the instructions.   The patient was advised to call back or seek an in-person evaluation if the symptoms worsen or if the condition fails to improve as anticipated.  I provided 38 minutes of non-face-to-face time during this encounter.   Dorris Singh, [facility]   Session Time: 9:05 am ( 38 minutes)  Participation Level: Active  Behavioral Response: CasualAlertWNL  Type of Therapy: Individual Therapy  Treatment Goals addressed:  STG: Frances [name] will increase control over thoughts AEB ability to engage in x 3 coping skills with ability to reframe thoughts as needed within the next 90 days.   ProgressTowards Goals: Progressing  Interventions: Supportive  Summary:  Frances [name] is a 48 y.o. female who presents with dx of Bipolar disorder, borderline personality disorder, GAD and ADHD. Presents for session alert and oriented; mood and affect blunted; neutral but stable. Speech clear and coherent at normal rate and tone. Denies excessive highs or lows and reports feeling of control over her thoughts. Shares to be doing ok  and share chief complaint of friend she is allowing to stay with ehr " she is driving me crazy." Shares thoughts of stressors related to having friend reside with her   " I live alone for a reason." Notes attempts to put in place boundaries but shares she will beg and she will give in to her needs. Explores working to maintain her boundaries and not give in allowing friend to endure consequences of her actions. Shares to for moods to have been 'up and down'  as a result but has been coping with spending time with horses. Notes better control over thoughts and regulation of moods. Enjoys coloring, spending time with horses and fostering cats. Denies safety concerns. Progress with goal.   Suicidal/Homicidal: Nowithout intent/plan  Therapist Response: Therapist engaged Frances [name] in tele-therapy session. Completed check in and reviewed current level of functioning, sxs management and level of stressors. Provided supportive encouragement validated feelings. Engaged Frances [name] in exploring ability to put in place boundaries with friend and not enabling friends behavior of not respecting her boundaries. Explored ability to managing moods and use of coping skills. Reviewed session and provided follow up.   Plan: Return again in  x 4 weeks.  Diagnosis: Bipolar I disorder (HCC)  Borderline personality disorder (HCC)  Collaboration of Care: Other None  Patient/Guardian was advised Release of Information must be obtained prior to any record release in order to collaborate their care with an outside provider. Patient/Guardian was advised if they have not already done so to contact the registration department to sign all necessary forms in order for [place] to release information regarding their care.   Consent: Patient/Guardian gives verbal consent for treatment and assignment of benefits for services provided during this visit. Patient/Guardian expressed understanding and agreed to proceed.   Dorris Singh, [facility]  03/14/2024  

## 2024-03-14 NOTE — Progress Notes (Signed)
 Keflex should treat UTI. If not improving or symptoms persistent let me know.

## 2024-03-14 NOTE — ED Provider Notes (Addendum)
 Frances [name] CARE    CSN: [id number] Arrival date & time: 03/14/24  1202      History   Chief Complaint Chief Complaint  Patient presents with   Back Pain    HPI Frances [name] is a 48 y.o. female.   HPI  [name] has fibromyalgia and chronic low back pain.  She is under the care of her primary care physician for this.  She has hydrocodone that she can take as needed.  Also Voltaren gel and Voltaren tablets.  Patient states that currently her back pain is worse than usual.  It is in the right low back and going down into her right hip.  No pain in the leg, no numbness or weakness.  No recent injury or trauma.  Past Medical History:  Diagnosis Date   Anxiety    Arthritis    Back pain, chronic    Bipolar 1 disorder (HCC)    Chronic fatigue syndrome    DDD (degenerative disc disease), lumbar    Depression    Diabetes mellitus without complication (HCC)    Fibromyalgia    IUD 2012   Mirena   MTHFR gene mutation    Rheumatoid arteritis [facility])     Patient Active Problem List   Diagnosis Date Noted   Carpal tunnel syndrome, bilateral 02/25/2024   Pain in left third proximal interphalangeal joint 10/16/2023   Personal history UTI 09/14/2023   Rheumatoid arthritis 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 (HCC) 09/14/2023   Urinary hesitancy 06/30/2023   Urinary frequency 06/16/2023   OAB (overactive bladder) 06/16/2023   Chronic pain syndrome 06/10/2023   Crush injury of right foot 05/25/2023   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   Microalbuminuria 09/09/2022   Night sweats 06/18/2022   Proteinuria 03/17/2022   Chronic right hip pain 01/06/2022   Impingement syndrome, shoulder, right 01/06/2022   ETD (Eustachian tube dysfunction), bilateral 08/23/2021   SOB (shortness of breath) on exertion 08/20/2021   Overweight (BMI 25.0-29.9) 06/18/2021   Attention deficit hyperactivity disorder (ADHD), 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    Hyperlipidemia LDL goal <70 03/18/2021   Diabetes mellitus (HCC) 03/13/2021   Seasonal allergies 12/25/2020   Symptomatic mammary hypertrophy 09/04/2020   Recurrent major depressive disorder, in partial remission (HCC) 05/24/2020   Generalized anxiety disorder 05/24/2020   DDD (degenerative disc disease), cervical 03/13/2020   Ingrown right big toenail 03/13/2020   Labral tear of right hip joint, degenerative 01/11/2020   Grief reaction 11/28/2019   Borderline personality disorder (HCC) 01/22/2018   Seronegative rheumatoid arthritis (HCC) 12/09/2017   New daily persistent headache 09/26/2017   MTHFR gene mutation 09/20/2017   Bipolar I disorder (HCC) 09/16/2017   Chronic fatigue syndrome 09/16/2017   Back pain 10/13/2008   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   Lumbar spondylosis 02/10/2008   FIBROMYALGIA 02/10/2008   Other specified abnormal findings of blood chemistry 02/10/2008    Past Surgical History:  Procedure Laterality Date   BREAST REDUCTION SURGERY Bilateral 01/07/2024   Procedure: Bilateral breast reduction with liposuction;  Surgeon: Peggye Form, DO;  Location: [facility];  Service: Plastics;  Laterality: Bilateral;   COLONOSCOPY WITH PROPOFOL N/A 02/25/2017   Procedure: COLONOSCOPY WITH PROPOFOL;  Surgeon: Willis Modena, MD;  Location: [facility];  Service: Endoscopy;  Laterality: N/A;   left knee lateral release  1993   scar tisue removal  03/2005   left ankle   TONSILLECTOMY  age 50's    OB History  Gravida  0   Para  0   Term  0   Preterm  0   AB  0   Living  0      SAB  0   IAB  0   Ectopic  0   Multiple  0   Live Births               Home Medications    Prior to Admission medications   Medication Sig Start Date End Date Taking? Authorizing Provider  albuterol (VENTOLIN HFA) 108 (90 Base) MCG/ACT inhaler Take 2 puffs 15 minutes before exercise and as needed for shortness of breath. 08/27/21   Breeback,  Jade L, PA-C  ARIPiprazole (ABILIFY) 15 MG tablet Take 1 tablet (15 mg total) by mouth daily. 01/21/24   Shanna Cisco, NP  atorvastatin (LIPITOR) 20 MG tablet Take 1 tablet (20 mg total) by mouth daily. 02/17/24   Breeback, Lesly Rubenstein L, PA-C  Azelastine HCl 137 MCG/SPRAY SOLN USE 2 SPRAYS ALTERNATE NOSTRILS TWICE A DAY 11/02/23   Breeback, Jade L, PA-C  baclofen (LIORESAL) 10 MG tablet TAKE 1 TABLET BY MOUTH TWICE A DAY AS NEEDED FOR MUSCLE SPASMS 11/02/23   Breeback, Jade L, PA-C  blood glucose meter kit and supplies KIT Dispense based on patient and insurance preference. Use up to four times daily as directed. 05/29/21   Breeback, Jade L, PA-C  buPROPion (WELLBUTRIN XL) 300 MG 24 hr tablet Take 1 tablet (300 mg total) by mouth daily. 01/21/24   Shanna Cisco, NP  cephALEXin (KEFLEX) 500 MG capsule Take 1 capsule (500 mg total) by mouth 2 (two) times daily. 03/10/24   Breeback, Lonna Cobb, PA-C  cholecalciferol (VITAMIN D3) 25 MCG (1000 UT) tablet Take 1,000 Units by mouth daily.    [provider]  diclofenac (VOLTAREN) 75 MG EC tablet Take 1 tablet (75 mg total) by mouth 2 (two) times daily. 10/19/22   Eustace Moore, MD  diclofenac Sodium (VOLTAREN) 1 % GEL Apply 2 g topically 4 (four) times daily. To affected joint. 10/16/23   Monica Becton, MD  DULoxetine HCl 30 MG CSDR Take 90 mg by mouth daily.    [provider]  fluticasone (FLONASE) 50 MCG/ACT nasal spray 2 SPRAYS IN EACH NOSTRIL DAILY 03/01/24   Breeback, Jade L, PA-C  HYDROcodone-acetaminophen (NORCO/VICODIN) 5-325 MG tablet Take 1 tablet by mouth 2 (two) times daily as needed for moderate pain (pain score 4-6) (back pain). 03/07/24   Breeback, Lonna Cobb, PA-C  hydroxychloroquine (PLAQUENIL) 200 MG tablet Take 200 mg by mouth 2 (two) times daily. 200mg  twice daily on weekdays and 200mg  once daily on weekends as of 12/03/22 review    [provider]  levocetirizine (XYZAL) 5 MG tablet TAKE 1 TABLET BY MOUTH  EVERY EVENING 07/24/23   Breeback, Jade L, PA-C  metFORMIN (GLUCOPHAGE) 1000 MG tablet Take 1 tablet (1,000 mg total) by mouth 2 (two) times daily with a meal. 03/08/24   Breeback, Jade L, PA-C  montelukast (SINGULAIR) 10 MG tablet Take 1 tablet (10 mg total) by mouth at bedtime. 07/13/23   Jomarie Longs, PA-C  Multiple Vitamin (MULTIVITAMIN WITH MINERALS) TABS tablet Take 1 tablet by mouth daily.    [provider]  OZEMPIC, 2 MG/DOSE, 8 MG/3ML SOPN INJECT 2 MG AS DIRECTED ONCE A WEEK. 12/01/23   Breeback, Jade L, PA-C  pregabalin (LYRICA) 200 MG capsule TAKE 1 CAPSULE BY MOUTH 2-3 TIMES DAILY 02/25/24  Monica Becton, MD  Semaglutide,0.25 or 0.5MG /DOS, (OZEMPIC, 0.25 OR 0.5 MG/DOSE,) 2 MG/1.5ML SOPN Inject 0.25 mg subq qwk for 4 wk then inject 0.5 mg subq qwk x4 wk, then go to full dose pen qwk 01/28/24   Monica Becton, MD  solifenacin (VESICARE) 10 MG tablet Take 1 tablet (10 mg total) by mouth daily. 08/13/23   Stoneking, Danford Bad., MD    Family History Family History  Problem Relation Age of Onset   Diabetes Mother    Stroke Mother    Lupus Mother    Hypertension Mother    Congestive Heart Failure Mother    Mental illness Brother        Not clear what his diagnosis is--may be related to previous drug use.   Cancer Maternal Grandmother        colon   Diabetes Maternal Grandfather    Depression Maternal Uncle    Alcohol abuse Maternal Uncle     Social History Social History   Tobacco Use   Smoking status: Never   Smokeless tobacco: Never  Vaping Use   Vaping status: Never Used  Substance Use Topics   Alcohol use: No    Alcohol/week: 0.0 standard drinks of alcohol   Drug use: No     Allergies   Metaxalone   Review of Systems Review of Systems See HPI  Physical Exam Triage Vital Signs ED Triage Vitals  Encounter Vitals Group     BP 03/14/24 1217 119/83     Systolic BP Percentile --      Diastolic BP Percentile --      Pulse Rate 03/14/24  1217 99     Resp 03/14/24 1217 16     Temp 03/14/24 1217 98.2 F (36.8 C)     Temp src --      SpO2 03/14/24 1217 98 %     Weight --      Height --      Head Circumference --      Peak Flow --      Pain Score 03/14/24 1220 5     Pain Loc --      Pain Education --      Exclude from Growth Chart --    No data found.  Updated Vital Signs BP 119/83   Pulse 99   Temp 98.2 F (36.8 C)   Resp 16   SpO2 98%       Physical Exam Constitutional:      General: She is not in acute distress.    Appearance: She is well-developed. She is ill-appearing.     Comments: Patient appears uncomfortable.  Sits on exam table grasping the edges of the table white knuckle  HENT:     Head: Normocephalic and atraumatic.  Eyes:     Conjunctiva/sclera: Conjunctivae normal.     Pupils: Pupils are equal, round, and reactive to light.  Cardiovascular:     Rate and Rhythm: Normal rate.  Pulmonary:     Effort: Pulmonary effort is normal. No respiratory distress.  Abdominal:     General: There is no distension.     Palpations: Abdomen is soft.  Musculoskeletal:        General: No swelling or tenderness. Normal range of motion.     Cervical back: Normal range of motion.     Left lower leg: No edema.  Skin:    General: Skin is warm and dry.  Neurological:     General: No focal  deficit present.     Mental Status: She is alert.      UC Treatments / Results  Labs (all labs ordered are listed, but only abnormal results are displayed) Labs Reviewed - No data to display  EKG   Radiology No results found.  Procedures Procedures (including critical care time)  Medications Ordered in UC Medications  ketorolac (TORADOL) 30 MG/ML injection 30 mg (30 mg Intramuscular Given 03/14/24 1245)  methylPREDNISolone acetate (DEPO-MEDROL) injection 80 mg (80 mg Intramuscular Given 03/14/24 1245)    Initial Impression / Assessment and Plan / UC Course  I have reviewed the triage vital signs and the  nursing notes.  Pertinent labs & imaging results that were available during my care of the patient were reviewed by me and considered in my medical decision making (see chart for details).     No focal neurofindings.  Negative straight leg raise.  Patient states she improved with a shot of Toradol.  Will also give her Depo-Medrol for the inflammation.  Follow-up with her PCP Final Clinical Impressions(s) / UC Diagnoses   Final diagnoses:  Chronic bilateral low back pain without sciatica  Acute back pain with sciatica, right     Discharge Instructions      Reduce activity while back is painful May use ice or heat to area The steroid shot will help for several days See your doctor if not improving by the end of the week    ED Prescriptions   None    I have reviewed the PDMP during this encounter.   Eustace Moore, MD 03/14/24 1324    Eustace Moore, MD 03/14/24 1325

## 2024-03-14 NOTE — ED Triage Notes (Signed)
 Has chronic back pain but feels a pinching on right side of back. Yesterday was the left side that she felt the pinching pain in. Has taken hydrocodone - 1 and a half tablets and it is not helping.

## 2024-03-15 ENCOUNTER — Telehealth: Payer: Self-pay

## 2024-03-15 NOTE — Telephone Encounter (Signed)
 Copied from CRM [id number]. Topic: General - Other >> Mar 15, 2024  1:25 PM DeAngela L wrote: Reason for CRM: Patient is on anti biotics for a UTI and today she feels like she has to go to the restroom all the time but doesn't really have to go when she goes please call back [phone]

## 2024-03-15 NOTE — Telephone Encounter (Signed)
 Please review the previous note. Called the patient, she stated she doesn't think the current antibiotic given (Keflex) is working.

## 2024-03-16 ENCOUNTER — Encounter: Payer: Self-pay | Admitting: Physician Assistant

## 2024-03-16 ENCOUNTER — Ambulatory Visit: Payer: MEDICAID | Admitting: Physical Therapy

## 2024-03-16 ENCOUNTER — Encounter: Payer: Self-pay | Admitting: Physical Therapy

## 2024-03-16 DIAGNOSIS — M25511 Pain in right shoulder: Secondary | ICD-10-CM

## 2024-03-16 DIAGNOSIS — M6281 Muscle weakness (generalized): Secondary | ICD-10-CM

## 2024-03-16 MED ORDER — NITROFURANTOIN MONOHYD MACRO 100 MG PO CAPS: 100.0000 mg | ORAL_CAPSULE | Freq: Two times a day (BID) | ORAL | 0 refills | Status: DC
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
## 2024-03-18 ENCOUNTER — Telehealth: Payer: Self-pay | Admitting: Physician Assistant

## 2024-03-18 ENCOUNTER — Ambulatory Visit: Payer: MEDICAID | Admitting: Sports Medicine

## 2024-03-18 DIAGNOSIS — Z719 Counseling, unspecified: Secondary | ICD-10-CM

## 2024-03-18 MED ORDER — AMOXICILLIN 875 MG PO TABS: 875.0000 mg | ORAL_TABLET | Freq: Two times a day (BID) | ORAL | 0 refills | Status: DC

## 2024-03-18 MED ORDER — DEXAMETHASONE 4 MG PO TABS: 4.0000 mg | ORAL_TABLET | Freq: Two times a day (BID) | ORAL | 0 refills | Status: DC

## 2024-03-18 NOTE — Telephone Encounter (Signed)
 Copied from CRM [id number]. Topic: General - Other >> Mar 18, 2024 11:17 AM Emylou G wrote: Reason for CRM: Tooth pooled yesterday.. dentist is not medicaid approved - his meds will not be covered.. can we prescribe the medication?  Amoxicillin and  Dexamethasone .[name] Her number [phone]

## 2024-03-21 ENCOUNTER — Other Ambulatory Visit (HOSPITAL_COMMUNITY): Payer: Self-pay

## 2024-03-21 ENCOUNTER — Telehealth: Payer: Self-pay | Admitting: Pharmacy Technician

## 2024-03-21 ENCOUNTER — Ambulatory Visit: Payer: MEDICAID | Admitting: Physician Assistant

## 2024-03-21 VITALS — BP 128/84 | HR 99 | Ht 67.0 in | Wt 184.0 lb

## 2024-03-21 DIAGNOSIS — G894 Chronic pain syndrome: Secondary | ICD-10-CM | POA: Diagnosis not present

## 2024-03-21 DIAGNOSIS — M47816 Spondylosis without myelopathy or radiculopathy, lumbar region: Secondary | ICD-10-CM | POA: Diagnosis not present

## 2024-03-21 DIAGNOSIS — M545 Low back pain, unspecified: Secondary | ICD-10-CM

## 2024-03-21 MED ORDER — TIZANIDINE HCL 4 MG PO CAPS: 4.0000 mg | ORAL_CAPSULE | Freq: Three times a day (TID) | ORAL | 0 refills | Status: DC

## 2024-03-21 NOTE — Progress Notes (Unsigned)
   Acute Office Visit  Subjective:     Patient ID: Frances [name], female    DOB: 1976-12-11, 47 y.o.   MRN: [id number]  No chief complaint on file.   HPI Patient is in today for ***  Toradol depomedrol 3/24  Hydrocodone up to twice a day   ROS      Objective:    There were no vitals taken for this visit. {Vitals History (Optional):23777}  Physical Exam  No results found for any visits on 03/21/24.      Assessment & Plan:   Problem List Items Addressed This Visit   None   No orders of the defined types were placed in this encounter.   No follow-ups on file.  Tandy Gaw, PA-C

## 2024-03-21 NOTE — Patient Instructions (Addendum)
 Follow up with Dr. Karie Schwalbe Tizanidine to replace baclofen Tens unit heating pad and low back stretches

## 2024-03-21 NOTE — Telephone Encounter (Signed)
 Pharmacy Patient Advocate Encounter   Received notification from Onbase that prior authorization for TIZANIDINE 4MG  CAPSULES is required/requested.   Insurance verification completed.   The patient is insured through [employer] [place] .   Per test claim:  TIZANIDINE 4MG  TABLETS is preferred by the insurance.  If suggested medication is appropriate, Please send in a new RX and discontinue this one. If not, please advise as to why it's not appropriate so that we may request a Prior Authorization. Please note, some preferred medications may still require a PA.  If the suggested medications have not been trialed and there are no contraindications to their use, the PA will not be submitted, as it will not be approved.

## 2024-03-22 ENCOUNTER — Encounter: Payer: Self-pay | Admitting: Physician Assistant

## 2024-03-22 ENCOUNTER — Other Ambulatory Visit (HOSPITAL_COMMUNITY): Payer: Self-pay

## 2024-03-22 MED ORDER — TIZANIDINE HCL 4 MG PO TABS: 4.0000 mg | ORAL_TABLET | Freq: Three times a day (TID) | ORAL | 0 refills | Status: DC

## 2024-03-22 MED ORDER — TIZANIDINE HCL 4 MG PO CAPS: 4.0000 mg | ORAL_CAPSULE | Freq: Three times a day (TID) | ORAL | Status: DC

## 2024-03-22 NOTE — Addendum Note (Signed)
 Addended by: Elizabeth Palau on: 03/22/2024 11:05 AM   Modules accepted: Orders

## 2024-03-22 NOTE — Telephone Encounter (Signed)
 Changed to tizanidine tablets and resent to pharmacy .

## 2024-03-23 ENCOUNTER — Ambulatory Visit: Payer: MEDICAID | Attending: Sports Medicine

## 2024-03-23 ENCOUNTER — Telehealth: Payer: Self-pay

## 2024-03-23 ENCOUNTER — Other Ambulatory Visit (INDEPENDENT_AMBULATORY_CARE_PROVIDER_SITE_OTHER): Payer: MEDICAID

## 2024-03-23 ENCOUNTER — Ambulatory Visit: Payer: MEDICAID | Admitting: Sports Medicine

## 2024-03-23 ENCOUNTER — Encounter: Payer: Self-pay | Admitting: Sports Medicine

## 2024-03-23 DIAGNOSIS — M545 Low back pain, unspecified: Secondary | ICD-10-CM | POA: Diagnosis present

## 2024-03-23 DIAGNOSIS — R252 Cramp and spasm: Secondary | ICD-10-CM | POA: Diagnosis present

## 2024-03-23 DIAGNOSIS — M25511 Pain in right shoulder: Secondary | ICD-10-CM | POA: Insufficient documentation

## 2024-03-23 DIAGNOSIS — M7541 Impingement syndrome of right shoulder: Secondary | ICD-10-CM

## 2024-03-23 DIAGNOSIS — M542 Cervicalgia: Secondary | ICD-10-CM | POA: Insufficient documentation

## 2024-03-23 DIAGNOSIS — M5416 Radiculopathy, lumbar region: Secondary | ICD-10-CM | POA: Diagnosis present

## 2024-03-23 DIAGNOSIS — G8929 Other chronic pain: Secondary | ICD-10-CM | POA: Diagnosis present

## 2024-03-23 DIAGNOSIS — M47816 Spondylosis without myelopathy or radiculopathy, lumbar region: Secondary | ICD-10-CM

## 2024-03-23 DIAGNOSIS — R2681 Unsteadiness on feet: Secondary | ICD-10-CM | POA: Diagnosis present

## 2024-03-23 DIAGNOSIS — M6281 Muscle weakness (generalized): Secondary | ICD-10-CM | POA: Insufficient documentation

## 2024-03-23 DIAGNOSIS — M25551 Pain in right hip: Secondary | ICD-10-CM | POA: Diagnosis present

## 2024-03-23 DIAGNOSIS — G894 Chronic pain syndrome: Secondary | ICD-10-CM | POA: Diagnosis not present

## 2024-03-23 DIAGNOSIS — R2689 Other abnormalities of gait and mobility: Secondary | ICD-10-CM | POA: Insufficient documentation

## 2024-03-23 DIAGNOSIS — R293 Abnormal posture: Secondary | ICD-10-CM | POA: Insufficient documentation

## 2024-03-23 MED ORDER — KETOROLAC TROMETHAMINE 60 MG/2ML IM SOLN
60.0000 mg | Freq: Once | INTRAMUSCULAR | Status: AC
  Administered 2024-03-23: 60 mg via INTRAMUSCULAR

## 2024-03-23 MED ORDER — TRIAMCINOLONE ACETONIDE 40 MG/ML IJ SUSP
40.0000 mg | Freq: Once | INTRAMUSCULAR | Status: AC
  Administered 2024-03-23: 40 mg via INTRAMUSCULAR

## 2024-03-23 NOTE — Assessment & Plan Note (Addendum)
 Frances [name] returns, she is a pleasant 48 year old female, multilevel lumbar spinal stenosis moderate at L3-L4, she is at multiple injection including epidurals, facet injections as well as medial branch blocks and radiofrequency ablation, Lyrica  200 mg 2-3 times daily provided some relief. At this point we will proceed with the epidural, return to see me 6 weeks after. Of note she does need an updated MRI.  Update 04/18/2024: Patient has failed over 6 weeks of physician directed conservative treatment and formal physical therapy, she has chronic longitudinal low back pain that has failed medication, she has had epidurals in the past that have been effective, she is due for another epidural due to failure of conservative treatment but we need a new updated MRI before we can order the injection.  Update, MRI continued to be denied, we do need to resubmit with additional information, the patient has performed all of the above, specifically the McKenzie method back exercises with core stabilization, strengthening, planks, stretching, she has done this 2-3 times daily every other day of the week for over 6 weeks within the last 6 months, she has not sufficiently improved.  Epidurals have been helpful in the past and we do need an updated MRI before proceeding with this subsequent epidural.

## 2024-03-23 NOTE — Progress Notes (Addendum)
    Procedures performed today:    Procedure: Real-time Ultrasound Guided injection of the right glenohumeral joint Device: Samsung HS60  Verbal informed consent obtained.  Time-out conducted.  Noted no overlying erythema, induration, or other signs of local infection.  Skin prepped in a sterile fashion.  Local anesthesia: Topical Ethyl chloride.  With sterile technique and under real time ultrasound guidance: 1 cc Kenalog  40, 2 cc lidocaine , 2 cc bupivacaine  injected easily Completed without difficulty  Advised to call if fevers/chills, erythema, induration, drainage, or persistent bleeding.  Images permanently stored and available for review in PACS.  Impression: Technically successful ultrasound guided injection.  Independent interpretation of notes and tests performed by another provider:   None.  Brief History, Exam, Impression, and Recommendations:    Lumbar spondylosis [name] returns, she is a pleasant 48 year old female, multilevel lumbar spinal stenosis moderate at L3-L4, she is at multiple injection including epidurals, facet injections as well as medial branch blocks and radiofrequency ablation, Lyrica  200 mg 2-3 times daily provided some relief. At this point we will proceed with the epidural, return to see me 6 weeks after. Of note she does need an updated MRI.  Update 04/18/2024: Patient has failed over 6 weeks of physician directed conservative treatment and formal physical therapy, she has chronic longitudinal low back pain that has failed medication, she has had epidurals in the past that have been effective, she is due for another epidural due to failure of conservative treatment but we need a new updated MRI before we can order the injection.  Update, MRI continued to be denied, we do need to resubmit with additional information, the patient has performed all of the above, specifically the McKenzie method back exercises with core stabilization, strengthening, planks,  stretching, she has done this 2-3 times daily every other day of the week for over 6 weeks within the last 6 months, she has not sufficiently improved.  Epidurals have been helpful in the past and we do need an updated MRI before proceeding with this subsequent epidural.  Impingement syndrome, shoulder, right Known glenohumeral arthritis and subacromial bursitis on MRI, back in April of last year we did glenohumeral and subacromial injections and she did well, at the last visit she was only having impingement signs so we did a subacromial injection, she has had partial relief, we will go ahead and do the glenohumeral joint injection today, return to see me in 6 weeks.    ____________________________________________ Joselyn Nicely. Sandy Crumb, M.D., ABFM., CAQSM., AME. Primary Care and Sports Medicine [facility]  Adjunct Professor of [facility] Medicine  [employer]  Restaurant manager, fast food

## 2024-03-23 NOTE — Addendum Note (Signed)
 Addended by: Ernest Mallick A on: 03/23/2024 07:06 AM   Modules accepted: Orders

## 2024-03-23 NOTE — Telephone Encounter (Signed)
 Patient left voicemail c/o pain at her incision sites. Patient had a BL breast reduction January 2025. Her primary doctor suggested it was her diaphragm but she wanted to know if a plastics provider thought that it was related to her having surgery.   Will someone please call patient and have her scheduled to come in for a f/u to be evaluated by PA?   Thanks!
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
## 2024-03-23 NOTE — Addendum Note (Signed)
 Addended by: Samule Dry on: 03/23/2024 11:44 AM   Modules accepted: Orders

## 2024-03-23 NOTE — Assessment & Plan Note (Signed)
 Known glenohumeral arthritis and subacromial bursitis on MRI, back in April of last year we did glenohumeral and subacromial injections and she did well, at the last visit she was only having impingement signs so we did a subacromial injection, she has had partial relief, we will go ahead and do the glenohumeral joint injection today, return to see me in 6 weeks.

## 2024-03-25 ENCOUNTER — Ambulatory Visit (INDEPENDENT_AMBULATORY_CARE_PROVIDER_SITE_OTHER): Payer: MEDICAID | Admitting: Surgical

## 2024-03-25 ENCOUNTER — Encounter: Payer: Self-pay | Admitting: Surgical

## 2024-03-25 VITALS — BP 113/79 | HR 97

## 2024-03-25 DIAGNOSIS — Z9889 Other specified postprocedural states: Secondary | ICD-10-CM

## 2024-03-25 NOTE — Progress Notes (Signed)
 48 year old female here for follow-up after bilateral breast reduction on 01/07/2024.  She is 11 weeks postop.    She presents today for evaluation due to noticing pain in the right inframammary fold incision.  She reports she saw her PCP, PCP felt as if it may have been related to her diaphragm.  Patient reports that it does not seem to be worse with activity, she is not having any shortness of breath or chest pain.  She did report that it was better after wearing a bra yesterday.  Chaperone present on exam Patient is well-developed, well-nourished, no acute distress.  Breathing is unlabored. On exam bilateral NAC's are viable, breast incisions are intact and well-healed.  There is no erythema or cellulitic changes noted of either breast.  No subcutaneous fluid collection noted with palpation.  Incisions do not appear to be hypertrophic or thickened.  BP 113/79 (BP Location: Left Arm, Patient Position: Sitting, Cuff Size: Normal)   Pulse 97   SpO2 98%    A/P:  Patient has healed very well from her breast reduction.  There is no signs of infection or concern on exam.  Discussed with patient unsure of etiology/cause of her discomfort in the right inframammary fold.  She did have some discomfort with palpation.  I do not believe it is cardiac or pulmonary in nature, it is not worse with activities.  It did seem to improve after wearing a bra yesterday.  Discussed with patient it may be some postsurgical pain along the incision or some nerve pain due to surgical recovery.  Discussed with patient if it was this, would recommend massage to the scars to help with desensitization and softening of the scars.  Patient's vital signs are stable today.  Discussed with patient if symptoms continue or do not improve, may need to return to PCP for further evaluation.  Patient's questions were answered to her content.  Follow-up as needed.

## 2024-03-29 ENCOUNTER — Ambulatory Visit: Payer: MEDICAID | Admitting: Sports Medicine

## 2024-03-30 ENCOUNTER — Ambulatory Visit: Payer: MEDICAID | Admitting: Physical Therapy

## 2024-03-30 ENCOUNTER — Encounter: Payer: Self-pay | Admitting: Physical Therapy

## 2024-03-30 DIAGNOSIS — M25511 Pain in right shoulder: Secondary | ICD-10-CM | POA: Diagnosis not present

## 2024-03-30 DIAGNOSIS — M6281 Muscle weakness (generalized): Secondary | ICD-10-CM
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
## 2024-03-31 ENCOUNTER — Encounter: Payer: Self-pay | Admitting: Physician Assistant

## 2024-03-31 ENCOUNTER — Other Ambulatory Visit: Payer: Self-pay | Admitting: Physician Assistant

## 2024-03-31 MED ORDER — SEMAGLUTIDE (1 MG/DOSE) 4 MG/3ML ~~LOC~~ SOPN: 1.0000 mg | PEN_INJECTOR | SUBCUTANEOUS | 0 refills | Status: DC

## 2024-04-01 ENCOUNTER — Other Ambulatory Visit: Payer: Self-pay | Admitting: Physician Assistant

## 2024-04-01 DIAGNOSIS — J302 Other seasonal allergic rhinitis: Secondary | ICD-10-CM

## 2024-04-01 DIAGNOSIS — J3489 Other specified disorders of nose and nasal sinuses: Secondary | ICD-10-CM

## 2024-04-01 DIAGNOSIS — R0989 Other specified symptoms and signs involving the circulatory and respiratory systems: Secondary | ICD-10-CM

## 2024-04-04 ENCOUNTER — Other Ambulatory Visit: Payer: Self-pay | Admitting: Physician Assistant

## 2024-04-04 DIAGNOSIS — R0989 Other specified symptoms and signs involving the circulatory and respiratory systems: Secondary | ICD-10-CM

## 2024-04-04 DIAGNOSIS — J3489 Other specified disorders of nose and nasal sinuses: Secondary | ICD-10-CM

## 2024-04-04 DIAGNOSIS — J310 Chronic rhinitis: Secondary | ICD-10-CM

## 2024-04-06 ENCOUNTER — Ambulatory Visit: Payer: MEDICAID | Admitting: Physical Therapy

## 2024-04-06 ENCOUNTER — Encounter: Payer: Self-pay | Admitting: Physical Therapy

## 2024-04-06 DIAGNOSIS — M25511 Pain in right shoulder: Secondary | ICD-10-CM | POA: Diagnosis not present

## 2024-04-06 DIAGNOSIS — M6281 Muscle weakness (generalized): Secondary | ICD-10-CM
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
## 2024-04-07 ENCOUNTER — Telehealth: Payer: Self-pay | Admitting: Physician Assistant

## 2024-04-07 NOTE — Telephone Encounter (Signed)
 Copied from CRM [id number]. Topic: Referral - Prior Authorization Question >> Apr 07, 2024 11:27 AM Blair Bumpers wrote: Reason for CRM: Patient called stating that she was suppose to have a referral placed to have her back injected. States they are now telling her that the provider's office has to contact patient's insurance to get an prior auth before she can have the injection. Please give the patient a call back to further advise. CB #: [phone].

## 2024-04-07 NOTE — Telephone Encounter (Signed)
 Patient states medication is for her back injections. Orders have been placed to [facility]. The office will contact patients insurance to obtain PA before scheduling referral appointment.

## 2024-04-11 ENCOUNTER — Other Ambulatory Visit: Payer: Self-pay | Admitting: Physician Assistant

## 2024-04-11 ENCOUNTER — Encounter: Payer: Self-pay | Admitting: Emergency Medicine

## 2024-04-11 ENCOUNTER — Ambulatory Visit
Admission: EM | Admit: 2024-04-11 | Discharge: 2024-04-11 | Disposition: A | Discharge status: Home / Self Care | Payer: MEDICAID | Attending: Family Medicine | Admitting: Family Medicine

## 2024-04-11 DIAGNOSIS — G894 Chronic pain syndrome: Secondary | ICD-10-CM | POA: Diagnosis not present

## 2024-04-11 DIAGNOSIS — M0609 Rheumatoid arthritis without rheumatoid factor, multiple sites: Secondary | ICD-10-CM

## 2024-04-11 DIAGNOSIS — M51369 Other intervertebral disc degeneration, lumbar region without mention of lumbar back pain or lower extremity pain: Secondary | ICD-10-CM

## 2024-04-11 DIAGNOSIS — G8929 Other chronic pain: Secondary | ICD-10-CM

## 2024-04-11 DIAGNOSIS — M47816 Spondylosis without myelopathy or radiculopathy, lumbar region: Secondary | ICD-10-CM | POA: Diagnosis not present

## 2024-04-11 MED ORDER — PREDNISONE 10 MG (21) PO TBPK: ORAL_TABLET | Freq: Every day | ORAL | 0 refills | Status: DC

## 2024-04-11 MED ORDER — KETOROLAC TROMETHAMINE 30 MG/ML IJ SOLN
30.0000 mg | Freq: Once | INTRAMUSCULAR | Status: AC
  Administered 2024-04-11: 30 mg via INTRAMUSCULAR

## 2024-04-11 NOTE — ED Triage Notes (Signed)
 Patient here for recurrent back pain.  Pain is in her lower back and radiates down leg.  Patient has taken Vicodin today.

## 2024-04-11 NOTE — Discharge Instructions (Signed)
 Take the prednisone  pack as directed.  You can take all of day 1 today Continue under the care of your primary care and sports medicine specialists I hope you feel better soon

## 2024-04-11 NOTE — Telephone Encounter (Unsigned)
 Copied from CRM [id number]. Topic: Clinical - Medication Refill >> Apr 11, 2024  2:44 PM Maryln Sober wrote: Most Recent Primary Care Visit:  Provider: Gean Keels  Department: [facility]  Visit Type: OFFICE VISIT  Date: 03/23/2024  Medication: HYDROcodone -acetaminophen  (NORCO/VICODIN) 5-325 MG tablet  Has the patient contacted their pharmacy? Yes (Agent: If no, request that the patient contact the pharmacy for the refill. If patient does not wish to contact the pharmacy document the reason why and proceed with request.) (Agent: If yes, when and what did the pharmacy advise?)  Is this the correct pharmacy for this prescription? Yes If no, delete pharmacy and type the correct one.  This is the patient's preferred pharmacy:  [facility], [place] - [address] [place] [address] Phone: [phone] Fax: [phone]  Has the prescription been filled recently? No  Is the patient out of the medication? Yes  Has the patient been seen for an appointment in the last year OR does the patient have an upcoming appointment? Yes  Can we respond through MyChart? Yes  Agent: Please be advised that Rx refills may take up to 3 business days. We ask that you follow-up with your pharmacy.

## 2024-04-11 NOTE — ED Provider Notes (Signed)
 [name]    CSN: [id number] Arrival date & time: 04/11/24  1344      History   Chief Complaint Chief Complaint  Patient presents with   Back Pain    HPI Frances [name] is a 48 y.o. female.   Been having increased difficulty with her back.  She has low back pain and sciatica.  She has seen her primary care doctor.  She has gone to physical therapy.  She has seen a sports medicine doctor.  She is scheduled for a lumbar epidural steroid.  She is scheduled for another MRI.  She got up today and is having increased pain.  When I saw her last time I gave her a shot of Toradol  and this was helpful for her.  She states that steroids do help the burning pain that goes into her leg.  She has not had any since I saw her last.  She does have pain medicine to take if needed.  She is compliant with her medical recommendations.     Past Medical History:  Diagnosis Date   Anxiety    Arthritis    Back pain, chronic    Bipolar 1 disorder (HCC)    Chronic fatigue syndrome    DDD (degenerative disc disease), lumbar    Depression    Diabetes mellitus without complication (HCC)    Fibromyalgia    IUD 2012   Mirena   MTHFR gene mutation    Rheumatoid arteritis [facility])     Patient Active Problem List   Diagnosis Date Noted   Carpal tunnel syndrome, bilateral 02/25/2024   Pain in left third proximal interphalangeal joint 10/16/2023   Personal history UTI 09/14/2023   Rheumatoid arthritis 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 (HCC) 09/14/2023   Urinary hesitancy 06/30/2023   Urinary frequency 06/16/2023   OAB (overactive bladder) 06/16/2023   Chronic pain syndrome 06/10/2023   Crush injury of right foot 05/25/2023   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   Microalbuminuria 09/09/2022   Night sweats 06/18/2022   Proteinuria 03/17/2022   Chronic right hip pain 01/06/2022   Impingement syndrome, shoulder, right 01/06/2022   ETD (Eustachian tube dysfunction),  bilateral 08/23/2021   SOB (shortness of breath) on exertion 08/20/2021   Overweight (BMI 25.0-29.9) 06/18/2021   Attention deficit hyperactivity disorder (ADHD), 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   Hyperlipidemia LDL goal <70 03/18/2021   Diabetes mellitus (HCC) 03/13/2021   Seasonal allergies 12/25/2020   Symptomatic mammary hypertrophy 09/04/2020   Recurrent major depressive disorder, in partial remission (HCC) 05/24/2020   Generalized anxiety disorder 05/24/2020   DDD (degenerative disc disease), cervical 03/13/2020   Ingrown right big toenail 03/13/2020   Labral tear of right hip joint, degenerative 01/11/2020   Grief reaction 11/28/2019   Borderline personality disorder (HCC) 01/22/2018   Seronegative rheumatoid arthritis (HCC) 12/09/2017   New daily persistent headache 09/26/2017   MTHFR gene mutation 09/20/2017   Bipolar I disorder (HCC) 09/16/2017   Chronic fatigue syndrome 09/16/2017   Back pain 10/13/2008   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   Lumbar spondylosis 02/10/2008   FIBROMYALGIA 02/10/2008   Other specified abnormal findings of blood chemistry 02/10/2008    Past Surgical History:  Procedure Laterality Date   BREAST REDUCTION SURGERY Bilateral 01/07/2024   Procedure: Bilateral breast reduction with liposuction;  Surgeon: Thornell Flirt, DO;  Location: [facility];  Service: Plastics;  Laterality: Bilateral;   COLONOSCOPY WITH PROPOFOL  N/A 02/25/2017   Procedure: COLONOSCOPY WITH  PROPOFOL ;  Surgeon: Evangeline Hilts, MD;  Location: [facility];  Service: Endoscopy;  Laterality: N/A;   left knee lateral release  1993   scar tisue removal  03/2005   left ankle   TONSILLECTOMY  age 57's    OB History     Gravida  0   Para  0   Term  0   Preterm  0   AB  0   Living  0      SAB  0   IAB  0   Ectopic  0   Multiple  0   Live Births               Home Medications    Prior to Admission medications   Medication  Sig Start Date End Date Taking? Authorizing Provider  albuterol  (VENTOLIN  HFA) 108 (90 Base) MCG/ACT inhaler Take 2 puffs 15 minutes before exercise and as needed for shortness of breath. 08/27/21  Yes Breeback, Jade L, PA-C  ARIPiprazole  (ABILIFY ) 15 MG tablet Take 1 tablet (15 mg total) by mouth daily. 01/21/24  Yes [name] Koyanagi E, NP  atorvastatin  (LIPITOR) 20 MG tablet Take 1 tablet (20 mg total) by mouth daily. 02/17/24  Yes Breeback, Jade L, PA-C  Azelastine  HCl 137 MCG/SPRAY SOLN 2 SPRAY IN ALTERNATE NOSTRILS TWICE A DAY 04/06/24  Yes Breeback, Jade L, PA-C  baclofen  (LIORESAL ) 10 MG tablet TAKE 1 TABLET BY MOUTH TWICE A DAY AS NEEDED FOR MUSCLE SPASMS 11/02/23  Yes Breeback, Jade L, PA-C  blood glucose meter kit and supplies KIT Dispense based on patient and insurance preference. Use up to four times daily as directed. 05/29/21  Yes Breeback, Jade L, PA-C  buPROPion  (WELLBUTRIN  XL) 300 MG 24 hr tablet Take 1 tablet (300 mg total) by mouth daily. 01/21/24  Yes [name] Koyanagi E, NP  cholecalciferol (VITAMIN D3) 25 MCG (1000 UT) tablet Take 1,000 Units by mouth daily.   Yes [provider]  diclofenac  (VOLTAREN ) 75 MG EC tablet Take 1 tablet (75 mg total) by mouth 2 (two) times daily. 10/19/22  Yes Stephany Ehrich, MD  diclofenac  Sodium (VOLTAREN ) 1 % GEL Apply 2 g topically 4 (four) times daily. To affected joint. 10/16/23  Yes Gean Keels, MD  DULoxetine  HCl 30 MG CSDR Take 90 mg by mouth daily.   Yes [provider]  fluticasone  (FLONASE ) 50 MCG/ACT nasal spray 2 SPRAYS IN EACH NOSTRIL DAILY 03/01/24  Yes Breeback, Jade L, PA-C  HYDROcodone -acetaminophen  (NORCO/VICODIN) 5-325 MG tablet Take 1 tablet by mouth 2 (two) times daily as needed for moderate pain (pain score 4-6) (back pain). 03/07/24  Yes Breeback, Jade L, PA-C  hydroxychloroquine (PLAQUENIL) 200 MG tablet Take 200 mg by mouth 2 (two) times daily. 200mg  twice daily on weekdays and 200mg  once daily on  weekends as of 12/03/22 review   Yes [provider]  levocetirizine (XYZAL ) 5 MG tablet TAKE 1 TABLET BY MOUTH EVERY EVENING 04/01/24  Yes Breeback, Jade L, PA-C  metFORMIN  (GLUCOPHAGE ) 1000 MG tablet Take 1 tablet (1,000 mg total) by mouth 2 (two) times daily with a meal. 03/08/24  Yes Breeback, Jade L, PA-C  montelukast  (SINGULAIR ) 10 MG tablet Take 1 tablet (10 mg total) by mouth at bedtime. 07/13/23  Yes Breeback, Jade L, PA-C  Multiple Vitamin (MULTIVITAMIN WITH MINERALS) TABS tablet Take 1 tablet by mouth daily.   Yes [provider]  OZEMPIC , 2 MG/DOSE, 8 MG/3ML SOPN INJECT 2 MG AS DIRECTED ONCE A  WEEK. 12/01/23  Yes Breeback, Jade L, PA-C  predniSONE  (STERAPRED UNI-PAK 21 TAB) 10 MG (21) TBPK tablet Take by mouth daily. Take 6 tabs by mouth daily  for 2 days, then 5 tabs for 2 days, then 4 tabs for 2 days, then 3 tabs for 2 days, 2 tabs for 2 days, then 1 tab by mouth daily for 2 days 04/11/24  Yes Stephany Ehrich, MD  pregabalin  (LYRICA ) 200 MG capsule TAKE 1 CAPSULE BY MOUTH 2-3 TIMES DAILY 02/25/24  Yes Gean Keels, MD  Semaglutide , 1 MG/DOSE, 4 MG/3ML SOPN Inject 1 mg as directed once a week. 03/31/24  Yes Breeback, Jade L, PA-C  solifenacin  (VESICARE ) 10 MG tablet Take 1 tablet (10 mg total) by mouth daily. 08/13/23  Yes Stoneking, Ponce Brisker., MD  tiZANidine  (ZANAFLEX ) 4 MG capsule Take 1 capsule (4 mg total) by mouth 3 (three) times daily. 03/22/24  Yes Breeback, Elvie Hammed, PA-C    Family History Family History  Problem Relation Age of Onset   Diabetes Mother    Stroke Mother    Lupus Mother    Hypertension Mother    Congestive Heart Failure Mother    Mental illness Brother        Not clear what his diagnosis is--may be related to previous drug use.   Cancer Maternal Grandmother        colon   Diabetes Maternal Grandfather    Depression Maternal Uncle    Alcohol abuse Maternal Uncle     Social History Social History   Tobacco Use   Smoking status:  Never   Smokeless tobacco: Never  Vaping Use   Vaping status: Never Used  Substance Use Topics   Alcohol use: No    Alcohol/week: 0.0 standard drinks of alcohol   Drug use: No     Allergies   Metaxalone   Review of Systems Review of Systems See HPI  Physical Exam Triage Vital Signs ED Triage Vitals [04/11/24 1354]  Encounter Vitals Group     BP 134/87     Systolic BP Percentile      Diastolic BP Percentile      Pulse Rate 99     Resp 16     Temp 98.4 F (36.9 C)     Temp Source Oral     SpO2 97 %     Weight      Height      Head Circumference      Peak Flow      Pain Score 5     Pain Loc      Pain Education      Exclude from Growth Chart    No data found.  Updated Vital Signs BP 134/87 (BP Location: Right Arm)   Pulse 99   Temp 98.4 F (36.9 C) (Oral)   Resp 16   SpO2 97%      Physical Exam Constitutional:      General: She is not in acute distress.    Appearance: She is well-developed. She is ill-appearing.     Comments: Patient is uncomfortable.  Guarded movements.  HENT:     Head: Normocephalic and atraumatic.  Eyes:     Conjunctiva/sclera: Conjunctivae normal.     Pupils: Pupils are equal, round, and reactive to light.  Cardiovascular:     Rate and Rhythm: Normal rate.  Pulmonary:     Effort: Pulmonary effort is normal. No respiratory distress.  Abdominal:     General: There  is no distension.     Palpations: Abdomen is soft.  Musculoskeletal:        General: Normal range of motion.     Cervical back: Normal range of motion.  Skin:    General: Skin is warm and dry.  Neurological:     Mental Status: She is alert.     Sensory: No sensory deficit.     Motor: No weakness.     Gait: Gait abnormal.     Deep Tendon Reflexes: Reflexes normal.     Comments: No numbness or weakness, no new focal neurofindings but patient does have a positive straight leg raise on the right      UC Treatments / Results  Labs (all labs ordered are listed,  but only abnormal results are displayed) Labs Reviewed - No data to display  EKG   Radiology No results found.  Procedures Procedures (including critical care time)  Medications Ordered in UC Medications  ketorolac  (TORADOL ) 30 MG/ML injection 30 mg (30 mg Intramuscular Given 04/11/24 1408)    Initial Impression / Assessment and Plan / UC Course  I have reviewed the triage vital signs and the nursing notes.  Pertinent labs & imaging results that were available during my care of the patient were reviewed by me and considered in my medical decision making (see chart for details).     Final Clinical Impressions(s) / UC Diagnoses   Final diagnoses:  Chronic pain syndrome  Lumbar spondylosis     Discharge Instructions      Take the prednisone  pack as directed.  You can take all of day 1 today Continue under the care of your primary care and sports medicine specialists I hope you feel better soon   ED Prescriptions     Medication Sig Dispense Auth. Provider   predniSONE  (STERAPRED UNI-PAK 21 TAB) 10 MG (21) TBPK tablet Take by mouth daily. Take 6 tabs by mouth daily  for 2 days, then 5 tabs for 2 days, then 4 tabs for 2 days, then 3 tabs for 2 days, 2 tabs for 2 days, then 1 tab by mouth daily for 2 days 42 tablet Nicholette Barley Cleola Dach, MD      PDMP not reviewed this encounter.   Stephany Ehrich, MD 04/11/24 [phone]

## 2024-04-12 NOTE — Telephone Encounter (Signed)
 Per MRI,insurance is requesting more notes.They have been sent.

## 2024-04-13 ENCOUNTER — Telehealth (INDEPENDENT_AMBULATORY_CARE_PROVIDER_SITE_OTHER): Payer: MEDICAID | Admitting: Psychiatry

## 2024-04-13 ENCOUNTER — Encounter: Payer: Self-pay | Admitting: Physician Assistant

## 2024-04-13 ENCOUNTER — Encounter (HOSPITAL_COMMUNITY): Payer: Self-pay | Admitting: Psychiatry

## 2024-04-13 DIAGNOSIS — F900 Attention-deficit hyperactivity disorder, predominantly inattentive type: Secondary | ICD-10-CM | POA: Diagnosis not present

## 2024-04-13 DIAGNOSIS — F319 Bipolar disorder, unspecified: Secondary | ICD-10-CM | POA: Diagnosis not present

## 2024-04-13 MED ORDER — HYDROCODONE-ACETAMINOPHEN 5-325 MG PO TABS: 1.0000 | ORAL_TABLET | Freq: Two times a day (BID) | ORAL | 0 refills | Status: DC | PRN

## 2024-04-13 MED ORDER — BUPROPION HCL ER (XL) 300 MG PO TB24: 300.0000 mg | ORAL_TABLET | Freq: Every day | ORAL | 3 refills | Status: DC

## 2024-04-13 MED ORDER — ARIPIPRAZOLE 15 MG PO TABS: 15.0000 mg | ORAL_TABLET | Freq: Every day | ORAL | 3 refills | Status: DC

## 2024-04-13 NOTE — Progress Notes (Signed)
 BH MD/PA/NP OP Progress Note Virtual Visit via Video Note  I connected with Frances [name] on 04/13/24 at  1:00 PM EDT by a video enabled telemedicine application and verified that I am speaking with the correct person using two identifiers.  Location: Patient: Home Provider: Clinic   I discussed the limitations of evaluation and management by telemedicine and the availability of in person appointments. The patient expressed understanding and agreed to proceed.  I provided 30 minutes of non-face-to-face time during this encounter.              04/13/2024 1:22 PM [name]  MRN:  [id number]  Chief Complaint: "I get irritated with my friend"  HPI: 48 year old female seen today for follow up psychiatric evaluation. She has a psychiatric history of bipolar 1, borderline personality disorder, depression, and anxiety. She is currently managed on Abilify  15 mg nightly and Wellbutrin  300 we will mg daily.  And Cymbalta  90 mg daily (from rheumatologist). Today, patient notes that medications are effective in managing her symptoms.   Today the patient was well-groomed, pleasant, cooperative, engaged in eye contact, and engaged in conversation. She informed Clinical research associate that she gets irritated with her friend who lives with her. She notes that she she is also stressed about bills and finances.  Patient notes at times she thinks about hitting her head as this is something that she did in the past to cope.  She however notes that she will not engage in these behaviors.  To cope she reports that she goes horseback riding with friends.  Today provider conducted a GAD-7 and patient scored a 4, at her last visit she scored a 1.  Provider also conducted PHQ-9 patient scored a 9, her last visit she scored an 8.  She endorsed adequate sleep and appetite.  Today she endorses passive SI but denies wanting to harm herself.  She denies SI/HI/AVH, mania, paranoia.    Patient denies alcohol or  illegal drug use.  No medication changes made today.  Patient agreeable to continue medications as prescribed.  No other concerns noted at this time.      Visit Diagnosis:    ICD-10-CM   1. Bipolar I disorder (HCC)  F31.9 ARIPiprazole  (ABILIFY ) 15 MG tablet    buPROPion  (WELLBUTRIN  XL) 300 MG 24 hr tablet    2. Attention deficit hyperactivity disorder (ADHD), predominantly inattentive type  F90.0 buPROPion  (WELLBUTRIN  XL) 300 MG 24 hr tablet          Past Psychiatric History: Bipolar, anxiety, and depression Past Medical History:  Past Medical History:  Diagnosis Date   Anxiety    Arthritis    Back pain, chronic    Bipolar 1 disorder (HCC)    Chronic fatigue syndrome    DDD (degenerative disc disease), lumbar    Depression    Diabetes mellitus without complication (HCC)    Fibromyalgia    IUD 2012   Mirena   MTHFR gene mutation    Rheumatoid arteritis (HCC)     Past Surgical History:  Procedure Laterality Date   BREAST REDUCTION SURGERY Bilateral 01/07/2024   Procedure: Bilateral breast reduction with liposuction;  Surgeon: Thornell Flirt, DO;  Location: [facility];  Service: Plastics;  Laterality: Bilateral;   COLONOSCOPY WITH PROPOFOL  N/A 02/25/2017   Procedure: COLONOSCOPY WITH PROPOFOL ;  Surgeon: Evangeline Hilts, MD;  Location: [facility];  Service: Endoscopy;  Laterality: N/A;   left knee lateral release  1993   scar  tisue removal  03/2005   left ankle   TONSILLECTOMY  age 40's    Family Psychiatric History: Maternal aunts Bipolar disorder and uncle alcoholic  Family History:  Family History  Problem Relation Age of Onset   Diabetes Mother    Stroke Mother    Lupus Mother    Hypertension Mother    Congestive Heart Failure Mother    Mental illness Brother        Not clear what his diagnosis is--may be related to previous drug use.   Cancer Maternal Grandmother        colon   Diabetes Maternal Grandfather    Depression Maternal  Uncle    Alcohol abuse Maternal Uncle     Social History:  Social History   Socioeconomic History   Marital status: Single    Spouse name: Not on file   Number of children: 0   Years of education: Not on file   Highest education level: Bachelor's degree (e.g., BA, AB, BS)  Occupational History   Occupation: Airline pilot at times  Tobacco Use   Smoking status: Never   Smokeless tobacco: Never  Vaping Use   Vaping status: Never Used  Substance and Sexual Activity   Alcohol use: No    Alcohol/week: 0.0 standard drinks of alcohol   Drug use: No   Sexual activity: Not on file  Other Topics Concern   Not on file  Social History Narrative   Originally from [place] , outside of [place].    Moved here permanently 2012.   Intermittently worked on a farm.   Lives with many cats--3 plus fosters cats.    Single, lives alone in a one story home. Rarely drinks caffeine . 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   Social Drivers of Health   Financial Resource Strain: Medium Risk (06/12/2023)   Overall Financial Resource Strain (CARDIA)    Difficulty of Paying Living Expenses: Somewhat hard  Food Insecurity: Food Insecurity Present (06/12/2023)   Hunger Vital Sign    Worried About Running Out of Food in the Last Year: Sometimes true    Ran Out of Food in the Last Year: Never true  Transportation Needs: No Transportation Needs (06/12/2023)   PRAPARE - Administrator, Civil Service (Medical): No    Lack of Transportation (Non-Medical): No  Physical Activity: Insufficiently Active (07/27/2023)   Exercise Vital Sign    Days of Exercise per Week: 3 days    Minutes of Exercise per Session: 30 min  Stress: No Stress Concern Present (06/12/2023)   [employer] of Occupational Health - Occupational Stress Questionnaire    Feeling of Stress : Only a little  Social Connections: Socially Isolated (06/12/2023)   Social Connection and Isolation Panel [NHANES]     Frequency of Communication with Friends and Family: Never    Frequency of Social Gatherings with Friends and Family: Twice a week    Attends Religious Services: Never    Database administrator or Organizations: Yes    Attends Engineer, structural: More than 4 times per year    Marital Status: Never married    Allergies:  Allergies  Allergen Reactions   Metaxalone Hives    Metabolic Disorder Labs: Lab Results  Component Value Date   HGBA1C 6.0 (A) 03/08/2024   MPG 103 04/14/2018   Lab Results  Component Value Date   PROLACTIN 16.4 09/02/2023   Lab Results  Component Value Date   CHOL  132 09/02/2023   TRIG 201 (H) 09/02/2023   HDL 39 (L) 09/02/2023   CHOLHDL 3.4 09/02/2023   LDLCALC 60 09/02/2023   LDLCALC 41 09/10/2022   Lab Results  Component Value Date   TSH 1.030 09/02/2023   TSH 0.866 11/21/2020    Therapeutic Level Labs: No results found for: "LITHIUM" No results found for: "VALPROATE" No results found for: "CBMZ"  Current Medications: Current Outpatient Medications  Medication Sig Dispense Refill   albuterol  (VENTOLIN  HFA) 108 (90 Base) MCG/ACT inhaler Take 2 puffs 15 minutes before exercise and as needed for shortness of breath. 6.7 g 1   ARIPiprazole  (ABILIFY ) 15 MG tablet Take 1 tablet (15 mg total) by mouth daily. 30 tablet 3   atorvastatin  (LIPITOR) 20 MG tablet Take 1 tablet (20 mg total) by mouth daily. 90 tablet 0   Azelastine  HCl 137 MCG/SPRAY SOLN 2 SPRAY IN ALTERNATE NOSTRILS TWICE A DAY 30 mL 0   baclofen  (LIORESAL ) 10 MG tablet TAKE 1 TABLET BY MOUTH TWICE A DAY AS NEEDED FOR MUSCLE SPASMS 180 tablet 1   blood glucose meter kit and supplies KIT Dispense based on patient and insurance preference. Use up to four times daily as directed. 1 each 0   buPROPion  (WELLBUTRIN  XL) 300 MG 24 hr tablet Take 1 tablet (300 mg total) by mouth daily. 30 tablet 3   cholecalciferol (VITAMIN D3) 25 MCG (1000 UT) tablet Take 1,000 Units by mouth daily.      diclofenac  (VOLTAREN ) 75 MG EC tablet Take 1 tablet (75 mg total) by mouth 2 (two) times daily. 60 tablet 0   diclofenac  Sodium (VOLTAREN ) 1 % GEL Apply 2 g topically 4 (four) times daily. To affected joint. 100 g 11   DULoxetine  HCl 30 MG CSDR Take 90 mg by mouth daily.     fluticasone  (FLONASE ) 50 MCG/ACT nasal spray 2 SPRAYS IN EACH NOSTRIL DAILY 16 g 0   HYDROcodone -acetaminophen  (NORCO/VICODIN) 5-325 MG tablet Take 1 tablet by mouth 2 (two) times daily as needed for moderate pain (pain score 4-6) (back pain). 60 tablet 0   hydroxychloroquine (PLAQUENIL) 200 MG tablet Take 200 mg by mouth 2 (two) times daily. 200mg  twice daily on weekdays and 200mg  once daily on weekends as of 12/03/22 review     levocetirizine (XYZAL ) 5 MG tablet TAKE 1 TABLET BY MOUTH EVERY EVENING 34 tablet 1   metFORMIN  (GLUCOPHAGE ) 1000 MG tablet Take 1 tablet (1,000 mg total) by mouth 2 (two) times daily with a meal. 60 tablet 0   montelukast  (SINGULAIR ) 10 MG tablet Take 1 tablet (10 mg total) by mouth at bedtime. 90 tablet 3   Multiple Vitamin (MULTIVITAMIN WITH MINERALS) TABS tablet Take 1 tablet by mouth daily.     OZEMPIC , 2 MG/DOSE, 8 MG/3ML SOPN INJECT 2 MG AS DIRECTED ONCE A WEEK. 9 mL 0   predniSONE  (STERAPRED UNI-PAK 21 TAB) 10 MG (21) TBPK tablet Take by mouth daily. Take 6 tabs by mouth daily  for 2 days, then 5 tabs for 2 days, then 4 tabs for 2 days, then 3 tabs for 2 days, 2 tabs for 2 days, then 1 tab by mouth daily for 2 days 42 tablet 0   pregabalin  (LYRICA ) 200 MG capsule TAKE 1 CAPSULE BY MOUTH 2-3 TIMES DAILY 90 capsule 3   Semaglutide , 1 MG/DOSE, 4 MG/3ML SOPN Inject 1 mg as directed once a week. 3 mL 0   solifenacin  (VESICARE ) 10 MG tablet Take 1 tablet (10 mg  total) by mouth daily. 30 tablet 11   tiZANidine  (ZANAFLEX ) 4 MG capsule Take 1 capsule (4 mg total) by mouth 3 (three) times daily.     No current facility-administered medications for this visit.     Musculoskeletal: Strength &  Muscle Tone: within normal limits and  telehealth visit Gait & Station: normal, telehealth visit Patient leans: N/A  Psychiatric Specialty Exam: Review of Systems  There were no vitals taken for this visit.There is no height or weight on file to calculate BMI.  General Appearance: Well Groomed  Eye Contact:  Good  Speech:  Clear and Coherent and Normal Rate  Volume:  Normal  Mood:  Euthymic  Affect:  Congruent  Thought Process:  Coherent, Goal Directed and Linear  Orientation:  Full (Time, Place, and Person)  Thought Content: WDL and Logical   Suicidal Thoughts:  No  Homicidal Thoughts:  No  Memory:  Immediate;   Good Recent;   Good Remote;   Good  Judgement:  Good  Insight:  Good  Psychomotor Activity:  Normal  Concentration:  Concentration: Good and Attention Span: Good  Recall:  Good  Fund of Knowledge: Good  Language: Good  Akathisia:  No  Handed:  Right  AIMS (if indicated): not done  Assets:  Communication Skills Desire for Improvement Financial Resources/Insurance Housing Social Support  ADL's:  Intact  Cognition: WNL  Sleep:  Good   Screenings: AIMS    Flowsheet Row Clinical Support from 06/30/2023 in [facility]  AIMS Total Score 5 0      AUDIT    Flowsheet Row [facility]  Alcohol Use Disorder Identification Test Final Score (AUDIT) 0      GAD-7    Flowsheet Row Video Visit from 04/13/2024 in [facility] Video Visit from 01/21/2024 in [facility] Office Visit from 09/11/2023 in [facility] Medicine at [facility] Video Visit from 09/10/2023 in [facility] Counselor from 07/27/2023 in [facility]  Total GAD-7 Score 4 1 2 3 4        PHQ2-9    Flowsheet Row Video Visit from 04/13/2024 in [facility] Video Visit from 01/21/2024 in [facility] Office Visit from 09/11/2023 in [facility] Primary Care & Sports Medicine at [facility] Video Visit from 09/10/2023 in [facility] Counselor from 07/27/2023 in [facility]  PHQ-2 Total Score 2 2 2 1 2   PHQ-9 Total Score 9 8 9 8 10       Flowsheet Row ED from 04/11/2024 in [facility] ED from 03/14/2024 in [facility] at [facility] Video Visit from 01/21/2024 in [facility]  C-SSRS RISK CATEGORY No Risk No Risk No Risk        Assessment and Plan: Patient notes at times she becomes stressed and irritated with her roommate.  She however notes that she is able to cope with this.  No other medication changes made today.  Patient agreeable to continue medications as prescribed.   1. Bipolar I disorder (HCC)  Continue- ARIPiprazole  (ABILIFY ) 15 MG tablet; Take 1 tablet (15 mg total) by mouth daily.  Dispense: 30 tablet; Refill: 3 Continue- buPROPion  (WELLBUTRIN  XL) 300 MG 24 hr  tablet; Take 1 tablet (300 mg total) by mouth daily.  Dispense: 30 tablet; Refill: 3  2. Attention deficit hyperactivity disorder (ADHD), predominantly inattentive type  Continue- buPROPion  (WELLBUTRIN  XL) 300 MG 24 hr tablet; Take 1 tablet (300 mg total) by mouth daily.  Dispense: 30 tablet; Refill: 3    Follow up in 2.5 moths Follow up with therapy   Arlyne Bering, NP 04/13/2024, 1:22 PM

## 2024-04-13 NOTE — Telephone Encounter (Signed)
 03/08/2024 last refill .Aaron AasPDMP reviewed during this encounter. No concerns UTD appt Sent refill of norco

## 2024-04-15 ENCOUNTER — Ambulatory Visit (HOSPITAL_COMMUNITY): Payer: MEDICAID | Admitting: Mental Health

## 2024-04-15 DIAGNOSIS — F319 Bipolar disorder, unspecified: Secondary | ICD-10-CM

## 2024-04-15 DIAGNOSIS — F603 Borderline personality disorder: Secondary | ICD-10-CM

## 2024-04-15 NOTE — Progress Notes (Signed)
 THERAPIST PROGRESS NOTE Virtual Visit via Video Note  I connected with Frances [name] on 04/15/24 at 11:00 AM EDT by a video enabled telemedicine application and verified that I am speaking with the correct person using two identifiers.  Location: Patient: friends home- [place] Provider: home office   I discussed the limitations of evaluation and management by telemedicine and the availability of in person appointments. The patient expressed understanding and agreed to proceed.  I discussed the assessment and treatment plan with the patient. The patient was provided an opportunity to ask questions and all were answered. The patient agreed with the plan and demonstrated an understanding of the instructions.   The patient was advised to call back or seek an in-person evaluation if the symptoms worsen or if the condition fails to improve as anticipated.  I provided 36 minutes of non-face-to-face time during this encounter.   Frances [name], [facility]   Session Time: 11:03 am (36 minutes)  Participation Level: Active  Behavioral Response: CasualAlertEuthymic  Type of Therapy: Individual Therapy  Treatment Goals addressed:  STG: Frances [name] will increase control over thoughts AEB ability to engage in x 3 coping skills with ability to reframe thoughts as needed within the next 90 days.   ProgressTowards Goals: Progressing  Interventions: Supportive  Summary:  Frances [name] is a 48 y.o. female who presents with dx of Bipolar disorder, borderline personality disorder, GAD and ADHD. Presents for session alert and oriented; mood and affect blunted; neutral but stable. Speech clear and coherent at normal rate and tone. Denies excessive highs or lows and reports feeling of control over her thoughts. Shares to be doing ok with moods to be "up and down." Shares for friend to continue to live in her home with a proposed move out date of June 1 but concerned she will not be able to  mve out due to breaking leg. Shares feelings of fustration with this and has had thoughts of self\-harm head banging. Explored use of distress tolerance with therapist and ability to firmly community with roommate. 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 Notes ongoing back pain and awating to hear if she will be to receive intervention. Exploring when to refile for disability. Denies safety concerns; ongoing work towards goals.   Suicidal/Homicidal: Nowithout intent/plan  Therapist Response: Therapist engaged Frances [name] in tele-therapy session. Completed check in and reviewed current level of functioning, sxs management and level of stressors. Provided supportive encouragement validated feelings. Engaged Frances [name] in exploring ability manage stressors and explored for self harm and safety concerns. Explored distress tolerance and working to maintain boundaries with friend with counsequences for boundaries as they are respected. Active empathic listening and encouraged engaging in things in which she enjoys and attending to tasks around home. Reviewed session and provided follow up  Plan: Return again in  x7 weeks.  Diagnosis: Bipolar I disorder (HCC)  Borderline personality disorder (HCC)  Collaboration of Care: Other None  Patient/Guardian was advised Release of Information must be obtained prior to any record release in order to collaborate their care with an outside provider. Patient/Guardian was advised if they have not already done so to contact the registration department to sign all necessary forms in order for us  to release information regarding their care.   Consent: Patient/Guardian gives verbal consent for treatment and assignment of benefits for services provided during this  visit. Patient/Guardian expressed understanding and agreed to proceed.   Frances [name] [place],  [facility] 04/15/2024

## 2024-04-19 ENCOUNTER — Ambulatory Visit: Payer: Self-pay

## 2024-04-19 NOTE — Telephone Encounter (Signed)
 Patient is calling to inform Dr. Elva Hamburger that the MR LUMBAR SPINE WO CONTRAST he had previously ordered was declined for coverage and [facility]  is requesting a peer to peer.        Deadline for Appeal Process: 06/06/2024        Appeal filing Help Number:[phone]        Contact Information Peer to Peer: Not provided in patient letter    Copied from CRM [id number]. Topic: Clinical - Medical Advice >> Apr 19, 2024  8:06 AM Danelle Dunning F wrote: Reason for CRM:   Patient is calling to inform Dr. Elva Hamburger that the MR LUMBAR SPINE WO CONTRAST he had previously ordered was declined for coverage and [facility]  is requesting a peer to peer.   Deadline for Appeal Process: 06/06/2024  Appeal filing Help Number:[phone]   Contact Information Peer to Peer: Not provided in patient letter Reason for Disposition  [1] Follow-up call from patient regarding patient's clinical status AND [2] information NON-URGENT  Answer Assessment - Initial Assessment Questions 1. REASON FOR CALL or QUESTION: "What is your reason for calling today?" or "How can I best help you?" or "What question do you have that I can help answer?"     Please see nurse triage notes 2. CALLER: Document the source of call. (e.g., laboratory, patient).     Patient  Protocols used: PCP Call - No Triage-A-AH

## 2024-04-20 ENCOUNTER — Encounter: Payer: Self-pay | Admitting: Physician Assistant

## 2024-04-20 DIAGNOSIS — E1169 Type 2 diabetes mellitus with other specified complication: Secondary | ICD-10-CM

## 2024-04-21 NOTE — Telephone Encounter (Signed)
 FYI - regarding an MRI order.

## 2024-04-21 NOTE — Telephone Encounter (Signed)
They have been faxed

## 2024-04-21 NOTE — Addendum Note (Signed)
 Addended by: Gean Keels on: 04/21/2024 01:32 PM   Modules accepted: Orders

## 2024-04-21 NOTE — Telephone Encounter (Addendum)
 Called for peer to peer. Tracking# [id number] Call ref# [id number]   I spoke to the physician, we will need to resubmit with additional documentation including which exercises she did and how often she did them as well as the frequency and then it will be approved.

## 2024-04-26 MED ORDER — METFORMIN HCL 1000 MG PO TABS
1000.0000 mg | ORAL_TABLET | Freq: Two times a day (BID) | ORAL | 0 refills | Status: AC
Start: 2024-04-26 — End: ?

## 2024-04-28 ENCOUNTER — Ambulatory Visit: Payer: MEDICAID | Admitting: Sports Medicine

## 2024-04-28 DIAGNOSIS — M47816 Spondylosis without myelopathy or radiculopathy, lumbar region: Secondary | ICD-10-CM

## 2024-04-28 NOTE — Progress Notes (Signed)
    Procedures performed today:    None.  Independent interpretation of notes and tests performed by another provider:   None.  Brief History, Exam, Impression, and Recommendations:    Lumbar spondylosis [name] returns, she is a 48 year old female, multilevel lumbar spinal stenosis moderate L3-L4, she has had multiple injections including epidurals, facet injections and medial branch blocks, radiofrequency ablation, Lyrica  200 mg 3 times daily. We did order an epidural, but this has not yet been done, we also had wanted an updated MRI of her lumbar spine. We have had some difficulty getting this approved by her insurance, I have done several peer to peer calls, once on 04/18/2024, again on 04/21/2024.  From a physical exam perspective she has axial low back pain reproducible with palpation, she does not have any radiculitis, symptoms are predominantly axial.  Motion is good, strength is good.  We will try to get her approved again, in the meantime we will add additional formal physical therapy.  For insurance coverage purposes she has failed over 6 weeks of physician directed conservative treatment, formal therapy, chronic longitudinal low back pain that failed medication, epidurals in the past have been effective and she is due for another epidural but we need a new updated lumbar MRI before we can consider the injection. The patient has performed multiple back exercises including the McKenzie method back exercises with core stabilization, strengthening, planks, stretching, she has done this 3 times per day every other day for over 6 weeks within the last 6 months and has not sufficiently improved.  Her epidural has been ordered, she would like to proceed with this as soon as we get the MRI results.    ____________________________________________ Joselyn Nicely. Sandy Crumb, M.D., ABFM., CAQSM., AME. Primary Care and Sports Medicine [facility]  Adjunct Professor of  [facility] Medicine  [employer]  Restaurant manager, fast food

## 2024-04-28 NOTE — Assessment & Plan Note (Addendum)
 Russ Course returns, she is a 48 year old female, multilevel lumbar spinal stenosis moderate L3-L4, she has had multiple injections including epidurals, facet injections and medial branch blocks, radiofrequency ablation, Lyrica  200 mg 3 times daily. We did order an epidural, but this has not yet been done, we also had wanted an updated MRI of her lumbar spine. We have had some difficulty getting this approved by her insurance, I have done several peer to peer calls, once on 04/18/2024, again on 04/21/2024.  From a physical exam perspective she has axial low back pain reproducible with palpation, she does not have any radiculitis, symptoms are predominantly axial.  Motion is good, strength is good.  We will try to get her approved again, in the meantime we will add additional formal physical therapy.  For insurance coverage purposes she has failed over 6 weeks of physician directed conservative treatment, formal therapy, chronic longitudinal low back pain that failed medication, epidurals in the past have been effective and she is due for another epidural but we need a new updated lumbar MRI before we can consider the injection. The patient has performed multiple back exercises including the McKenzie method back exercises with core stabilization, strengthening, planks, stretching, she has done this 3 times per day every other day for over 6 weeks within the last 6 months and has not sufficiently improved.  Her epidural has been ordered, she would like to proceed with this as soon as we get the MRI results.

## 2024-04-29 ENCOUNTER — Other Ambulatory Visit: Payer: Self-pay | Admitting: Physician Assistant

## 2024-05-02 ENCOUNTER — Other Ambulatory Visit: Payer: Self-pay | Admitting: Physician Assistant

## 2024-05-02 ENCOUNTER — Ambulatory Visit: Payer: MEDICAID

## 2024-05-02 ENCOUNTER — Ambulatory Visit
Admission: EM | Admit: 2024-05-02 | Discharge: 2024-05-02 | Disposition: A | Discharge status: Home / Self Care | Payer: MEDICAID | Attending: Family Medicine | Admitting: Family Medicine

## 2024-05-02 ENCOUNTER — Encounter: Payer: Self-pay | Admitting: Physician Assistant

## 2024-05-02 DIAGNOSIS — S20211A Contusion of right front wall of thorax, initial encounter: Secondary | ICD-10-CM | POA: Diagnosis not present

## 2024-05-02 DIAGNOSIS — S20212A Contusion of left front wall of thorax, initial encounter: Secondary | ICD-10-CM | POA: Diagnosis not present

## 2024-05-02 DIAGNOSIS — W11XXXA Fall on and from ladder, initial encounter: Secondary | ICD-10-CM | POA: Diagnosis not present

## 2024-05-02 DIAGNOSIS — R0781 Pleurodynia: Secondary | ICD-10-CM

## 2024-05-02 MED ORDER — OXYCODONE-ACETAMINOPHEN 5-325 MG PO TABS: 1.0000 | ORAL_TABLET | Freq: Three times a day (TID) | ORAL | 0 refills | Status: AC | PRN

## 2024-05-02 NOTE — Discharge Instructions (Addendum)
 Advised patient of bilateral rib x-ray results that were negative.  Advised may use Percocet for acute/severe bilateral rib pain for the next 3 to 4 days.  Advised patient not to take Norco when taking Percocet.  Patient advised of sedative effects of this medication.  Encouraged to increase daily water intake to 64 ounces per day when taking this medication.  Advised if symptoms worsen and/or unresolved please follow-up with your PCP or here for further evaluation.

## 2024-05-02 NOTE — ED Provider Notes (Signed)
 [name]    CSN: [id number] Arrival date & time: 05/02/24  0859      History   Chief Complaint Chief Complaint  Patient presents with   Fall    HPI Frances [name] is a 48 y.o. female.   HPI 48 year old female presents with back pain and rib pain secondary to fall that occurred on Saturday.  Patient reports accidentally falling off a ladder while changing light bulb this past Saturday, 04/30/2024.[name]  PMH significant for chronic back pain, degenerative disc disease of lumbar spine, and fibromyalgia.  Past Medical History:  Diagnosis Date   Anxiety    Arthritis    Back pain, chronic    Bipolar 1 disorder (HCC)    Chronic fatigue syndrome    DDD (degenerative disc disease), lumbar    Depression    Diabetes mellitus without complication (HCC)    Fibromyalgia    IUD 2012   Mirena   MTHFR gene mutation    Rheumatoid arteritis [facility])     Patient Active Problem List   Diagnosis Date Noted   Carpal tunnel syndrome, bilateral 02/25/2024   Pain in left third proximal interphalangeal joint 10/16/2023   Personal history UTI 09/14/2023   Rheumatoid arthritis 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 (HCC) 09/14/2023   Urinary hesitancy 06/30/2023   Urinary frequency 06/16/2023   OAB (overactive bladder) 06/16/2023   Chronic pain syndrome 06/10/2023   Crush injury of right foot 05/25/2023   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   Microalbuminuria 09/09/2022   Night sweats 06/18/2022   Proteinuria 03/17/2022   Chronic right hip pain 01/06/2022   Impingement syndrome, shoulder, right 01/06/2022   ETD (Eustachian tube dysfunction), bilateral 08/23/2021   SOB (shortness of breath) on exertion 08/20/2021   Overweight (BMI 25.0-29.9) 06/18/2021   Attention deficit hyperactivity disorder (ADHD), 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   Hyperlipidemia LDL goal <70 03/18/2021   Diabetes mellitus (HCC) 03/13/2021   Seasonal allergies 12/25/2020    Symptomatic mammary hypertrophy 09/04/2020   Recurrent major depressive disorder, in partial remission (HCC) 05/24/2020   Generalized anxiety disorder 05/24/2020   DDD (degenerative disc disease), cervical 03/13/2020   Ingrown right big toenail 03/13/2020   Labral tear of right hip joint, degenerative 01/11/2020   Grief reaction 11/28/2019   Borderline personality disorder (HCC) 01/22/2018   Seronegative rheumatoid arthritis (HCC) 12/09/2017   New daily persistent headache 09/26/2017   MTHFR gene mutation 09/20/2017   Bipolar I disorder (HCC) 09/16/2017   Chronic fatigue syndrome 09/16/2017   Back pain 10/13/2008   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   Lumbar spondylosis 02/10/2008   FIBROMYALGIA 02/10/2008   Other specified abnormal findings of blood chemistry 02/10/2008    Past Surgical History:  Procedure Laterality Date   BREAST REDUCTION SURGERY Bilateral 01/07/2024   Procedure: Bilateral breast reduction with liposuction;  Surgeon: Thornell Flirt, DO;  Location: [facility];  Service: Plastics;  Laterality: Bilateral;   COLONOSCOPY WITH PROPOFOL  N/A 02/25/2017   Procedure: COLONOSCOPY WITH PROPOFOL ;  Surgeon: Evangeline Hilts, MD;  Location: [facility];  Service: Endoscopy;  Laterality: N/A;   left knee lateral release  1993   scar tisue removal  03/2005   left ankle   TONSILLECTOMY  age 34's    OB History     Gravida  0   Para  0   Term  0   Preterm  0   AB  0   Living  0      SAB  0   IAB  0   Ectopic  0   Multiple  0   Live Births               Home Medications    Prior to Admission medications   Medication Sig Start Date End Date Taking? Authorizing Provider  oxyCODONE -acetaminophen  (PERCOCET/ROXICET) 5-325 MG tablet Take 1 tablet by mouth every 8 (eight) hours as needed for up to 4 days for severe pain (pain score 7-10). 05/02/24 05/06/24 Yes Leonides Ramp, FNP  albuterol  (VENTOLIN  HFA) 108 (90 Base) MCG/ACT inhaler Take  2 puffs 15 minutes before exercise and as needed for shortness of breath. 08/27/21   Breeback, Jade L, PA-C  ARIPiprazole  (ABILIFY ) 15 MG tablet Take 1 tablet (15 mg total) by mouth daily. 04/13/24   Arlyne Bering, NP  atorvastatin  (LIPITOR) 20 MG tablet Take 1 tablet (20 mg total) by mouth daily. 02/17/24   Breeback, Jade L, PA-C  Azelastine  HCl 137 MCG/SPRAY SOLN 2 SPRAY IN ALTERNATE NOSTRILS TWICE A DAY 04/06/24   Breeback, Jade L, PA-C  baclofen  (LIORESAL ) 10 MG tablet TAKE 1 TABLET BY MOUTH TWICE A DAY AS NEEDED FOR MUSCLE SPASMS 11/02/23   Breeback, Jade L, PA-C  blood glucose meter kit and supplies KIT Dispense based on patient and insurance preference. Use up to four times daily as directed. 05/29/21   Breeback, Jade L, PA-C  buPROPion  (WELLBUTRIN  XL) 300 MG 24 hr tablet Take 1 tablet (300 mg total) by mouth daily. 04/13/24   Arlyne Bering, NP  cholecalciferol (VITAMIN D3) 25 MCG (1000 UT) tablet Take 1,000 Units by mouth daily.    [provider]  diclofenac  (VOLTAREN ) 75 MG EC tablet Take 1 tablet (75 mg total) by mouth 2 (two) times daily. 10/19/22   Stephany Ehrich, MD  diclofenac  Sodium (VOLTAREN ) 1 % GEL Apply 2 g topically 4 (four) times daily. To affected joint. 10/16/23   Gean Keels, MD  DULoxetine  HCl 30 MG CSDR Take 90 mg by mouth daily.    [provider]  fluticasone  (FLONASE ) 50 MCG/ACT nasal spray 2 SPRAYS IN EACH NOSTRIL DAILY 03/01/24   Breeback, Jade L, PA-C  HYDROcodone -acetaminophen  (NORCO/VICODIN) 5-325 MG tablet Take 1 tablet by mouth 2 (two) times daily as needed for moderate pain (pain score 4-6) (back pain). 04/13/24   Breeback, Jade L, PA-C  HYDROcodone -acetaminophen  (NORCO/VICODIN) 5-325 MG tablet Take 1 tablet by mouth 2 (two) times daily as needed for moderate pain (pain score 4-6) (back pain). 05/13/24   Breeback, Jade L, PA-C  hydroxychloroquine (PLAQUENIL) 200 MG tablet Take 200 mg by mouth 2 (two) times daily. 200mg  twice daily  on weekdays and 200mg  once daily on weekends as of 12/03/22 review    [provider]  levocetirizine (XYZAL ) 5 MG tablet TAKE 1 TABLET BY MOUTH EVERY EVENING 04/01/24   Breeback, Jade L, PA-C  metFORMIN  (GLUCOPHAGE ) 1000 MG tablet Take 1 tablet (1,000 mg total) by mouth 2 (two) times daily with a meal. 04/26/24   Breeback, Jade L, PA-C  montelukast  (SINGULAIR ) 10 MG tablet Take 1 tablet (10 mg total) by mouth at bedtime. 07/13/23   Breeback, Jade L, PA-C  Multiple Vitamin (MULTIVITAMIN WITH MINERALS) TABS tablet Take 1 tablet by mouth daily.    [provider]  pregabalin  (LYRICA ) 200 MG capsule TAKE 1 CAPSULE BY MOUTH 2-3 TIMES DAILY 02/25/24   Gean Keels, MD  solifenacin  (VESICARE ) 10 MG tablet Take 1 tablet (10 mg total)  by mouth daily. 08/13/23   Stoneking, Ponce Brisker., MD  tiZANidine  (ZANAFLEX ) 4 MG capsule Take 1 capsule (4 mg total) by mouth 3 (three) times daily. 03/22/24   Araceli Knight, PA-C    Family History Family History  Problem Relation Age of Onset   Diabetes Mother    Stroke Mother    Lupus Mother    Hypertension Mother    Congestive Heart Failure Mother    Mental illness Brother        Not clear what his diagnosis is--may be related to previous drug use.   Cancer Maternal Grandmother        colon   Diabetes Maternal Grandfather    Depression Maternal Uncle    Alcohol abuse Maternal Uncle     Social History Social History   Tobacco Use   Smoking status: Never   Smokeless tobacco: Never  Vaping Use   Vaping status: Never Used  Substance Use Topics   Alcohol use: No    Alcohol/week: 0.0 standard drinks of alcohol   Drug use: No     Allergies   Metaxalone   Review of Systems Review of Systems  Musculoskeletal:  Positive for back pain.       Bilateral posterior rib pain secondary to fall from ladder when changing light bulb on Saturday, 09/30/2024.  All other systems reviewed and are negative.    Physical Exam Triage Vital  Signs ED Triage Vitals  Encounter Vitals Group     BP      Systolic BP Percentile      Diastolic BP Percentile      Pulse      Resp      Temp      Temp src      SpO2      Weight      Height      Head Circumference      Peak Flow      Pain Score      Pain Loc      Pain Education      Exclude from Growth Chart    No data found.  Updated Vital Signs BP 96/75   Pulse (!) 101   Temp 98.8 F (37.1 C)   Resp 16   LMP 03/29/2024 (Approximate)   SpO2 98%    Physical Exam Vitals and nursing note reviewed.  Constitutional:      Appearance: Normal appearance. She is obese. She is ill-appearing.  HENT:     Head: Normocephalic and atraumatic.     Mouth/Throat:     Mouth: Mucous membranes are moist.     Pharynx: Oropharynx is clear.  Eyes:     Extraocular Movements: Extraocular movements intact.     Conjunctiva/sclera: Conjunctivae normal.     Pupils: Pupils are equal, round, and reactive to light.  Cardiovascular:     Rate and Rhythm: Normal rate and regular rhythm.     Pulses: Normal pulses.     Heart sounds: Normal heart sounds.  Pulmonary:     Effort: Pulmonary effort is normal.     Breath sounds: Normal breath sounds. No wheezing, rhonchi or rales.  Musculoskeletal:        General: Normal range of motion.     Cervical back: Normal range of motion and neck supple.     Comments: Bilateral posterior rib area tender to palpation, no deformity noted  Skin:    General: Skin is warm and dry.  Neurological:  General: No focal deficit present.     Mental Status: She is alert and oriented to person, place, and time. Mental status is at baseline.  Psychiatric:        Mood and Affect: Mood normal.        Behavior: Behavior normal.      UC Treatments / Results  Labs (all labs ordered are listed, but only abnormal results are displayed) Labs Reviewed - No data to display  EKG   Radiology DG Ribs Unilateral W/Chest Right Result Date: 05/02/2024 CLINICAL DATA:   Fall onto right ribs from ladder 2 days ago; Left rib pain x 2 days secondary to fall from ladder EXAM: RIGHT RIBS AND CHEST - 3+ VIEW; LEFT RIBS AND CHEST - 3+ VIEW COMPARISON:  None Available. FINDINGS: Bilateral lung fields are clear. Bilateral costophrenic angles are clear. Normal cardio-mediastinal silhouette. No acute osseous abnormalities. No acute rib fracture.  No focal rib lesion. Old healed left posterolateral ninth rib fracture noted. The soft tissues are within normal limits. IMPRESSION: *No acute cardiopulmonary abnormality. *No acute rib fracture. No focal rib lesion. Electronically Signed   By: Beula Brunswick M.D.   On: 05/02/2024 10:07   DG Ribs Unilateral W/Chest Left Result Date: 05/02/2024 CLINICAL DATA:  Fall onto right ribs from ladder 2 days ago; Left rib pain x 2 days secondary to fall from ladder EXAM: RIGHT RIBS AND CHEST - 3+ VIEW; LEFT RIBS AND CHEST - 3+ VIEW COMPARISON:  None Available. FINDINGS: Bilateral lung fields are clear. Bilateral costophrenic angles are clear. Normal cardio-mediastinal silhouette. No acute osseous abnormalities. No acute rib fracture.  No focal rib lesion. Old healed left posterolateral ninth rib fracture noted. The soft tissues are within normal limits. IMPRESSION: *No acute cardiopulmonary abnormality. *No acute rib fracture. No focal rib lesion. Electronically Signed   By: Beula Brunswick M.D.   On: 05/02/2024 10:07    Procedures Procedures (including critical care time)  Medications Ordered in UC Medications - No data to display  Initial Impression / Assessment and Plan / UC Course  I have reviewed the triage vital signs and the nursing notes.  Pertinent labs & imaging results that were available during my care of the patient were reviewed by me and considered in my medical decision making (see chart for details).     MDM: 1.  Rib pain on left side-left rib x-ray results revealed above, Rx'd Percocet 5/325 mg tablet: Take 1 tablet by mouth  every 8 hours, as needed for severe left rib pain; 2.  Contusion of rib on the left side, initial encounter-please see above results; 3.  Rib pain on right side-right rib x-ray results revealed above, Rx'd Percocet 5/325 mg tablet: Take 1 tablet by mouth every 8 hours as needed for severe right rib pain; 4.  Contusion of rib on right side, initial encounter-please see above results. Advised patient of bilateral rib x-ray results that were negative.  Advised may use Percocet for acute/severe bilateral rib pain for the next 3 to 4 days.  Advised patient not to take Norco when taking Percocet.  Patient advised of sedative effects of this medication.  Encouraged to increase daily water intake to 64 ounces per day when taking this medication.  Advised if symptoms worsen and/or unresolved please follow-up with your PCP or here for further evaluation.  Patient discharged home, hemodynamically stable. Final Clinical Impressions(s) / UC Diagnoses   Final diagnoses:  Rib pain on left side  Rib pain on right  side  Contusion of rib on right side, initial encounter  Contusion of rib on left side, initial encounter     Discharge Instructions      Advised patient of bilateral rib x-ray results that were negative.  Advised may use Percocet for acute/severe bilateral rib pain for the next 3 to 4 days.  Advised patient not to take Norco when taking Percocet.  Patient advised of sedative effects of this medication.  Encouraged to increase daily water intake to 64 ounces per day when taking this medication.  Advised if symptoms worsen and/or unresolved please follow-up with your PCP or here for further evaluation.   ED Prescriptions     Medication Sig Dispense Auth. Provider   oxyCODONE -acetaminophen  (PERCOCET/ROXICET) 5-325 MG tablet Take 1 tablet by mouth every 8 (eight) hours as needed for up to 4 days for severe pain (pain score 7-10). 12 tablet Rachal Dvorsky, FNP      I have reviewed the PDMP during this  encounter.   Leonides Ramp, FNP 05/02/24 1034

## 2024-05-02 NOTE — ED Triage Notes (Signed)
 Pt presents to [facility] with co fall on Saturday. Pt reports she fell off a ladder onto her left side. And now has low/mid back pain following the base of her ribs  following the incident. Pt has medication rx for her chronic back pain that she has been taking.

## 2024-05-03 NOTE — Telephone Encounter (Signed)
 Requesting rx rf of ozempic  1mg   Not found in current med list  Last OV 03/21/2024 Upcoming appt 06/07/2024

## 2024-05-04 ENCOUNTER — Ambulatory Visit: Payer: MEDICAID | Admitting: Sports Medicine

## 2024-05-04 MED ORDER — SEMAGLUTIDE (1 MG/DOSE) 4 MG/3ML ~~LOC~~ SOPN
1.0000 mg | PEN_INJECTOR | SUBCUTANEOUS | 0 refills | Status: AC
Start: 2024-05-04 — End: ?

## 2024-05-05 ENCOUNTER — Encounter: Payer: Self-pay | Admitting: Sports Medicine

## 2024-05-07 ENCOUNTER — Ambulatory Visit: Payer: MEDICAID

## 2024-05-07 DIAGNOSIS — M5416 Radiculopathy, lumbar region: Secondary | ICD-10-CM

## 2024-05-07 DIAGNOSIS — M47816 Spondylosis without myelopathy or radiculopathy, lumbar region: Secondary | ICD-10-CM

## 2024-05-09 ENCOUNTER — Other Ambulatory Visit: Payer: Self-pay | Admitting: Physician Assistant

## 2024-05-11 ENCOUNTER — Ambulatory Visit: Payer: MEDICAID | Attending: Sports Medicine

## 2024-05-11 ENCOUNTER — Other Ambulatory Visit: Payer: Self-pay

## 2024-05-11 DIAGNOSIS — M5416 Radiculopathy, lumbar region: Secondary | ICD-10-CM | POA: Diagnosis present

## 2024-05-11 DIAGNOSIS — R293 Abnormal posture: Secondary | ICD-10-CM | POA: Diagnosis present

## 2024-05-11 DIAGNOSIS — M47816 Spondylosis without myelopathy or radiculopathy, lumbar region: Secondary | ICD-10-CM | POA: Insufficient documentation

## 2024-05-11 DIAGNOSIS — M6281 Muscle weakness (generalized): Secondary | ICD-10-CM | POA: Diagnosis present
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
## 2024-05-17 ENCOUNTER — Encounter: Payer: Self-pay | Admitting: Physician Assistant

## 2024-05-17 DIAGNOSIS — G894 Chronic pain syndrome: Secondary | ICD-10-CM

## 2024-05-17 DIAGNOSIS — M47816 Spondylosis without myelopathy or radiculopathy, lumbar region: Secondary | ICD-10-CM

## 2024-05-17 DIAGNOSIS — G8929 Other chronic pain: Secondary | ICD-10-CM

## 2024-05-18 MED ORDER — TIZANIDINE HCL 4 MG PO CAPS
4.0000 mg | ORAL_CAPSULE | Freq: Three times a day (TID) | ORAL | 0 refills | Status: DC
Start: 2024-05-18 — End: 2024-06-21

## 2024-05-24 ENCOUNTER — Ambulatory Visit: Payer: MEDICAID | Attending: Sports Medicine | Admitting: Physical Therapy

## 2024-05-24 ENCOUNTER — Encounter: Payer: Self-pay | Admitting: Physical Therapy

## 2024-05-24 DIAGNOSIS — M6281 Muscle weakness (generalized): Secondary | ICD-10-CM | POA: Insufficient documentation

## 2024-05-24 DIAGNOSIS — M5416 Radiculopathy, lumbar region: Secondary | ICD-10-CM | POA: Insufficient documentation
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
## 2024-05-30 ENCOUNTER — Ambulatory Visit (INDEPENDENT_AMBULATORY_CARE_PROVIDER_SITE_OTHER): Payer: MEDICAID | Admitting: Sports Medicine

## 2024-05-30 ENCOUNTER — Encounter: Payer: Self-pay | Admitting: Sports Medicine

## 2024-05-30 DIAGNOSIS — M47816 Spondylosis without myelopathy or radiculopathy, lumbar region: Secondary | ICD-10-CM
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
## 2024-05-30 NOTE — Progress Notes (Signed)
    Procedures performed today:    None.  Independent interpretation of notes and tests performed by another provider:   MRI personally reviewed, multilevel disc disease, spinal stenosis worst L3-L5, there is a large disc extrusion with caudal migration at the L4-L5 level causing moderate to severe central canal stenosis.  Brief History, Exam, Impression, and Recommendations:    Lumbar spondylosis Multilevel lumbar spinal stenosis worst L4-L5, she has had some epidurals, facet injections, medial branch blocks, radiofrequency ablation, she is currently taking Lyrica  200 mg 3 times daily. She has had physical therapy. Today she is here to go over MRI results, the results are dictated above. Think we can go ahead and try a right sided L4-L5 interlaminar epidural, but considering the size of the disc extrusion with migration I would like Dr. Jackee Marus to weigh in. She can see me back approximately 6 weeks after the epidural injection.    ____________________________________________ Joselyn Nicely. Sandy Crumb, M.D., ABFM., CAQSM., AME. Primary Care and Sports Medicine [facility]  Adjunct Professor of [facility] Medicine  [employer]  Restaurant manager, fast food

## 2024-05-30 NOTE — Assessment & Plan Note (Addendum)
 Multilevel lumbar spinal stenosis worst L4-L5, she has had some epidurals, facet injections, medial branch blocks, radiofrequency ablation, she is currently taking Lyrica  200 mg 3 times daily. She has had physical therapy. Today she is here to go over MRI results, the results are dictated above. Think we can go ahead and try a right sided L4-L5 interlaminar epidural, but considering the size of the disc extrusion with migration I would like Dr. Jackee Marus to weigh in. She can see me back approximately 6 weeks after the epidural injection.

## 2024-05-31 ENCOUNTER — Encounter: Payer: MEDICAID | Admitting: Physical Therapy

## 2024-06-01 ENCOUNTER — Ambulatory Visit: Payer: MEDICAID | Admitting: Physical Therapy

## 2024-06-02 ENCOUNTER — Encounter (HOSPITAL_COMMUNITY): Payer: Self-pay | Admitting: Psychiatry

## 2024-06-02 ENCOUNTER — Telehealth (HOSPITAL_COMMUNITY): Payer: MEDICAID | Admitting: Psychiatry

## 2024-06-02 DIAGNOSIS — F411 Generalized anxiety disorder: Secondary | ICD-10-CM | POA: Diagnosis not present

## 2024-06-02 DIAGNOSIS — F900 Attention-deficit hyperactivity disorder, predominantly inattentive type: Secondary | ICD-10-CM | POA: Diagnosis not present

## 2024-06-02 DIAGNOSIS — F319 Bipolar disorder, unspecified: Secondary | ICD-10-CM

## 2024-06-02 MED ORDER — BUSPIRONE HCL 10 MG PO TABS: 10.0000 mg | ORAL_TABLET | Freq: Three times a day (TID) | ORAL | 3 refills | Status: DC

## 2024-06-02 MED ORDER — ARIPIPRAZOLE 15 MG PO TABS: 15.0000 mg | ORAL_TABLET | Freq: Every day | ORAL | 3 refills | Status: DC

## 2024-06-02 MED ORDER — BUPROPION HCL ER (XL) 300 MG PO TB24: 300.0000 mg | ORAL_TABLET | Freq: Every day | ORAL | 3 refills | Status: DC

## 2024-06-02 NOTE — Progress Notes (Signed)
 BH MD/PA/NP OP Progress Note Virtual Visit via Video Note  I connected with Frances [name] on 06/02/24 at  3:30 PM EDT by a video enabled telemedicine application and verified that I am speaking with the correct person using two identifiers.  Location: Patient: Home Provider: Clinic   I discussed the limitations of evaluation and management by telemedicine and the availability of in person appointments. The patient expressed understanding and agreed to proceed.  I provided 30 minutes of non-face-to-face time during this encounter.              06/02/2024 10:13 AM Frances [name]  MRN:  [id number]  Chief Complaint: I am concerned about everything  HPI: 48 year old female seen today for follow up psychiatric evaluation. She has a psychiatric history of bipolar 1, borderline personality disorder, depression, and anxiety. She is currently managed on Abilify  15 mg nightly and Wellbutrin  300 we will mg daily and Cymbalta  90 mg daily (from rheumatologist). Today, patient notes that medications are effective in managing her symptoms.   Today the patient was well-groomed, pleasant, cooperative, engaged in eye contact, and engaged in conversation. She informed Clinical research associate that she is concerned about everything. Recently she notes that her landlord told her that her tenant has to leave.  Patient notes that her tenets refuses.  She recently went to court to get and eviction notice and Archivist that the courts sided with her.  Patient reports that her tenets has 10 days to move out.     Patient also informed writer that recently she was told that she has a herniated disc.  She informed Clinical research associate that her MD would like her to have spinal injections and reports that she received her next one on the 23rd.  She also speaks to a surgeon next week Friday to discuss possible surgery options.  Today she quantifies her pain as 6 out of 10.  She does note that she takes hydrocodone  which is  somewhat effective for managing her pain.    Patient reports that her legal issues exacerbate her mental health.  Recently she reports that she has been more anxious and depressed. Today provider conducted a GAD-7 and patient scored a 8, at her last visit she scored a 4.  Provider also conducted PHQ-9 patient scored a 11, her last visit she scored an 9.  She endorsed adequate sleep and increased appetite.  She reports that she has gained 10 pounds since her last.  Today she endorses passive SI but denies wanting to harm herself.  She denies SI/HI/AVH, mania, paranoia.    Patient requested medication to help manage her anxiety.  Provider offered patient hydroxyzine  however she has not been effective in the past.  Today she is agreeable to starting BuSpar 10 mg 3 times daily to help manage anxiety and depression.  She will continue other medications as prescribed.  Potential side effects of medication and risks vs benefits of treatment vs non-treatment were explained and discussed. All questions were answered. No medication concerns noted at this time.      Visit Diagnosis:    ICD-10-CM   1. Generalized anxiety disorder  F41.1 busPIRone (BUSPAR) 10 MG tablet    2. Bipolar I disorder (HCC)  F31.9 buPROPion  (WELLBUTRIN  XL) 300 MG 24 hr tablet    ARIPiprazole  (ABILIFY ) 15 MG tablet    3. Attention deficit hyperactivity disorder (ADHD), predominantly inattentive type  F90.0 buPROPion  (WELLBUTRIN  XL) 300 MG 24 hr tablet  Past Psychiatric History: Bipolar, anxiety, and depression Past Medical History:  Past Medical History:  Diagnosis Date   Anxiety    Arthritis    Back pain, chronic    Bipolar 1 disorder (HCC)    Chronic fatigue syndrome    DDD (degenerative disc disease), lumbar    Depression    Diabetes mellitus without complication (HCC)    Fibromyalgia    IUD 2012   Mirena   MTHFR gene mutation    Rheumatoid arteritis (HCC)     Past Surgical History:  Procedure  Laterality Date   BREAST REDUCTION SURGERY Bilateral 01/07/2024   Procedure: Bilateral breast reduction with liposuction;  Surgeon: Thornell Flirt, DO;  Location: [facility];  Service: Plastics;  Laterality: Bilateral;   COLONOSCOPY WITH PROPOFOL  N/A 02/25/2017   Procedure: COLONOSCOPY WITH PROPOFOL ;  Surgeon: Evangeline Hilts, MD;  Location: [facility];  Service: Endoscopy;  Laterality: N/A;   left knee lateral release  1993   scar tisue removal  03/2005   left ankle   TONSILLECTOMY  age 27's    Family Psychiatric History: Maternal aunts Bipolar disorder and uncle alcoholic  Family History:  Family History  Problem Relation Age of Onset   Diabetes Mother    Stroke Mother    Lupus Mother    Hypertension Mother    Congestive Heart Failure Mother    Mental illness Brother        Not clear what his diagnosis is--may be related to previous drug use.   Cancer Maternal Grandmother        colon   Diabetes Maternal Grandfather    Depression Maternal Uncle    Alcohol abuse Maternal Uncle     Social History:  Social History   Socioeconomic History   Marital status: Single    Spouse name: Not on file   Number of children: 0   Years of education: Not on file   Highest education level: Bachelor's degree (e.g., BA, AB, BS)  Occupational History   Occupation: Airline pilot at times  Tobacco Use   Smoking status: Never   Smokeless tobacco: Never  Vaping Use   Vaping status: Never Used  Substance and Sexual Activity   Alcohol use: No    Alcohol/week: 0.0 standard drinks of alcohol   Drug use: No   Sexual activity: Not on file  Other Topics Concern   Not on file  Social History Narrative   Originally from [place] , outside of [place].    Moved here permanently 2012.   Intermittently worked on a farm.   Lives with many cats--3 plus fosters cats.    Single, lives alone in a one story home. Rarely drinks caffeine . 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   Social Drivers of Health   Financial Resource Strain: Medium Risk (06/12/2023)   Overall Financial Resource Strain (CARDIA)    Difficulty of Paying Living Expenses: Somewhat hard  Food Insecurity: Food Insecurity Present (06/12/2023)   Hunger Vital Sign    Worried About Running Out of Food in the Last Year: Sometimes true    Ran Out of Food in the Last Year: Never true  Transportation Needs: No Transportation Needs (06/12/2023)   PRAPARE - Administrator, Civil Service (Medical): No    Lack of Transportation (Non-Medical): No  Physical Activity: Insufficiently Active (07/27/2023)   Exercise Vital Sign    Days of Exercise per Week: 3 days    Minutes of Exercise  per Session: 30 min  Stress: No Stress Concern Present (06/12/2023)   [employer] of Occupational Health - Occupational Stress Questionnaire    Feeling of Stress : Only a little  Social Connections: Socially Isolated (06/12/2023)   Social Connection and Isolation Panel    Frequency of Communication with Friends and Family: Never    Frequency of Social Gatherings with Friends and Family: Twice a week    Attends Religious Services: Never    Database administrator or Organizations: Yes    Attends Engineer, structural: More than 4 times per year    Marital Status: Never married    Allergies:  Allergies  Allergen Reactions   Metaxalone Hives    Metabolic Disorder Labs: Lab Results  Component Value Date   HGBA1C 6.0 (A) 03/08/2024   MPG 103 04/14/2018   Lab Results  Component Value Date   PROLACTIN 16.4 09/02/2023   Lab Results  Component Value Date   CHOL 132 09/02/2023   TRIG 201 (H) 09/02/2023   HDL 39 (L) 09/02/2023   CHOLHDL 3.4 09/02/2023   LDLCALC 60 09/02/2023   LDLCALC 41 09/10/2022   Lab Results  Component Value Date   TSH 1.030 09/02/2023   TSH 0.866 11/21/2020    Therapeutic Level Labs: No results found for: LITHIUM No results found for:  VALPROATE No results found for: CBMZ  Current Medications: Current Outpatient Medications  Medication Sig Dispense Refill   busPIRone (BUSPAR) 10 MG tablet Take 1 tablet (10 mg total) by mouth 3 (three) times daily. 90 tablet 3   albuterol  (VENTOLIN  HFA) 108 (90 Base) MCG/ACT inhaler Take 2 puffs 15 minutes before exercise and as needed for shortness of breath. 6.7 g 1   ARIPiprazole  (ABILIFY ) 15 MG tablet Take 1 tablet (15 mg total) by mouth daily. 30 tablet 3   atorvastatin  (LIPITOR) 20 MG tablet Take 1 tablet (20 mg total) by mouth daily. 90 tablet 0   Azelastine  HCl 137 MCG/SPRAY SOLN 2 SPRAY IN ALTERNATE NOSTRILS TWICE A DAY 30 mL 0   blood glucose meter kit and supplies KIT Dispense based on patient and insurance preference. Use up to four times daily as directed. 1 each 0   buPROPion  (WELLBUTRIN  XL) 300 MG 24 hr tablet Take 1 tablet (300 mg total) by mouth daily. 30 tablet 3   cholecalciferol (VITAMIN D3) 25 MCG (1000 UT) tablet Take 1,000 Units by mouth daily.     diclofenac  (VOLTAREN ) 75 MG EC tablet Take 1 tablet (75 mg total) by mouth 2 (two) times daily. 60 tablet 0   diclofenac  Sodium (VOLTAREN ) 1 % GEL Apply 2 g topically 4 (four) times daily. To affected joint. 100 g 11   DULoxetine  HCl 30 MG CSDR Take 90 mg by mouth daily.     fluticasone  (FLONASE ) 50 MCG/ACT nasal spray 2 SPRAYS IN EACH NOSTRIL DAILY 16 g 0   HYDROcodone -acetaminophen  (NORCO/VICODIN) 5-325 MG tablet Take 1 tablet by mouth 2 (two) times daily as needed for moderate pain (pain score 4-6) (back pain). 60 tablet 0   hydroxychloroquine (PLAQUENIL) 200 MG tablet Take 200 mg by mouth 2 (two) times daily. 200mg  twice daily on weekdays and 200mg  once daily on weekends as of 12/03/22 review     levocetirizine (XYZAL ) 5 MG tablet TAKE 1 TABLET BY MOUTH EVERY EVENING 34 tablet 1   metFORMIN  (GLUCOPHAGE ) 1000 MG tablet Take 1 tablet (1,000 mg total) by mouth 2 (two) times daily with a meal. 180 tablet  0   montelukast   (SINGULAIR ) 10 MG tablet Take 1 tablet (10 mg total) by mouth at bedtime. 90 tablet 3   pregabalin  (LYRICA ) 200 MG capsule TAKE 1 CAPSULE BY MOUTH 2-3 TIMES DAILY 90 capsule 3   Semaglutide , 1 MG/DOSE, 4 MG/3ML SOPN Inject 1 mg into the skin once a week. 3 mL 0   solifenacin  (VESICARE ) 10 MG tablet Take 1 tablet (10 mg total) by mouth daily. 30 tablet 11   tiZANidine  (ZANAFLEX ) 4 MG capsule Take 1 capsule (4 mg total) by mouth 3 (three) times daily. 90 capsule 0   No current facility-administered medications for this visit.     Musculoskeletal: Strength & Muscle Tone: within normal limits and  telehealth visit Gait & Station: normal, telehealth visit Patient leans: N/A  Psychiatric Specialty Exam: Review of Systems  There were no vitals taken for this visit.There is no height or weight on file to calculate BMI.  General Appearance: Well Groomed  Eye Contact:  Good  Speech:  Clear and Coherent and Normal Rate  Volume:  Normal  Mood:  Anxious, Depressed, and mild  Affect:  Congruent  Thought Process:  Coherent, Goal Directed and Linear  Orientation:  Full (Time, Place, and Person)  Thought Content: WDL and Logical   Suicidal Thoughts:  No  Homicidal Thoughts:  No  Memory:  Immediate;   Good Recent;   Good Remote;   Good  Judgement:  Good  Insight:  Good  Psychomotor Activity:  Normal  Concentration:  Concentration: Good and Attention Span: Good  Recall:  Good  Fund of Knowledge: Good  Language: Good  Akathisia:  No  Handed:  Right  AIMS (if indicated): not done  Assets:  Communication Skills Desire for Improvement Financial Resources/Insurance Housing Social Support  ADL's:  Intact  Cognition: WNL  Sleep:  Good   Screenings: AIMS    Flowsheet Row Clinical Support from 06/30/2023 in [facility]  AIMS Total Score 5 0   AUDIT    Flowsheet Row Admission  ([facility]charged[facility]  Alcohol Use Disorder Identification Test Final Score (AUDIT) 0   GAD-7    Flowsheet Row Video Visit from 06/02/2024 in [facility] Video Visit from 04/13/2024 in [facility] Video Visit from 01/21/2024 in [facility] Office Visit from 09/11/2023 in [facility] at [facility] Video Visit from 09/10/2023 in [facility]  Total GAD-7 Score 8 4 1 2 3    PHQ2-9    Flowsheet Row Video Visit from 06/02/2024 in [facility] Video Visit from 04/13/2024 in [facility] Video Visit from 01/21/2024 in [facility] Office Visit from 09/11/2023 in [facility] Medicine at [facility] Video Visit from 09/10/2023 in [facility]  PHQ-2 Total Score 3 2 2 2 1   PHQ-9 Total Score 11 9 8 9 8    Flowsheet Row Video Visit from 06/02/2024 in [facility] UC from 05/02/2024 in [facility] [place] [facility] from 04/11/2024 in [facility] at [facility] RISK CATEGORY Error: Q7 should not be populated when Q6 is No No Risk No Risk     Assessment and Plan: Patient notes she has been more  anxious and depressed due to stressors with her roommate/tenet.  Patient requested medication to help manage her anxiety.  Provider offered patient hydroxyzine  however she has not been effective in the past.  Today she is agreeable to starting BuSpar 10 mg 3 times daily to help manage anxiety and depression.  She will continue other medications as prescribed.   1. Bipolar I disorder (HCC)  Continue- buPROPion  (WELLBUTRIN  XL) 300 MG 24 hr tablet; Take 1 tablet (300 mg total) by mouth daily.  Dispense: 30 tablet; Refill:  3 Continue- ARIPiprazole  (ABILIFY ) 15 MG tablet; Take 1 tablet (15 mg total) by mouth daily.  Dispense: 30 tablet; Refill: 3  2. Attention deficit hyperactivity disorder (ADHD), predominantly inattentive type  Continue- buPROPion  (WELLBUTRIN  XL) 300 MG 24 hr tablet; Take 1 tablet (300 mg total) by mouth daily.  Dispense: 30 tablet; Refill: 3  3. Generalized anxiety disorder (Primary)  Start- busPIRone (BUSPAR) 10 MG tablet; Take 1 tablet (10 mg total) by mouth 3 (three) times daily.  Dispense: 90 tablet; Refill: 3   Follow up in 3 months Follow up with therapy   Arlyne Bering, NP 06/02/2024, 10:13 AM

## 2024-06-03 ENCOUNTER — Other Ambulatory Visit: Payer: Self-pay | Admitting: Physician Assistant

## 2024-06-06 ENCOUNTER — Encounter: Payer: Self-pay | Admitting: Sports Medicine

## 2024-06-06 ENCOUNTER — Ambulatory Visit: Payer: Self-pay | Admitting: Sports Medicine

## 2024-06-07 ENCOUNTER — Encounter: Payer: MEDICAID | Admitting: Physical Therapy

## 2024-06-07 ENCOUNTER — Ambulatory Visit (INDEPENDENT_AMBULATORY_CARE_PROVIDER_SITE_OTHER): Payer: MEDICAID | Admitting: Physician Assistant

## 2024-06-07 ENCOUNTER — Encounter: Payer: Self-pay | Admitting: Physician Assistant

## 2024-06-07 VITALS — BP 113/69 | HR 69 | Ht 67.0 in | Wt 173.0 lb

## 2024-06-07 DIAGNOSIS — M47816 Spondylosis without myelopathy or radiculopathy, lumbar region: Secondary | ICD-10-CM

## 2024-06-07 DIAGNOSIS — Z7985 Long-term (current) use of injectable non-insulin antidiabetic drugs: Secondary | ICD-10-CM

## 2024-06-07 DIAGNOSIS — E1169 Type 2 diabetes mellitus with other specified complication: Secondary | ICD-10-CM

## 2024-06-07 DIAGNOSIS — M0609 Rheumatoid arthritis without rheumatoid factor, multiple sites: Secondary | ICD-10-CM

## 2024-06-07 DIAGNOSIS — G894 Chronic pain syndrome: Secondary | ICD-10-CM | POA: Diagnosis not present

## 2024-06-07 DIAGNOSIS — M545 Low back pain, unspecified: Secondary | ICD-10-CM

## 2024-06-07 DIAGNOSIS — M51369 Other intervertebral disc degeneration, lumbar region without mention of lumbar back pain or lower extremity pain: Secondary | ICD-10-CM

## 2024-06-07 DIAGNOSIS — M51362 Other intervertebral disc degeneration, lumbar region with discogenic back pain and lower extremity pain: Secondary | ICD-10-CM

## 2024-06-07 DIAGNOSIS — Z7984 Long term (current) use of oral hypoglycemic drugs: Secondary | ICD-10-CM

## 2024-06-07 DIAGNOSIS — R339 Retention of urine, unspecified: Secondary | ICD-10-CM | POA: Diagnosis not present

## 2024-06-07 DIAGNOSIS — G8929 Other chronic pain: Secondary | ICD-10-CM

## 2024-06-07 DIAGNOSIS — F319 Bipolar disorder, unspecified: Secondary | ICD-10-CM

## 2024-06-07 DIAGNOSIS — F900 Attention-deficit hyperactivity disorder, predominantly inattentive type: Secondary | ICD-10-CM

## 2024-06-07 DIAGNOSIS — M25551 Pain in right hip: Secondary | ICD-10-CM

## 2024-06-07 MED ORDER — TAMSULOSIN HCL 0.4 MG PO CAPS: 0.4000 mg | ORAL_CAPSULE | Freq: Every day | ORAL | 1 refills | Status: AC

## 2024-06-07 MED ORDER — METFORMIN HCL 1000 MG PO TABS
1000.0000 mg | ORAL_TABLET | Freq: Two times a day (BID) | ORAL | 0 refills | Status: DC
Start: 2024-06-07 — End: 2024-09-07

## 2024-06-07 MED ORDER — HYDROCODONE-ACETAMINOPHEN 5-325 MG PO TABS: ORAL_TABLET | ORAL | 0 refills | Status: DC

## 2024-06-07 NOTE — Progress Notes (Signed)
 Established Patient Office Visit  Subjective   Patient ID: Frances [name], female    DOB: 1976-03-27  Age: 48 y.o. MRN: [id number]  Chief Complaint  Patient presents with   Medical Management of Chronic Issues    Last A1c was 6.0     HPI Pt is a 48 yo female with T2DM,   Pt is not checking her sugars. She is compliant with medications. She is trying to stay active but her chronic low back pain is worsening and making it harder to move. She is seeing Dr. Jackee Marus to consider other interventions. She denies any CP, palpitations, headaches or vision changes. She has lost 11lbs in the last 3 months. She had breast reduction in January that went well.   She has started taking a little more norco 1 and 1/2 tablets at times and needs pain contract updated.   She does report urinary retention. Hard to get started urinating and flow is weaker. She has seen urology before and tried on medications but have not been helpful.   Review of Systems  All other systems reviewed and are negative.     Objective:     BP 113/69   Pulse 69   Ht 5' 7 (1.702 m)   Wt 173 lb (78.5 kg)   SpO2 99%   BMI 27.10 kg/m  BP Readings from Last 3 Encounters:  06/07/24 113/69  05/02/24 96/75  04/11/24 134/87   Wt Readings from Last 3 Encounters:  06/07/24 173 lb (78.5 kg)  03/21/24 184 lb (83.5 kg)  03/08/24 188 lb 4 oz (85.4 kg)      Physical Exam Constitutional:      Appearance: Normal appearance.  HENT:     Head: Normocephalic.   Cardiovascular:     Rate and Rhythm: Normal rate and regular rhythm.  Pulmonary:     Effort: Pulmonary effort is normal.     Breath sounds: Normal breath sounds.   Musculoskeletal:     Right lower leg: No edema.     Left lower leg: No edema.   Neurological:     General: No focal deficit present.     Mental Status: She is alert and oriented to person, place, and time.   Psychiatric:        Mood and Affect: Mood normal.    .. Results for orders  placed or performed in visit on 06/07/24  POCT HgB A1C   Collection Time: 06/08/24 12:03 PM  Result Value Ref Range   Hemoglobin A1C 5.5 4.0 - 5.6 %   HbA1c POC (<> result, manual entry)     HbA1c, POC (prediabetic range)     HbA1c, POC (controlled diabetic range)      The 10-year ASCVD risk score (Arnett DK, et al., 2019) is: 1.6%    Assessment & Plan:  Frances AasAaron AasAlleene [name] was seen today for medical management of chronic issues.  Diagnoses and all orders for this visit:  Type 2 diabetes mellitus with other specified complication, without long-term current use of insulin (HCC) -     metFORMIN  (GLUCOPHAGE ) 1000 MG tablet; Take 1 tablet (1,000 mg total) by mouth 2 (two) times daily with a meal. -     Semaglutide , 1 MG/DOSE, (OZEMPIC , 1 MG/DOSE,) 4 MG/3ML SOPN; Inject 1 mg into the skin once a week.  Urinary retention -     tamsulosin (FLOMAX) 0.4 MG CAPS capsule; Take 1 capsule (0.4 mg total) by mouth daily.  Chronic pain syndrome -  HYDROcodone -acetaminophen  (NORCO/VICODIN) 5-325 MG tablet; Take one to one-half tablet every 12 hours for chronic pain.  Bipolar I disorder (HCC)  Attention deficit hyperactivity disorder (ADHD), predominantly inattentive type  Rheumatoid arthritis 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 (HCC) -     HYDROcodone -acetaminophen  (NORCO/VICODIN) 5-325 MG tablet; Take one to one-half tablet every 12 hours for chronic pain.  Chronic right-sided low back pain without sciatica -     HYDROcodone -acetaminophen  (NORCO/VICODIN) 5-325 MG tablet; Take one to one-half tablet every 12 hours for chronic pain.  Chronic right hip pain -     HYDROcodone -acetaminophen  (NORCO/VICODIN) 5-325 MG tablet; Take one to one-half tablet every 12 hours for chronic pain.  DDD (degenerative disc disease), lumbar -     HYDROcodone -acetaminophen  (NORCO/VICODIN) 5-325 MG tablet; Take one to one-half tablet every 12 hours for chronic pain.  Lumbar spondylosis -      HYDROcodone -acetaminophen  (NORCO/VICODIN) 5-325 MG tablet; Take one to one-half tablet every 12 hours for chronic pain.  Other orders -     Cancel: Zoster Recombinant (Shingrix )   A1C to goal at 5.5! Continue on metformin  and ozempic  Continue to stay active with goal of 150 minutes of exercise BP to goal On statin Needs to call and schedule eye exam Foot exam UTD Pt declined covid vaccine Needs to get shingles vaccine at pharmacy Follow up in 3 months  Pain worsening and seeing orthopedist Dr. Jackee Marus to consider more intervention Updated pain contract to 1 and 1/2 tablet twice a day .Frances AasPDMP reviewed during this encounter. No concerns Follow up in 3 months  Trial of flomax for urinary retention to see if helpful with symptoms Follow up with urology  Sandy Crumb, PA-C

## 2024-06-08 ENCOUNTER — Encounter
Admit: 2024-06-08 | Discharge: 2024-06-09 | Payer: Medicaid (Managed Care) | Attending: Student in an Organized Health Care Education/Training Program | Primary: Student in an Organized Health Care Education/Training Program

## 2024-06-08 ENCOUNTER — Ambulatory Visit
Admit: 2024-06-08 | Discharge: 2024-06-09 | Payer: Medicaid (Managed Care) | Attending: Student in an Organized Health Care Education/Training Program | Primary: Student in an Organized Health Care Education/Training Program

## 2024-06-08 ENCOUNTER — Encounter: Payer: Self-pay | Admitting: Physician Assistant

## 2024-06-08 ENCOUNTER — Ambulatory Visit: Payer: MEDICAID | Admitting: Physician Assistant

## 2024-06-08 DIAGNOSIS — M51362 Other intervertebral disc degeneration, lumbar region with discogenic back pain and lower extremity pain: Secondary | ICD-10-CM | POA: Insufficient documentation

## 2024-06-08 DIAGNOSIS — M0600 Rheumatoid arthritis without rheumatoid factor, unspecified site: Principal | ICD-10-CM

## 2024-06-08 DIAGNOSIS — M797 Fibromyalgia: Principal | ICD-10-CM

## 2024-06-08 LAB — POCT GLYCOSYLATED HEMOGLOBIN (HGB A1C): Hemoglobin A1C: 5.5 % (ref 4.0–5.6)

## 2024-06-08 MED ORDER — OZEMPIC (1 MG/DOSE) 4 MG/3ML ~~LOC~~ SOPN: 1.0000 mg | PEN_INJECTOR | SUBCUTANEOUS | 0 refills | Status: DC

## 2024-06-10 ENCOUNTER — Ambulatory Visit (HOSPITAL_COMMUNITY): Payer: MEDICAID | Admitting: Mental Health

## 2024-06-10 DIAGNOSIS — F319 Bipolar disorder, unspecified: Secondary | ICD-10-CM

## 2024-06-10 DIAGNOSIS — F603 Borderline personality disorder: Secondary | ICD-10-CM | POA: Diagnosis not present

## 2024-06-10 NOTE — Discharge Instructions (Signed)

## 2024-06-10 NOTE — Progress Notes (Signed)
   THERAPIST PROGRESS NOTE Virtual Visit via Video Note  I connected with Frances [name] on 06/10/24 at 11:00 AM EDT by a video enabled telemedicine application and verified that I am speaking with the correct person using two identifiers.  Location: Patient: home address on file Provider: home office   I discussed the limitations of evaluation and management by telemedicine and the availability of in person appointments. The patient expressed understanding and agreed to proceed.  I discussed the assessment and treatment plan with the patient. The patient was provided an opportunity to ask questions and all were answered. The patient agreed with the plan and demonstrated an understanding of the instructions.   The patient was advised to call back or seek an in-person evaluation if the symptoms worsen or if the condition fails to improve as anticipated.  I provided 24 minutes of non-face-to-face time during this encounter.   Loman Risk, [facility]   Session Time: 11:04 am ( 24 minutes)  Participation Level: Active  Behavioral Response: CasualAlertDysphoric  Type of Therapy: Individual Therapy  Treatment Goals addressed: STG: [name] will increase control over thoughts AEB ability to engage in x 3 coping skills with ability to reframe thoughts as needed within the next 90 days.     ProgressTowards Goals: Progressing  Interventions: Supportive  Summary: [name] is a 48 y.o. female who presents with dx of Bipolar disorder, borderline personality disorder, GAD and ADHD. Presents for session alert and oriented; mood and affect blunted; neutral but stable. Speech clear and coherent at normal rate and tone. Denies excessive highs or lows and reports feeling of control over her thoughts. Shares recent stressor with having to present to court in order to have roommate vacate her home. Shares hx of friend not supposed to be staying long term but then refused to leave.  Shares with therapist various incidents that took place in which having friend her home was increasing stress and feelings of depression and anxiety. Shares currently for moods to be stable with concern for possible need for surgery. 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 Agrees to present for in person session due to ongoing poor signal with virtual sessions   Suicidal/Homicidal: Nowithout intent/plan  Therapist Response: Therapist engaged [name] in tele-therapy session. Completed check in and reviewed current level of functioning, sxs management and level of stressors. Provided supportive encouragement validated feelings. Engaged [name] in exploring ability manage stressors and hving roommate removed from to the home. Explored presence of concerns upon having fiend move in from start. Supported in processing medical concerns and encouraged writing down questions and concerns to ask during upcoming doctor visit. Educated on need for in person session with hx of ongoing poor virtual session quality. Reviewed session and provided follow up.   Plan: Return again in  x 6 weeks.  Diagnosis: Borderline personality disorder (HCC)  Bipolar I disorder (HCC)  Collaboration of Care: Other None  Patient/Guardian was advised Release of Information must be obtained prior to any record release in order to collaborate their care with an outside provider. Patient/Guardian was advised if they have not already done so to contact the registration department to sign all necessary forms in order for us  to release information regarding their care.   Consent: Patient/Guardian gives verbal consent for treatment and assignment of benefits for services provided during this visit. Patient/Guardian expressed understanding and agreed to proceed.   Carmel Chimes [place], [facility] 06/10/2024

## 2024-06-13 ENCOUNTER — Inpatient Hospital Stay
Admission: RE | Admit: 2024-06-13 | Discharge: 2024-06-13 | Disposition: A | Discharge status: Home / Self Care | Payer: MEDICAID | Source: Ambulatory Visit | Attending: Sports Medicine | Admitting: Sports Medicine

## 2024-06-13 ENCOUNTER — Encounter: Payer: Self-pay | Admitting: Sports Medicine

## 2024-06-13 NOTE — Discharge Instructions (Signed)

## 2024-06-14 ENCOUNTER — Ambulatory Visit
Admission: RE | Admit: 2024-06-14 | Discharge: 2024-06-14 | Disposition: A | Discharge status: Home / Self Care | Payer: MEDICAID | Source: Ambulatory Visit | Attending: Sports Medicine | Admitting: Sports Medicine

## 2024-06-14 ENCOUNTER — Encounter: Payer: MEDICAID | Admitting: Physical Therapy

## 2024-06-14 DIAGNOSIS — M47816 Spondylosis without myelopathy or radiculopathy, lumbar region: Secondary | ICD-10-CM

## 2024-06-14 MED ORDER — IOPAMIDOL (ISOVUE-M 200) INJECTION 41%
1.0000 mL | Freq: Once | INTRAMUSCULAR | Status: AC
  Administered 2024-06-14: 1 mL via EPIDURAL

## 2024-06-14 MED ORDER — METHYLPREDNISOLONE ACETATE 40 MG/ML INJ SUSP (RADIOLOG
80.0000 mg | Freq: Once | INTRAMUSCULAR | Status: AC
  Administered 2024-06-14: 80 mg via EPIDURAL

## 2024-06-17 ENCOUNTER — Telehealth: Payer: Self-pay | Admitting: Physician Assistant

## 2024-06-17 NOTE — Telephone Encounter (Unsigned)
 Copied from CRM [id number]. Topic: Clinical - Medication Refill >> Jun 17, 2024 10:46 AM Alfonso ORN wrote: Medication:  tiZANidine  (ZANAFLEX ) 4 MG capsule        Has the patient contacted their pharmacy? No (Agent: If no, request that the patient contact the pharmacy for the refill. If patient does not wish to contact the pharmacy document the reason why and proceed with request.) (Agent: If yes, when and what did the pharmacy advise?)  This is the patient's preferred pharmacy:  [facility], [place] - [address] [place] [address] Phone: [phone] Fax: [phone]   Is this the correct pharmacy for this prescription? Yes If no, delete pharmacy and type the correct one.   Has the prescription been filled recently? Yes  Is the patient out of the medication? No  Has the patient been seen for an appointment in the last year OR does the patient have an upcoming appointment? Yes  Can we respond through MyChart? Yes  Agent: Please be advised that Rx refills may take up to 3 business days. We ask that you follow-up with your pharmacy.

## 2024-06-20 ENCOUNTER — Other Ambulatory Visit: Payer: Self-pay | Admitting: Physician Assistant

## 2024-06-21 ENCOUNTER — Other Ambulatory Visit (HOSPITAL_COMMUNITY): Payer: Self-pay

## 2024-06-21 ENCOUNTER — Encounter: Payer: MEDICAID | Admitting: Physical Therapy

## 2024-06-21 ENCOUNTER — Telehealth: Payer: Self-pay

## 2024-06-21 MED ORDER — TIZANIDINE HCL 4 MG PO TABS: 4.0000 mg | ORAL_TABLET | Freq: Four times a day (QID) | ORAL | 0 refills | Status: DC | PRN

## 2024-06-21 NOTE — Telephone Encounter (Signed)
 No prior middle will change to tabs.  Meds ordered this encounter  Medications   tiZANidine  (ZANAFLEX ) 4 MG tablet    Sig: Take 1 tablet (4 mg total) by mouth every 6 (six) hours as needed for muscle spasms.    Dispense:  90 tablet    Refill:  0

## 2024-06-21 NOTE — Telephone Encounter (Signed)
 Pharmacy Patient Advocate Encounter   Received notification from CoverMyMeds that prior authorization for Ozempic  4mg /48ml is due for renewal.   Insurance verification completed.   The patient is insured through [facility].  Action: PA required; PA submitted to above mentioned insurance via CoverMyMeds Key/confirmation #/EOC [facility] Status is pending

## 2024-06-21 NOTE — Telephone Encounter (Signed)
 Pharmacy Patient Advocate Encounter   Received notification from CoverMyMeds that prior authorization for tiZANidine  HCl 4MG  capsules is required/requested.   Insurance verification completed.   The patient is insured through [facility] .   Per test claim:  TIZANIDINE  TABLET is preferred by the insurance.  If suggested medication is appropriate, Please send in a new RX and discontinue this one. If not, please advise as to why it's not appropriate so that we may request a Prior Authorization. Please note, some preferred medications may still require a PA.  If the suggested medications have not been trialed and there are no contraindications to their use, the PA will not be submitted, as it will not be approved.

## 2024-06-22 NOTE — Telephone Encounter (Signed)
 Pharmacy Patient Advocate Encounter  Received notification from [facility] that Prior Authorization for Ozempic  4mg /33ml has been APPROVED from 06/21/24 to 06/21/25

## 2024-06-27 ENCOUNTER — Other Ambulatory Visit: Payer: Self-pay | Admitting: Physician Assistant

## 2024-06-27 DIAGNOSIS — J3489 Other specified disorders of nose and nasal sinuses: Secondary | ICD-10-CM

## 2024-06-27 DIAGNOSIS — J310 Chronic rhinitis: Secondary | ICD-10-CM

## 2024-06-27 DIAGNOSIS — R0989 Other specified symptoms and signs involving the circulatory and respiratory systems: Secondary | ICD-10-CM

## 2024-07-09 ENCOUNTER — Ambulatory Visit
Admission: EM | Admit: 2024-07-09 | Discharge: 2024-07-09 | Disposition: A | Discharge status: Home / Self Care | Payer: MEDICAID | Attending: Family Medicine | Admitting: Family Medicine

## 2024-07-09 ENCOUNTER — Other Ambulatory Visit: Payer: Self-pay

## 2024-07-09 DIAGNOSIS — L03039 Cellulitis of unspecified toe: Secondary | ICD-10-CM | POA: Diagnosis not present

## 2024-07-09 DIAGNOSIS — L980 Pyogenic granuloma: Secondary | ICD-10-CM | POA: Diagnosis not present

## 2024-07-09 MED ORDER — CEPHALEXIN 500 MG PO CAPS: 500.0000 mg | ORAL_CAPSULE | Freq: Two times a day (BID) | ORAL | 0 refills | Status: DC

## 2024-07-09 NOTE — ED Notes (Signed)
 Soaking foot in wound cleanser and warm water in basin

## 2024-07-09 NOTE — Discharge Instructions (Signed)
 Take Keflex  2 times a day Continue warm soaks daily See your doctor if not improving by next week May need referral to podiatry for toenail surgery

## 2024-07-09 NOTE — ED Provider Notes (Signed)
 [facility]    CSN: [id number] Arrival date & time: 07/09/24  9094      History   Chief Complaint Chief Complaint  Patient presents with   Toe Pain    HPI Frances [name] is a 48 y.o. female.   HPI Patient has chronic problems with ingrown toenail.  Currently has an ingrown toenail on her left foot with some swelling at the edge.  Here for evaluation.  Past Medical History:  Diagnosis Date   Anxiety    Arthritis    Back pain, chronic    Bipolar 1 disorder (HCC)    Chronic fatigue syndrome    DDD (degenerative disc disease), lumbar    Depression    Diabetes mellitus without complication (HCC)    Fibromyalgia    IUD 2012   Mirena   MTHFR gene mutation    Rheumatoid arteritis [facility])     Patient Active Problem List   Diagnosis Date Noted   Degeneration of intervertebral disc of lumbar region with discogenic back pain and lower extremity pain 06/08/2024   Urinary retention 06/07/2024   Carpal tunnel syndrome, bilateral 02/25/2024   Pain in left third proximal interphalangeal joint 10/16/2023   Personal history UTI 09/14/2023   Rheumatoid arthritis 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 (HCC) 09/14/2023   Urinary hesitancy 06/30/2023   Urinary frequency 06/16/2023   OAB (overactive bladder) 06/16/2023   Chronic pain syndrome 06/10/2023   Crush injury of right foot 05/25/2023   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   Microalbuminuria 09/09/2022   Night sweats 06/18/2022   Proteinuria 03/17/2022   Chronic right hip pain 01/06/2022   Impingement syndrome, shoulder, right 01/06/2022   ETD (Eustachian tube dysfunction), bilateral 08/23/2021   SOB (shortness of breath) on exertion 08/20/2021   Overweight (BMI 25.0-29.9) 06/18/2021   Attention deficit hyperactivity disorder (ADHD), 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   Hyperlipidemia LDL goal <70 03/18/2021   Diabetes mellitus (HCC) 03/13/2021   Seasonal allergies 12/25/2020    Symptomatic mammary hypertrophy 09/04/2020   Recurrent major depressive disorder, in partial remission (HCC) 05/24/2020   Generalized anxiety disorder 05/24/2020   DDD (degenerative disc disease), cervical 03/13/2020   Ingrown right big toenail 03/13/2020   Labral tear of right hip joint, degenerative 01/11/2020   Grief reaction 11/28/2019   Borderline personality disorder (HCC) 01/22/2018   Seronegative rheumatoid arthritis (HCC) 12/09/2017   New daily persistent headache 09/26/2017   MTHFR gene mutation 09/20/2017   Bipolar I disorder (HCC) 09/16/2017   Chronic fatigue syndrome 09/16/2017   Back pain 10/13/2008   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   Lumbar spondylosis 02/10/2008   FIBROMYALGIA 02/10/2008   Other specified abnormal findings of blood chemistry 02/10/2008    Past Surgical History:  Procedure Laterality Date   BREAST REDUCTION SURGERY Bilateral 01/07/2024   Procedure: Bilateral breast reduction with liposuction;  Surgeon: Lowery Estefana RAMAN, DO;  Location: [facility];  Service: Plastics;  Laterality: Bilateral;   COLONOSCOPY WITH PROPOFOL  N/A 02/25/2017   Procedure: COLONOSCOPY WITH PROPOFOL ;  Surgeon: Elsie Cree, MD;  Location: [facility];  Service: Endoscopy;  Laterality: N/A;   left knee lateral release  1993   scar tisue removal  03/2005   left ankle   TONSILLECTOMY  age 34's    OB History     Gravida  0   Para  0   Term  0   Preterm  0   AB  0   Living  0  SAB  0   IAB  0   Ectopic  0   Multiple  0   Live Births               Home Medications    Prior to Admission medications   Medication Sig Start Date End Date Taking? Authorizing Provider  cephALEXin  (KEFLEX ) 500 MG capsule Take 1 capsule (500 mg total) by mouth 2 (two) times daily. 07/09/24  Yes Maranda Jamee Jacob, MD  albuterol  (VENTOLIN  HFA) 108 [phone] Base) MCG/ACT inhaler Take 2 puffs 15 minutes before exercise and as needed for shortness of breath.  08/27/21   Breeback, Jade L, PA-C  ARIPiprazole  (ABILIFY ) 15 MG tablet Take 1 tablet (15 mg total) by mouth daily. 06/02/24   Harl Zane BRAVO, NP  atorvastatin  (LIPITOR) 20 MG tablet TAKE 1 TABLET BY MOUTH DAILY 06/23/24   Breeback, Jade L, PA-C  Azelastine  HCl 137 MCG/SPRAY SOLN USE 2 SPRAY IN ALTERNATE NOSTRILS TWICE A DAY 06/29/24   Breeback, Jade L, PA-C  blood glucose meter kit and supplies KIT Dispense based on patient and insurance preference. Use up to four times daily as directed. 05/29/21   Breeback, Jade L, PA-C  buPROPion  (WELLBUTRIN  XL) 300 MG 24 hr tablet Take 1 tablet (300 mg total) by mouth daily. 06/02/24   Harl Zane BRAVO, NP  busPIRone  (BUSPAR ) 10 MG tablet Take 1 tablet (10 mg total) by mouth 3 (three) times daily. 06/02/24   Harl Zane BRAVO, NP  cholecalciferol (VITAMIN D3) 25 MCG (1000 UT) tablet Take 1,000 Units by mouth daily.    [provider]  diclofenac  (VOLTAREN ) 75 MG EC tablet Take 1 tablet (75 mg total) by mouth 2 (two) times daily. 10/19/22   Maranda Jamee Jacob, MD  diclofenac  Sodium (VOLTAREN ) 1 % GEL Apply 2 g topically 4 (four) times daily. To affected joint. 10/16/23   Curtis Debby PARAS, MD  DULoxetine  HCl 30 MG CSDR Take 90 mg by mouth daily.    [provider]  fluticasone  (FLONASE ) 50 MCG/ACT nasal spray 2 SPRAY IN EACH NOSTRIL DAILY 06/23/24   Breeback, Jade L, PA-C  HYDROcodone -acetaminophen  (NORCO/VICODIN) 5-325 MG tablet Take one to one-half tablet every 12 hours for chronic pain. 06/07/24   Breeback, Jade L, PA-C  hydroxychloroquine (PLAQUENIL) 200 MG tablet Take 200 mg by mouth 2 (two) times daily. 200mg  twice daily on weekdays and 200mg  once daily on weekends as of 12/03/22 review    [provider]  levocetirizine (XYZAL ) 5 MG tablet TAKE 1 TABLET BY MOUTH EVERY EVENING 04/01/24   Breeback, Jade L, PA-C  metFORMIN  (GLUCOPHAGE ) 1000 MG tablet Take 1 tablet (1,000 mg total) by mouth 2 (two) times daily with a meal. 06/07/24    Breeback, Jade L, PA-C  montelukast  (SINGULAIR ) 10 MG tablet Take 1 tablet (10 mg total) by mouth at bedtime. 07/13/23   Breeback, Jade L, PA-C  pregabalin  (LYRICA ) 200 MG capsule TAKE 1 CAPSULE BY MOUTH 2-3 TIMES DAILY 02/25/24   Curtis Debby PARAS, MD  Semaglutide , 1 MG/DOSE, (OZEMPIC , 1 MG/DOSE,) 4 MG/3ML SOPN Inject 1 mg into the skin once a week. 06/08/24   Breeback, Jade L, PA-C  solifenacin  (VESICARE ) 10 MG tablet Take 1 tablet (10 mg total) by mouth daily. 08/13/23   Stoneking, Adine PARAS., MD  tamsulosin  (FLOMAX ) 0.4 MG CAPS capsule Take 1 capsule (0.4 mg total) by mouth daily. 06/07/24   Breeback, Jade L, PA-C  tiZANidine  (ZANAFLEX ) 4 MG tablet Take 1 tablet (4 mg  total) by mouth every 6 (six) hours as needed for muscle spasms. 06/21/24   Alvan Dorothyann BIRCH, MD    Family History Family History  Problem Relation Age of Onset   Diabetes Mother    Stroke Mother    Lupus Mother    Hypertension Mother    Congestive Heart Failure Mother    Mental illness Brother        Not clear what his diagnosis is--may be related to previous drug use.   Cancer Maternal Grandmother        colon   Diabetes Maternal Grandfather    Depression Maternal Uncle    Alcohol abuse Maternal Uncle     Social History Social History   Tobacco Use   Smoking status: Never   Smokeless tobacco: Never  Vaping Use   Vaping status: Never Used  Substance Use Topics   Alcohol use: No    Alcohol/week: 0.0 standard drinks of alcohol   Drug use: No     Allergies   Metaxalone   Review of Systems Review of Systems See HPI  Physical Exam Triage Vital Signs ED Triage Vitals  Encounter Vitals Group     BP 07/09/24 0910 102/68     Girls Systolic BP Percentile --      Girls Diastolic BP Percentile --      Boys Systolic BP Percentile --      Boys Diastolic BP Percentile --      Pulse Rate 07/09/24 0910 92     Resp 07/09/24 0910 16     Temp 07/09/24 0910 97.8 F (36.6 C)     Temp src --      SpO2  07/09/24 0910 96 %     Weight --      Height --      Head Circumference --      Peak Flow --      Pain Score 07/09/24 0912 2     Pain Loc --      Pain Education --      Exclude from Growth Chart --    No data found.  Updated Vital Signs BP 102/68   Pulse 92   Temp 97.8 F (36.6 C)   Resp 16   SpO2 96%      Physical Exam Constitutional:      General: She is not in acute distress.    Appearance: She is well-developed and normal weight.  HENT:     Head: Normocephalic and atraumatic.  Eyes:     Conjunctiva/sclera: Conjunctivae normal.     Pupils: Pupils are equal, round, and reactive to light.  Cardiovascular:     Rate and Rhythm: Normal rate.  Pulmonary:     Effort: Pulmonary effort is normal. No respiratory distress.  Musculoskeletal:        General: Normal range of motion.     Cervical back: Normal range of motion.  Skin:    General: Skin is warm and dry.     Comments: Great toe on the left foot has an irregularly trimmed nail that is quite short at 1 edge.  There is erythema of the surrounding skin.  There is a small granuloma present at the nail edge.  This is treated with silver nitrate.  Neurological:     Mental Status: She is alert.      UC Treatments / Results  Labs (all labs ordered are listed, but only abnormal results are displayed) Labs Reviewed - No data to display  EKG   Radiology No results found.  Procedures Procedures (including critical care time)  Medications Ordered in UC Medications - No data to display  Initial Impression / Assessment and Plan / UC Course  I have reviewed the triage vital signs and the nursing notes.  Pertinent labs & imaging results that were available during my care of the patient were reviewed by me and considered in my medical decision making (see chart for details).     Wound care discussed. Final Clinical Impressions(s) / UC Diagnoses   Final diagnoses:  Pyogenic granuloma of skin  Paronychia of  great toe     Discharge Instructions      Take Keflex  2 times a day Continue warm soaks daily See your doctor if not improving by next week May need referral to podiatry for toenail surgery   ED Prescriptions     Medication Sig Dispense Auth. Provider   cephALEXin  (KEFLEX ) 500 MG capsule Take 1 capsule (500 mg total) by mouth 2 (two) times daily. 10 capsule Maranda Jamee Jacob, MD      PDMP not reviewed this encounter.   Maranda Jamee Jacob, MD 07/09/24 1014

## 2024-07-09 NOTE — ED Triage Notes (Signed)
 Right big toe ingrown toenail x 1 week. Has used antibiotic ointment and soaking in epsom salt.

## 2024-07-15 ENCOUNTER — Ambulatory Visit: Payer: MEDICAID | Admitting: Podiatry

## 2024-07-15 DIAGNOSIS — L600 Ingrowing nail: Secondary | ICD-10-CM

## 2024-07-15 DIAGNOSIS — L02611 Cutaneous abscess of right foot: Secondary | ICD-10-CM | POA: Diagnosis not present

## 2024-07-15 DIAGNOSIS — L03031 Cellulitis of right toe: Secondary | ICD-10-CM | POA: Diagnosis not present

## 2024-07-15 MED ORDER — CEPHALEXIN 500 MG PO CAPS: 500.0000 mg | ORAL_CAPSULE | Freq: Two times a day (BID) | ORAL | 0 refills | Status: DC

## 2024-07-15 MED ORDER — CEPHALEXIN 500 MG PO CAPS: 500.0000 mg | ORAL_CAPSULE | Freq: Two times a day (BID) | ORAL | 0 refills | Status: AC

## 2024-07-15 NOTE — Addendum Note (Signed)
 Addended by: GIB MABLE SAUNDERS on: 07/15/2024 09:59 AM   Modules accepted: Orders

## 2024-07-15 NOTE — Patient Instructions (Signed)

## 2024-07-15 NOTE — Progress Notes (Signed)
 Patient complains of painful ingrown both border(s) toe 1 right.  Just finished a round of antibiotics.  Patient denies fevers, chills, nausea, vomiting.  Objective:  Vitals: Reviewed  General: Well developed, nourished, in no acute distress, alert and oriented x3   Vascular: DP pulse 2/4 bilateral. PT pulse 2/4 bilateral. No edema.  Dermatology: Erythema, edema, incurvated nail border both borders hallux right clear with clear drainage . Tenderness present with palpation. Normal skin tone and texture feet with normal hair growth.  Neurological: Grossly intact. Normal reflexes.   Musculoskeletal: Tenderness with palpation of the distal clear . No tenderness or painful ROM at IPJ.  Diagnosis: Ingrown nail both borders hallux right  Plan: -discussed etiology and treatment of ingrown nails. Discussed surgical vs conservative treatment. -Consent signed for appropriate matrixectomy affected nail(s). -Rx: Keflex  500 mg 2 p.o. twice daily for 7 days  Procedure(s):   - Matrixectomy(s) borders hallux right: Toe anesthetized with 3cc 2:1 mixture 2% Lidocaine  with epinephrine : Sodium Bicarbonate. Surgical site prepped. Digital tourniquet applied.  Avulsion of borders. performed. Matrixecomy performed with three 30 second applications of phenol to nail matrix. Site irrigated with alcohol.  Tourniquet released with good vascularity noticed in digit.  Applied triple antibiotic to nailbed and applied gauze and Coban dressing. - Written and oral postoperative instructions given.  -Return for post-op 2 weeks.  JINNY Prentice Binder, DPM

## 2024-07-21 ENCOUNTER — Telehealth: Payer: Self-pay | Admitting: Physician Assistant

## 2024-07-21 DIAGNOSIS — M545 Low back pain, unspecified: Secondary | ICD-10-CM

## 2024-07-21 DIAGNOSIS — G894 Chronic pain syndrome: Secondary | ICD-10-CM

## 2024-07-21 DIAGNOSIS — M0609 Rheumatoid arthritis without rheumatoid factor, multiple sites: Secondary | ICD-10-CM

## 2024-07-21 DIAGNOSIS — M47816 Spondylosis without myelopathy or radiculopathy, lumbar region: Secondary | ICD-10-CM

## 2024-07-21 DIAGNOSIS — G8929 Other chronic pain: Secondary | ICD-10-CM

## 2024-07-21 DIAGNOSIS — M51362 Other intervertebral disc degeneration, lumbar region with discogenic back pain and lower extremity pain: Secondary | ICD-10-CM

## 2024-07-21 NOTE — Telephone Encounter (Signed)
 Copied from CRM [id number]. Topic: Clinical - Medication Refill >> Jul 21, 2024  1:37 PM Carrielelia G wrote: Medication: HYDROcodone -acetaminophen  (NORCO/VICODIN) 5-325 MG tablet  Has the patient contacted their pharmacy? Yes (Agent: If no, request that the patient contact the pharmacy for the refill. If patient does not wish to contact the pharmacy document the reason why and proceed with request.) (Agent: If yes, when and what did the pharmacy advise?)  This is the patient's preferred pharmacy:  [facility], [place] - [address] [place] [address] Phone: [phone] Fax: [phone]   Is this the correct pharmacy for this prescription? Yes If no, delete pharmacy and type the correct one.    Is the patient out of the medication? No  Has the patient been seen for an appointment in the last year OR does the patient have an upcoming appointment? Yes  Can we respond through MyChart? Yes  Agent: Please be advised that Rx refills may take up to 3 business days. We ask that you follow-up with your pharmacy.

## 2024-07-25 ENCOUNTER — Ambulatory Visit (INDEPENDENT_AMBULATORY_CARE_PROVIDER_SITE_OTHER): Payer: MEDICAID | Admitting: Sports Medicine

## 2024-07-25 DIAGNOSIS — M47816 Spondylosis without myelopathy or radiculopathy, lumbar region: Secondary | ICD-10-CM | POA: Diagnosis not present

## 2024-07-25 MED ORDER — HYDROCODONE-ACETAMINOPHEN 5-325 MG PO TABS: ORAL_TABLET | ORAL | 0 refills | Status: DC

## 2024-07-25 NOTE — Assessment & Plan Note (Signed)
 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   She initially did well after a right L4-L5 interlaminar epidural. She has seen Dr. Beuford, it sounds like surgery was mentioned but they had more suggested nonoperative intervention as she was responding well to the epidural. I will order her next epidural, she understands she can have 3 in 6 months.

## 2024-07-25 NOTE — Progress Notes (Signed)
    Procedures performed today:    None.  Independent interpretation of notes and tests performed by another provider:   None.  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   She initially did well after a right L4-L5 interlaminar epidural. She has seen Dr. Beuford, it sounds like surgery was mentioned but they had more suggested nonoperative intervention as she was responding well to the epidural. I will order her next epidural, she understands she can have 3 in 6 months.    ____________________________________________ Debby PARAS. Curtis, M.D., ABFM., CAQSM., AME. Primary Care and Sports Medicine [facility]  Adjunct Professor of [facility]  [employer]  Restaurant manager, fast food

## 2024-07-25 NOTE — Telephone Encounter (Signed)
..  PDMP reviewed during this encounter. No concerns, last fill 6/26 UTD appt and pain contract Sent refill

## 2024-07-26 ENCOUNTER — Other Ambulatory Visit: Payer: Self-pay | Admitting: Physician Assistant

## 2024-07-26 ENCOUNTER — Other Ambulatory Visit: Payer: Self-pay | Admitting: Sports Medicine

## 2024-07-26 DIAGNOSIS — R0989 Other specified symptoms and signs involving the circulatory and respiratory systems: Secondary | ICD-10-CM

## 2024-07-26 DIAGNOSIS — J302 Other seasonal allergic rhinitis: Secondary | ICD-10-CM

## 2024-07-26 DIAGNOSIS — M47816 Spondylosis without myelopathy or radiculopathy, lumbar region: Secondary | ICD-10-CM

## 2024-07-26 DIAGNOSIS — J3489 Other specified disorders of nose and nasal sinuses: Secondary | ICD-10-CM

## 2024-07-26 MED ORDER — PREGABALIN 200 MG PO CAPS: ORAL_CAPSULE | ORAL | 2 refills | Status: DC

## 2024-07-27 ENCOUNTER — Encounter: Payer: Self-pay | Admitting: Sports Medicine

## 2024-07-29 ENCOUNTER — Ambulatory Visit: Payer: MEDICAID | Admitting: Podiatry

## 2024-07-29 DIAGNOSIS — L600 Ingrowing nail: Secondary | ICD-10-CM

## 2024-07-29 NOTE — Progress Notes (Signed)
 Patient presents follow-up nail surgery left great toe.  No complaints.  Physical exam:  Dermatologic: Nail surgery site for matrixectomy healing well with no signs of infection.  Diagnosis: 1.  Status post matrixectomy toe.  Healing well  Plan: - POV status post nail surgery hallux right if patient has any problems she can call for appointment otherwise we can see her as needed - Return as needed

## 2024-08-02 ENCOUNTER — Other Ambulatory Visit: Payer: Self-pay | Admitting: Family Medicine

## 2024-08-02 ENCOUNTER — Encounter: Payer: Self-pay | Admitting: Physician Assistant

## 2024-08-03 NOTE — Discharge Instructions (Signed)

## 2024-08-04 ENCOUNTER — Ambulatory Visit
Admission: RE | Admit: 2024-08-04 | Discharge: 2024-08-04 | Disposition: A | Discharge status: Home / Self Care | Payer: MEDICAID | Source: Ambulatory Visit | Attending: Sports Medicine | Admitting: Sports Medicine

## 2024-08-04 DIAGNOSIS — M47816 Spondylosis without myelopathy or radiculopathy, lumbar region: Secondary | ICD-10-CM

## 2024-08-04 MED ORDER — METHYLPREDNISOLONE ACETATE 40 MG/ML INJ SUSP (RADIOLOG: 80.0000 mg | Freq: Once | INTRAMUSCULAR | Status: DC

## 2024-08-04 MED ORDER — IOPAMIDOL (ISOVUE-M 200) INJECTION 41%: 1.0000 mL | Freq: Once | INTRAMUSCULAR | Status: DC

## 2024-08-10 ENCOUNTER — Ambulatory Visit (HOSPITAL_COMMUNITY): Payer: MEDICAID | Admitting: Mental Health

## 2024-08-11 ENCOUNTER — Telehealth (HOSPITAL_COMMUNITY): Payer: MEDICAID | Admitting: Family

## 2024-08-11 ENCOUNTER — Encounter (HOSPITAL_COMMUNITY): Payer: Self-pay | Admitting: Family

## 2024-08-11 DIAGNOSIS — F900 Attention-deficit hyperactivity disorder, predominantly inattentive type: Secondary | ICD-10-CM | POA: Diagnosis not present

## 2024-08-11 DIAGNOSIS — F319 Bipolar disorder, unspecified: Secondary | ICD-10-CM | POA: Diagnosis not present

## 2024-08-11 MED ORDER — BUPROPION HCL ER (XL) 300 MG PO TB24: 300.0000 mg | ORAL_TABLET | Freq: Every day | ORAL | 3 refills | Status: DC

## 2024-08-11 MED ORDER — ARIPIPRAZOLE 15 MG PO TABS: 15.0000 mg | ORAL_TABLET | Freq: Every day | ORAL | 3 refills | Status: DC

## 2024-08-11 NOTE — Progress Notes (Signed)
 BH MD/PA/NP OP Progress Note Virtual Visit via Video Note  I connected with Frances [name] on 08/11/24 at  9:00 AM EDT by a video enabled telemedicine application and verified that I am speaking with the correct person using two identifiers.  Location: Patient: Home Provider: Clinic   I discussed the limitations of evaluation and management by telemedicine and the availability of in person appointments. The patient expressed understanding and agreed to proceed.  I provided 30 minutes of non-face-to-face time during this encounter.    08/11/2024 4:32 PM Frances [name]  MRN:  [id number]  Chief Complaint: I am concerned about everything  HPI: 48 year old female seen today for follow up psychiatric evaluation. She has a psychiatric history of bipolar 1, borderline personality disorder, depression, and anxiety. She is currently managed on Abilify  15 mg nightly and Wellbutrin  300 we will mg daily and Cymbalta  90 mg daily (from rheumatologist). Today, patient notes that medications are effective in managing her symptoms.   Today, the patient was well-groomed, pleasant, cooperative, and maintained good eye contact. She was engaged appropriately in conversation.  She reports that her pain is controlled and she has no new concerns. Currently, she is house-sitting for friends and caring for their animals. She shared that horseback riding is an important therapeutic outlet for her. She is taking her prescribed medications and describes them as effective overall.  The patient noted that ongoing legal issues continue to exacerbate her mental health symptoms. She reports increased anxiety and depressive symptoms compared to her last visit. A GAD-7 was completed today with a score of 8 (previously 4), and a PHQ-9 was completed with a score of 11 (previously 9). She endorsed adequate sleep and an increased appetite, reporting a 10-pound weight gain since her last visit. She denied suicidal  ideation, homicidal ideation, auditory or visual hallucinations, mania, or paranoia.  The patient requested medication to help better manage her anxiety. Hydroxyzine  was discussed, though she noted it has not been effective in the past. She will continue her current medications as prescribed. Risks versus benefits of treatment and non-treatment, as well as potential side effects, were explained and discussed in detail. The patient verbalized understanding, and all questions were answered.  No medication concerns were identified at this time.   Visit Diagnosis:    ICD-10-CM   1. Bipolar I disorder (HCC)  F31.9     2. Attention deficit hyperactivity disorder (ADHD), predominantly inattentive type  F90.0       Past Psychiatric History: Bipolar, anxiety, and depression Past Medical History:  Past Medical History:  Diagnosis Date   Anxiety    Arthritis    Back pain, chronic    Bipolar 1 disorder (HCC)    Chronic fatigue syndrome    DDD (degenerative disc disease), lumbar    Depression    Diabetes mellitus without complication (HCC)    Fibromyalgia    IUD 2012   Mirena   MTHFR gene mutation    Rheumatoid arteritis (HCC)     Past Surgical History:  Procedure Laterality Date   BREAST REDUCTION SURGERY Bilateral 01/07/2024   Procedure: Bilateral breast reduction with liposuction;  Surgeon: Lowery Estefana RAMAN, DO;  Location: [facility];  Service: Plastics;  Laterality: Bilateral;   COLONOSCOPY WITH PROPOFOL  N/A 02/25/2017   Procedure: COLONOSCOPY WITH PROPOFOL ;  Surgeon: Elsie Cree, MD;  Location: [facility];  Service: Endoscopy;  Laterality: N/A;   left knee lateral release  1993   scar tisue removal  03/2005   left ankle   TONSILLECTOMY  age 61's    Family Psychiatric History: Maternal aunts Bipolar disorder and uncle alcoholic  Family History:  Family History  Problem Relation Age of Onset   Diabetes Mother    Stroke Mother    Lupus Mother     Hypertension Mother    Congestive Heart Failure Mother    Mental illness Brother        Not clear what his diagnosis is--may be related to previous drug use.   Cancer Maternal Grandmother        colon   Diabetes Maternal Grandfather    Depression Maternal Uncle    Alcohol abuse Maternal Uncle     Social History:  Social History   Socioeconomic History   Marital status: Single    Spouse name: Not on file   Number of children: 0   Years of education: Not on file   Highest education level: Bachelor's degree (e.g., BA, AB, BS)  Occupational History   Occupation: Airline pilot at times  Tobacco Use   Smoking status: Never   Smokeless tobacco: Never  Vaping Use   Vaping status: Never Used  Substance and Sexual Activity   Alcohol use: No    Alcohol/week: 0.0 standard drinks of alcohol   Drug use: No   Sexual activity: Not on file  Other Topics Concern   Not on file  Social History Narrative   Originally from [place] , outside of [place].    Moved here permanently 2012.   Intermittently worked on a farm.   Lives with many cats--3 plus fosters cats.    Single, lives alone in a one story home. Rarely drinks caffeine . 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   Social Drivers of Health   Financial Resource Strain: Medium Risk (06/12/2023)   Overall Financial Resource Strain (CARDIA)    Difficulty of Paying Living Expenses: Somewhat hard  Food Insecurity: Food Insecurity Present (06/12/2023)   Hunger Vital Sign    Worried About Running Out of Food in the Last Year: Sometimes true    Ran Out of Food in the Last Year: Never true  Transportation Needs: No Transportation Needs (06/12/2023)   PRAPARE - Administrator, Civil Service (Medical): No    Lack of Transportation (Non-Medical): No  Physical Activity: Insufficiently Active (07/27/2023)   Exercise Vital Sign    Days of Exercise per Week: 3 days    Minutes of Exercise per Session: 30 min   Stress: No Stress Concern Present (06/12/2023)   [employer] of Occupational Health - Occupational Stress Questionnaire    Feeling of Stress : Only a little  Social Connections: Socially Isolated (06/12/2023)   Social Connection and Isolation Panel    Frequency of Communication with Friends and Family: Never    Frequency of Social Gatherings with Friends and Family: Twice a week    Attends Religious Services: Never    Database administrator or Organizations: Yes    Attends Engineer, structural: More than 4 times per year    Marital Status: Never married    Allergies:  Allergies  Allergen Reactions   Metaxalone Hives    Metabolic Disorder Labs: Lab Results  Component Value Date   HGBA1C 5.5 06/08/2024   MPG 103 04/14/2018   Lab Results  Component Value Date   PROLACTIN 16.4 09/02/2023   Lab Results  Component Value Date   CHOL 132 09/02/2023   TRIG  201 (H) 09/02/2023   HDL 39 (L) 09/02/2023   CHOLHDL 3.4 09/02/2023   LDLCALC 60 09/02/2023   LDLCALC 41 09/10/2022   Lab Results  Component Value Date   TSH 1.030 09/02/2023   TSH 0.866 11/21/2020    Therapeutic Level Labs: No results found for: LITHIUM No results found for: VALPROATE No results found for: CBMZ  Current Medications: Current Outpatient Medications  Medication Sig Dispense Refill   DULoxetine  HCl 30 MG CSDR Take 90 mg by mouth daily.     albuterol  (VENTOLIN  HFA) 108 (90 Base) MCG/ACT inhaler Take 2 puffs 15 minutes before exercise and as needed for shortness of breath. 6.7 g 1   ARIPiprazole  (ABILIFY ) 15 MG tablet Take 1 tablet (15 mg total) by mouth daily. 30 tablet 3   atorvastatin  (LIPITOR) 20 MG tablet TAKE 1 TABLET BY MOUTH DAILY 90 tablet 0   Azelastine  HCl 137 MCG/SPRAY SOLN USE 2 SPRAY IN ALTERNATE NOSTRILS TWICE A DAY 30 mL 0   blood glucose meter kit and supplies KIT Dispense based on patient and insurance preference. Use up to four times daily as directed. 1 each 0    buPROPion  (WELLBUTRIN  XL) 300 MG 24 hr tablet Take 1 tablet (300 mg total) by mouth daily. 30 tablet 3   cholecalciferol (VITAMIN D3) 25 MCG (1000 UT) tablet Take 1,000 Units by mouth daily.     diclofenac  (VOLTAREN ) 75 MG EC tablet Take 1 tablet (75 mg total) by mouth 2 (two) times daily. 60 tablet 0   diclofenac  Sodium (VOLTAREN ) 1 % GEL Apply 2 g topically 4 (four) times daily. To affected joint. 100 g 11   fluticasone  (FLONASE ) 50 MCG/ACT nasal spray 2 SPRAY IN EACH NOSTRIL DAILY 16 g 0   HYDROcodone -acetaminophen  (NORCO/VICODIN) 5-325 MG tablet Take one to one-half tablet every 12 hours for chronic pain. 90 tablet 0   hydroxychloroquine (PLAQUENIL) 200 MG tablet Take 200 mg by mouth 2 (two) times daily. 200mg  twice daily on weekdays and 200mg  once daily on weekends as of 12/03/22 review     levocetirizine (XYZAL ) 5 MG tablet TAKE 1 TABLET BY MOUTH IN THE EVENING 34 tablet 1   metFORMIN  (GLUCOPHAGE ) 1000 MG tablet Take 1 tablet (1,000 mg total) by mouth 2 (two) times daily with a meal. 180 tablet 0   montelukast  (SINGULAIR ) 10 MG tablet Take 1 tablet (10 mg total) by mouth at bedtime. 90 tablet 3   pregabalin  (LYRICA ) 200 MG capsule TAKE 1 CAPSULE BY MOUTH 2-3 TIMES DAILY 90 capsule 2   Semaglutide , 1 MG/DOSE, (OZEMPIC , 1 MG/DOSE,) 4 MG/3ML SOPN Inject 1 mg into the skin once a week. 9 mL 0   solifenacin  (VESICARE ) 10 MG tablet Take 1 tablet (10 mg total) by mouth daily. 30 tablet 11   tamsulosin  (FLOMAX ) 0.4 MG CAPS capsule Take 1 capsule (0.4 mg total) by mouth daily. 90 capsule 1   tiZANidine  (ZANAFLEX ) 4 MG tablet TAKE 1 TABLET BY MOUTH EVERY 6 HOURS AS NEEDED FOR MUSCLE SPASMS 90 tablet 0   No current facility-administered medications for this visit.     Musculoskeletal: Strength & Muscle Tone: within normal limits and  telehealth visit Gait & Station: normal, telehealth visit Patient leans: N/A  Psychiatric Specialty Exam: Review of Systems  There were no vitals taken for this  visit.There is no height or weight on file to calculate BMI.  General Appearance: Well Groomed  Eye Contact:  Good  Speech:  Clear and Coherent and Normal Rate  Volume:  Normal  Mood:  Anxious, Depressed, and mild  Affect:  Congruent  Thought Process:  Coherent, Goal Directed and Linear  Orientation:  Full (Time, Place, and Person)  Thought Content: WDL and Logical   Suicidal Thoughts:  No  Homicidal Thoughts:  No  Memory:  Immediate;   Good Recent;   Good Remote;   Good  Judgement:  Good  Insight:  Good  Psychomotor Activity:  Normal  Concentration:  Concentration: Good and Attention Span: Good  Recall:  Good  Fund of Knowledge: Good  Language: Good  Akathisia:  No  Handed:  Right  AIMS (if indicated): not done  Assets:  Communication Skills Desire for Improvement Financial Resources/Insurance Housing Social Support  ADL's:  Intact  Cognition: WNL  Sleep:  Good   Screenings: AIMS    Flowsheet Row Clinical Support from 06/30/2023 in [facility]  AIMS Total Score 5 0   AUDIT    Flowsheet Row Ad[facility] (Discharged[facility]  Alcohol Use Disorder Identification Test Final Score (AUDIT) 0   GAD-7    Flowsheet Row Video Visit from 06/02/2024 in [facility] Video Visit from 04/13/2024 in [facility] Video Visit from 01/21/2024 in [facility] Office Visit from 09/11/2023 in [facility] at [facility] Video Visit from 09/10/2023 in [facility]  Total GAD-7 Score 8 4 1 2 3    PHQ2-9    Flowsheet Row Office Visit from 06/07/2024 in [facility] Primary Care & Sports Medicine at [facility] Video Visit from 06/02/2024 in [facility] Video Visit from 04/13/2024 in [facility] Video Visit from 01/21/2024 in [facility] Office Visit from 09/11/2023 in [facility] Primary Care & Sports Medicine at [facility]  PHQ-2 Total Score 0 3 2 2 2   PHQ-9 Total Score -- 11 9 8 9    Flowsheet Row UC from 07/09/2024 in [facility] at [facility] Video Visit from 06/02/2024 in [facility] UC from 05/02/2024 in [facility] at [facility] RISK CATEGORY No Risk Error: Q7 should not be populated when Q6 is No No Risk     Assessment and Plan:  Patient requested medication to help manage her anxiety.  Provider offered patient hydroxyzine  however she has not been effective in the past.  She will continue other medications as prescribed.   1. Bipolar I disorder (HCC)  Continue- buPROPion  (WELLBUTRIN  XL) 300 MG 24 hr tablet; Take 1 tablet (300 mg total) by mouth daily.  Dispense: 30 tablet; Refill: 3   2. Attention deficit hyperactivity disorder (ADHD), predominantly inattentive type  Continue- buPROPion  (WELLBUTRIN  XL) 300 MG 24 hr tablet; Take 1 tablet (300 mg total) by mouth daily.  Dispense: 30 tablet; Refill: 3  3. Generalized anxiety disorder (Primary)  Start- busPIRone  (BUSPAR ) 10 MG tablet; Take 1 tablet (10 mg total) by mouth 3 (three) times daily.  Dispense: 90 tablet; Refill: 3   Follow up in 3 months Follow up with therapy   Majel GORMAN Ramp, FNP 08/11/2024, 4:32 PM

## 2024-08-12 ENCOUNTER — Ambulatory Visit: Payer: MEDICAID | Admitting: Clinical

## 2024-08-12 ENCOUNTER — Other Ambulatory Visit: Payer: Self-pay | Admitting: Physician Assistant

## 2024-08-18 ENCOUNTER — Ambulatory Visit (INDEPENDENT_AMBULATORY_CARE_PROVIDER_SITE_OTHER): Payer: MEDICAID | Admitting: Professional

## 2024-08-18 ENCOUNTER — Encounter: Payer: Self-pay | Admitting: Professional

## 2024-08-18 DIAGNOSIS — F900 Attention-deficit hyperactivity disorder, predominantly inattentive type: Secondary | ICD-10-CM

## 2024-08-18 DIAGNOSIS — F411 Generalized anxiety disorder: Secondary | ICD-10-CM

## 2024-08-18 DIAGNOSIS — F319 Bipolar disorder, unspecified: Secondary | ICD-10-CM | POA: Diagnosis not present

## 2024-08-18 DIAGNOSIS — F603 Borderline personality disorder: Secondary | ICD-10-CM

## 2024-08-18 DIAGNOSIS — G2401 Drug induced subacute dyskinesia: Secondary | ICD-10-CM

## 2024-08-18 NOTE — Progress Notes (Signed)
   Nathanel Collet, [facility]
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
## 2024-08-23 ENCOUNTER — Ambulatory Visit: Payer: MEDICAID

## 2024-08-23 ENCOUNTER — Encounter: Payer: Self-pay | Admitting: Sports Medicine

## 2024-08-23 ENCOUNTER — Other Ambulatory Visit: Payer: Self-pay

## 2024-08-23 VITALS — BP 102/78 | Ht 67.0 in | Wt 173.0 lb

## 2024-08-23 DIAGNOSIS — G8929 Other chronic pain: Secondary | ICD-10-CM

## 2024-08-23 DIAGNOSIS — M7541 Impingement syndrome of right shoulder: Secondary | ICD-10-CM

## 2024-08-23 DIAGNOSIS — M25511 Pain in right shoulder: Secondary | ICD-10-CM | POA: Diagnosis not present

## 2024-08-23 DIAGNOSIS — S46011A Strain of muscle(s) and tendon(s) of the rotator cuff of right shoulder, initial encounter: Secondary | ICD-10-CM | POA: Diagnosis not present

## 2024-08-23 MED ORDER — METHYLPREDNISOLONE ACETATE 40 MG/ML IJ SUSP
40.0000 mg | Freq: Once | INTRAMUSCULAR | Status: AC
  Administered 2024-08-23: 40 mg via INTRA_ARTICULAR

## 2024-08-23 NOTE — Progress Notes (Signed)
   Subjective:    Patient ID: Avelina DELENA Salle, female    DOB: 48 y.o., [birth year]   MRN: [id number]  HPI  Chief Complaint: Right shoulder pain  PMHx of fibromyalgia, degenerative disc disease, horseshoe kidney, bipolar and/or borderline personality disorder, and homzygous MTHFR mutation   Also has history of hand arthritis, likely due to osteoarthritis +/- mild seronegative inflammatory arthritis based on ultrasound in August 2021 showing R 3rd MCP osteophytosis and erosion, and prior ultrasound in 2018 w/ MCP effusions.   03/21/2023 right shoulder MRI: Moderate distal supraspinatus and mild distal infraspinatus tendinosis with bursal sided fraying. No high-grade or retracted cuff tear. No significant muscle atrophy. Mild subacromial-subdeltoid bursitis. Moderate intra-articular long head biceps tendinosis. Mild glenohumeral and AC joint osteoarthritis. Received injection on 03/23/2024 in the right glenohumeral joint which provided minimal relief. Received injection on 03/31/2023 in the right subacromial bursa which provided a strong amount of relief for her. Right shoulder pain has worsened recently Has had suboptimal experiences with physical therapy in the past Not taking any medication specifically for this (though believes her Vicodin likely helps some) No numbness/tingling going down her arm No new fall/significant injury No prior history of shoulder surgery. No prior dislocation of right shoulder.  Objective:   Physical Exam Vitals:   08/23/24 1011  BP: 102/78   Right shoulder ( compared to normal ) Inspection: - swelling, - scapular dyskinesis Palpation: TTP + greater tuberosity, + AC joint, + biceps tendon, - posterior shoulder AROM/PROM: 180 forward flexion, 180 abduction, 60 external rotation, internal rotation to lumbar spine Strength: 5/5 lift off, 5/5 empty can, 5/5 external rotation, 5/5 flexion, - drop arm test Special tests:    -Rotator Cuff: + Neer's, +  Hawkin's, - empty can (though this reproduces her pain), - painful arc   -Labrum: + O'brien's, equivocal jerk   -Biceps: + speed's, - yergason's    -AC Joint: + cross arm testing     -Instability: - external rotation/apprehension/relocation test, - sulcus sign   Subacromial Space Injection with Ultrasound Guidance Procedure Note LARRA CRUNKLETON 05-Jul-1976 Indications: Pain Procedure Details Verbal consent was obtained from the patient. Risks, benefits, and alternatives were explained. The right subacromial space and subacromial/subdeltoid bursa was identified on US . Patient prepped with Chloraprep and Ethyl Chloride used for anesthesia. The patient was then injected using a lateral approach with a solution of 3cc Mepivacaine, 0.5cc Sodium Bicarbonate 8.4%, and 40mg  Depo-Medrol . The patient tolerated the procedure well and had decreased pain post injection. No complications.   Independent review of her three-view plain film radiographs obtained of her right shoulder in 2022 reveal well-preserved glenohumeral joint space.  AC joint arthritis.  Type II acromion.    Assessment & Plan:   Marvette is a 48 y.o. right-hand-dominant female with past medical history significant for fibromyalgia, horseshoe kidney, seronegative inflammatory arthritis presenting for follow-up on chronic right shoulder pain.  Her exam today is most consistent with rotator cuff disease with shoulder impingement and some lesser contribution from possible labral involvement as well.  Overall, given her fantastic response to a subacromial injection previously, I will provide this today for pain relief and allow optimal participation in physical therapy going forward.  Recommend follow-up in 6 weeks.  If pain persisting, can consider ultrasound of shoulder at that time versus referral for MRI.

## 2024-09-01 ENCOUNTER — Telehealth (HOSPITAL_COMMUNITY): Payer: MEDICAID | Admitting: Psychiatry

## 2024-09-05 ENCOUNTER — Telehealth: Payer: Self-pay | Admitting: *Deleted

## 2024-09-05 DIAGNOSIS — M7541 Impingement syndrome of right shoulder: Secondary | ICD-10-CM

## 2024-09-05 DIAGNOSIS — S46011A Strain of muscle(s) and tendon(s) of the rotator cuff of right shoulder, initial encounter: Secondary | ICD-10-CM

## 2024-09-05 DIAGNOSIS — G8929 Other chronic pain: Secondary | ICD-10-CM

## 2024-09-05 NOTE — Telephone Encounter (Signed)
-----   Message from Alisa DEL sent at 09/05/2024 11:21 AM EDT ----- Regarding: Pt cld says still waiting on PT to call Pt says Dr.Gottwalt referred her to PT, I do not see a referral order on the 9/5 OV notes

## 2024-09-05 NOTE — Telephone Encounter (Signed)
 PT referral placed to [facility] in [place].

## 2024-09-06 NOTE — Telephone Encounter (Unsigned)
 Copied from CRM [id number]. Topic: Clinical - Medication Refill >> Sep 06, 2024  1:29 PM Carrielelia G wrote: Medication: HYDROcodone -acetaminophen  (NORCO/VICODIN) 5-325 MG tablet  Has the patient contacted their pharmacy? No (Agent: If no, request that the patient contact the pharmacy for the refill. If patient does not wish to contact the pharmacy document the reason why and proceed with request.) (Agent: If yes, when and what did the pharmacy advise?)  This is the patient's preferred pharmacy:  [facility], [place] - [address] [place] [address] Phone: [phone] Fax: [phone]  Is this the correct pharmacy for this prescription? Yes If no, delete pharmacy and type the correct one.    Is the patient out of the medication? Yes  Has the patient been seen for an appointment in the last year OR does the patient have an upcoming appointment? Yes  Can we respond through MyChart? Yes  Agent: Please be advised that Rx refills may take up to 3 business days. We ask that you follow-up with your pharmacy.

## 2024-09-07 ENCOUNTER — Encounter: Payer: Self-pay | Admitting: Physician Assistant

## 2024-09-07 ENCOUNTER — Encounter: Payer: Self-pay | Admitting: Rehabilitative and Restorative Service Providers"

## 2024-09-07 ENCOUNTER — Ambulatory Visit: Payer: MEDICAID | Admitting: Rehabilitative and Restorative Service Providers"

## 2024-09-07 ENCOUNTER — Ambulatory Visit (INDEPENDENT_AMBULATORY_CARE_PROVIDER_SITE_OTHER): Payer: MEDICAID | Admitting: Physician Assistant

## 2024-09-07 ENCOUNTER — Other Ambulatory Visit: Payer: Self-pay

## 2024-09-07 VITALS — BP 109/75 | HR 90 | Ht 67.0 in | Wt 174.0 lb

## 2024-09-07 DIAGNOSIS — M25551 Pain in right hip: Secondary | ICD-10-CM

## 2024-09-07 DIAGNOSIS — R293 Abnormal posture: Secondary | ICD-10-CM | POA: Insufficient documentation

## 2024-09-07 DIAGNOSIS — Z23 Encounter for immunization: Secondary | ICD-10-CM

## 2024-09-07 DIAGNOSIS — R29898 Other symptoms and signs involving the musculoskeletal system: Secondary | ICD-10-CM | POA: Insufficient documentation

## 2024-09-07 DIAGNOSIS — G8929 Other chronic pain: Secondary | ICD-10-CM | POA: Insufficient documentation

## 2024-09-07 DIAGNOSIS — Z7984 Long term (current) use of oral hypoglycemic drugs: Secondary | ICD-10-CM

## 2024-09-07 DIAGNOSIS — E785 Hyperlipidemia, unspecified: Secondary | ICD-10-CM | POA: Diagnosis not present

## 2024-09-07 DIAGNOSIS — M25511 Pain in right shoulder: Secondary | ICD-10-CM | POA: Insufficient documentation

## 2024-09-07 DIAGNOSIS — R42 Dizziness and giddiness: Secondary | ICD-10-CM

## 2024-09-07 DIAGNOSIS — M6281 Muscle weakness (generalized): Secondary | ICD-10-CM | POA: Diagnosis present

## 2024-09-07 DIAGNOSIS — L7211 Pilar cyst: Secondary | ICD-10-CM

## 2024-09-07 DIAGNOSIS — M545 Low back pain, unspecified: Secondary | ICD-10-CM | POA: Diagnosis not present

## 2024-09-07 DIAGNOSIS — M7541 Impingement syndrome of right shoulder: Secondary | ICD-10-CM | POA: Insufficient documentation

## 2024-09-07 DIAGNOSIS — M51362 Other intervertebral disc degeneration, lumbar region with discogenic back pain and lower extremity pain: Secondary | ICD-10-CM

## 2024-09-07 DIAGNOSIS — M47816 Spondylosis without myelopathy or radiculopathy, lumbar region: Secondary | ICD-10-CM

## 2024-09-07 DIAGNOSIS — G894 Chronic pain syndrome: Secondary | ICD-10-CM

## 2024-09-07 DIAGNOSIS — Z7985 Long-term (current) use of injectable non-insulin antidiabetic drugs: Secondary | ICD-10-CM

## 2024-09-07 DIAGNOSIS — S46011A Strain of muscle(s) and tendon(s) of the rotator cuff of right shoulder, initial encounter: Secondary | ICD-10-CM | POA: Insufficient documentation

## 2024-09-07 DIAGNOSIS — M0609 Rheumatoid arthritis without rheumatoid factor, multiple sites: Secondary | ICD-10-CM

## 2024-09-07 DIAGNOSIS — E1169 Type 2 diabetes mellitus with other specified complication: Secondary | ICD-10-CM

## 2024-09-07 LAB — POCT GLYCOSYLATED HEMOGLOBIN (HGB A1C): Hemoglobin A1C: 5.6 % (ref 4.0–5.6)

## 2024-09-07 MED ORDER — METFORMIN HCL 1000 MG PO TABS: 1000.0000 mg | ORAL_TABLET | Freq: Two times a day (BID) | ORAL | 0 refills | Status: DC

## 2024-09-07 MED ORDER — OZEMPIC (1 MG/DOSE) 4 MG/3ML ~~LOC~~ SOPN: 1.0000 mg | PEN_INJECTOR | SUBCUTANEOUS | 0 refills | Status: DC

## 2024-09-07 MED ORDER — HYDROCODONE-ACETAMINOPHEN 5-325 MG PO TABS: ORAL_TABLET | ORAL | 0 refills | Status: DC

## 2024-09-07 MED ORDER — KETOROLAC TROMETHAMINE 60 MG/2ML IM SOLN
60.0000 mg | Freq: Once | INTRAMUSCULAR | Status: AC
  Administered 2024-09-07: 60 mg via INTRAMUSCULAR

## 2024-09-07 NOTE — Therapy (Addendum)
 OUTPATIENT PHYSICAL THERAPY SHOULDER EVALUATION   Patient Name: Frances [name] MRN: [id number] DOB:[birth year], 48 y.o., female Today's Date: 09/07/2024  END OF SESSION:  PT End of Session - 09/07/24 1143     Visit Number 1    Number of Visits 16    Date for PT Re-Evaluation 11/02/24    Authorization Type [employer]    Authorization Time Period auth required    Authorization - Visit Number 1    PT Start Time 1140    PT Stop Time 1230    PT Time Calculation (min) 50 min    Activity Tolerance Patient tolerated treatment well          Past Medical History:  Diagnosis Date   Anxiety    Arthritis    Back pain, chronic    Bipolar 1 disorder (HCC)    Chronic fatigue syndrome    DDD (degenerative disc disease), lumbar    Depression    Diabetes mellitus without complication (HCC)    Fibromyalgia    IUD 2012   Mirena   MTHFR gene mutation    Rheumatoid arteritis (HCC)    Past Surgical History:  Procedure Laterality Date   BREAST REDUCTION SURGERY Bilateral 01/07/2024   Procedure: Bilateral breast reduction with liposuction;  Surgeon: Lowery Estefana RAMAN, DO;  Location:  SURGERY CENTER;  Service: Plastics;  Laterality: Bilateral;   COLONOSCOPY WITH PROPOFOL  N/A 02/25/2017   Procedure: COLONOSCOPY WITH PROPOFOL ;  Surgeon: Elsie Cree, MD;  Location: [facility];  Service: Endoscopy;  Laterality: N/A;   left knee lateral release  1993   scar tisue removal  03/2005   left ankle   TONSILLECTOMY  age 36's   Patient Active Problem List   Diagnosis Date Noted   Degeneration of intervertebral disc of lumbar region with discogenic back pain and lower extremity pain 06/08/2024   Urinary retention 06/07/2024   Carpal tunnel syndrome, bilateral 02/25/2024   Pain in left third proximal interphalangeal joint 10/16/2023   Personal history UTI 09/14/2023   Rheumatoid arthritis 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 (HCC) 09/14/2023   Urinary hesitancy  06/30/2023   Urinary frequency 06/16/2023   OAB (overactive bladder) 06/16/2023   Chronic pain syndrome 06/10/2023   Crush injury of right foot 05/25/2023   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   Microalbuminuria 09/09/2022   Night sweats 06/18/2022   Proteinuria 03/17/2022   Chronic right hip pain 01/06/2022   Impingement syndrome, shoulder, right 01/06/2022   ETD (Eustachian tube dysfunction), bilateral 08/23/2021   SOB (shortness of breath) on exertion 08/20/2021   Overweight (BMI 25.0-29.9) 06/18/2021   Attention deficit hyperactivity disorder (ADHD), 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   Hyperlipidemia LDL goal <70 03/18/2021   Diabetes mellitus (HCC) 03/13/2021   Seasonal allergies 12/25/2020   Symptomatic mammary hypertrophy 09/04/2020   Recurrent major depressive disorder, in partial remission (HCC) 05/24/2020   Generalized anxiety disorder 05/24/2020   DDD (degenerative disc disease), cervical 03/13/2020   Ingrown right big toenail 03/13/2020   Labral tear of right hip joint, degenerative 01/11/2020   Grief reaction 11/28/2019   Borderline personality disorder (HCC) 01/22/2018   Seronegative rheumatoid arthritis (HCC) 12/09/2017   New daily persistent headache 09/26/2017   MTHFR gene mutation 09/20/2017   Bipolar I disorder (HCC) 09/16/2017   Chronic fatigue syndrome 09/16/2017   Back pain 10/13/2008   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   Lumbar spondylosis 02/10/2008   FIBROMYALGIA 02/10/2008   Other specified abnormal findings of blood chemistry 02/10/2008  PCP: Vermell LITTIE Bologna, PA-C  REFERRING PROVIDER: Dr Redell DELENA Robes  REFERRING DIAG: R RC strain; shoulder impingement; chronic R shoulder pain  THERAPY DIAG:  Chronic right shoulder pain  Muscle weakness (generalized)  Abnormal posture  Other symptoms and signs involving the musculoskeletal system  Rationale for Evaluation and Treatment: Rehabilitation  ONSET DATE: 03/22/24  SUBJECTIVE:                                                                                                                                                                                       SUBJECTIVE STATEMENT: Patient reports that she has had R shoulder pain for 5-10 years with increased symptoms this year. Received injection 4/25 and again 08/23/24 with some improvement but shoulder is still hurting. She does not know of any injury or irritation. Likes to sleep with arms overhead and that irritates shoulder. Has wrist braces that she is supposed to wear when she is sleeping but she has not been using them.     Hand dominance: Right  PERTINENT HISTORY: Anxiety; arthritis; chronic back pain; bipolar disorder; CFS; DDD: depression; DM; fibromyalgia; rheumatoid arteritis; fibromyalgia Chronic fatigue syndrome; Labral tear of Rt hip   PAIN:  Are you having pain? Yes: NPRS scale: 3/10  Pain location: anterior R shoulder  Pain description: dull; aching  Aggravating factors: scooping horse poop; sleeping with arms overhead Relieving factors: injection   PRECAUTIONS: None  RED FLAGS: None   WEIGHT BEARING RESTRICTIONS: No  FALLS:  Has patient fallen in last 6 months? No  LIVING ENVIRONMENT: Lives with: lives with their friend  Lives in: House/apartment Stairs: Yes: Internal: 1 steps; none and External: 3 steps; none Has following equipment at home: None  OCCUPATION: Not working; active with horse riding and care; laundry; household chores irregularly   PLOF: Independent  PATIENT GOALS:decrease shoulder pain and learn some exercises for shoulder   NEXT MD VISIT: 09/15/24  OBJECTIVE:  Note: Objective measures were completed at Evaluation unless otherwise noted.  DIAGNOSTIC FINDINGS:  4/24 MRI R shoulder - Moderate distal supraspinatus and mild distal infraspinatus tendinosis with bursal sided fraying. No high-grade or retracted cuff tear. No significant muscle atrophy.   Mild  subacromial-subdeltoid bursitis.   Moderate intra-articular long head biceps tendinosis.   Mild glenohumeral and AC joint osteoarthritis.  PATIENT SURVEYS:  Quick DASH - 40.9/100; 40.9%   COGNITION: Overall cognitive status: Within functional limits for tasks assessed     SENSATION: Sometimes awakens with tingling thumbs bilat   POSTURE: Patient presents with head forward posture with increased thoracic kyphosis; shoulders rounded and elevated; scapulae abducted and rotated along the thoracic spine; head of the  humerus anterior in orientation.   UPPER EXTREMITY ROM:   Active ROM Right eval Left eval  Shoulder flexion 136 pain 140  Shoulder extension 55 pain pop 61  Shoulder abduction 151 pain 160  Shoulder adduction    Shoulder internal rotation T7 pop T 7  Shoulder external rotation 65 pain 90  Elbow flexion    Elbow extension    Wrist flexion    Wrist extension    Wrist ulnar deviation    Wrist radial deviation    Wrist pronation    Wrist supination    (Blank rows = not tested)  UPPER EXTREMITY MMT:  MMT Right eval Left eval  Shoulder flexion 5 pain  5  Shoulder extension 5 5  Shoulder abduction 5 pain  5  Shoulder adduction    Shoulder internal rotation 5 pain  5  Shoulder external rotation 5 pain  5  Middle trapezius 4 4  Lower trapezius 4 4  Elbow flexion    Elbow extension    Wrist flexion    Wrist extension    Wrist ulnar deviation    Wrist radial deviation    Wrist pronation    Wrist supination    Grip strength (lbs)    (Blank rows = not tested)   PALPATION:  Muscular tightness R > L ant/lat/posterior cervical musculature; pecs; upper trap; leveator; R anterior shoulder/biceps tendon area                                                                                                                              TREATMENT DATE: POC; HEP    PATIENT EDUCATION: Education details: POC; HEP  Person educated: Patient Education method:  Programmer, multimedia, Facilities manager, Actor cues, Verbal cues, and Handouts Education comprehension: verbalized understanding, returned demonstration, verbal cues required, tactile cues required, and needs further education  HOME EXERCISE PROGRAM: Access Code: [email or web address] URL: [email or web address] Date: 09/07/2024 Prepared by: Laurajean Hosek  Exercises - Seated Cervical Retraction  - 2 x daily - 7 x weekly - 1-2 sets - 5-10 reps - 10 sec  hold - Seated Passive Cervical Retraction  - 2 x daily - 7 x weekly - 1 sets - 5-10 reps - 5-10 sec  hold - Supine Cervical Retraction with Towel  - 2 x daily - 7 x weekly - 1 sets - 5-10 reps - 10 sec  hold - Seated Scapular Retraction  - 2 x daily - 7 x weekly - 1-2 sets - 10 reps - 10 sec  hold - Supine Scapular Retraction  - 2 x daily - 7 x weekly - 1 sets - 10 reps - 5-10 sec  hold - Shoulder External Rotation and Scapular Retraction  - 1-2 x daily - 7 x weekly - 1 sets - 10 reps - 3-5 sec   hold - Standing Shoulder W at Wall  - 1-2 x daily - 7 x weekly - 1 sets - 10 reps - 3 sec  hold - Doorway Pec Stretch at 60 Degrees Abduction  - 3 x daily - 7 x weekly - 1 sets - 3 reps - Doorway Pec Stretch at 90 Degrees Abduction  - 3 x daily - 7 x weekly - 1 sets - 3 reps - 30 seconds  hold - Doorway Pec Stretch at 120 Degrees Abduction  - 3 x daily - 7 x weekly - 1 sets - 3 reps - 30 second hold  hold  ASSESSMENT:  CLINICAL IMPRESSION: Patient is a 48 y.o. female who was seen today for physical therapy evaluation and treatment for chronic R shoulder pain; rotator cuff strain; shoulder impingement syndrome. [name] has a history of chronic shoulder pain over the past 5-10 years. She received an injection for R shoulder 4/25 and again 08/23/24. Signs ans symptoms are consistent with impingement R shoulder. Patient presents with poor posture and alignment; postural weakness; limited and painful R shoulder ROM; muscular tightness and pain with palpation in R shoulder  girdle; limited functional activities and pain on a daily basis. Patient will benefit from PT to address problems identified.   OBJECTIVE IMPAIRMENTS: decreased activity tolerance, decreased ROM, decreased strength, impaired UE functional use, improper body mechanics, postural dysfunction, and pain.   ACTIVITY LIMITATIONS: carrying, lifting, and reach over head  PARTICIPATION LIMITATIONS: laundry and caring for horses   PERSONAL FACTORS: Behavior pattern, Fitness, Past/current experiences, Time since onset of injury/illness/exacerbation, and  Comorbidities as noted in history above are also affecting patient's functional outcome.   REHAB POTENTIAL: Good  CLINICAL DECISION MAKING: Evolving/moderate complexity  EVALUATION COMPLEXITY: Moderate   GOALS: Goals reviewed with patient? Yes  SHORT TERM GOALS: Target date: 10/05/2024   Independent in initial HEP  Baseline: Goal status: INITIAL  2.  Increase AROM R shoulder to equal AROM L shoulder  Baseline:  Goal status: INITIAL  3.  Patient demonstrates proper sitting and standing posture to improve scapular and shoulder position  Baseline:  Goal status: INITIAL   LONG TERM GOALS: Target date: 11/02/2024   Decrease pain R shoulder by 50% allowing patient to increase functional activity level  Baseline:  Goal status: INITIAL  2.  5/5 strength posterior shoulder girdle musculature with patient to demonstrate improve postural control to improve GH mechanics and decrease impingement  Baseline:  Goal status: INITIAL  3.  Improve Quick DASH score by 10 points  Baseline: 40.9/100  Goal status: INITIAL  4.  Independent in advanced HEP  Baseline:  Goal status: INITIAL   PLAN:  PT FREQUENCY: 1-2x/week  PT DURATION: 8 weeks  PLANNED INTERVENTIONS: 97164- PT Re-evaluation, 97110-Therapeutic exercises, 97530- Therapeutic activity, 97112- Neuromuscular re-education, 97535- Self Care, 02859- Manual therapy, Patient/Family  education, Taping, and Joint mobilization  PLAN FOR NEXT SESSION: continue with postural correction and education; review and progress exercises; manual work and modalities as indicated    Taziah Difatta P Laquan Beier, PT 09/07/2024, 12:39 PM   For all possible CPT codes, reference the Planned Interventions line above.     Check all conditions that are expected to impact treatment: {Conditions expected to impact treatment:Musculoskeletal disorders, Contractures, spasticity or fracture relevant to requested treatment, and Structural or anatomic abnormalities   If treatment provided at initial evaluation, no treatment charged due to lack of authorization.

## 2024-09-07 NOTE — Progress Notes (Unsigned)
   Established Patient Office Visit  Subjective   Patient ID: Frances [name], female    DOB: [birth year]  Age: 48 y.o. MRN: [id number]  Chief Complaint  Patient presents with   Medical Management of Chronic Issues    HPI 48 y/o female with PMH of T2DM, DDD, and fibromyalgia presenting today for 3 month follow up. She is compliant on Metformin  1000 mg BID. She denied making any dietary changes. Her diet is high in sugar and junk food. She does not exercise regularly. She denies any vision changes, numbness, or tingling. Checks her blood sugar in the morning and denies any episodes of hypoglycemia.   Today she wants to address a pea-sized bump on the top of her head that has been there for a couple of years. Usually she notices it coming and going, but it has stayed around for a while this time. She says it has never grown in size and it is not painful.   She also wants to discuss dizziness that she has been having for a couple of months. She describes it as though she is going to pass out, her vision will go black for a few seconds, and she has to stabilize herself by holding onto something. It is worse when she changes positions such as sitting to standing. She has not fainted and she denies headaches.   She is also requesting a Toradol  shot for back pain.   {History (Optional):23778}  ROS    Objective:     There were no vitals taken for this visit. {Vitals History (Optional):23777}  Physical Exam   No results found for any visits on 09/07/24.  {Labs (Optional):23779}  The 10-year ASCVD risk score (Arnett DK, et al., 2019) is: 1.3%    Assessment & Plan:   Problem List Items Addressed This Visit       Endocrine   Diabetes mellitus (HCC) - Primary    No follow-ups on file.    [address] [place], Student-PA

## 2024-09-08 ENCOUNTER — Ambulatory Visit (INDEPENDENT_AMBULATORY_CARE_PROVIDER_SITE_OTHER): Payer: MEDICAID | Admitting: Professional

## 2024-09-08 ENCOUNTER — Encounter: Payer: Self-pay | Admitting: Professional

## 2024-09-08 DIAGNOSIS — F900 Attention-deficit hyperactivity disorder, predominantly inattentive type: Secondary | ICD-10-CM

## 2024-09-08 DIAGNOSIS — F319 Bipolar disorder, unspecified: Secondary | ICD-10-CM

## 2024-09-08 DIAGNOSIS — F411 Generalized anxiety disorder: Secondary | ICD-10-CM

## 2024-09-08 NOTE — Progress Notes (Signed)
   Frances [name], [facility]

## 2024-09-09 ENCOUNTER — Encounter: Payer: Self-pay | Admitting: Physician Assistant

## 2024-09-09 ENCOUNTER — Ambulatory Visit: Payer: MEDICAID | Admitting: Professional

## 2024-09-09 DIAGNOSIS — L7211 Pilar cyst: Secondary | ICD-10-CM | POA: Insufficient documentation

## 2024-09-09 DIAGNOSIS — R42 Dizziness and giddiness: Secondary | ICD-10-CM | POA: Insufficient documentation

## 2024-09-12 NOTE — Progress Notes (Deleted)
 [facility] Counselor/Therapist Progress Note  Patient ID: @NAME @, MRN: @MRN @    Date: @T @  Time Spent: ***  {onmampm:26702} - *** {onmampm:26702} : *** Minutes  Treatment Type: Individual Therapy.  Reported Symptoms: ***  Mental Status Exam: Appearance:  {PSY:22683}     Behavior: {PSY:21022743}  Motor: {PSY:22302}  Speech/Language:  {PSY:22685}  Affect: {PSY:22687}  Mood: {PSY:31886}  Thought process: {PSY:31888}  Thought content:   {PSY:[phone]}  Sensory/Perceptual disturbances:   {[phone]}  Orientation: {PSY:30297}  Attention: {PSY:22877}  Concentration: {[phone]}  Memory: {PSY:956-805-8558}  Fund of knowledge:  {[phone]}  Insight:   {[phone]}  Judgment:  {[phone]}  Impulse Control: {[phone]}   Risk Assessment: Danger to Self:  {PSY:22692} Self-injurious Behavior: {PSY:22692} Danger to Others: {PSY:22692} Duty to Warn:{PSY:311194} Physical Aggression / Violence:{PSY:21197} Access to Firearms a concern: {PSY:21197} Gang Involvement:{PSY:21197}  Subjective:   @NAME @ participated in the session, in person in the office with the therapist, and consented to treatment [name] reviewed the events of the past week.   ***   We reviewed numerous treatment approaches including CBT, BA, Problem Solving, and Solution focused therapy. Psych-education regarding the [name]'s diagnosis of @DIAG @ was provided during the session. We discussed @NAME @'s goals treatment goals which include ***. @NAME @ provided verbal approval of the treatment plan.    Interventions: Psycho-education & Goal Setting.   Diagnosis:   @DIAG @  Psychiatric Treatment: {YES/NO:21197}, ***  Treatment Plan:  Client Abilities/Strengths [name] ***  Support System: ***  Client Treatment Preferences ***  Client Statement of Needs [name] would like to ***   Treatment Level {Frequency of sessions.:26745}  Symptoms  ***   (Status:  {Symptom Status:26744}) ***   (Status: {Symptom Status:26744})  Goals:   [name] experiences symptoms of ***  Treatment plan signed and available on s-drive:  {Yes or No Tx Eojw:67267}  @NAME @ was sent the treatment plan signature form on ***.   Target Date: *** Frequency: Biweekly  Progress: 0 Modality: individual    Therapist will provide referrals for additional resources as appropriate.  Therapist will provide psycho-education regarding [name]'s diagnosis and corresponding treatment approaches and interventions. @MECREDENTIALS @ will support the patient's ability to achieve the goals identified. will employ CBT, BA, Problem-solving, Solution Focused, Mindfulness,  coping skills, & other evidenced-based practices will be used to promote progress towards healthy functioning to help manage decrease symptoms associated with her diagnosis.   Reduce overall level, frequency, and intensity of the feelings of depression, anxiety and panic evidenced by decreased depressed mood from 6 to 7 days/week to 0 to 1 days/week per client report for at least 3 consecutive months. Verbally express understanding of the relationship between feelings of depression, anxiety and their impact on thinking patterns and behaviors. Verbalize an understanding of the role that distorted thinking plays in creating fears, excessive worry, and ruminations.  ([name] participated in the creation of the treatment plan)    @MECREDENTIALS @               Nathanel Collet, [facility]

## 2024-09-12 NOTE — Progress Notes (Signed)
 Frances [name] Progress Note  Patient ID: Frances [name], MRN: [id number],    Date: 09/08/2024  Time Spent: 57 minutes 1106am-1203pm   Treatment Type: Individual Therapy  Risk Assessment: Danger to Self:  No Self-injurious Behavior: No Danger to Others: No  Subjective: The patient arrived on time for her in person therapy session.  Issues addressed: 1-first session a-pt felt good about the first session b-she thinks that she will be able to work with me c-therapist provided some items the pt considered -sent brother and sister an email and they did not respond -he asked her sister if it was spam d-t is back to sleeping okay -taking her Vicodan at night and she is waking up and is not "crippled" because of her back and could not stand up straight 2-treatment planning a-goals for treatment 1-With her father, issues with abandonment with him that 'm not good enough because he started a new family and it makes me feel like trash (does not use work dad, corrected and said her father) He would only come for Christmas or holiday but not often. 16 in college is when she decided not to speak to him anymore He told her sister that if she wanted her money for Christmas she would contact her and she never did Last saw her father at brother's college graduation and father asked her to stop over and  2- I really need to stay focused when I am talking especially when I am stuck I will try and go somewhere else In therapy its usually if she gets stuck on a hard issue 3-understanding of when she does feel horrible (RA) Help in understanding with pain and her flares b-pt and Clinician developed treatment plan during session -pt actively participated and agrees with her treatment plan  Treatment Plan Problems: Depression, Financial Stress, Grief /Loss Unresolved, Medical Issues Symptoms: A diagnosis of a chronic illness that is not life-threatening but  necessitates changes in living. A long-term lack of discipline in money management that has led to excessive indebtedness. A feeling of low self-esteem and hopelessness that is associated with the lack of sufficient income to cover the cost of living. Loss of income due to unemployment. Strong emotional response of sadness exhibited when losses are discussed. Serial losses in life (i.e., deaths, divorces, jobs) that led to depression and discouragement. Sad affect, social withdrawal, anxiety, loss of interest in activities, and low energy. History of chronic or recurrent depression for which the client has taken antidepressant medication, been hospitalized, had outpatient treatment, or had a course of electroconvulsive therapy. Poor concentration and indecisiveness. Sleeplessness or hypersomnia. Low self-esteem. Lack of energy. Diminished interest in or enjoyment of activities. Depressed or irritable mood. Avoidance of talking on anything more than a superficial level about the loss. Unresolved grief issues. Lack of appetite, weight loss, and/or insomnia as well as other depression signs that occurred since the loss. Decrease or loss of appetite. Goals: Accept emotional support from those who care, without pushing them away in anger. Achieve an inner strength to control personal impulses, cravings, and desires that directly or indirectly increase debt irresponsibly. Begin a healthy grieving process around the loss. Develop an awareness of how the avoidance of grieving has affected life and begin the healing process. Gain a new sense of self-worth in which the substance of one's value is not attached to the capacity to do things or own things that cost money. Live life to the fullest extent possible, even though remaining time  may be limited. Recognize, accept, and cope with feelings of depression. Resolve financial crisis with a path to eliminate debt. Work through the grieving process and  face with peace the reality of own death. Objectives target date for all objectives is 09/08/2025: Identify the losses or limitations that have been experienced due to the medical condition. Identify feelings associated with the medical condition. Work with therapist to develop a plan for coping with stress. Learn and implement skills for managing stress. Implement mindfulness techniques for relapse prevention. Identify and replace thoughts and beliefs that support depression. Learn and implement behavioral strategies to overcome depression. Identify and voice positives about the deceased loved one including previous positive experiences, positive characteristics, positive aspects of the relationship, and how these things may be remembered. Set financial goals and make budgetary decisions with partner, allowing for equal input and balanced control over financial matters. Write a budget that balances income with expenses. Identify priorities that should control how money is spent. Begin verbalizing feelings associated with the loss. Identify how avoiding dealing with loss has negatively impacted life. Identify what stages of grief have been experienced in the continuum of the grieving process. Describe history, symptoms, and treatment of the medical condition. Interventions: Review the client's budget as to reasonableness and completeness. Reinforce changes in managing money that reflect compromise, responsible planning, and respectful cooperation with the client's partner. Ask the client to list the priorities that he/she believes should give direction to how his/her money is spent; process those priorities. Review the client's spending history to discover what priorities and values have misdirected spending. Ask the client to list ways that avoidance of grieving has negatively impacted his/her life. Ask the client to discuss and/or list the positive aspects of and memories about his/her  relationship with the lost loved one; reinforce the client's expression of positive memories and emotions (e.g., smiling, laughing); encourage the client to share these thoughts with supportive loved ones. Educate the client on the stages of the grieving process and answer any questions he/she may have. Assist the client in identifying the stages of grief that he/she has experienced and which stage he/she is presently working through. Ask the client to bring pictures or mementos connected with his/her loss to a session and talk about them (or assign Creating a Patent examiner in the Adult Psychotherapy Administrator, arts by Jenniffer). Assist the client in identifying and expressing feelings connected with his/her loss. In a collaborative fashion, develop a therapeutic alliance while gathering a history of the condition, including symptoms, client's reactions to the diagnosis, treatments of the condition, and prognosis. Assist the client in developing a tailored coping action plan for preventing and/or managing identified stressful reactions using skills such as relaxation, exercise, cognitive reframing, and problem-solving. Conduct skills training, building upon effective coping strategies the client possesses, and teaching new skills tailored to the specific stressor. Assist the client in identifying, sorting through, and verbalizing the various feelings generated by his/her medical condition. Ask the client to list the changes, losses, or limitations that have resulted from the medical condition (or assign The Impact of My Illness in the Adult Psychotherapy Homework Planner by Jenniffer). Assign the client to self-monitor thoughts, feelings, and actions in daily journal (e.g., Negative Thoughts Trigger Negative Feelings in the Adult Psychotherapy Homework Planner by Jenniffer; Daily Record of Dysfunctional Thoughts in Cognitive Therapy of Depression by Almarie Candida Gentry and Shona); process the journal  material to challenge depressive thinking patterns and replace them with reality-based thoughts. Assign behavioral experiments  in which depressive automatic thoughts are treated as hypotheses/prediction, reality-based alternative hypotheses/prediction are generated, and both are tested against the client's past, present, and/or future experiences. Assist the client in developing skills that increase the likelihood of deriving pleasure from behavioral activation (e.g., assertiveness skills, developing an exercise plan, less internal/more external focus, increased social involvement); reinforce success. Use mindfulness meditation and cognitive therapy techniques to help the client learn to recognize and regulate the negative thought processes associated with depression and to change his/her relationship with these thoughts (see Mindfulness-Based Cognitive Therapy for Depression by Kriste Pouch, and Jil). Work to increase the client's new sense of well-being by building his/her personal strengths evident in their progress through therapy (or assign Acknowledging My Strengths and/or What Are My Good Qualities? in the Adult Psychotherapy Homework Planner by Jenniffer).  Diagnosis:Bipolar I disorder (HCC)  Attention deficit hyperactivity disorder (ADHD), predominantly inattentive type  Generalized anxiety disorder  Plan:  -meet again on Monday, September 19, 2024 at 10a online.

## 2024-09-13 ENCOUNTER — Ambulatory Visit (INDEPENDENT_AMBULATORY_CARE_PROVIDER_SITE_OTHER): Payer: MEDICAID

## 2024-09-13 ENCOUNTER — Ambulatory Visit
Admission: EM | Admit: 2024-09-13 | Discharge: 2024-09-13 | Disposition: A | Discharge status: Home / Self Care | Payer: MEDICAID | Attending: Family Medicine | Admitting: Family Medicine

## 2024-09-13 ENCOUNTER — Other Ambulatory Visit: Payer: Self-pay

## 2024-09-13 DIAGNOSIS — S9031XA Contusion of right foot, initial encounter: Secondary | ICD-10-CM

## 2024-09-13 DIAGNOSIS — S99921A Unspecified injury of right foot, initial encounter: Secondary | ICD-10-CM

## 2024-09-13 NOTE — Discharge Instructions (Signed)
 Continue ice and elevation to reduce pain and swelling Take pain medicines if needed Limit walking while foot is painful See your doctor if not improved by next week

## 2024-09-13 NOTE — ED Provider Notes (Signed)
 Frances [name]    CSN: [id number] Arrival date & time: 09/13/24  0935      History   Chief Complaint Chief Complaint  Patient presents with   Foot Injury    HPI Frances [name] is a 48 y.o. female.   HPI  Patient accidentally rolled her barn door over her foot and it is very swollen and bruised.  Is here for evaluation.  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[facility])     Patient Active Problem List   Diagnosis Date Noted   Pilar cyst of scalp 09/09/2024   Dizziness 09/09/2024   Degeneration of intervertebral disc of lumbar region with discogenic back pain and lower extremity pain 06/08/2024   Urinary retention 06/07/2024   Carpal tunnel syndrome, bilateral 02/25/2024   Pain in left third proximal interphalangeal joint 10/16/2023   Personal history UTI 09/14/2023   Rheumatoid arthritis 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 (HCC) 09/14/2023   Urinary hesitancy 06/30/2023   Urinary frequency 06/16/2023   OAB (overactive bladder) 06/16/2023   Chronic pain syndrome 06/10/2023   Crush injury of right foot 05/25/2023   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   Microalbuminuria 09/09/2022   Night sweats 06/18/2022   Proteinuria 03/17/2022   Chronic right hip pain 01/06/2022   Impingement syndrome, shoulder, right 01/06/2022   ETD (Eustachian tube dysfunction), bilateral 08/23/2021   SOB (shortness of breath) on exertion 08/20/2021   Overweight (BMI 25.0-29.9) 06/18/2021   Attention deficit hyperactivity disorder (ADHD), 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   Hyperlipidemia LDL goal <70 03/18/2021   Diabetes mellitus (HCC)  03/13/2021   Seasonal allergies 12/25/2020   Symptomatic mammary hypertrophy 09/04/2020   Recurrent major depressive disorder, in partial remission 05/24/2020   Generalized anxiety disorder 05/24/2020   DDD (degenerative disc disease), cervical 03/13/2020   Ingrown right big toenail 03/13/2020   Labral tear of right hip joint, degenerative 01/11/2020   Grief reaction 11/28/2019   Borderline personality disorder (HCC) 01/22/2018   Seronegative rheumatoid arthritis (HCC) 12/09/2017   New daily persistent headache 09/26/2017   MTHFR gene mutation 09/20/2017   Bipolar I disorder (HCC) 09/16/2017   Chronic fatigue syndrome 09/16/2017   Back pain 10/13/2008   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   Lumbar spondylosis 02/10/2008   FIBROMYALGIA 02/10/2008   Other specified abnormal findings of blood chemistry 02/10/2008    Past Surgical History:  Procedure Laterality Date   BREAST REDUCTION SURGERY Bilateral 01/07/2024   Procedure: Bilateral breast reduction with liposuction;  Surgeon: Lowery Estefana RAMAN, DO;  Location: [facility];  Service: Plastics;  Laterality: Bilateral;   COLONOSCOPY WITH PROPOFOL  N/A 02/25/2017   Procedure: COLONOSCOPY WITH PROPOFOL ;  Surgeon: Elsie Cree, MD;  Location: [facility];  Service: Endoscopy;  Laterality: N/A;   left knee lateral release  1993   scar tisue removal  03/2005   left ankle   TONSILLECTOMY  age 84's    OB History     Gravida  0   Para  0   Term  0   Preterm  0   AB  0   Living  0      SAB  0   IAB  0   Ectopic  0   Multiple  0   Live Births               Home Medications    Prior to Admission medications   Medication Sig Start Date End Date Taking? Authorizing Provider  ARIPiprazole  (ABILIFY ) 15 MG tablet Take 1 tablet (15 mg total) by mouth daily. 08/11/24   Starkes-Perry, Majel RAMAN, FNP  atorvastatin  (LIPITOR) 20 MG tablet TAKE 1 TABLET BY MOUTH DAILY 06/23/24   Breeback, Jade L, PA-C   Azelastine  HCl 137 MCG/SPRAY SOLN USE 2 SPRAY IN ALTERNATE NOSTRILS TWICE A DAY 06/29/24   Breeback, Jade L, PA-C  blood glucose meter kit and supplies KIT Dispense based on patient and insurance preference. Use up to four times daily as directed. 05/29/21   Breeback, Jade L, PA-C  buPROPion  (WELLBUTRIN  XL) 300 MG 24 hr tablet Take 1 tablet (300 mg total) by mouth daily. 08/11/24   Wilkie Majel RAMAN, FNP  cholecalciferol (VITAMIN D3) 25 MCG (1000 UT) tablet Take 1,000 Units by mouth daily.    [provider]  diclofenac  (VOLTAREN ) 75 MG EC tablet Take 1 tablet (75 mg total) by mouth 2 (two) times daily. 10/19/22   Maranda Jamee Jacob, MD  diclofenac  Sodium (VOLTAREN ) 1 % GEL Apply 2 g topically 4 (four) times daily. To affected joint. 10/16/23   Curtis Debby PARAS, MD  DULoxetine  HCl 30 MG CSDR Take 90 mg by mouth daily.    [provider]  fluticasone  (FLONASE ) 50 MCG/ACT nasal spray 2 SPRAY IN EACH NOSTRIL DAILY 06/23/24   Breeback, Jade L, PA-C  HYDROcodone -acetaminophen  (NORCO/VICODIN) 5-325 MG tablet Take one to one-half tablet every 12 hours for chronic pain. 09/07/24   Breeback, Jade L, PA-C  hydroxychloroquine (PLAQUENIL) 200 MG tablet Take 200 mg by mouth 2 (two) times daily. 200mg  twice daily on weekdays and 200mg  once daily on weekends as of 12/03/22 review    [provider]  levocetirizine (XYZAL ) 5 MG tablet TAKE 1 TABLET BY MOUTH IN THE EVENING 07/26/24   Curtis Debby PARAS, MD  metFORMIN  (GLUCOPHAGE ) 1000 MG tablet Take 1 tablet (1,000 mg total) by mouth 2 (two) times daily with a meal. 09/07/24   Breeback, Jade L, PA-C  montelukast  (SINGULAIR ) 10 MG tablet TAKE 1 TABLET BY MOUTH AT BEDTIME 08/12/24   Breeback, Jade L, PA-C  pregabalin  (LYRICA ) 200 MG capsule TAKE 1 CAPSULE BY MOUTH 2-3 TIMES DAILY 07/26/24   Curtis Debby PARAS, MD  Semaglutide , 1 MG/DOSE, (OZEMPIC , 1 MG/DOSE,) 4 MG/3ML SOPN Inject 1 mg into the skin once a week. 09/07/24   Breeback, Jade  L, PA-C  solifenacin  (VESICARE ) 10 MG tablet Take 1 tablet (10 mg total) by mouth daily. 08/13/23   Stoneking, Adine PARAS., MD  tamsulosin  (FLOMAX ) 0.4 MG CAPS capsule Take 1 capsule (0.4 mg total) by mouth daily. 06/07/24   Breeback, Jade L, PA-C  tiZANidine  (ZANAFLEX ) 4 MG tablet TAKE 1 TABLET BY MOUTH EVERY 6 HOURS AS NEEDED FOR MUSCLE SPASMS 08/03/24   Breeback, Jade L, PA-C    Family History Family History  Problem Relation Age of Onset   Diabetes Mother    Stroke Mother    Lupus Mother    Hypertension Mother    Congestive Heart Failure Mother    Mental illness Brother        Not clear what his  diagnosis is--may be related to previous drug use.   Cancer Maternal Grandmother        colon   Diabetes Maternal Grandfather    Depression Maternal Uncle    Alcohol abuse Maternal Uncle     Social History Social History   Tobacco Use   Smoking status: Never   Smokeless tobacco: Never  Vaping Use   Vaping status: Never Used  Substance Use Topics   Alcohol use: No    Alcohol/week: 0.0 standard drinks of alcohol   Drug use: No     Allergies   Metaxalone   Review of Systems Review of Systems  See HPI Physical Exam Triage Vital Signs ED Triage Vitals  Encounter Vitals Group     BP 09/13/24 0958 97/63     Girls Systolic BP Percentile --      Girls Diastolic BP Percentile --      Boys Systolic BP Percentile --      Boys Diastolic BP Percentile --      Pulse Rate 09/13/24 0958 81     Resp 09/13/24 0958 16     Temp 09/13/24 0958 98.2 F (36.8 C)     Temp src --      SpO2 09/13/24 0958 97 %     Weight --      Height --      Head Circumference --      Peak Flow --      Pain Score 09/13/24 1003 3     Pain Loc --      Pain Education --      Exclude from Growth Chart --    No data found.  Updated Vital Signs BP 97/63   Pulse 81   Temp 98.2 F (36.8 C)   Resp 16   SpO2 97%       Physical Exam Constitutional:      General: She is not in acute distress.     Appearance: She is well-developed.  HENT:     Head: Normocephalic and atraumatic.  Eyes:     Conjunctiva/sclera: Conjunctivae normal.     Pupils: Pupils are equal, round, and reactive to light.  Cardiovascular:     Rate and Rhythm: Normal rate.  Pulmonary:     Effort: Pulmonary effort is normal. No respiratory distress.  Musculoskeletal:        General: Normal range of motion.     Cervical back: Normal range of motion.       Feet:  Skin:    General: Skin is warm and dry.  Neurological:     Mental Status: She is alert.     Gait: Gait abnormal.      UC Treatments / Results  Labs (all labs ordered are listed, but only abnormal results are displayed) Labs Reviewed - No data to display  EKG   Radiology DG Foot Complete Right Result Date: 09/13/2024 CLINICAL DATA:  Foot injury 1 week prior to imaging with pain, swelling, and bruising along the foot EXAM: RIGHT FOOT COMPLETE - 3+ VIEW COMPARISON:  05/25/2023 radiographs FINDINGS: Plantar and small Achilles calcaneal spurs. Chronic spurring along the posterior subtalar facet with associated loss of articular space medially, difficult to completely exclude fibrous talocalcaneal coalition in the vicinity of the sustentaculum tali. No acute fracture or malalignment is identified. Minimal dorsal soft tissue swelling along the forefoot. Chronically fragmented osteophyte dorsal to the Lisfranc joint. IMPRESSION: 1. No acute fracture or malalignment is identified. 2. Chronic spurring along the  posterior subtalar facet with associated loss of articular space medially, difficult to completely exclude fibrous talocalcaneal coalition in the vicinity of the sustentaculum tali. 3. Plantar and Achilles calcaneal spurs. 4. Minimal dorsal soft tissue swelling along the forefoot. Electronically Signed   By: Ryan Salvage M.D.   On: 09/13/2024 10:51    Procedures Procedures (including critical [name] time)  Medications Ordered in UC Medications - No  data to display  Initial Impression / Assessment and Plan / UC Course  I have reviewed the triage vital signs and the nursing notes.  Pertinent labs & imaging results that were available during my [name] of the patient were reviewed by me and considered in my medical decision making (see chart for details).     Patient is placed into a postop shoe so can ambulate more comfortably Final Clinical Impressions(s) / UC Diagnoses   Final diagnoses:  Contusion of right foot, initial encounter     Discharge Instructions      Continue ice and elevation to reduce pain and swelling Take pain medicines if needed Limit walking while foot is painful See your doctor if not improved by next week   ED Prescriptions   None    PDMP not reviewed this encounter.   Maranda Jamee Jacob, MD 09/13/24 [phone]

## 2024-09-13 NOTE — ED Triage Notes (Signed)
 Right foot pain since last Wednesday, pulled a door over foot. Normally takes vicodin and diclofenac . No additional otc meds.

## 2024-09-15 ENCOUNTER — Ambulatory Visit: Payer: MEDICAID

## 2024-09-15 ENCOUNTER — Ambulatory Visit: Payer: MEDICAID | Admitting: Sports Medicine

## 2024-09-15 DIAGNOSIS — R293 Abnormal posture: Secondary | ICD-10-CM

## 2024-09-15 DIAGNOSIS — G8929 Other chronic pain: Secondary | ICD-10-CM

## 2024-09-15 DIAGNOSIS — M6281 Muscle weakness (generalized): Secondary | ICD-10-CM

## 2024-09-15 DIAGNOSIS — M25511 Pain in right shoulder: Secondary | ICD-10-CM | POA: Diagnosis not present

## 2024-09-15 NOTE — Therapy (Signed)
 OUTPATIENT PHYSICAL THERAPY SHOULDER TREATMENT   Patient Name: Frances [name] MRN: [id number] DOB:February 05, 1976, 48 y.o., female Today's Date: 09/15/2024  END OF SESSION:  PT End of Session - 09/15/24 1103     Visit Number 2    Number of Visits 16    Date for Recertification  11/02/24    Authorization Type [employer]    Authorization Time Period auth required    Authorization - Visit Number 2    Authorization - Number of Visits 5    PT Start Time 1105    PT Stop Time 1143    PT Time Calculation (min) 38 min    Activity Tolerance Patient tolerated treatment well    Behavior During Therapy WFL for tasks assessed/performed          Past Medical History:  Diagnosis Date   Anxiety    Arthritis    Back pain, chronic    Bipolar 1 disorder (HCC)    Chronic fatigue syndrome    DDD (degenerative disc disease), lumbar    Depression    Diabetes mellitus without complication (HCC)    Fibromyalgia    IUD 2012   Mirena   MTHFR gene mutation    Rheumatoid arteritis (HCC)    Past Surgical History:  Procedure Laterality Date   BREAST REDUCTION SURGERY Bilateral 01/07/2024   Procedure: Bilateral breast reduction with liposuction;  Surgeon: Lowery Estefana RAMAN, DO;  Location: [facility];  Service: Plastics;  Laterality: Bilateral;   COLONOSCOPY WITH PROPOFOL  N/A 02/25/2017   Procedure: COLONOSCOPY WITH PROPOFOL ;  Surgeon: Elsie Cree, MD;  Location: [facility];  Service: Endoscopy;  Laterality: N/A;   left knee lateral release  1993   scar tisue removal  03/2005   left ankle   TONSILLECTOMY  age 40's   Patient Active Problem List   Diagnosis Date Noted   Pilar cyst of scalp 09/09/2024   Dizziness 09/09/2024   Degeneration of intervertebral disc of lumbar region with discogenic back pain and lower extremity pain 06/08/2024   Urinary retention 06/07/2024   Carpal tunnel syndrome, bilateral 02/25/2024   Pain in left third proximal interphalangeal joint  10/16/2023   Personal history UTI 09/14/2023   Rheumatoid arthritis 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 (HCC) 09/14/2023   Urinary hesitancy 06/30/2023   Urinary frequency 06/16/2023   OAB (overactive bladder) 06/16/2023   Chronic pain syndrome 06/10/2023   Crush injury of right foot 05/25/2023   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   Microalbuminuria 09/09/2022   Night sweats 06/18/2022   Proteinuria 03/17/2022   Chronic right hip pain 01/06/2022   Impingement syndrome, shoulder, right 01/06/2022   ETD (Eustachian tube dysfunction), bilateral 08/23/2021   SOB (shortness of breath) on exertion 08/20/2021   Overweight (BMI 25.0-29.9) 06/18/2021   Attention deficit hyperactivity disorder (ADHD), 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   Hyperlipidemia LDL goal <70 03/18/2021   Diabetes mellitus (HCC) 03/13/2021   Seasonal allergies 12/25/2020   Symptomatic mammary hypertrophy 09/04/2020   Recurrent major depressive disorder, in partial remission 05/24/2020   Generalized anxiety disorder 05/24/2020   DDD (degenerative disc disease), cervical 03/13/2020   Ingrown right big toenail 03/13/2020   Labral tear of right hip joint, degenerative 01/11/2020   Grief reaction 11/28/2019   Borderline personality disorder (HCC) 01/22/2018   Seronegative rheumatoid arthritis (HCC) 12/09/2017   New daily persistent headache 09/26/2017   MTHFR gene mutation 09/20/2017   Bipolar I disorder (HCC) 09/16/2017   Chronic fatigue syndrome 09/16/2017  Back pain 10/13/2008   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   Lumbar spondylosis 02/10/2008   FIBROMYALGIA 02/10/2008   Other specified abnormal findings of blood chemistry 02/10/2008    PCP: Vermell LITTIE Bologna, PA-C  REFERRING PROVIDER: Dr Redell DELENA Robes  REFERRING DIAG: R RC strain; shoulder impingement; chronic R shoulder pain  THERAPY DIAG:  Chronic right shoulder pain  Muscle weakness (generalized)  Abnormal  posture  Rationale for Evaluation and Treatment: Rehabilitation  ONSET DATE: 03/22/24  SUBJECTIVE:                                                                                                                                                                                      SUBJECTIVE STATEMENT: Patient reports 1-2/10 in shoulder today; states she has not moved it much this morning.   EVAL: Patient reports that she has had R shoulder pain for 5-10 years with increased symptoms this year. Received injection 4/25 and again 08/23/24 with some improvement but shoulder is still hurting. She does not know of any injury or irritation. Likes to sleep with arms overhead and that irritates shoulder. Has wrist braces that she is supposed to wear when she is sleeping but she has not been using them.    Hand dominance: Right  PERTINENT HISTORY: Anxiety; arthritis; chronic back pain; bipolar disorder; CFS; DDD: depression; DM; fibromyalgia; rheumatoid arteritis; fibromyalgia Chronic fatigue syndrome; Labral tear of Rt hip   PAIN:  Are you having pain? Yes: NPRS scale: 1-2/10  Pain location: anterior R shoulder  Pain description: dull; aching  Aggravating factors: scooping horse poop; sleeping with arms overhead Relieving factors: injection   PRECAUTIONS: None  RED FLAGS: None   WEIGHT BEARING RESTRICTIONS: No  FALLS:  Has patient fallen in last 6 months? No  LIVING ENVIRONMENT: Lives with: lives with their friend  Lives in: House/apartment Stairs: Yes: Internal: 1 steps; none and External: 3 steps; none Has following equipment at home: None  OCCUPATION: Not working; active with horse riding and care; laundry; household chores irregularly   PLOF: Independent  PATIENT GOALS:decrease shoulder pain and learn some exercises for shoulder   NEXT MD VISIT: 10/04/24  OBJECTIVE:  Note: Objective measures were completed at Evaluation unless otherwise noted.  DIAGNOSTIC FINDINGS:  4/24  MRI R shoulder - Moderate distal supraspinatus and mild distal infraspinatus tendinosis with bursal sided fraying. No high-grade or retracted cuff tear. No significant muscle atrophy.   Mild subacromial-subdeltoid bursitis.   Moderate intra-articular long head biceps tendinosis.   Mild glenohumeral and AC joint osteoarthritis.  PATIENT SURVEYS:  Quick DASH - 40.9/100; 40.9%   COGNITION: Overall cognitive status: Within functional limits for tasks assessed  SENSATION: Sometimes awakens with tingling thumbs bilat   POSTURE: Patient presents with head forward posture with increased thoracic kyphosis; shoulders rounded and elevated; scapulae abducted and rotated along the thoracic spine; head of the humerus anterior in orientation.   UPPER EXTREMITY ROM:   Active ROM Right eval Left eval  Shoulder flexion 136 pain 140  Shoulder extension 55 pain pop 61  Shoulder abduction 151 pain 160  Shoulder adduction    Shoulder internal rotation T7 pop T 7  Shoulder external rotation 65 pain 90  Elbow flexion    Elbow extension    Wrist flexion    Wrist extension    Wrist ulnar deviation    Wrist radial deviation    Wrist pronation    Wrist supination    (Blank rows = not tested)  UPPER EXTREMITY MMT:  MMT Right eval Left eval  Shoulder flexion 5 pain  5  Shoulder extension 5 5  Shoulder abduction 5 pain  5  Shoulder adduction    Shoulder internal rotation 5 pain  5  Shoulder external rotation 5 pain  5  Middle trapezius 4 4  Lower trapezius 4 4  Elbow flexion    Elbow extension    Wrist flexion    Wrist extension    Wrist ulnar deviation    Wrist radial deviation    Wrist pronation    Wrist supination    Grip strength (lbs)    (Blank rows = not tested)   PALPATION:  Muscular tightness R > L ant/lat/posterior cervical musculature; pecs; upper trap; leveator; R anterior shoulder/biceps tendon area                                                                                                                               [facility] Adult PT Treatment:                                                DATE: 09/15/2024 Therapeutic Exercise: 3-way doorway pec stretch 3x30 UT & LS stretches 2x30 (bil) Neuromuscular re-ed: Shoulder isometrics (Rt) --> flexion, abd, ER 10x5 each Wall slides flexion & scaption x 5 each Therapeutic Activity: Seated cervical retraction 10x5 Scapula retraction 10x10 Standing with back against noodle Shoulder ER & scap retraction 10x5 W arms open/close 10x10   TREATMENT DATE: POC; HEP    PATIENT EDUCATION: Education details: Edited HEP for better compliance  Person educated: Patient Education method: Programmer, multimedia, Demonstration, Actor cues, Verbal cues, and Handouts Education comprehension: verbalized understanding, returned demonstration, verbal cues required, tactile cues required, and needs further education  HOME EXERCISE PROGRAM: Access Code: [email or web address] URL: [email or web address] Date: 09/15/2024 Prepared by: Lamarr Price  Exercises - Seated Cervical Retraction  - 2 x daily - 7 x weekly - 1-2 sets - 5-10 reps - 10 sec  hold - Seated  Passive Cervical Retraction  - 2 x daily - 7 x weekly - 1 sets - 5-10 reps - 5-10 sec  hold - Seated Scapular Retraction  - 2 x daily - 7 x weekly - 1-2 sets - 10 reps - 10 sec  hold - Shoulder External Rotation and Scapular Retraction  - 1-2 x daily - 7 x weekly - 1 sets - 10 reps - 3-5 sec   hold - Standing Shoulder W at Wall  - 1-2 x daily - 7 x weekly - 1 sets - 10 reps - 3 sec  hold - Doorway Pec Stretch at 60 Degrees Abduction  - 3 x daily - 7 x weekly - 1 sets - 3 reps - Doorway Pec Stretch at 90 Degrees Abduction  - 3 x daily - 7 x weekly - 1 sets - 3 reps - 30 seconds  hold - Doorway Pec Stretch at 120 Degrees Abduction  - 3 x daily - 7 x weekly - 1 sets - 3 reps - 30 second hold  hold  ASSESSMENT:  CLINICAL IMPRESSION: HEP edited down to improve  patient compliance with daily exercises. Shoulder isometrics incorporated to progress strength and pain-free muscle activation. Wall slides on flexion and scaption planes completed with minimal pain. Encouraged patient to incorporate HEP throughout her day and continues postural awareness to progress compliance and independence with HEP.  EVAL: Patient is a 48 y.o. female who was seen today for physical therapy evaluation and treatment for chronic R shoulder pain; rotator cuff strain; shoulder impingement syndrome. [name] has a history of chronic shoulder pain over the past 5-10 years. She received an injection for R shoulder 4/25 and again 08/23/24. Signs and symptoms are consistent with impingement R shoulder. Patient presents with poor posture and alignment; postural weakness; limited and painful R shoulder ROM; muscular tightness and pain with palpation in R shoulder girdle; limited functional activities and pain on a daily basis. Patient will benefit from PT to address problems identified.   OBJECTIVE IMPAIRMENTS: decreased activity tolerance, decreased ROM, decreased strength, impaired UE functional use, improper body mechanics, postural dysfunction, and pain.   ACTIVITY LIMITATIONS: carrying, lifting, and reach over head  PARTICIPATION LIMITATIONS: laundry and caring for horses   PERSONAL FACTORS: Behavior pattern, Fitness, Past/current experiences, Time since onset of injury/illness/exacerbation, and  Comorbidities as noted in history above are also affecting patient's functional outcome.   REHAB POTENTIAL: Good  CLINICAL DECISION MAKING: Evolving/moderate complexity  EVALUATION COMPLEXITY: Moderate   GOALS: Goals reviewed with patient? Yes  SHORT TERM GOALS: Target date: 10/05/2024  Independent in initial HEP  Baseline: Goal status: INITIAL  2.  Increase AROM R shoulder to equal AROM L shoulder  Baseline:  Goal status: INITIAL  3.  Patient demonstrates proper sitting and  standing posture to improve scapular and shoulder position  Baseline:  Goal status: INITIAL   LONG TERM GOALS: Target date: 11/02/2024  Decrease pain R shoulder by 50% allowing patient to increase functional activity level  Baseline:  Goal status: INITIAL  2.  5/5 strength posterior shoulder girdle musculature with patient to demonstrate improve postural control to improve GH mechanics and decrease impingement  Baseline:  Goal status: INITIAL  3.  Improve Quick DASH score by 10 points  Baseline: 40.9/100  Goal status: INITIAL  4.  Independent in advanced HEP  Baseline:  Goal status: INITIAL   PLAN:  PT FREQUENCY: 1-2x/week  PT DURATION: 8 weeks  PLANNED INTERVENTIONS: 97164- PT Re-evaluation, 97110-Therapeutic exercises, 97530- Therapeutic  activity, [id number]- Neuromuscular re-education, [phone]- Self Care, [id number]- Manual therapy, Patient/Family education, Taping, and Joint mobilization  PLAN FOR NEXT SESSION: continue with postural correction and education; review and progress exercises; manual work and modalities as indicated    Lamarr GORMAN Price, PTA 09/15/2024, 11:44 AM

## 2024-09-19 ENCOUNTER — Encounter: Payer: Self-pay | Admitting: Professional

## 2024-09-19 ENCOUNTER — Ambulatory Visit: Payer: MEDICAID | Admitting: Professional

## 2024-09-19 DIAGNOSIS — F900 Attention-deficit hyperactivity disorder, predominantly inattentive type: Secondary | ICD-10-CM | POA: Diagnosis not present

## 2024-09-19 DIAGNOSIS — F319 Bipolar disorder, unspecified: Secondary | ICD-10-CM

## 2024-09-19 DIAGNOSIS — F411 Generalized anxiety disorder: Secondary | ICD-10-CM | POA: Diagnosis not present

## 2024-09-19 DIAGNOSIS — F3341 Major depressive disorder, recurrent, in partial remission: Secondary | ICD-10-CM

## 2024-09-19 NOTE — Progress Notes (Signed)
 [facility] Counselor/Therapist Progress Note  Patient ID: Frances [name], MRN: [id number],    Date: 09/19/2024  Time Spent: 47 minutes 1003-1050am   Treatment Type: Individual Therapy  Risk Assessment: Danger to Self:  No Self-injurious Behavior: No Danger to Others: No  Subjective: The patient arrived on time for her in person therapy session.  Issues addressed: 1-financial -phone issues -she does not plan to place a fraud claim against the phone carrier 2-cousin went missing in May -his body was found and they are saying it was a PTSD reaction -he was from [place], [place] and she cannot make the trip -she plans to make contact with her aunt -the date that he was found was the same date her cousin was found 11 years earlier  3-grief/loss -she found that her father is still living -her sister posted a picture and she did not want to see him and has that picture etched in her mind -greatest loss I want to say my father but I'm unsure that's true   -she then said it was  -loss of work- 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 -injured at work in 2009 and got a settlement -she has been trying to get disability since 2009 -loss of mother she accepts; she has feelings of guilty for not being there especially toward the end -she did not feel right about leaving her animals behind -her mother lived in [place]  and she could not get there more frequently -when she would go her SIL would make comments about all the work being on her brother -she used to go home yearly to spent Christmas until 2010 when she started paying for a place for her animals -her mom always had to work two jobs and 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 A long-term lack of discipline in money management that has led to excessive indebtedness. A feeling of low self-esteem and hopelessness that is associated with the lack of sufficient income to cover the cost of living. Loss of income due to unemployment. Strong emotional response of sadness exhibited when losses are discussed. Serial losses in life (i.e., deaths, divorces, jobs) that led to depression and discouragement. Sad affect, social withdrawal, anxiety, loss of interest in activities, and low energy. History of chronic or recurrent depression for which the client has taken antidepressant medication, been hospitalized, had outpatient treatment, or had a course of electroconvulsive therapy. Poor concentration and indecisiveness. Sleeplessness or hypersomnia. Low self-esteem. Lack of energy. Diminished interest in or enjoyment of activities. Depressed or irritable mood. Avoidance of talking on anything more than a superficial level about the loss. Unresolved grief issues. Lack of appetite, weight loss, and/or insomnia as well as other depression signs that occurred since the loss. Decrease or loss of appetite. Goals: Accept emotional support from those who care, without pushing them away in anger. Achieve an inner strength to control personal impulses, cravings, and desires that directly or indirectly increase debt irresponsibly. Begin a healthy  grieving process around the loss. Develop an awareness of how the avoidance of grieving has affected life and begin the healing process. Gain a new sense of self-worth in which the substance of one's value is not attached to the capacity to do things or own things that cost money. Live  life to the fullest extent possible, even though remaining time may be limited. Recognize, accept, and cope with feelings of depression. Resolve financial crisis with a path to eliminate debt. Work through the grieving process and face with peace the reality of own death. Objectives target date for all objectives is 09/08/2025: Identify the losses or limitations that have been experienced due to the medical condition. Identify feelings associated with the medical condition. Work with therapist to develop a plan for coping with stress. Learn and implement skills for managing stress. Implement mindfulness techniques for relapse prevention. Identify and replace thoughts and beliefs that support depression. Learn and implement behavioral strategies to overcome depression. Identify and voice positives about the deceased loved one including previous positive experiences, positive characteristics, positive aspects of the relationship, and how these things may be remembered. Set financial goals and make budgetary decisions with partner, allowing for equal input and balanced control over financial matters. Write a budget that balances income with expenses. Identify priorities that should control how money is spent. Begin verbalizing feelings associated with the loss. Identify how avoiding dealing with loss has negatively impacted life. Identify what stages of grief have been experienced in the continuum of the grieving process. Describe history, symptoms, and treatment of the medical condition. Interventions: Review the client's budget as to reasonableness and completeness. Reinforce changes in managing money that reflect compromise, responsible planning, and respectful cooperation with the client's partner. Ask the client to list the priorities that he/she believes should give direction to how his/her money is spent; process those priorities. Review the client's spending history to discover what  priorities and values have misdirected spending. Ask the client to list ways that avoidance of grieving has negatively impacted his/her life. Ask the client to discuss and/or list the positive aspects of and memories about his/her relationship with the lost loved one; reinforce the client's expression of positive memories and emotions (e.g., smiling, laughing); encourage the client to share these thoughts with supportive loved ones. Educate the client on the stages of the grieving process and answer any questions he/she may have. Assist the client in identifying the stages of grief that he/she has experienced and which stage he/she is presently working through. Ask the client to bring pictures or mementos connected with his/her loss to a session and talk about them (or assign Creating a Patent examiner in the Adult Psychotherapy Administrator, arts by Jenniffer). Assist the client in identifying and expressing feelings connected with his/her loss. In a collaborative fashion, develop a therapeutic alliance while gathering a history of the condition, including symptoms, client's reactions to the diagnosis, treatments of the condition, and prognosis. Assist the client in developing a tailored coping action plan for preventing and/or managing identified stressful reactions using skills such as relaxation, exercise, cognitive reframing, and problem-solving. Conduct skills training, building upon effective coping strategies the client possesses, and teaching new skills tailored to the specific stressor. Assist the client in identifying, sorting through, and verbalizing the various feelings generated by his/her medical condition. Ask the client to list the changes, losses, or limitations that have resulted from the medical condition (or assign The Impact of My Illness in the Adult Psychotherapy Homework Planner by Jenniffer).  Assign the client to self-monitor thoughts, feelings, and actions in daily journal  (e.g., Negative Thoughts Trigger Negative Feelings in the Adult Psychotherapy Homework Planner by Jenniffer; Daily Record of Dysfunctional Thoughts in Cognitive Therapy of Depression by Almarie Candida Gentry and Shona); process the journal material to challenge depressive thinking patterns and replace them with reality-based thoughts. Assign behavioral experiments in which depressive automatic thoughts are treated as hypotheses/prediction, reality-based alternative hypotheses/prediction are generated, and both are tested against the client's past, present, and/or future experiences. Assist the client in developing skills that increase the likelihood of deriving pleasure from behavioral activation (e.g., assertiveness skills, developing an exercise plan, less internal/more external focus, increased social involvement); reinforce success. Use mindfulness meditation and cognitive therapy techniques to help the client learn to recognize and regulate the negative thought processes associated with depression and to change his/her relationship with these thoughts (see Mindfulness-Based Cognitive Therapy for Depression by Kriste Pouch, and Jil). Work to increase the client's new sense of well-being by building his/her personal strengths evident in their progress through therapy (or assign Acknowledging My Strengths and/or What Are My Good Qualities? in the Adult Psychotherapy Homework Planner by Jenniffer).  Diagnosis:Bipolar I disorder (HCC)  Generalized anxiety disorder  Recurrent major depressive disorder, in partial remission  Attention deficit hyperactivity disorder (ADHD), predominantly inattentive type  Plan:  -write a letter -meet again on Tuesday, October 04, 2024 at [employer].

## 2024-09-21 ENCOUNTER — Other Ambulatory Visit: Payer: Self-pay | Admitting: Family Medicine

## 2024-09-21 ENCOUNTER — Ambulatory Visit (INDEPENDENT_AMBULATORY_CARE_PROVIDER_SITE_OTHER): Payer: MEDICAID | Admitting: Physician Assistant

## 2024-09-21 ENCOUNTER — Other Ambulatory Visit: Payer: Self-pay | Admitting: Physician Assistant

## 2024-09-21 VITALS — BP 125/72 | HR 70

## 2024-09-21 DIAGNOSIS — R0989 Other specified symptoms and signs involving the circulatory and respiratory systems: Secondary | ICD-10-CM

## 2024-09-21 DIAGNOSIS — J3489 Other specified disorders of nose and nasal sinuses: Secondary | ICD-10-CM

## 2024-09-21 DIAGNOSIS — R202 Paresthesia of skin: Secondary | ICD-10-CM

## 2024-09-21 DIAGNOSIS — S9781XD Crushing injury of right foot, subsequent encounter: Secondary | ICD-10-CM | POA: Diagnosis not present

## 2024-09-21 DIAGNOSIS — J310 Chronic rhinitis: Secondary | ICD-10-CM

## 2024-09-21 NOTE — Progress Notes (Unsigned)
   Acute Office Visit  Subjective:     Patient ID: Frances [name], female    DOB: 1976/09/22, 48 y.o.   MRN: [id number]  Chief Complaint  Patient presents with   Foot Pain    HPI Patient with pmh of spondylitis and T2DM is in today for right foot pain and bilateral tingling in the dorsal surfaces of the feet.   She was seen at [facility] ([facility] a week ago for a right foot injury. Xray revealed no acute fractures. Was given a postop shoe to decrease weight bearing. She states the pain and swelling has gone down and it is getting better with each day. She has not been icing it, and she stopped wearing the postop shoe about 2 days ago.   She also wants to talk about the tingling on the top of her feet. She has had intermittent tingling on the dorsum of her right foot in the past along with radiculopathy in down the right posterior leg. Tingling in the left foot began a few days ago. She states it is irritated by shoes and her bed sheets on her feet. She denies radiculopathy in the left leg. Has had some increased back pain recently. She is seeing sports medicine tomorrow. Denies numbness or pain.     ROS See HPI.      Objective:    BP 125/72 (BP Location: Right Arm, Patient Position: Sitting, Cuff Size: Normal)   Pulse 70  BP Readings from Last 3 Encounters:  09/22/24 120/70  09/23/24 125/72  09/13/24 97/63   Wt Readings from Last 3 Encounters:  09/22/24 173 lb (78.5 kg)  09/07/24 174 lb (78.9 kg)  08/23/24 173 lb (78.5 kg)      Physical Exam Constitutional:      Appearance: Normal appearance.  HENT:     Head: Normocephalic.  Cardiovascular:     Rate and Rhythm: Normal rate and regular rhythm.  Pulmonary:     Effort: Pulmonary effort is normal.     Breath sounds: Normal breath sounds.  Musculoskeletal:     Comments: Bruising over dorsal right foot after crushing injury. No warmth, redness or swelling.  NROM of both feet.  Strength 5/5 of both feet.  2+ pedal pulses.    Neurological:     General: No focal deficit present.     Mental Status: She is alert and oriented to person, place, and time.  Psychiatric:        Mood and Affect: Mood normal.          Assessment & Plan:  SABRASABRAMachell Wirthlin was seen today for foot pain.  Diagnoses and all orders for this visit:  Crushing injury of right foot, subsequent encounter  Paresthesia of both feet -     Ambulatory referral to Neurology   Continue to treat crushing injury of right foot with RICE.  Get back in walking boot.  Scheduled EMG for paresthesias of feet.  A1C controlled.  Ongoing chronic back pain but does not sound like typical radiculopathy.  On lyrica   Vermell Bologna, PA-C

## 2024-09-21 NOTE — Patient Instructions (Signed)
 Ice foot twice a day.  Get back in walking boot.  Scheduled EMGs.

## 2024-09-22 ENCOUNTER — Ambulatory Visit (INDEPENDENT_AMBULATORY_CARE_PROVIDER_SITE_OTHER): Payer: MEDICAID

## 2024-09-22 VITALS — BP 120/70 | Ht 67.0 in | Wt 173.0 lb

## 2024-09-22 DIAGNOSIS — M47816 Spondylosis without myelopathy or radiculopathy, lumbar region: Secondary | ICD-10-CM

## 2024-09-22 DIAGNOSIS — M48061 Spinal stenosis, lumbar region without neurogenic claudication: Secondary | ICD-10-CM

## 2024-09-22 NOTE — Progress Notes (Signed)
   Subjective:    Patient ID: Frances [name], female    DOB: 48 y.o., [birth year]   MRN: [id number]  HPI  Chief Complaint: Lower back pain  PMHx of fibromyalgia, degenerative disc disease, horseshoe kidney, bipolar and/or borderline personality disorder, and homzygous MTHFR mutation   Patient reports complete resolution of radicular type symptoms following the epidural steroid injection she received in 05/2024 and 07/2024 at the L4-5 level on the right.  MRI of the lumbar spine obtained on 05/07/2024 showing... IMPRESSION: 1. Worsened spondylosis at L4-5 where there is now moderately severe to severe central canal stenosis and narrowing in both subarticular recesses with impingement on the descending L5 roots. Mild to moderate bilateral foraminal narrowing is also present at L4-5. 2. No change in moderate central canal stenosis at L3-4. 3. Horseshoe kidney.  Patient states she is interested in injection of different area of the spine if we believe this is likely to benefit her Overall considers herself to be doing very well Has a list of back specific HEP exercises but has not been doing them regularly    Objective:   Physical Exam Vitals:   09/22/24 0906  BP: 120/70    Lumbar Spine -Inspection: no swelling or skin changes -Palpation: TTP - midline, - paraspinals -AROM/PROM: FROM in all planes of the low back -Strength: full hip flexion (L1/L2), knee extension (L3/4), ankle dorsiflexion (L4/5), hip extension (L5/S1), knee flexion (L5/S1/S2) plantarflexion (S1/2). -Sensation: intact sensation over the medial femoral condyle (L3), patella (L4), lateral femoral condyle (L5), lateral malleolus (S1). -Reflexes: normal patellar (L3/4), hamstring (L5/S1), achilles (S1/2) reflexes, equal bilaterally -Special tests: - Straight Leg Raise, + Stork, - Slump test    Assessment & Plan:   Ilyana is a 48 y.o. female with known lumbar spondylosis, moderate to severe central canal  stenosis, and neuroforaminal stenosis presenting for follow-up.  Given that her radiculopathy is resolved with epidural steroid injections but some of her intrinsic back pain and does not have a radicular pattern is still present.  I believe it would be reasonable to request a facet joint injection of the affected areas of her lumbar spine to see if this can provide her additional benefit.  I will place a request for this and we will see if this can be done sooner given that her ESI was done on 08/04/2024.  Provided back brace today which we fitted her for per patient preference (last back brace we provided her at last visit was too big). Continue home exercises provided by PT from earlier this year

## 2024-09-23 ENCOUNTER — Encounter: Payer: Self-pay | Admitting: Physician Assistant

## 2024-09-28 ENCOUNTER — Encounter: Payer: Self-pay | Admitting: Rehabilitative and Restorative Service Providers"

## 2024-09-28 ENCOUNTER — Ambulatory Visit: Payer: MEDICAID | Admitting: Rehabilitative and Restorative Service Providers"

## 2024-09-28 DIAGNOSIS — M25511 Pain in right shoulder: Secondary | ICD-10-CM | POA: Diagnosis present

## 2024-09-28 DIAGNOSIS — G8929 Other chronic pain: Secondary | ICD-10-CM | POA: Insufficient documentation

## 2024-09-28 DIAGNOSIS — R29898 Other symptoms and signs involving the musculoskeletal system: Secondary | ICD-10-CM | POA: Diagnosis present

## 2024-09-28 DIAGNOSIS — M6281 Muscle weakness (generalized): Secondary | ICD-10-CM | POA: Diagnosis present

## 2024-09-28 DIAGNOSIS — R293 Abnormal posture: Secondary | ICD-10-CM | POA: Insufficient documentation

## 2024-09-28 NOTE — Therapy (Signed)
 OUTPATIENT PHYSICAL THERAPY SHOULDER TREATMENT   Patient Name: Frances [name] MRN: [id number] DOB:July 04, 1976, 48 y.o., female Today's Date: 09/28/2024  END OF SESSION:  PT End of Session - 09/28/24 1019     Visit Number 3    Number of Visits 16    Date for Recertification  11/02/24    Authorization Type [employer]    Authorization Time Period auth required    Authorization - Visit Number 3    Authorization - Number of Visits 5    PT Start Time 1018    PT Stop Time 1100    PT Time Calculation (min) 42 min    Activity Tolerance Patient tolerated treatment well          Past Medical History:  Diagnosis Date   Anxiety    Arthritis    Back pain, chronic    Bipolar 1 disorder (HCC)    Chronic fatigue syndrome    DDD (degenerative disc disease), lumbar    Depression    Diabetes mellitus without complication (HCC)    Fibromyalgia    IUD 2012   Mirena   MTHFR gene mutation    Rheumatoid arteritis (HCC)    Past Surgical History:  Procedure Laterality Date   BREAST REDUCTION SURGERY Bilateral 01/07/2024   Procedure: Bilateral breast reduction with liposuction;  Surgeon: Lowery Estefana RAMAN, DO;  Location: [facility];  Service: Plastics;  Laterality: Bilateral;   COLONOSCOPY WITH PROPOFOL  N/A 02/25/2017   Procedure: COLONOSCOPY WITH PROPOFOL ;  Surgeon: Elsie Cree, MD;  Location: [facility];  Service: Endoscopy;  Laterality: N/A;   left knee lateral release  1993   scar tisue removal  03/2005   left ankle   TONSILLECTOMY  age 83's   Patient Active Problem List   Diagnosis Date Noted   Paresthesia of both feet 09/21/2024   Pilar cyst of scalp 09/09/2024   Dizziness 09/09/2024   Degeneration of intervertebral disc of lumbar region with discogenic back pain and lower extremity pain 06/08/2024   Urinary retention 06/07/2024   Carpal tunnel syndrome, bilateral 02/25/2024   Pain in left third proximal interphalangeal joint 10/16/2023   Personal  history UTI 09/14/2023   Rheumatoid arthritis 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 (HCC) 09/14/2023   Urinary hesitancy 06/30/2023   Urinary frequency 06/16/2023   OAB (overactive bladder) 06/16/2023   Chronic pain syndrome 06/10/2023   Crush injury of right foot 05/25/2023   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   Microalbuminuria 09/09/2022   Night sweats 06/18/2022   Proteinuria 03/17/2022   Chronic right hip pain 01/06/2022   Impingement syndrome, shoulder, right 01/06/2022   ETD (Eustachian tube dysfunction), bilateral 08/23/2021   SOB (shortness of breath) on exertion 08/20/2021   Overweight (BMI 25.0-29.9) 06/18/2021   Attention deficit hyperactivity disorder (ADHD), 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   Hyperlipidemia LDL goal <70 03/18/2021   Diabetes mellitus (HCC) 03/13/2021   Seasonal allergies 12/25/2020   Symptomatic mammary hypertrophy 09/04/2020   Recurrent major depressive disorder, in partial remission 05/24/2020   Generalized anxiety disorder 05/24/2020   DDD (degenerative disc disease), cervical 03/13/2020   Ingrown right big toenail 03/13/2020   Labral tear of right hip joint, degenerative 01/11/2020   Grief reaction 11/28/2019   Borderline personality disorder (HCC) 01/22/2018   Seronegative rheumatoid arthritis (HCC) 12/09/2017   New daily persistent headache 09/26/2017   MTHFR gene mutation 09/20/2017   Bipolar I disorder (HCC) 09/16/2017   Chronic fatigue syndrome 09/16/2017   Back pain  10/13/2008   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   Lumbar spondylosis 02/10/2008   FIBROMYALGIA 02/10/2008   Other specified abnormal findings of blood chemistry 02/10/2008    PCP: Vermell LITTIE Bologna, PA-C  REFERRING PROVIDER: Dr Redell DELENA Robes  REFERRING DIAG: R RC strain; shoulder impingement; chronic R shoulder pain  THERAPY DIAG:  Chronic right shoulder pain  Muscle weakness (generalized)  Abnormal posture  Other symptoms and signs  involving the musculoskeletal system  Rationale for Evaluation and Treatment: Rehabilitation  ONSET DATE: 03/22/24  SUBJECTIVE:                                                                                                                                                                                      SUBJECTIVE STATEMENT: Patient reports the shoulder is about the same. Doing some shoulder exercises. She still catches herself sleeping with the R arm tucked under head at times. Has to take L arm to move R arm down. She has been riding some which does not seem to irritate shoulder. She does have increased pain in the shoulder from scooping poop at the barn. Still in walking shoe for bruise R foot ~ 3 weeks ago.   EVAL: Patient reports that she has had R shoulder pain for 5-10 years with increased symptoms this year. Received injection 4/25 and again 08/23/24 with some improvement but shoulder is still hurting. She does not know of any injury or irritation. Likes to sleep with arms overhead and that irritates shoulder. Has wrist braces that she is supposed to wear when she is sleeping but she has not been using them.    Hand dominance: Right  PERTINENT HISTORY: Anxiety; arthritis; chronic back pain; bipolar disorder; CFS; DDD: depression; DM; fibromyalgia; rheumatoid arteritis; fibromyalgia Chronic fatigue syndrome; Labral tear of Rt hip   PAIN:  Are you having pain? Yes: NPRS scale: 4/10  Pain location: anterior R shoulder  Pain description: dull; aching  Aggravating factors: scooping horse poop; sleeping with arms overhead Relieving factors: injection   PRECAUTIONS: None   WEIGHT BEARING RESTRICTIONS: No  FALLS:  Has patient fallen in last 6 months? No  LIVING ENVIRONMENT: Lives with: lives with their friend  Lives in: House/apartment Stairs: Yes: Internal: 1 steps; none and External: 3 steps; none Has following equipment at home: None  OCCUPATION: Not working; active with  horse riding and care; laundry; household chores irregularly   PATIENT GOALS:decrease shoulder pain and learn some exercises for shoulder   NEXT MD VISIT: 10/04/24  OBJECTIVE:  Note: Objective measures were completed at Evaluation unless otherwise noted.  DIAGNOSTIC FINDINGS:  4/24 MRI R shoulder - Moderate distal supraspinatus and mild  distal infraspinatus tendinosis with bursal sided fraying. No high-grade or retracted cuff tear. No significant muscle atrophy.   Mild subacromial-subdeltoid bursitis.   Moderate intra-articular long head biceps tendinosis.   Mild glenohumeral and AC joint osteoarthritis.  PATIENT SURVEYS:  Quick DASH - 40.9/100; 40.9%      SENSATION: Sometimes awakens with tingling thumbs bilat   POSTURE: Patient presents with head forward posture with increased thoracic kyphosis; shoulders rounded and elevated; scapulae abducted and rotated along the thoracic spine; head of the humerus anterior in orientation.   UPPER EXTREMITY ROM:   Active ROM Right eval Left eval  Shoulder flexion 136 pain 140  Shoulder extension 55 pain pop 61  Shoulder abduction 151 pain 160  Shoulder adduction    Shoulder internal rotation T7 pop T 7  Shoulder external rotation 65 pain 90  Elbow flexion    Elbow extension    Wrist flexion    Wrist extension    Wrist ulnar deviation    Wrist radial deviation    Wrist pronation    Wrist supination    (Blank rows = not tested)  UPPER EXTREMITY MMT:  MMT Right eval Left eval  Shoulder flexion 5 pain  5  Shoulder extension 5 5  Shoulder abduction 5 pain  5  Shoulder adduction    Shoulder internal rotation 5 pain  5  Shoulder external rotation 5 pain  5  Middle trapezius 4 4  Lower trapezius 4 4  Elbow flexion    Elbow extension    Wrist flexion    Wrist extension    Wrist ulnar deviation    Wrist radial deviation    Wrist pronation    Wrist supination    Grip strength (lbs)    (Blank rows = not  tested)   PALPATION:  Muscular tightness R > L ant/lat/posterior cervical musculature; pecs; upper trap; leveator; R anterior shoulder/biceps tendon area                                                                                                                              [facility] Adult PT Treatment:                                                DATE: 09/28/2024 Therapeutic Exercise: Pulley flexion 5 sec x 10  3-way doorway pec stretch 3x30 Corner stretch 3 positions 1x30 UT & LS stretches 2x30 (bil) Neuromuscular re-ed: Shoulder isometrics (Rt) --> flexion, abd, ER 10x5 each Wall slides flexion & scaption hand on washcloth x 10 each Therapeutic Activity: Seated cervical retraction 10x5 Lateral cervical flexion 5 sec x 5 R/L  Backward shoulder rolls Reciprocal backward shoulder rolls  Scapula retraction 10x10 with noodle  Standing with back against noodle Shoulder ER & scap retraction 10x5 W arms open/close 10x10 Self care: Sleep with  tennis ball at front of shoulder so she will not roll onto R shoulder at night   [facility] Adult PT Treatment:                                                DATE: 09/15/2024 Therapeutic Exercise: 3-way doorway pec stretch 3x30 UT & LS stretches 2x30 (bil) Neuromuscular re-ed: Shoulder isometrics (Rt) --> flexion, abd, ER 10x5 each Wall slides flexion & scaption x 5 each Therapeutic Activity: Seated cervical retraction 10x5 Scapula retraction 10x10 Standing with back against noodle Shoulder ER & scap retraction 10x5 W arms open/close 10x10  PATIENT EDUCATION: Education details: Edited HEP for better compliance  Person educated: Patient Education method: Programmer, multimedia, Demonstration, Actor cues, Verbal cues, and Handouts Education comprehension: verbalized understanding, returned demonstration, verbal cues required, tactile cues required, and needs further education  HOME EXERCISE PROGRAM: Access Code: [email or web address] URL:  [email or web address] Date: 09/28/2024 Prepared by: Ellington Greenslade  Exercises - Seated Cervical Retraction  - 2 x daily - 7 x weekly - 1-2 sets - 5-10 reps - 10 sec  hold - Seated Passive Cervical Retraction  - 2 x daily - 7 x weekly - 1 sets - 5-10 reps - 5-10 sec  hold - Seated Scapular Retraction  - 2 x daily - 7 x weekly - 1-2 sets - 10 reps - 10 sec  hold - Shoulder External Rotation and Scapular Retraction  - 1-2 x daily - 7 x weekly - 1 sets - 10 reps - 3-5 sec   hold - Standing Shoulder W at Wall  - 1-2 x daily - 7 x weekly - 1 sets - 10 reps - 3 sec  hold - Doorway Pec Stretch at 60 Degrees Abduction  - 3 x daily - 7 x weekly - 1 sets - 3 reps - Doorway Pec Stretch at 90 Degrees Abduction  - 3 x daily - 7 x weekly - 1 sets - 3 reps - 30 seconds  hold - Doorway Pec Stretch at 120 Degrees Abduction  - 3 x daily - 7 x weekly - 1 sets - 3 reps - 30 second hold  hold - Seated Cervical Sidebending AROM  - 2 x daily - 7 x weekly - 1 sets - 3-5 reps - 5-10 sec  hold - Standing Backward Shoulder Rolls  - 2 x daily - 7 x weekly - 1 sets - 10 reps - 1-2 sec  hold  ASSESSMENT:  CLINICAL IMPRESSION: Discussed and tried modification of pec stretch placing hands out to side of door more for home as well as corner stretch for home. Continued with current HEP to improve patient compliance with daily exercises. Continued shoulder isometrics to progress strength in pain-free range. Wall slides on flexion and scaption planes completed with minimal pain. Again encouraged patient to incorporate HEP throughout her day. Working toward improved postural awareness to improve functional activity tolerance. Encouraged compliance with HEP.  EVAL: Patient is a 48 y.o. female who was seen today for physical therapy evaluation and treatment for chronic R shoulder pain; rotator cuff strain; shoulder impingement syndrome. Frances [name] has a history of chronic shoulder pain over the past 5-10 years. She received an  injection for R shoulder 4/25 and again 08/23/24. Signs and symptoms are consistent with impingement R shoulder. Patient presents with poor posture and alignment; postural weakness; limited and painful  R shoulder ROM; muscular tightness and pain with palpation in R shoulder girdle; limited functional activities and pain on a daily basis. Patient will benefit from PT to address problems identified.   OBJECTIVE IMPAIRMENTS: decreased activity tolerance, decreased ROM, decreased strength, impaired UE functional use, improper body mechanics, postural dysfunction, and pain.    GOALS: Goals reviewed with patient? Yes  SHORT TERM GOALS: Target date: 10/05/2024  Independent in initial HEP  Baseline: Goal status: INITIAL  2.  Increase AROM R shoulder to equal AROM L shoulder  Baseline:  Goal status: INITIAL  3.  Patient demonstrates proper sitting and standing posture to improve scapular and shoulder position  Baseline:  Goal status: INITIAL   LONG TERM GOALS: Target date: 11/02/2024  Decrease pain R shoulder by 50% allowing patient to increase functional activity level  Baseline:  Goal status: INITIAL  2.  5/5 strength posterior shoulder girdle musculature with patient to demonstrate improve postural control to improve GH mechanics and decrease impingement  Baseline:  Goal status: INITIAL  3.  Improve Quick DASH score by 10 points  Baseline: 40.9/100  Goal status: INITIAL  4.  Independent in advanced HEP  Baseline:  Goal status: INITIAL   PLAN:  PT FREQUENCY: 1-2x/week  PT DURATION: 8 weeks  PLANNED INTERVENTIONS: 97164- PT Re-evaluation, 97110-Therapeutic exercises, 97530- Therapeutic activity, 97112- Neuromuscular re-education, 97535- Self Care, [id number]- Manual therapy, Patient/Family education, Taping, and Joint mobilization  PLAN FOR NEXT SESSION: continue with postural correction and education; review and progress exercises; manual work and modalities as indicated   Progress with TB strengthening as tolerated    Harrol Novello P Abdulrahman Bracey, PT 09/28/2024, 10:19 AM

## 2024-10-03 ENCOUNTER — Ambulatory Visit: Payer: MEDICAID

## 2024-10-03 DIAGNOSIS — R29898 Other symptoms and signs involving the musculoskeletal system: Secondary | ICD-10-CM

## 2024-10-03 DIAGNOSIS — R293 Abnormal posture: Secondary | ICD-10-CM

## 2024-10-03 DIAGNOSIS — M25511 Pain in right shoulder: Secondary | ICD-10-CM | POA: Diagnosis not present

## 2024-10-03 DIAGNOSIS — M6281 Muscle weakness (generalized): Secondary | ICD-10-CM

## 2024-10-03 DIAGNOSIS — G8929 Other chronic pain: Secondary | ICD-10-CM

## 2024-10-03 NOTE — Therapy (Signed)
 OUTPATIENT PHYSICAL THERAPY SHOULDER TREATMENT   Patient Name: Frances [name] MRN: [id number] DOB:1976-04-08, 48 y.o., female Today's Date: 10/03/2024  END OF SESSION:  PT End of Session - 10/03/24 1446     Visit Number 4    Number of Visits 16    Date for Recertification  11/02/24    Authorization Type [employer]    Authorization Time Period 16 visits approved 09/28/2024 -    PT Start Time 1445    PT Stop Time 1527    PT Time Calculation (min) 42 min    Activity Tolerance Patient tolerated treatment well    Behavior During Therapy WFL for tasks assessed/performed          Past Medical History:  Diagnosis Date   Anxiety    Arthritis    Back pain, chronic    Bipolar 1 disorder (HCC)    Chronic fatigue syndrome    DDD (degenerative disc disease), lumbar    Depression    Diabetes mellitus without complication (HCC)    Fibromyalgia    IUD 2012   Mirena   MTHFR gene mutation    Rheumatoid arteritis (HCC)    Past Surgical History:  Procedure Laterality Date   BREAST REDUCTION SURGERY Bilateral 01/07/2024   Procedure: Bilateral breast reduction with liposuction;  Surgeon: Lowery Estefana RAMAN, DO;  Location: [facility];  Service: Plastics;  Laterality: Bilateral;   COLONOSCOPY WITH PROPOFOL  N/A 02/25/2017   Procedure: COLONOSCOPY WITH PROPOFOL ;  Surgeon: Elsie Cree, MD;  Location: [facility];  Service: Endoscopy;  Laterality: N/A;   left knee lateral release  1993   scar tisue removal  03/2005   left ankle   TONSILLECTOMY  age 56's   Patient Active Problem List   Diagnosis Date Noted   Paresthesia of both feet 09/21/2024   Pilar cyst of scalp 09/09/2024   Dizziness 09/09/2024   Degeneration of intervertebral disc of lumbar region with discogenic back pain and lower extremity pain 06/08/2024   Urinary retention 06/07/2024   Carpal tunnel syndrome, bilateral 02/25/2024   Pain in left third proximal interphalangeal joint 10/16/2023    Personal history UTI 09/14/2023   Rheumatoid arthritis 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 (HCC) 09/14/2023   Urinary hesitancy 06/30/2023   Urinary frequency 06/16/2023   OAB (overactive bladder) 06/16/2023   Chronic pain syndrome 06/10/2023   Crush injury of right foot 05/25/2023   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   Microalbuminuria 09/09/2022   Night sweats 06/18/2022   Proteinuria 03/17/2022   Chronic right hip pain 01/06/2022   Impingement syndrome, shoulder, right 01/06/2022   ETD (Eustachian tube dysfunction), bilateral 08/23/2021   SOB (shortness of breath) on exertion 08/20/2021   Overweight (BMI 25.0-29.9) 06/18/2021   Attention deficit hyperactivity disorder (ADHD), 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   Hyperlipidemia LDL goal <70 03/18/2021   Diabetes mellitus (HCC) 03/13/2021   Seasonal allergies 12/25/2020   Symptomatic mammary hypertrophy 09/04/2020   Recurrent major depressive disorder, in partial remission 05/24/2020   Generalized anxiety disorder 05/24/2020   DDD (degenerative disc disease), cervical 03/13/2020   Ingrown right big toenail 03/13/2020   Labral tear of right hip joint, degenerative 01/11/2020   Grief reaction 11/28/2019   Borderline personality disorder (HCC) 01/22/2018   Seronegative rheumatoid arthritis (HCC) 12/09/2017   New daily persistent headache 09/26/2017   MTHFR gene mutation 09/20/2017   Bipolar I disorder (HCC) 09/16/2017   Chronic fatigue syndrome 09/16/2017   Back pain 10/13/2008   Nonorganic  sleep disorder 02/10/2008   Lumbar spondylosis 02/10/2008   FIBROMYALGIA 02/10/2008   Other specified abnormal findings of blood chemistry 02/10/2008    PCP: Vermell LITTIE Bologna, PA-C  REFERRING PROVIDER: Dr Redell DELENA Robes  REFERRING DIAG: R RC strain; shoulder impingement; chronic R shoulder pain  THERAPY DIAG:  Chronic right shoulder pain  Muscle weakness (generalized)  Abnormal posture  Other symptoms and  signs involving the musculoskeletal system  Rationale for Evaluation and Treatment: Rehabilitation  ONSET DATE: 03/22/24  SUBJECTIVE:                                                                                                                                                                                      SUBJECTIVE STATEMENT: Patient reports 5/10 pain on lateral shoulder when reaching behind her; patient states her shoulder aches at rest. Patient states she has gotten back to riding her horse, states that doesn't bother her shoulder. Patient states she wakes up and when finds her arm above her head; states she has not tried the tennis ball cue recommended at last session. Patient continues to have pain in shoulder when reaching across the body.   EVAL: Patient reports that she has had R shoulder pain for 5-10 years with increased symptoms this year. Received injection 4/25 and again 08/23/24 with some improvement but shoulder is still hurting. She does not know of any injury or irritation. Likes to sleep with arms overhead and that irritates shoulder. Has wrist braces that she is supposed to wear when she is sleeping but she has not been using them.    Hand dominance: Right  PERTINENT HISTORY: Anxiety; arthritis; chronic back pain; bipolar disorder; CFS; DDD: depression; DM; fibromyalgia; rheumatoid arteritis; fibromyalgia Chronic fatigue syndrome; Labral tear of Rt hip   PAIN:  Are you having pain? Yes: NPRS scale: 5/10  Pain location: anterior R shoulder  Pain description: dull; aching  Aggravating factors: reaching behind the body; sleeping with arms overhead Relieving factors: injection   PRECAUTIONS: None   WEIGHT BEARING RESTRICTIONS: No  FALLS:  Has patient fallen in last 6 months? No  LIVING ENVIRONMENT: Lives with: lives with their friend  Lives in: House/apartment Stairs: Yes: Internal: 1 steps; none and External: 3 steps; none Has following equipment at home:  None  OCCUPATION: Not working; active with horse riding and care; laundry; household chores irregularly   PATIENT GOALS:decrease shoulder pain and learn some exercises for shoulder   NEXT MD VISIT: 10/04/24  OBJECTIVE:  Note: Objective measures were completed at Evaluation unless otherwise noted.  DIAGNOSTIC FINDINGS:  4/24 MRI R shoulder - Moderate distal supraspinatus and mild distal infraspinatus tendinosis with bursal  sided fraying. No high-grade or retracted cuff tear. No significant muscle atrophy.   Mild subacromial-subdeltoid bursitis.   Moderate intra-articular long head biceps tendinosis.   Mild glenohumeral and AC joint osteoarthritis.  PATIENT SURVEYS:  Quick DASH - 40.9/100; 40.9%      SENSATION: Sometimes awakens with tingling thumbs bilat   POSTURE: Patient presents with head forward posture with increased thoracic kyphosis; shoulders rounded and elevated; scapulae abducted and rotated along the thoracic spine; head of the humerus anterior in orientation.   UPPER EXTREMITY ROM:   Active ROM Right eval Left eval Right 10/03/24 Left 10/03/24  Shoulder flexion 136 pain 140 152 153  Shoulder extension 55 pain pop 61 42 43  Shoulder abduction 151 pain 160 155 155  Shoulder adduction      Shoulder internal rotation T7 pop T 7 T7 T7  Shoulder external rotation 65 pain 90 68 65  Elbow flexion      Elbow extension      Wrist flexion      Wrist extension      Wrist ulnar deviation      Wrist radial deviation      Wrist pronation      Wrist supination      (Blank rows = not tested)  UPPER EXTREMITY MMT:  MMT Right eval Left eval  Shoulder flexion 5 pain  5  Shoulder extension 5 5  Shoulder abduction 5 pain  5  Shoulder adduction    Shoulder internal rotation 5 pain  5  Shoulder external rotation 5 pain  5  Middle trapezius 4 4  Lower trapezius 4 4  Elbow flexion    Elbow extension    Wrist flexion    Wrist extension    Wrist ulnar  deviation    Wrist radial deviation    Wrist pronation    Wrist supination    Grip strength (lbs)    (Blank rows = not tested)   PALPATION:  Muscular tightness R > L ant/lat/posterior cervical musculature; pecs; upper trap; leveator; R anterior shoulder/biceps tendon area                                                                                                                        [facility] Adult PT Treatment:                                                DATE: 10/03/2024 Therapeutic Exercise: Shoulder ROM measurements (see above) UBE L2 x 2 min fwd, 2 min bkwd Doorway pec stretch 2 x 30 sec Corner pec stretch 3 x 30 sec Neuromuscular re-ed: Wall clock + yellow TB (bil) Standing with back against noodle: Scapula retraction 5x10 sec Shoulder ER & scap retraction 10x5 sec W arms open/close 10x10 sec W arms + red TB 10x3 sec Therapeutic Activity: Shoulder flexion wall slides + 1#DB  SA wall slides x 10  Lift off in overhead V arms at wall 10 x 3 sec   [facility] Adult PT Treatment:                                                DATE: 09/28/2024 Therapeutic Exercise: Pulley flexion 5 sec x 10  3-way doorway pec stretch 3x30 Corner stretch 3 positions 1x30 UT & LS stretches 2x30 (bil) Neuromuscular re-ed: Shoulder isometrics (Rt) --> flexion, abd, ER 10x5 each Wall slides flexion & scaption hand on washcloth x 10 each Therapeutic Activity: Seated cervical retraction 10x5 Lateral cervical flexion 5 sec x 5 R/L  Backward shoulder rolls Reciprocal backward shoulder rolls  Scapula retraction 10x10 with noodle  Standing with back against noodle Shoulder ER & scap retraction 10x5 W arms open/close 10x10 Self care: Sleep with tennis ball at front of shoulder so she will not roll onto R shoulder at night   [facility] Adult PT Treatment:                                                DATE: 09/15/2024 Therapeutic Exercise: 3-way doorway pec stretch 3x30 UT & LS stretches 2x30  (bil) Neuromuscular re-ed: Shoulder isometrics (Rt) --> flexion, abd, ER 10x5 each Wall slides flexion & scaption x 5 each Therapeutic Activity: Seated cervical retraction 10x5 Scapula retraction 10x10 Standing with back against noodle Shoulder ER & scap retraction 10x5 W arms open/close 10x10  PATIENT EDUCATION: Education details: Edited HEP for better compliance  Person educated: Patient Education method: Programmer, multimedia, Demonstration, Actor cues, Verbal cues, and Handouts Education comprehension: verbalized understanding, returned demonstration, verbal cues required, tactile cues required, and needs further education  HOME EXERCISE PROGRAM: Access Code: [email or web address] URL: [email or web address] Date: 09/28/2024 Prepared by: Celyn Holt  Exercises - Seated Cervical Retraction  - 2 x daily - 7 x weekly - 1-2 sets - 5-10 reps - 10 sec  hold - Seated Passive Cervical Retraction  - 2 x daily - 7 x weekly - 1 sets - 5-10 reps - 5-10 sec  hold - Seated Scapular Retraction  - 2 x daily - 7 x weekly - 1-2 sets - 10 reps - 10 sec  hold - Shoulder External Rotation and Scapular Retraction  - 1-2 x daily - 7 x weekly - 1 sets - 10 reps - 3-5 sec   hold - Standing Shoulder W at Wall  - 1-2 x daily - 7 x weekly - 1 sets - 10 reps - 3 sec  hold - Doorway Pec Stretch at 60 Degrees Abduction  - 3 x daily - 7 x weekly - 1 sets - 3 reps - Doorway Pec Stretch at 90 Degrees Abduction  - 3 x daily - 7 x weekly - 1 sets - 3 reps - 30 seconds  hold - Doorway Pec Stretch at 120 Degrees Abduction  - 3 x daily - 7 x weekly - 1 sets - 3 reps - 30 second hold  hold - Seated Cervical Sidebending AROM  - 2 x daily - 7 x weekly - 1 sets - 3-5 reps - 5-10 sec  hold - Standing Backward Shoulder Rolls  - 2 x daily - 7 x weekly -  1 sets - 10 reps - 1-2 sec  hold  ASSESSMENT:  CLINICAL IMPRESSION: Minimal pain/discomfort reported during Rt shoulder measurements in all directions; equal range measured  between Rt and Lt shoulders. Mild discomfort in Rt shoulder during flexion wall slides with light resistance, however no pain. Postural strengthening progressed as tolerated, providing occasional verbal cues to improve upright posture. Encouraged patient to do more strengthening exercises form HEP.   EVAL: Patient is a 48 y.o. female who was seen today for physical therapy evaluation and treatment for chronic R shoulder pain; rotator cuff strain; shoulder impingement syndrome. [name] has a history of chronic shoulder pain over the past 5-10 years. She received an injection for R shoulder 4/25 and again 08/23/24. Signs and symptoms are consistent with impingement R shoulder. Patient presents with poor posture and alignment; postural weakness; limited and painful R shoulder ROM; muscular tightness and pain with palpation in R shoulder girdle; limited functional activities and pain on a daily basis. Patient will benefit from PT to address problems identified.   OBJECTIVE IMPAIRMENTS: decreased activity tolerance, decreased ROM, decreased strength, impaired UE functional use, improper body mechanics, postural dysfunction, and pain.    GOALS: Goals reviewed with patient? Yes  SHORT TERM GOALS: Target date: 10/05/2024  Independent in initial HEP  Baseline: Goal status: MET  2.  Increase AROM R shoulder to equal AROM L shoulder  Baseline:  10/03/24: see above Goal status: MET  3.  Patient demonstrates proper sitting and standing posture to improve scapular and shoulder position  Baseline:  Goal status: NOT MET   LONG TERM GOALS: Target date: 11/02/2024  Decrease pain R shoulder by 50% allowing patient to increase functional activity level  Baseline:  Goal status: INITIAL  2.  5/5 strength posterior shoulder girdle musculature with patient to demonstrate improve postural control to improve GH mechanics and decrease impingement  Baseline:  Goal status: INITIAL  3.  Improve Quick DASH score by  10 points  Baseline: 40.9/100  Goal status: INITIAL  4.  Independent in advanced HEP  Baseline:  Goal status: INITIAL   PLAN:  PT FREQUENCY: 1-2x/week  PT DURATION: 8 weeks  PLANNED INTERVENTIONS: 97164- PT Re-evaluation, 97110-Therapeutic exercises, 97530- Therapeutic activity, 97112- Neuromuscular re-education, 97535- Self Care, [id number]- Manual therapy, Patient/Family education, Taping, and Joint mobilization  PLAN FOR NEXT SESSION: continue with postural correction and education; review and progress exercises; manual work and modalities as indicated  Progress with TB strengthening as tolerated    Lamarr GORMAN Price, PTA 10/03/2024, 3:28 PM

## 2024-10-04 ENCOUNTER — Ambulatory Visit (INDEPENDENT_AMBULATORY_CARE_PROVIDER_SITE_OTHER): Payer: MEDICAID | Admitting: Professional

## 2024-10-04 ENCOUNTER — Ambulatory Visit: Payer: MEDICAID

## 2024-10-04 ENCOUNTER — Encounter: Payer: Self-pay | Admitting: Professional

## 2024-10-04 DIAGNOSIS — F411 Generalized anxiety disorder: Secondary | ICD-10-CM

## 2024-10-04 DIAGNOSIS — F319 Bipolar disorder, unspecified: Secondary | ICD-10-CM

## 2024-10-04 DIAGNOSIS — F603 Borderline personality disorder: Secondary | ICD-10-CM | POA: Diagnosis not present

## 2024-10-04 DIAGNOSIS — F900 Attention-deficit hyperactivity disorder, predominantly inattentive type: Secondary | ICD-10-CM

## 2024-10-04 NOTE — Progress Notes (Unsigned)
 [facility] Counselor/Therapist Progress Note  Patient ID: HAE AHLERS, MRN: [id number],    Date: 10/04/2024  Time Spent: 48 minutes [id number]   Treatment Type: Individual Therapy  Risk Assessment: Danger to Self:  No Self-injurious Behavior: No Danger to Others: No  Subjective: This session was held via video teletherapy. The patient consented to video teletherapy and was located at her home room during this session. She is aware it is the responsibility of the patient to secure confidentiality on her end of the session. The provider was in a private home office for the duration of this session.   The patient arrived early for her online session.  Issues addressed: 1-pt wrote a letter -she wrote about her surrounding herself with animals -animals don't leave me -she said a lot of it is about stuff she has already written about -she admits it seems harder to write about it now -she basically hates -pt doesn't know why he left but it was when she was a baby -she doesn't know much about him leaving and she never really asked her mom or talked to him about it -she feels as though he left and started a new family -pt did not have those feelings until her adult life -she ceased contact at 76 in her first year in college -second semester no roommate and crying all the time -she describes her childhood was okay -she has felt that any people that left me I was being abandoned -she could not talk to men she did not know until 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 in 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 -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 for at risk kids and she did not realize she was at risk 2-difficulty with relationships is historical a-pt thinks there is unfinished business with her Father -I don't know how to get over it to move on b-she has lost three friendships that she regrets c-unmet expectations -potential communication barrier 3-what would you want if you  could wave a wand -financially secure -be married with someone that she care about and that cares about me -would live in the same area in a house with a small horse farm and have hire people -she would want more cats and a dog -she wants a dog so badly but can't and cannot afford -step by step development of relationship -research what activities she could become involved  Treatment Plan Problems: Depression, Financial Stress, Grief /Loss Unresolved, Medical Issues Symptoms: A diagnosis of a chronic illness that is not life-threatening but necessitates changes in living. A long-term lack of discipline in money management that has led to excessive indebtedness. A feeling of low self-esteem and hopelessness that is associated with the lack of sufficient income to cover the cost of living. Loss of income due to unemployment. Strong emotional response of sadness exhibited when losses are discussed. Serial losses in life (i.e., deaths, divorces, jobs) that led to depression and discouragement. Sad affect, social withdrawal, anxiety, loss of interest in activities, and low energy. History of chronic or recurrent depression for which the client has taken antidepressant medication, been hospitalized, had outpatient treatment, or had a course of electroconvulsive therapy. Poor concentration and indecisiveness. Sleeplessness or hypersomnia. Low self-esteem. Lack of energy. Diminished interest in or enjoyment of activities. Depressed or irritable mood. Avoidance of talking on anything more than a superficial level about the loss. Unresolved grief issues. Lack of appetite, weight loss, and/or insomnia as well as other depression signs that occurred since the loss.  Decrease or loss of appetite. Goals: Accept emotional support from those who care, without pushing them away in anger. Achieve an inner strength to control personal impulses, cravings, and desires that directly or indirectly increase  debt irresponsibly. Begin a healthy grieving process around the loss. Develop an awareness of how the avoidance of grieving has affected life and begin the healing process. Gain a new sense of self-worth in which the substance of one's value is not attached to the capacity to do things or own things that cost money. Live life to the fullest extent possible, even though remaining time may be limited. Recognize, accept, and cope with feelings of depression. Resolve financial crisis with a path to eliminate debt. Work through the grieving process and face with peace the reality of own death. Objectives target date for all objectives is 09/08/2025: Identify the losses or limitations that have been experienced due to the medical condition. Identify feelings associated with the medical condition. Work with therapist to develop a plan for coping with stress. Learn and implement skills for managing stress. Implement mindfulness techniques for relapse prevention. Identify and replace thoughts and beliefs that support depression. Learn and implement behavioral strategies to overcome depression. Identify and voice positives about the deceased loved one including previous positive experiences, positive characteristics, positive aspects of the relationship, and how these things may be remembered. Set financial goals and make budgetary decisions with partner, allowing for equal input and balanced control over financial matters. Write a budget that balances income with expenses. Identify priorities that should control how money is spent. Begin verbalizing feelings associated with the loss. Identify how avoiding dealing with loss has negatively impacted life. Identify what stages of grief have been experienced in the continuum of the grieving process. Describe history, symptoms, and treatment of the medical condition. Interventions: Review the client's budget as to reasonableness and completeness. Reinforce  changes in managing money that reflect compromise, responsible planning, and respectful cooperation with the client's partner. Ask the client to list the priorities that he/she believes should give direction to how his/her money is spent; process those priorities. Review the client's spending history to discover what priorities and values have misdirected spending. Ask the client to list ways that avoidance of grieving has negatively impacted his/her life. Ask the client to discuss and/or list the positive aspects of and memories about his/her relationship with the lost loved one; reinforce the client's expression of positive memories and emotions (e.g., smiling, laughing); encourage the client to share these thoughts with supportive loved ones. Educate the client on the stages of the grieving process and answer any questions he/she may have. Assist the client in identifying the stages of grief that he/she has experienced and which stage he/she is presently working through. Ask the client to bring pictures or mementos connected with his/her loss to a session and talk about them (or assign Creating a Patent examiner in the Adult Psychotherapy Administrator, arts by Jenniffer). Assist the client in identifying and expressing feelings connected with his/her loss. In a collaborative fashion, develop a therapeutic alliance while gathering a history of the condition, including symptoms, client's reactions to the diagnosis, treatments of the condition, and prognosis. Assist the client in developing a tailored coping action plan for preventing and/or managing identified stressful reactions using skills such as relaxation, exercise, cognitive reframing, and problem-solving. Conduct skills training, building upon effective coping strategies the client possesses, and teaching new skills tailored to the specific stressor. Assist the client in identifying, sorting through, and verbalizing  the various feelings generated  by his/her medical condition. Ask the client to list the changes, losses, or limitations that have resulted from the medical condition (or assign The Impact of My Illness in the Adult Psychotherapy Homework Planner by Jenniffer). Assign the client to self-monitor thoughts, feelings, and actions in daily journal (e.g., Negative Thoughts Trigger Negative Feelings in the Adult Psychotherapy Homework Planner by Jenniffer; Daily Record of Dysfunctional Thoughts in Cognitive Therapy of Depression by Almarie Candida Gentry and Shona); process the journal material to challenge depressive thinking patterns and replace them with reality-based thoughts. Assign behavioral experiments in which depressive automatic thoughts are treated as hypotheses/prediction, reality-based alternative hypotheses/prediction are generated, and both are tested against the client's past, present, and/or future experiences. Assist the client in developing skills that increase the likelihood of deriving pleasure from behavioral activation (e.g., assertiveness skills, developing an exercise plan, less internal/more external focus, increased social involvement); reinforce success. Use mindfulness meditation and cognitive therapy techniques to help the client learn to recognize and regulate the negative thought processes associated with depression and to change his/her relationship with these thoughts (see Mindfulness-Based Cognitive Therapy for Depression by Kriste Pouch, and Jil). Work to increase the client's new sense of well-being by building his/her personal strengths evident in their progress through therapy (or assign Acknowledging My Strengths and/or What Are My Good Qualities? in the Adult Psychotherapy Homework Planner by Jenniffer).  Diagnosis:Bipolar I disorder (HCC)  Generalized anxiety disorder  Attention deficit hyperactivity disorder (ADHD), predominantly inattentive type  Borderline personality disorder  [facility])  Plan:  -write a letter -meet again on Friday, October 21, 2024 at 10am online in person

## 2024-10-07 ENCOUNTER — Ambulatory Visit: Payer: Self-pay | Admitting: Physician Assistant

## 2024-10-07 ENCOUNTER — Other Ambulatory Visit: Payer: Self-pay | Admitting: Physician Assistant

## 2024-10-07 DIAGNOSIS — M47816 Spondylosis without myelopathy or radiculopathy, lumbar region: Secondary | ICD-10-CM

## 2024-10-07 DIAGNOSIS — M51362 Other intervertebral disc degeneration, lumbar region with discogenic back pain and lower extremity pain: Secondary | ICD-10-CM

## 2024-10-07 DIAGNOSIS — M0609 Rheumatoid arthritis without rheumatoid factor, multiple sites: Secondary | ICD-10-CM

## 2024-10-07 DIAGNOSIS — M545 Low back pain, unspecified: Secondary | ICD-10-CM

## 2024-10-07 DIAGNOSIS — G894 Chronic pain syndrome: Secondary | ICD-10-CM

## 2024-10-07 DIAGNOSIS — G8929 Other chronic pain: Secondary | ICD-10-CM

## 2024-10-07 MED ORDER — HYDROCODONE-ACETAMINOPHEN 5-325 MG PO TABS: ORAL_TABLET | ORAL | 0 refills | Status: DC

## 2024-10-07 NOTE — Telephone Encounter (Signed)
 Copied from CRM [id number]. Topic: Clinical - Medication Refill >> Oct 07, 2024  9:32 AM Delon DASEN wrote: Medication: HYDROcodone -acetaminophen  (NORCO/VICODIN) 5-325 MG tablet  Has the patient contacted their pharmacy? No (Agent: If no, request that the patient contact the pharmacy for the refill. If patient does not wish to contact the pharmacy document the reason why and proceed with request.) (Agent: If yes, when and what did the pharmacy advise?)  This is the patient's preferred pharmacy:  [facility], [place] - [address] [place] [address] Phone: [phone] Fax: [phone]   Is this the correct pharmacy for this prescription? Yes If no, delete pharmacy and type the correct one.   Has the prescription been filled recently? Yes  Is the patient out of the medication? No  Has the patient been seen for an appointment in the last year OR does the patient have an upcoming appointment? Yes  Can we respond through MyChart? Yes  Agent: Please be advised that Rx refills may take up to 3 business days. We ask that you follow-up with your pharmacy.

## 2024-10-07 NOTE — Telephone Encounter (Signed)
 Copied from CRM #[id number]. Topic: Clinical - Prescription Issue >> Oct 07, 2024  2:17 PM Willma R wrote: Reason for CRM: Shanei from the pharmacy calling to clarify information for patients HYDROcodone -acetaminophen  (NORCO/VICODIN) 5-325 MG tablet. States the prescription is missing a day supply and the dosage says take 1 - 1/2 and needs to clarify it that is supposed to be 1 - 1 1/2.  Mercie can be reached at [phone]

## 2024-10-10 ENCOUNTER — Ambulatory Visit: Payer: MEDICAID

## 2024-10-10 VITALS — BP 98/68 | Ht 67.0 in | Wt 173.0 lb

## 2024-10-10 DIAGNOSIS — M48061 Spinal stenosis, lumbar region without neurogenic claudication: Secondary | ICD-10-CM

## 2024-10-10 DIAGNOSIS — M47816 Spondylosis without myelopathy or radiculopathy, lumbar region: Secondary | ICD-10-CM

## 2024-10-10 NOTE — Progress Notes (Addendum)
   Subjective:    Patient ID: Frances [name], female    DOB: 48 y.o., [birth year]   MRN: [id number]  Chief Complaint: low back pain with right sided sciatica History of Present Illness  Patient presenting for follow-up on known lumbar spondylosis with moderate to severe central canal stenosis and neuroforaminal stenosis.   Reports her symptoms are unchanged from last visit with myself and desires repeat epidural spinal injection. She received epidural spinal steroid injection last on 08/04/2024 and received temporal benefit from this.  Has home exercises at home but has not been remembering to do them regularly Back brace use has not prevented the advance of radicular symptoms down right leg Once again going down her right leg to her posterior knee No numbness or tingling No fevers, chills, unexpected weight loss, B/B incontinence, saddle anesthesia.  05/07/2024 lumbar spine MRI: 1. Worsened spondylosis at L4-5 where there is now moderately severe to severe central canal stenosis and narrowing in both subarticular recesses with impingement on the descending L5 roots. Mild to moderate bilateral foraminal narrowing is also present at L4-5. 2. No change in moderate central canal stenosis at L3-4. 3. Horseshoe kidney.  Objective:   Vitals:   10/10/24 1447  BP: 98/68    Lumbar Spine -Inspection: no swelling or skin changes -Palpation: TTP + midline at the level of L4, + paraspinals (mostly on the right) -AROM/PROM: FROM in all planes of the low back -Strength: full hip flexion (L1/L2), knee extension (L3/4), ankle dorsiflexion (L4/5), hip extension (L5/S1), knee flexion (L5/S1/S2) plantarflexion (S1/2). -Sensation: intact sensation over the medial femoral condyle (L3), patella (L4), lateral femoral condyle (L5), lateral malleolus (S1). -Reflexes: normal patellar (L3/4), hamstring (L5/S1), achilles (S1/2) reflexes, equal bilaterally -Special tests: - Straight Leg Raise, + Stork, -  Slump test      Assessment & Plan:   Assessment & Plan  [name] is a very pleasant 48 year old female with well-documented lumbar spondylosis, moderate to severe central canal stenosis, and neuroforaminal stenosis presenting with recurrence of pain and radiculopathy on the right side.  She had a partial response to epidural steroid injections in the past and today is desiring to begin receiving a scheduled series of these.  I believe this is a reasonable next step and have encouraged her to increase the intensity of her home exercise program which she has thus far not done as assiduously as I would hope, as this is likely to improve her long-term outcome.  If limited response to injection, plan to refer to spine surgery.

## 2024-10-11 ENCOUNTER — Other Ambulatory Visit: Payer: Self-pay

## 2024-10-11 DIAGNOSIS — M47816 Spondylosis without myelopathy or radiculopathy, lumbar region: Secondary | ICD-10-CM

## 2024-10-11 DIAGNOSIS — M48061 Spinal stenosis, lumbar region without neurogenic claudication: Secondary | ICD-10-CM

## 2024-10-12 ENCOUNTER — Telehealth: Payer: Self-pay | Admitting: Physician Assistant

## 2024-10-12 NOTE — Telephone Encounter (Signed)
 Copied from CRM #[id number]. Topic: Clinical - Prescription Issue >> Oct 12, 2024  3:11 PM Jasmin G wrote: Reason for CRM: Pt was informed by her preferred pharmacy, [id number] - [address] [place] - [address] that her HYDROcodone -acetaminophen  (NORCO/VICODIN) 5-325 MG tablet prescription refill was not refilled correctly as she states that dosage instructions written say to take half to 1 tablet twice per day and it should be 1 and a half tablet twice a day. Call pt back if needed at [phone].

## 2024-10-13 ENCOUNTER — Telehealth: Payer: Self-pay

## 2024-10-13 ENCOUNTER — Ambulatory Visit: Payer: MEDICAID

## 2024-10-13 DIAGNOSIS — G8929 Other chronic pain: Secondary | ICD-10-CM

## 2024-10-13 DIAGNOSIS — G894 Chronic pain syndrome: Secondary | ICD-10-CM

## 2024-10-13 DIAGNOSIS — R29898 Other symptoms and signs involving the musculoskeletal system: Secondary | ICD-10-CM

## 2024-10-13 DIAGNOSIS — R293 Abnormal posture: Secondary | ICD-10-CM

## 2024-10-13 DIAGNOSIS — M25511 Pain in right shoulder: Secondary | ICD-10-CM | POA: Diagnosis not present

## 2024-10-13 DIAGNOSIS — M6281 Muscle weakness (generalized): Secondary | ICD-10-CM

## 2024-10-13 DIAGNOSIS — M51362 Other intervertebral disc degeneration, lumbar region with discogenic back pain and lower extremity pain: Secondary | ICD-10-CM

## 2024-10-13 DIAGNOSIS — M545 Low back pain, unspecified: Secondary | ICD-10-CM

## 2024-10-13 DIAGNOSIS — M0609 Rheumatoid arthritis without rheumatoid factor, multiple sites: Secondary | ICD-10-CM

## 2024-10-13 DIAGNOSIS — M47816 Spondylosis without myelopathy or radiculopathy, lumbar region: Secondary | ICD-10-CM

## 2024-10-13 NOTE — Telephone Encounter (Signed)
 This task has been completed as requested. Please review other TE for additional information. No further action needed.

## 2024-10-13 NOTE — Telephone Encounter (Signed)
 Contacted the [employer], spoke with [place]. Clarification for the pain medication was provided to the CPhT verbatim. The sig/directions will be applied all the rxs. The pharmacy is requesting for the provider to update the rx in the system to reflect the current sig/directions prior to the next refill request.

## 2024-10-13 NOTE — Therapy (Addendum)
 OUTPATIENT PHYSICAL THERAPY SHOULDER TREATMENT PHYSICAL THERAPY DISCHARGE SUMMARY  Visits from Start of Care: 5  Current functional level related to goals / functional outcomes: See progress note for discharge status    Remaining deficits: Continued pain with certain activities    Education / Equipment: HEP    Patient agrees to discharge. Patient goals were partially met. Patient is being discharged due to patient in independent in HEP but has historically not been consistent with her HEP. She was encouraged to work on exercises and activities on a daily basis to maximize benefit from treatment. Provided suggestions on fitting exercises into daily activities.   Frances [name] PT, MPH 10/13/24 12:43 PM    Patient Name: Frances [name] MRN: [id number] DOB:May 25, 1976, 48 y.o., female Today's Date: 10/13/2024  END OF SESSION:  PT End of Session - 10/13/24 1147     Visit Number 5    Number of Visits 16    Date for Recertification  11/02/24    Authorization Type [employer]    Authorization Time Period 16 UNITS APPROVED FOR PT 09/15/2024-11/02/2024    Authorization - Visit Number 4    Authorization - Number of Visits 16    PT Start Time 1148    PT Stop Time 1218   pt discharged   PT Time Calculation (min) 30 min    Activity Tolerance Patient tolerated treatment well    Behavior During Therapy WFL for tasks assessed/performed          Past Medical History:  Diagnosis Date   Anxiety    Arthritis    Back pain, chronic    Bipolar 1 disorder (HCC)    Chronic fatigue syndrome    DDD (degenerative disc disease), lumbar    Depression    Diabetes mellitus without complication (HCC)    Fibromyalgia    IUD 2012   Mirena   MTHFR gene mutation    Rheumatoid arteritis (HCC)    Past Surgical History:  Procedure Laterality Date   BREAST REDUCTION SURGERY Bilateral 01/07/2024   Procedure: Bilateral breast reduction with liposuction;  Surgeon: Lowery Estefana RAMAN,  DO;  Location: [facility];  Service: Plastics;  Laterality: Bilateral;   COLONOSCOPY WITH PROPOFOL  N/A 02/25/2017   Procedure: COLONOSCOPY WITH PROPOFOL ;  Surgeon: Elsie Cree, MD;  Location: [facility];  Service: Endoscopy;  Laterality: N/A;   left knee lateral release  1993   scar tisue removal  03/2005   left ankle   TONSILLECTOMY  age 29's   Patient Active Problem List   Diagnosis Date Noted   Paresthesia of both feet 09/21/2024   Pilar cyst of scalp 09/09/2024   Dizziness 09/09/2024   Degeneration of intervertebral disc of lumbar region with discogenic back pain and lower extremity pain 06/08/2024   Urinary retention 06/07/2024   Carpal tunnel syndrome, bilateral 02/25/2024   Pain in left third proximal interphalangeal joint 10/16/2023   Personal history UTI 09/14/2023   Rheumatoid arthritis 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 (HCC) 09/14/2023   Urinary hesitancy 06/30/2023   Urinary frequency 06/16/2023   OAB (overactive bladder) 06/16/2023   Chronic pain syndrome 06/10/2023   Crush injury of right foot 05/25/2023   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   Microalbuminuria 09/09/2022   Night sweats 06/18/2022   Proteinuria 03/17/2022   Chronic right hip pain 01/06/2022   Impingement syndrome, shoulder, right 01/06/2022   ETD (Eustachian tube dysfunction), bilateral 08/23/2021   SOB (shortness of breath) on exertion 08/20/2021  Overweight (BMI 25.0-29.9) 06/18/2021   Attention deficit hyperactivity disorder (ADHD), 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   Hyperlipidemia LDL goal <70 03/18/2021   Diabetes mellitus (HCC) 03/13/2021   Seasonal allergies 12/25/2020   Symptomatic mammary hypertrophy 09/04/2020   Recurrent major depressive disorder, in partial remission 05/24/2020   Generalized anxiety disorder 05/24/2020   DDD (degenerative disc disease), cervical 03/13/2020   Ingrown right big toenail 03/13/2020   Labral tear of right hip  joint, degenerative 01/11/2020   Grief reaction 11/28/2019   Borderline personality disorder (HCC) 01/22/2018   Seronegative rheumatoid arthritis (HCC) 12/09/2017   New daily persistent headache 09/26/2017   MTHFR gene mutation 09/20/2017   Bipolar I disorder (HCC) 09/16/2017   Chronic fatigue syndrome 09/16/2017   Back pain 10/13/2008   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   Lumbar spondylosis 02/10/2008   FIBROMYALGIA 02/10/2008   Other specified abnormal findings of blood chemistry 02/10/2008    PCP: Vermell LITTIE Bologna, PA-C  REFERRING PROVIDER: Dr Redell DELENA Robes  REFERRING DIAG: R RC strain; shoulder impingement; chronic R shoulder pain  THERAPY DIAG:  Chronic right shoulder pain  Muscle weakness (generalized)  Abnormal posture  Other symptoms and signs involving the musculoskeletal system  Rationale for Evaluation and Treatment: Rehabilitation  ONSET DATE: 03/22/24  SUBJECTIVE:                                                                                                                                                                                      SUBJECTIVE STATEMENT: Patient reports she has no pain in front of shoulder but continues to have mild pain along lateral shoulder/upper arm. Patient states she rode horses last week and her shoulder felt fine, no pain with saddle or reaching across the body but has discomfort when reaching out to the side.   EVAL: Patient reports that she has had R shoulder pain for 5-10 years with increased symptoms this year. Received injection 4/25 and again 08/23/24 with some improvement but shoulder is still hurting. She does not know of any injury or irritation. Likes to sleep with arms overhead and that irritates shoulder. Has wrist braces that she is supposed to wear when she is sleeping but she has not been using them.    Hand dominance: Right  PERTINENT HISTORY: Anxiety; arthritis; chronic back pain; bipolar disorder; CFS; DDD:  depression; DM; fibromyalgia; rheumatoid arteritis; fibromyalgia Chronic fatigue syndrome; Labral tear of Rt hip   PAIN:  Are you having pain? Yes: NPRS scale: 5/10  Pain location: anterior R shoulder  Pain description: dull; aching  Aggravating factors: reaching behind the body; sleeping with arms overhead Relieving factors: injection   PRECAUTIONS: None  WEIGHT BEARING RESTRICTIONS: No  FALLS:  Has patient fallen in last 6 months? No  LIVING ENVIRONMENT: Lives with: lives with their friend  Lives in: House/apartment Stairs: Yes: Internal: 1 steps; none and External: 3 steps; none Has following equipment at home: None  OCCUPATION: Not working; active with horse riding and care; laundry; household chores irregularly   PATIENT GOALS:decrease shoulder pain and learn some exercises for shoulder   NEXT MD VISIT: 10/20/24  OBJECTIVE:  Note: Objective measures were completed at Evaluation unless otherwise noted.  DIAGNOSTIC FINDINGS:  4/24 MRI R shoulder - Moderate distal supraspinatus and mild distal infraspinatus tendinosis with bursal sided fraying. No high-grade or retracted cuff tear. No significant muscle atrophy.   Mild subacromial-subdeltoid bursitis.   Moderate intra-articular long head biceps tendinosis.   Mild glenohumeral and AC joint osteoarthritis.  PATIENT SURVEYS:  Quick DASH - 40.9/100; 40.9%      SENSATION: Sometimes awakens with tingling thumbs bilat   POSTURE: Patient presents with head forward posture with increased thoracic kyphosis; shoulders rounded and elevated; scapulae abducted and rotated along the thoracic spine; head of the humerus anterior in orientation.   UPPER EXTREMITY ROM:   Active ROM Right eval Left eval Right 10/03/24 Left 10/03/24  Shoulder flexion 136 pain 140 152 153  Shoulder extension 55 pain pop 61 42 43  Shoulder abduction 151 pain 160 155 155  Shoulder adduction      Shoulder internal rotation T7 pop T 7 T7  T7  Shoulder external rotation 65 pain 90 68 65  Elbow flexion      Elbow extension      Wrist flexion      Wrist extension      Wrist ulnar deviation      Wrist radial deviation      Wrist pronation      Wrist supination      (Blank rows = not tested)  UPPER EXTREMITY MMT:  MMT Right eval Left eval Right 10/13/24 Left 10/13/24  Shoulder flexion 5 pain  5 5 5   Shoulder extension 5 5 5 5   Shoulder abduction 5 pain  5 5 5   Shoulder adduction      Shoulder internal rotation 5 pain  5 5 5   Shoulder external rotation 5 pain  5 5 5   Middle trapezius 4 4    Lower trapezius 4 4    Elbow flexion      Elbow extension      Wrist flexion      Wrist extension      Wrist ulnar deviation      Wrist radial deviation      Wrist pronation      Wrist supination      Grip strength (lbs)      (Blank rows = not tested)   PALPATION:  Muscular tightness R > L ant/lat/posterior cervical musculature; pecs; upper trap; leveator; R anterior shoulder/biceps tendon area  [facility] Adult PT Treatment:                                                DATE: 10/13/2024 Therapeutic Exercise: UBE L3 x 2 min fwd/ 2 min bkwd 3-way doorway pec stretch x 30 sec each --> low, mid, high Shoulder MMT (see above) Therapeutic Activity: Wall clock + yellow TB (bil) Shoulder horizontal abduction with scap retraction + red TB --> palms down x10, palms up x10 W arms + red TB x10 Scaption & abduction arm raises to 90 degrees + yellow TB --> 2 x 10 each Quick DASH    [facility] Adult PT Treatment:                                                DATE: 10/03/2024 Therapeutic Exercise: Shoulder ROM measurements (see above) UBE L2 x 2 min fwd, 2 min bkwd Doorway pec stretch 2 x 30 sec Corner pec stretch 3 x 30 sec Neuromuscular re-ed: Wall clock + yellow TB (bil) Standing with back against  noodle: Scapula retraction 5x10 sec Shoulder ER & scap retraction 10x5 sec W arms open/close 10x10 sec W arms + red TB 10x3 sec Therapeutic Activity: Shoulder flexion wall slides + 1#DB SA wall slides x 10  Lift off in overhead V arms at wall 10 x 3 sec   [facility] Adult PT Treatment:                                                DATE: 09/28/2024 Therapeutic Exercise: Pulley flexion 5 sec x 10  3-way doorway pec stretch 3x30 Corner stretch 3 positions 1x30 UT & LS stretches 2x30 (bil) Neuromuscular re-ed: Shoulder isometrics (Rt) --> flexion, abd, ER 10x5 each Wall slides flexion & scaption hand on washcloth x 10 each Therapeutic Activity: Seated cervical retraction 10x5 Lateral cervical flexion 5 sec x 5 R/L  Backward shoulder rolls Reciprocal backward shoulder rolls  Scapula retraction 10x10 with noodle  Standing with back against noodle Shoulder ER & scap retraction 10x5 W arms open/close 10x10 Self care: Sleep with tennis ball at front of shoulder so she will not roll onto R shoulder at night    PATIENT EDUCATION: Education details: Edited HEP for better compliance  Person educated: Patient Education method: Programmer, multimedia, Demonstration, Tactile cues, Verbal cues, and Handouts Education comprehension: verbalized understanding, returned demonstration, verbal cues required, tactile cues required, and needs further education  HOME EXERCISE PROGRAM: Access Code: [email or web address] URL: [email or web address] Date: 09/28/2024 Prepared by: Frances Holt  Exercises - Seated Cervical Retraction  - 2 x daily - 7 x weekly - 1-2 sets - 5-10 reps - 10 sec  hold - Seated Passive Cervical Retraction  - 2 x daily - 7 x weekly - 1 sets - 5-10 reps - 5-10 sec  hold - Seated Scapular Retraction  - 2 x daily - 7 x weekly - 1-2 sets - 10 reps - 10 sec  hold - Shoulder External Rotation and Scapular Retraction  - 1-2 x daily - 7 x weekly - 1 sets - 10 reps - 3-5 sec  hold -  Standing Shoulder W at Wall  - 1-2 x daily - 7 x weekly - 1 sets - 10 reps - 3 sec  hold - Doorway Pec Stretch at 60 Degrees Abduction  - 3 x daily - 7 x weekly - 1 sets - 3 reps - Doorway Pec Stretch at 90 Degrees Abduction  - 3 x daily - 7 x weekly - 1 sets - 3 reps - 30 seconds  hold - Doorway Pec Stretch at 120 Degrees Abduction  - 3 x daily - 7 x weekly - 1 sets - 3 reps - 30 second hold  hold - Seated Cervical Sidebending AROM  - 2 x daily - 7 x weekly - 1 sets - 3-5 reps - 5-10 sec  hold - Standing Backward Shoulder Rolls  - 2 x daily - 7 x weekly - 1 sets - 10 reps - 1-2 sec  hold  ASSESSMENT:  CLINICAL IMPRESSION: Patient has met all goals within POC; significant improvement in overall shoulder pain and shoulder flexion AROM. Progressed shoulder strengthening exercises with no exacerbation of pain/discomfort. Encouraged patient to incorporate HEP to independently continue shoulder/postural strengthening. Patient is agreeable to discharge from PT today.   EVAL: Patient is a 48 y.o. female who was seen today for physical therapy evaluation and treatment for chronic R shoulder pain; rotator cuff strain; shoulder impingement syndrome. Frances [name] has a history of chronic shoulder pain over the past 5-10 years. She received an injection for R shoulder 4/25 and again 08/23/24. Signs and symptoms are consistent with impingement R shoulder. Patient presents with poor posture and alignment; postural weakness; limited and painful R shoulder ROM; muscular tightness and pain with palpation in R shoulder girdle; limited functional activities and pain on a daily basis. Patient will benefit from PT to address problems identified.   OBJECTIVE IMPAIRMENTS: decreased activity tolerance, decreased ROM, decreased strength, impaired UE functional use, improper body mechanics, postural dysfunction, and pain.    GOALS: Goals reviewed with patient? Yes  SHORT TERM GOALS: Target date: 10/05/2024  Independent in  initial HEP  Baseline: Goal status: MET  2.  Increase AROM R shoulder to equal AROM L shoulder  Baseline:  10/03/24: see above Goal status: MET  3.  Patient demonstrates proper sitting and standing posture to improve scapular and shoulder position  Baseline:  Goal status: NOT MET   LONG TERM GOALS: Target date: 11/02/2024  Decrease pain R shoulder by 50% allowing patient to increase functional activity level  Baseline:  10/13/24: 75% improvement Goal status: MET  2.  5/5 strength posterior shoulder girdle musculature with patient to demonstrate improve postural control to improve GH mechanics and decrease impingement  Baseline:  10/13/24: see above Goal status: MET  3.  Improve Quick DASH score by 10 points  Baseline: 40.9/100  10/13/24: 18.2/100 = 18.2% Goal status: MET  4.  Independent in advanced HEP  Baseline:  Goal status: MET   PLAN:  PT FREQUENCY: 1-2x/week  PT DURATION: 8 weeks  PLANNED INTERVENTIONS: 97164- PT Re-evaluation, 97110-Therapeutic exercises, 97530- Therapeutic activity, 97112- Neuromuscular re-education, 97535- Self Care, 02859- Manual therapy, Patient/Family education, Taping, and Joint mobilization  PLAN FOR NEXT SESSION: Discharge to independent HEP    Frances [name], PTA 10/13/2024, 12:21 PM   Frances [name] PT, MPH 10/13/24 12:43 PM

## 2024-10-13 NOTE — Telephone Encounter (Signed)
 Copied from CRM [id number]. Topic: Clinical - Medication Question >> Oct 13, 2024  2:36 PM Myrick T wrote: Reason for CRM: patient called again to get her script instructions corrected as she is out of the medication HYDROcodone -acetaminophen  (NORCO/VICODIN) 5-325 MG tablet. Patient stated the instructions should read...dosage instructions written say to take half to 1 tablet twice per day and it should be 1 and a half tablet twice a day. Please f/u with patient

## 2024-10-14 ENCOUNTER — Ambulatory Visit: Payer: MEDICAID | Admitting: Professional

## 2024-10-14 MED ORDER — HYDROCODONE-ACETAMINOPHEN 5-325 MG PO TABS: ORAL_TABLET | ORAL | 0 refills | Status: DC

## 2024-10-17 ENCOUNTER — Other Ambulatory Visit (HOSPITAL_COMMUNITY): Payer: Self-pay

## 2024-10-17 ENCOUNTER — Telehealth: Payer: Self-pay

## 2024-10-17 NOTE — Telephone Encounter (Signed)
 Pharmacy Patient Advocate Encounter   Received notification from CoverMyMeds that prior authorization for Hydrocodone /APAP 5/325mg  tabs is required/requested.   Insurance verification completed.   The patient is insured through [facility] MEDICAID.   Per test claim: PA required; PA submitted to above mentioned insurance via Latent Key/confirmation #/EOC [id number] Status is pending

## 2024-10-18 ENCOUNTER — Other Ambulatory Visit (HOSPITAL_COMMUNITY): Payer: Self-pay

## 2024-10-18 NOTE — Telephone Encounter (Signed)
 Pharmacy Patient Advocate Encounter  Received notification from [facility] MEDICAID that Prior Authorization for Hydrocodone /APAP 5/325mg  tabs has been APPROVED from 10/18/24 to 04/18/25   PA #/Case ID/Reference #: [id number]  Placed a call to [facility] to notify of the approval. Stated the patient had paid for the medication and picked it up and the cannot rebill after it has been picked up. Stated they will fill under the insurance next month.

## 2024-10-19 ENCOUNTER — Ambulatory Visit: Payer: MEDICAID

## 2024-10-19 ENCOUNTER — Ambulatory Visit (INDEPENDENT_AMBULATORY_CARE_PROVIDER_SITE_OTHER): Payer: MEDICAID | Admitting: Physician Assistant

## 2024-10-19 VITALS — BP 104/63 | HR 88 | Ht 67.0 in | Wt 173.0 lb

## 2024-10-19 DIAGNOSIS — I959 Hypotension, unspecified: Secondary | ICD-10-CM

## 2024-10-19 DIAGNOSIS — G8929 Other chronic pain: Secondary | ICD-10-CM | POA: Diagnosis not present

## 2024-10-19 DIAGNOSIS — M545 Low back pain, unspecified: Secondary | ICD-10-CM

## 2024-10-19 DIAGNOSIS — H539 Unspecified visual disturbance: Secondary | ICD-10-CM | POA: Diagnosis not present

## 2024-10-19 DIAGNOSIS — I951 Orthostatic hypotension: Secondary | ICD-10-CM

## 2024-10-19 DIAGNOSIS — A084 Viral intestinal infection, unspecified: Secondary | ICD-10-CM | POA: Diagnosis not present

## 2024-10-19 MED ORDER — KETOROLAC TROMETHAMINE 60 MG/2ML IM SOLN
60.0000 mg | Freq: Once | INTRAMUSCULAR | Status: AC
  Administered 2024-10-19: 60 mg via INTRAMUSCULAR

## 2024-10-19 NOTE — Patient Instructions (Addendum)
 Increase fluid intake to 2-2.5L Increase salt intake 6g/day Compression stockings Small frequent but LOW carb No Alcohol  Orthostatic Hypotension Blood pressure is a measurement of how strongly, or weakly, your circulating blood is pressing against the walls of your arteries. Orthostatic hypotension is a drop in blood pressure that can happen when you change positions, such as when you go from lying down to standing. Arteries are blood vessels that carry blood from your heart throughout your body. When blood pressure is too low, you may not get enough blood to your brain or to the rest of your organs. Orthostatic hypotension can cause light-headedness, sweating, rapid heartbeat, blurred vision, and fainting. These symptoms require further investigation into the cause. What are the causes? Orthostatic hypotension can be caused by many things, including: Sudden changes in posture, such as standing up quickly after you have been sitting or lying down. Loss of blood (anemia) or loss of body fluids (dehydration). Heart problems, neurologic problems, or hormone problems. Pregnancy. Aging. The risk for this condition increases as you get older. Severe infection (sepsis). Certain medicines, such as medicines for high blood pressure or medicines that make the body lose excess fluids (diuretics). What are the signs or symptoms? Symptoms of this condition may include: Weakness, light-headedness, or dizziness. Sweating. Blurred vision. Tiredness (fatigue). Rapid heartbeat. Fainting, in severe cases. How is this diagnosed? This condition is diagnosed based on: Your symptoms and medical history. Your blood pressure measurements. Your health care provider will check your blood pressure when you are: Lying down. Sitting. Standing. A blood pressure reading is recorded as two numbers, such as 120 over 80 (or 120/80). The first (top) number is called the systolic pressure. It is a measure of the  pressure in your arteries as your heart beats. The second (bottom) number is called the diastolic pressure. It is a measure of the pressure in your arteries when your heart relaxes between beats. Blood pressure is measured in a unit called mmHg. Healthy blood pressure for most adults is 120/80 mmHg. Orthostatic hypotension is defined as a 20 mmHg drop in systolic pressure or a 10 mmHg drop in diastolic pressure within 3 minutes of standing. Other information or tests that may be used to diagnose orthostatic hypotension include: Your other vital signs, such as your heart rate and temperature. Blood tests. An electrocardiogram (ECG) or echocardiogram. A Holter monitor. This is a device you wear that records your heart rhythm continuously, usually for 24-48 hours. Tilt table test. For this test, you will be safely secured to a table that moves you from a lying position to an upright position. Your heart rhythm and blood pressure will be monitored during the test. How is this treated? This condition may be treated by: Changing your diet. This may involve eating more salt (sodium) or drinking more water. Changing the dosage of certain medicines you are taking that might be lowering your blood pressure. Correcting the underlying reason for the orthostatic hypotension. Wearing compression stockings. Taking medicines to raise your blood pressure. Avoiding actions that trigger symptoms. Follow these instructions at home: Medicines Take over-the-counter and prescription medicines only as told by your health care provider. Follow instructions from your health care provider about changing the dosage of your current medicines, if this applies. Do not stop or adjust any of your medicines on your own. Eating and drinking  Drink enough fluid to keep your urine pale yellow. Eat extra salt only as directed. Do not add extra salt to your diet unless  advised by your health care provider. Eat frequent, small  meals. Avoid standing up suddenly after eating. General instructions  Get up slowly from lying down or sitting positions. This gives your blood pressure a chance to adjust. Avoid hot showers and excessive heat as directed by your health care provider. Engage in regular physical activity as directed by your health care provider. If you have compression stockings, wear them as told. Keep all follow-up visits. This is important. Contact a health care provider if: You have a fever for more than 2-3 days. You feel more thirsty than usual. You feel dizzy or weak. Get help right away if: You have chest pain. You have a fast or irregular heartbeat. You become sweaty or feel light-headed. You feel short of breath. You faint. You have any symptoms of a stroke. BE FAST is an easy way to remember the main warning signs of a stroke: B - Balance. Signs are dizziness, sudden trouble walking, or loss of balance. E - Eyes. Signs are trouble seeing or a sudden change in vision. F - Face. Signs are sudden weakness or numbness of the face, or the face or eyelid drooping on one side. A - Arms. Signs are weakness or numbness in an arm. This happens suddenly and usually on one side of the body. S - Speech. Signs are sudden trouble speaking, slurred speech, or trouble understanding what people say. T - Time. Time to call emergency services. Write down what time symptoms started. You have other signs of a stroke, such as: A sudden, severe headache with no known cause. Nausea or vomiting. Seizure. These symptoms may represent a serious problem that is an emergency. Do not wait to see if the symptoms will go away. Get medical help right away. Call your local emergency services (911 in the [place] Do not drive yourself to the hospital. Summary Orthostatic hypotension is a sudden drop in blood pressure. It can cause light-headedness, sweating, rapid heartbeat, blurred vision, and fainting. Orthostatic  hypotension can be diagnosed by having your blood pressure taken while lying down, sitting, and then standing. Treatment may involve changing your diet, wearing compression stockings, sitting up slowly, adjusting your medicines, or correcting the underlying reason for the orthostatic hypotension. Get help right away if you have chest pain, a fast or irregular heartbeat, or symptoms of a stroke. This information is not intended to replace advice given to you by your health care provider. Make sure you discuss any questions you have with your health care provider. Document Revised: 02/21/2021 Document Reviewed: 02/21/2021 Elsevier Patient Education  2024 [employer].

## 2024-10-20 ENCOUNTER — Other Ambulatory Visit: Payer: Self-pay

## 2024-10-20 ENCOUNTER — Ambulatory Visit (INDEPENDENT_AMBULATORY_CARE_PROVIDER_SITE_OTHER): Payer: MEDICAID

## 2024-10-20 VITALS — BP 94/62 | Ht 67.0 in | Wt 173.0 lb

## 2024-10-20 DIAGNOSIS — M7541 Impingement syndrome of right shoulder: Secondary | ICD-10-CM

## 2024-10-20 DIAGNOSIS — S46011A Strain of muscle(s) and tendon(s) of the rotator cuff of right shoulder, initial encounter: Secondary | ICD-10-CM

## 2024-10-20 DIAGNOSIS — G8929 Other chronic pain: Secondary | ICD-10-CM | POA: Diagnosis not present

## 2024-10-20 DIAGNOSIS — M25511 Pain in right shoulder: Secondary | ICD-10-CM

## 2024-10-20 LAB — CBC WITH DIFFERENTIAL/PLATELET
Basophils Absolute: 0 x10E3/uL (ref 0.0–0.2)
Basos: 1 %
EOS (ABSOLUTE): 0.1 x10E3/uL (ref 0.0–0.4)
Eos: 3 %
Hematocrit: 39 % (ref 34.0–46.6)
Hemoglobin: 12.7 g/dL (ref 11.1–15.9)
Immature Grans (Abs): 0 x10E3/uL (ref 0.0–0.1)
Immature Granulocytes: 0 %
Lymphocytes Absolute: 1.3 x10E3/uL (ref 0.7–3.1)
Lymphs: 30 %
MCH: 31.3 pg (ref 26.6–33.0)
MCHC: 32.6 g/dL (ref 31.5–35.7)
MCV: 96 fL (ref 79–97)
Monocytes Absolute: 0.2 x10E3/uL (ref 0.1–0.9)
Monocytes: 5 %
Neutrophils Absolute: 2.6 x10E3/uL (ref 1.4–7.0)
Neutrophils: 61 %
Platelets: 203 x10E3/uL (ref 150–450)
RBC: 4.06 x10E6/uL (ref 3.77–5.28)
RDW: 13.3 % (ref 11.7–15.4)
WBC: 4.2 x10E3/uL (ref 3.4–10.8)

## 2024-10-20 LAB — LIPASE: Lipase: 26 U/L (ref 14–72)

## 2024-10-20 LAB — CMP14+EGFR
ALT: 29 IU/L (ref 0–32)
AST: 21 IU/L (ref 0–40)
Albumin: 3.9 g/dL (ref 3.9–4.9)
Alkaline Phosphatase: 99 IU/L (ref 41–116)
BUN/Creatinine Ratio: 13 (ref 9–23)
BUN: 11 mg/dL (ref 6–24)
Bilirubin Total: 0.9 mg/dL (ref 0.0–1.2)
CO2: 23 mmol/L (ref 20–29)
Calcium: 8.9 mg/dL (ref 8.7–10.2)
Chloride: 103 mmol/L (ref 96–106)
Creatinine, Ser: 0.85 mg/dL (ref 0.57–1.00)
Globulin, Total: 1.7 g/dL (ref 1.5–4.5)
Glucose: 173 mg/dL — ABNORMAL HIGH (ref 70–99)
Potassium: 4.3 mmol/L (ref 3.5–5.2)
Sodium: 140 mmol/L (ref 134–144)
Total Protein: 5.6 g/dL — ABNORMAL LOW (ref 6.0–8.5)
eGFR: 83 mL/min/1.73 (ref >59)

## 2024-10-20 NOTE — Progress Notes (Addendum)
   Subjective:    Patient ID: Frances [name], female    DOB: 48 y.o., 1976/08/22   MRN: [id number]  Chief Complaint: Rotator cuff disease + impingement follow-up  Discussed the use of AI scribe software for clinical note transcription with the patient, who gave verbal consent to proceed.  History of Present Illness Frances [name] is a 48 year old female who presents with persistent shoulder pain following a prior injection.  Shoulder pain - Persistent pain localized to the anterior and lateral shoulder - Pain exacerbated by abduction, reaching out, or reaching behind - Pain remains significant during activities such as reaching back to grab her purse or sleeping with her arm over her head - Anterior shoulder pain is somewhat improved but still present as a slight ache - Pain intensity has decreased from being constant, allowing continuation of daily activities without significant limitation  Response to prior injection - Prior injection provided initial relief with an estimated 75% overall improvement in pain lasting at least 2 months - Anterior shoulder pain improved somewhat after injection, but significant pain persists with certain movements  Physical therapy utilization - Attending physical therapy for shoulder pain - Concerned about the number of physical therapy visits covered by insurance - Previously utilized a free physical therapy clinic at [facility] for a back issue related to her job     Objective:   There were no vitals filed for this visit.  Right shoulder: Mild tenderness to palpation in posterior shoulder musculature, biceps tendon. 5 out of 5 strength with external rotation, internal rotation, empty can, subscap liftoff + Neer.  Equivocal Hawkins.  Limited ultrasound examination of the right shoulder: Peritendinous fluid surrounding the biceps tendon Calcifications present in the subscapularis, supraspinatus, infraspinatus  tendons There appears to be an interstitial tear present in the supraspinatus.  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 Calcifications indicate previous small tears and bleeding. Her strength is good, and she reports a 25% improvement in symptoms. The condition does not significantly limit daily activities. Continue physical therapy for another month to assess further improvement. Consider prolotherapy if no further improvement is noted after one month. Shockwave therapy was discussed but is not feasible due to cost. Consider MRI if symptoms persist or worsen to evaluate the extent of the tear.  Right biceps tendinopathy with peritendinous fluid and calcification   Ultrasound shows fluid around the biceps tendon, indicating inflammation and irritation. Calcifications suggest chronic tendinopathy. Consider injection around the biceps tendon if symptoms persist after one month. Reassess in four weeks.  Right acromioclavicular joint osteoarthritis   Ultrasound shows mild arthritis in the [facility] joint, but it is not the primary source of pain. No specific intervention required at this time.   Addendum: Please see portion of HPI denoting Frances [name]'s prior response to epidural steroid injections providing her with approximately 75% relief for at least 2 months.

## 2024-10-21 ENCOUNTER — Other Ambulatory Visit: Payer: Self-pay | Admitting: Physician Assistant

## 2024-10-21 ENCOUNTER — Ambulatory Visit: Payer: Self-pay | Admitting: Physician Assistant

## 2024-10-21 ENCOUNTER — Other Ambulatory Visit (INDEPENDENT_AMBULATORY_CARE_PROVIDER_SITE_OTHER): Payer: MEDICAID

## 2024-10-21 ENCOUNTER — Encounter: Payer: Self-pay | Admitting: Physician Assistant

## 2024-10-21 ENCOUNTER — Ambulatory Visit (INDEPENDENT_AMBULATORY_CARE_PROVIDER_SITE_OTHER): Payer: MEDICAID | Admitting: Professional

## 2024-10-21 DIAGNOSIS — J302 Other seasonal allergic rhinitis: Secondary | ICD-10-CM

## 2024-10-21 DIAGNOSIS — F411 Generalized anxiety disorder: Secondary | ICD-10-CM | POA: Diagnosis not present

## 2024-10-21 DIAGNOSIS — F419 Anxiety disorder, unspecified: Secondary | ICD-10-CM

## 2024-10-21 DIAGNOSIS — R0989 Other specified symptoms and signs involving the circulatory and respiratory systems: Secondary | ICD-10-CM

## 2024-10-21 DIAGNOSIS — F319 Bipolar disorder, unspecified: Secondary | ICD-10-CM

## 2024-10-21 DIAGNOSIS — F900 Attention-deficit hyperactivity disorder, predominantly inattentive type: Secondary | ICD-10-CM

## 2024-10-21 DIAGNOSIS — J3489 Other specified disorders of nose and nasal sinuses: Secondary | ICD-10-CM

## 2024-10-21 MED ORDER — DIAZEPAM 5 MG PO TABS: 5.0000 mg | ORAL_TABLET | Freq: Four times a day (QID) | ORAL | 0 refills | Status: DC | PRN

## 2024-10-21 NOTE — Progress Notes (Signed)
 [facility] Counselor/Therapist Progress Note  Patient ID: Frances [name], MRN: [id number],    Date: 10/21/2024  Time Spent: 45 minutes 10-1045am   Treatment Type: Individual Therapy  Risk Assessment: Danger to Self:  No Self-injurious Behavior: No Danger to Others: No  Subjective: The patient arrived on time for her inperson session.  Issues addressed: 1-mood 10/26/2024    PHQ2-9 Depression Screening   Little interest or pleasure in doing things More than half the days  Feeling down, depressed, or hopeless Several days  PHQ-2 - Total Score 3  Trouble falling or staying asleep, or sleeping too much Nearly every day  Feeling tired or having little energy Nearly every day  Poor appetite or overeating  Several days  Feeling bad about yourself - or that you are a failure or have let yourself or your family down Not at all  Trouble concentrating on things, such as reading the newspaper or watching television Nearly every day  Moving or speaking so slowly that other people could have noticed.  Or the opposite - being so fidgety or restless that you have been moving around a lot more than usual Not at all  Thoughts that you would be better off dead, or hurting yourself in some way Not at all  PHQ2-9 Total Score 13  If you checked off any problems, how difficult have these problems made it for you to do your work, take care of things at home, or get along with other people Somewhat difficult  Depression Interventions/Treatment Currently on Treatment, Counseling  2-trauma -Father a-pt read her letter to her father -she wishes she were dead but then stated she hopes he is alive so he is suffering -questioning his love for her -questioning what emotional reaction she should have -pt unsure to forgive to help her move on but she doesn't know how to do b-pt wrote again this time about abandonment -pt felt like she wasn't good enough 3-introduced EMDR for patient to  consider addressing childhood trauma -education on how trauma impacts the brain and how EMDR rewires -showed brief intro to an EMDR session and requested pt watch the remainder at home  Treatment Plan Problems: Depression, Financial Stress, Grief /Loss Unresolved, Medical Issues Symptoms: A diagnosis of a chronic illness that is not life-threatening but necessitates changes in living. A long-term lack of discipline in money management that has led to excessive indebtedness. A feeling of low self-esteem and hopelessness that is associated with the lack of sufficient income to cover the cost of living. Loss of income due to unemployment. Strong emotional response of sadness exhibited when losses are discussed. Serial losses in life (i.e., deaths, divorces, jobs) that led to depression and discouragement. Sad affect, social withdrawal, anxiety, loss of interest in activities, and low energy. History of chronic or recurrent depression for which the client has taken antidepressant medication, been hospitalized, had outpatient treatment, or had a course of electroconvulsive therapy. Poor concentration and indecisiveness. Sleeplessness or hypersomnia. Low self-esteem. Lack of energy. Diminished interest in or enjoyment of activities. Depressed or irritable mood. Avoidance of talking on anything more than a superficial level about the loss. Unresolved grief issues. Lack of appetite, weight loss, and/or insomnia as well as other depression signs that occurred since the loss. Decrease or loss of appetite. Goals: Accept emotional support from those who care, without pushing them away in anger. Achieve an inner strength to control personal impulses, cravings, and desires that directly or indirectly increase debt irresponsibly. Begin a  healthy grieving process around the loss. Develop an awareness of how the avoidance of grieving has affected life and begin the healing process. Gain a new sense of  self-worth in which the substance of one's value is not attached to the capacity to do things or own things that cost money. Live life to the fullest extent possible, even though remaining time may be limited. Recognize, accept, and cope with feelings of depression. Resolve financial crisis with a path to eliminate debt. Work through the grieving process and face with peace the reality of own death. Objectives target date for all objectives is 09/08/2025: Identify the losses or limitations that have been experienced due to the medical condition. Identify feelings associated with the medical condition. Work with therapist to develop a plan for coping with stress. Learn and implement skills for managing stress. Implement mindfulness techniques for relapse prevention. Identify and replace thoughts and beliefs that support depression. Learn and implement behavioral strategies to overcome depression. Identify and voice positives about the deceased loved one including previous positive experiences, positive characteristics, positive aspects of the relationship, and how these things may be remembered. Set financial goals and make budgetary decisions with partner, allowing for equal input and balanced control over financial matters. Write a budget that balances income with expenses. Identify priorities that should control how money is spent. Begin verbalizing feelings associated with the loss. Identify how avoiding dealing with loss has negatively impacted life. Identify what stages of grief have been experienced in the continuum of the grieving process. Describe history, symptoms, and treatment of the medical condition. Interventions: Review the client's budget as to reasonableness and completeness. Reinforce changes in managing money that reflect compromise, responsible planning, and respectful cooperation with the client's partner. Ask the client to list the priorities that he/she believes should  give direction to how his/her money is spent; process those priorities. Review the client's spending history to discover what priorities and values have misdirected spending. Ask the client to list ways that avoidance of grieving has negatively impacted his/her life. Ask the client to discuss and/or list the positive aspects of and memories about his/her relationship with the lost loved one; reinforce the client's expression of positive memories and emotions (e.g., smiling, laughing); encourage the client to share these thoughts with supportive loved ones. Educate the client on the stages of the grieving process and answer any questions he/she may have. Assist the client in identifying the stages of grief that he/she has experienced and which stage he/she is presently working through. Ask the client to bring pictures or mementos connected with his/her loss to a session and talk about them (or assign Creating a Patent Examiner in the Adult Psychotherapy Administrator, Arts by Jenniffer). Assist the client in identifying and expressing feelings connected with his/her loss. In a collaborative fashion, develop a therapeutic alliance while gathering a history of the condition, including symptoms, client's reactions to the diagnosis, treatments of the condition, and prognosis. Assist the client in developing a tailored coping action plan for preventing and/or managing identified stressful reactions using skills such as relaxation, exercise, cognitive reframing, and problem-solving. Conduct skills training, building upon effective coping strategies the client possesses, and teaching new skills tailored to the specific stressor. Assist the client in identifying, sorting through, and verbalizing the various feelings generated by his/her medical condition. Ask the client to list the changes, losses, or limitations that have resulted from the medical condition (or assign The Impact of My Illness in the Adult  Psychotherapy Homework Planner  by Jenniffer). Assign the client to self-monitor thoughts, feelings, and actions in daily journal (e.g., Negative Thoughts Trigger Negative Feelings in the Adult Psychotherapy Homework Planner by Jenniffer; Daily Record of Dysfunctional Thoughts in Cognitive Therapy of Depression by Almarie Candida Gentry and Shona); process the journal material to challenge depressive thinking patterns and replace them with reality-based thoughts. Assign behavioral experiments in which depressive automatic thoughts are treated as hypotheses/prediction, reality-based alternative hypotheses/prediction are generated, and both are tested against the client's past, present, and/or future experiences. Assist the client in developing skills that increase the likelihood of deriving pleasure from behavioral activation (e.g., assertiveness skills, developing an exercise plan, less internal/more external focus, increased social involvement); reinforce success. Use mindfulness meditation and cognitive therapy techniques to help the client learn to recognize and regulate the negative thought processes associated with depression and to change his/her relationship with these thoughts (see Mindfulness-Based Cognitive Therapy for Depression by Kriste Pouch, and Jil). Work to increase the client's new sense of well-being by building his/her personal strengths evident in their progress through therapy (or assign Acknowledging My Strengths and/or What Are My Good Qualities? in the Adult Psychotherapy Homework Planner by Jenniffer).  Diagnosis:Bipolar I disorder (HCC)  Generalized anxiety disorder  Attention deficit hyperactivity disorder (ADHD), predominantly inattentive type  Plan:  -encouraged pt to continue writing -pt to review EMDR demo and come prepared to discuss her interest in this tx modality -meet again on Friday, November 04, 2024 at 10am in person

## 2024-10-21 NOTE — Progress Notes (Signed)
 [name],   No concerns with labs. Protein a little low. Increase protein in diet.  No elevation of WBC.

## 2024-10-21 NOTE — Progress Notes (Signed)
 Established Patient Office Visit  Subjective   Patient ID: EAVAN GONTERMAN, female    DOB: [birth year]  Age: 48 y.o. MRN: [id number]  Chief Complaint  Patient presents with   Medical Management of Chronic Issues    Vision changes     HPI Discussed the use of AI scribe software for clinical note transcription with the patient, who gave verbal consent to proceed.  History of Present Illness Miriya Cloer is a 48 year old female who presents with dizziness and vision changes.  Orthostatic dizziness and hypotension - Dizziness occurs when standing, associated with orthostatic hypotension - Blood pressure readings: 126/81 mmHg lying down, 128/71 mmHg sitting, 104/60 mmHg standing - Describes sensation as 'whoa' with blurred vision during episodes - Symptoms resolve after a few minutes - No head-spinning dizziness or vertigo - No falls reported - Takes Flomax  every other day - No diuretic use - Attempts to increase salt intake by adding it to potatoes and rice  Visual disturbances - Vision changes described as a 'checkerboard look' and sometimes inability to see clearly - Episodes occur with positional changes, such as standing up - Blurred vision accompanies dizziness - Last eye examination in the spring  Fatigue - Unusual tiredness, often falling asleep early in the evening - Wakes up feeling as though she has slept for a long time  Gastrointestinal symptoms - Recent diarrhea followed by constipation - Currently following the BRAT diet - No fever, chills, or new medications - No urinary symptoms  Metabolic and dietary status - Recent blood glucose reading of 109 mg/dL - Diet has been more consistent recently  Negative review of systems - No ear pain, congestion, or respiratory symptoms    ROS See HPI.    Objective:     BP 104/63   Pulse 88   Ht 5' 7 (1.702 m)   Wt 173 lb (78.5 kg)   SpO2 99%   BMI 27.10 kg/m  BP Readings from Last 3  Encounters:  10/20/24 94/62  10/19/24 104/63  10/10/24 98/68   Wt Readings from Last 3 Encounters:  10/20/24 173 lb (78.5 kg)  10/19/24 173 lb (78.5 kg)  10/10/24 173 lb (78.5 kg)    Physical Exam Constitutional:      Appearance: Normal appearance.  HENT:     Head: Normocephalic.  Cardiovascular:     Rate and Rhythm: Normal rate and regular rhythm.  Pulmonary:     Effort: Pulmonary effort is normal.     Breath sounds: Normal breath sounds.  Abdominal:     General: There is no distension.     Palpations: Abdomen is soft. There is no mass.     Tenderness: There is no abdominal tenderness. There is no right CVA tenderness, left CVA tenderness or guarding.  Neurological:     General: No focal deficit present.     Mental Status: She is alert.  Psychiatric:        Mood and Affect: Mood normal.       The 10-year ASCVD risk score (Arnett DK, et al., 2019) is: 1.2%    Assessment & Plan:  SABRASABRADeidra Spease was seen today for medical management of chronic issues.  Diagnoses and all orders for this visit:  Orthostatic hypotension -     CBC w/Diff/Platelet -     Lipase -     CMP14+EGFR  Chronic right-sided low back pain without sciatica -     ketorolac  (TORADOL ) injection 60 mg  Vision changes -  CBC w/Diff/Platelet -     CMP14+EGFR  Viral gastroenteritis -     CBC w/Diff/Platelet -     Lipase -     CMP14+EGFR  Hypotension, unspecified hypotension type -     CBC w/Diff/Platelet -     CMP14+EGFR   Assessment & Plan Orthostatic hypotension Orthostatic hypotension confirmed by significant blood pressure drop upon standing. With systolic drop of more than .  Symptoms include dizziness, blurred vision, and fatigue. No diuretics or antihypertensive medications contributing. Possible contributing factors include low dietary salt intake and recent gastrointestinal issues. Differential includes vision changes potentially related to Ozempic  use, but not  consistent with detachment symptoms. Non-pharmacological interventions preferred due to treatment complexity. - Advise increased dietary salt intake with options like salted nuts, pretzels, and chips. - Recommend hydration and use of compression stockings. - Suggest trial of longer periods without Flomax  if blood pressure feels low. - Order blood work to check electrolytes and CBC. - Refer to ophthalmologist for eye examination to rule out vision changes related to Ozempic . - seems like patient has had some recent viral gastroenteritis that could have triggered this as well  Viral gastroenteritis - supportive care with BRAT diet and hydration  - improving  Chronic low back pain - Toradol  60mg  IM given on request today - on norco via pain contract - continue symptomatic care with stretches, tens unit, icy hot patches.  General Health Maintenance Discussed recent tiredness and potential viral infections. No recent changes in medications or significant new symptoms.    Rydan Gulyas, PA-C

## 2024-10-25 ENCOUNTER — Other Ambulatory Visit: Payer: MEDICAID

## 2024-10-26 ENCOUNTER — Encounter: Payer: Self-pay | Admitting: Professional

## 2024-10-28 ENCOUNTER — Telehealth (INDEPENDENT_AMBULATORY_CARE_PROVIDER_SITE_OTHER): Payer: MEDICAID | Admitting: Psychiatry

## 2024-10-28 ENCOUNTER — Encounter (HOSPITAL_COMMUNITY): Payer: Self-pay | Admitting: Psychiatry

## 2024-10-28 DIAGNOSIS — F900 Attention-deficit hyperactivity disorder, predominantly inattentive type: Secondary | ICD-10-CM | POA: Diagnosis not present

## 2024-10-28 DIAGNOSIS — F319 Bipolar disorder, unspecified: Secondary | ICD-10-CM | POA: Diagnosis not present

## 2024-10-28 DIAGNOSIS — F411 Generalized anxiety disorder: Secondary | ICD-10-CM

## 2024-10-28 MED ORDER — BUPROPION HCL ER (XL) 300 MG PO TB24: 300.0000 mg | ORAL_TABLET | Freq: Every day | ORAL | 3 refills | Status: DC

## 2024-10-28 MED ORDER — ARIPIPRAZOLE 15 MG PO TABS: 15.0000 mg | ORAL_TABLET | Freq: Every day | ORAL | 3 refills | Status: DC

## 2024-10-28 NOTE — Progress Notes (Signed)
 BH MD/PA/NP OP Progress Note Virtual Visit via Video Note  I connected with Frances [name] on 10/28/24 at 11:00 AM EST by a video enabled telemedicine application and verified that I am speaking with the correct person using two identifiers.  Location: Patient: Home Provider: Clinic   I discussed the limitations of evaluation and management by telemedicine and the availability of in person appointments. The patient expressed understanding and agreed to proceed.  I provided 30 minutes of non-face-to-face time during this encounter.              10/28/2024 11:22 AM Frances [name]  MRN:  [id number]  Chief Complaint: I can't cry  HPI: 48 year old female seen today for follow up psychiatric evaluation. She has a psychiatric history of bipolar 1, borderline personality disorder, depression, and anxiety. She is currently managed on Abilify  15 mg nightly and Wellbutrin  300 mg daily and Cymbalta   60 mg twice daily (from rheumatologist). Today, patient notes that medications are effective in managing her symptoms.   Today the patient was well-groomed, pleasant, cooperative, engaged in eye contact, and engaged in conversation. She informed clinical research associate that she is concerned because she is unable to cry.  She notes that her feelings are blunted.  Provider informed patient that at times antidepressant can have that effect.  Provider informed patient that if medications needed to be adjusted.  At this time notes that she is comfortable with her current dose as she feels mentally stable.    Overall she notes that her mood, anxiety, depression are well-managed.  Today provider conducted GAD-7 and patient score a 3.  Provider conducted PHQ-9 and patient scored a 9.  She endorses fluctuations in sleep and reports that her appetite is adequate.  Today she denies SI/HI/AVH, mania, paranoia.    Patient informed clinical research associate that she has a new therapist and she is enjoying her sessions.    Patient  informed clinical research associate that she continues to have chronic pain.  She notes that the increase in her Cymbalta  by her rheumatologist was somewhat effective.  She quantifies her pain today is 5 out of 10.  No medication changes made today.  Patient agreeable to continue medication as prescribed.  No other concerns noted at this time.      Visit Diagnosis:    ICD-10-CM   1. Bipolar I disorder (HCC)  F31.9 ARIPiprazole  (ABILIFY ) 15 MG tablet    buPROPion  (WELLBUTRIN  XL) 300 MG 24 hr tablet    2. Attention deficit hyperactivity disorder (ADHD), predominantly inattentive type  F90.0 buPROPion  (WELLBUTRIN  XL) 300 MG 24 hr tablet            Past Psychiatric History: Bipolar, anxiety, and depression Past Medical History:  Past Medical History:  Diagnosis Date   Anxiety    Arthritis    Back pain, chronic    Bipolar 1 disorder (HCC)    Chronic fatigue syndrome    DDD (degenerative disc disease), lumbar    Depression    Diabetes mellitus without complication (HCC)    Fibromyalgia    IUD 2012   Mirena   MTHFR gene mutation    Rheumatoid arteritis (HCC)     Past Surgical History:  Procedure Laterality Date   BREAST REDUCTION SURGERY Bilateral 01/07/2024   Procedure: Bilateral breast reduction with liposuction;  Surgeon: Lowery Estefana RAMAN, DO;  Location: [facility];  Service: Plastics;  Laterality: Bilateral;   COLONOSCOPY WITH PROPOFOL  N/A 02/25/2017   Procedure: COLONOSCOPY WITH PROPOFOL ;  Surgeon: Elsie Cree, MD;  Location: [facility];  Service: Endoscopy;  Laterality: N/A;   left knee lateral release  1993   scar tisue removal  03/2005   left ankle   TONSILLECTOMY  age 24's    Family Psychiatric History: Maternal aunts Bipolar disorder and uncle alcoholic  Family History:  Family History  Problem Relation Age of Onset   Diabetes Mother    Stroke Mother    Lupus Mother    Hypertension Mother    Congestive Heart Failure Mother    Mental illness Brother         Not clear what his diagnosis is--may be related to previous drug use.   Cancer Maternal Grandmother        colon   Diabetes Maternal Grandfather    Depression Maternal Uncle    Alcohol abuse Maternal Uncle     Social History:  Social History   Socioeconomic History   Marital status: Single    Spouse name: Not on file   Number of children: 0   Years of education: Not on file   Highest education level: Bachelor's degree (e.g., BA, AB, BS)  Occupational History   Occupation: Airline pilot at times  Tobacco Use   Smoking status: Never   Smokeless tobacco: Never  Vaping Use   Vaping status: Never Used  Substance and Sexual Activity   Alcohol use: No    Alcohol/week: 0.0 standard drinks of alcohol   Drug use: No   Sexual activity: Not on file  Other Topics Concern   Not on file  Social History Narrative   Originally from [place] , outside of [place].    Moved here permanently 2012.   Intermittently worked on a farm.   Lives with many cats--3 plus fosters cats.    Single, lives alone in a one story home. Rarely drinks caffeine . 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   Social Drivers of Health   Financial Resource Strain: Medium Risk (06/12/2023)   Overall Financial Resource Strain (CARDIA)    Difficulty of Paying Living Expenses: Somewhat hard  Food Insecurity: Food Insecurity Present (06/12/2023)   Hunger Vital Sign    Worried About Running Out of Food in the Last Year: Sometimes true    Ran Out of Food in the Last Year: Never true  Transportation Needs: No Transportation Needs (06/12/2023)   PRAPARE - Administrator, Civil Service (Medical): No    Lack of Transportation (Non-Medical): No  Physical Activity: Insufficiently Active (07/27/2023)   Exercise Vital Sign    Days of Exercise per Week: 3 days    Minutes of Exercise per Session: 30 min  Stress: No Stress Concern Present (06/12/2023)   [employer] of Occupational Health  - Occupational Stress Questionnaire    Feeling of Stress : Only a little  Social Connections: Socially Isolated (06/12/2023)   Social Connection and Isolation Panel    Frequency of Communication with Friends and Family: Never    Frequency of Social Gatherings with Friends and Family: Twice a week    Attends Religious Services: Never    Database Administrator or Organizations: Yes    Attends Engineer, Structural: More than 4 times per year    Marital Status: Never married    Allergies:  Allergies  Allergen Reactions   Metaxalone Hives    Metabolic Disorder Labs: Lab Results  Component Value Date   HGBA1C 5.6 09/07/2024   MPG 103 04/14/2018  Lab Results  Component Value Date   PROLACTIN 16.4 09/02/2023   Lab Results  Component Value Date   CHOL 132 09/02/2023   TRIG 201 (H) 09/02/2023   HDL 39 (L) 09/02/2023   CHOLHDL 3.4 09/02/2023   LDLCALC 60 09/02/2023   LDLCALC 41 09/10/2022   Lab Results  Component Value Date   TSH 1.030 09/02/2023   TSH 0.866 11/21/2020    Therapeutic Level Labs: No results found for: LITHIUM No results found for: VALPROATE No results found for: CBMZ  Current Medications: Current Outpatient Medications  Medication Sig Dispense Refill   ARIPiprazole  (ABILIFY ) 15 MG tablet Take 1 tablet (15 mg total) by mouth daily. 30 tablet 3   atorvastatin  (LIPITOR) 20 MG tablet TAKE 1 TABLET BY MOUTH DAILY 90 tablet 0   Azelastine  HCl 137 MCG/SPRAY SOLN USE 2 SPRAYS IN ALTERNATE NOSTRILS TWICE A DAY 30 mL 0   blood glucose meter kit and supplies KIT Dispense based on patient and insurance preference. Use up to four times daily as directed. 1 each 0   buPROPion  (WELLBUTRIN  XL) 300 MG 24 hr tablet Take 1 tablet (300 mg total) by mouth daily. 30 tablet 3   cholecalciferol (VITAMIN D3) 25 MCG (1000 UT) tablet Take 1,000 Units by mouth daily.     diazepam  (VALIUM ) 5 MG tablet Take 1 tablet (5 mg total) by mouth every 6 (six) hours as needed  (Take one time, 20-30 minutes before epidural injection). 1 tablet 0   diclofenac  (VOLTAREN ) 75 MG EC tablet Take 1 tablet (75 mg total) by mouth 2 (two) times daily. 60 tablet 0   diclofenac  Sodium (VOLTAREN ) 1 % GEL Apply 2 g topically 4 (four) times daily. To affected joint. 100 g 11   DULoxetine  HCl 30 MG CSDR Take 90 mg by mouth daily.     fluticasone  (FLONASE ) 50 MCG/ACT nasal spray USE 2 SPRAYS IN EACH NOSTRIL DAILY 16 g 0   [START ON 11/06/2024] HYDROcodone -acetaminophen  (NORCO/VICODIN) 5-325 MG tablet Take one to one-half tablet every 12 hours for chronic pain. 90 tablet 0   [START ON 12/06/2024] HYDROcodone -acetaminophen  (NORCO/VICODIN) 5-325 MG tablet Take one to one-half tablet every 12 hours for chronic pain. 90 tablet 0   HYDROcodone -acetaminophen  (NORCO/VICODIN) 5-325 MG tablet Take one to one and one-half tablet every 12 hours for chronic pain. 90 tablet 0   [START ON 11/13/2024] HYDROcodone -acetaminophen  (NORCO/VICODIN) 5-325 MG tablet Take one to one and one-half tablet every 12 hours for chronic pain. 90 tablet 0   [START ON 12/13/2024] HYDROcodone -acetaminophen  (NORCO/VICODIN) 5-325 MG tablet Take one to one and one-half tablet every 12 hours for chronic pain. 90 tablet 0   hydroxychloroquine (PLAQUENIL) 200 MG tablet Take 200 mg by mouth 2 (two) times daily. 200mg  twice daily on weekdays and 200mg  once daily on weekends as of 12/03/22 review     levocetirizine (XYZAL ) 5 MG tablet TAKE 1 TABLET BY MOUTH IN THE EVENING 34 tablet 1   metFORMIN  (GLUCOPHAGE ) 1000 MG tablet Take 1 tablet (1,000 mg total) by mouth 2 (two) times daily with a meal. 180 tablet 0   montelukast  (SINGULAIR ) 10 MG tablet TAKE 1 TABLET BY MOUTH AT BEDTIME 90 tablet 3   pregabalin  (LYRICA ) 200 MG capsule TAKE 1 CAPSULE BY MOUTH 2-3 TIMES DAILY 90 capsule 2   Semaglutide , 1 MG/DOSE, (OZEMPIC , 1 MG/DOSE,) 4 MG/3ML SOPN Inject 1 mg into the skin once a week. 9 mL 0   solifenacin  (VESICARE ) 10 MG tablet Take 1  tablet (10 mg total) by mouth daily. 30 tablet 11   tamsulosin  (FLOMAX ) 0.4 MG CAPS capsule Take 1 capsule (0.4 mg total) by mouth daily. 90 capsule 1   tiZANidine  (ZANAFLEX ) 4 MG tablet TAKE 1 TABLET BY MOUTH EVERY 6 HOURS AS NEEDED FOR MUSCLE SPASMS 90 tablet 0   No current facility-administered medications for this visit.     Musculoskeletal: Strength & Muscle Tone: within normal limits and  telehealth visit Gait & Station: normal, telehealth visit Patient leans: N/A  Psychiatric Specialty Exam:   There were no vitals taken for this visit.There is no height or weight on file to calculate BMI.  General Appearance: Well Groomed  Eye Contact:  Good  Speech:  Clear and Coherent and Normal Rate  Volume:  Normal  Mood:  Euthymic  Affect:  Congruent  Thought Process:  Coherent, Goal Directed and Linear  Orientation:  Full (Time, Place, and Person)  Thought Content: WDL and Logical   Suicidal Thoughts:  No  Homicidal Thoughts:  No  Memory:  Immediate;   Good Recent;   Good Remote;   Good  Judgement:  Good  Insight:  Good  Psychomotor Activity:  Normal  Concentration:  Concentration: Good and Attention Span: Good  Recall:  Good  Fund of Knowledge: Good  Language: Good  Akathisia:  No  Handed:  Right  AIMS (if indicated): not done  Assets:  Communication Skills Desire for Improvement Financial Resources/Insurance Housing Social Support  ADL's:  Intact  Cognition: WNL  Sleep:  Good   Screenings: AIMS    Flowsheet Row Clinical Support from 06/30/2023 in [facility]  AIMS Total Score 5 0   AUDIT    Flowsheet Row [facility]  Alcohol Use Disorder Identification Test Final Score (AUDIT) 0   GAD-7    Flowsheet Row Video Visit from 10/28/2024 in [facility]  Video Visit from 06/02/2024 in [facility] Video Visit from 04/13/2024 in [facility] Video Visit from 01/21/2024 in [facility] Office Visit from 09/11/2023 in [facility] at [facility]  Total GAD-7 Score 3 8 4 1 2    PHQ2-9    Flowsheet Row Video Visit from 10/28/2024 in [facility] Counselor from 10/21/2024 in [facility] Behavioral Medicine at [facility] Office Visit from 06/07/2024 in [facility] Primary Care & Sports Medicine at [facility] Video Visit from 06/02/2024 in [facility] Video Visit from 04/13/2024 in [facility]  PHQ-2 Total Score 3 3 0 3 2  PHQ-9 Total Score 9 13 -- 11 9   Flowsheet Row UC from 09/13/2024 in [facility] [place] UC from 07/09/2024 in [facility] at [facility] Video Visit from 06/02/2024 in [facility]  C-SSRS RISK CATEGORY No Risk No Risk Error: Q7 should not be populated when Q6 is No     Assessment and Plan: Patient notes that at times her mood is blunted.  She notes that she finds it difficult to cry.  Provider informed patient that antidepressant can cause this effect.  At this time she finds medications effective.  No medication changes made today.  Patient agreeable to continue medication as prescribed. 1. Bipolar I disorder (HCC)  Continue- buPROPion  (WELLBUTRIN  XL) 300 MG 24 hr tablet; Take 1 tablet (300 mg total) by mouth daily.  Dispense: 30 tablet; Refill: 3 Continue- ARIPiprazole  (ABILIFY ) 15 MG tablet; Take 1 tablet (15 mg total) by mouth daily.  Dispense: 30 tablet; Refill: 3  2. Attention deficit hyperactivity disorder (ADHD), predominantly inattentive type  Continue- buPROPion  (WELLBUTRIN  XL) 300 MG 24 hr tablet; Take 1 tablet (300 mg total) by mouth  daily.  Dispense: 30 tablet; Refill: 3  3. Generalized anxiety disorder (Primary)  Start- busPIRone  (BUSPAR ) 10 MG tablet; Take 1 tablet (10 mg total) by mouth 3 (three) times daily.  Dispense: 90 tablet; Refill: 3   Follow up in 3 months Follow up with therapy   Zane FORBES Bach, NP 10/28/2024, 11:22 AM

## 2024-11-04 ENCOUNTER — Ambulatory Visit: Payer: MEDICAID | Admitting: Professional

## 2024-11-04 ENCOUNTER — Encounter: Payer: Self-pay | Admitting: Professional

## 2024-11-04 DIAGNOSIS — F411 Generalized anxiety disorder: Secondary | ICD-10-CM

## 2024-11-04 DIAGNOSIS — F900 Attention-deficit hyperactivity disorder, predominantly inattentive type: Secondary | ICD-10-CM

## 2024-11-04 DIAGNOSIS — F319 Bipolar disorder, unspecified: Secondary | ICD-10-CM

## 2024-11-04 NOTE — Progress Notes (Signed)
 [facility] Counselor/Therapist Progress Note  Patient ID: Frances [name], MRN: [id number],    Date: 11/04/2024  Time Spent: 56 minutes 1008-1104am   Treatment Type: Individual Therapy  Risk Assessment: Danger to Self:  No Self-injurious Behavior: No Danger to Others: No  Subjective: The patient arrived on time for her inperson session.  Issues addressed: 1-mood -engaged, appears to have slight smile and admits she is happy today 2-working with horses -pt has been having a good week helping a friend -she has felt productive but quickly pays for extending herself by being totally spent -pt admits she can only function about two hours a day and that when she does more than that she is down for at least the next day 3-discussed EMDR with pt -watched about five minutes of an online demo -pt reports she is willing to try though skeptical -educated pt on phases of EMDR treatment 4-writing -t did not continue writing and stated she didn't know she was to continue -suggested pt getting her feelings out  Treatment Plan Problems: Depression, Financial Stress, Grief /Loss Unresolved, Medical Issues Symptoms: A diagnosis of a chronic illness that is not life-threatening but necessitates changes in living. A long-term lack of discipline in money management that has led to excessive indebtedness. A feeling of low self-esteem and hopelessness that is associated with the lack of sufficient income to cover the cost of living. Loss of income due to unemployment. Strong emotional response of sadness exhibited when losses are discussed. Serial losses in life (i.e., deaths, divorces, jobs) that led to depression and discouragement. Sad affect, social withdrawal, anxiety, loss of interest in activities, and low energy. History of chronic or recurrent depression for which the client has taken antidepressant medication, been hospitalized, had outpatient treatment, or had a  course of electroconvulsive therapy. Poor concentration and indecisiveness. Sleeplessness or hypersomnia. Low self-esteem. Lack of energy. Diminished interest in or enjoyment of activities. Depressed or irritable mood. Avoidance of talking on anything more than a superficial level about the loss. Unresolved grief issues. Lack of appetite, weight loss, and/or insomnia as well as other depression signs that occurred since the loss. Decrease or loss of appetite. Goals: Accept emotional support from those who care, without pushing them away in anger. Achieve an inner strength to control personal impulses, cravings, and desires that directly or indirectly increase debt irresponsibly. Begin a healthy grieving process around the loss. Develop an awareness of how the avoidance of grieving has affected life and begin the healing process. Gain a new sense of self-worth in which the substance of one's value is not attached to the capacity to do things or own things that cost money. Live life to the fullest extent possible, even though remaining time may be limited. Recognize, accept, and cope with feelings of depression. Resolve financial crisis with a path to eliminate debt. Work through the grieving process and face with peace the reality of own death. Objectives target date for all objectives is 09/08/2025: Identify the losses or limitations that have been experienced due to the medical condition. Identify feelings associated with the medical condition. Work with therapist to develop a plan for coping with stress. Learn and implement skills for managing stress. Implement mindfulness techniques for relapse prevention. Identify and replace thoughts and beliefs that support depression. Learn and implement behavioral strategies to overcome depression. Identify and voice positives about the deceased loved one including previous positive experiences, positive characteristics, positive aspects of the  relationship, and how these things may be  remembered. Set financial goals and make budgetary decisions with partner, allowing for equal input and balanced control over financial matters. Write a budget that balances income with expenses. Identify priorities that should control how money is spent. Begin verbalizing feelings associated with the loss. Identify how avoiding dealing with loss has negatively impacted life. Identify what stages of grief have been experienced in the continuum of the grieving process. Describe history, symptoms, and treatment of the medical condition. Interventions: Review the client's budget as to reasonableness and completeness. Reinforce changes in managing money that reflect compromise, responsible planning, and respectful cooperation with the client's partner. Ask the client to list the priorities that he/she believes should give direction to how his/her money is spent; process those priorities. Review the client's spending history to discover what priorities and values have misdirected spending. Ask the client to list ways that avoidance of grieving has negatively impacted his/her life. Ask the client to discuss and/or list the positive aspects of and memories about his/her relationship with the lost loved one; reinforce the client's expression of positive memories and emotions (e.g., smiling, laughing); encourage the client to share these thoughts with supportive loved ones. Educate the client on the stages of the grieving process and answer any questions he/she may have. Assist the client in identifying the stages of grief that he/she has experienced and which stage he/she is presently working through. Ask the client to bring pictures or mementos connected with his/her loss to a session and talk about them (or assign Creating a Patent Examiner in the Adult Psychotherapy Administrator, Arts by Jenniffer). Assist the client in identifying and expressing feelings  connected with his/her loss. In a collaborative fashion, develop a therapeutic alliance while gathering a history of the condition, including symptoms, client's reactions to the diagnosis, treatments of the condition, and prognosis. Assist the client in developing a tailored coping action plan for preventing and/or managing identified stressful reactions using skills such as relaxation, exercise, cognitive reframing, and problem-solving. Conduct skills training, building upon effective coping strategies the client possesses, and teaching new skills tailored to the specific stressor. Assist the client in identifying, sorting through, and verbalizing the various feelings generated by his/her medical condition. Ask the client to list the changes, losses, or limitations that have resulted from the medical condition (or assign The Impact of My Illness in the Adult Psychotherapy Homework Planner by Jenniffer). Assign the client to self-monitor thoughts, feelings, and actions in daily journal (e.g., Negative Thoughts Trigger Negative Feelings in the Adult Psychotherapy Homework Planner by Jenniffer; Daily Record of Dysfunctional Thoughts in Cognitive Therapy of Depression by Almarie Candida Gentry and Shona); process the journal material to challenge depressive thinking patterns and replace them with reality-based thoughts. Assign behavioral experiments in which depressive automatic thoughts are treated as hypotheses/prediction, reality-based alternative hypotheses/prediction are generated, and both are tested against the client's past, present, and/or future experiences. Assist the client in developing skills that increase the likelihood of deriving pleasure from behavioral activation (e.g., assertiveness skills, developing an exercise plan, less internal/more external focus, increased social involvement); reinforce success. Use mindfulness meditation and cognitive therapy techniques to help the client learn to  recognize and regulate the negative thought processes associated with depression and to change his/her relationship with these thoughts (see Mindfulness-Based Cognitive Therapy for Depression by Kriste Pouch, and Jil). Work to increase the client's new sense of well-being by building his/her personal strengths evident in their progress through therapy (or assign Acknowledging My Strengths and/or What Are My Good Qualities?  in the Adult Psychotherapy Homework Planner by Jenniffer).  Diagnosis:Bipolar I disorder (HCC)  Attention deficit hyperactivity disorder (ADHD), predominantly inattentive type  Generalized anxiety disorder  Plan:  -encouraged pt to continue writing -pt to review EMDR demo and come prepared to discuss her interest in this tx modality -meet again on Tuesday, November 22, 2024 at [facility]

## 2024-11-15 ENCOUNTER — Ambulatory Visit (INDEPENDENT_AMBULATORY_CARE_PROVIDER_SITE_OTHER): Payer: MEDICAID

## 2024-11-15 ENCOUNTER — Other Ambulatory Visit: Payer: Self-pay | Admitting: Physician Assistant

## 2024-11-15 ENCOUNTER — Other Ambulatory Visit: Payer: Self-pay | Admitting: Family Medicine

## 2024-11-15 VITALS — BP 90/60 | Ht 67.0 in | Wt 173.0 lb

## 2024-11-15 DIAGNOSIS — M47816 Spondylosis without myelopathy or radiculopathy, lumbar region: Secondary | ICD-10-CM | POA: Diagnosis not present

## 2024-11-15 DIAGNOSIS — M4800 Spinal stenosis, site unspecified: Secondary | ICD-10-CM

## 2024-11-15 DIAGNOSIS — M51362 Other intervertebral disc degeneration, lumbar region with discogenic back pain and lower extremity pain: Secondary | ICD-10-CM | POA: Diagnosis not present

## 2024-11-15 DIAGNOSIS — R0989 Other specified symptoms and signs involving the circulatory and respiratory systems: Secondary | ICD-10-CM

## 2024-11-15 DIAGNOSIS — J3489 Other specified disorders of nose and nasal sinuses: Secondary | ICD-10-CM

## 2024-11-15 DIAGNOSIS — J310 Chronic rhinitis: Secondary | ICD-10-CM

## 2024-11-15 NOTE — Progress Notes (Signed)
   Subjective:    Patient ID: Frances [name], female    DOB: 48 y.o., 1976-06-02   MRN: [id number]  Chief Complaint: Back pain follow-up (7 weeks)  Discussed the use of AI scribe software for clinical note transcription with the patient, who gave verbal consent to proceed.  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 She has been treated in the past with epidural steroid injections with modest benefit from these She presents today for reevaluation after she was last seen by myself on 10/10/2024 where I provided her with a back brace, and encouraged her to continue home exercises which she had been doing for minimum of 6 weeks prior to my evaluation of her on 10/10/2024 and since then has continued for an additional 7 weeks totaling approximately 13 weeks of professionally directed home exercises for her low back pain.  Patient reports to me that she received approximately 75% improvement from prior epidural steroid injections and wishes there to be an addendum made to my original consultation note.      Objective:   There were no vitals filed for this visit.  Const: appears well, non-toxic, well groomed Psych: affect bright, interactive, smiling EENT: EOMI intact, conjunctiva appear normal Neck: no obvious masses, appears symmetric Resp: non-labored, appears symmetric Neuro: muscle bulk appears normal Skin: no obvious rashes noted   Aylana's back exam was deferred today.  Assessment & Plan:   Assessment & Plan Frances [name] is a very pleasant 48 year old female with seronegative rheumatoid arthritis and known lumbar spondylosis with severe canal stenosis most prominent at L4-5 but also present at L3-4 presenting for follow-up  on low back pain.  I will make an addendum to my original consultation note denoting the proper amount of relief she has experienced from prior epidural steroid injections and we will fax these updated records to her insurance company to hopefully move forward with obtaining a repeat injection for her at that level. Consider referral to spinal physician in the future given focality of her spinal stenosis.

## 2024-11-20 NOTE — Progress Notes (Unsigned)
   Subjective:    Patient ID: Frances [name], female    DOB: 48 y.o., 1976/03/10   MRN: [id number]  Chief Complaint: Right shoulder rotator cuff disease with calcific tendinopathy and biceps tendinosis follow-up (4 weeks)  Discussed the use of AI scribe software for clinical note transcription with the patient, who gave verbal consent to proceed.  History of Present Illness Frances [name] is a pleasant 48 year old female presenting for follow-up on chronic right shoulder pain with known at least partial thickness tearing of her supraspinatus, calcific tendinopathy and biceps tendinosis.  She responded approximately 25% to a subacromial injection previously.  She has been in therapy addressing this since 08/23/24.  Today she reports continued pain and soreness despite use of 75 mg diclofenac  twice daily and assiduous use of home exercise program.  03/21/2023 right shoulder MRI: Moderate distal supraspinatus and mild distal infraspinatus tendinosis with bursal sided fraying. No high-grade or retracted cuff tear. No significant muscle atrophy. Mild subacromial-subdeltoid bursitis. Moderate intra-articular long head biceps tendinosis. Mild glenohumeral and AC joint osteoarthritis.  Objective:   There were no vitals filed for this visit.  Right shoulder: Tenderness to palpation present near the supraspinatus tendon insertion.  Significant tenderness over the biceps tendon. 4+ out of 5 strength with empty can.  5 out of 5 strength with external rotation, internal rotation, subscap liftoff + Neer.  Equivocal Hawkins.    Assessment & Plan:   Assessment & Plan Frances [name] is a pleasant 48 year old female with rotator cuff impingement and limited response to injections and to professionally directed home exercises which she has been engaged in since 08/23/2024.  Will obtain x-rays today on 11/21/2024.  Will follow these with MRI to further assess.  Follow-up results.

## 2024-11-21 ENCOUNTER — Ambulatory Visit: Payer: MEDICAID

## 2024-11-21 ENCOUNTER — Ambulatory Visit (HOSPITAL_BASED_OUTPATIENT_CLINIC_OR_DEPARTMENT_OTHER): Admission: RE | Admit: 2024-11-21 | Discharge: 2024-11-21 | Disposition: A | Payer: MEDICAID | Source: Ambulatory Visit

## 2024-11-21 ENCOUNTER — Ambulatory Visit: Payer: Self-pay

## 2024-11-21 VITALS — BP 118/80 | Ht 67.0 in | Wt 173.0 lb

## 2024-11-21 DIAGNOSIS — S46011A Strain of muscle(s) and tendon(s) of the rotator cuff of right shoulder, initial encounter: Secondary | ICD-10-CM

## 2024-11-21 DIAGNOSIS — M7541 Impingement syndrome of right shoulder: Secondary | ICD-10-CM | POA: Diagnosis not present

## 2024-11-21 NOTE — Addendum Note (Signed)
 Addended by: MARCINE HARLENE SAILOR on: 11/21/2024 11:14 AM   Modules accepted: Orders

## 2024-11-22 ENCOUNTER — Ambulatory Visit: Payer: MEDICAID | Admitting: Professional

## 2024-11-22 ENCOUNTER — Encounter: Payer: Self-pay | Admitting: Professional

## 2024-11-22 DIAGNOSIS — F319 Bipolar disorder, unspecified: Secondary | ICD-10-CM

## 2024-11-22 DIAGNOSIS — F900 Attention-deficit hyperactivity disorder, predominantly inattentive type: Secondary | ICD-10-CM

## 2024-11-22 DIAGNOSIS — F603 Borderline personality disorder: Secondary | ICD-10-CM

## 2024-11-22 DIAGNOSIS — F411 Generalized anxiety disorder: Secondary | ICD-10-CM

## 2024-11-22 NOTE — Progress Notes (Signed)
 [facility] Counselor/Therapist Progress Note  Patient ID: Frances [name], MRN: [id number],    Date: 11/22/2024  Time Spent: 47 minutes 107-151pm   Treatment Type: Individual Therapy  Risk Assessment: Danger to Self:  No Self-injurious Behavior: No Danger to Others: No  Subjective: The patient arrived on time for her inperson session.  Issues addressed: 1-encouraged pt to continue writing -pt to review EMDR demo and come prepared to discuss her interest in this tx modality 2-begin EMDR a-Abandonment of father Earliest memory 1rd birthday brought her a teddybear but he always leaves, he doesn't stay with use She doesn't have specific memories b-Feelings: fear of being left, felt alone in college and roommate had moved out in second semester and she had a room to herself but that's when things started setting in that she hated him and really didn't want to see him anymore Generally busy- was in sports and riding, schoolwork and whatever, in college she was only going to school and after freshman year babysat for a while; she had knee surgery in summer of freshman year and she couldn't do much; pt was home and still went to the farm and would videotape friends riding and hang out with friends; once in college came home for holidays being about three hours from home; her freshman year she did not have a school friend group, she got a job in sophomore year she lived with her aunt and uncle and two younger cousins and worked PT at a barn down the street; pt was home with children in the afternoons and evenings It felt weird to sit down and have a family member and have that father image there She thinks she grew used to living with her uncle and aunt; she was ery uncomfortable talking to non-family men She noticed herself able to have a conversation with a guy at school and she thinks it was exposure to her uncle, boarding in a guys house Her father never had a conversation  with her His yard backed up to their HS and her father only ever came to one track meet in 8th/9th grade and came only to one horse competition despite the barn being down the road She got her father to lease a horse for her for several months as a way to engage her father but it did not work c-Patient admits that she feels some jealousy and some wondering about why her brother and sister have a relationship with him Patient has not spoken to her brother in six years and she recently emailed him and his wife and he thought it might be spam and asked her sister Her brother and his wife did not respond Pt has no idea why her brother/SIL were not speaking to her -last time he saw her brother was when her mother was sick and he stayed at her brother's home -a year later she passed away and she did not talk to her brother, her sister provided her the information, but it was hours after her death -pt was latchkey kid, they did not speak and did not call each other; she talked to her about 5/yr -pt's mother was not affectionate, however, her brother ended up with the "burden" for doing everything for her since he was the only one in [place] -her brother got closer to their mother because of caring for her -her brother was mean to her growing up, she was bullied by her family -brother is 7-8 years older and sister is 2-2.5  years older -she would be okay if her father was not her blood  Treatment Plan Problems: Depression, Financial Stress, Grief /Loss Unresolved, Medical Issues Symptoms: A diagnosis of a chronic illness that is not life-threatening but necessitates changes in living. A long-term lack of discipline in money management that has led to excessive indebtedness. A feeling of low self-esteem and hopelessness that is associated with the lack of sufficient income to cover the cost of living. Loss of income due to unemployment. Strong emotional response of sadness exhibited when losses are  discussed. Serial losses in life (i.e., deaths, divorces, jobs) that led to depression and discouragement. Sad affect, social withdrawal, anxiety, loss of interest in activities, and low energy. History of chronic or recurrent depression for which the client has taken antidepressant medication, been hospitalized, had outpatient treatment, or had a course of electroconvulsive therapy. Poor concentration and indecisiveness. Sleeplessness or hypersomnia. Low self-esteem. Lack of energy. Diminished interest in or enjoyment of activities. Depressed or irritable mood. Avoidance of talking on anything more than a superficial level about the loss. Unresolved grief issues. Lack of appetite, weight loss, and/or insomnia as well as other depression signs that occurred since the loss. Decrease or loss of appetite. Goals: Accept emotional support from those who care, without pushing them away in anger. Achieve an inner strength to control personal impulses, cravings, and desires that directly or indirectly increase debt irresponsibly. Begin a healthy grieving process around the loss. Develop an awareness of how the avoidance of grieving has affected life and begin the healing process. Gain a new sense of self-worth in which the substance of one's value is not attached to the capacity to do things or own things that cost money. Live life to the fullest extent possible, even though remaining time may be limited. Recognize, accept, and cope with feelings of depression. Resolve financial crisis with a path to eliminate debt. Work through the grieving process and face with peace the reality of own death. Objectives target date for all objectives is 09/08/2025: Identify the losses or limitations that have been experienced due to the medical condition. Identify feelings associated with the medical condition. Work with therapist to develop a plan for coping with stress. Learn and implement skills for managing  stress. Implement mindfulness techniques for relapse prevention. Identify and replace thoughts and beliefs that support depression. Learn and implement behavioral strategies to overcome depression. Identify and voice positives about the deceased loved one including previous positive experiences, positive characteristics, positive aspects of the relationship, and how these things may be remembered. Set financial goals and make budgetary decisions with partner, allowing for equal input and balanced control over financial matters. Write a budget that balances income with expenses. Identify priorities that should control how money is spent. Begin verbalizing feelings associated with the loss. Identify how avoiding dealing with loss has negatively impacted life. Identify what stages of grief have been experienced in the continuum of the grieving process. Describe history, symptoms, and treatment of the medical condition. Interventions: Review the client's budget as to reasonableness and completeness. Reinforce changes in managing money that reflect compromise, responsible planning, and respectful cooperation with the client's partner. Ask the client to list the priorities that he/she believes should give direction to how his/her money is spent; process those priorities. Review the client's spending history to discover what priorities and values have misdirected spending. Ask the client to list ways that avoidance of grieving has negatively impacted his/her life. Ask the client to discuss and/or list the  positive aspects of and memories about his/her relationship with the lost loved one; reinforce the client's expression of positive memories and emotions (e.g., smiling, laughing); encourage the client to share these thoughts with supportive loved ones. Educate the client on the stages of the grieving process and answer any questions he/she may have. Assist the client in identifying the stages of grief  that he/she has experienced and which stage he/she is presently working through. Ask the client to bring pictures or mementos connected with his/her loss to a session and talk about them (or assign Creating a Patent Examiner in the Adult Psychotherapy Administrator, Arts by Jenniffer). Assist the client in identifying and expressing feelings connected with his/her loss. In a collaborative fashion, develop a therapeutic alliance while gathering a history of the condition, including symptoms, client's reactions to the diagnosis, treatments of the condition, and prognosis. Assist the client in developing a tailored coping action plan for preventing and/or managing identified stressful reactions using skills such as relaxation, exercise, cognitive reframing, and problem-solving. Conduct skills training, building upon effective coping strategies the client possesses, and teaching new skills tailored to the specific stressor. Assist the client in identifying, sorting through, and verbalizing the various feelings generated by his/her medical condition. Ask the client to list the changes, losses, or limitations that have resulted from the medical condition (or assign The Impact of My Illness in the Adult Psychotherapy Homework Planner by Jenniffer). Assign the client to self-monitor thoughts, feelings, and actions in daily journal (e.g., Negative Thoughts Trigger Negative Feelings in the Adult Psychotherapy Homework Planner by Jenniffer; Daily Record of Dysfunctional Thoughts in Cognitive Therapy of Depression by Almarie Candida Gentry and Shona); process the journal material to challenge depressive thinking patterns and replace them with reality-based thoughts. Assign behavioral experiments in which depressive automatic thoughts are treated as hypotheses/prediction, reality-based alternative hypotheses/prediction are generated, and both are tested against the client's past, present, and/or future experiences. Assist  the client in developing skills that increase the likelihood of deriving pleasure from behavioral activation (e.g., assertiveness skills, developing an exercise plan, less internal/more external focus, increased social involvement); reinforce success. Use mindfulness meditation and cognitive therapy techniques to help the client learn to recognize and regulate the negative thought processes associated with depression and to change his/her relationship with these thoughts (see Mindfulness-Based Cognitive Therapy for Depression by Kriste Pouch, and Jil). Work to increase the client's new sense of well-being by building his/her personal strengths evident in their progress through therapy (or assign Acknowledging My Strengths and/or What Are My Good Qualities? in the Adult Psychotherapy Homework Planner by Jenniffer).  Diagnosis:Bipolar I disorder (HCC)  Attention deficit hyperactivity disorder (ADHD), predominantly inattentive type  Generalized anxiety disorder  Borderline personality disorder (HCC)  Plan:  -closed session with grounding activity and review of pt's to do's after session -meet again on Friday, December 02, 2024 at [email or web address]

## 2024-11-30 ENCOUNTER — Other Ambulatory Visit: Payer: Self-pay

## 2024-11-30 DIAGNOSIS — M47816 Spondylosis without myelopathy or radiculopathy, lumbar region: Secondary | ICD-10-CM

## 2024-12-02 ENCOUNTER — Encounter: Payer: Self-pay | Admitting: Professional

## 2024-12-02 ENCOUNTER — Ambulatory Visit: Payer: MEDICAID | Admitting: Professional

## 2024-12-02 DIAGNOSIS — F603 Borderline personality disorder: Secondary | ICD-10-CM | POA: Diagnosis not present

## 2024-12-02 DIAGNOSIS — F319 Bipolar disorder, unspecified: Secondary | ICD-10-CM

## 2024-12-02 DIAGNOSIS — F900 Attention-deficit hyperactivity disorder, predominantly inattentive type: Secondary | ICD-10-CM | POA: Diagnosis not present

## 2024-12-02 DIAGNOSIS — F411 Generalized anxiety disorder: Secondary | ICD-10-CM | POA: Diagnosis not present

## 2024-12-02 NOTE — Progress Notes (Signed)
 [facility] Counselor/Therapist Progress Note  Patient ID: FREEDOM LOPEZPEREZ, MRN: [id number],    Date: 12/02/2024  Time Spent: 47 minutes 107-151pm   Treatment Type: Individual Therapy  Risk Assessment: Danger to Self:  No Self-injurious Behavior: No Danger to Others: No  Subjective: The patient arrived on time for her inperson session.  Issues addressed: 1-triggers -father's birthday -father's day -random times 2-what have you done in past to manage -pt doesn't want to feel anxiety and sadness when thinking of her father -coloring, using phone 3-has known Ed for 19 years -would like a life with him but doesn't know how to communicate -Ed has not dated anyone since her -pt admits that she trusts him and that she loves him -clinician suggested she consider allowing him to know   Treatment Plan Problems: Depression, Financial Stress, Grief /Loss Unresolved, Medical Issues Symptoms: A diagnosis of a chronic illness that is not life-threatening but necessitates changes in living. A long-term lack of discipline in money management that has led to excessive indebtedness. A feeling of low self-esteem and hopelessness that is associated with the lack of sufficient income to cover the cost of living. Loss of income due to unemployment. Strong emotional response of sadness exhibited when losses are discussed. Serial losses in life (i.e., deaths, divorces, jobs) that led to depression and discouragement. Sad affect, social withdrawal, anxiety, loss of interest in activities, and low energy. History of chronic or recurrent depression for which the client has taken antidepressant medication, been hospitalized, had outpatient treatment, or had a course of electroconvulsive therapy. Poor concentration and indecisiveness. Sleeplessness or hypersomnia. Low self-esteem. Lack of energy. Diminished interest in or enjoyment of activities. Depressed or irritable  mood. Avoidance of talking on anything more than a superficial level about the loss. Unresolved grief issues. Lack of appetite, weight loss, and/or insomnia as well as other depression signs that occurred since the loss. Decrease or loss of appetite. Goals: Accept emotional support from those who care, without pushing them away in anger. Achieve an inner strength to control personal impulses, cravings, and desires that directly or indirectly increase debt irresponsibly. Begin a healthy grieving process around the loss. Develop an awareness of how the avoidance of grieving has affected life and begin the healing process. Gain a new sense of self-worth in which the substance of one's value is not attached to the capacity to do things or own things that cost money. Live life to the fullest extent possible, even though remaining time may be limited. Recognize, accept, and cope with feelings of depression. Resolve financial crisis with a path to eliminate debt. Work through the grieving process and face with peace the reality of own death. Objectives target date for all objectives is 09/08/2025: Identify the losses or limitations that have been experienced due to the medical condition. Identify feelings associated with the medical condition. Work with therapist to develop a plan for coping with stress. Learn and implement skills for managing stress. Implement mindfulness techniques for relapse prevention. Identify and replace thoughts and beliefs that support depression. Learn and implement behavioral strategies to overcome depression. Identify and voice positives about the deceased loved one including previous positive experiences, positive characteristics, positive aspects of the relationship, and how these things may be remembered. Set financial goals and make budgetary decisions with partner, allowing for equal input and balanced control over financial matters. Write a budget that balances  income with expenses. Identify priorities that should control how money is spent. Begin verbalizing feelings associated  with the loss. Identify how avoiding dealing with loss has negatively impacted life. Identify what stages of grief have been experienced in the continuum of the grieving process. Describe history, symptoms, and treatment of the medical condition. Interventions: Review the client's budget as to reasonableness and completeness. Reinforce changes in managing money that reflect compromise, responsible planning, and respectful cooperation with the client's partner. Ask the client to list the priorities that he/she believes should give direction to how his/her money is spent; process those priorities. Review the client's spending history to discover what priorities and values have misdirected spending. Ask the client to list ways that avoidance of grieving has negatively impacted his/her life. Ask the client to discuss and/or list the positive aspects of and memories about his/her relationship with the lost loved one; reinforce the client's expression of positive memories and emotions (e.g., smiling, laughing); encourage the client to share these thoughts with supportive loved ones. Educate the client on the stages of the grieving process and answer any questions he/she may have. Assist the client in identifying the stages of grief that he/she has experienced and which stage he/she is presently working through. Ask the client to bring pictures or mementos connected with his/her loss to a session and talk about them (or assign Creating a Patent Examiner in the Adult Psychotherapy Administrator, Arts by Jenniffer). Assist the client in identifying and expressing feelings connected with his/her loss. In a collaborative fashion, develop a therapeutic alliance while gathering a history of the condition, including symptoms, client's reactions to the diagnosis, treatments of the condition, and  prognosis. Assist the client in developing a tailored coping action plan for preventing and/or managing identified stressful reactions using skills such as relaxation, exercise, cognitive reframing, and problem-solving. Conduct skills training, building upon effective coping strategies the client possesses, and teaching new skills tailored to the specific stressor. Assist the client in identifying, sorting through, and verbalizing the various feelings generated by his/her medical condition. Ask the client to list the changes, losses, or limitations that have resulted from the medical condition (or assign The Impact of My Illness in the Adult Psychotherapy Homework Planner by Jenniffer). Assign the client to self-monitor thoughts, feelings, and actions in daily journal (e.g., Negative Thoughts Trigger Negative Feelings in the Adult Psychotherapy Homework Planner by Jenniffer; Daily Record of Dysfunctional Thoughts in Cognitive Therapy of Depression by Almarie Candida Gentry and Shona); process the journal material to challenge depressive thinking patterns and replace them with reality-based thoughts. Assign behavioral experiments in which depressive automatic thoughts are treated as hypotheses/prediction, reality-based alternative hypotheses/prediction are generated, and both are tested against the client's past, present, and/or future experiences. Assist the client in developing skills that increase the likelihood of deriving pleasure from behavioral activation (e.g., assertiveness skills, developing an exercise plan, less internal/more external focus, increased social involvement); reinforce success. Use mindfulness meditation and cognitive therapy techniques to help the client learn to recognize and regulate the negative thought processes associated with depression and to change his/her relationship with these thoughts (see Mindfulness-Based Cognitive Therapy for Depression by Kriste Pouch, and  Jil). Work to increase the client's new sense of well-being by building his/her personal strengths evident in their progress through therapy (or assign Acknowledging My Strengths and/or What Are My Good Qualities? in the Adult Psychotherapy Homework Planner by Jenniffer).  Diagnosis:Bipolar I disorder (HCC)  Attention deficit hyperactivity disorder (ADHD), predominantly inattentive type  Generalized anxiety disorder  Borderline personality disorder [facility])  Plan:   -meet again on Wednesday, December 07, 2024 at 1pm virtual

## 2024-12-07 ENCOUNTER — Ambulatory Visit: Payer: MEDICAID | Admitting: Professional

## 2024-12-07 ENCOUNTER — Encounter: Payer: Self-pay | Admitting: Professional

## 2024-12-07 ENCOUNTER — Ambulatory Visit: Payer: MEDICAID | Admitting: Physician Assistant

## 2024-12-07 ENCOUNTER — Encounter: Payer: Self-pay | Admitting: Physician Assistant

## 2024-12-07 VITALS — BP 102/65 | HR 96 | Ht 67.0 in | Wt 176.0 lb

## 2024-12-07 DIAGNOSIS — Z7984 Long term (current) use of oral hypoglycemic drugs: Secondary | ICD-10-CM

## 2024-12-07 DIAGNOSIS — F319 Bipolar disorder, unspecified: Secondary | ICD-10-CM | POA: Diagnosis not present

## 2024-12-07 DIAGNOSIS — E1169 Type 2 diabetes mellitus with other specified complication: Secondary | ICD-10-CM | POA: Insufficient documentation

## 2024-12-07 DIAGNOSIS — F900 Attention-deficit hyperactivity disorder, predominantly inattentive type: Secondary | ICD-10-CM | POA: Diagnosis not present

## 2024-12-07 DIAGNOSIS — G8929 Other chronic pain: Secondary | ICD-10-CM

## 2024-12-07 DIAGNOSIS — M47816 Spondylosis without myelopathy or radiculopathy, lumbar region: Secondary | ICD-10-CM

## 2024-12-07 DIAGNOSIS — Z7985 Long-term (current) use of injectable non-insulin antidiabetic drugs: Secondary | ICD-10-CM | POA: Diagnosis not present

## 2024-12-07 DIAGNOSIS — G894 Chronic pain syndrome: Secondary | ICD-10-CM

## 2024-12-07 DIAGNOSIS — G6289 Other specified polyneuropathies: Secondary | ICD-10-CM

## 2024-12-07 DIAGNOSIS — E785 Hyperlipidemia, unspecified: Secondary | ICD-10-CM

## 2024-12-07 DIAGNOSIS — F411 Generalized anxiety disorder: Secondary | ICD-10-CM | POA: Diagnosis not present

## 2024-12-07 DIAGNOSIS — R809 Proteinuria, unspecified: Secondary | ICD-10-CM

## 2024-12-07 DIAGNOSIS — M25511 Pain in right shoulder: Secondary | ICD-10-CM

## 2024-12-07 LAB — POCT UA - MICROALBUMIN
Creatinine, POC: 50 mg/dL
Microalbumin Ur, POC: 30 mg/L

## 2024-12-07 LAB — POCT GLYCOSYLATED HEMOGLOBIN (HGB A1C): Hemoglobin A1C: 5.3 % (ref 4.0–5.6)

## 2024-12-07 MED ORDER — HYDROCODONE-ACETAMINOPHEN 10-325 MG PO TABS: 1.0000 | ORAL_TABLET | Freq: Two times a day (BID) | ORAL | 0 refills | Status: AC

## 2024-12-07 MED ORDER — PREGABALIN 200 MG PO CAPS: ORAL_CAPSULE | ORAL | 1 refills | Status: AC

## 2024-12-07 MED ORDER — OZEMPIC (1 MG/DOSE) 4 MG/3ML ~~LOC~~ SOPN: 1.0000 mg | PEN_INJECTOR | SUBCUTANEOUS | 0 refills | Status: AC

## 2024-12-07 MED ORDER — METFORMIN HCL 1000 MG PO TABS: 1000.0000 mg | ORAL_TABLET | Freq: Two times a day (BID) | ORAL | 0 refills | Status: AC

## 2024-12-07 NOTE — Progress Notes (Unsigned)
° °  Established Patient Office Visit  Subjective   Patient ID: Frances [name], female    DOB: [birth year]  Age: 48 y.o. MRN: [id number]  Chief Complaint  Patient presents with   Medical Management of Chronic Issues    HPI  {History (Optional):23778}  ROS    Objective:     BP 102/65   Pulse 96   Ht 5' 7 (1.702 m)   Wt 176 lb (79.8 kg)   SpO2 99%   BMI 27.57 kg/m  {Vitals History (Optional):23777}  Physical Exam   No results found for any visits on 12/07/24.  {Labs (Optional):23779}  The 10-year ASCVD risk score (Arnett DK, et al., 2019) is: 1.3%    Assessment & Plan:   Problem List Items Addressed This Visit       Unprioritized   Diabetes mellitus (HCC) - Primary   Relevant Orders   POCT HgB A1C    No follow-ups on file.    Tyiesha Brackney, PA-C

## 2024-12-07 NOTE — Progress Notes (Signed)
 [facility] Counselor/Therapist Progress Note  Patient ID: Frances [name], MRN: [id number],    Date: 12/07/2024  Time Spent: 59 minutes 101-2pm   Treatment Type: Individual Therapy  Risk Assessment: Danger to Self:  No Self-injurious Behavior: No Danger to Others: No  Subjective: This session was held via video teletherapy. The patient consented to video teletherapy and was located at her home room during this session. She is aware it is the responsibility of the patient to secure confidentiality on her end of the session. The provider was in a private home office for the duration of this session.    The patient arrived early for her online session.  Issues addressed: 1-homework -use of word safe -tried to use but did not use much -the word caused her to feel butterflies in her stomach 2-Christmas -feels more energy and less fatigue but realized that she had forgotten to take her pain meds -pt notices she feels happier this year than she did last year -she is playing Christmas music 3-three wishes a-I'd like to live in a nicer place -to be in a decent house but needs income -attorney applied for SSI for her and they are working on it -she currently lives in a 1960's mobile home and she stays most of the time  b-keep Ed around in some way, whether that be  a friendship or more than that c-I'd like to be in less pain 3-Ed -is quiet -difference in female/female communication  Treatment Plan Problems: Depression, Financial Stress, Grief /Loss Unresolved, Medical Issues Symptoms: A diagnosis of a chronic illness that is not life-threatening but necessitates changes in living. A long-term lack of discipline in money management that has led to excessive indebtedness. A feeling of low self-esteem and hopelessness that is associated with the lack of sufficient income to cover the cost of living. Loss of income due to unemployment. Strong emotional  response of sadness exhibited when losses are discussed. Serial losses in life (i.e., deaths, divorces, jobs) that led to depression and discouragement. Sad affect, social withdrawal, anxiety, loss of interest in activities, and low energy. History of chronic or recurrent depression for which the client has taken antidepressant medication, been hospitalized, had outpatient treatment, or had a course of electroconvulsive therapy. Poor concentration and indecisiveness. Sleeplessness or hypersomnia. Low self-esteem. Lack of energy. Diminished interest in or enjoyment of activities. Depressed or irritable mood. Avoidance of talking on anything more than a superficial level about the loss. Unresolved grief issues. Lack of appetite, weight loss, and/or insomnia as well as other depression signs that occurred since the loss. Decrease or loss of appetite. Goals: Accept emotional support from those who care, without pushing them away in anger. Achieve an inner strength to control personal impulses, cravings, and desires that directly or indirectly increase debt irresponsibly. Begin a healthy grieving process around the loss. Develop an awareness of how the avoidance of grieving has affected life and begin the healing process. Gain a new sense of self-worth in which the substance of one's value is not attached to the capacity to do things or own things that cost money. Live life to the fullest extent possible, even though remaining time may be limited. Recognize, accept, and cope with feelings of depression. Resolve financial crisis with a path to eliminate debt. Work through the grieving process and face with peace the reality of own death. Objectives target date for all objectives is 09/08/2025: Identify the losses or limitations that have been experienced due to the medical  condition. Identify feelings associated with the medical condition. Work with therapist to develop a plan for coping with  stress. Learn and implement skills for managing stress. Implement mindfulness techniques for relapse prevention. Identify and replace thoughts and beliefs that support depression. Learn and implement behavioral strategies to overcome depression. Identify and voice positives about the deceased loved one including previous positive experiences, positive characteristics, positive aspects of the relationship, and how these things may be remembered. Set financial goals and make budgetary decisions with partner, allowing for equal input and balanced control over financial matters. Write a budget that balances income with expenses. Identify priorities that should control how money is spent. Begin verbalizing feelings associated with the loss. Identify how avoiding dealing with loss has negatively impacted life. Identify what stages of grief have been experienced in the continuum of the grieving process. Describe history, symptoms, and treatment of the medical condition. Interventions: Review the client's budget as to reasonableness and completeness. Reinforce changes in managing money that reflect compromise, responsible planning, and respectful cooperation with the client's partner. Ask the client to list the priorities that he/she believes should give direction to how his/her money is spent; process those priorities. Review the client's spending history to discover what priorities and values have misdirected spending. Ask the client to list ways that avoidance of grieving has negatively impacted his/her life. Ask the client to discuss and/or list the positive aspects of and memories about his/her relationship with the lost loved one; reinforce the client's expression of positive memories and emotions (e.g., smiling, laughing); encourage the client to share these thoughts with supportive loved ones. Educate the client on the stages of the grieving process and answer any questions he/she may  have. Assist the client in identifying the stages of grief that he/she has experienced and which stage he/she is presently working through. Ask the client to bring pictures or mementos connected with his/her loss to a session and talk about them (or assign Creating a Patent Examiner in the Adult Psychotherapy Administrator, Arts by Jenniffer). Assist the client in identifying and expressing feelings connected with his/her loss. In a collaborative fashion, develop a therapeutic alliance while gathering a history of the condition, including symptoms, client's reactions to the diagnosis, treatments of the condition, and prognosis. Assist the client in developing a tailored coping action plan for preventing and/or managing identified stressful reactions using skills such as relaxation, exercise, cognitive reframing, and problem-solving. Conduct skills training, building upon effective coping strategies the client possesses, and teaching new skills tailored to the specific stressor. Assist the client in identifying, sorting through, and verbalizing the various feelings generated by his/her medical condition. Ask the client to list the changes, losses, or limitations that have resulted from the medical condition (or assign The Impact of My Illness in the Adult Psychotherapy Homework Planner by Jenniffer). Assign the client to self-monitor thoughts, feelings, and actions in daily journal (e.g., Negative Thoughts Trigger Negative Feelings in the Adult Psychotherapy Homework Planner by Jenniffer; Daily Record of Dysfunctional Thoughts in Cognitive Therapy of Depression by Almarie Candida Gentry and Shona); process the journal material to challenge depressive thinking patterns and replace them with reality-based thoughts. Assign behavioral experiments in which depressive automatic thoughts are treated as hypotheses/prediction, reality-based alternative hypotheses/prediction are generated, and both are tested against  the client's past, present, and/or future experiences. Assist the client in developing skills that increase the likelihood of deriving pleasure from behavioral activation (e.g., assertiveness skills, developing an exercise plan, less internal/more external focus, increased social  involvement); reinforce success. Use mindfulness meditation and cognitive therapy techniques to help the client learn to recognize and regulate the negative thought processes associated with depression and to change his/her relationship with these thoughts (see Mindfulness-Based Cognitive Therapy for Depression by Kriste Pouch, and Jil). Work to increase the client's new sense of well-being by building his/her personal strengths evident in their progress through therapy (or assign Acknowledging My Strengths and/or What Are My Good Qualities? in the Adult Psychotherapy Homework Planner by Jenniffer).  Diagnosis:Bipolar I disorder (HCC)  Attention deficit hyperactivity disorder (ADHD), predominantly inattentive type  Generalized anxiety disorder  Plan:  -research horseman and back problems and seek support -meet again on Monday, December 26, 2024 at [place]

## 2024-12-09 ENCOUNTER — Encounter: Payer: Self-pay | Admitting: Physician Assistant

## 2024-12-09 DIAGNOSIS — G8929 Other chronic pain: Secondary | ICD-10-CM | POA: Insufficient documentation

## 2024-12-10 LAB — TOXASSURE SELECT 13 (MW), URINE

## 2024-12-11 ENCOUNTER — Ambulatory Visit (HOSPITAL_BASED_OUTPATIENT_CLINIC_OR_DEPARTMENT_OTHER)
Admission: RE | Admit: 2024-12-11 | Discharge: 2024-12-11 | Disposition: A | Discharge status: Home / Self Care | Payer: MEDICAID | Source: Ambulatory Visit

## 2024-12-11 DIAGNOSIS — S46011A Strain of muscle(s) and tendon(s) of the rotator cuff of right shoulder, initial encounter: Secondary | ICD-10-CM | POA: Diagnosis present

## 2024-12-26 ENCOUNTER — Ambulatory Visit (INDEPENDENT_AMBULATORY_CARE_PROVIDER_SITE_OTHER): Payer: MEDICAID | Admitting: Professional

## 2024-12-26 ENCOUNTER — Encounter: Payer: Self-pay | Admitting: Professional

## 2024-12-26 ENCOUNTER — Ambulatory Visit: Payer: Self-pay

## 2024-12-26 DIAGNOSIS — F411 Generalized anxiety disorder: Secondary | ICD-10-CM

## 2024-12-26 DIAGNOSIS — F319 Bipolar disorder, unspecified: Secondary | ICD-10-CM | POA: Diagnosis not present

## 2024-12-26 DIAGNOSIS — F900 Attention-deficit hyperactivity disorder, predominantly inattentive type: Secondary | ICD-10-CM | POA: Diagnosis not present

## 2024-12-26 DIAGNOSIS — F603 Borderline personality disorder: Secondary | ICD-10-CM | POA: Diagnosis not present

## 2024-12-26 NOTE — Telephone Encounter (Signed)
 The patient is scheduled to see Frances [name] on 12/27/24 at 1130 for the following symptoms.

## 2024-12-26 NOTE — Telephone Encounter (Signed)
 FYI Only or Action Required?: FYI only for provider: appointment scheduled on 12/27/24.  Patient was last seen in primary care on 12/07/2024 by Antoniette Vermell CROME, PA-C.  Called Nurse Triage reporting Foot Pain.  Symptoms began NA.  Interventions attempted: Other: NA.  Symptoms are: unchanged.  Triage Disposition: Duplicate Contact Calls  Patient/caregiver understands and will follow disposition?: Yes  Upon calling patient she states that she already has an appointment scheduled. Does not have any other needs at this time. Appt is scheduled for 12/27/24.    Reason for Disposition  Caller has already spoken with the doctor (or NP/PA, pharmacist) and has no further questions.  Protocols used: No Contact or Duplicate Contact Call-A-AH  Copied from CRM [id number]. Topic: Clinical - Red Word Triage >> Dec 26, 2024 10:37 AM Frances [name] wrote: Red Word that prompted transfer to Nurse Triage: pain in foot and big toe on left side.    ----------------------------------------------------------------------- From previous Reason for Contact - Pink Word Triage: Pink Word triggered transfer to Nurse Triage. See Triage Message for details.   ----------------------------------------------------------------------- From previous Reason for Contact - Scheduling: Patient/patient representative is calling to schedule an appointment. Refer to attachments for appointment information.

## 2024-12-26 NOTE — Progress Notes (Signed)
 "  [facility] Counselor/Therapist Progress Note  Patient ID: Frances [name], MRN: [id number],    Date: 12/26/2024  Time Spent: 43 minutes [id number]   Treatment Type: Individual Therapy  Risk Assessment: Danger to Self:  No Self-injurious Behavior: No Danger to Others: No  Subjective: This session was held via video teletherapy. The patient consented to video teletherapy and was located at her home room during this session. She is aware it is the responsibility of the patient to secure confidentiality on her end of the session. The provider was in a private home office for the duration of this session.    The patient arrived early for her online session.  Issues addressed: 1-homework -research horseman and back problems and seek support 2-Christmas -it was good 3-secured enough funds -her friend Ronal hired her to help her clean up her place and was nothing real physical -she was paid well and she got a lot accomplished with her friend -her home is cluttered after moving several years ago from a larger place -she is covering a farm for about 7 days and has one day next week -she has a  4-Ed -I worked on Ed -she made some fake mistletoe and he kissed her three times -they watched how the [employer] Christmas -they spent New Year's Eve together eating pizza and hanging out -he told his mother they are dating or something when she asked -pt is comfortable with the day to day stuff 5-couple -need to define what they are  Treatment Plan Problems: Depression, Financial Stress, Grief /Loss Unresolved, Medical Issues Symptoms: A diagnosis of a chronic illness that is not life-threatening but necessitates changes in living. A long-term lack of discipline in money management that has led to excessive indebtedness. A feeling of low self-esteem and hopelessness that is associated with the lack of sufficient income to cover the cost of living. Loss of income due  to unemployment. Strong emotional response of sadness exhibited when losses are discussed. Serial losses in life (i.e., deaths, divorces, jobs) that led to depression and discouragement. Sad affect, social withdrawal, anxiety, loss of interest in activities, and low energy. History of chronic or recurrent depression for which the client has taken antidepressant medication, been hospitalized, had outpatient treatment, or had a course of electroconvulsive therapy. Poor concentration and indecisiveness. Sleeplessness or hypersomnia. Low self-esteem. Lack of energy. Diminished interest in or enjoyment of activities. Depressed or irritable mood. Avoidance of talking on anything more than a superficial level about the loss. Unresolved grief issues. Lack of appetite, weight loss, and/or insomnia as well as other depression signs that occurred since the loss. Decrease or loss of appetite. Goals: Accept emotional support from those who care, without pushing them away in anger. Achieve an inner strength to control personal impulses, cravings, and desires that directly or indirectly increase debt irresponsibly. Begin a healthy grieving process around the loss. Develop an awareness of how the avoidance of grieving has affected life and begin the healing process. Gain a new sense of self-worth in which the substance of one's value is not attached to the capacity to do things or own things that cost money. Live life to the fullest extent possible, even though remaining time may be limited. Recognize, accept, and cope with feelings of depression. Resolve financial crisis with a path to eliminate debt. Work through the grieving process and face with peace the reality of own death. Objectives target date for all objectives is 09/08/2025: Identify the losses or limitations that  have been experienced due to the medical condition. Identify feelings associated with the medical condition. Work with therapist to  develop a plan for coping with stress. Learn and implement skills for managing stress. Implement mindfulness techniques for relapse prevention. Identify and replace thoughts and beliefs that support depression. Learn and implement behavioral strategies to overcome depression. Identify and voice positives about the deceased loved one including previous positive experiences, positive characteristics, positive aspects of the relationship, and how these things may be remembered. Set financial goals and make budgetary decisions with partner, allowing for equal input and balanced control over financial matters. Write a budget that balances income with expenses. Identify priorities that should control how money is spent. Begin verbalizing feelings associated with the loss. Identify how avoiding dealing with loss has negatively impacted life. Identify what stages of grief have been experienced in the continuum of the grieving process. Describe history, symptoms, and treatment of the medical condition. Interventions: Review the client's budget as to reasonableness and completeness. Reinforce changes in managing money that reflect compromise, responsible planning, and respectful cooperation with the client's partner. Ask the client to list the priorities that he/she believes should give direction to how his/her money is spent; process those priorities. Review the client's spending history to discover what priorities and values have misdirected spending. Ask the client to list ways that avoidance of grieving has negatively impacted his/her life. Ask the client to discuss and/or list the positive aspects of and memories about his/her relationship with the lost loved one; reinforce the client's expression of positive memories and emotions (e.g., smiling, laughing); encourage the client to share these thoughts with supportive loved ones. Educate the client on the stages of the grieving process and answer any  questions he/she may have. Assist the client in identifying the stages of grief that he/she has experienced and which stage he/she is presently working through. Ask the client to bring pictures or mementos connected with his/her loss to a session and talk about them (or assign Creating a Patent Examiner in the Adult Psychotherapy Administrator, Arts by Jenniffer). Assist the client in identifying and expressing feelings connected with his/her loss. In a collaborative fashion, develop a therapeutic alliance while gathering a history of the condition, including symptoms, client's reactions to the diagnosis, treatments of the condition, and prognosis. Assist the client in developing a tailored coping action plan for preventing and/or managing identified stressful reactions using skills such as relaxation, exercise, cognitive reframing, and problem-solving. Conduct skills training, building upon effective coping strategies the client possesses, and teaching new skills tailored to the specific stressor. Assist the client in identifying, sorting through, and verbalizing the various feelings generated by his/her medical condition. Ask the client to list the changes, losses, or limitations that have resulted from the medical condition (or assign The Impact of My Illness in the Adult Psychotherapy Homework Planner by Jenniffer). Assign the client to self-monitor thoughts, feelings, and actions in daily journal (e.g., Negative Thoughts Trigger Negative Feelings in the Adult Psychotherapy Homework Planner by Jenniffer; Daily Record of Dysfunctional Thoughts in Cognitive Therapy of Depression by Almarie Candida Gentry and Shona); process the journal material to challenge depressive thinking patterns and replace them with reality-based thoughts. Assign behavioral experiments in which depressive automatic thoughts are treated as hypotheses/prediction, reality-based alternative hypotheses/prediction are generated, and both  are tested against the client's past, present, and/or future experiences. Assist the client in developing skills that increase the likelihood of deriving pleasure from behavioral activation (e.g., assertiveness skills, developing an exercise  plan, less internal/more external focus, increased social involvement); reinforce success. Use mindfulness meditation and cognitive therapy techniques to help the client learn to recognize and regulate the negative thought processes associated with depression and to change his/her relationship with these thoughts (see Mindfulness-Based Cognitive Therapy for Depression by Kriste Pouch, and Jil). Work to increase the client's new sense of well-being by building his/her personal strengths evident in their progress through therapy (or assign Acknowledging My Strengths and/or What Are My Good Qualities? in the Adult Psychotherapy Homework Planner by Jenniffer).  Diagnosis:Bipolar I disorder (HCC)  Generalized anxiety disorder  Attention deficit hyperactivity disorder (ADHD), predominantly inattentive type  Borderline personality disorder (HCC)  Plan:  -consider how lack of relationship with your father impairs your interactions with men -meet again on Thursday, January 05, 2025 at 11am in person   "

## 2024-12-27 ENCOUNTER — Ambulatory Visit: Payer: MEDICAID | Admitting: Physician Assistant

## 2024-12-27 ENCOUNTER — Encounter: Payer: Self-pay | Admitting: Physician Assistant

## 2024-12-27 ENCOUNTER — Ambulatory Visit: Payer: MEDICAID

## 2024-12-27 VITALS — BP 115/76 | HR 88 | Ht 67.0 in | Wt 176.0 lb

## 2024-12-27 DIAGNOSIS — M79672 Pain in left foot: Secondary | ICD-10-CM

## 2024-12-27 DIAGNOSIS — G894 Chronic pain syndrome: Secondary | ICD-10-CM

## 2024-12-27 DIAGNOSIS — G63 Polyneuropathy in diseases classified elsewhere: Secondary | ICD-10-CM

## 2024-12-27 DIAGNOSIS — Z1231 Encounter for screening mammogram for malignant neoplasm of breast: Secondary | ICD-10-CM | POA: Diagnosis not present

## 2024-12-27 DIAGNOSIS — M47816 Spondylosis without myelopathy or radiculopathy, lumbar region: Secondary | ICD-10-CM | POA: Diagnosis not present

## 2024-12-27 DIAGNOSIS — G8929 Other chronic pain: Secondary | ICD-10-CM | POA: Diagnosis not present

## 2024-12-27 MED ORDER — LIDOCAINE 5 % EX PTCH: 1.0000 | MEDICATED_PATCH | Freq: Two times a day (BID) | CUTANEOUS | 1 refills | Status: AC

## 2024-12-27 MED ORDER — METHYLPREDNISOLONE SODIUM SUCC 125 MG IJ SOLR
125.0000 mg | Freq: Once | INTRAMUSCULAR | Status: AC
  Administered 2024-12-27: 125 mg via INTRAMUSCULAR

## 2024-12-27 MED ORDER — KETOROLAC TROMETHAMINE 60 MG/2ML IM SOLN
60.0000 mg | Freq: Once | INTRAMUSCULAR | Status: AC
  Administered 2024-12-27: 60 mg via INTRAMUSCULAR

## 2024-12-27 NOTE — Progress Notes (Unsigned)
 "  Established Patient Office Visit  Subjective   Patient ID: Frances [name], female    DOB: 1976/08/04  Age: 49 y.o. MRN: [id number]  Chief Complaint  Patient presents with   Medical Management of Chronic Issues    Foot and back pain    HPI .SABRADiscussed the use of AI scribe software for clinical note transcription with the patient, who gave verbal consent to proceed.  History of Present Illness Frances [name] is a 49 year old female with neuropathy and rheumatoid arthritis who presents with left foot pain.  Left foot pain - Sharp, stingy pain localized around the toe of the left foot - Duration of several months with recent worsening - Pain intensity fluctuates, reaching up to 5 out of 10 - Pain is more pronounced at night - Exacerbated by wearing shoes - No worsening with walking or bearing weight - No radiation of pain - No numbness, swelling, redness, warmth, or recent trauma to the foot  Peripheral neuropathy - History of neuropathy in feet and hands - Currently taking maximum dose of Lyrica  and also gabapentin  for neuropathic pain  Pain management - Uses Voltaren  and oxycodone  for pain control - Voltaren  gel provides variable relief  Rheumatoid arthritis - History of rheumatoid arthritis - Has not seen rheumatologist recently due to rescheduled appointment  Chronic low back pain - request shot of toradol  in office today.  - managed by orthopedics  Diagnostic evaluation - No recent x-rays of the feet   ROS See HPI.    Objective:     BP 115/76   Pulse 88   Ht 5' 7 (1.702 m)   Wt 176 lb (79.8 kg)   SpO2 99%   BMI 27.57 kg/m  BP Readings from Last 3 Encounters:  12/27/24 115/76  12/07/24 102/65  11/21/24 118/80   Wt Readings from Last 3 Encounters:  12/27/24 176 lb (79.8 kg)  12/07/24 176 lb (79.8 kg)  11/21/24 173 lb (78.5 kg)      Physical Exam Musculoskeletal:     Comments: Left foot: no redness, swelling, bruising.  Tenderness to palpation over lateral great toe and down into lateral metatarsals.  Normal ROM of left foot.  Strength 5/5 of left foot.   Neurological:     Mental Status: She is alert.       The 10-year ASCVD risk score (Arnett DK, et al., 2019) is: 1.7%    Assessment & Plan:  SABRASABRAShaneil Yazdi was seen today for medical management of chronic issues.  Diagnoses and all orders for this visit:  Left foot pain -     DG Foot Complete Left; Future -     ketorolac  (TORADOL ) injection 60 mg -     methylPREDNISolone  sodium succinate (SOLU-MEDROL ) 125 mg/2 mL injection 125 mg  Chronic pain syndrome -     lidocaine  (LIDODERM ) 5 %; Place 1 patch onto the skin every 12 (twelve) hours. Remove & Discard patch within 12 hours or as directed by MD -     ketorolac  (TORADOL ) injection 60 mg -     methylPREDNISolone  sodium succinate (SOLU-MEDROL ) 125 mg/2 mL injection 125 mg  Lumbar spondylosis -     lidocaine  (LIDODERM ) 5 %; Place 1 patch onto the skin every 12 (twelve) hours. Remove & Discard patch within 12 hours or as directed by MD -     ketorolac  (TORADOL ) injection 60 mg -     methylPREDNISolone  sodium succinate (SOLU-MEDROL ) 125 mg/2 mL injection 125 mg  Visit for screening mammogram -     MM 3D SCREENING MAMMOGRAM BILATERAL BREAST  Polyneuropathy associated with underlying disease   Assessment & Plan Left foot pain with neuropathy and possible arthritis/tendinitis Chronic sharp, stinging pain exacerbated by shoe contact and movement. Neuropathy suspected; arthritis or tendinitis possible. Stress fracture unlikely. - Ordered x-ray of the left foot. - Recommended orthotics for support. - Suggested Voltaren  gel for pain relief. - Advised stretching exercises with resistance band. - Consider lidocaine  patches for pain management.  Chronic back pain with lumbar spondylosis and neuropathy Chronic pain managed with Lyrica , gabapentin , and oxycodone  under a pain contract. -  Administered Toradol  and Solumedrol injections today for rescue pain relief.   Rheumatoid arthritis Potential contributor to foot pain. No recent rheumatology follow-up.  Diabetes mellitus with polyneuropathy Diabetes with polyneuropathy managed with Lyrica  and gabapentin . Neuropathy symptoms include nocturnal foot pain.  General Health Maintenance Mammogram screening overdue. - Schedule mammogram screening.   Mairely Foxworth, PA-C  "

## 2024-12-27 NOTE — Patient Instructions (Addendum)
 Lidocaine  patches sent to use Consider orthotics Xray ordered of left foot Use voltaren  gel as needed over foot Wear good supportive shoes

## 2024-12-28 ENCOUNTER — Encounter: Payer: Self-pay | Admitting: Physician Assistant

## 2024-12-28 DIAGNOSIS — S46011A Strain of muscle(s) and tendon(s) of the rotator cuff of right shoulder, initial encounter: Secondary | ICD-10-CM

## 2024-12-28 DIAGNOSIS — M7541 Impingement syndrome of right shoulder: Secondary | ICD-10-CM

## 2024-12-28 DIAGNOSIS — G8929 Other chronic pain: Secondary | ICD-10-CM

## 2024-12-29 ENCOUNTER — Ambulatory Visit
Admit: 2024-12-29 | Discharge: 2024-12-30 | Payer: Medicaid (Managed Care) | Attending: Student in an Organized Health Care Education/Training Program | Primary: Student in an Organized Health Care Education/Training Program

## 2024-12-29 ENCOUNTER — Other Ambulatory Visit: Payer: Self-pay

## 2024-12-29 DIAGNOSIS — G8929 Other chronic pain: Secondary | ICD-10-CM

## 2024-12-29 DIAGNOSIS — M7541 Impingement syndrome of right shoulder: Secondary | ICD-10-CM

## 2024-12-29 DIAGNOSIS — S46011A Strain of muscle(s) and tendon(s) of the rotator cuff of right shoulder, initial encounter: Secondary | ICD-10-CM

## 2024-12-29 DIAGNOSIS — M797 Fibromyalgia: Principal | ICD-10-CM

## 2024-12-29 DIAGNOSIS — M1990 Unspecified osteoarthritis, unspecified site: Principal | ICD-10-CM

## 2025-01-02 ENCOUNTER — Other Ambulatory Visit (HOSPITAL_COMMUNITY): Payer: Self-pay

## 2025-01-02 ENCOUNTER — Telehealth: Payer: Self-pay | Admitting: Pharmacy Technician

## 2025-01-02 NOTE — Telephone Encounter (Signed)
 Pharmacy Patient Advocate Encounter  Received notification from [employer] [place] [employer] that Prior Authorization for Lidocaine  5% patches has been APPROVED from 01/02/2025 to 01/02/2026. Ran test claim, Copay is $4.00. This test claim was processed through [facility]- copay amounts may vary at other pharmacies due to pharmacy/plan contracts, or as the patient moves through the different stages of their insurance plan.   PA #/Case ID/Reference #: [id number]

## 2025-01-02 NOTE — Telephone Encounter (Signed)
 Pharmacy Patient Advocate Encounter   Received notification from [facility] KEY that prior authorization for Lidocaine  5% patches is required/requested.   Insurance verification completed.   The patient is insured through [place] [employer].   Per test claim: PA required; PA submitted to above mentioned insurance via Latent Key/confirmation #/EOC [facility] Status is pending

## 2025-01-03 ENCOUNTER — Ambulatory Visit: Payer: MEDICAID

## 2025-01-03 DIAGNOSIS — Z1231 Encounter for screening mammogram for malignant neoplasm of breast: Secondary | ICD-10-CM | POA: Diagnosis not present

## 2025-01-04 ENCOUNTER — Ambulatory Visit: Payer: Self-pay | Admitting: Physician Assistant

## 2025-01-04 NOTE — Progress Notes (Signed)
 No acute findings with xray of foot. Heel spur noted.

## 2025-01-04 NOTE — Progress Notes (Signed)
 Not sure why radiologist did not mention it but when I personally review films you can certainly see arthritis in multiple areas of the foot.

## 2025-01-05 ENCOUNTER — Encounter: Payer: Self-pay | Admitting: Physician Assistant

## 2025-01-05 ENCOUNTER — Ambulatory Visit (INDEPENDENT_AMBULATORY_CARE_PROVIDER_SITE_OTHER): Payer: MEDICAID | Admitting: Professional

## 2025-01-05 ENCOUNTER — Encounter: Payer: Self-pay | Admitting: Professional

## 2025-01-05 DIAGNOSIS — F411 Generalized anxiety disorder: Secondary | ICD-10-CM | POA: Diagnosis not present

## 2025-01-05 DIAGNOSIS — F900 Attention-deficit hyperactivity disorder, predominantly inattentive type: Secondary | ICD-10-CM

## 2025-01-05 DIAGNOSIS — F319 Bipolar disorder, unspecified: Secondary | ICD-10-CM

## 2025-01-05 DIAGNOSIS — F603 Borderline personality disorder: Secondary | ICD-10-CM

## 2025-01-05 NOTE — Progress Notes (Signed)
 "  [facility] Counselor/Therapist Progress Note  Patient ID: Frances [name], MRN: [id number],    Date: 01/05/2025  Time Spent: 61 minutes 1054-1155am   Treatment Type: Individual Therapy  Risk Assessment: Danger to Self:  No Self-injurious Behavior: No Danger to Others: No  Subjective: The patient arrived early for her in-person appt  Issues addressed: 1-homework- did not review -consider how lack of relationship with your father impairs your interactions with men 2-mood -pleasant and easily engaged -reports experiencing body pain (back, shoulder, and feet) 3-physical -she will be evaluated for rotator cuff surgery next week -she is working with PA Vermell Bologna to get foot pain under control 4-EMDR a-covered what is EMDR, The EMDR process, What is trauma, how trauma can manifest, how the brain responds to triffers, how the body responds, trauma vs normal memories, how th brain can health itself b-began timeline of life events Age 49 father came for birthday and gave her a big brown bear stuffed animal She had the bear for years and when they moved the pt chose to get rid of it but has no idea if it was in preparing for the move that she got rid of it or before that time Pt had 200 stuffed animals and got rid of most of them; the ones she has are packed away. Has autograph dog that has her grandmother's signature on it She played volleyball, ran track and rode horses She did not play volleyball after her freshman year because the coach was a jerk; friend had told her that if he knew the person from soccer if would help them get on the team, she was the better player, but other people made  Pt started riding lessons at age 35-13; her aunt's parents (who lived 3 hours away) is where she was able to begin riding She did ride lessons paid for by mother and she paid for shows; mom paid for her riding shows -she did not prefer her instructor, and she was not treated  well, and her mother did not like how she treated her -when pt left for school, she left the farm and never went back to ride there 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 A long-term lack of discipline in money management that has led to excessive indebtedness. A feeling of low self-esteem and hopelessness that is associated with the lack of sufficient income to cover the cost of living. Loss of income due to unemployment. Strong emotional response of sadness exhibited when losses are discussed. Serial losses in life (i.e., deaths, divorces, jobs) that led to depression and discouragement. Sad affect, social withdrawal, anxiety, loss of interest in activities, and low energy. History of chronic or recurrent depression for which the client has taken antidepressant medication, been hospitalized, had outpatient treatment, or had a course of electroconvulsive therapy. Poor concentration and indecisiveness. Sleeplessness or hypersomnia. Low self-esteem. Lack of energy. Diminished interest in or enjoyment of activities. Depressed or irritable mood. Avoidance of talking on anything more than a superficial level about the loss. Unresolved grief issues. Lack of appetite, weight loss, and/or insomnia as well as other depression signs that occurred since the loss. Decrease or loss of appetite. Goals: Accept emotional support from those who care, without pushing them away in anger. Achieve an inner strength  to control personal impulses, cravings, and desires that directly or indirectly increase debt irresponsibly. Begin a healthy grieving process around the loss. Develop an awareness of how the avoidance of grieving has affected life and begin the healing process. Gain  a new sense of self-worth in which the substance of one's value is not attached to the capacity to do things or own things that cost money. Live life to the fullest extent possible, even though remaining time may be limited. Recognize, accept, and cope with feelings of depression. Resolve financial crisis with a path to eliminate debt. Work through the grieving process and face with peace the reality of own death. Objectives target date for all objectives is 09/08/2025: Identify the losses or limitations that have been experienced due to the medical condition. Identify feelings associated with the medical condition. Work with therapist to develop a plan for coping with stress. Learn and implement skills for managing stress. Implement mindfulness techniques for relapse prevention. Identify and replace thoughts and beliefs that support depression. Learn and implement behavioral strategies to overcome depression. Identify and voice positives about the deceased loved one including previous positive experiences, positive characteristics, positive aspects of the relationship, and how these things may be remembered. Set financial goals and make budgetary decisions with partner, allowing for equal input and balanced control over financial matters. Write a budget that balances income with expenses. Identify priorities that should control how money is spent. Begin verbalizing feelings associated with the loss. Identify how avoiding dealing with loss has negatively impacted life. Identify what stages of grief have been experienced in the continuum of the grieving process. Describe history, symptoms, and treatment of the medical condition. Interventions: Review the client's budget as to reasonableness and completeness. Reinforce changes in managing money that reflect compromise, responsible planning, and respectful cooperation with the client's partner. Ask the client to list the priorities that he/she  believes should give direction to how his/her money is spent; process those priorities. Review the client's spending history to discover what priorities and values have misdirected spending. Ask the client to list ways that avoidance of grieving has negatively impacted his/her life. Ask the client to discuss and/or list the positive aspects of and memories about his/her relationship with the lost loved one; reinforce the client's expression of positive memories and emotions (e.g., smiling, laughing); encourage the client to share these thoughts with supportive loved ones. Educate the client on the stages of the grieving process and answer any questions he/she may have. Assist the client in identifying the stages of grief that he/she has experienced and which stage he/she is presently working through. Ask the client to bring pictures or mementos connected with his/her loss to a session and talk about them (or assign Creating a Patent Examiner in the Adult Psychotherapy Administrator, Arts by Jenniffer). Assist the client in identifying and expressing feelings connected with his/her loss. In a collaborative fashion, develop a therapeutic alliance while gathering a history of the condition, including symptoms, client's reactions to the diagnosis, treatments of the condition, and prognosis. Assist the client in developing a tailored coping action plan for preventing and/or managing identified stressful reactions using skills such as relaxation, exercise, cognitive reframing, and problem-solving. Conduct skills training, building upon effective coping strategies the client possesses, and teaching new skills tailored to the specific stressor. Assist the client in identifying, sorting through, and verbalizing the various feelings generated by his/her medical condition. Ask the client to list the changes, losses, or limitations that have resulted from the  medical condition (or assign The Impact of My Illness in  the Adult Psychotherapy Homework Planner by Jenniffer). Assign the client to self-monitor thoughts, feelings, and actions in daily journal (e.g., Negative Thoughts Trigger Negative Feelings in the Adult Psychotherapy Homework Planner by Jenniffer; Daily Record of Dysfunctional Thoughts in Cognitive Therapy of Depression by Almarie Candida Gentry and Shona); process the journal material to challenge depressive thinking patterns and replace them with reality-based thoughts. Assign behavioral experiments in which depressive automatic thoughts are treated as hypotheses/prediction, reality-based alternative hypotheses/prediction are generated, and both are tested against the client's past, present, and/or future experiences. Assist the client in developing skills that increase the likelihood of deriving pleasure from behavioral activation (e.g., assertiveness skills, developing an exercise plan, less internal/more external focus, increased social involvement); reinforce success. Use mindfulness meditation and cognitive therapy techniques to help the client learn to recognize and regulate the negative thought processes associated with depression and to change his/her relationship with these thoughts (see Mindfulness-Based Cognitive Therapy for Depression by Kriste Pouch, and Jil). Work to increase the client's new sense of well-being by building his/her personal strengths evident in their progress through therapy (or assign Acknowledging My Strengths and/or What Are My Good Qualities? in the Adult Psychotherapy Homework Planner by Jenniffer).  Diagnosis:Bipolar I disorder (HCC)  Generalized anxiety disorder  Attention deficit hyperactivity disorder (ADHD), predominantly inattentive type  Borderline personality disorder (HCC)  Plan:  -consider positive memories of her life -utilize coping skills to manage triggers -meet again on Friday, January 13, 2025 at 10am in person   "

## 2025-01-06 NOTE — Progress Notes (Signed)
 Normal mammogram. Follow up in 1 year.

## 2025-01-09 ENCOUNTER — Ambulatory Visit: Payer: MEDICAID

## 2025-01-09 ENCOUNTER — Other Ambulatory Visit: Payer: Self-pay

## 2025-01-09 VITALS — BP 100/68 | Ht 67.0 in | Wt 176.0 lb

## 2025-01-09 DIAGNOSIS — M79672 Pain in left foot: Secondary | ICD-10-CM

## 2025-01-09 DIAGNOSIS — M19072 Primary osteoarthritis, left ankle and foot: Secondary | ICD-10-CM

## 2025-01-09 NOTE — Progress Notes (Signed)
" ° °  Subjective:    Patient ID: Frances [name], female    DOB: 49 y.o., 08/17/1976   MRN: [id number]  Chief Complaint: Left foot pain  Discussed the use of AI scribe software for clinical note transcription with the patient, who gave verbal consent to proceed.  History of Present Illness Patient is a very pleasant 49 year old female with past medical history significant for type 2 diabetes, chronic fatigue syndrome, polyneuropathy, rheumatoid arthritis presenting for evaluation of left foot pain ongoing for the past year without known inciting event   Review of Pertinent Imaging: Three-view plain film radiographs obtained of the left foot on 12/27/2024 per my independent evaluation revealing largely well-preserved joint spaces throughout the midfoot.  There appears to be fusion of the middle phalanx and distal phalanx in the fifth digit.  No acute osseous abnormalities.  Mild degenerative changes noted in the PIPs and DIPs.  Mild osteophytic change noted throughout the midfoot.  Prominent plantar calcaneal spur and mild calcification/enthesophyte present at the distal insertion of the Achilles.    Objective:   Vitals:   01/09/25 1503  BP: 100/68   Left Foot (Compared to normal) Inspection: Mild swelling present over the medial aspect of the first MTP joint.  Small amount of erythema in this region as well.  Pes planus. + Too many toes sign.  Modest calcaneal valgus which corrects with weighted plantarflexion. Palpation: TTP - navicular, - base of 5th metatarsal, - tarsals, - metatarsals, - metatarsal heads, - TMT joints, + first MTP, + medial aspect of the first metatarsal approximately 1/2-2/3 down its length. AROM/PROM: Full plantarflexion, dorsiflexion, eversion, inversion Strength: 5/5 plantarflexion, 5/5 dorsiflexion, 5/5 eversion, 5/5 inversion Special tests: - Squeeze test - [place] foot rules  Limited ultrasound evaluation of the left foot: There is prominent effusion present  within the first MTP joint with what appear to be degenerative changes and scattered hyperechogenic foci present within the fluid itself. Small subcentimeter fluid collection present superficial to the first MTP joint on the medial side without Doppler uptake intraluminally. No significant degenerative changes noted throughout the remaining MTP joints of the foot. Mild degenerative changes noted in the midfoot. 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 Risks (including potential risk of bleeding, rare risk of tendon injury), benefits, and alternatives reviewed. The left great toe MTP joint was identified. Prepped with Chloraprep. Ethyl Chloride for anesthesia. Great toe held in traction and plantarflexed 30 degrees. Under sterile conditions, needle inserted perpendicularly and 20mg  Depo-Medrol  & 0.5cc of Mepivicaine 2% injected. No resistance met. No blood on aspiration. Decreased pain post-injection.     Assessment & Plan:   Assessment & Plan Frances [name]'s left great toe pain is most likely due to degenerative changes in the first MTP joint and midfoot.  It seems that based on fluid I visualized on ultrasound, better first MTP joint is the primary source of this, though.  This may be related to her underlying diagnosis of rheumatoid arthritis.  Gout possible but less likely.  Offered corticosteroid injection intra-articularly which patient agreed to.  Recommended stiff toe plate to minimize dorsiflexion for now, and follow-up if pain worsens/persists in the interim.  Can also consider orthotic correction with medial posting.  If this recurs in this particular joint, can consider aspiration and sending fluid for analysis.   "

## 2025-01-10 ENCOUNTER — Other Ambulatory Visit: Payer: Self-pay | Admitting: Physician Assistant

## 2025-01-11 ENCOUNTER — Telehealth (HOSPITAL_COMMUNITY): Payer: MEDICAID | Admitting: Psychiatry

## 2025-01-11 ENCOUNTER — Encounter (HOSPITAL_COMMUNITY): Payer: Self-pay | Admitting: Psychiatry

## 2025-01-11 DIAGNOSIS — F900 Attention-deficit hyperactivity disorder, predominantly inattentive type: Secondary | ICD-10-CM | POA: Diagnosis not present

## 2025-01-11 DIAGNOSIS — F319 Bipolar disorder, unspecified: Secondary | ICD-10-CM | POA: Diagnosis not present

## 2025-01-11 MED ORDER — ARIPIPRAZOLE 15 MG PO TABS: 15.0000 mg | ORAL_TABLET | Freq: Every day | ORAL | 3 refills | Status: AC

## 2025-01-11 MED ORDER — BUPROPION HCL ER (XL) 300 MG PO TB24: 300.0000 mg | ORAL_TABLET | Freq: Every day | ORAL | 3 refills | Status: AC

## 2025-01-11 NOTE — Progress Notes (Signed)
 BH MD/PA/NP OP Progress Note Virtual Visit via Video Note  I connected with Frances [name] on 01/11/25 at  3:00 PM EST by a video enabled telemedicine application and verified that I am speaking with the correct person using two identifiers.  Location: Patient: Home Provider: Clinic   I discussed the limitations of evaluation and management by telemedicine and the availability of in person appointments. The patient expressed understanding and agreed to proceed.  I provided 30 minutes of non-face-to-face time during this encounter.              01/11/2025 2:21 PM Frances [name]  MRN:  [id number]  Chief Complaint: I stopped buspar   HPI: 49 year old female seen today for follow up psychiatric evaluation. She has a psychiatric history of bipolar 1, borderline personality disorder, depression, and anxiety. She is currently managed on Abilify  15 mg nightly and Wellbutrin  300 mg daily, buspar  10 mg three times daily, and Cymbalta   60 mg twice daily (from rheumatologist). Today, patient notes that medications are effective in managing her symptoms.   Today the patient was well-groomed, pleasant, cooperative, engaged in eye contact, and engaged in conversation. She informed clinical research associate that she is discontinued BuSpar .  She informed clinical research associate that she felt that she no longer needed it as her mood, anxiety, depression will mail manage.  Today provider conducted GAD-7 and patient score a 0, at her last visit she scored a 3.  Provider conducted PHQ-9 and patient scored a 5, at her last visit she scored a 9.  She endorses adequate sleep and appetite.  Today she denies SI/HI/AVH, mania, paranoia.    Patient informed clinical research associate that she continues to have chronic pain. She recently got a pain management injection in her foot and is hopeful that se will get injection in her back soon. She finds Cymbalta  somewhat effective.  She quantifies her pain today is 5 out of 10.   At this time she notes that  she does not wish to restart BuSpar .  She will continue her other medications as prescribed.  No other concerns noted at this time.      Visit Diagnosis:  No diagnosis found.         Past Psychiatric History: Bipolar, anxiety, and depression Past Medical History:  Past Medical History:  Diagnosis Date   Anxiety    Arthritis    Back pain, chronic    Bipolar 1 disorder (HCC)    Chronic fatigue syndrome    DDD (degenerative disc disease), lumbar    Depression    Diabetes mellitus without complication (HCC)    Fibromyalgia    IUD 2012   Mirena   MTHFR gene mutation    Rheumatoid arteritis (HCC)     Past Surgical History:  Procedure Laterality Date   BREAST REDUCTION SURGERY Bilateral 01/07/2024   Procedure: Bilateral breast reduction with liposuction;  Surgeon: Lowery Estefana RAMAN, DO;  Location: [facility];  Service: Plastics;  Laterality: Bilateral;   COLONOSCOPY WITH PROPOFOL  N/A 02/25/2017   Procedure: COLONOSCOPY WITH PROPOFOL ;  Surgeon: Elsie Cree, MD;  Location: [facility];  Service: Endoscopy;  Laterality: N/A;   left knee lateral release  1993   scar tisue removal  03/2005   left ankle   TONSILLECTOMY  age 26's    Family Psychiatric History: Maternal aunts Bipolar disorder and uncle alcoholic  Family History:  Family History  Problem Relation Age of Onset   Diabetes Mother    Stroke Mother  Lupus Mother    Hypertension Mother    Congestive Heart Failure Mother    Mental illness Brother        Not clear what his diagnosis is--may be related to previous drug use.   Cancer Maternal Grandmother        colon   Diabetes Maternal Grandfather    Depression Maternal Uncle    Alcohol abuse Maternal Uncle     Social History:  Social History   Socioeconomic History   Marital status: Single    Spouse name: Not on file   Number of children: 0   Years of education: Not on file   Highest education level: Bachelor's degree (e.g., BA, AB,  BS)  Occupational History   Occupation: Airline pilot at times  Tobacco Use   Smoking status: Never   Smokeless tobacco: Never  Vaping Use   Vaping status: Never Used  Substance and Sexual Activity   Alcohol use: No    Alcohol/week: 0.0 standard drinks of alcohol   Drug use: No   Sexual activity: Not on file  Other Topics Concern   Not on file  Social History Narrative   Originally from [place] , outside of [place].    Moved here permanently 2012.   Intermittently worked on a farm.   Lives with many cats--3 plus fosters cats.    Single, lives alone in a one story home. Rarely drinks caffeine . 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   Social Drivers of Health   Tobacco Use: Low Risk (01/09/2025)   Patient History    Smoking Tobacco Use: Never    Smokeless Tobacco Use: Never    Passive Exposure: Not on file  Financial Resource Strain: Medium Risk (06/12/2023)   Overall Financial Resource Strain (CARDIA)    Difficulty of Paying Living Expenses: Somewhat hard  Food Insecurity: Food Insecurity Present (06/12/2023)   Hunger Vital Sign    Worried About Running Out of Food in the Last Year: Sometimes true    Ran Out of Food in the Last Year: Never true  Transportation Needs: No Transportation Needs (06/12/2023)   PRAPARE - Administrator, Civil Service (Medical): No    Lack of Transportation (Non-Medical): No  Physical Activity: Insufficiently Active (07/27/2023)   Exercise Vital Sign    Days of Exercise per Week: 3 days    Minutes of Exercise per Session: 30 min  Stress: No Stress Concern Present (06/12/2023)   [employer] of Occupational Health - Occupational Stress Questionnaire    Feeling of Stress : Only a little  Social Connections: Socially Isolated (06/12/2023)   Social Connection and Isolation Panel    Frequency of Communication with Friends and Family: Never    Frequency of Social Gatherings with Friends and Family: Twice a week     Attends Religious Services: Never    Database Administrator or Organizations: Yes    Attends Engineer, Structural: More than 4 times per year    Marital Status: Never married  Depression (PHQ2-9): Low Risk (12/27/2024)   Depression (PHQ2-9)    PHQ-2 Score: 4  Recent Concern: Depression (PHQ2-9) - Medium Risk (10/28/2024)   Depression (PHQ2-9)    PHQ-2 Score: 9  Alcohol Screen: Not on file  Housing: Medium Risk (06/12/2023)   Housing    Last Housing Risk Score: 1  Utilities: Not At Risk (07/27/2023)   AHC Utilities    Threatened with loss of utilities: No  Health Literacy: Adequate  Health Literacy (07/27/2023)   B1300 Health Literacy    Frequency of need for help with medical instructions: Never    Allergies:  Allergies  Allergen Reactions   Metaxalone Hives    Metabolic Disorder Labs: Lab Results  Component Value Date   HGBA1C 5.3 12/07/2024   MPG 103 04/14/2018   Lab Results  Component Value Date   PROLACTIN 16.4 09/02/2023   Lab Results  Component Value Date   CHOL 132 09/02/2023   TRIG 201 (H) 09/02/2023   HDL 39 (L) 09/02/2023   CHOLHDL 3.4 09/02/2023   LDLCALC 60 09/02/2023   LDLCALC 41 09/10/2022   Lab Results  Component Value Date   TSH 1.030 09/02/2023   TSH 0.866 11/21/2020    Therapeutic Level Labs: No results found for: LITHIUM No results found for: VALPROATE No results found for: CBMZ  Current Medications: Current Outpatient Medications  Medication Sig Dispense Refill   ARIPiprazole  (ABILIFY ) 15 MG tablet Take 1 tablet (15 mg total) by mouth daily. 30 tablet 3   atorvastatin  (LIPITOR) 20 MG tablet TAKE 1 TABLET BY MOUTH DAILY 90 tablet 0   Azelastine  HCl 137 MCG/SPRAY SOLN USE 2 SPRAYS IN ALTERNATE NOSTRILS TWICE A DAY 30 mL 0   blood glucose meter kit and supplies KIT Dispense based on patient and insurance preference. Use up to four times daily as directed. 1 each 0   buPROPion  (WELLBUTRIN  XL) 300 MG 24 hr tablet Take 1 tablet  (300 mg total) by mouth daily. 30 tablet 3   cholecalciferol (VITAMIN D3) 25 MCG (1000 UT) tablet Take 1,000 Units by mouth daily.     diclofenac  (VOLTAREN ) 75 MG EC tablet Take 1 tablet (75 mg total) by mouth 2 (two) times daily. 60 tablet 0   diclofenac  Sodium (VOLTAREN ) 1 % GEL Apply 2 g topically 4 (four) times daily. To affected joint. 100 g 11   DULoxetine  HCl 30 MG CSDR Take 90 mg by mouth daily.     fluticasone  (FLONASE ) 50 MCG/ACT nasal spray USE 2 SPRAYS IN EACH NOSTRIL DAILY 16 g 0   HYDROcodone -acetaminophen  (NORCO) 10-325 MG tablet Take 1 tablet by mouth every 12 (twelve) hours. 60 tablet 0   HYDROcodone -acetaminophen  (NORCO) 10-325 MG tablet Take 1 tablet by mouth every 12 (twelve) hours. 60 tablet 0   [START ON 02/05/2025] HYDROcodone -acetaminophen  (NORCO) 10-325 MG tablet Take 1 tablet by mouth every 12 (twelve) hours. 60 tablet 0   hydroxychloroquine (PLAQUENIL) 200 MG tablet Take 200 mg by mouth 2 (two) times daily. 200mg  twice daily on weekdays and 200mg  once daily on weekends as of 12/03/22 review     levocetirizine (XYZAL ) 5 MG tablet TAKE 1 TABLET BY MOUTH IN THE EVENING 34 tablet 1   lidocaine  (LIDODERM ) 5 % Place 1 patch onto the skin every 12 (twelve) hours. Remove & Discard patch within 12 hours or as directed by MD 30 patch 1   metFORMIN  (GLUCOPHAGE ) 1000 MG tablet Take 1 tablet (1,000 mg total) by mouth 2 (two) times daily with a meal. 180 tablet 0   montelukast  (SINGULAIR ) 10 MG tablet TAKE 1 TABLET BY MOUTH AT BEDTIME 90 tablet 3   pregabalin  (LYRICA ) 200 MG capsule TAKE 1 CAPSULE BY MOUTH 2-3 TIMES DAILY 270 capsule 1   Semaglutide , 1 MG/DOSE, (OZEMPIC , 1 MG/DOSE,) 4 MG/3ML SOPN Inject 1 mg into the skin once a week. 9 mL 0   solifenacin  (VESICARE ) 10 MG tablet Take 1 tablet (10 mg total) by mouth daily. 30  tablet 11   tamsulosin  (FLOMAX ) 0.4 MG CAPS capsule Take 1 capsule (0.4 mg total) by mouth daily. 90 capsule 1   tiZANidine  (ZANAFLEX ) 4 MG tablet TAKE 1 TABLET BY  MOUTH EVERY 6 HOURS AS NEEDED FOR MUSCLE SPASMS 90 tablet 0   No current facility-administered medications for this visit.     Musculoskeletal: Strength & Muscle Tone: within normal limits and  telehealth visit Gait & Station: normal, telehealth visit Patient leans: N/A  Psychiatric Specialty Exam:   There were no vitals taken for this visit.There is no height or weight on file to calculate BMI.  General Appearance: Well Groomed  Eye Contact:  Good  Speech:  Clear and Coherent and Normal Rate  Volume:  Normal  Mood:  Euthymic  Affect:  Congruent  Thought Process:  Coherent, Goal Directed and Linear  Orientation:  Full (Time, Place, and Person)  Thought Content: WDL and Logical   Suicidal Thoughts:  No  Homicidal Thoughts:  No  Memory:  Immediate;   Good Recent;   Good Remote;   Good  Judgement:  Good  Insight:  Good  Psychomotor Activity:  Normal  Concentration:  Concentration: Good and Attention Span: Good  Recall:  Good  Fund of Knowledge: Good  Language: Good  Akathisia:  No  Handed:  Right  AIMS (if indicated): not done  Assets:  Communication Skills Desire for Improvement Financial Resources/Insurance Housing Social Support  ADL's:  Intact  Cognition: WNL  Sleep:  Good   Screenings: AIMS    Flowsheet Row Clinical Support from 06/30/2023 in [facility]  AIMS Total Score 5 0   AUDIT    Flowsheet Row [facility]  Alcohol Use Disorder Identification Test Final Score (AUDIT) 0   GAD-7    Flowsheet Row Office Visit from 12/27/2024 in [facility] Video Visit from 10/28/2024 in [facility] Video Visit from 06/02/2024 in [facility] Video Visit from 04/13/2024 in  [facility] Video Visit from 01/21/2024 in [facility]  Total GAD-7 Score 4 3 8 4 1    PHQ2-9    Flowsheet Row Office Visit from 12/27/2024 in [facility] at [facility] Video Visit from 10/28/2024 in [facility] Counselor from 10/21/2024 in [facility] at [facility] Office Visit from 06/07/2024 in [facility] Medicine at [facility] Video Visit from 06/02/2024 in [facility]  PHQ-2 Total Score 2 3 3  0 3  PHQ-9 Total Score 4 9 13  -- 11   Flowsheet Row UC from 09/13/2024 in [facility] [place] UC from 07/09/2024 in [facility] Video Visit from 06/02/2024 in [facility]  C-SSRS RISK CATEGORY No Risk No Risk Error: Q7 should not be populated when Q6 is No     Assessment and Plan: Patient notes that her anxiety and depression are well-managed.  At this time she notes that she does not wish to restart BuSpar .  She will continue her other medications as prescribed.    1. Bipolar I disorder (HCC)  Continue- ARIPiprazole  (ABILIFY ) 15 MG tablet; Take 1 tablet (15 mg total) by mouth daily.  Dispense: 30  tablet; Refill: 3 Continue- buPROPion  (WELLBUTRIN  XL) 300 MG 24 hr tablet; Take 1 tablet (300 mg total) by mouth daily.  Dispense: 30 tablet; Refill: 3  2. Attention deficit hyperactivity disorder (ADHD), predominantly inattentive type  Continue- buPROPion  (WELLBUTRIN  XL) 300 MG 24 hr tablet; Take 1 tablet (300 mg total) by mouth daily.  Dispense: 30 tablet; Refill: 3    Follow up in 2.5 months Follow up with therapy   Zane FORBES Bach, NP 01/11/2025, 2:21 PM

## 2025-01-13 ENCOUNTER — Encounter: Payer: Self-pay | Admitting: Professional

## 2025-01-13 ENCOUNTER — Ambulatory Visit: Payer: MEDICAID | Admitting: Professional

## 2025-01-13 DIAGNOSIS — F319 Bipolar disorder, unspecified: Secondary | ICD-10-CM | POA: Diagnosis not present

## 2025-01-13 DIAGNOSIS — F900 Attention-deficit hyperactivity disorder, predominantly inattentive type: Secondary | ICD-10-CM

## 2025-01-13 DIAGNOSIS — F411 Generalized anxiety disorder: Secondary | ICD-10-CM | POA: Diagnosis not present

## 2025-01-13 DIAGNOSIS — F603 Borderline personality disorder: Secondary | ICD-10-CM | POA: Diagnosis not present

## 2025-01-13 NOTE — Progress Notes (Signed)
 "  [facility] Counselor/Therapist Progress Note  Patient ID: Frances [name], MRN: [id number],    Date: 01/13/2025  Time Spent: 49 minutes [id number]   Treatment Type: Individual Therapy  Risk Assessment: Danger to Self:  No Self-injurious Behavior: No Danger to Others: No  Subjective: The patient arrived early for her in-person appt  Issues addressed: 1-homework- jreviewed -consider positive memories of her life -utilize coping skills to manage triggers 2-mood -pleasant and easily engaged -reports experiencing body pain (back, shoulder, and feet) 3-physical -she will be evaluated for rotator cuff surgery next week -she is working with PA Vermell Bologna to get foot pain under control 4-EMDR a-she recalls her father's birthday but cannot recall people that she wants to remember b-pt cannot call any other traumatic memories c-initiated EMDR -Bilateral stimulation -container exercise   -container is a treasure chest with lock on it -restoration team -calm, peaceful place   -beach with horses, [place] f-outcome -pt notices she feels calmer -admits she could go to sleep -notices that her back pain has lessened as well  Treatment Plan Problems: Depression, Financial Stress, Grief /Loss Unresolved, Medical Issues Symptoms: A diagnosis of a chronic illness that is not life-threatening but necessitates changes in living. A long-term lack of discipline in money management that has led to excessive indebtedness. A feeling of low self-esteem and hopelessness that is associated with the lack of sufficient income to cover the cost of living. Loss of income due to unemployment. Strong emotional response of sadness exhibited when losses are discussed. Serial losses in life (i.e., deaths, divorces, jobs) that led to depression and discouragement. Sad affect, social withdrawal, anxiety, loss of interest in activities, and low energy. History of chronic or recurrent  depression for which the client has taken antidepressant medication, been hospitalized, had outpatient treatment, or had a course of electroconvulsive therapy. Poor concentration and indecisiveness. Sleeplessness or hypersomnia. Low self-esteem. Lack of energy. Diminished interest in or enjoyment of activities. Depressed or irritable mood. Avoidance of talking on anything more than a superficial level about the loss. Unresolved grief issues. Lack of appetite, weight loss, and/or insomnia as well as other depression signs that occurred since the loss. Decrease or loss of appetite. Goals: Accept emotional support from those who care, without pushing them away in anger. Achieve an inner strength to control personal impulses, cravings, and desires that directly or indirectly increase debt irresponsibly. Begin a healthy grieving process around the loss. Develop an awareness of how the avoidance of grieving has affected life and begin the healing process. Gain a new sense of self-worth in which the substance of one's value is not attached to the capacity to do things or own things that cost money. Live life to the fullest extent possible, even though remaining time may be limited. Recognize, accept, and cope with feelings of depression. Resolve financial crisis with a path to eliminate debt. Work through the grieving process and face with peace the reality of own death. Objectives target date for all objectives is 09/08/2025: Identify the losses or limitations that have been experienced due to the medical condition. Identify feelings associated with the medical condition. Work with therapist to develop a plan for coping with stress. Learn and implement skills for managing stress. Implement mindfulness techniques for relapse prevention. Identify and replace thoughts and beliefs that support depression. Learn and implement behavioral strategies to overcome depression. Identify and voice positives  about the deceased loved one including previous positive experiences, positive characteristics, positive aspects of the  relationship, and how these things may be remembered. Set financial goals and make budgetary decisions with partner, allowing for equal input and balanced control over financial matters. Write a budget that balances income with expenses. Identify priorities that should control how money is spent. Begin verbalizing feelings associated with the loss. Identify how avoiding dealing with loss has negatively impacted life. Identify what stages of grief have been experienced in the continuum of the grieving process. Describe history, symptoms, and treatment of the medical condition. Interventions: Review the client's budget as to reasonableness and completeness. Reinforce changes in managing money that reflect compromise, responsible planning, and respectful cooperation with the client's partner. Ask the client to list the priorities that he/she believes should give direction to how his/her money is spent; process those priorities. Review the client's spending history to discover what priorities and values have misdirected spending. Ask the client to list ways that avoidance of grieving has negatively impacted his/her life. Ask the client to discuss and/or list the positive aspects of and memories about his/her relationship with the lost loved one; reinforce the client's expression of positive memories and emotions (e.g., smiling, laughing); encourage the client to share these thoughts with supportive loved ones. Educate the client on the stages of the grieving process and answer any questions he/she may have. Assist the client in identifying the stages of grief that he/she has experienced and which stage he/she is presently working through. Ask the client to bring pictures or mementos connected with his/her loss to a session and talk about them (or assign Creating a Patent Examiner in  the Adult Psychotherapy Administrator, Arts by Jenniffer). Assist the client in identifying and expressing feelings connected with his/her loss. In a collaborative fashion, develop a therapeutic alliance while gathering a history of the condition, including symptoms, client's reactions to the diagnosis, treatments of the condition, and prognosis. Assist the client in developing a tailored coping action plan for preventing and/or managing identified stressful reactions using skills such as relaxation, exercise, cognitive reframing, and problem-solving. Conduct skills training, building upon effective coping strategies the client possesses, and teaching new skills tailored to the specific stressor. Assist the client in identifying, sorting through, and verbalizing the various feelings generated by his/her medical condition. Ask the client to list the changes, losses, or limitations that have resulted from the medical condition (or assign The Impact of My Illness in the Adult Psychotherapy Homework Planner by Jenniffer). Assign the client to self-monitor thoughts, feelings, and actions in daily journal (e.g., Negative Thoughts Trigger Negative Feelings in the Adult Psychotherapy Homework Planner by Jenniffer; Daily Record of Dysfunctional Thoughts in Cognitive Therapy of Depression by Almarie Candida Gentry and Shona); process the journal material to challenge depressive thinking patterns and replace them with reality-based thoughts. Assign behavioral experiments in which depressive automatic thoughts are treated as hypotheses/prediction, reality-based alternative hypotheses/prediction are generated, and both are tested against the client's past, present, and/or future experiences. Assist the client in developing skills that increase the likelihood of deriving pleasure from behavioral activation (e.g., assertiveness skills, developing an exercise plan, less internal/more external focus, increased social  involvement); reinforce success. Use mindfulness meditation and cognitive therapy techniques to help the client learn to recognize and regulate the negative thought processes associated with depression and to change his/her relationship with these thoughts (see Mindfulness-Based Cognitive Therapy for Depression by Kriste Pouch, and Jil). Work to increase the client's new sense of well-being by building his/her personal strengths evident in their progress through therapy (or assign Acknowledging My  Strengths and/or What Are My Good Qualities? in the Adult Psychotherapy Homework Planner by Jenniffer).  Diagnosis:Bipolar I disorder (HCC)  Generalized anxiety disorder  Attention deficit hyperactivity disorder (ADHD), predominantly inattentive type  Borderline personality disorder [facility])  Plan:  -meet again on Thursday, January 19, 2025 at 11am in person   "

## 2025-01-14 ENCOUNTER — Ambulatory Visit: Payer: MEDICAID

## 2025-01-14 ENCOUNTER — Ambulatory Visit
Admission: EM | Admit: 2025-01-14 | Discharge: 2025-01-14 | Disposition: A | Discharge status: Home / Self Care | Payer: MEDICAID | Attending: Internal Medicine | Admitting: Internal Medicine

## 2025-01-14 ENCOUNTER — Other Ambulatory Visit: Payer: Self-pay

## 2025-01-14 ENCOUNTER — Encounter: Payer: Self-pay | Admitting: Emergency Medicine

## 2025-01-14 DIAGNOSIS — M545 Low back pain, unspecified: Secondary | ICD-10-CM | POA: Diagnosis not present

## 2025-01-14 MED ORDER — PREDNISONE 10 MG (21) PO TBPK: ORAL_TABLET | Freq: Every day | ORAL | 0 refills | Status: AC

## 2025-01-14 MED ORDER — KETOROLAC TROMETHAMINE 30 MG/ML IJ SOLN
30.0000 mg | Freq: Once | INTRAMUSCULAR | Status: AC
  Administered 2025-01-14: 30 mg via INTRAMUSCULAR

## 2025-01-14 MED ORDER — CYCLOBENZAPRINE HCL 10 MG PO TABS: 10.0000 mg | ORAL_TABLET | Freq: Three times a day (TID) | ORAL | 0 refills | Status: DC | PRN

## 2025-01-14 NOTE — ED Provider Notes (Signed)
 " [facility]    CSN: [id number] Arrival date & time: 01/14/25  1414      History   Chief Complaint Chief Complaint  Patient presents with   Back Pain    HPI Frances [name] is a 49 y.o. female.   49 year old female who presents urgent care with complaints of right lower back pain.  She reports that she has a history of chronic back pain that she takes 10 mg of Norco and tizanidine  for.  She reports in the last several days however she has had a pain in her right lower back that is catching.  It is much more severe than her normal pain.  It is especially painful when she twist or when she is raising back up from leaning over.  She denies any dysuria, hematuria, fever, abdominal pain, diarrhea.  The pain is much worse with movements.  She has not had an injury to the area that she is aware of.  She denies any radiating pain into the buttocks or legs   Back Pain Associated symptoms: no abdominal pain, no chest pain, no dysuria and no fever     Past Medical History:  Diagnosis Date   Anxiety    Arthritis    Back pain, chronic    Bipolar 1 disorder (HCC)    Chronic fatigue syndrome    DDD (degenerative disc disease), lumbar    Depression    Diabetes mellitus without complication (HCC)    Fibromyalgia    IUD 2012   Mirena   MTHFR gene mutation    Rheumatoid arteritis (HCC)     Patient Active Problem List   Diagnosis Date Noted   Left foot pain 12/27/2024   Polyneuropathy associated with underlying disease 12/27/2024   Chronic right shoulder pain 12/09/2024   Hyperlipidemia associated with type 2 diabetes mellitus (HCC) 12/07/2024   Orthostatic hypotension 10/19/2024   Viral gastroenteritis 10/19/2024   Vision changes 10/19/2024   Paresthesia of both feet 09/21/2024   Pilar cyst of scalp 09/09/2024   Dizziness 09/09/2024   Degeneration of intervertebral disc of lumbar region with discogenic back pain and lower extremity pain 06/08/2024   Urinary  retention 06/07/2024   Carpal tunnel syndrome, bilateral 02/25/2024   Pain in left third proximal interphalangeal joint 10/16/2023   Personal history UTI 09/14/2023   Rheumatoid arthritis 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 (HCC) 09/14/2023   Urinary hesitancy 06/30/2023   Urinary frequency 06/16/2023   OAB (overactive bladder) 06/16/2023   Chronic pain syndrome 06/10/2023   Crush injury of right foot 05/25/2023   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   Microalbuminuria 09/09/2022   Night sweats 06/18/2022   Proteinuria 03/17/2022   Chronic right hip pain 01/06/2022   Impingement syndrome, shoulder, right 01/06/2022   ETD (Eustachian tube dysfunction), bilateral 08/23/2021   SOB (shortness of breath) on exertion 08/20/2021   Overweight (BMI 25.0-29.9) 06/18/2021   Attention deficit hyperactivity disorder (ADHD), 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   Hyperlipidemia LDL goal <70 03/18/2021   Diabetes mellitus (HCC) 03/13/2021   Seasonal allergies 12/25/2020   Symptomatic mammary hypertrophy 09/04/2020   Recurrent major depressive disorder, in partial remission 05/24/2020   Generalized anxiety disorder 05/24/2020   DDD (degenerative disc disease), cervical 03/13/2020   Ingrown right big toenail 03/13/2020   Labral tear of right hip joint, degenerative 01/11/2020   Grief reaction 11/28/2019   Borderline personality disorder (HCC) 01/22/2018   Seronegative rheumatoid arthritis (HCC) 12/09/2017   New daily persistent  headache 09/26/2017   MTHFR gene mutation 09/20/2017   Bipolar I disorder (HCC) 09/16/2017   Chronic fatigue syndrome 09/16/2017   Back pain 10/13/2008   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   Lumbar spondylosis 02/10/2008   FIBROMYALGIA 02/10/2008   Other specified abnormal findings of blood chemistry 02/10/2008    Past Surgical History:  Procedure Laterality Date   BREAST REDUCTION SURGERY Bilateral 01/07/2024   Procedure: Bilateral breast  reduction with liposuction;  Surgeon: Lowery Estefana RAMAN, DO;  Location: [facility];  Service: Plastics;  Laterality: Bilateral;   COLONOSCOPY WITH PROPOFOL  N/A 02/25/2017   Procedure: COLONOSCOPY WITH PROPOFOL ;  Surgeon: Elsie Cree, MD;  Location: [facility];  Service: Endoscopy;  Laterality: N/A;   left knee lateral release  1993   scar tisue removal  03/2005   left ankle   TONSILLECTOMY  age 36's    OB History     Gravida  0   Para  0   Term  0   Preterm  0   AB  0   Living  0      SAB  0   IAB  0   Ectopic  0   Multiple  0   Live Births               Home Medications    Prior to Admission medications  Medication Sig Start Date End Date Taking? Authorizing Provider  ARIPiprazole  (ABILIFY ) 15 MG tablet Take 1 tablet (15 mg total) by mouth daily. 01/11/25  Yes Harl Regan E, NP  atorvastatin  (LIPITOR) 20 MG tablet TAKE 1 TABLET BY MOUTH DAILY 06/23/24  Yes Breeback, Jade L, PA-C  Azelastine  HCl 137 MCG/SPRAY SOLN USE 2 SPRAYS IN ALTERNATE NOSTRILS TWICE A DAY 11/15/24  Yes Breeback, Jade L, PA-C  blood glucose meter kit and supplies KIT Dispense based on patient and insurance preference. Use up to four times daily as directed. 05/29/21  Yes Breeback, Jade L, PA-C  buPROPion  (WELLBUTRIN  XL) 300 MG 24 hr tablet Take 1 tablet (300 mg total) by mouth daily. 01/11/25  Yes Harl Regan E, NP  cholecalciferol (VITAMIN D3) 25 MCG (1000 UT) tablet Take 1,000 Units by mouth daily.   Yes [provider]  diclofenac  (VOLTAREN ) 75 MG EC tablet Take 1 tablet (75 mg total) by mouth 2 (two) times daily. 10/19/22  Yes Maranda Jamee Jacob, MD  DULoxetine  HCl 30 MG CSDR Take 90 mg by mouth daily.   Yes [provider]  fluticasone  (FLONASE ) 50 MCG/ACT nasal spray USE 2 SPRAYS IN EACH NOSTRIL DAILY 09/23/24  Yes Breeback, Jade L, PA-C  HYDROcodone -acetaminophen  (NORCO) 10-325 MG tablet Take 1 tablet by mouth every 12 (twelve) hours. 12/07/24   Yes Breeback, Jade L, PA-C  HYDROcodone -acetaminophen  (NORCO) 10-325 MG tablet Take 1 tablet by mouth every 12 (twelve) hours. 01/06/25  Yes Breeback, Jade L, PA-C  HYDROcodone -acetaminophen  (NORCO) 10-325 MG tablet Take 1 tablet by mouth every 12 (twelve) hours. 02/05/25  Yes Breeback, Jade L, PA-C  hydroxychloroquine (PLAQUENIL) 200 MG tablet Take 200 mg by mouth 2 (two) times daily. 200mg  twice daily on weekdays and 200mg  once daily on weekends as of 12/03/22 review   Yes [provider]  levocetirizine (XYZAL ) 5 MG tablet TAKE 1 TABLET BY MOUTH IN THE EVENING 10/21/24  Yes Breeback, Jade L, PA-C  lidocaine  (LIDODERM ) 5 % Place 1 patch onto the skin every 12 (twelve) hours. Remove & Discard patch within 12 hours or as directed by MD  12/27/24  Yes Breeback, Jade L, PA-C  metFORMIN  (GLUCOPHAGE ) 1000 MG tablet Take 1 tablet (1,000 mg total) by mouth 2 (two) times daily with a meal. 12/07/24  Yes Breeback, Jade L, PA-C  montelukast  (SINGULAIR ) 10 MG tablet TAKE 1 TABLET BY MOUTH AT BEDTIME 08/12/24  Yes Breeback, Jade L, PA-C  pregabalin  (LYRICA ) 200 MG capsule TAKE 1 CAPSULE BY MOUTH 2-3 TIMES DAILY 12/07/24  Yes Breeback, Jade L, PA-C  Semaglutide , 1 MG/DOSE, (OZEMPIC , 1 MG/DOSE,) 4 MG/3ML SOPN Inject 1 mg into the skin once a week. 12/07/24  Yes Breeback, Jade L, PA-C  tiZANidine  (ZANAFLEX ) 4 MG tablet TAKE 1 TABLET BY MOUTH EVERY 6 HOURS AS NEEDED FOR MUSCLE SPASMS 01/10/25  Yes Breeback, Jade L, PA-C  cyclobenzaprine  (FLEXERIL ) 10 MG tablet Take 1 tablet (10 mg total) by mouth 3 (three) times daily as needed for muscle spasms. 01/14/25  Yes Dan Dissinger A, PA-C  diclofenac  Sodium (VOLTAREN ) 1 % GEL Apply 2 g topically 4 (four) times daily. To affected joint. 10/16/23   Curtis Debby PARAS, MD  predniSONE  (STERAPRED UNI-PAK 21 TAB) 10 MG (21) TBPK tablet Take by mouth daily. Take 6 tabs by mouth daily  for 2 days, then 5 tabs for 2 days, then 4 tabs for 2 days, then 3 tabs for 2 days, 2  tabs for 2 days, then 1 tab by mouth daily for 2 days 01/14/25  Yes Shakinah Navis A, PA-C  solifenacin  (VESICARE ) 10 MG tablet Take 1 tablet (10 mg total) by mouth daily. 08/13/23   Stoneking, Adine PARAS., MD  tamsulosin  (FLOMAX ) 0.4 MG CAPS capsule Take 1 capsule (0.4 mg total) by mouth daily. 06/07/24   Antoniette Vermell CROME, PA-C    Family History Family History  Problem Relation Age of Onset   Diabetes Mother    Stroke Mother    Lupus Mother    Hypertension Mother    Congestive Heart Failure Mother    Mental illness Brother        Not clear what his diagnosis is--may be related to previous drug use.   Cancer Maternal Grandmother        colon   Diabetes Maternal Grandfather    Depression Maternal Uncle    Alcohol abuse Maternal Uncle     Social History Social History[1]   Allergies   Metaxalone   Review of Systems Review of Systems  Constitutional:  Negative for chills and fever.  HENT:  Negative for ear pain and sore throat.   Eyes:  Negative for pain and visual disturbance.  Respiratory:  Negative for cough and shortness of breath.   Cardiovascular:  Negative for chest pain and palpitations.  Gastrointestinal:  Negative for abdominal pain and vomiting.  Genitourinary:  Negative for dysuria and hematuria.  Musculoskeletal:  Positive for back pain. Negative for arthralgias.  Skin:  Negative for color change and rash.  Neurological:  Negative for seizures and syncope.  All other systems reviewed and are negative.    Physical Exam Triage Vital Signs ED Triage Vitals  Encounter Vitals Group     BP 01/14/25 1457 112/75     Girls Systolic BP Percentile --      Girls Diastolic BP Percentile --      Boys Systolic BP Percentile --      Boys Diastolic BP Percentile --      Pulse Rate 01/14/25 1457 77     Resp 01/14/25 1457 18     Temp 01/14/25 1457 99.2 F (37.3  C)     Temp Source 01/14/25 1457 Oral     SpO2 01/14/25 1457 99 %     Weight 01/14/25 1455 173 lb (78.5 kg)      Height 01/14/25 1455 5' 7 (1.702 m)     Head Circumference --      Peak Flow --      Pain Score 01/14/25 1455 5     Pain Loc --      Pain Education --      Exclude from Growth Chart --    No data found.  Updated Vital Signs BP 112/75 (BP Location: Right Arm)   Pulse 77   Temp 99.2 F (37.3 C) (Oral)   Resp 18   Ht 5' 7 (1.702 m)   Wt 173 lb (78.5 kg)   SpO2 99%   BMI 27.10 kg/m   Visual Acuity Right Eye Distance:   Left Eye Distance:   Bilateral Distance:    Right Eye Near:   Left Eye Near:    Bilateral Near:     Physical Exam Vitals and nursing note reviewed.  Constitutional:      General: She is not in acute distress.    Appearance: She is well-developed.  HENT:     Head: Normocephalic and atraumatic.  Eyes:     Conjunctiva/sclera: Conjunctivae normal.  Cardiovascular:     Rate and Rhythm: Normal rate and regular rhythm.     Heart sounds: No murmur heard. Pulmonary:     Effort: Pulmonary effort is normal. No respiratory distress.     Breath sounds: Normal breath sounds.  Abdominal:     Palpations: Abdomen is soft.     Tenderness: There is no abdominal tenderness.  Musculoskeletal:        General: No swelling.     Cervical back: Neck supple.     Lumbar back: Spasms and tenderness present. Decreased range of motion. Negative right straight leg raise test and negative left straight leg raise test.       Back:  Skin:    General: Skin is warm and dry.     Capillary Refill: Capillary refill takes less than 2 seconds.  Neurological:     Mental Status: She is alert.  Psychiatric:        Mood and Affect: Mood normal.      UC Treatments / Results  Labs (all labs ordered are listed, but only abnormal results are displayed) Labs Reviewed - No data to display  EKG   Radiology DG Lumbar Spine Complete Result Date: 01/14/2025 EXAM: 4 VIEW(S) XRAY OF THE LUMBAR SPINE 01/14/2025 03:27:00 PM COMPARISON: MRI lumbar spine 05/07/2024. CLINICAL HISTORY:  Right lower back pain. Worsening right lower back pain over the last few weeks. No known injury. FINDINGS: LUMBAR SPINE: BONES: 5 lumbar-type vertebral bodies. Vertebral body heights are maintained. Alignment is normal. Normal alignment of the facet joints. No vertebral compression deformities. The visualized sacrum appears intact. DISCS AND DEGENERATIVE CHANGES: Mild degenerative changes throughout the lumbar spine with disc space narrowing and endplate osteophyte formation. Degenerative changes are most prominent at L4-L5, where there is near complete loss of disc space and mild retrolisthesis of L4 on L5. This is unchanged since the prior study. SOFT TISSUES: Mild vascular calcifications. No acute abnormality. IMPRESSION: 1. No acute osseous abnormality of the lumbar spine. 2. Mild degenerative changes throughout the lumbar spine, most prominent at L4-L5 with near complete loss of disc space and mild retrolisthesis of L4 on L5, unchanged  since the prior study. Electronically signed by: Elsie Gravely MD 01/14/2025 03:39 PM EST RP Workstation: [id number]    Procedures Procedures (including critical care time)  Medications Ordered in UC Medications  ketorolac  (TORADOL ) 30 MG/ML injection 30 mg (30 mg Intramuscular Given 01/14/25 1535)    Initial Impression / Assessment and Plan / UC Course  I have reviewed the triage vital signs and the nursing notes.  Pertinent labs & imaging results that were available during my care of the patient were reviewed by me and considered in my medical decision making (see chart for details).     Acute right-sided low back pain without sciatica - Plan: DG Lumbar Spine Complete, DG Lumbar Spine Complete   X-ray of the back done today.  Final evaluation by the radiologist does not show any acute findings although there is degenerative changes that are unchanged from previous x-ray.  Symptoms and physical exam findings are most consistent with muscle spasm and muscle  strain in the back.  We recommend the following: Hold tizanidine  and start Flexeril  10 mg every 8 hours as needed for muscle spasms.  Use caution as this medication can cause drowsiness. Hold diclofenac  and start Prednisone  taper, 60mg  (6 pills) on day 1 and day 2, 50mg  (5 pills) on day 3 and 4, 40mg  (4 pills) on day 5 and 6, 30mg  (3 pills) on day 7 and 8 and 20mg  (2 pill) on day 9 and day 10, 10mg  (1 pill) on day 11 and day 12  Light stretching to improve mobility. May alternate heat and ice to help with symptoms.  Make sure to stay hydrated by drinking plenty of water. Return to urgent care or PCP if symptoms worsen or fail to resolve.    Final Clinical Impressions(s) / UC Diagnoses   Final diagnoses:  Acute right-sided low back pain without sciatica     Discharge Instructions      X-ray of the back done today.  Final evaluation by the radiologist does not show any acute findings although there is degenerative changes that are unchanged from previous x-ray.  Symptoms and physical exam findings are most consistent with muscle spasm and muscle strain in the back.  We recommend the following: Hold tizanidine  and start Flexeril  10 mg every 8 hours as needed for muscle spasms.  Use caution as this medication can cause drowsiness. Hold diclofenac  and start Prednisone  taper, 60mg  (6 pills) on day 1 and day 2, 50mg  (5 pills) on day 3 and 4, 40mg  (4 pills) on day 5 and 6, 30mg  (3 pills) on day 7 and 8 and 20mg  (2 pill) on day 9 and day 10, 10mg  (1 pill) on day 11 and day 12  Light stretching to improve mobility. May alternate heat and ice to help with symptoms.  Make sure to stay hydrated by drinking plenty of water. Return to urgent care or PCP if symptoms worsen or fail to resolve.       ED Prescriptions     Medication Sig Dispense Auth. Provider   cyclobenzaprine  (FLEXERIL ) 10 MG tablet Take 1 tablet (10 mg total) by mouth 3 (three) times daily as needed for muscle spasms. 20 tablet Lakeitha Basques,  Mishael Haran A, PA-C   predniSONE  (STERAPRED UNI-PAK 21 TAB) 10 MG (21) TBPK tablet Take by mouth daily. Take 6 tabs by mouth daily  for 2 days, then 5 tabs for 2 days, then 4 tabs for 2 days, then 3 tabs for 2 days, 2 tabs for 2 days, then  1 tab by mouth daily for 2 days 42 tablet Silvester Reierson, Almarie LABOR, PA-C      PDMP not reviewed this encounter.    [1]  Social History Tobacco Use   Smoking status: Never   Smokeless tobacco: Never  Vaping Use   Vaping status: Never Used  Substance Use Topics   Alcohol use: No    Alcohol/week: 0.0 standard drinks of alcohol   Drug use: No     Teresa Almarie LABOR, PA-C 01/14/25 1557  "

## 2025-01-14 NOTE — Discharge Instructions (Addendum)
 X-ray of the back done today.  Final evaluation by the radiologist does not show any acute findings although there is degenerative changes that are unchanged from previous x-ray.  Symptoms and physical exam findings are most consistent with muscle spasm and muscle strain in the back.  We recommend the following: Hold tizanidine  and start Flexeril  10 mg every 8 hours as needed for muscle spasms.  Use caution as this medication can cause drowsiness. Hold diclofenac  and start Prednisone  taper, 60mg  (6 pills) on day 1 and day 2, 50mg  (5 pills) on day 3 and 4, 40mg  (4 pills) on day 5 and 6, 30mg  (3 pills) on day 7 and 8 and 20mg  (2 pill) on day 9 and day 10, 10mg  (1 pill) on day 11 and day 12  Light stretching to improve mobility. May alternate heat and ice to help with symptoms.  Make sure to stay hydrated by drinking plenty of water. Return to urgent care or PCP if symptoms worsen or fail to resolve.

## 2025-01-14 NOTE — ED Triage Notes (Signed)
 Patient c/o mid back pain for several days.  History of back pain but this is different.  The pain will catch if she moves the wrong way.  Patient has taken Oxycodone .

## 2025-01-17 ENCOUNTER — Encounter: Payer: Self-pay | Admitting: Physician Assistant

## 2025-01-17 MED ORDER — CYCLOBENZAPRINE HCL 10 MG PO TABS: 10.0000 mg | ORAL_TABLET | Freq: Three times a day (TID) | ORAL | 1 refills | Status: AC | PRN

## 2025-01-18 ENCOUNTER — Other Ambulatory Visit: Payer: Self-pay | Admitting: Physician Assistant

## 2025-01-18 NOTE — Telephone Encounter (Signed)
 Request received for rx rf of Atrovastatin 20mg   Last written 06/23/2024 Last OV 12/07/2024 Last Lipid panel 09/02/2023 Upcoming appt 03/07/2025

## 2025-01-19 ENCOUNTER — Ambulatory Visit (INDEPENDENT_AMBULATORY_CARE_PROVIDER_SITE_OTHER): Payer: MEDICAID

## 2025-01-19 ENCOUNTER — Encounter: Payer: Self-pay | Admitting: Professional

## 2025-01-19 ENCOUNTER — Ambulatory Visit: Payer: MEDICAID | Admitting: Professional

## 2025-01-19 VITALS — BP 100/70 | Ht 67.0 in | Wt 173.0 lb

## 2025-01-19 DIAGNOSIS — F319 Bipolar disorder, unspecified: Secondary | ICD-10-CM | POA: Diagnosis not present

## 2025-01-19 DIAGNOSIS — F411 Generalized anxiety disorder: Secondary | ICD-10-CM

## 2025-01-19 DIAGNOSIS — M47816 Spondylosis without myelopathy or radiculopathy, lumbar region: Secondary | ICD-10-CM

## 2025-01-19 DIAGNOSIS — M51362 Other intervertebral disc degeneration, lumbar region with discogenic back pain and lower extremity pain: Secondary | ICD-10-CM

## 2025-01-19 DIAGNOSIS — F900 Attention-deficit hyperactivity disorder, predominantly inattentive type: Secondary | ICD-10-CM

## 2025-01-19 DIAGNOSIS — F603 Borderline personality disorder: Secondary | ICD-10-CM

## 2025-01-19 NOTE — Progress Notes (Addendum)
"                                                                                                                                                                                                                                                                                                                                                                                                                                                                                                                                                                                                                                                                                                                                                                                                                                                                                                                                                                                                                                                                                                                                                                                                                                                                                                         ° °  Subjective:    Patient ID: Frances [name], female    DOB: 49 y.o., 1976-02-03   MRN: [id number]  Chief Complaint: Continued low back pain  History of Present Illness Frances [name] reports that she has not heard back from [facility] imaging regarding the epidural steroid injections that we have been planning for her low back. She has a history of epidural steroid injections in the past which did provide significant relief for the radicular portion of her back pain Went to urgent care on 01/14/2025 for worsening of the pain around her back Now feels like the radicular portion of her back pain is now returning Took Vicodin which they prescribed for her at urgent  care Currently taking prednisone  Stopped diclofenac  while taking prednisone  Feels like pain is still just as prominent     Objective:   Vitals:   01/19/25 1347  BP: 100/70    Lumbar Spine -Inspection: no swelling or skin changes -Palpation: TTP - midline, + paraspinals (right side) -AROM/PROM: FROM in all planes of the low back -Strength: full hip flexion (L1/L2), knee extension (L3/4), ankle dorsiflexion (L4/5), hip extension (L5/S1), knee flexion (L5/S1/S2) plantarflexion (S1/2). -Special tests: - Straight Leg Raise, + Stork, - Slump test      Assessment & Plan:   Assessment & Plan Frances [name] continues to experience significant pain in her back with a recurrence of right sided radiculopathy.  She has good strength on my exam today and I am not concerned for cauda equina syndrome.  We will reach back out to [facility] imaging regarding her status of getting scheduled for an epidural steroid injection.  I am okay with her reinitiating the oral diclofenac  while taking the last couple days of her prednisone  course.  We will follow-up with her regarding next steps for epidural steroid injection.                               "

## 2025-01-19 NOTE — Progress Notes (Signed)
 "  [facility] Counselor/Therapist Progress Note  Patient ID: Frances [name], MRN: [id number],    Date: 01/19/2025  Time Spent: 51 minutes 1104-1155am   Treatment Type: Individual Therapy  Risk Assessment: Danger to Self:  No Self-injurious Behavior: No Danger to Others: No  Subjective: The patient arrived early for her in-person appt  Issues addressed: 1-mood -pleasant and easily engaged -reports increased back pain 3-physical -still awaiting epidural appt -plans to contact MD to determine why she has not received appt 4-EMDR a-pt reports she found first session helpful -admits that therapy has become less stressful for her citing she was leaving feeling worse but that has now changed b-reviewed container, restoration team, and calm/peaceful place c-completed anchoring exercise -pt got first two kittens in 7th grade, little white fur balls Sprite and Jazz -brother wanted one in his basement room and she would not permit -began her lifelong love for cats and animals in general -began taking horseback riding lessons shortly thereafter d-walking patient through memory -not wanting to go out with Father (and brother/sister) to [employer] but having no choice e-identifying negative/positive cognitions -educated on Subjective Unit of Distress (SUD) and Validity of Cognition (VOC) -pt identified negative cognition as I am worthless and her positive as I am worthy and worthwhile -positive cognition initial scores SUDS-9 (0 not bothersome)- 10 (extremely bothersome) VOC-2 (1 completely false- 7 completely true)  Treatment Plan Problems: Depression, Financial Stress, Grief /Loss Unresolved, Medical Issues Symptoms: A diagnosis of a chronic illness that is not life-threatening but necessitates changes in living. A long-term lack of discipline in money management that has led to excessive indebtedness. A feeling of low self-esteem and hopelessness  that is associated with the lack of sufficient income to cover the cost of living. Loss of income due to unemployment. Strong emotional response of sadness exhibited when losses are discussed. Serial losses in life (i.e., deaths, divorces, jobs) that led to depression and discouragement. Sad affect, social withdrawal, anxiety, loss of interest in activities, and low energy. History of chronic or recurrent depression for which the client has taken antidepressant medication, been hospitalized, had outpatient treatment, or had a course of electroconvulsive therapy. Poor concentration and indecisiveness. Sleeplessness or hypersomnia. Low self-esteem. Lack of energy. Diminished interest in or enjoyment of activities. Depressed or irritable mood. Avoidance of talking on anything more than a superficial level about the loss. Unresolved grief issues. Lack of appetite, weight loss, and/or insomnia as well as other depression signs that occurred since the loss. Decrease or loss of appetite. Goals: Accept emotional support from those who care, without pushing them away in anger. Achieve an inner strength to control personal impulses, cravings, and desires that directly or indirectly increase debt irresponsibly. Begin a healthy grieving process around the loss. Develop an awareness of how the avoidance of grieving has affected life and begin the healing process. Gain a new sense of self-worth in which the substance of one's value is not attached to the capacity to do things or own things that cost money. Live life to the fullest extent possible, even though remaining time may be limited. Recognize, accept, and cope with feelings of depression. Resolve financial crisis with a path to eliminate debt. Work through the grieving process and face with peace the reality of own death. Objectives target date for all objectives is 09/08/2025: Identify the losses or limitations that have been experienced due to  the medical condition. Identify feelings associated with the medical condition. Work with therapist to  develop a plan for coping with stress. Learn and implement skills for managing stress. Implement mindfulness techniques for relapse prevention. Identify and replace thoughts and beliefs that support depression. Learn and implement behavioral strategies to overcome depression. Identify and voice positives about the deceased loved one including previous positive experiences, positive characteristics, positive aspects of the relationship, and how these things may be remembered. Set financial goals and make budgetary decisions with partner, allowing for equal input and balanced control over financial matters. Write a budget that balances income with expenses. Identify priorities that should control how money is spent. Begin verbalizing feelings associated with the loss. Identify how avoiding dealing with loss has negatively impacted life. Identify what stages of grief have been experienced in the continuum of the grieving process. Describe history, symptoms, and treatment of the medical condition. Interventions: Review the client's budget as to reasonableness and completeness. Reinforce changes in managing money that reflect compromise, responsible planning, and respectful cooperation with the client's partner. Ask the client to list the priorities that he/she believes should give direction to how his/her money is spent; process those priorities. Review the client's spending history to discover what priorities and values have misdirected spending. Ask the client to list ways that avoidance of grieving has negatively impacted his/her life. Ask the client to discuss and/or list the positive aspects of and memories about his/her relationship with the lost loved one; reinforce the client's expression of positive memories and emotions (e.g., smiling, laughing); encourage the client to share these  thoughts with supportive loved ones. Educate the client on the stages of the grieving process and answer any questions he/she may have. Assist the client in identifying the stages of grief that he/she has experienced and which stage he/she is presently working through. Ask the client to bring pictures or mementos connected with his/her loss to a session and talk about them (or assign Creating a Patent Examiner in the Adult Psychotherapy Administrator, Arts by Jenniffer). Assist the client in identifying and expressing feelings connected with his/her loss. In a collaborative fashion, develop a therapeutic alliance while gathering a history of the condition, including symptoms, client's reactions to the diagnosis, treatments of the condition, and prognosis. Assist the client in developing a tailored coping action plan for preventing and/or managing identified stressful reactions using skills such as relaxation, exercise, cognitive reframing, and problem-solving. Conduct skills training, building upon effective coping strategies the client possesses, and teaching new skills tailored to the specific stressor. Assist the client in identifying, sorting through, and verbalizing the various feelings generated by his/her medical condition. Ask the client to list the changes, losses, or limitations that have resulted from the medical condition (or assign The Impact of My Illness in the Adult Psychotherapy Homework Planner by Jenniffer). Assign the client to self-monitor thoughts, feelings, and actions in daily journal (e.g., Negative Thoughts Trigger Negative Feelings in the Adult Psychotherapy Homework Planner by Jenniffer; Daily Record of Dysfunctional Thoughts in Cognitive Therapy of Depression by Almarie Candida Gentry and Shona); process the journal material to challenge depressive thinking patterns and replace them with reality-based thoughts. Assign behavioral experiments in which depressive automatic thoughts  are treated as hypotheses/prediction, reality-based alternative hypotheses/prediction are generated, and both are tested against the client's past, present, and/or future experiences. Assist the client in developing skills that increase the likelihood of deriving pleasure from behavioral activation (e.g., assertiveness skills, developing an exercise plan, less internal/more external focus, increased social involvement); reinforce success. Use mindfulness meditation and cognitive therapy techniques to help the  client learn to recognize and regulate the negative thought processes associated with depression and to change his/her relationship with these thoughts (see Mindfulness-Based Cognitive Therapy for Depression by Kriste Pouch, and Jil). Work to increase the client's new sense of well-being by building his/her personal strengths evident in their progress through therapy (or assign Acknowledging My Strengths and/or What Are My Good Qualities? in the Adult Psychotherapy Homework Planner by Jenniffer).  Diagnosis:Bipolar I disorder (HCC)  Generalized anxiety disorder  Attention deficit hyperactivity disorder (ADHD), predominantly inattentive type  Borderline personality disorder [facility])  Plan:  -meet again on Friday, January 27, 2025 at 11am in person   "

## 2025-01-26 ENCOUNTER — Other Ambulatory Visit: Payer: Self-pay

## 2025-01-26 NOTE — Discharge Instructions (Signed)

## 2025-01-27 ENCOUNTER — Encounter: Payer: Self-pay | Admitting: Professional

## 2025-01-27 ENCOUNTER — Ambulatory Visit: Payer: MEDICAID | Admitting: Professional

## 2025-01-27 ENCOUNTER — Inpatient Hospital Stay: Admission: RE | Admit: 2025-01-27 | Payer: MEDICAID

## 2025-01-27 DIAGNOSIS — F900 Attention-deficit hyperactivity disorder, predominantly inattentive type: Secondary | ICD-10-CM

## 2025-01-27 DIAGNOSIS — F319 Bipolar disorder, unspecified: Secondary | ICD-10-CM

## 2025-01-27 DIAGNOSIS — F411 Generalized anxiety disorder: Secondary | ICD-10-CM

## 2025-01-27 NOTE — Progress Notes (Signed)
 "  [facility] Counselor/Therapist Progress Note  Patient ID: Frances [name], MRN: [id number],    Date: 01/27/2025  Time Spent: 53 minutes 1107-12pm   Treatment Type: Individual Therapy  Risk Assessment: Danger to Self:  No Self-injurious Behavior: No Danger to Others: No  Subjective: The patient arrived early for her in-person appt  Issues addressed: 1-mood -pleasant and easily engaged -reports increased back pain 3-physical -has not had any pain since our last visit -she had her shoulder evaluated for surgery and decided she doesn't need since she is not experiencing any pain 4-EMDR a-pt reports she has found sessions helpful b-walking patient through memory -not wanting to go out with Father (and brother/sister) to [employer] but having no choice c-identifying negative/positive cognitions -educated on Subjective Unit of Distress (SUD) and Validity of Cognition (VOC) -pt identified negative cognition as I am worthless and her positive as I am worthy and worthwhile -positive cognition scores SUDS-7 (0 not bothersome)- 10 (extremely bothersome), scared of the situation 7, satisfied, wanting to leave that situation 7, relaxed, 6, scared of accepting that she is worthy and worthwhile 6, increased time: pt measures at safe and 5, working to get stomach to feel better still a 5, stomach more settled 5, I'm settling with it even though pt gives herself a 5, 4, not grinding my teeth as much when thinking about it, 4, I was able to stay relaxed 3, I'm still staying relaxed 3, started thinking about the animals that people leave me with and the large farm that the only leave with me 2, trying to keep clenching my jaw but still a 2, I was able to stay calm the entire time 2 VOC-3 (1 completely false- 7 completely true), 3, 4, 3, 4, 7 believes x6,  -session ended with pt believing that she is worthy and worthwhile completely true; her distress level  related to her positive cognition was a 2 (o completely false) and pt was able to stay calm the entire time  Treatment Plan Problems: Depression, Financial Stress, Grief /Loss Unresolved, Medical Issues Symptoms: A diagnosis of a chronic illness that is not life-threatening but necessitates changes in living. A long-term lack of discipline in money management that has led to excessive indebtedness. A feeling of low self-esteem and hopelessness that is associated with the lack of sufficient income to cover the cost of living. Loss of income due to unemployment. Strong emotional response of sadness exhibited when losses are discussed. Serial losses in life (i.e., deaths, divorces, jobs) that led to depression and discouragement. Sad affect, social withdrawal, anxiety, loss of interest in activities, and low energy. History of chronic or recurrent depression for which the client has taken antidepressant medication, been hospitalized, had outpatient treatment, or had a course of electroconvulsive therapy. Poor concentration and indecisiveness. Sleeplessness or hypersomnia. Low self-esteem. Lack of energy. Diminished interest in or enjoyment of activities. Depressed or irritable mood. Avoidance of talking on anything more than a superficial level about the loss. Unresolved grief issues. Lack of appetite, weight loss, and/or insomnia as well as other depression signs that occurred since the loss. Decrease or loss of appetite. Goals: Accept emotional support from those who care, without pushing them away in anger. Achieve an inner strength to control personal impulses, cravings, and desires that directly or indirectly increase debt irresponsibly. Begin a healthy grieving process around the loss. Develop an awareness of how the avoidance of grieving has affected life and begin the healing process. Gain a new  sense of self-worth in which the substance of one's value is not attached to the capacity  to do things or own things that cost money. Live life to the fullest extent possible, even though remaining time may be limited. Recognize, accept, and cope with feelings of depression. Resolve financial crisis with a path to eliminate debt. Work through the grieving process and face with peace the reality of own death. Objectives target date for all objectives is 09/08/2025: Identify the losses or limitations that have been experienced due to the medical condition. Identify feelings associated with the medical condition. Work with therapist to develop a plan for coping with stress. Learn and implement skills for managing stress. Implement mindfulness techniques for relapse prevention. Identify and replace thoughts and beliefs that support depression. Learn and implement behavioral strategies to overcome depression. Identify and voice positives about the deceased loved one including previous positive experiences, positive characteristics, positive aspects of the relationship, and how these things may be remembered. Set financial goals and make budgetary decisions with partner, allowing for equal input and balanced control over financial matters. Write a budget that balances income with expenses. Identify priorities that should control how money is spent. Begin verbalizing feelings associated with the loss. Identify how avoiding dealing with loss has negatively impacted life. Identify what stages of grief have been experienced in the continuum of the grieving process. Describe history, symptoms, and treatment of the medical condition. Interventions: Review the client's budget as to reasonableness and completeness. Reinforce changes in managing money that reflect compromise, responsible planning, and respectful cooperation with the client's partner. Ask the client to list the priorities that he/she believes should give direction to how his/her money is spent; process those priorities. Review  the client's spending history to discover what priorities and values have misdirected spending. Ask the client to list ways that avoidance of grieving has negatively impacted his/her life. Ask the client to discuss and/or list the positive aspects of and memories about his/her relationship with the lost loved one; reinforce the client's expression of positive memories and emotions (e.g., smiling, laughing); encourage the client to share these thoughts with supportive loved ones. Educate the client on the stages of the grieving process and answer any questions he/she may have. Assist the client in identifying the stages of grief that he/she has experienced and which stage he/she is presently working through. Ask the client to bring pictures or mementos connected with his/her loss to a session and talk about them (or assign Creating a Patent Examiner in the Adult Psychotherapy Administrator, Arts by Jenniffer). Assist the client in identifying and expressing feelings connected with his/her loss. In a collaborative fashion, develop a therapeutic alliance while gathering a history of the condition, including symptoms, client's reactions to the diagnosis, treatments of the condition, and prognosis. Assist the client in developing a tailored coping action plan for preventing and/or managing identified stressful reactions using skills such as relaxation, exercise, cognitive reframing, and problem-solving. Conduct skills training, building upon effective coping strategies the client possesses, and teaching new skills tailored to the specific stressor. Assist the client in identifying, sorting through, and verbalizing the various feelings generated by his/her medical condition. Ask the client to list the changes, losses, or limitations that have resulted from the medical condition (or assign The Impact of My Illness in the Adult Psychotherapy Homework Planner by Jenniffer). Assign the client to self-monitor  thoughts, feelings, and actions in daily journal (e.g., Negative Thoughts Trigger Negative Feelings in the Adult Psychotherapy Homework Planner  by Jenniffer; Daily Record of Dysfunctional Thoughts in Cognitive Therapy of Depression by Almarie Candida Gentry and Shona); process the journal material to challenge depressive thinking patterns and replace them with reality-based thoughts. Assign behavioral experiments in which depressive automatic thoughts are treated as hypotheses/prediction, reality-based alternative hypotheses/prediction are generated, and both are tested against the client's past, present, and/or future experiences. Assist the client in developing skills that increase the likelihood of deriving pleasure from behavioral activation (e.g., assertiveness skills, developing an exercise plan, less internal/more external focus, increased social involvement); reinforce success. Use mindfulness meditation and cognitive therapy techniques to help the client learn to recognize and regulate the negative thought processes associated with depression and to change his/her relationship with these thoughts (see Mindfulness-Based Cognitive Therapy for Depression by Kriste Pouch, and Jil). Work to increase the client's new sense of well-being by building his/her personal strengths evident in their progress through therapy (or assign Acknowledging My Strengths and/or What Are My Good Qualities? in the Adult Psychotherapy Homework Planner by Jenniffer).  Diagnosis:Bipolar I disorder (HCC)  Generalized anxiety disorder  Attention deficit hyperactivity disorder (ADHD), predominantly inattentive type  Plan:  -pt is to be more mindful when thinking she is not worthy or worthwhile and interrupt the thought -she is to recite the positive cognition intentionally throughout the entire week -suggested pt ready The Body Keeps the Score -meet again on Friday, February 03, 2025 at 11am in person   "

## 2025-02-03 ENCOUNTER — Ambulatory Visit: Payer: MEDICAID | Admitting: Professional

## 2025-02-10 ENCOUNTER — Ambulatory Visit: Payer: MEDICAID | Admitting: Professional

## 2025-02-10 ENCOUNTER — Other Ambulatory Visit: Payer: MEDICAID

## 2025-02-17 ENCOUNTER — Ambulatory Visit: Payer: MEDICAID | Admitting: Professional

## 2025-02-24 ENCOUNTER — Ambulatory Visit: Payer: MEDICAID | Admitting: Professional

## 2025-03-03 ENCOUNTER — Ambulatory Visit: Payer: MEDICAID | Admitting: Professional

## 2025-03-07 ENCOUNTER — Ambulatory Visit: Payer: MEDICAID | Admitting: Physician Assistant

## 2025-03-10 ENCOUNTER — Ambulatory Visit: Payer: MEDICAID | Admitting: Professional

## 2025-03-15 ENCOUNTER — Telehealth (HOSPITAL_COMMUNITY): Payer: MEDICAID | Admitting: Psychiatry

## 2025-03-16 ENCOUNTER — Ambulatory Visit: Payer: MEDICAID | Admitting: Professional

## 2025-03-23 ENCOUNTER — Ambulatory Visit: Payer: MEDICAID | Admitting: Professional
